# Patient Record
Sex: Male | Born: 1965 | Race: Black or African American | Hispanic: No | Marital: Single | Smoking: Never smoker
Health system: Southern US, Community
[De-identification: ages and names within clinical notes are randomized; demographics above are authoritative.]

## PROBLEM LIST (undated history)

## (undated) DIAGNOSIS — Z7901 Long term (current) use of anticoagulants: Secondary | ICD-10-CM

## (undated) DIAGNOSIS — IMO0001 Reserved for inherently not codable concepts without codable children: Secondary | ICD-10-CM

## (undated) DIAGNOSIS — N186 End stage renal disease: Secondary | ICD-10-CM

## (undated) DIAGNOSIS — E1165 Type 2 diabetes mellitus with hyperglycemia: Secondary | ICD-10-CM

## (undated) DIAGNOSIS — Z87442 Personal history of urinary calculi: Secondary | ICD-10-CM

## (undated) DIAGNOSIS — N200 Calculus of kidney: Secondary | ICD-10-CM

## (undated) DIAGNOSIS — I517 Cardiomegaly: Secondary | ICD-10-CM

## (undated) DIAGNOSIS — K219 Gastro-esophageal reflux disease without esophagitis: Secondary | ICD-10-CM

## (undated) DIAGNOSIS — T827XXA Infection and inflammatory reaction due to other cardiac and vascular devices, implants and grafts, initial encounter: Secondary | ICD-10-CM

## (undated) DIAGNOSIS — N2581 Secondary hyperparathyroidism of renal origin: Secondary | ICD-10-CM

## (undated) DIAGNOSIS — R4020 Unspecified coma: Secondary | ICD-10-CM

## (undated) DIAGNOSIS — M109 Gout, unspecified: Secondary | ICD-10-CM

## (undated) DIAGNOSIS — J189 Pneumonia, unspecified organism: Secondary | ICD-10-CM

## (undated) DIAGNOSIS — L02413 Cutaneous abscess of right upper limb: Secondary | ICD-10-CM

## (undated) DIAGNOSIS — F419 Anxiety disorder, unspecified: Secondary | ICD-10-CM

## (undated) DIAGNOSIS — R519 Headache, unspecified: Secondary | ICD-10-CM

## (undated) DIAGNOSIS — I82409 Acute embolism and thrombosis of unspecified deep veins of unspecified lower extremity: Secondary | ICD-10-CM

## (undated) DIAGNOSIS — D649 Anemia, unspecified: Secondary | ICD-10-CM

## (undated) DIAGNOSIS — IMO0002 Reserved for concepts with insufficient information to code with codable children: Secondary | ICD-10-CM

## (undated) DIAGNOSIS — R51 Headache: Secondary | ICD-10-CM

## (undated) DIAGNOSIS — G4733 Obstructive sleep apnea (adult) (pediatric): Secondary | ICD-10-CM

## (undated) DIAGNOSIS — K861 Other chronic pancreatitis: Secondary | ICD-10-CM

## (undated) DIAGNOSIS — A419 Sepsis, unspecified organism: Secondary | ICD-10-CM

## (undated) DIAGNOSIS — R6521 Severe sepsis with septic shock: Secondary | ICD-10-CM

## (undated) DIAGNOSIS — I10 Essential (primary) hypertension: Secondary | ICD-10-CM

## (undated) HISTORY — PX: AV FISTULA PLACEMENT: SHX1204

## (undated) HISTORY — DX: Morbid (severe) obesity due to excess calories: E66.01

## (undated) HISTORY — DX: Gout, unspecified: M10.9

## (undated) HISTORY — DX: End stage renal disease: N18.6

## (undated) HISTORY — DX: Essential (primary) hypertension: I10

## (undated) HISTORY — DX: Unspecified coma: R40.20

## (undated) HISTORY — DX: Sepsis, unspecified organism: A41.9

## (undated) HISTORY — DX: Anemia, unspecified: D64.9

## (undated) HISTORY — DX: Other chronic pancreatitis: K86.1

## (undated) HISTORY — PX: AMPUTATION FINGER / THUMB: SUR24

## (undated) HISTORY — DX: Secondary hyperparathyroidism of renal origin: N25.81

## (undated) HISTORY — DX: Severe sepsis with septic shock: R65.21

## (undated) HISTORY — DX: Long term (current) use of anticoagulants: Z79.01

## (undated) HISTORY — DX: Cardiomegaly: I51.7

## (undated) HISTORY — DX: Obstructive sleep apnea (adult) (pediatric): G47.33

## (undated) HISTORY — DX: Acute embolism and thrombosis of unspecified deep veins of unspecified lower extremity: I82.409

---

## 2009-10-05 DIAGNOSIS — I82409 Acute embolism and thrombosis of unspecified deep veins of unspecified lower extremity: Secondary | ICD-10-CM

## 2009-10-05 HISTORY — DX: Acute embolism and thrombosis of unspecified deep veins of unspecified lower extremity: I82.409

## 2009-11-01 ENCOUNTER — Ambulatory Visit: Payer: Self-pay | Admitting: Internal Medicine

## 2009-11-01 ENCOUNTER — Inpatient Hospital Stay (HOSPITAL_COMMUNITY): Admission: EM | Admit: 2009-11-01 | Discharge: 2009-12-21 | Payer: Self-pay | Admitting: Emergency Medicine

## 2009-11-04 ENCOUNTER — Ambulatory Visit: Payer: Self-pay | Admitting: Vascular Surgery

## 2009-11-04 ENCOUNTER — Encounter (INDEPENDENT_AMBULATORY_CARE_PROVIDER_SITE_OTHER): Payer: Self-pay | Admitting: Pulmonary Disease

## 2009-11-05 ENCOUNTER — Encounter: Payer: Self-pay | Admitting: Emergency Medicine

## 2009-11-27 ENCOUNTER — Encounter (INDEPENDENT_AMBULATORY_CARE_PROVIDER_SITE_OTHER): Payer: Self-pay | Admitting: Pulmonary Disease

## 2009-11-29 ENCOUNTER — Ambulatory Visit: Payer: Self-pay | Admitting: Physical Medicine & Rehabilitation

## 2009-12-04 ENCOUNTER — Ambulatory Visit: Payer: Self-pay | Admitting: Internal Medicine

## 2009-12-20 ENCOUNTER — Encounter: Payer: Self-pay | Admitting: Family Medicine

## 2009-12-20 ENCOUNTER — Ambulatory Visit: Payer: Self-pay | Admitting: Vascular Surgery

## 2010-01-07 ENCOUNTER — Encounter (INDEPENDENT_AMBULATORY_CARE_PROVIDER_SITE_OTHER): Payer: Self-pay | Admitting: Internal Medicine

## 2010-01-07 ENCOUNTER — Ambulatory Visit: Payer: Self-pay | Admitting: Vascular Surgery

## 2010-01-07 ENCOUNTER — Ambulatory Visit: Payer: Self-pay | Admitting: Pulmonary Disease

## 2010-01-07 ENCOUNTER — Ambulatory Visit: Payer: Self-pay | Admitting: Hematology and Oncology

## 2010-01-07 ENCOUNTER — Inpatient Hospital Stay (HOSPITAL_COMMUNITY): Admission: EM | Admit: 2010-01-07 | Discharge: 2010-01-14 | Payer: Self-pay | Admitting: Emergency Medicine

## 2010-01-12 ENCOUNTER — Encounter: Payer: Self-pay | Admitting: Vascular Surgery

## 2010-01-14 ENCOUNTER — Ambulatory Visit: Payer: Self-pay | Admitting: Hematology and Oncology

## 2010-02-07 ENCOUNTER — Ambulatory Visit (HOSPITAL_COMMUNITY)
Admission: RE | Admit: 2010-02-07 | Discharge: 2010-02-07 | Payer: Self-pay | Source: Home / Self Care | Admitting: Vascular Surgery

## 2010-02-07 ENCOUNTER — Ambulatory Visit: Payer: Self-pay | Admitting: Vascular Surgery

## 2010-03-10 ENCOUNTER — Ambulatory Visit: Payer: Self-pay | Admitting: Hematology and Oncology

## 2010-03-25 ENCOUNTER — Ambulatory Visit: Payer: Self-pay | Admitting: Vascular Surgery

## 2010-04-09 ENCOUNTER — Encounter (INDEPENDENT_AMBULATORY_CARE_PROVIDER_SITE_OTHER): Payer: Self-pay | Admitting: Nurse Practitioner

## 2010-04-10 ENCOUNTER — Encounter (HOSPITAL_BASED_OUTPATIENT_CLINIC_OR_DEPARTMENT_OTHER): Admission: RE | Admit: 2010-04-10 | Discharge: 2010-05-29 | Payer: Self-pay | Admitting: General Surgery

## 2010-05-26 ENCOUNTER — Ambulatory Visit: Payer: Self-pay | Admitting: Vascular Surgery

## 2010-05-26 ENCOUNTER — Ambulatory Visit (HOSPITAL_COMMUNITY): Admission: RE | Admit: 2010-05-26 | Discharge: 2010-05-26 | Payer: Self-pay | Admitting: Vascular Surgery

## 2010-06-06 ENCOUNTER — Encounter: Admission: RE | Admit: 2010-06-06 | Discharge: 2010-07-02 | Payer: Self-pay | Admitting: Orthopedic Surgery

## 2010-06-30 ENCOUNTER — Emergency Department (HOSPITAL_COMMUNITY): Admission: EM | Admit: 2010-06-30 | Discharge: 2010-06-30 | Payer: Self-pay | Admitting: Emergency Medicine

## 2010-07-07 ENCOUNTER — Emergency Department (HOSPITAL_COMMUNITY): Admission: EM | Admit: 2010-07-07 | Discharge: 2010-07-07 | Payer: Self-pay | Admitting: Emergency Medicine

## 2010-07-09 ENCOUNTER — Encounter: Admission: RE | Admit: 2010-07-09 | Discharge: 2010-08-26 | Payer: Self-pay | Admitting: Orthopedic Surgery

## 2010-07-23 ENCOUNTER — Ambulatory Visit (HOSPITAL_COMMUNITY): Admission: RE | Admit: 2010-07-23 | Discharge: 2010-07-23 | Payer: Self-pay | Admitting: Nephrology

## 2010-07-28 ENCOUNTER — Ambulatory Visit (HOSPITAL_COMMUNITY): Admission: RE | Admit: 2010-07-28 | Discharge: 2010-07-28 | Payer: Self-pay | Admitting: Nephrology

## 2010-08-22 ENCOUNTER — Ambulatory Visit (HOSPITAL_COMMUNITY)
Admission: RE | Admit: 2010-08-22 | Discharge: 2010-08-22 | Payer: Self-pay | Source: Home / Self Care | Admitting: Nephrology

## 2010-08-26 ENCOUNTER — Ambulatory Visit (HOSPITAL_COMMUNITY)
Admission: RE | Admit: 2010-08-26 | Discharge: 2010-08-26 | Payer: Self-pay | Source: Home / Self Care | Admitting: Nephrology

## 2010-11-03 IMAGING — CR DG CHEST 1V PORT
1 series · 1 of 1 positions shown · non-contrast
Comparison: 11/13/2009.

CLINICAL DATA: Respiratory distress.

PORTABLE CHEST - 1 VIEW

[AP]
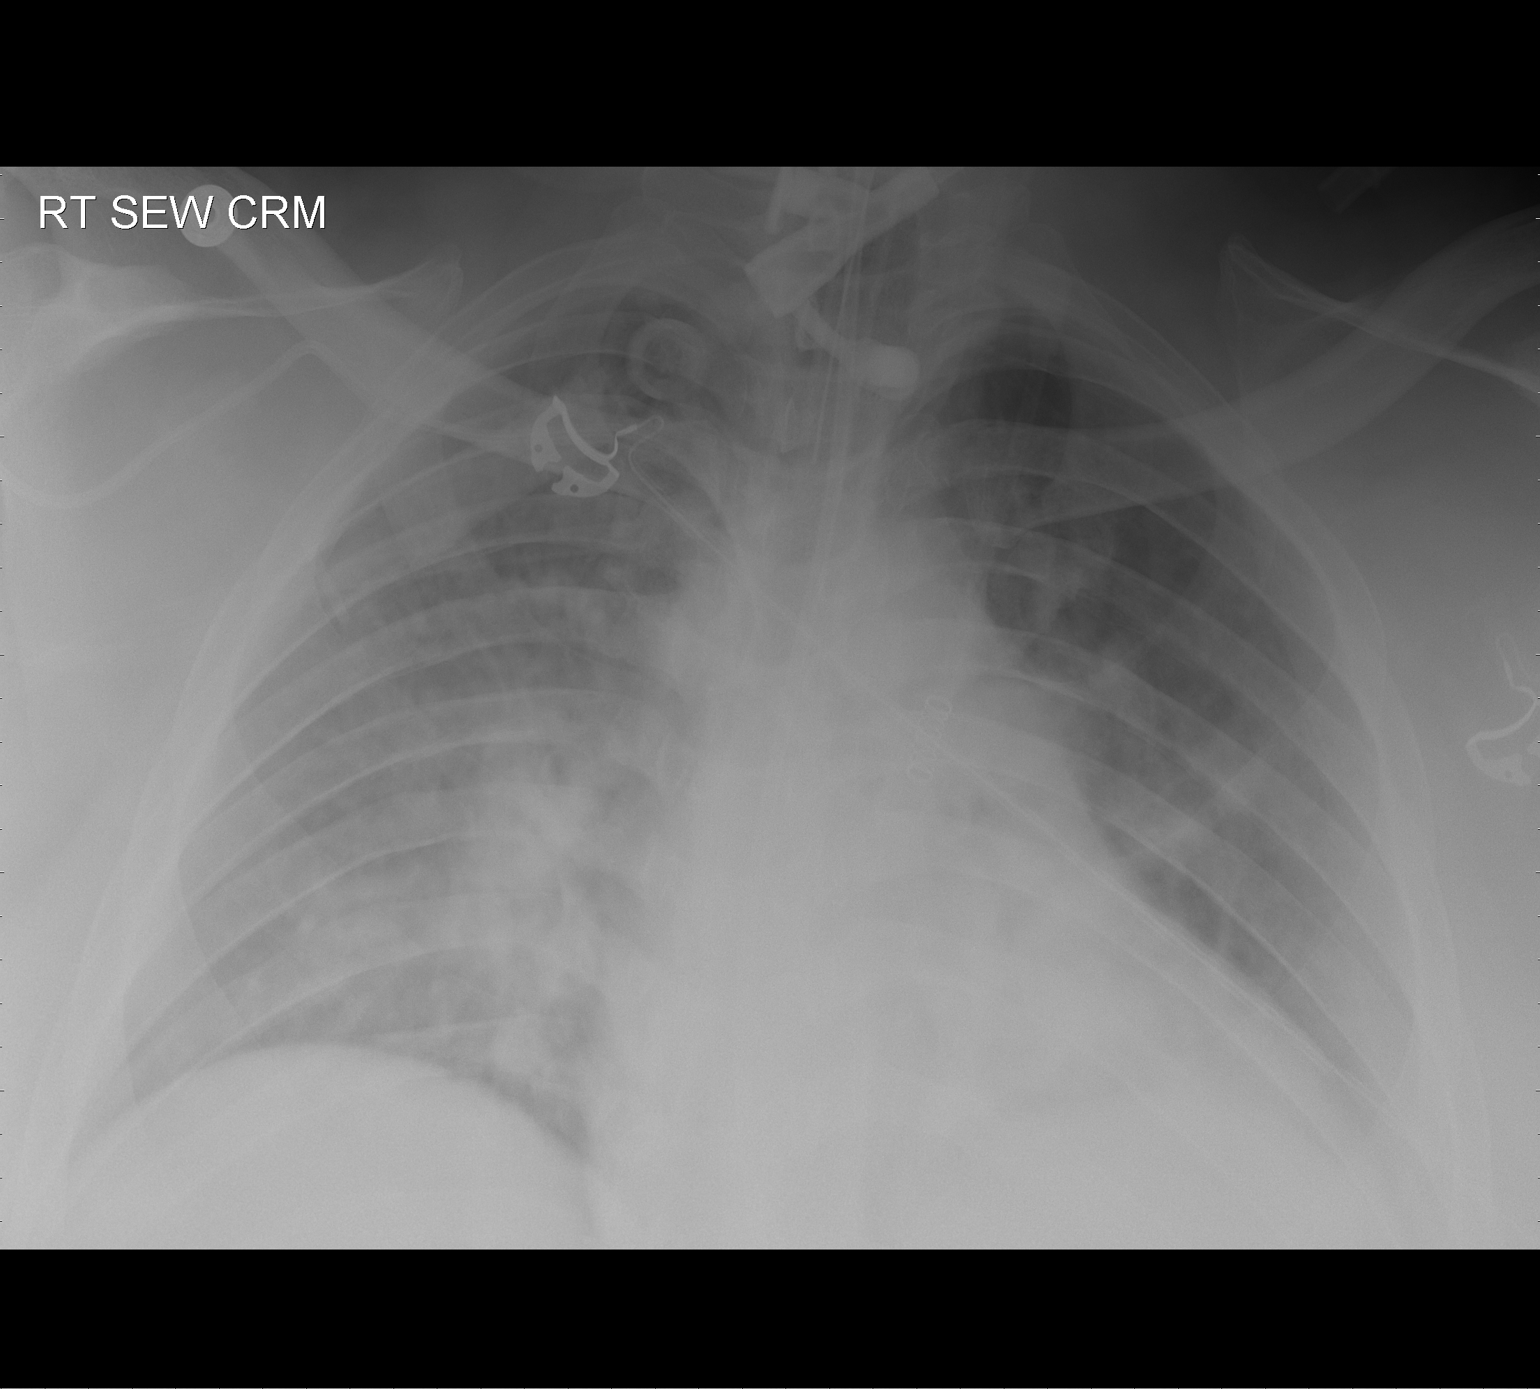

[1 of 1 positions shown; findings below may reference images not displayed]

FINDINGS: Subsegmental atelectasis left midlung zone.  Suspicion
for diffuse airspace disease.  Atelectatic changes right lower lung
zone.

ET tube position satisfactory.  Panda feeding tube is noted
traversing the esophagus.
IMPRESSION: Bilateral atelectatic changes.

## 2010-11-04 IMAGING — CR DG CHEST 1V PORT
1 series · 1 of 1 positions shown · non-contrast
Comparison: T10

CLINICAL DATA: Respiratory arrest.  Tracheostomy.

PORTABLE CHEST - 1 VIEW

[AP]
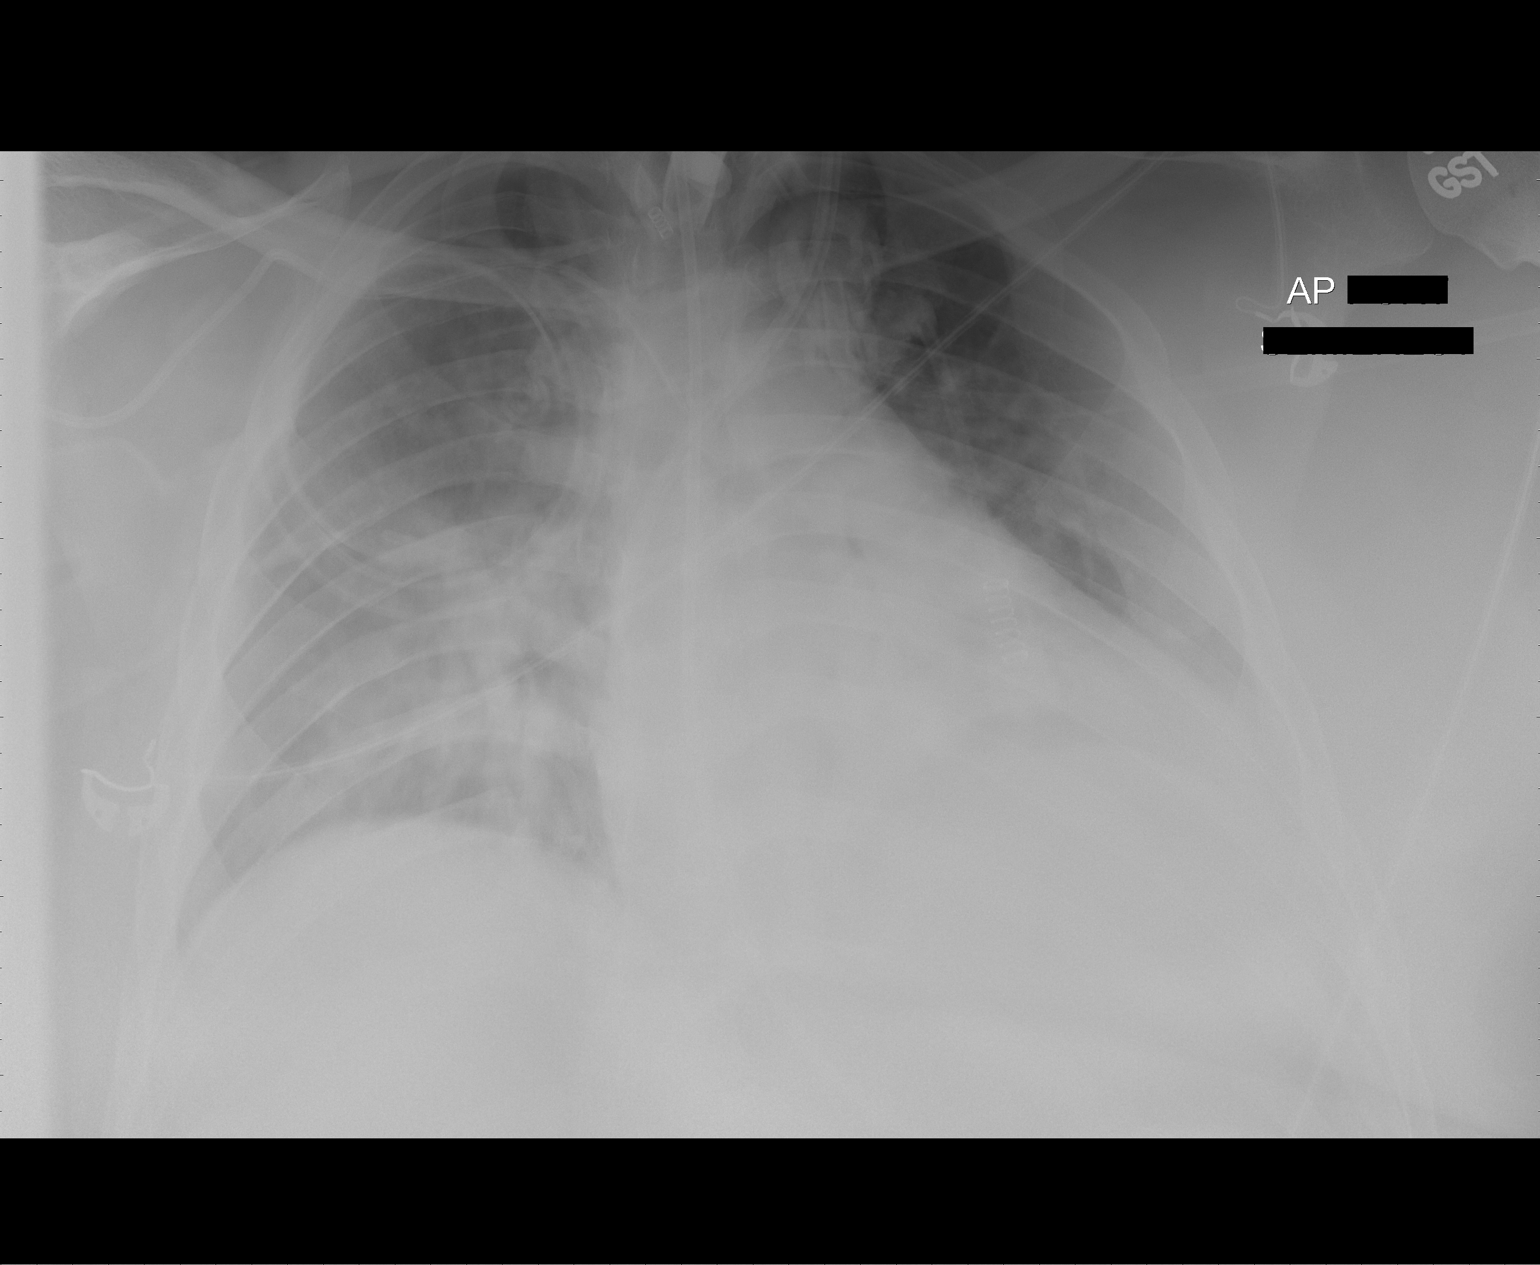

[1 of 1 positions shown; findings below may reference images not displayed]

FINDINGS: Tracheostomy, soft feeding tube and left internal jugular
central line appear the same.  Right subclavian central line is
also unchanged with its tip in the SVC at the azygos level.  The
heart remains enlarged.  Diffuse pulmonary airspace density
persists.  This is little changed.  There may be slightly more
volume loss in the left lower lobe.
IMPRESSION: Persistent areas of pulmonary density bilaterally, little changed
except for possible slight increase in volume loss in the left
lower lobe.

## 2010-11-04 NOTE — Letter (Signed)
Summary: PLAN OF CARE 12/22/09 TO  02/19/10  PLAN OF CARE 12/22/09 TO  02/19/10   Imported By: Silvio Pate Stanislawscyk 04/09/2010 15:17:55  _____________________________________________________________________  External Attachment:    Type:   Image     Comment:   External Document

## 2010-11-06 IMAGING — CR DG CHEST 1V PORT
1 series · 1 of 1 positions shown · non-contrast
Comparison: Earlier film, same date.

CLINICAL DATA: Respiratory arrest.  Central line placement.

PORTABLE CHEST - 1 VIEW

[AP]
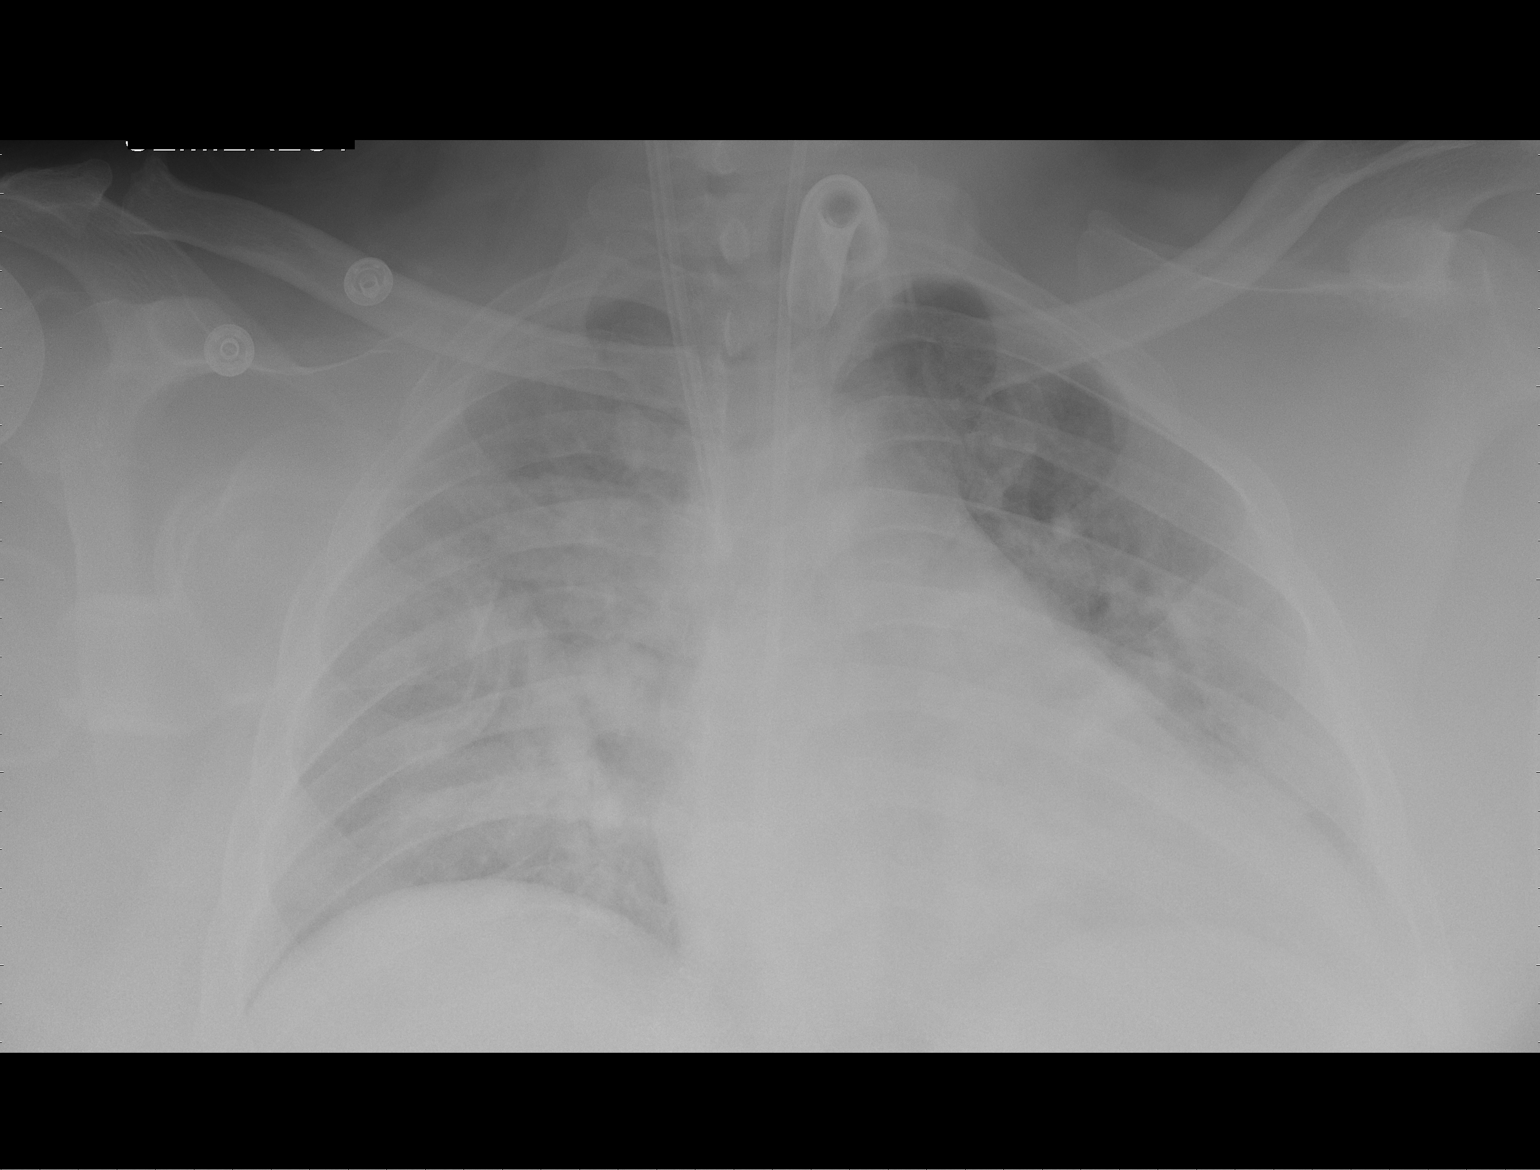

[1 of 1 positions shown; findings below may reference images not displayed]

FINDINGS: The tracheostomy tube and Panda tube are stable.  There
is a new right IJ central venous catheter with its tip in the mid
SVC.  No complicating features are demonstrated.  The heart lungs
are stable with a persistent diffuse airspace process.
IMPRESSION: 1.  Right IJ central venous catheter in good position without
complicating features.
2.  Stable appearance of the heart and lungs since the earlier
film.

## 2010-11-08 IMAGING — CR DG CHEST 1V PORT
1 series · 1 of 1 positions shown · non-contrast
Comparison: Chest radiograph 11/18/2009

CLINICAL DATA: Respiratory arrest

PORTABLE CHEST - 1 VIEW

[AP]
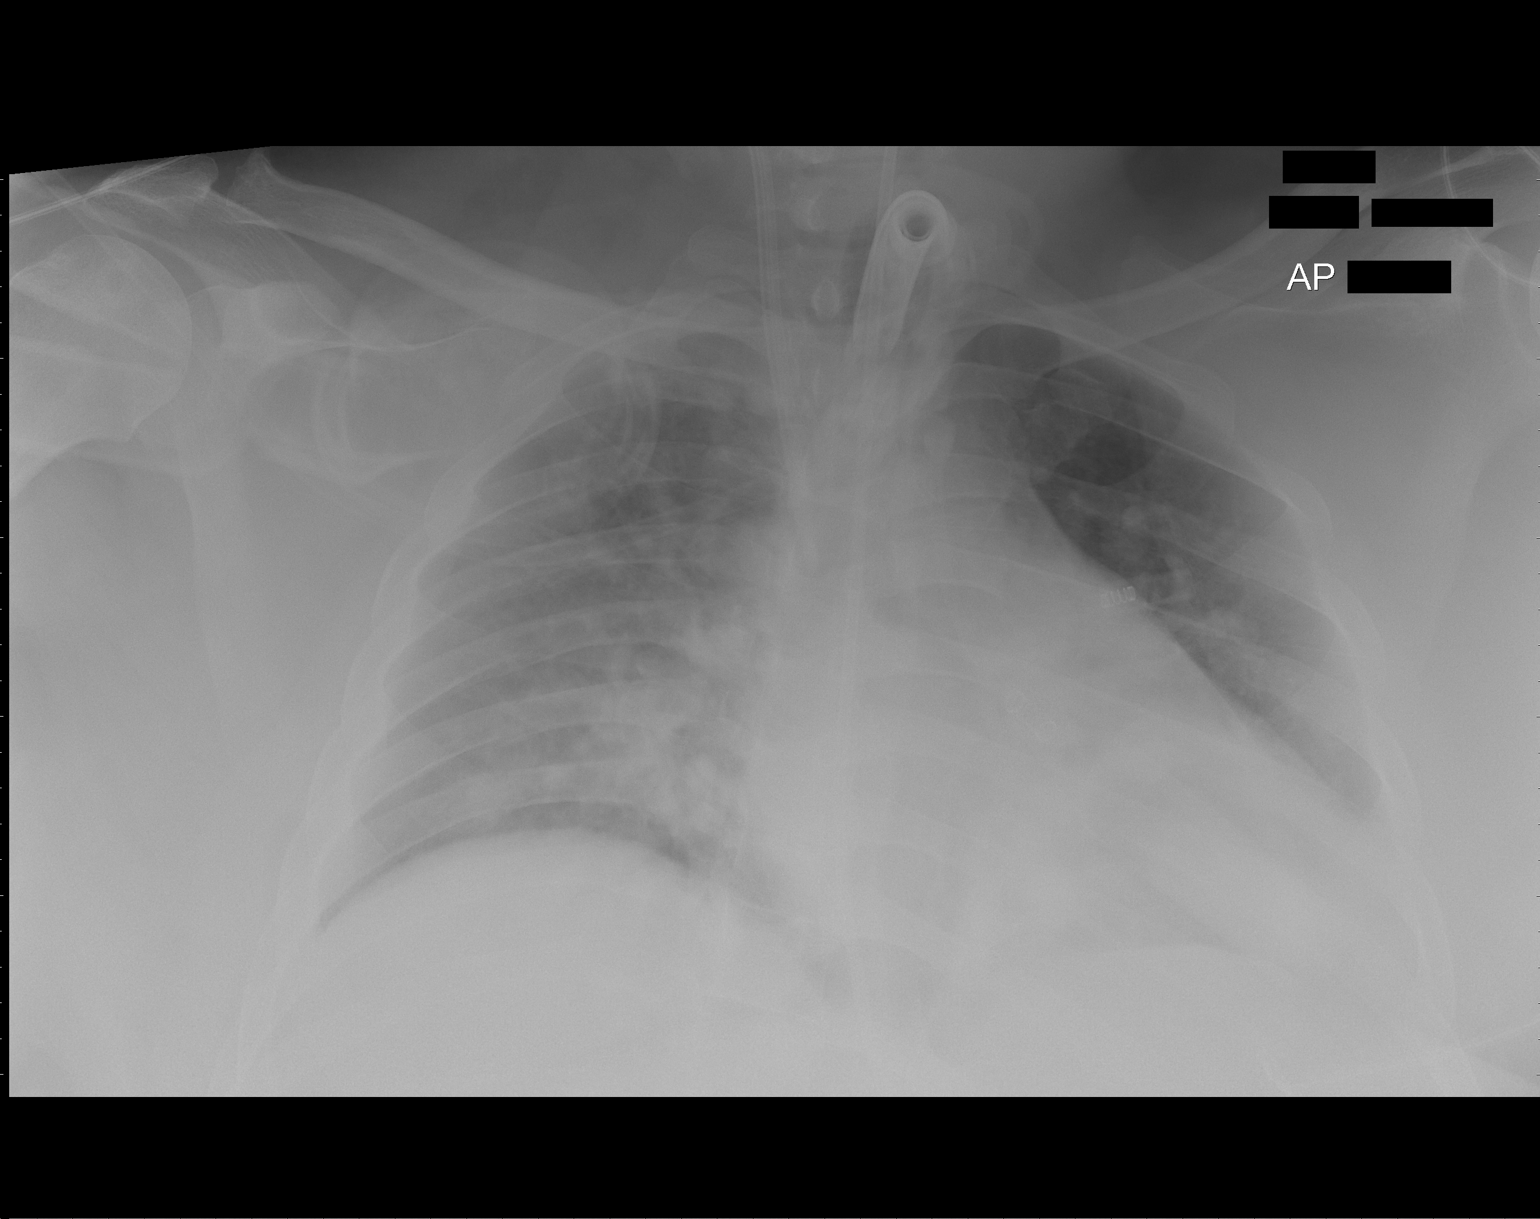

[1 of 1 positions shown; findings below may reference images not displayed]

FINDINGS: Endotracheal tube, feeding tube, and right central venous
line are unchanged.  Stable enlarged cardiac silhouette.  The
perihilar air space opacities are unchanged from prior.
IMPRESSION: 1.  No significant change.

2.  Stable support apparatus.
3.  Cardiomegaly and mild pulmonary edema.

## 2010-11-11 IMAGING — CR DG CHEST 1V PORT
1 series · 1 of 1 positions shown · non-contrast
Comparison: 11/19/2009

CLINICAL DATA: Respiratory distress, tracheostomy.

PORTABLE CHEST - 1 VIEW

[view not recorded]
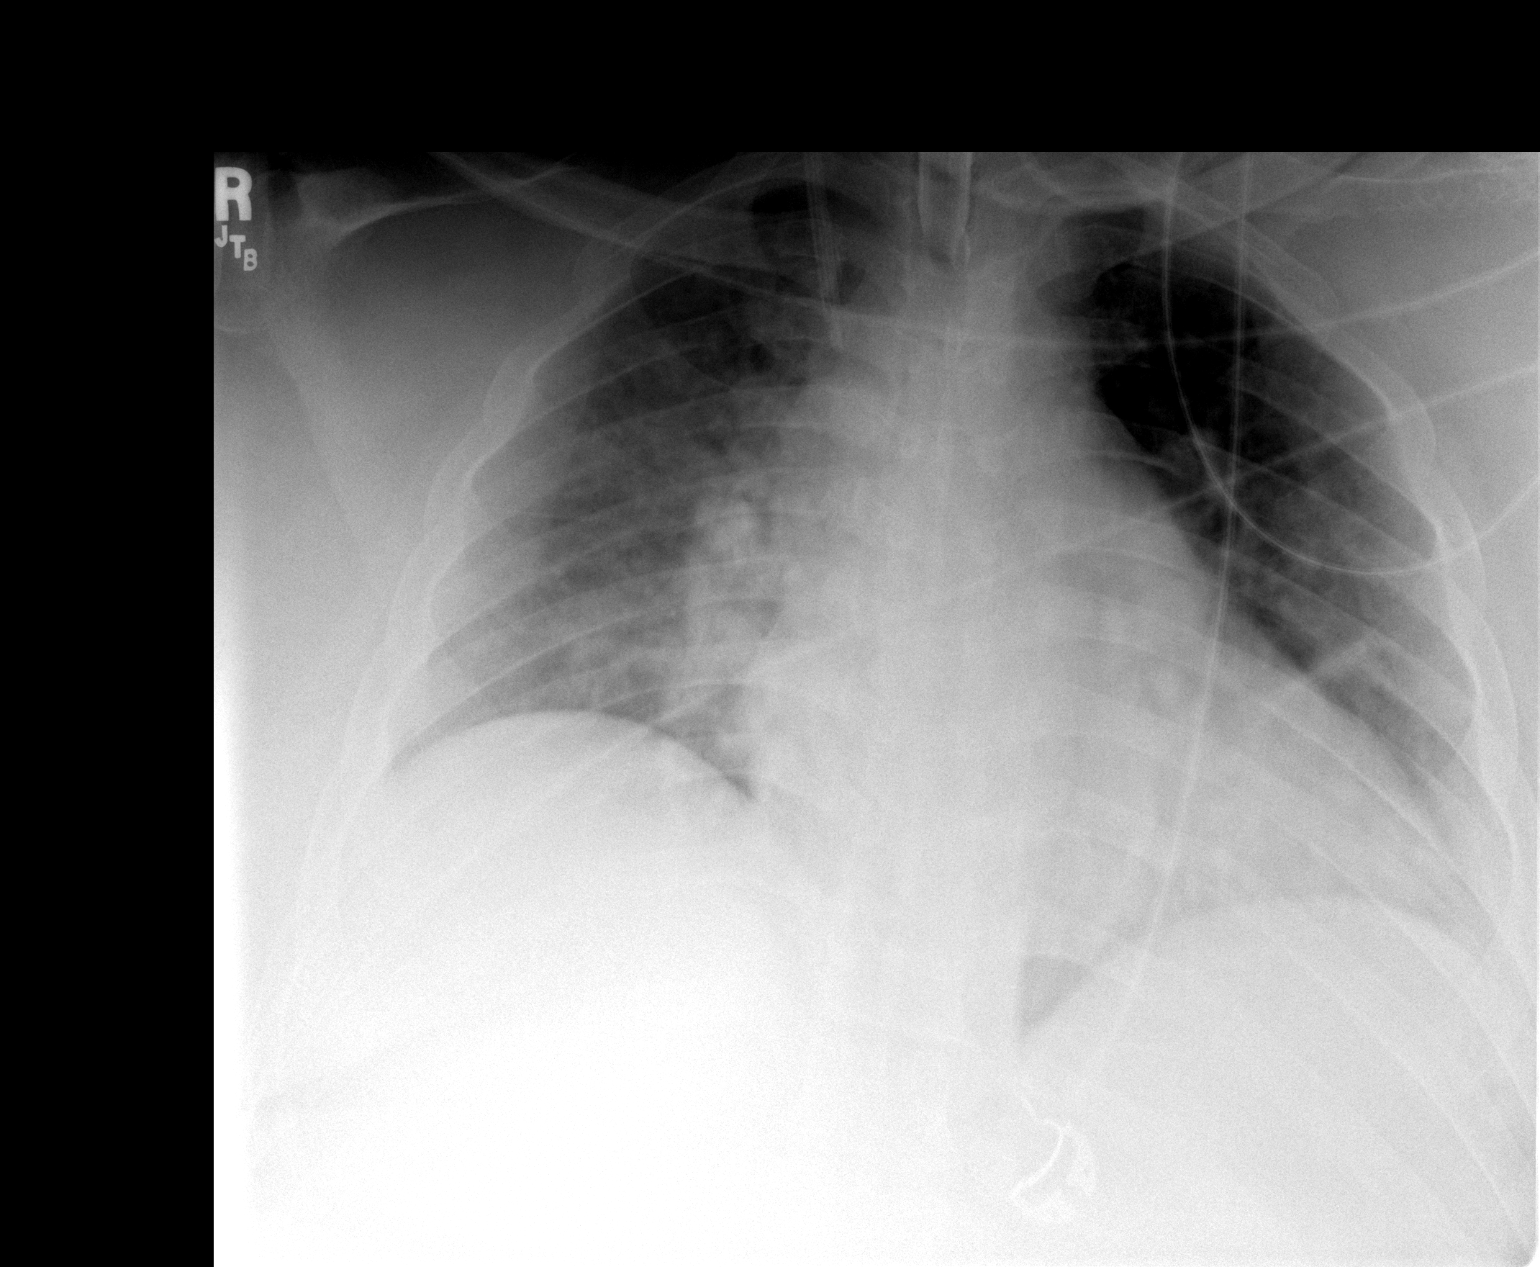

[1 of 1 positions shown; findings below may reference images not displayed]

FINDINGS: Image quality is degraded by motion.  Tracheostomy is
midline.  Right IJ catheter terminates at the junction of the right
internal jugular and right subclavian veins.  Feeding tube has been
removed.  Heart is enlarged.  Lungs are low in volume with mixed
interstitial and airspace disease, stable.
IMPRESSION: Low lung volumes with mixed interstitial and airspace disease,
which may be due to edema.

## 2010-11-21 ENCOUNTER — Other Ambulatory Visit (HOSPITAL_COMMUNITY): Payer: Self-pay | Admitting: Nephrology

## 2010-11-21 DIAGNOSIS — N186 End stage renal disease: Secondary | ICD-10-CM

## 2010-12-05 ENCOUNTER — Ambulatory Visit (HOSPITAL_COMMUNITY)
Admission: RE | Admit: 2010-12-05 | Discharge: 2010-12-05 | Disposition: A | Discharge status: Home / Self Care | Payer: Medicare Other | Source: Ambulatory Visit | Attending: Nephrology | Admitting: Nephrology

## 2010-12-05 ENCOUNTER — Other Ambulatory Visit (HOSPITAL_COMMUNITY): Payer: Self-pay | Admitting: Nephrology

## 2010-12-05 DIAGNOSIS — N186 End stage renal disease: Secondary | ICD-10-CM | POA: Insufficient documentation

## 2010-12-05 DIAGNOSIS — I871 Compression of vein: Secondary | ICD-10-CM | POA: Insufficient documentation

## 2010-12-05 DIAGNOSIS — T82898A Other specified complication of vascular prosthetic devices, implants and grafts, initial encounter: Secondary | ICD-10-CM | POA: Insufficient documentation

## 2010-12-05 DIAGNOSIS — Y832 Surgical operation with anastomosis, bypass or graft as the cause of abnormal reaction of the patient, or of later complication, without mention of misadventure at the time of the procedure: Secondary | ICD-10-CM | POA: Insufficient documentation

## 2010-12-05 MED ORDER — IOHEXOL 300 MG/ML  SOLN
100.0000 mL | Freq: Once | INTRAMUSCULAR | Status: AC | PRN
  Administered 2010-12-05: 50 mL via INTRAVENOUS

## 2010-12-11 ENCOUNTER — Other Ambulatory Visit (HOSPITAL_COMMUNITY): Payer: Self-pay | Admitting: Nephrology

## 2010-12-11 DIAGNOSIS — N186 End stage renal disease: Secondary | ICD-10-CM

## 2010-12-17 ENCOUNTER — Ambulatory Visit (HOSPITAL_COMMUNITY)
Admission: RE | Admit: 2010-12-17 | Discharge: 2010-12-17 | Disposition: A | Discharge status: Home / Self Care | Payer: Medicare Other | Source: Ambulatory Visit | Attending: Nephrology | Admitting: Nephrology

## 2010-12-17 ENCOUNTER — Other Ambulatory Visit (HOSPITAL_COMMUNITY): Payer: Self-pay | Admitting: Nephrology

## 2010-12-17 DIAGNOSIS — Y832 Surgical operation with anastomosis, bypass or graft as the cause of abnormal reaction of the patient, or of later complication, without mention of misadventure at the time of the procedure: Secondary | ICD-10-CM | POA: Insufficient documentation

## 2010-12-17 DIAGNOSIS — N186 End stage renal disease: Secondary | ICD-10-CM | POA: Insufficient documentation

## 2010-12-17 DIAGNOSIS — D649 Anemia, unspecified: Secondary | ICD-10-CM | POA: Insufficient documentation

## 2010-12-17 DIAGNOSIS — I120 Hypertensive chronic kidney disease with stage 5 chronic kidney disease or end stage renal disease: Secondary | ICD-10-CM | POA: Insufficient documentation

## 2010-12-17 DIAGNOSIS — E669 Obesity, unspecified: Secondary | ICD-10-CM | POA: Insufficient documentation

## 2010-12-17 DIAGNOSIS — T82898A Other specified complication of vascular prosthetic devices, implants and grafts, initial encounter: Secondary | ICD-10-CM | POA: Insufficient documentation

## 2010-12-17 DIAGNOSIS — E119 Type 2 diabetes mellitus without complications: Secondary | ICD-10-CM | POA: Insufficient documentation

## 2010-12-17 DIAGNOSIS — G4730 Sleep apnea, unspecified: Secondary | ICD-10-CM | POA: Insufficient documentation

## 2010-12-17 MED ORDER — IOHEXOL 300 MG/ML  SOLN
100.0000 mL | Freq: Once | INTRAMUSCULAR | Status: AC | PRN
  Administered 2010-12-17: 55 mL via INTRAVENOUS

## 2010-12-18 LAB — URINALYSIS, ROUTINE W REFLEX MICROSCOPIC
Bilirubin Urine: NEGATIVE
Leukocytes, UA: NEGATIVE
Nitrite: NEGATIVE
Protein, ur: 100 mg/dL — AB
Specific Gravity, Urine: 1.014 (ref 1.005–1.030)
pH: 7 (ref 5.0–8.0)

## 2010-12-18 LAB — CBC
Hemoglobin: 10.4 g/dL — ABNORMAL LOW (ref 13.0–17.0)
MCH: 31.4 pg (ref 26.0–34.0)
MCHC: 32.2 g/dL (ref 30.0–36.0)
MCV: 97.6 fL (ref 78.0–100.0)
RBC: 3.31 MIL/uL — ABNORMAL LOW (ref 4.22–5.81)

## 2010-12-18 LAB — BASIC METABOLIC PANEL
CO2: 27 mEq/L (ref 19–32)
Calcium: 9.2 mg/dL (ref 8.4–10.5)
Chloride: 103 mEq/L (ref 96–112)
Glucose, Bld: 95 mg/dL (ref 70–99)
Sodium: 140 mEq/L (ref 135–145)

## 2010-12-18 LAB — DIFFERENTIAL
Basophils Relative: 1 % (ref 0–1)
Eosinophils Absolute: 0.2 10*3/uL (ref 0.0–0.7)
Eosinophils Relative: 2 % (ref 0–5)
Lymphs Abs: 3.5 10*3/uL (ref 0.7–4.0)
Monocytes Absolute: 0.8 10*3/uL (ref 0.1–1.0)
Monocytes Relative: 7 % (ref 3–12)
Neutrophils Relative %: 59 % (ref 43–77)

## 2010-12-18 LAB — URINE MICROSCOPIC-ADD ON

## 2010-12-18 LAB — URINE CULTURE: Culture: NO GROWTH

## 2010-12-22 LAB — ACETAMINOPHEN LEVEL: Acetaminophen (Tylenol), Serum: <10 ug/mL — ABNORMAL LOW (ref 10–30)

## 2010-12-22 LAB — POCT I-STAT 3, ART BLOOD GAS (G3+)
Acid-Base Excess: 1 mmol/L (ref 0.0–2.0)
Acid-base deficit: 20 mmol/L — ABNORMAL HIGH (ref 0.0–2.0)
Acid-base deficit: 22 mmol/L — ABNORMAL HIGH (ref 0.0–2.0)
Acid-base deficit: 8 mmol/L — ABNORMAL HIGH (ref 0.0–2.0)
Bicarbonate: 17.9 mEq/L — ABNORMAL LOW (ref 20.0–24.0)
Bicarbonate: 18.5 mEq/L — ABNORMAL LOW (ref 20.0–24.0)
Bicarbonate: 19.8 mEq/L — ABNORMAL LOW (ref 20.0–24.0)
Bicarbonate: 23.8 mEq/L (ref 20.0–24.0)
Bicarbonate: 26 mEq/L — ABNORMAL HIGH (ref 20.0–24.0)
O2 Saturation: 84 %
O2 Saturation: 95 %
Patient temperature: 35.6
Patient temperature: 98.6
Patient temperature: 98.6
TCO2: 20 mmol/L (ref 0–100)
TCO2: 21 mmol/L (ref 0–100)
TCO2: 21 mmol/L (ref 0–100)
pCO2 arterial: 39.2 mmHg (ref 35.0–45.0)
pCO2 arterial: 41.1 mmHg (ref 35.0–45.0)
pCO2 arterial: 41.9 mmHg (ref 35.0–45.0)
pCO2 arterial: 43.2 mmHg (ref 35.0–45.0)
pCO2 arterial: 46.4 mmHg — ABNORMAL HIGH (ref 35.0–45.0)
pH, Arterial: 6.886 — CL (ref 7.350–7.450)
pH, Arterial: 7.135 — CL (ref 7.350–7.450)
pH, Arterial: 7.314 — ABNORMAL LOW (ref 7.350–7.450)
pH, Arterial: 7.318 — ABNORMAL LOW (ref 7.350–7.450)
pH, Arterial: 7.394 (ref 7.350–7.450)
pO2, Arterial: 101 mmHg — ABNORMAL HIGH (ref 80.0–100.0)
pO2, Arterial: 127 mmHg — ABNORMAL HIGH (ref 80.0–100.0)
pO2, Arterial: 199 mmHg — ABNORMAL HIGH (ref 80.0–100.0)
pO2, Arterial: 73 mmHg — ABNORMAL LOW (ref 80.0–100.0)
pO2, Arterial: 88 mmHg (ref 80.0–100.0)

## 2010-12-22 LAB — CBC
HCT: 35.4 % — ABNORMAL LOW (ref 39.0–52.0)
HCT: 37.2 % — ABNORMAL LOW (ref 39.0–52.0)
HCT: 40 % (ref 39.0–52.0)
Hemoglobin: 11.6 g/dL — ABNORMAL LOW (ref 13.0–17.0)
Hemoglobin: 12.3 g/dL — ABNORMAL LOW (ref 13.0–17.0)
Hemoglobin: 13.6 g/dL (ref 13.0–17.0)
MCHC: 28.5 g/dL — ABNORMAL LOW (ref 30.0–36.0)
MCHC: 32.4 g/dL (ref 30.0–36.0)
MCHC: 33 g/dL (ref 30.0–36.0)
MCV: 96.4 fL (ref 78.0–100.0)
Platelets: 138 10*3/uL — ABNORMAL LOW (ref 150–400)
Platelets: 53 10*3/uL — ABNORMAL LOW (ref 150–400)
RBC: 4.25 MIL/uL (ref 4.22–5.81)
RDW: 15.1 % (ref 11.5–15.5)
RDW: 15.8 % — ABNORMAL HIGH (ref 11.5–15.5)
RDW: 15.8 % — ABNORMAL HIGH (ref 11.5–15.5)

## 2010-12-22 LAB — POCT I-STAT, CHEM 8
Calcium, Ion: 1.12 mmol/L (ref 1.12–1.32)
Chloride: 119 mEq/L — ABNORMAL HIGH (ref 96–112)
Creatinine, Ser: 5 mg/dL — ABNORMAL HIGH (ref 0.4–1.5)
Glucose, Bld: >700 mg/dL (ref 70–99)
Potassium: 5.7 mEq/L — ABNORMAL HIGH (ref 3.5–5.1)

## 2010-12-22 LAB — CARDIAC PANEL(CRET KIN+CKTOT+MB+TROPI)
CK, MB: 7.4 ng/mL (ref 0.3–4.0)
CK, MB: 8.7 ng/mL (ref 0.3–4.0)
CK, MB: 9.9 ng/mL (ref 0.3–4.0)
Relative Index: 0.3 (ref 0.0–2.5)
Relative Index: 0.6 (ref 0.0–2.5)
Total CK: 1341 U/L — ABNORMAL HIGH (ref 7–232)
Total CK: 649 U/L — ABNORMAL HIGH (ref 7–232)
Troponin I: 0.1 ng/mL — ABNORMAL HIGH (ref 0.00–0.06)
Troponin I: 0.1 ng/mL — ABNORMAL HIGH (ref 0.00–0.06)

## 2010-12-22 LAB — RAPID URINE DRUG SCREEN, HOSP PERFORMED
Amphetamines: NOT DETECTED
Opiates: NOT DETECTED
Tetrahydrocannabinol: NOT DETECTED

## 2010-12-22 LAB — SALICYLATE LEVEL: Salicylate Lvl: <4 mg/dL (ref 2.8–20.0)

## 2010-12-22 LAB — D-DIMER, QUANTITATIVE: D-Dimer, Quant: 6.28 ug/mL-FEU — ABNORMAL HIGH (ref 0.00–0.48)

## 2010-12-22 LAB — GLUCOSE, CAPILLARY
Glucose-Capillary: 118 mg/dL — ABNORMAL HIGH (ref 70–99)
Glucose-Capillary: 121 mg/dL — ABNORMAL HIGH (ref 70–99)
Glucose-Capillary: 131 mg/dL — ABNORMAL HIGH (ref 70–99)
Glucose-Capillary: 142 mg/dL — ABNORMAL HIGH (ref 70–99)
Glucose-Capillary: 142 mg/dL — ABNORMAL HIGH (ref 70–99)
Glucose-Capillary: 147 mg/dL — ABNORMAL HIGH (ref 70–99)
Glucose-Capillary: 150 mg/dL — ABNORMAL HIGH (ref 70–99)
Glucose-Capillary: 151 mg/dL — ABNORMAL HIGH (ref 70–99)
Glucose-Capillary: 153 mg/dL — ABNORMAL HIGH (ref 70–99)
Glucose-Capillary: 156 mg/dL — ABNORMAL HIGH (ref 70–99)
Glucose-Capillary: 159 mg/dL — ABNORMAL HIGH (ref 70–99)
Glucose-Capillary: 160 mg/dL — ABNORMAL HIGH (ref 70–99)
Glucose-Capillary: 163 mg/dL — ABNORMAL HIGH (ref 70–99)
Glucose-Capillary: 163 mg/dL — ABNORMAL HIGH (ref 70–99)
Glucose-Capillary: 164 mg/dL — ABNORMAL HIGH (ref 70–99)
Glucose-Capillary: 164 mg/dL — ABNORMAL HIGH (ref 70–99)
Glucose-Capillary: 165 mg/dL — ABNORMAL HIGH (ref 70–99)
Glucose-Capillary: 167 mg/dL — ABNORMAL HIGH (ref 70–99)
Glucose-Capillary: 170 mg/dL — ABNORMAL HIGH (ref 70–99)
Glucose-Capillary: 172 mg/dL — ABNORMAL HIGH (ref 70–99)
Glucose-Capillary: 177 mg/dL — ABNORMAL HIGH (ref 70–99)
Glucose-Capillary: 184 mg/dL — ABNORMAL HIGH (ref 70–99)
Glucose-Capillary: 184 mg/dL — ABNORMAL HIGH (ref 70–99)
Glucose-Capillary: 197 mg/dL — ABNORMAL HIGH (ref 70–99)
Glucose-Capillary: 210 mg/dL — ABNORMAL HIGH (ref 70–99)
Glucose-Capillary: 213 mg/dL — ABNORMAL HIGH (ref 70–99)
Glucose-Capillary: 214 mg/dL — ABNORMAL HIGH (ref 70–99)
Glucose-Capillary: 216 mg/dL — ABNORMAL HIGH (ref 70–99)
Glucose-Capillary: 251 mg/dL — ABNORMAL HIGH (ref 70–99)
Glucose-Capillary: 258 mg/dL — ABNORMAL HIGH (ref 70–99)
Glucose-Capillary: 280 mg/dL — ABNORMAL HIGH (ref 70–99)
Glucose-Capillary: 296 mg/dL — ABNORMAL HIGH (ref 70–99)
Glucose-Capillary: 337 mg/dL — ABNORMAL HIGH (ref 70–99)
Glucose-Capillary: 499 mg/dL — ABNORMAL HIGH (ref 70–99)
Glucose-Capillary: 507 mg/dL — ABNORMAL HIGH (ref 70–99)
Glucose-Capillary: 544 mg/dL — ABNORMAL HIGH (ref 70–99)
Glucose-Capillary: >600 mg/dL (ref 70–99)
Glucose-Capillary: >600 mg/dL (ref 70–99)
Glucose-Capillary: >600 mg/dL (ref 70–99)
Glucose-Capillary: >600 mg/dL (ref 70–99)

## 2010-12-22 LAB — CARBOXYHEMOGLOBIN
Carboxyhemoglobin: 1.7 % — ABNORMAL HIGH (ref 0.5–1.5)
O2 Saturation: 90 %
Total hemoglobin: 12.8 g/dL — ABNORMAL LOW (ref 13.5–18.0)

## 2010-12-22 LAB — BASIC METABOLIC PANEL
BUN: 74 mg/dL — ABNORMAL HIGH (ref 6–23)
BUN: 74 mg/dL — ABNORMAL HIGH (ref 6–23)
BUN: 76 mg/dL — ABNORMAL HIGH (ref 6–23)
BUN: 78 mg/dL — ABNORMAL HIGH (ref 6–23)
CO2: 10 mEq/L — ABNORMAL LOW (ref 19–32)
CO2: 18 mEq/L — ABNORMAL LOW (ref 19–32)
CO2: 19 mEq/L (ref 19–32)
Calcium: 7.1 mg/dL — ABNORMAL LOW (ref 8.4–10.5)
Calcium: 7.5 mg/dL — ABNORMAL LOW (ref 8.4–10.5)
Calcium: 8 mg/dL — ABNORMAL LOW (ref 8.4–10.5)
Chloride: 117 mEq/L — ABNORMAL HIGH (ref 96–112)
Chloride: 118 mEq/L — ABNORMAL HIGH (ref 96–112)
Chloride: 126 mEq/L — ABNORMAL HIGH (ref 96–112)
Creatinine, Ser: 5.76 mg/dL — ABNORMAL HIGH (ref 0.4–1.5)
Creatinine, Ser: 6.47 mg/dL — ABNORMAL HIGH (ref 0.4–1.5)
Creatinine, Ser: 6.68 mg/dL — ABNORMAL HIGH (ref 0.4–1.5)
Creatinine, Ser: 8.34 mg/dL — ABNORMAL HIGH (ref 0.4–1.5)
GFR calc Af Amer: 10 mL/min — ABNORMAL LOW (ref >60)
GFR calc Af Amer: 11 mL/min — ABNORMAL LOW (ref >60)
GFR calc Af Amer: 11 mL/min — ABNORMAL LOW (ref >60)
GFR calc Af Amer: 13 mL/min — ABNORMAL LOW (ref >60)
GFR calc Af Amer: 9 mL/min — ABNORMAL LOW (ref >60)
GFR calc non Af Amer: 10 mL/min — ABNORMAL LOW (ref >60)
GFR calc non Af Amer: 11 mL/min — ABNORMAL LOW (ref >60)
GFR calc non Af Amer: 7 mL/min — ABNORMAL LOW (ref >60)
GFR calc non Af Amer: 7 mL/min — ABNORMAL LOW (ref >60)
GFR calc non Af Amer: 9 mL/min — ABNORMAL LOW (ref >60)
Glucose, Bld: 1237 mg/dL (ref 70–99)
Glucose, Bld: 160 mg/dL — ABNORMAL HIGH (ref 70–99)
Glucose, Bld: 241 mg/dL — ABNORMAL HIGH (ref 70–99)
Glucose, Bld: 637 mg/dL (ref 70–99)
Potassium: 4.2 mEq/L (ref 3.5–5.1)
Potassium: 4.3 mEq/L (ref 3.5–5.1)
Potassium: 4.4 mEq/L (ref 3.5–5.1)
Sodium: 133 mEq/L — ABNORMAL LOW (ref 135–145)
Sodium: 138 mEq/L (ref 135–145)
Sodium: 139 mEq/L (ref 135–145)
Sodium: 150 mEq/L — ABNORMAL HIGH (ref 135–145)
Sodium: 151 mEq/L — ABNORMAL HIGH (ref 135–145)

## 2010-12-22 LAB — RENAL FUNCTION PANEL
Albumin: 2.3 g/dL — ABNORMAL LOW (ref 3.5–5.2)
Albumin: 2.4 g/dL — ABNORMAL LOW (ref 3.5–5.2)
Albumin: 2.5 g/dL — ABNORMAL LOW (ref 3.5–5.2)
BUN: 64 mg/dL — ABNORMAL HIGH (ref 6–23)
BUN: 68 mg/dL — ABNORMAL HIGH (ref 6–23)
BUN: 75 mg/dL — ABNORMAL HIGH (ref 6–23)
CO2: 20 mEq/L (ref 19–32)
CO2: 27 mEq/L (ref 19–32)
Calcium: 7.4 mg/dL — ABNORMAL LOW (ref 8.4–10.5)
Chloride: 116 mEq/L — ABNORMAL HIGH (ref 96–112)
Chloride: 127 mEq/L — ABNORMAL HIGH (ref 96–112)
Chloride: 99 mEq/L (ref 96–112)
Creatinine, Ser: 8.4 mg/dL — ABNORMAL HIGH (ref 0.4–1.5)
Glucose, Bld: 162 mg/dL — ABNORMAL HIGH (ref 70–99)
Glucose, Bld: 163 mg/dL — ABNORMAL HIGH (ref 70–99)
Glucose, Bld: 187 mg/dL — ABNORMAL HIGH (ref 70–99)
Phosphorus: 1.6 mg/dL — ABNORMAL LOW (ref 2.3–4.6)
Phosphorus: 5.9 mg/dL — ABNORMAL HIGH (ref 2.3–4.6)
Potassium: 3.8 mEq/L (ref 3.5–5.1)
Potassium: 4.4 mEq/L (ref 3.5–5.1)
Sodium: 139 mEq/L (ref 135–145)
Sodium: 158 mEq/L — ABNORMAL HIGH (ref 135–145)

## 2010-12-22 LAB — DIFFERENTIAL
Basophils Absolute: 0 10*3/uL (ref 0.0–0.1)
Basophils Absolute: 0.2 10*3/uL — ABNORMAL HIGH (ref 0.0–0.1)
Eosinophils Absolute: 0 10*3/uL (ref 0.0–0.7)
Eosinophils Absolute: 0 10*3/uL (ref 0.0–0.7)
Eosinophils Relative: 0 % (ref 0–5)
Lymphocytes Relative: 9 % — ABNORMAL LOW (ref 12–46)
Lymphs Abs: 1.5 10*3/uL (ref 0.7–4.0)
Monocytes Relative: 9 % (ref 3–12)
Neutro Abs: 13.6 10*3/uL — ABNORMAL HIGH (ref 1.7–7.7)

## 2010-12-22 LAB — URINALYSIS, ROUTINE W REFLEX MICROSCOPIC
Leukocytes, UA: NEGATIVE
Nitrite: NEGATIVE
Specific Gravity, Urine: 1.03 (ref 1.005–1.030)
pH: 5 (ref 5.0–8.0)

## 2010-12-22 LAB — HEPATIC FUNCTION PANEL
AST: 23 U/L (ref 0–37)
Bilirubin, Direct: 0.1 mg/dL (ref 0.0–0.3)
Indirect Bilirubin: 1 mg/dL — ABNORMAL HIGH (ref 0.3–0.9)
Total Protein: 6.8 g/dL (ref 6.0–8.3)

## 2010-12-22 LAB — HEPARIN LEVEL (UNFRACTIONATED): Heparin Unfractionated: 0.53 IU/mL (ref 0.30–0.70)

## 2010-12-22 LAB — TRIGLYCERIDES: Triglycerides: 462 mg/dL — ABNORMAL HIGH (ref <150)

## 2010-12-22 LAB — CULTURE, BLOOD (ROUTINE X 2): Culture: NO GROWTH

## 2010-12-22 LAB — BRAIN NATRIURETIC PEPTIDE: Pro B Natriuretic peptide (BNP): <30 pg/mL (ref 0.0–100.0)

## 2010-12-22 LAB — CULTURE, BAL-QUANTITATIVE W GRAM STAIN

## 2010-12-22 LAB — PROTIME-INR: INR: 1.34 (ref 0.00–1.49)

## 2010-12-22 LAB — TSH: TSH: 1.603 u[IU]/mL (ref 0.350–4.500)

## 2010-12-22 LAB — HEPARIN INDUCED THROMBOCYTOPENIA PNL: Heparin Induced Plt Ab: NEGATIVE

## 2010-12-22 LAB — URINE MICROSCOPIC-ADD ON

## 2010-12-22 LAB — URINE CULTURE: Culture: NO GROWTH

## 2010-12-22 LAB — CK TOTAL AND CKMB (NOT AT ARMC)
CK, MB: 9 ng/mL (ref 0.3–4.0)
Relative Index: 1.6 (ref 0.0–2.5)
Total CK: 578 U/L — ABNORMAL HIGH (ref 7–232)

## 2010-12-22 LAB — KETONES, QUALITATIVE

## 2010-12-22 LAB — APTT
aPTT: 24 seconds (ref 24–37)
aPTT: 51 seconds — ABNORMAL HIGH (ref 24–37)

## 2010-12-22 LAB — LEGIONELLA ANTIGEN, URINE

## 2010-12-22 LAB — CORTISOL: Cortisol, Plasma: 32.9 ug/dL

## 2010-12-22 LAB — ETHANOL: Alcohol, Ethyl (B): <5 mg/dL (ref 0–10)

## 2010-12-22 LAB — LIPASE, BLOOD: Lipase: 2673 U/L — ABNORMAL HIGH (ref 11–59)

## 2010-12-22 LAB — AMYLASE: Amylase: 1434 U/L — ABNORMAL HIGH (ref 0–105)

## 2010-12-23 LAB — POCT I-STAT 4, (NA,K, GLUC, HGB,HCT)
Glucose, Bld: 131 mg/dL — ABNORMAL HIGH (ref 70–99)
HCT: 38 % — ABNORMAL LOW (ref 39.0–52.0)
Hemoglobin: 12.9 g/dL — ABNORMAL LOW (ref 13.0–17.0)

## 2010-12-23 LAB — GLUCOSE, CAPILLARY
Glucose-Capillary: 142 mg/dL — ABNORMAL HIGH (ref 70–99)
Glucose-Capillary: 152 mg/dL — ABNORMAL HIGH (ref 70–99)

## 2010-12-24 LAB — MRSA PCR SCREENING: MRSA by PCR: NEGATIVE

## 2010-12-24 LAB — URINE CULTURE: Colony Count: >=100000

## 2010-12-24 LAB — GLUCOSE, CAPILLARY
Glucose-Capillary: 101 mg/dL — ABNORMAL HIGH (ref 70–99)
Glucose-Capillary: 123 mg/dL — ABNORMAL HIGH (ref 70–99)
Glucose-Capillary: 124 mg/dL — ABNORMAL HIGH (ref 70–99)
Glucose-Capillary: 130 mg/dL — ABNORMAL HIGH (ref 70–99)
Glucose-Capillary: 133 mg/dL — ABNORMAL HIGH (ref 70–99)
Glucose-Capillary: 134 mg/dL — ABNORMAL HIGH (ref 70–99)
Glucose-Capillary: 139 mg/dL — ABNORMAL HIGH (ref 70–99)
Glucose-Capillary: 62 mg/dL — ABNORMAL LOW (ref 70–99)
Glucose-Capillary: 67 mg/dL — ABNORMAL LOW (ref 70–99)
Glucose-Capillary: 77 mg/dL (ref 70–99)
Glucose-Capillary: 79 mg/dL (ref 70–99)
Glucose-Capillary: 81 mg/dL (ref 70–99)
Glucose-Capillary: 93 mg/dL (ref 70–99)
Glucose-Capillary: 96 mg/dL (ref 70–99)
Glucose-Capillary: 98 mg/dL (ref 70–99)
Glucose-Capillary: 99 mg/dL (ref 70–99)

## 2010-12-24 LAB — CBC
HCT: 27.5 % — ABNORMAL LOW (ref 39.0–52.0)
HCT: 30.9 % — ABNORMAL LOW (ref 39.0–52.0)
Hemoglobin: 10 g/dL — ABNORMAL LOW (ref 13.0–17.0)
Hemoglobin: 10.1 g/dL — ABNORMAL LOW (ref 13.0–17.0)
Hemoglobin: 10.3 g/dL — ABNORMAL LOW (ref 13.0–17.0)
Hemoglobin: 10.6 g/dL — ABNORMAL LOW (ref 13.0–17.0)
Hemoglobin: 9.1 g/dL — ABNORMAL LOW (ref 13.0–17.0)
Hemoglobin: 9.8 g/dL — ABNORMAL LOW (ref 13.0–17.0)
Hemoglobin: 9.9 g/dL — ABNORMAL LOW (ref 13.0–17.0)
MCHC: 32.4 g/dL (ref 30.0–36.0)
MCHC: 32.4 g/dL (ref 30.0–36.0)
MCHC: 32.5 g/dL (ref 30.0–36.0)
MCHC: 32.7 g/dL (ref 30.0–36.0)
MCV: 97.3 fL (ref 78.0–100.0)
MCV: 98.4 fL (ref 78.0–100.0)
MCV: 98.5 fL (ref 78.0–100.0)
Platelets: 331 10*3/uL (ref 150–400)
Platelets: 360 10*3/uL (ref 150–400)
Platelets: 361 10*3/uL (ref 150–400)
Platelets: 416 10*3/uL — ABNORMAL HIGH (ref 150–400)
RBC: 2.85 MIL/uL — ABNORMAL LOW (ref 4.22–5.81)
RBC: 3.16 MIL/uL — ABNORMAL LOW (ref 4.22–5.81)
RBC: 3.17 MIL/uL — ABNORMAL LOW (ref 4.22–5.81)
RBC: 3.24 MIL/uL — ABNORMAL LOW (ref 4.22–5.81)
RDW: 18.6 % — ABNORMAL HIGH (ref 11.5–15.5)
RDW: 20.6 % — ABNORMAL HIGH (ref 11.5–15.5)
RDW: 20.6 % — ABNORMAL HIGH (ref 11.5–15.5)
RDW: 20.9 % — ABNORMAL HIGH (ref 11.5–15.5)
RDW: 21.3 % — ABNORMAL HIGH (ref 11.5–15.5)
RDW: 21.6 % — ABNORMAL HIGH (ref 11.5–15.5)
WBC: 10.4 10*3/uL (ref 4.0–10.5)
WBC: 12.1 10*3/uL — ABNORMAL HIGH (ref 4.0–10.5)
WBC: 13.2 10*3/uL — ABNORMAL HIGH (ref 4.0–10.5)

## 2010-12-24 LAB — URINE MICROSCOPIC-ADD ON

## 2010-12-24 LAB — BASIC METABOLIC PANEL
BUN: 26 mg/dL — ABNORMAL HIGH (ref 6–23)
BUN: 34 mg/dL — ABNORMAL HIGH (ref 6–23)
CO2: 23 mEq/L (ref 19–32)
CO2: 23 mEq/L (ref 19–32)
Calcium: 9 mg/dL (ref 8.4–10.5)
Calcium: 9 mg/dL (ref 8.4–10.5)
Calcium: 9.1 mg/dL (ref 8.4–10.5)
Chloride: 101 mEq/L (ref 96–112)
Creatinine, Ser: 4.47 mg/dL — ABNORMAL HIGH (ref 0.4–1.5)
GFR calc Af Amer: 19 mL/min — ABNORMAL LOW (ref >60)
GFR calc Af Amer: 28 mL/min — ABNORMAL LOW (ref >60)
GFR calc non Af Amer: 15 mL/min — ABNORMAL LOW (ref >60)
GFR calc non Af Amer: 16 mL/min — ABNORMAL LOW (ref >60)
GFR calc non Af Amer: 16 mL/min — ABNORMAL LOW (ref >60)
Glucose, Bld: 105 mg/dL — ABNORMAL HIGH (ref 70–99)
Glucose, Bld: 74 mg/dL (ref 70–99)
Sodium: 134 mEq/L — ABNORMAL LOW (ref 135–145)
Sodium: 134 mEq/L — ABNORMAL LOW (ref 135–145)
Sodium: 137 mEq/L (ref 135–145)
Sodium: 137 mEq/L (ref 135–145)

## 2010-12-24 LAB — URINALYSIS, ROUTINE W REFLEX MICROSCOPIC
Glucose, UA: NEGATIVE mg/dL
Ketones, ur: NEGATIVE mg/dL
Protein, ur: >300 mg/dL — AB
Urobilinogen, UA: 1 mg/dL (ref 0.0–1.0)

## 2010-12-24 LAB — ANTIPHOSPHOLIPID SYNDROME EVAL, BLD
Drvvt confirmation: 1.35 Ratio — ABNORMAL HIGH (ref <1.21)
PTTLA 4:1 Mix: 101.2 secs — ABNORMAL HIGH (ref 36.3–48.8)
PTTLA Confirmation: 7.3 secs (ref <8.0)
Phosphatydalserine, IgA: <20 U/mL (ref <20)
Phosphatydalserine, IgM: <25 U/mL (ref <25)
dRVVT Incubated 1:1 Mix: 45.8 secs — ABNORMAL HIGH (ref 36.2–44.3)

## 2010-12-24 LAB — POCT I-STAT, CHEM 8
BUN: 37 mg/dL — ABNORMAL HIGH (ref 6–23)
Calcium, Ion: 1.12 mmol/L (ref 1.12–1.32)
TCO2: 23 mmol/L (ref 0–100)

## 2010-12-24 LAB — POCT CARDIAC MARKERS

## 2010-12-24 LAB — DIFFERENTIAL
Basophils Absolute: 0.1 10*3/uL (ref 0.0–0.1)
Basophils Absolute: 0.4 10*3/uL — ABNORMAL HIGH (ref 0.0–0.1)
Basophils Relative: 1 % (ref 0–1)
Basophils Relative: 3 % — ABNORMAL HIGH (ref 0–1)
Eosinophils Absolute: 0 10*3/uL (ref 0.0–0.7)
Lymphocytes Relative: 31 % (ref 12–46)
Monocytes Absolute: 0.4 10*3/uL (ref 0.1–1.0)
Monocytes Absolute: 0.9 10*3/uL (ref 0.1–1.0)
Neutro Abs: 8 10*3/uL — ABNORMAL HIGH (ref 1.7–7.7)
Neutrophils Relative %: 72 % (ref 43–77)

## 2010-12-24 LAB — RENAL FUNCTION PANEL
Albumin: 2.6 g/dL — ABNORMAL LOW (ref 3.5–5.2)
BUN: 26 mg/dL — ABNORMAL HIGH (ref 6–23)
CO2: 23 mEq/L (ref 19–32)
GFR calc non Af Amer: 16 mL/min — ABNORMAL LOW (ref >60)
Glucose, Bld: 109 mg/dL — ABNORMAL HIGH (ref 70–99)
Phosphorus: 4.2 mg/dL (ref 2.3–4.6)
Phosphorus: 4.9 mg/dL — ABNORMAL HIGH (ref 2.3–4.6)
Potassium: 3.7 mEq/L (ref 3.5–5.1)
Potassium: 3.8 mEq/L (ref 3.5–5.1)
Sodium: 134 mEq/L — ABNORMAL LOW (ref 135–145)
Sodium: 135 mEq/L (ref 135–145)

## 2010-12-24 LAB — HEPARIN LEVEL (UNFRACTIONATED)
Heparin Unfractionated: 0.27 IU/mL — ABNORMAL LOW (ref 0.30–0.70)
Heparin Unfractionated: 0.29 IU/mL — ABNORMAL LOW (ref 0.30–0.70)
Heparin Unfractionated: 0.31 IU/mL (ref 0.30–0.70)
Heparin Unfractionated: 0.4 IU/mL (ref 0.30–0.70)
Heparin Unfractionated: 1.49 IU/mL — ABNORMAL HIGH (ref 0.30–0.70)
Heparin Unfractionated: <0.1 IU/mL — ABNORMAL LOW (ref 0.30–0.70)

## 2010-12-24 LAB — PROTIME-INR
INR: 1.16 (ref 0.00–1.49)
INR: 1.28 (ref 0.00–1.49)
INR: 1.56 — ABNORMAL HIGH (ref 0.00–1.49)
Prothrombin Time: 14.7 seconds (ref 11.6–15.2)
Prothrombin Time: 15.4 seconds — ABNORMAL HIGH (ref 11.6–15.2)
Prothrombin Time: 15.9 seconds — ABNORMAL HIGH (ref 11.6–15.2)
Prothrombin Time: 18.5 seconds — ABNORMAL HIGH (ref 11.6–15.2)
Prothrombin Time: 23.8 seconds — ABNORMAL HIGH (ref 11.6–15.2)
Prothrombin Time: 25.8 seconds — ABNORMAL HIGH (ref 11.6–15.2)

## 2010-12-24 LAB — HOMOCYSTEINE: Homocysteine: 9.4 umol/L (ref 4.0–15.4)

## 2010-12-24 LAB — RAPID URINE DRUG SCREEN, HOSP PERFORMED
Barbiturates: NOT DETECTED
Benzodiazepines: NOT DETECTED

## 2010-12-24 LAB — HEMOGLOBIN A1C: Mean Plasma Glucose: 97 mg/dL

## 2010-12-24 LAB — D-DIMER, QUANTITATIVE: D-Dimer, Quant: 9.08 ug/mL-FEU — ABNORMAL HIGH (ref 0.00–0.48)

## 2010-12-24 LAB — PHOSPHORUS: Phosphorus: 7.2 mg/dL — ABNORMAL HIGH (ref 2.3–4.6)

## 2010-12-24 LAB — PROTHROMBIN GENE MUTATION

## 2010-12-24 LAB — FACTOR 5 LEIDEN

## 2010-12-25 LAB — CROSSMATCH
ABO/RH(D): A POS
ABO/RH(D): A POS
Antibody Screen: NEGATIVE

## 2010-12-25 LAB — CATH TIP CULTURE
Culture: 8
Culture: NO GROWTH

## 2010-12-25 LAB — POCT I-STAT 3, ART BLOOD GAS (G3+)
Acid-Base Excess: 1 mmol/L (ref 0.0–2.0)
Acid-Base Excess: 2 mmol/L (ref 0.0–2.0)
Acid-Base Excess: 5 mmol/L — ABNORMAL HIGH (ref 0.0–2.0)
Acid-Base Excess: 9 mmol/L — ABNORMAL HIGH (ref 0.0–2.0)
Acid-base deficit: 1 mmol/L (ref 0.0–2.0)
Acid-base deficit: 4 mmol/L — ABNORMAL HIGH (ref 0.0–2.0)
Bicarbonate: 21 mEq/L (ref 20.0–24.0)
Bicarbonate: 25.3 mEq/L — ABNORMAL HIGH (ref 20.0–24.0)
Bicarbonate: 27.7 mEq/L — ABNORMAL HIGH (ref 20.0–24.0)
Bicarbonate: 27.7 mEq/L — ABNORMAL HIGH (ref 20.0–24.0)
Bicarbonate: 30.3 mEq/L — ABNORMAL HIGH (ref 20.0–24.0)
Bicarbonate: 31.3 mEq/L — ABNORMAL HIGH (ref 20.0–24.0)
Bicarbonate: 31.6 mEq/L — ABNORMAL HIGH (ref 20.0–24.0)
O2 Saturation: 92 %
O2 Saturation: 95 %
O2 Saturation: 95 %
O2 Saturation: 96 %
O2 Saturation: 97 %
O2 Saturation: 99 %
O2 Saturation: 99 %
Patient temperature: 100.9
Patient temperature: 37.4
Patient temperature: 39.2
Patient temperature: 98.6
Patient temperature: 99.4
Patient temperature: 99.5
Patient temperature: 99.6
TCO2: 22 mmol/L (ref 0–100)
TCO2: 22 mmol/L (ref 0–100)
TCO2: 24 mmol/L (ref 0–100)
TCO2: 27 mmol/L (ref 0–100)
TCO2: 29 mmol/L (ref 0–100)
pCO2 arterial: 36.6 mmHg (ref 35.0–45.0)
pCO2 arterial: 42.8 mmHg (ref 35.0–45.0)
pCO2 arterial: 46.2 mmHg — ABNORMAL HIGH (ref 35.0–45.0)
pCO2 arterial: 47.3 mmHg — ABNORMAL HIGH (ref 35.0–45.0)
pCO2 arterial: 48.3 mmHg — ABNORMAL HIGH (ref 35.0–45.0)
pCO2 arterial: 51.6 mmHg — ABNORMAL HIGH (ref 35.0–45.0)
pH, Arterial: 7.259 — ABNORMAL LOW (ref 7.350–7.450)
pH, Arterial: 7.335 — ABNORMAL LOW (ref 7.350–7.450)
pH, Arterial: 7.347 — ABNORMAL LOW (ref 7.350–7.450)
pH, Arterial: 7.438 (ref 7.350–7.450)
pO2, Arterial: 102 mmHg — ABNORMAL HIGH (ref 80.0–100.0)
pO2, Arterial: 135 mmHg — ABNORMAL HIGH (ref 80.0–100.0)
pO2, Arterial: 80 mmHg (ref 80.0–100.0)
pO2, Arterial: 82 mmHg (ref 80.0–100.0)
pO2, Arterial: 91 mmHg (ref 80.0–100.0)

## 2010-12-25 LAB — POCT I-STAT, CHEM 8
BUN: 102 mg/dL — ABNORMAL HIGH (ref 6–23)
BUN: 30 mg/dL — ABNORMAL HIGH (ref 6–23)
BUN: 32 mg/dL — ABNORMAL HIGH (ref 6–23)
BUN: 35 mg/dL — ABNORMAL HIGH (ref 6–23)
BUN: 37 mg/dL — ABNORMAL HIGH (ref 6–23)
BUN: 49 mg/dL — ABNORMAL HIGH (ref 6–23)
BUN: 51 mg/dL — ABNORMAL HIGH (ref 6–23)
BUN: 51 mg/dL — ABNORMAL HIGH (ref 6–23)
BUN: 52 mg/dL — ABNORMAL HIGH (ref 6–23)
BUN: 56 mg/dL — ABNORMAL HIGH (ref 6–23)
BUN: 56 mg/dL — ABNORMAL HIGH (ref 6–23)
BUN: 68 mg/dL — ABNORMAL HIGH (ref 6–23)
BUN: 71 mg/dL — ABNORMAL HIGH (ref 6–23)
BUN: 72 mg/dL — ABNORMAL HIGH (ref 6–23)
BUN: 85 mg/dL — ABNORMAL HIGH (ref 6–23)
BUN: 89 mg/dL — ABNORMAL HIGH (ref 6–23)
BUN: 91 mg/dL — ABNORMAL HIGH (ref 6–23)
Calcium, Ion: 0.29 mmol/L — CL (ref 1.12–1.32)
Calcium, Ion: 0.37 mmol/L — CL (ref 1.12–1.32)
Calcium, Ion: 0.37 mmol/L — CL (ref 1.12–1.32)
Calcium, Ion: 0.38 mmol/L — CL (ref 1.12–1.32)
Calcium, Ion: 0.5 mmol/L — CL (ref 1.12–1.32)
Calcium, Ion: 0.51 mmol/L — CL (ref 1.12–1.32)
Calcium, Ion: 0.76 mmol/L — ABNORMAL LOW (ref 1.12–1.32)
Calcium, Ion: 0.9 mmol/L — ABNORMAL LOW (ref 1.12–1.32)
Calcium, Ion: 0.97 mmol/L — ABNORMAL LOW (ref 1.12–1.32)
Calcium, Ion: 1.02 mmol/L — ABNORMAL LOW (ref 1.12–1.32)
Calcium, Ion: 1.03 mmol/L — ABNORMAL LOW (ref 1.12–1.32)
Calcium, Ion: 1.07 mmol/L — ABNORMAL LOW (ref 1.12–1.32)
Calcium, Ion: 1.18 mmol/L (ref 1.12–1.32)
Calcium, Ion: <0.25 mmol/L — CL (ref 1.12–1.32)
Chloride: 102 mEq/L (ref 96–112)
Chloride: 103 mEq/L (ref 96–112)
Chloride: 103 mEq/L (ref 96–112)
Chloride: 103 mEq/L (ref 96–112)
Chloride: 104 mEq/L (ref 96–112)
Chloride: 104 mEq/L (ref 96–112)
Chloride: 105 mEq/L (ref 96–112)
Chloride: 105 mEq/L (ref 96–112)
Chloride: 106 mEq/L (ref 96–112)
Chloride: 106 mEq/L (ref 96–112)
Chloride: 107 mEq/L (ref 96–112)
Chloride: 107 mEq/L (ref 96–112)
Chloride: 108 mEq/L (ref 96–112)
Chloride: 99 mEq/L (ref 96–112)
Creatinine, Ser: 10.9 mg/dL — ABNORMAL HIGH (ref 0.4–1.5)
Creatinine, Ser: 11.4 mg/dL — ABNORMAL HIGH (ref 0.4–1.5)
Creatinine, Ser: 4.2 mg/dL — ABNORMAL HIGH (ref 0.4–1.5)
Creatinine, Ser: 4.6 mg/dL — ABNORMAL HIGH (ref 0.4–1.5)
Creatinine, Ser: 4.8 mg/dL — ABNORMAL HIGH (ref 0.4–1.5)
Creatinine, Ser: 5.8 mg/dL — ABNORMAL HIGH (ref 0.4–1.5)
Creatinine, Ser: 7.1 mg/dL — ABNORMAL HIGH (ref 0.4–1.5)
Creatinine, Ser: 7.2 mg/dL — ABNORMAL HIGH (ref 0.4–1.5)
Creatinine, Ser: 7.4 mg/dL — ABNORMAL HIGH (ref 0.4–1.5)
Creatinine, Ser: 8.8 mg/dL — ABNORMAL HIGH (ref 0.4–1.5)
Creatinine, Ser: 9.2 mg/dL — ABNORMAL HIGH (ref 0.4–1.5)
Creatinine, Ser: 9.2 mg/dL — ABNORMAL HIGH (ref 0.4–1.5)
Creatinine, Ser: 9.2 mg/dL — ABNORMAL HIGH (ref 0.4–1.5)
Creatinine, Ser: 9.4 mg/dL — ABNORMAL HIGH (ref 0.4–1.5)
Glucose, Bld: 132 mg/dL — ABNORMAL HIGH (ref 70–99)
Glucose, Bld: 147 mg/dL — ABNORMAL HIGH (ref 70–99)
Glucose, Bld: 151 mg/dL — ABNORMAL HIGH (ref 70–99)
Glucose, Bld: 152 mg/dL — ABNORMAL HIGH (ref 70–99)
Glucose, Bld: 154 mg/dL — ABNORMAL HIGH (ref 70–99)
Glucose, Bld: 159 mg/dL — ABNORMAL HIGH (ref 70–99)
Glucose, Bld: 165 mg/dL — ABNORMAL HIGH (ref 70–99)
Glucose, Bld: 173 mg/dL — ABNORMAL HIGH (ref 70–99)
Glucose, Bld: 183 mg/dL — ABNORMAL HIGH (ref 70–99)
Glucose, Bld: 186 mg/dL — ABNORMAL HIGH (ref 70–99)
Glucose, Bld: 188 mg/dL — ABNORMAL HIGH (ref 70–99)
Glucose, Bld: 190 mg/dL — ABNORMAL HIGH (ref 70–99)
Glucose, Bld: 191 mg/dL — ABNORMAL HIGH (ref 70–99)
Glucose, Bld: 193 mg/dL — ABNORMAL HIGH (ref 70–99)
Glucose, Bld: 198 mg/dL — ABNORMAL HIGH (ref 70–99)
Glucose, Bld: 207 mg/dL — ABNORMAL HIGH (ref 70–99)
HCT: 22 % — ABNORMAL LOW (ref 39.0–52.0)
HCT: 23 % — ABNORMAL LOW (ref 39.0–52.0)
HCT: 27 % — ABNORMAL LOW (ref 39.0–52.0)
HCT: 28 % — ABNORMAL LOW (ref 39.0–52.0)
Hemoglobin: 8.2 g/dL — ABNORMAL LOW (ref 13.0–17.0)
Hemoglobin: 8.5 g/dL — ABNORMAL LOW (ref 13.0–17.0)
Hemoglobin: 9.5 g/dL — ABNORMAL LOW (ref 13.0–17.0)
Hemoglobin: 9.5 g/dL — ABNORMAL LOW (ref 13.0–17.0)
Potassium: 3.7 mEq/L (ref 3.5–5.1)
Potassium: 3.7 mEq/L (ref 3.5–5.1)
Potassium: 3.7 mEq/L (ref 3.5–5.1)
Potassium: 3.7 mEq/L (ref 3.5–5.1)
Potassium: 3.8 mEq/L (ref 3.5–5.1)
Potassium: 3.8 mEq/L (ref 3.5–5.1)
Potassium: 3.8 mEq/L (ref 3.5–5.1)
Potassium: 3.9 mEq/L (ref 3.5–5.1)
Potassium: 3.9 mEq/L (ref 3.5–5.1)
Potassium: 3.9 mEq/L (ref 3.5–5.1)
Potassium: 3.9 mEq/L (ref 3.5–5.1)
Potassium: 4 mEq/L (ref 3.5–5.1)
Potassium: 4.3 mEq/L (ref 3.5–5.1)
Sodium: 135 mEq/L (ref 135–145)
Sodium: 138 mEq/L (ref 135–145)
Sodium: 139 mEq/L (ref 135–145)
Sodium: 140 mEq/L (ref 135–145)
Sodium: 140 mEq/L (ref 135–145)
TCO2: 23 mmol/L (ref 0–100)
TCO2: 24 mmol/L (ref 0–100)
TCO2: 24 mmol/L (ref 0–100)
TCO2: 25 mmol/L (ref 0–100)
TCO2: 26 mmol/L (ref 0–100)
TCO2: 27 mmol/L (ref 0–100)
TCO2: 27 mmol/L (ref 0–100)
TCO2: 28 mmol/L (ref 0–100)

## 2010-12-25 LAB — POCT I-STAT 7, (LYTES, BLD GAS, ICA,H+H)
Acid-Base Excess: 3 mmol/L — ABNORMAL HIGH (ref 0.0–2.0)
Bicarbonate: 27.5 mEq/L — ABNORMAL HIGH (ref 20.0–24.0)
Bicarbonate: 29.3 mEq/L — ABNORMAL HIGH (ref 20.0–24.0)
Bicarbonate: 32.1 mEq/L — ABNORMAL HIGH (ref 20.0–24.0)
Bicarbonate: 32.7 mEq/L — ABNORMAL HIGH (ref 20.0–24.0)
Calcium, Ion: 1.11 mmol/L — ABNORMAL LOW (ref 1.12–1.32)
Calcium, Ion: 1.17 mmol/L (ref 1.12–1.32)
HCT: 23 % — ABNORMAL LOW (ref 39.0–52.0)
HCT: 23 % — ABNORMAL LOW (ref 39.0–52.0)
HCT: 25 % — ABNORMAL LOW (ref 39.0–52.0)
Hemoglobin: 11.9 g/dL — ABNORMAL LOW (ref 13.0–17.0)
Hemoglobin: 7.8 g/dL — ABNORMAL LOW (ref 13.0–17.0)
Hemoglobin: 7.8 g/dL — ABNORMAL LOW (ref 13.0–17.0)
Hemoglobin: 8.5 g/dL — ABNORMAL LOW (ref 13.0–17.0)
Hemoglobin: 9.5 g/dL — ABNORMAL LOW (ref 13.0–17.0)
O2 Saturation: 96 %
O2 Saturation: 97 %
O2 Saturation: 99 %
Patient temperature: 100.3
Patient temperature: 101.4
Patient temperature: 101.8
Patient temperature: 38.1
Patient temperature: 99.4
Potassium: 3.9 mEq/L (ref 3.5–5.1)
Potassium: 4 mEq/L (ref 3.5–5.1)
Potassium: 4.4 mEq/L (ref 3.5–5.1)
Sodium: 136 mEq/L (ref 135–145)
Sodium: 136 mEq/L (ref 135–145)
Sodium: 138 mEq/L (ref 135–145)
TCO2: 29 mmol/L (ref 0–100)
TCO2: 31 mmol/L (ref 0–100)
TCO2: 33 mmol/L (ref 0–100)
TCO2: 34 mmol/L (ref 0–100)
pCO2 arterial: 45.1 mmHg — ABNORMAL HIGH (ref 35.0–45.0)
pCO2 arterial: 52.2 mmHg — ABNORMAL HIGH (ref 35.0–45.0)
pCO2 arterial: 53.5 mmHg — ABNORMAL HIGH (ref 35.0–45.0)
pH, Arterial: 7.364 (ref 7.350–7.450)
pH, Arterial: 7.391 (ref 7.350–7.450)
pH, Arterial: 7.41 (ref 7.350–7.450)
pH, Arterial: 7.449 (ref 7.350–7.450)
pH, Arterial: 7.467 — ABNORMAL HIGH (ref 7.350–7.450)
pO2, Arterial: 35 mmHg — CL (ref 80.0–100.0)
pO2, Arterial: 82 mmHg (ref 80.0–100.0)
pO2, Arterial: 93 mmHg (ref 80.0–100.0)

## 2010-12-25 LAB — GLUCOSE, CAPILLARY
Glucose-Capillary: 100 mg/dL — ABNORMAL HIGH (ref 70–99)
Glucose-Capillary: 102 mg/dL — ABNORMAL HIGH (ref 70–99)
Glucose-Capillary: 103 mg/dL — ABNORMAL HIGH (ref 70–99)
Glucose-Capillary: 103 mg/dL — ABNORMAL HIGH (ref 70–99)
Glucose-Capillary: 107 mg/dL — ABNORMAL HIGH (ref 70–99)
Glucose-Capillary: 107 mg/dL — ABNORMAL HIGH (ref 70–99)
Glucose-Capillary: 107 mg/dL — ABNORMAL HIGH (ref 70–99)
Glucose-Capillary: 110 mg/dL — ABNORMAL HIGH (ref 70–99)
Glucose-Capillary: 112 mg/dL — ABNORMAL HIGH (ref 70–99)
Glucose-Capillary: 113 mg/dL — ABNORMAL HIGH (ref 70–99)
Glucose-Capillary: 114 mg/dL — ABNORMAL HIGH (ref 70–99)
Glucose-Capillary: 114 mg/dL — ABNORMAL HIGH (ref 70–99)
Glucose-Capillary: 114 mg/dL — ABNORMAL HIGH (ref 70–99)
Glucose-Capillary: 116 mg/dL — ABNORMAL HIGH (ref 70–99)
Glucose-Capillary: 117 mg/dL — ABNORMAL HIGH (ref 70–99)
Glucose-Capillary: 118 mg/dL — ABNORMAL HIGH (ref 70–99)
Glucose-Capillary: 120 mg/dL — ABNORMAL HIGH (ref 70–99)
Glucose-Capillary: 121 mg/dL — ABNORMAL HIGH (ref 70–99)
Glucose-Capillary: 121 mg/dL — ABNORMAL HIGH (ref 70–99)
Glucose-Capillary: 123 mg/dL — ABNORMAL HIGH (ref 70–99)
Glucose-Capillary: 123 mg/dL — ABNORMAL HIGH (ref 70–99)
Glucose-Capillary: 123 mg/dL — ABNORMAL HIGH (ref 70–99)
Glucose-Capillary: 123 mg/dL — ABNORMAL HIGH (ref 70–99)
Glucose-Capillary: 125 mg/dL — ABNORMAL HIGH (ref 70–99)
Glucose-Capillary: 125 mg/dL — ABNORMAL HIGH (ref 70–99)
Glucose-Capillary: 125 mg/dL — ABNORMAL HIGH (ref 70–99)
Glucose-Capillary: 126 mg/dL — ABNORMAL HIGH (ref 70–99)
Glucose-Capillary: 130 mg/dL — ABNORMAL HIGH (ref 70–99)
Glucose-Capillary: 131 mg/dL — ABNORMAL HIGH (ref 70–99)
Glucose-Capillary: 131 mg/dL — ABNORMAL HIGH (ref 70–99)
Glucose-Capillary: 133 mg/dL — ABNORMAL HIGH (ref 70–99)
Glucose-Capillary: 134 mg/dL — ABNORMAL HIGH (ref 70–99)
Glucose-Capillary: 135 mg/dL — ABNORMAL HIGH (ref 70–99)
Glucose-Capillary: 135 mg/dL — ABNORMAL HIGH (ref 70–99)
Glucose-Capillary: 135 mg/dL — ABNORMAL HIGH (ref 70–99)
Glucose-Capillary: 136 mg/dL — ABNORMAL HIGH (ref 70–99)
Glucose-Capillary: 136 mg/dL — ABNORMAL HIGH (ref 70–99)
Glucose-Capillary: 136 mg/dL — ABNORMAL HIGH (ref 70–99)
Glucose-Capillary: 137 mg/dL — ABNORMAL HIGH (ref 70–99)
Glucose-Capillary: 138 mg/dL — ABNORMAL HIGH (ref 70–99)
Glucose-Capillary: 138 mg/dL — ABNORMAL HIGH (ref 70–99)
Glucose-Capillary: 139 mg/dL — ABNORMAL HIGH (ref 70–99)
Glucose-Capillary: 139 mg/dL — ABNORMAL HIGH (ref 70–99)
Glucose-Capillary: 140 mg/dL — ABNORMAL HIGH (ref 70–99)
Glucose-Capillary: 140 mg/dL — ABNORMAL HIGH (ref 70–99)
Glucose-Capillary: 141 mg/dL — ABNORMAL HIGH (ref 70–99)
Glucose-Capillary: 143 mg/dL — ABNORMAL HIGH (ref 70–99)
Glucose-Capillary: 144 mg/dL — ABNORMAL HIGH (ref 70–99)
Glucose-Capillary: 144 mg/dL — ABNORMAL HIGH (ref 70–99)
Glucose-Capillary: 147 mg/dL — ABNORMAL HIGH (ref 70–99)
Glucose-Capillary: 149 mg/dL — ABNORMAL HIGH (ref 70–99)
Glucose-Capillary: 149 mg/dL — ABNORMAL HIGH (ref 70–99)
Glucose-Capillary: 150 mg/dL — ABNORMAL HIGH (ref 70–99)
Glucose-Capillary: 150 mg/dL — ABNORMAL HIGH (ref 70–99)
Glucose-Capillary: 151 mg/dL — ABNORMAL HIGH (ref 70–99)
Glucose-Capillary: 151 mg/dL — ABNORMAL HIGH (ref 70–99)
Glucose-Capillary: 151 mg/dL — ABNORMAL HIGH (ref 70–99)
Glucose-Capillary: 153 mg/dL — ABNORMAL HIGH (ref 70–99)
Glucose-Capillary: 153 mg/dL — ABNORMAL HIGH (ref 70–99)
Glucose-Capillary: 154 mg/dL — ABNORMAL HIGH (ref 70–99)
Glucose-Capillary: 154 mg/dL — ABNORMAL HIGH (ref 70–99)
Glucose-Capillary: 154 mg/dL — ABNORMAL HIGH (ref 70–99)
Glucose-Capillary: 155 mg/dL — ABNORMAL HIGH (ref 70–99)
Glucose-Capillary: 155 mg/dL — ABNORMAL HIGH (ref 70–99)
Glucose-Capillary: 157 mg/dL — ABNORMAL HIGH (ref 70–99)
Glucose-Capillary: 158 mg/dL — ABNORMAL HIGH (ref 70–99)
Glucose-Capillary: 159 mg/dL — ABNORMAL HIGH (ref 70–99)
Glucose-Capillary: 160 mg/dL — ABNORMAL HIGH (ref 70–99)
Glucose-Capillary: 162 mg/dL — ABNORMAL HIGH (ref 70–99)
Glucose-Capillary: 162 mg/dL — ABNORMAL HIGH (ref 70–99)
Glucose-Capillary: 166 mg/dL — ABNORMAL HIGH (ref 70–99)
Glucose-Capillary: 169 mg/dL — ABNORMAL HIGH (ref 70–99)
Glucose-Capillary: 175 mg/dL — ABNORMAL HIGH (ref 70–99)
Glucose-Capillary: 177 mg/dL — ABNORMAL HIGH (ref 70–99)
Glucose-Capillary: 177 mg/dL — ABNORMAL HIGH (ref 70–99)
Glucose-Capillary: 178 mg/dL — ABNORMAL HIGH (ref 70–99)
Glucose-Capillary: 178 mg/dL — ABNORMAL HIGH (ref 70–99)
Glucose-Capillary: 178 mg/dL — ABNORMAL HIGH (ref 70–99)
Glucose-Capillary: 178 mg/dL — ABNORMAL HIGH (ref 70–99)
Glucose-Capillary: 179 mg/dL — ABNORMAL HIGH (ref 70–99)
Glucose-Capillary: 181 mg/dL — ABNORMAL HIGH (ref 70–99)
Glucose-Capillary: 183 mg/dL — ABNORMAL HIGH (ref 70–99)
Glucose-Capillary: 183 mg/dL — ABNORMAL HIGH (ref 70–99)
Glucose-Capillary: 184 mg/dL — ABNORMAL HIGH (ref 70–99)
Glucose-Capillary: 184 mg/dL — ABNORMAL HIGH (ref 70–99)
Glucose-Capillary: 185 mg/dL — ABNORMAL HIGH (ref 70–99)
Glucose-Capillary: 186 mg/dL — ABNORMAL HIGH (ref 70–99)
Glucose-Capillary: 186 mg/dL — ABNORMAL HIGH (ref 70–99)
Glucose-Capillary: 186 mg/dL — ABNORMAL HIGH (ref 70–99)
Glucose-Capillary: 186 mg/dL — ABNORMAL HIGH (ref 70–99)
Glucose-Capillary: 187 mg/dL — ABNORMAL HIGH (ref 70–99)
Glucose-Capillary: 188 mg/dL — ABNORMAL HIGH (ref 70–99)
Glucose-Capillary: 189 mg/dL — ABNORMAL HIGH (ref 70–99)
Glucose-Capillary: 190 mg/dL — ABNORMAL HIGH (ref 70–99)
Glucose-Capillary: 190 mg/dL — ABNORMAL HIGH (ref 70–99)
Glucose-Capillary: 199 mg/dL — ABNORMAL HIGH (ref 70–99)
Glucose-Capillary: 202 mg/dL — ABNORMAL HIGH (ref 70–99)
Glucose-Capillary: 202 mg/dL — ABNORMAL HIGH (ref 70–99)
Glucose-Capillary: 203 mg/dL — ABNORMAL HIGH (ref 70–99)
Glucose-Capillary: 203 mg/dL — ABNORMAL HIGH (ref 70–99)
Glucose-Capillary: 204 mg/dL — ABNORMAL HIGH (ref 70–99)
Glucose-Capillary: 204 mg/dL — ABNORMAL HIGH (ref 70–99)
Glucose-Capillary: 205 mg/dL — ABNORMAL HIGH (ref 70–99)
Glucose-Capillary: 206 mg/dL — ABNORMAL HIGH (ref 70–99)
Glucose-Capillary: 206 mg/dL — ABNORMAL HIGH (ref 70–99)
Glucose-Capillary: 210 mg/dL — ABNORMAL HIGH (ref 70–99)
Glucose-Capillary: 211 mg/dL — ABNORMAL HIGH (ref 70–99)
Glucose-Capillary: 212 mg/dL — ABNORMAL HIGH (ref 70–99)
Glucose-Capillary: 213 mg/dL — ABNORMAL HIGH (ref 70–99)
Glucose-Capillary: 214 mg/dL — ABNORMAL HIGH (ref 70–99)
Glucose-Capillary: 215 mg/dL — ABNORMAL HIGH (ref 70–99)
Glucose-Capillary: 216 mg/dL — ABNORMAL HIGH (ref 70–99)
Glucose-Capillary: 217 mg/dL — ABNORMAL HIGH (ref 70–99)
Glucose-Capillary: 219 mg/dL — ABNORMAL HIGH (ref 70–99)
Glucose-Capillary: 219 mg/dL — ABNORMAL HIGH (ref 70–99)
Glucose-Capillary: 223 mg/dL — ABNORMAL HIGH (ref 70–99)
Glucose-Capillary: 223 mg/dL — ABNORMAL HIGH (ref 70–99)
Glucose-Capillary: 224 mg/dL — ABNORMAL HIGH (ref 70–99)
Glucose-Capillary: 229 mg/dL — ABNORMAL HIGH (ref 70–99)
Glucose-Capillary: 238 mg/dL — ABNORMAL HIGH (ref 70–99)
Glucose-Capillary: 239 mg/dL — ABNORMAL HIGH (ref 70–99)
Glucose-Capillary: 241 mg/dL — ABNORMAL HIGH (ref 70–99)
Glucose-Capillary: 241 mg/dL — ABNORMAL HIGH (ref 70–99)
Glucose-Capillary: 246 mg/dL — ABNORMAL HIGH (ref 70–99)
Glucose-Capillary: 258 mg/dL — ABNORMAL HIGH (ref 70–99)
Glucose-Capillary: 260 mg/dL — ABNORMAL HIGH (ref 70–99)
Glucose-Capillary: 264 mg/dL — ABNORMAL HIGH (ref 70–99)
Glucose-Capillary: 266 mg/dL — ABNORMAL HIGH (ref 70–99)
Glucose-Capillary: 266 mg/dL — ABNORMAL HIGH (ref 70–99)
Glucose-Capillary: 280 mg/dL — ABNORMAL HIGH (ref 70–99)
Glucose-Capillary: 281 mg/dL — ABNORMAL HIGH (ref 70–99)
Glucose-Capillary: 281 mg/dL — ABNORMAL HIGH (ref 70–99)
Glucose-Capillary: 282 mg/dL — ABNORMAL HIGH (ref 70–99)
Glucose-Capillary: 284 mg/dL — ABNORMAL HIGH (ref 70–99)
Glucose-Capillary: 298 mg/dL — ABNORMAL HIGH (ref 70–99)
Glucose-Capillary: 304 mg/dL — ABNORMAL HIGH (ref 70–99)
Glucose-Capillary: 305 mg/dL — ABNORMAL HIGH (ref 70–99)
Glucose-Capillary: 306 mg/dL — ABNORMAL HIGH (ref 70–99)
Glucose-Capillary: 315 mg/dL — ABNORMAL HIGH (ref 70–99)
Glucose-Capillary: 328 mg/dL — ABNORMAL HIGH (ref 70–99)
Glucose-Capillary: 331 mg/dL — ABNORMAL HIGH (ref 70–99)
Glucose-Capillary: 342 mg/dL — ABNORMAL HIGH (ref 70–99)
Glucose-Capillary: 352 mg/dL — ABNORMAL HIGH (ref 70–99)
Glucose-Capillary: 363 mg/dL — ABNORMAL HIGH (ref 70–99)
Glucose-Capillary: 365 mg/dL — ABNORMAL HIGH (ref 70–99)
Glucose-Capillary: 369 mg/dL — ABNORMAL HIGH (ref 70–99)
Glucose-Capillary: 370 mg/dL — ABNORMAL HIGH (ref 70–99)
Glucose-Capillary: 377 mg/dL — ABNORMAL HIGH (ref 70–99)
Glucose-Capillary: 412 mg/dL — ABNORMAL HIGH (ref 70–99)
Glucose-Capillary: 420 mg/dL — ABNORMAL HIGH (ref 70–99)
Glucose-Capillary: 67 mg/dL — ABNORMAL LOW (ref 70–99)
Glucose-Capillary: 69 mg/dL — ABNORMAL LOW (ref 70–99)
Glucose-Capillary: 78 mg/dL (ref 70–99)
Glucose-Capillary: 85 mg/dL (ref 70–99)
Glucose-Capillary: 90 mg/dL (ref 70–99)
Glucose-Capillary: 91 mg/dL (ref 70–99)
Glucose-Capillary: 91 mg/dL (ref 70–99)
Glucose-Capillary: 91 mg/dL (ref 70–99)
Glucose-Capillary: 92 mg/dL (ref 70–99)
Glucose-Capillary: 98 mg/dL (ref 70–99)

## 2010-12-25 LAB — CBC
HCT: 19.5 % — ABNORMAL LOW (ref 39.0–52.0)
HCT: 19.9 % — ABNORMAL LOW (ref 39.0–52.0)
HCT: 20.1 % — ABNORMAL LOW (ref 39.0–52.0)
HCT: 20.8 % — ABNORMAL LOW (ref 39.0–52.0)
HCT: 21 % — ABNORMAL LOW (ref 39.0–52.0)
HCT: 21.1 % — ABNORMAL LOW (ref 39.0–52.0)
HCT: 21.2 % — ABNORMAL LOW (ref 39.0–52.0)
HCT: 21.2 % — ABNORMAL LOW (ref 39.0–52.0)
HCT: 21.4 % — ABNORMAL LOW (ref 39.0–52.0)
HCT: 22.1 % — ABNORMAL LOW (ref 39.0–52.0)
HCT: 23.2 % — ABNORMAL LOW (ref 39.0–52.0)
HCT: 23.4 % — ABNORMAL LOW (ref 39.0–52.0)
HCT: 23.8 % — ABNORMAL LOW (ref 39.0–52.0)
HCT: 23.8 % — ABNORMAL LOW (ref 39.0–52.0)
HCT: 24.2 % — ABNORMAL LOW (ref 39.0–52.0)
HCT: 24.3 % — ABNORMAL LOW (ref 39.0–52.0)
Hemoglobin: 6.5 g/dL — CL (ref 13.0–17.0)
Hemoglobin: 6.6 g/dL — CL (ref 13.0–17.0)
Hemoglobin: 7 g/dL — ABNORMAL LOW (ref 13.0–17.0)
Hemoglobin: 7.1 g/dL — ABNORMAL LOW (ref 13.0–17.0)
Hemoglobin: 7.2 g/dL — ABNORMAL LOW (ref 13.0–17.0)
Hemoglobin: 7.2 g/dL — ABNORMAL LOW (ref 13.0–17.0)
Hemoglobin: 7.3 g/dL — ABNORMAL LOW (ref 13.0–17.0)
Hemoglobin: 7.3 g/dL — ABNORMAL LOW (ref 13.0–17.0)
Hemoglobin: 7.5 g/dL — ABNORMAL LOW (ref 13.0–17.0)
Hemoglobin: 7.5 g/dL — ABNORMAL LOW (ref 13.0–17.0)
Hemoglobin: 7.5 g/dL — ABNORMAL LOW (ref 13.0–17.0)
Hemoglobin: 7.6 g/dL — ABNORMAL LOW (ref 13.0–17.0)
Hemoglobin: 7.9 g/dL — ABNORMAL LOW (ref 13.0–17.0)
Hemoglobin: 7.9 g/dL — ABNORMAL LOW (ref 13.0–17.0)
Hemoglobin: 8 g/dL — ABNORMAL LOW (ref 13.0–17.0)
Hemoglobin: 8.1 g/dL — ABNORMAL LOW (ref 13.0–17.0)
Hemoglobin: 8.2 g/dL — ABNORMAL LOW (ref 13.0–17.0)
Hemoglobin: 8.3 g/dL — ABNORMAL LOW (ref 13.0–17.0)
Hemoglobin: 8.4 g/dL — ABNORMAL LOW (ref 13.0–17.0)
MCHC: 33.2 g/dL (ref 30.0–36.0)
MCHC: 33.3 g/dL (ref 30.0–36.0)
MCHC: 33.3 g/dL (ref 30.0–36.0)
MCHC: 33.4 g/dL (ref 30.0–36.0)
MCHC: 33.5 g/dL (ref 30.0–36.0)
MCHC: 33.7 g/dL (ref 30.0–36.0)
MCHC: 33.7 g/dL (ref 30.0–36.0)
MCHC: 33.9 g/dL (ref 30.0–36.0)
MCHC: 34 g/dL (ref 30.0–36.0)
MCHC: 34.1 g/dL (ref 30.0–36.0)
MCHC: 34.2 g/dL (ref 30.0–36.0)
MCHC: 34.2 g/dL (ref 30.0–36.0)
MCHC: 34.3 g/dL (ref 30.0–36.0)
MCHC: 34.3 g/dL (ref 30.0–36.0)
MCHC: 34.4 g/dL (ref 30.0–36.0)
MCHC: 34.4 g/dL (ref 30.0–36.0)
MCHC: 34.5 g/dL (ref 30.0–36.0)
MCHC: 34.7 g/dL (ref 30.0–36.0)
MCHC: 35.2 g/dL (ref 30.0–36.0)
MCV: 93.6 fL (ref 78.0–100.0)
MCV: 94.9 fL (ref 78.0–100.0)
MCV: 95.1 fL (ref 78.0–100.0)
MCV: 95.2 fL (ref 78.0–100.0)
MCV: 95.3 fL (ref 78.0–100.0)
MCV: 95.6 fL (ref 78.0–100.0)
MCV: 95.8 fL (ref 78.0–100.0)
MCV: 95.8 fL (ref 78.0–100.0)
MCV: 96 fL (ref 78.0–100.0)
MCV: 96.1 fL (ref 78.0–100.0)
MCV: 96.2 fL (ref 78.0–100.0)
MCV: 96.3 fL (ref 78.0–100.0)
MCV: 96.3 fL (ref 78.0–100.0)
MCV: 96.6 fL (ref 78.0–100.0)
MCV: 96.9 fL (ref 78.0–100.0)
MCV: 96.9 fL (ref 78.0–100.0)
MCV: 97 fL (ref 78.0–100.0)
MCV: 97.6 fL (ref 78.0–100.0)
MCV: 97.8 fL (ref 78.0–100.0)
MCV: 98.5 fL (ref 78.0–100.0)
Platelets: 139 K/uL — ABNORMAL LOW (ref 150–400)
Platelets: 143 10*3/uL — ABNORMAL LOW (ref 150–400)
Platelets: 160 10*3/uL (ref 150–400)
Platelets: 163 K/uL (ref 150–400)
Platelets: 174 K/uL (ref 150–400)
Platelets: 176 10*3/uL (ref 150–400)
Platelets: 177 10*3/uL (ref 150–400)
Platelets: 179 10*3/uL (ref 150–400)
Platelets: 187 10*3/uL (ref 150–400)
Platelets: 214 10*3/uL (ref 150–400)
Platelets: 235 K/uL (ref 150–400)
Platelets: 244 10*3/uL (ref 150–400)
Platelets: 244 10*3/uL (ref 150–400)
Platelets: 247 10*3/uL (ref 150–400)
Platelets: 262 10*3/uL (ref 150–400)
Platelets: 284 10*3/uL (ref 150–400)
Platelets: 330 10*3/uL (ref 150–400)
Platelets: 353 10*3/uL (ref 150–400)
RBC: 1.98 MIL/uL — ABNORMAL LOW (ref 4.22–5.81)
RBC: 2.05 MIL/uL — ABNORMAL LOW (ref 4.22–5.81)
RBC: 2.06 MIL/uL — ABNORMAL LOW (ref 4.22–5.81)
RBC: 2.2 MIL/uL — ABNORMAL LOW (ref 4.22–5.81)
RBC: 2.21 MIL/uL — ABNORMAL LOW (ref 4.22–5.81)
RBC: 2.22 MIL/uL — ABNORMAL LOW (ref 4.22–5.81)
RBC: 2.22 MIL/uL — ABNORMAL LOW (ref 4.22–5.81)
RBC: 2.24 MIL/uL — ABNORMAL LOW (ref 4.22–5.81)
RBC: 2.28 MIL/uL — ABNORMAL LOW (ref 4.22–5.81)
RBC: 2.29 MIL/uL — ABNORMAL LOW (ref 4.22–5.81)
RBC: 2.31 MIL/uL — ABNORMAL LOW (ref 4.22–5.81)
RBC: 2.32 MIL/uL — ABNORMAL LOW (ref 4.22–5.81)
RBC: 2.32 MIL/uL — ABNORMAL LOW (ref 4.22–5.81)
RBC: 2.38 MIL/uL — ABNORMAL LOW (ref 4.22–5.81)
RBC: 2.45 MIL/uL — ABNORMAL LOW (ref 4.22–5.81)
RBC: 2.46 MIL/uL — ABNORMAL LOW (ref 4.22–5.81)
RBC: 2.53 MIL/uL — ABNORMAL LOW (ref 4.22–5.81)
RDW: 16.2 % — ABNORMAL HIGH (ref 11.5–15.5)
RDW: 16.4 % — ABNORMAL HIGH (ref 11.5–15.5)
RDW: 16.5 % — ABNORMAL HIGH (ref 11.5–15.5)
RDW: 16.6 % — ABNORMAL HIGH (ref 11.5–15.5)
RDW: 16.6 % — ABNORMAL HIGH (ref 11.5–15.5)
RDW: 16.7 % — ABNORMAL HIGH (ref 11.5–15.5)
RDW: 16.7 % — ABNORMAL HIGH (ref 11.5–15.5)
RDW: 16.9 % — ABNORMAL HIGH (ref 11.5–15.5)
RDW: 16.9 % — ABNORMAL HIGH (ref 11.5–15.5)
RDW: 17 % — ABNORMAL HIGH (ref 11.5–15.5)
RDW: 17 % — ABNORMAL HIGH (ref 11.5–15.5)
RDW: 17 % — ABNORMAL HIGH (ref 11.5–15.5)
RDW: 17.1 % — ABNORMAL HIGH (ref 11.5–15.5)
RDW: 17.1 % — ABNORMAL HIGH (ref 11.5–15.5)
RDW: 17.2 % — ABNORMAL HIGH (ref 11.5–15.5)
RDW: 17.4 % — ABNORMAL HIGH (ref 11.5–15.5)
RDW: 17.5 % — ABNORMAL HIGH (ref 11.5–15.5)
WBC: 10.2 10*3/uL (ref 4.0–10.5)
WBC: 10.5 10*3/uL (ref 4.0–10.5)
WBC: 12.3 10*3/uL — ABNORMAL HIGH (ref 4.0–10.5)
WBC: 13.1 10*3/uL — ABNORMAL HIGH (ref 4.0–10.5)
WBC: 13.5 10*3/uL — ABNORMAL HIGH (ref 4.0–10.5)
WBC: 14.6 10*3/uL — ABNORMAL HIGH (ref 4.0–10.5)
WBC: 19.5 K/uL — ABNORMAL HIGH (ref 4.0–10.5)
WBC: 19.8 K/uL — ABNORMAL HIGH (ref 4.0–10.5)
WBC: 46.6 10*3/uL — ABNORMAL HIGH (ref 4.0–10.5)
WBC: 47.5 10*3/uL — ABNORMAL HIGH (ref 4.0–10.5)
WBC: 55.4 10*3/uL (ref 4.0–10.5)
WBC: 59.9 K/uL (ref 4.0–10.5)
WBC: 62.9 10*3/uL (ref 4.0–10.5)
WBC: 68.3 10*3/uL (ref 4.0–10.5)
WBC: 71.1 10*3/uL (ref 4.0–10.5)
WBC: 8.7 10*3/uL (ref 4.0–10.5)
WBC: 8.9 10*3/uL (ref 4.0–10.5)

## 2010-12-25 LAB — POCT I-STAT EG7
Acid-Base Excess: 10 mmol/L — ABNORMAL HIGH (ref 0.0–2.0)
Acid-Base Excess: 12 mmol/L — ABNORMAL HIGH (ref 0.0–2.0)
Acid-Base Excess: 13 mmol/L — ABNORMAL HIGH (ref 0.0–2.0)
Acid-Base Excess: 4 mmol/L — ABNORMAL HIGH (ref 0.0–2.0)
Acid-Base Excess: 4 mmol/L — ABNORMAL HIGH (ref 0.0–2.0)
Acid-Base Excess: 4 mmol/L — ABNORMAL HIGH (ref 0.0–2.0)
Acid-Base Excess: 4 mmol/L — ABNORMAL HIGH (ref 0.0–2.0)
Acid-Base Excess: 6 mmol/L — ABNORMAL HIGH (ref 0.0–2.0)
Bicarbonate: 25.3 mEq/L — ABNORMAL HIGH (ref 20.0–24.0)
Bicarbonate: 25.6 mEq/L — ABNORMAL HIGH (ref 20.0–24.0)
Bicarbonate: 30.3 mEq/L — ABNORMAL HIGH (ref 20.0–24.0)
Bicarbonate: 37.6 mEq/L — ABNORMAL HIGH (ref 20.0–24.0)
Bicarbonate: 39.2 mEq/L — ABNORMAL HIGH (ref 20.0–24.0)
Bicarbonate: 39.3 mEq/L — ABNORMAL HIGH (ref 20.0–24.0)
Calcium, Ion: 0.31 mmol/L — CL (ref 1.12–1.32)
Calcium, Ion: 0.32 mmol/L — CL (ref 1.12–1.32)
Calcium, Ion: 0.46 mmol/L — CL (ref 1.12–1.32)
Calcium, Ion: 0.49 mmol/L — CL (ref 1.12–1.32)
Calcium, Ion: 1.01 mmol/L — ABNORMAL LOW (ref 1.12–1.32)
Calcium, Ion: 1.14 mmol/L (ref 1.12–1.32)
Calcium, Ion: 1.16 mmol/L (ref 1.12–1.32)
HCT: 22 % — ABNORMAL LOW (ref 39.0–52.0)
HCT: 23 % — ABNORMAL LOW (ref 39.0–52.0)
HCT: 25 % — ABNORMAL LOW (ref 39.0–52.0)
HCT: 25 % — ABNORMAL LOW (ref 39.0–52.0)
HCT: 26 % — ABNORMAL LOW (ref 39.0–52.0)
HCT: 26 % — ABNORMAL LOW (ref 39.0–52.0)
HCT: 27 % — ABNORMAL LOW (ref 39.0–52.0)
Hemoglobin: 7.5 g/dL — ABNORMAL LOW (ref 13.0–17.0)
Hemoglobin: 8.5 g/dL — ABNORMAL LOW (ref 13.0–17.0)
Hemoglobin: 8.8 g/dL — ABNORMAL LOW (ref 13.0–17.0)
Hemoglobin: 8.8 g/dL — ABNORMAL LOW (ref 13.0–17.0)
Hemoglobin: 9.2 g/dL — ABNORMAL LOW (ref 13.0–17.0)
O2 Saturation: 54 %
O2 Saturation: 54 %
O2 Saturation: 55 %
O2 Saturation: 56 %
O2 Saturation: 58 %
O2 Saturation: 58 %
O2 Saturation: 62 %
O2 Saturation: 98 %
Patient temperature: 100.2
Patient temperature: 101.4
Patient temperature: 101.4
Patient temperature: 37.4
Patient temperature: 37.4
Patient temperature: 37.5
Potassium: 3.8 mEq/L (ref 3.5–5.1)
Potassium: 4 mEq/L (ref 3.5–5.1)
Potassium: 4.1 mEq/L (ref 3.5–5.1)
Potassium: 4.3 mEq/L (ref 3.5–5.1)
Sodium: 137 mEq/L (ref 135–145)
Sodium: 138 mEq/L (ref 135–145)
Sodium: 138 mEq/L (ref 135–145)
Sodium: 138 mEq/L (ref 135–145)
Sodium: 139 mEq/L (ref 135–145)
Sodium: 139 mEq/L (ref 135–145)
Sodium: 140 mEq/L (ref 135–145)
Sodium: 140 mEq/L (ref 135–145)
Sodium: 141 mEq/L (ref 135–145)
Sodium: 141 mEq/L (ref 135–145)
TCO2: 26 mmol/L (ref 0–100)
TCO2: 27 mmol/L (ref 0–100)
TCO2: 27 mmol/L (ref 0–100)
TCO2: 28 mmol/L (ref 0–100)
TCO2: 35 mmol/L (ref 0–100)
TCO2: 39 mmol/L (ref 0–100)
TCO2: 41 mmol/L (ref 0–100)
TCO2: 41 mmol/L (ref 0–100)
pCO2, Ven: 47.6 mmHg (ref 45.0–50.0)
pCO2, Ven: 54.4 mmHg — ABNORMAL HIGH (ref 45.0–50.0)
pCO2, Ven: 55.3 mmHg — ABNORMAL HIGH (ref 45.0–50.0)
pCO2, Ven: 56.2 mmHg — ABNORMAL HIGH (ref 45.0–50.0)
pCO2, Ven: 56.3 mmHg — ABNORMAL HIGH (ref 45.0–50.0)
pCO2, Ven: 63.9 mmHg — ABNORMAL HIGH (ref 45.0–50.0)
pH, Ven: 7.267 (ref 7.250–7.300)
pH, Ven: 7.268 (ref 7.250–7.300)
pH, Ven: 7.27 (ref 7.250–7.300)
pH, Ven: 7.392 — ABNORMAL HIGH (ref 7.250–7.300)
pO2, Ven: 31 mmHg (ref 30.0–45.0)
pO2, Ven: 35 mmHg (ref 30.0–45.0)
pO2, Ven: 36 mmHg (ref 30.0–45.0)
pO2, Ven: 36 mmHg (ref 30.0–45.0)
pO2, Ven: 37 mmHg (ref 30.0–45.0)
pO2, Ven: 78 mmHg — ABNORMAL HIGH (ref 30.0–45.0)

## 2010-12-25 LAB — BASIC METABOLIC PANEL
BUN: 77 mg/dL — ABNORMAL HIGH (ref 6–23)
CO2: 26 mEq/L (ref 19–32)
Calcium: 8.3 mg/dL — ABNORMAL LOW (ref 8.4–10.5)
Chloride: 102 mEq/L (ref 96–112)
Chloride: 105 mEq/L (ref 96–112)
GFR calc Af Amer: 6 mL/min — ABNORMAL LOW (ref >60)
GFR calc Af Amer: 9 mL/min — ABNORMAL LOW (ref >60)
GFR calc non Af Amer: 5 mL/min — ABNORMAL LOW (ref >60)
GFR calc non Af Amer: 6 mL/min — ABNORMAL LOW (ref >60)
Glucose, Bld: 238 mg/dL — ABNORMAL HIGH (ref 70–99)
Potassium: 3.8 mEq/L (ref 3.5–5.1)
Potassium: 4.2 mEq/L (ref 3.5–5.1)
Potassium: 4.3 mEq/L (ref 3.5–5.1)
Sodium: 137 mEq/L (ref 135–145)
Sodium: 138 mEq/L (ref 135–145)
Sodium: 142 mEq/L (ref 135–145)

## 2010-12-25 LAB — MAGNESIUM
Magnesium: 2.3 mg/dL (ref 1.5–2.5)
Magnesium: 2.4 mg/dL (ref 1.5–2.5)
Magnesium: 2.4 mg/dL (ref 1.5–2.5)
Magnesium: 2.4 mg/dL (ref 1.5–2.5)
Magnesium: 2.5 mg/dL (ref 1.5–2.5)
Magnesium: 2.5 mg/dL (ref 1.5–2.5)
Magnesium: 2.5 mg/dL (ref 1.5–2.5)
Magnesium: 2.5 mg/dL (ref 1.5–2.5)
Magnesium: 2.5 mg/dL (ref 1.5–2.5)
Magnesium: 2.7 mg/dL — ABNORMAL HIGH (ref 1.5–2.5)

## 2010-12-25 LAB — RENAL FUNCTION PANEL
Albumin: 1.6 g/dL — ABNORMAL LOW (ref 3.5–5.2)
Albumin: 1.7 g/dL — ABNORMAL LOW (ref 3.5–5.2)
Albumin: 1.7 g/dL — ABNORMAL LOW (ref 3.5–5.2)
Albumin: 1.7 g/dL — ABNORMAL LOW (ref 3.5–5.2)
Albumin: 1.7 g/dL — ABNORMAL LOW (ref 3.5–5.2)
Albumin: 1.8 g/dL — ABNORMAL LOW (ref 3.5–5.2)
Albumin: 1.8 g/dL — ABNORMAL LOW (ref 3.5–5.2)
Albumin: 1.9 g/dL — ABNORMAL LOW (ref 3.5–5.2)
Albumin: 1.9 g/dL — ABNORMAL LOW (ref 3.5–5.2)
Albumin: 1.9 g/dL — ABNORMAL LOW (ref 3.5–5.2)
Albumin: 2.2 g/dL — ABNORMAL LOW (ref 3.5–5.2)
Albumin: 2.2 g/dL — ABNORMAL LOW (ref 3.5–5.2)
Albumin: 2.3 g/dL — ABNORMAL LOW (ref 3.5–5.2)
Albumin: 2.3 g/dL — ABNORMAL LOW (ref 3.5–5.2)
Albumin: 2.3 g/dL — ABNORMAL LOW (ref 3.5–5.2)
Albumin: 2.4 g/dL — ABNORMAL LOW (ref 3.5–5.2)
Albumin: 2.4 g/dL — ABNORMAL LOW (ref 3.5–5.2)
BUN: 102 mg/dL — ABNORMAL HIGH (ref 6–23)
BUN: 50 mg/dL — ABNORMAL HIGH (ref 6–23)
BUN: 52 mg/dL — ABNORMAL HIGH (ref 6–23)
BUN: 53 mg/dL — ABNORMAL HIGH (ref 6–23)
BUN: 54 mg/dL — ABNORMAL HIGH (ref 6–23)
BUN: 55 mg/dL — ABNORMAL HIGH (ref 6–23)
BUN: 57 mg/dL — ABNORMAL HIGH (ref 6–23)
BUN: 58 mg/dL — ABNORMAL HIGH (ref 6–23)
BUN: 60 mg/dL — ABNORMAL HIGH (ref 6–23)
BUN: 62 mg/dL — ABNORMAL HIGH (ref 6–23)
BUN: 68 mg/dL — ABNORMAL HIGH (ref 6–23)
BUN: 68 mg/dL — ABNORMAL HIGH (ref 6–23)
BUN: 76 mg/dL — ABNORMAL HIGH (ref 6–23)
BUN: 80 mg/dL — ABNORMAL HIGH (ref 6–23)
BUN: 89 mg/dL — ABNORMAL HIGH (ref 6–23)
BUN: 92 mg/dL — ABNORMAL HIGH (ref 6–23)
CO2: 21 mEq/L (ref 19–32)
CO2: 22 mEq/L (ref 19–32)
CO2: 22 mEq/L (ref 19–32)
CO2: 23 mEq/L (ref 19–32)
CO2: 23 mEq/L (ref 19–32)
CO2: 23 meq/L (ref 19–32)
CO2: 24 mEq/L (ref 19–32)
CO2: 24 mEq/L (ref 19–32)
CO2: 24 mEq/L (ref 19–32)
CO2: 25 mEq/L (ref 19–32)
CO2: 25 mEq/L (ref 19–32)
CO2: 25 mEq/L (ref 19–32)
CO2: 26 mEq/L (ref 19–32)
CO2: 26 mEq/L (ref 19–32)
CO2: 26 mEq/L (ref 19–32)
CO2: 26 mEq/L (ref 19–32)
CO2: 26 mEq/L (ref 19–32)
CO2: 27 mEq/L (ref 19–32)
CO2: 28 mEq/L (ref 19–32)
CO2: 28 mEq/L (ref 19–32)
Calcium: 7.8 mg/dL — ABNORMAL LOW (ref 8.4–10.5)
Calcium: 8 mg/dL — ABNORMAL LOW (ref 8.4–10.5)
Calcium: 8.1 mg/dL — ABNORMAL LOW (ref 8.4–10.5)
Calcium: 8.1 mg/dL — ABNORMAL LOW (ref 8.4–10.5)
Calcium: 8.2 mg/dL — ABNORMAL LOW (ref 8.4–10.5)
Calcium: 8.3 mg/dL — ABNORMAL LOW (ref 8.4–10.5)
Calcium: 8.4 mg/dL (ref 8.4–10.5)
Calcium: 8.4 mg/dL (ref 8.4–10.5)
Calcium: 8.4 mg/dL (ref 8.4–10.5)
Calcium: 8.5 mg/dL (ref 8.4–10.5)
Calcium: 8.6 mg/dL (ref 8.4–10.5)
Calcium: 8.6 mg/dL (ref 8.4–10.5)
Calcium: 8.7 mg/dL (ref 8.4–10.5)
Calcium: 9.2 mg/dL (ref 8.4–10.5)
Chloride: 100 mEq/L (ref 96–112)
Chloride: 100 mEq/L (ref 96–112)
Chloride: 100 mEq/L (ref 96–112)
Chloride: 101 mEq/L (ref 96–112)
Chloride: 102 mEq/L (ref 96–112)
Chloride: 102 mEq/L (ref 96–112)
Chloride: 103 mEq/L (ref 96–112)
Chloride: 103 mEq/L (ref 96–112)
Chloride: 103 mEq/L (ref 96–112)
Chloride: 105 mEq/L (ref 96–112)
Chloride: 106 mEq/L (ref 96–112)
Chloride: 106 mEq/L (ref 96–112)
Chloride: 95 mEq/L — ABNORMAL LOW (ref 96–112)
Chloride: 96 mEq/L (ref 96–112)
Chloride: 96 mEq/L (ref 96–112)
Chloride: 98 mEq/L (ref 96–112)
Chloride: 99 mEq/L (ref 96–112)
Chloride: 99 mEq/L (ref 96–112)
Chloride: 99 mEq/L (ref 96–112)
Creatinine, Ser: 10.84 mg/dL — ABNORMAL HIGH (ref 0.4–1.5)
Creatinine, Ser: 11.29 mg/dL — ABNORMAL HIGH (ref 0.4–1.5)
Creatinine, Ser: 13.05 mg/dL — ABNORMAL HIGH (ref 0.4–1.5)
Creatinine, Ser: 5.15 mg/dL — ABNORMAL HIGH (ref 0.4–1.5)
Creatinine, Ser: 5.78 mg/dL — ABNORMAL HIGH (ref 0.4–1.5)
Creatinine, Ser: 6.35 mg/dL — ABNORMAL HIGH (ref 0.4–1.5)
Creatinine, Ser: 6.73 mg/dL — ABNORMAL HIGH (ref 0.4–1.5)
Creatinine, Ser: 6.75 mg/dL — ABNORMAL HIGH (ref 0.4–1.5)
Creatinine, Ser: 7.67 mg/dL — ABNORMAL HIGH (ref 0.4–1.5)
Creatinine, Ser: 7.83 mg/dL — ABNORMAL HIGH (ref 0.4–1.5)
Creatinine, Ser: 7.99 mg/dL — ABNORMAL HIGH (ref 0.4–1.5)
Creatinine, Ser: 8.31 mg/dL — ABNORMAL HIGH (ref 0.4–1.5)
Creatinine, Ser: 8.38 mg/dL — ABNORMAL HIGH (ref 0.4–1.5)
Creatinine, Ser: 8.81 mg/dL — ABNORMAL HIGH (ref 0.4–1.5)
Creatinine, Ser: 9.15 mg/dL — ABNORMAL HIGH (ref 0.4–1.5)
Creatinine, Ser: 9.74 mg/dL — ABNORMAL HIGH (ref 0.4–1.5)
GFR calc Af Amer: 11 mL/min — ABNORMAL LOW (ref >60)
GFR calc Af Amer: 11 mL/min — ABNORMAL LOW (ref >60)
GFR calc Af Amer: 11 mL/min — ABNORMAL LOW (ref >60)
GFR calc Af Amer: 12 mL/min — ABNORMAL LOW (ref >60)
GFR calc Af Amer: 13 mL/min — ABNORMAL LOW (ref >60)
GFR calc Af Amer: 15 mL/min — ABNORMAL LOW (ref >60)
GFR calc Af Amer: 5 mL/min — ABNORMAL LOW (ref >60)
GFR calc Af Amer: 5 mL/min — ABNORMAL LOW (ref >60)
GFR calc Af Amer: 6 mL/min — ABNORMAL LOW (ref >60)
GFR calc Af Amer: 7 mL/min — ABNORMAL LOW (ref >60)
GFR calc Af Amer: 7 mL/min — ABNORMAL LOW (ref >60)
GFR calc Af Amer: 8 mL/min — ABNORMAL LOW (ref >60)
GFR calc Af Amer: 8 mL/min — ABNORMAL LOW (ref >60)
GFR calc Af Amer: 8 mL/min — ABNORMAL LOW (ref >60)
GFR calc Af Amer: 8 mL/min — ABNORMAL LOW (ref >60)
GFR calc Af Amer: 9 mL/min — ABNORMAL LOW (ref >60)
GFR calc Af Amer: 9 mL/min — ABNORMAL LOW (ref >60)
GFR calc Af Amer: 9 mL/min — ABNORMAL LOW (ref >60)
GFR calc non Af Amer: 10 mL/min — ABNORMAL LOW (ref >60)
GFR calc non Af Amer: 10 mL/min — ABNORMAL LOW (ref >60)
GFR calc non Af Amer: 10 mL/min — ABNORMAL LOW (ref >60)
GFR calc non Af Amer: 11 mL/min — ABNORMAL LOW (ref >60)
GFR calc non Af Amer: 12 mL/min — ABNORMAL LOW (ref >60)
GFR calc non Af Amer: 4 mL/min — ABNORMAL LOW (ref >60)
GFR calc non Af Amer: 4 mL/min — ABNORMAL LOW (ref >60)
GFR calc non Af Amer: 5 mL/min — ABNORMAL LOW (ref >60)
GFR calc non Af Amer: 6 mL/min — ABNORMAL LOW (ref >60)
GFR calc non Af Amer: 6 mL/min — ABNORMAL LOW (ref >60)
GFR calc non Af Amer: 6 mL/min — ABNORMAL LOW (ref >60)
GFR calc non Af Amer: 7 mL/min — ABNORMAL LOW (ref >60)
GFR calc non Af Amer: 7 mL/min — ABNORMAL LOW (ref >60)
GFR calc non Af Amer: 7 mL/min — ABNORMAL LOW (ref >60)
GFR calc non Af Amer: 7 mL/min — ABNORMAL LOW (ref >60)
GFR calc non Af Amer: 8 mL/min — ABNORMAL LOW (ref >60)
GFR calc non Af Amer: 9 mL/min — ABNORMAL LOW (ref >60)
GFR calc non Af Amer: 9 mL/min — ABNORMAL LOW (ref >60)
GFR calc non Af Amer: 9 mL/min — ABNORMAL LOW (ref >60)
Glucose, Bld: 101 mg/dL — ABNORMAL HIGH (ref 70–99)
Glucose, Bld: 118 mg/dL — ABNORMAL HIGH (ref 70–99)
Glucose, Bld: 120 mg/dL — ABNORMAL HIGH (ref 70–99)
Glucose, Bld: 127 mg/dL — ABNORMAL HIGH (ref 70–99)
Glucose, Bld: 134 mg/dL — ABNORMAL HIGH (ref 70–99)
Glucose, Bld: 138 mg/dL — ABNORMAL HIGH (ref 70–99)
Glucose, Bld: 143 mg/dL — ABNORMAL HIGH (ref 70–99)
Glucose, Bld: 149 mg/dL — ABNORMAL HIGH (ref 70–99)
Glucose, Bld: 174 mg/dL — ABNORMAL HIGH (ref 70–99)
Glucose, Bld: 174 mg/dL — ABNORMAL HIGH (ref 70–99)
Glucose, Bld: 202 mg/dL — ABNORMAL HIGH (ref 70–99)
Glucose, Bld: 215 mg/dL — ABNORMAL HIGH (ref 70–99)
Glucose, Bld: 216 mg/dL — ABNORMAL HIGH (ref 70–99)
Glucose, Bld: 248 mg/dL — ABNORMAL HIGH (ref 70–99)
Glucose, Bld: 268 mg/dL — ABNORMAL HIGH (ref 70–99)
Glucose, Bld: 287 mg/dL — ABNORMAL HIGH (ref 70–99)
Glucose, Bld: 310 mg/dL — ABNORMAL HIGH (ref 70–99)
Glucose, Bld: 313 mg/dL — ABNORMAL HIGH (ref 70–99)
Glucose, Bld: 93 mg/dL (ref 70–99)
Glucose, Bld: 97 mg/dL (ref 70–99)
Phosphorus: 2.6 mg/dL (ref 2.3–4.6)
Phosphorus: 2.6 mg/dL (ref 2.3–4.6)
Phosphorus: 3.2 mg/dL (ref 2.3–4.6)
Phosphorus: 4.4 mg/dL (ref 2.3–4.6)
Phosphorus: 4.6 mg/dL (ref 2.3–4.6)
Phosphorus: 5 mg/dL — ABNORMAL HIGH (ref 2.3–4.6)
Phosphorus: 5.2 mg/dL — ABNORMAL HIGH (ref 2.3–4.6)
Phosphorus: 5.7 mg/dL — ABNORMAL HIGH (ref 2.3–4.6)
Phosphorus: 5.7 mg/dL — ABNORMAL HIGH (ref 2.3–4.6)
Phosphorus: 5.9 mg/dL — ABNORMAL HIGH (ref 2.3–4.6)
Phosphorus: 6.3 mg/dL — ABNORMAL HIGH (ref 2.3–4.6)
Phosphorus: 6.5 mg/dL — ABNORMAL HIGH (ref 2.3–4.6)
Phosphorus: 6.7 mg/dL — ABNORMAL HIGH (ref 2.3–4.6)
Phosphorus: 7.8 mg/dL — ABNORMAL HIGH (ref 2.3–4.6)
Phosphorus: 8 mg/dL — ABNORMAL HIGH (ref 2.3–4.6)
Phosphorus: 8.7 mg/dL — ABNORMAL HIGH (ref 2.3–4.6)
Potassium: 3.5 mEq/L (ref 3.5–5.1)
Potassium: 3.6 mEq/L (ref 3.5–5.1)
Potassium: 3.6 mEq/L (ref 3.5–5.1)
Potassium: 3.7 mEq/L (ref 3.5–5.1)
Potassium: 3.7 mEq/L (ref 3.5–5.1)
Potassium: 3.8 mEq/L (ref 3.5–5.1)
Potassium: 3.8 mEq/L (ref 3.5–5.1)
Potassium: 3.9 mEq/L (ref 3.5–5.1)
Potassium: 3.9 mEq/L (ref 3.5–5.1)
Potassium: 4 mEq/L (ref 3.5–5.1)
Potassium: 4.2 mEq/L (ref 3.5–5.1)
Potassium: 4.3 mEq/L (ref 3.5–5.1)
Potassium: 4.3 mEq/L (ref 3.5–5.1)
Potassium: 4.3 mEq/L (ref 3.5–5.1)
Potassium: 4.3 mEq/L (ref 3.5–5.1)
Potassium: 4.4 mEq/L (ref 3.5–5.1)
Potassium: 4.6 mEq/L (ref 3.5–5.1)
Potassium: 4.6 mEq/L (ref 3.5–5.1)
Potassium: 4.7 mEq/L (ref 3.5–5.1)
Potassium: 5 mEq/L (ref 3.5–5.1)
Potassium: 5 mEq/L (ref 3.5–5.1)
Potassium: 5 mEq/L (ref 3.5–5.1)
Sodium: 131 mEq/L — ABNORMAL LOW (ref 135–145)
Sodium: 132 mEq/L — ABNORMAL LOW (ref 135–145)
Sodium: 134 mEq/L — ABNORMAL LOW (ref 135–145)
Sodium: 135 mEq/L (ref 135–145)
Sodium: 136 mEq/L (ref 135–145)
Sodium: 137 mEq/L (ref 135–145)
Sodium: 137 mEq/L (ref 135–145)
Sodium: 137 mEq/L (ref 135–145)
Sodium: 138 mEq/L (ref 135–145)
Sodium: 138 mEq/L (ref 135–145)
Sodium: 138 mEq/L (ref 135–145)
Sodium: 138 mEq/L (ref 135–145)
Sodium: 139 mEq/L (ref 135–145)
Sodium: 140 meq/L (ref 135–145)

## 2010-12-25 LAB — CLOSTRIDIUM DIFFICILE EIA
C difficile Toxins A+B, EIA: NEGATIVE
C difficile Toxins A+B, EIA: NEGATIVE

## 2010-12-25 LAB — HEPATITIS B SURFACE ANTIGEN: Hepatitis B Surface Ag: NEGATIVE

## 2010-12-25 LAB — BODY FLUID CELL COUNT WITH DIFFERENTIAL
Eos, Fluid: 3 %
Lymphs, Fluid: 17 %
Monocyte-Macrophage-Serous Fluid: 14 % — ABNORMAL LOW (ref 50–90)

## 2010-12-25 LAB — COMPREHENSIVE METABOLIC PANEL
ALT: 10 U/L (ref 0–53)
ALT: 25 U/L (ref 0–53)
AST: 102 U/L — ABNORMAL HIGH (ref 0–37)
AST: 105 U/L — ABNORMAL HIGH (ref 0–37)
AST: 150 U/L — ABNORMAL HIGH (ref 0–37)
AST: 34 U/L (ref 0–37)
AST: 51 U/L — ABNORMAL HIGH (ref 0–37)
Albumin: 1.6 g/dL — ABNORMAL LOW (ref 3.5–5.2)
Albumin: 1.7 g/dL — ABNORMAL LOW (ref 3.5–5.2)
Albumin: 1.8 g/dL — ABNORMAL LOW (ref 3.5–5.2)
Albumin: 1.8 g/dL — ABNORMAL LOW (ref 3.5–5.2)
Albumin: 2.3 g/dL — ABNORMAL LOW (ref 3.5–5.2)
Albumin: 2.4 g/dL — ABNORMAL LOW (ref 3.5–5.2)
Alkaline Phosphatase: 173 U/L — ABNORMAL HIGH (ref 39–117)
Alkaline Phosphatase: 190 U/L — ABNORMAL HIGH (ref 39–117)
Alkaline Phosphatase: 81 U/L (ref 39–117)
BUN: 56 mg/dL — ABNORMAL HIGH (ref 6–23)
BUN: 59 mg/dL — ABNORMAL HIGH (ref 6–23)
CO2: 23 mEq/L (ref 19–32)
CO2: 27 mEq/L (ref 19–32)
Calcium: 7.7 mg/dL — ABNORMAL LOW (ref 8.4–10.5)
Calcium: 8.1 mg/dL — ABNORMAL LOW (ref 8.4–10.5)
Calcium: 8.7 mg/dL (ref 8.4–10.5)
Chloride: 104 mEq/L (ref 96–112)
Chloride: 93 mEq/L — ABNORMAL LOW (ref 96–112)
Chloride: 96 mEq/L (ref 96–112)
Chloride: 99 mEq/L (ref 96–112)
Creatinine, Ser: 5.32 mg/dL — ABNORMAL HIGH (ref 0.4–1.5)
Creatinine, Ser: 6.28 mg/dL — ABNORMAL HIGH (ref 0.4–1.5)
Creatinine, Ser: 8.12 mg/dL — ABNORMAL HIGH (ref 0.4–1.5)
Creatinine, Ser: 9.6 mg/dL — ABNORMAL HIGH (ref 0.4–1.5)
GFR calc Af Amer: 10 mL/min — ABNORMAL LOW (ref >60)
GFR calc Af Amer: 12 mL/min — ABNORMAL LOW (ref >60)
GFR calc Af Amer: 14 mL/min — ABNORMAL LOW (ref >60)
GFR calc Af Amer: 14 mL/min — ABNORMAL LOW (ref >60)
GFR calc Af Amer: 7 mL/min — ABNORMAL LOW (ref >60)
GFR calc non Af Amer: 10 mL/min — ABNORMAL LOW (ref >60)
GFR calc non Af Amer: 6 mL/min — ABNORMAL LOW (ref >60)
GFR calc non Af Amer: 6 mL/min — ABNORMAL LOW (ref >60)
Glucose, Bld: 123 mg/dL — ABNORMAL HIGH (ref 70–99)
Glucose, Bld: 154 mg/dL — ABNORMAL HIGH (ref 70–99)
Glucose, Bld: 382 mg/dL — ABNORMAL HIGH (ref 70–99)
Potassium: 4 mEq/L (ref 3.5–5.1)
Potassium: 4.1 mEq/L (ref 3.5–5.1)
Potassium: 4.4 mEq/L (ref 3.5–5.1)
Sodium: 133 mEq/L — ABNORMAL LOW (ref 135–145)
Sodium: 139 mEq/L (ref 135–145)
Total Bilirubin: 0.9 mg/dL (ref 0.3–1.2)
Total Bilirubin: 2.2 mg/dL — ABNORMAL HIGH (ref 0.3–1.2)
Total Bilirubin: 3.4 mg/dL — ABNORMAL HIGH (ref 0.3–1.2)
Total Protein: 6.5 g/dL (ref 6.0–8.3)
Total Protein: 7 g/dL (ref 6.0–8.3)
Total Protein: 7.1 g/dL (ref 6.0–8.3)

## 2010-12-25 LAB — CULTURE, BLOOD (ROUTINE X 2)
Culture: NO GROWTH
Culture: NO GROWTH
Culture: NO GROWTH

## 2010-12-25 LAB — BASIC METABOLIC PANEL WITH GFR
BUN: 61 mg/dL — ABNORMAL HIGH (ref 6–23)
Calcium: 8.6 mg/dL (ref 8.4–10.5)
Creatinine, Ser: 8.25 mg/dL — ABNORMAL HIGH (ref 0.4–1.5)
GFR calc non Af Amer: 7 mL/min — ABNORMAL LOW (ref >60)
Glucose, Bld: 192 mg/dL — ABNORMAL HIGH (ref 70–99)

## 2010-12-25 LAB — CULTURE, RESPIRATORY W GRAM STAIN

## 2010-12-25 LAB — BLOOD GAS, ARTERIAL
Bicarbonate: 23.6 mEq/L (ref 20.0–24.0)
pCO2 arterial: 43.4 mmHg (ref 35.0–45.0)
pH, Arterial: 7.354 (ref 7.350–7.450)
pO2, Arterial: 84.7 mmHg (ref 80.0–100.0)

## 2010-12-25 LAB — PROTIME-INR
INR: 1.3 (ref 0.00–1.49)
INR: 1.31 (ref 0.00–1.49)
Prothrombin Time: 16.1 s — ABNORMAL HIGH (ref 11.6–15.2)

## 2010-12-25 LAB — FUNGUS CULTURE W SMEAR

## 2010-12-25 LAB — APTT
aPTT: 30 seconds (ref 24–37)
aPTT: 30 seconds (ref 24–37)
aPTT: 31 s (ref 24–37)
aPTT: 31 seconds (ref 24–37)
aPTT: 33 seconds (ref 24–37)
aPTT: 34 seconds (ref 24–37)
aPTT: 38 seconds — ABNORMAL HIGH (ref 24–37)
aPTT: 93 seconds — ABNORMAL HIGH (ref 24–37)

## 2010-12-25 LAB — PHOSPHORUS
Phosphorus: 3.7 mg/dL (ref 2.3–4.6)
Phosphorus: 3.7 mg/dL (ref 2.3–4.6)
Phosphorus: 6.6 mg/dL — ABNORMAL HIGH (ref 2.3–4.6)

## 2010-12-25 LAB — HEPATITIS B CORE ANTIBODY, TOTAL: Hep B Core Total Ab: NEGATIVE

## 2010-12-25 LAB — AFB CULTURE WITH SMEAR (NOT AT ARMC): Acid Fast Smear: NONE SEEN

## 2010-12-25 LAB — URINALYSIS, ROUTINE W REFLEX MICROSCOPIC
Ketones, ur: 15 mg/dL — AB
Nitrite: POSITIVE — AB
Protein, ur: >300 mg/dL — AB
pH: 5 (ref 5.0–8.0)

## 2010-12-25 LAB — URINE CULTURE: Colony Count: NO GROWTH

## 2010-12-25 LAB — CULTURE, BAL-QUANTITATIVE W GRAM STAIN: Colony Count: 1000

## 2010-12-25 LAB — LACTATE DEHYDROGENASE: LDH: 955 U/L — ABNORMAL HIGH (ref 94–250)

## 2010-12-25 LAB — HEPARIN LEVEL (UNFRACTIONATED)
Heparin Unfractionated: 0.16 IU/mL — ABNORMAL LOW (ref 0.30–0.70)
Heparin Unfractionated: 0.16 IU/mL — ABNORMAL LOW (ref 0.30–0.70)
Heparin Unfractionated: 0.22 IU/mL — ABNORMAL LOW (ref 0.30–0.70)
Heparin Unfractionated: 0.28 IU/mL — ABNORMAL LOW (ref 0.30–0.70)

## 2010-12-25 LAB — FERRITIN: Ferritin: 3628 ng/mL — ABNORMAL HIGH (ref 22–322)

## 2010-12-25 LAB — HEMOGLOBIN AND HEMATOCRIT, BLOOD
HCT: 21.2 % — ABNORMAL LOW (ref 39.0–52.0)
HCT: 21.8 % — ABNORMAL LOW (ref 39.0–52.0)
Hemoglobin: 7.3 g/dL — ABNORMAL LOW (ref 13.0–17.0)
Hemoglobin: 7.9 g/dL — ABNORMAL LOW (ref 13.0–17.0)

## 2010-12-25 LAB — IRON AND TIBC
Saturation Ratios: 36 % (ref 20–55)
TIBC: 242 ug/dL (ref 215–435)
UIBC: 117 ug/dL

## 2010-12-25 LAB — TECHNOLOGIST SMEAR REVIEW

## 2010-12-25 LAB — POCT I-STAT 3, VENOUS BLOOD GAS (G3P V)
Acid-Base Excess: 13 mmol/L — ABNORMAL HIGH (ref 0.0–2.0)
Bicarbonate: 39.1 mEq/L — ABNORMAL HIGH (ref 20.0–24.0)
O2 Saturation: 60 %
Patient temperature: 101.4
TCO2: 41 mmol/L (ref 0–100)

## 2010-12-25 LAB — HEMOCCULT GUIAC POC 1CARD (OFFICE)
Fecal Occult Bld: NEGATIVE
Fecal Occult Bld: NEGATIVE

## 2010-12-25 LAB — PREPARE RBC (CROSSMATCH)

## 2010-12-25 LAB — BILIRUBIN, FRACTIONATED(TOT/DIR/INDIR): Indirect Bilirubin: 0.9 mg/dL (ref 0.3–0.9)

## 2010-12-25 LAB — ABO/RH: ABO/RH(D): A POS

## 2010-12-25 LAB — ALT: ALT: 13 U/L (ref 0–53)

## 2010-12-25 LAB — MRSA PCR SCREENING: MRSA by PCR: NEGATIVE

## 2010-12-25 LAB — PATHOLOGIST SMEAR REVIEW

## 2010-12-25 LAB — LIPASE, BLOOD: Lipase: 133 U/L — ABNORMAL HIGH (ref 11–59)

## 2010-12-25 LAB — GRAM STAIN

## 2010-12-29 LAB — GLUCOSE, CAPILLARY
Glucose-Capillary: 101 mg/dL — ABNORMAL HIGH (ref 70–99)
Glucose-Capillary: 101 mg/dL — ABNORMAL HIGH (ref 70–99)
Glucose-Capillary: 107 mg/dL — ABNORMAL HIGH (ref 70–99)
Glucose-Capillary: 107 mg/dL — ABNORMAL HIGH (ref 70–99)
Glucose-Capillary: 107 mg/dL — ABNORMAL HIGH (ref 70–99)
Glucose-Capillary: 107 mg/dL — ABNORMAL HIGH (ref 70–99)
Glucose-Capillary: 109 mg/dL — ABNORMAL HIGH (ref 70–99)
Glucose-Capillary: 109 mg/dL — ABNORMAL HIGH (ref 70–99)
Glucose-Capillary: 110 mg/dL — ABNORMAL HIGH (ref 70–99)
Glucose-Capillary: 110 mg/dL — ABNORMAL HIGH (ref 70–99)
Glucose-Capillary: 115 mg/dL — ABNORMAL HIGH (ref 70–99)
Glucose-Capillary: 116 mg/dL — ABNORMAL HIGH (ref 70–99)
Glucose-Capillary: 117 mg/dL — ABNORMAL HIGH (ref 70–99)
Glucose-Capillary: 119 mg/dL — ABNORMAL HIGH (ref 70–99)
Glucose-Capillary: 120 mg/dL — ABNORMAL HIGH (ref 70–99)
Glucose-Capillary: 120 mg/dL — ABNORMAL HIGH (ref 70–99)
Glucose-Capillary: 121 mg/dL — ABNORMAL HIGH (ref 70–99)
Glucose-Capillary: 121 mg/dL — ABNORMAL HIGH (ref 70–99)
Glucose-Capillary: 122 mg/dL — ABNORMAL HIGH (ref 70–99)
Glucose-Capillary: 123 mg/dL — ABNORMAL HIGH (ref 70–99)
Glucose-Capillary: 125 mg/dL — ABNORMAL HIGH (ref 70–99)
Glucose-Capillary: 128 mg/dL — ABNORMAL HIGH (ref 70–99)
Glucose-Capillary: 128 mg/dL — ABNORMAL HIGH (ref 70–99)
Glucose-Capillary: 129 mg/dL — ABNORMAL HIGH (ref 70–99)
Glucose-Capillary: 129 mg/dL — ABNORMAL HIGH (ref 70–99)
Glucose-Capillary: 129 mg/dL — ABNORMAL HIGH (ref 70–99)
Glucose-Capillary: 129 mg/dL — ABNORMAL HIGH (ref 70–99)
Glucose-Capillary: 129 mg/dL — ABNORMAL HIGH (ref 70–99)
Glucose-Capillary: 134 mg/dL — ABNORMAL HIGH (ref 70–99)
Glucose-Capillary: 137 mg/dL — ABNORMAL HIGH (ref 70–99)
Glucose-Capillary: 137 mg/dL — ABNORMAL HIGH (ref 70–99)
Glucose-Capillary: 138 mg/dL — ABNORMAL HIGH (ref 70–99)
Glucose-Capillary: 139 mg/dL — ABNORMAL HIGH (ref 70–99)
Glucose-Capillary: 140 mg/dL — ABNORMAL HIGH (ref 70–99)
Glucose-Capillary: 140 mg/dL — ABNORMAL HIGH (ref 70–99)
Glucose-Capillary: 141 mg/dL — ABNORMAL HIGH (ref 70–99)
Glucose-Capillary: 142 mg/dL — ABNORMAL HIGH (ref 70–99)
Glucose-Capillary: 142 mg/dL — ABNORMAL HIGH (ref 70–99)
Glucose-Capillary: 145 mg/dL — ABNORMAL HIGH (ref 70–99)
Glucose-Capillary: 146 mg/dL — ABNORMAL HIGH (ref 70–99)
Glucose-Capillary: 148 mg/dL — ABNORMAL HIGH (ref 70–99)
Glucose-Capillary: 151 mg/dL — ABNORMAL HIGH (ref 70–99)
Glucose-Capillary: 152 mg/dL — ABNORMAL HIGH (ref 70–99)
Glucose-Capillary: 152 mg/dL — ABNORMAL HIGH (ref 70–99)
Glucose-Capillary: 159 mg/dL — ABNORMAL HIGH (ref 70–99)
Glucose-Capillary: 175 mg/dL — ABNORMAL HIGH (ref 70–99)
Glucose-Capillary: 179 mg/dL — ABNORMAL HIGH (ref 70–99)
Glucose-Capillary: 202 mg/dL — ABNORMAL HIGH (ref 70–99)
Glucose-Capillary: 90 mg/dL (ref 70–99)
Glucose-Capillary: 94 mg/dL (ref 70–99)
Glucose-Capillary: 95 mg/dL (ref 70–99)

## 2010-12-29 LAB — CBC
HCT: 20 % — ABNORMAL LOW (ref 39.0–52.0)
HCT: 20.5 % — ABNORMAL LOW (ref 39.0–52.0)
HCT: 20.8 % — ABNORMAL LOW (ref 39.0–52.0)
HCT: 21.6 % — ABNORMAL LOW (ref 39.0–52.0)
HCT: 22.3 % — ABNORMAL LOW (ref 39.0–52.0)
HCT: 23.1 % — ABNORMAL LOW (ref 39.0–52.0)
HCT: 23.1 % — ABNORMAL LOW (ref 39.0–52.0)
HCT: 23.8 % — ABNORMAL LOW (ref 39.0–52.0)
HCT: 24.5 % — ABNORMAL LOW (ref 39.0–52.0)
HCT: 24.8 % — ABNORMAL LOW (ref 39.0–52.0)
Hemoglobin: 6.6 g/dL — CL (ref 13.0–17.0)
Hemoglobin: 6.8 g/dL — CL (ref 13.0–17.0)
Hemoglobin: 6.9 g/dL — CL (ref 13.0–17.0)
Hemoglobin: 7 g/dL — ABNORMAL LOW (ref 13.0–17.0)
Hemoglobin: 7 g/dL — ABNORMAL LOW (ref 13.0–17.0)
Hemoglobin: 7.4 g/dL — ABNORMAL LOW (ref 13.0–17.0)
Hemoglobin: 7.5 g/dL — ABNORMAL LOW (ref 13.0–17.0)
Hemoglobin: 7.8 g/dL — ABNORMAL LOW (ref 13.0–17.0)
Hemoglobin: 7.8 g/dL — ABNORMAL LOW (ref 13.0–17.0)
Hemoglobin: 7.9 g/dL — ABNORMAL LOW (ref 13.0–17.0)
Hemoglobin: 8 g/dL — ABNORMAL LOW (ref 13.0–17.0)
Hemoglobin: 8.4 g/dL — ABNORMAL LOW (ref 13.0–17.0)
Hemoglobin: 8.5 g/dL — ABNORMAL LOW (ref 13.0–17.0)
Hemoglobin: 9 g/dL — ABNORMAL LOW (ref 13.0–17.0)
MCHC: 31.7 g/dL (ref 30.0–36.0)
MCHC: 33.1 g/dL (ref 30.0–36.0)
MCHC: 33.3 g/dL (ref 30.0–36.0)
MCHC: 33.6 g/dL (ref 30.0–36.0)
MCHC: 33.8 g/dL (ref 30.0–36.0)
MCHC: 33.8 g/dL (ref 30.0–36.0)
MCHC: 33.8 g/dL (ref 30.0–36.0)
MCHC: 33.9 g/dL (ref 30.0–36.0)
MCHC: 34 g/dL (ref 30.0–36.0)
MCHC: 34.3 g/dL (ref 30.0–36.0)
MCHC: 34.3 g/dL (ref 30.0–36.0)
MCHC: 34.5 g/dL (ref 30.0–36.0)
MCHC: 34.6 g/dL (ref 30.0–36.0)
MCV: 94.3 fL (ref 78.0–100.0)
MCV: 94.8 fL (ref 78.0–100.0)
MCV: 95.2 fL (ref 78.0–100.0)
MCV: 95.8 fL (ref 78.0–100.0)
MCV: 96.1 fL (ref 78.0–100.0)
MCV: 96.1 fL (ref 78.0–100.0)
MCV: 96.4 fL (ref 78.0–100.0)
MCV: 96.5 fL (ref 78.0–100.0)
MCV: 96.6 fL (ref 78.0–100.0)
MCV: 97 fL (ref 78.0–100.0)
MCV: 97.3 fL (ref 78.0–100.0)
MCV: 97.3 fL (ref 78.0–100.0)
MCV: 97.6 fL (ref 78.0–100.0)
Platelets: 269 10*3/uL (ref 150–400)
Platelets: 300 10*3/uL (ref 150–400)
Platelets: 306 10*3/uL (ref 150–400)
Platelets: 313 10*3/uL (ref 150–400)
Platelets: 324 10*3/uL (ref 150–400)
Platelets: 326 10*3/uL (ref 150–400)
Platelets: 367 10*3/uL (ref 150–400)
Platelets: 404 10*3/uL — ABNORMAL HIGH (ref 150–400)
Platelets: 419 10*3/uL — ABNORMAL HIGH (ref 150–400)
RBC: 2.03 MIL/uL — ABNORMAL LOW (ref 4.22–5.81)
RBC: 2.06 MIL/uL — ABNORMAL LOW (ref 4.22–5.81)
RBC: 2.15 MIL/uL — ABNORMAL LOW (ref 4.22–5.81)
RBC: 2.16 MIL/uL — ABNORMAL LOW (ref 4.22–5.81)
RBC: 2.21 MIL/uL — ABNORMAL LOW (ref 4.22–5.81)
RBC: 2.23 MIL/uL — ABNORMAL LOW (ref 4.22–5.81)
RBC: 2.25 MIL/uL — ABNORMAL LOW (ref 4.22–5.81)
RBC: 2.31 MIL/uL — ABNORMAL LOW (ref 4.22–5.81)
RBC: 2.34 MIL/uL — ABNORMAL LOW (ref 4.22–5.81)
RBC: 2.38 MIL/uL — ABNORMAL LOW (ref 4.22–5.81)
RBC: 2.43 MIL/uL — ABNORMAL LOW (ref 4.22–5.81)
RBC: 2.46 MIL/uL — ABNORMAL LOW (ref 4.22–5.81)
RBC: 2.51 MIL/uL — ABNORMAL LOW (ref 4.22–5.81)
RBC: 2.69 MIL/uL — ABNORMAL LOW (ref 4.22–5.81)
RBC: 2.7 MIL/uL — ABNORMAL LOW (ref 4.22–5.81)
RDW: 17.6 % — ABNORMAL HIGH (ref 11.5–15.5)
RDW: 17.9 % — ABNORMAL HIGH (ref 11.5–15.5)
RDW: 18.6 % — ABNORMAL HIGH (ref 11.5–15.5)
RDW: 18.9 % — ABNORMAL HIGH (ref 11.5–15.5)
RDW: 19.1 % — ABNORMAL HIGH (ref 11.5–15.5)
RDW: 19.3 % — ABNORMAL HIGH (ref 11.5–15.5)
RDW: 20.9 % — ABNORMAL HIGH (ref 11.5–15.5)
RDW: 21.2 % — ABNORMAL HIGH (ref 11.5–15.5)
WBC: 10.3 10*3/uL (ref 4.0–10.5)
WBC: 11.8 10*3/uL — ABNORMAL HIGH (ref 4.0–10.5)
WBC: 12.4 10*3/uL — ABNORMAL HIGH (ref 4.0–10.5)
WBC: 13 10*3/uL — ABNORMAL HIGH (ref 4.0–10.5)
WBC: 13.4 10*3/uL — ABNORMAL HIGH (ref 4.0–10.5)
WBC: 14 10*3/uL — ABNORMAL HIGH (ref 4.0–10.5)
WBC: 14 10*3/uL — ABNORMAL HIGH (ref 4.0–10.5)
WBC: 16.4 10*3/uL — ABNORMAL HIGH (ref 4.0–10.5)
WBC: 16.9 10*3/uL — ABNORMAL HIGH (ref 4.0–10.5)
WBC: 20.5 10*3/uL — ABNORMAL HIGH (ref 4.0–10.5)
WBC: 21.4 10*3/uL — ABNORMAL HIGH (ref 4.0–10.5)
WBC: 24.6 10*3/uL — ABNORMAL HIGH (ref 4.0–10.5)

## 2010-12-29 LAB — RENAL FUNCTION PANEL
Albumin: 2.3 g/dL — ABNORMAL LOW (ref 3.5–5.2)
Albumin: 2.5 g/dL — ABNORMAL LOW (ref 3.5–5.2)
Albumin: 2.5 g/dL — ABNORMAL LOW (ref 3.5–5.2)
Albumin: 2.5 g/dL — ABNORMAL LOW (ref 3.5–5.2)
Albumin: 2.5 g/dL — ABNORMAL LOW (ref 3.5–5.2)
Albumin: 2.6 g/dL — ABNORMAL LOW (ref 3.5–5.2)
Albumin: 2.7 g/dL — ABNORMAL LOW (ref 3.5–5.2)
BUN: 28 mg/dL — ABNORMAL HIGH (ref 6–23)
BUN: 49 mg/dL — ABNORMAL HIGH (ref 6–23)
BUN: 57 mg/dL — ABNORMAL HIGH (ref 6–23)
BUN: 65 mg/dL — ABNORMAL HIGH (ref 6–23)
BUN: 71 mg/dL — ABNORMAL HIGH (ref 6–23)
BUN: 77 mg/dL — ABNORMAL HIGH (ref 6–23)
BUN: 80 mg/dL — ABNORMAL HIGH (ref 6–23)
BUN: 91 mg/dL — ABNORMAL HIGH (ref 6–23)
CO2: 19 mEq/L (ref 19–32)
CO2: 20 mEq/L (ref 19–32)
CO2: 21 mEq/L (ref 19–32)
CO2: 22 mEq/L (ref 19–32)
CO2: 22 mEq/L (ref 19–32)
CO2: 23 mEq/L (ref 19–32)
CO2: 23 mEq/L (ref 19–32)
CO2: 24 mEq/L (ref 19–32)
CO2: 26 mEq/L (ref 19–32)
CO2: 26 mEq/L (ref 19–32)
CO2: 27 mEq/L (ref 19–32)
Calcium: 8.5 mg/dL (ref 8.4–10.5)
Calcium: 8.7 mg/dL (ref 8.4–10.5)
Calcium: 8.7 mg/dL (ref 8.4–10.5)
Calcium: 8.7 mg/dL (ref 8.4–10.5)
Calcium: 8.7 mg/dL (ref 8.4–10.5)
Calcium: 8.7 mg/dL (ref 8.4–10.5)
Calcium: 8.7 mg/dL (ref 8.4–10.5)
Calcium: 8.9 mg/dL (ref 8.4–10.5)
Calcium: 9.1 mg/dL (ref 8.4–10.5)
Calcium: 9.2 mg/dL (ref 8.4–10.5)
Chloride: 101 mEq/L (ref 96–112)
Chloride: 93 mEq/L — ABNORMAL LOW (ref 96–112)
Chloride: 94 mEq/L — ABNORMAL LOW (ref 96–112)
Chloride: 95 mEq/L — ABNORMAL LOW (ref 96–112)
Chloride: 96 mEq/L (ref 96–112)
Chloride: 96 mEq/L (ref 96–112)
Chloride: 98 mEq/L (ref 96–112)
Creatinine, Ser: 10.8 mg/dL — ABNORMAL HIGH (ref 0.4–1.5)
Creatinine, Ser: 11.31 mg/dL — ABNORMAL HIGH (ref 0.4–1.5)
Creatinine, Ser: 12.46 mg/dL — ABNORMAL HIGH (ref 0.4–1.5)
Creatinine, Ser: 14.06 mg/dL — ABNORMAL HIGH (ref 0.4–1.5)
Creatinine, Ser: 15.74 mg/dL — ABNORMAL HIGH (ref 0.4–1.5)
Creatinine, Ser: 18.63 mg/dL — ABNORMAL HIGH (ref 0.4–1.5)
Creatinine, Ser: 22 mg/dL — ABNORMAL HIGH (ref 0.4–1.5)
Creatinine, Ser: 7.22 mg/dL — ABNORMAL HIGH (ref 0.4–1.5)
GFR calc Af Amer: 3 mL/min — ABNORMAL LOW (ref >60)
GFR calc Af Amer: 3 mL/min — ABNORMAL LOW (ref >60)
GFR calc Af Amer: 4 mL/min — ABNORMAL LOW (ref >60)
GFR calc Af Amer: 5 mL/min — ABNORMAL LOW (ref >60)
GFR calc Af Amer: 5 mL/min — ABNORMAL LOW (ref >60)
GFR calc Af Amer: 6 mL/min — ABNORMAL LOW (ref >60)
GFR calc Af Amer: 6 mL/min — ABNORMAL LOW (ref >60)
GFR calc Af Amer: 7 mL/min — ABNORMAL LOW (ref >60)
GFR calc non Af Amer: 2 mL/min — ABNORMAL LOW (ref >60)
GFR calc non Af Amer: 3 mL/min — ABNORMAL LOW (ref >60)
GFR calc non Af Amer: 3 mL/min — ABNORMAL LOW (ref >60)
GFR calc non Af Amer: 4 mL/min — ABNORMAL LOW (ref >60)
GFR calc non Af Amer: 4 mL/min — ABNORMAL LOW (ref >60)
GFR calc non Af Amer: 4 mL/min — ABNORMAL LOW (ref >60)
GFR calc non Af Amer: 5 mL/min — ABNORMAL LOW (ref >60)
GFR calc non Af Amer: 5 mL/min — ABNORMAL LOW (ref >60)
GFR calc non Af Amer: 8 mL/min — ABNORMAL LOW (ref >60)
Glucose, Bld: 109 mg/dL — ABNORMAL HIGH (ref 70–99)
Glucose, Bld: 111 mg/dL — ABNORMAL HIGH (ref 70–99)
Glucose, Bld: 115 mg/dL — ABNORMAL HIGH (ref 70–99)
Glucose, Bld: 129 mg/dL — ABNORMAL HIGH (ref 70–99)
Glucose, Bld: 134 mg/dL — ABNORMAL HIGH (ref 70–99)
Glucose, Bld: 136 mg/dL — ABNORMAL HIGH (ref 70–99)
Glucose, Bld: 141 mg/dL — ABNORMAL HIGH (ref 70–99)
Glucose, Bld: 146 mg/dL — ABNORMAL HIGH (ref 70–99)
Glucose, Bld: 160 mg/dL — ABNORMAL HIGH (ref 70–99)
Phosphorus: 4.2 mg/dL (ref 2.3–4.6)
Phosphorus: 4.2 mg/dL (ref 2.3–4.6)
Phosphorus: 5.3 mg/dL — ABNORMAL HIGH (ref 2.3–4.6)
Phosphorus: 5.8 mg/dL — ABNORMAL HIGH (ref 2.3–4.6)
Phosphorus: 6.4 mg/dL — ABNORMAL HIGH (ref 2.3–4.6)
Phosphorus: 6.6 mg/dL — ABNORMAL HIGH (ref 2.3–4.6)
Phosphorus: 6.8 mg/dL — ABNORMAL HIGH (ref 2.3–4.6)
Phosphorus: 6.9 mg/dL — ABNORMAL HIGH (ref 2.3–4.6)
Phosphorus: 7.5 mg/dL — ABNORMAL HIGH (ref 2.3–4.6)
Potassium: 4.4 mEq/L (ref 3.5–5.1)
Potassium: 4.4 mEq/L (ref 3.5–5.1)
Potassium: 4.6 mEq/L (ref 3.5–5.1)
Potassium: 4.7 mEq/L (ref 3.5–5.1)
Potassium: 4.8 mEq/L (ref 3.5–5.1)
Potassium: 4.9 mEq/L (ref 3.5–5.1)
Sodium: 129 mEq/L — ABNORMAL LOW (ref 135–145)
Sodium: 129 mEq/L — ABNORMAL LOW (ref 135–145)
Sodium: 132 mEq/L — ABNORMAL LOW (ref 135–145)
Sodium: 132 mEq/L — ABNORMAL LOW (ref 135–145)
Sodium: 133 mEq/L — ABNORMAL LOW (ref 135–145)
Sodium: 133 mEq/L — ABNORMAL LOW (ref 135–145)
Sodium: 133 mEq/L — ABNORMAL LOW (ref 135–145)
Sodium: 134 mEq/L — ABNORMAL LOW (ref 135–145)
Sodium: 135 mEq/L (ref 135–145)

## 2010-12-29 LAB — CROSSMATCH
ABO/RH(D): A POS
ABO/RH(D): A POS
Antibody Screen: NEGATIVE

## 2010-12-29 LAB — BASIC METABOLIC PANEL
BUN: 73 mg/dL — ABNORMAL HIGH (ref 6–23)
CO2: 23 mEq/L (ref 19–32)
CO2: 28 mEq/L (ref 19–32)
Calcium: 8.7 mg/dL (ref 8.4–10.5)
Calcium: 8.8 mg/dL (ref 8.4–10.5)
Calcium: 9 mg/dL (ref 8.4–10.5)
Chloride: 100 mEq/L (ref 96–112)
Chloride: 95 mEq/L — ABNORMAL LOW (ref 96–112)
Chloride: 98 mEq/L (ref 96–112)
Creatinine, Ser: 11.48 mg/dL — ABNORMAL HIGH (ref 0.4–1.5)
Creatinine, Ser: 15.2 mg/dL — ABNORMAL HIGH (ref 0.4–1.5)
GFR calc Af Amer: 10 mL/min — ABNORMAL LOW (ref >60)
GFR calc Af Amer: 4 mL/min — ABNORMAL LOW (ref >60)
GFR calc Af Amer: 6 mL/min — ABNORMAL LOW (ref >60)
GFR calc non Af Amer: 4 mL/min — ABNORMAL LOW (ref >60)
GFR calc non Af Amer: 4 mL/min — ABNORMAL LOW (ref >60)
Potassium: 4.6 mEq/L (ref 3.5–5.1)
Sodium: 130 mEq/L — ABNORMAL LOW (ref 135–145)
Sodium: 133 mEq/L — ABNORMAL LOW (ref 135–145)
Sodium: 135 mEq/L (ref 135–145)

## 2010-12-29 LAB — HEMOGLOBIN A1C
Hgb A1c MFr Bld: 6.2 % — ABNORMAL HIGH (ref 4.6–6.1)
Mean Plasma Glucose: 131 mg/dL

## 2010-12-29 LAB — DIFFERENTIAL
Basophils Absolute: 0.1 10*3/uL (ref 0.0–0.1)
Eosinophils Relative: 2 % (ref 0–5)
Lymphocytes Relative: 26 % (ref 12–46)
Lymphs Abs: 2.7 10*3/uL (ref 0.7–4.0)
Neutro Abs: 6.1 10*3/uL (ref 1.7–7.7)

## 2010-12-29 LAB — DRUGS OF ABUSE SCREEN W/O ALC, ROUTINE URINE
Amphetamine Screen, Ur: NEGATIVE
Barbiturate Quant, Ur: NEGATIVE
Cocaine Metabolites: NEGATIVE
Creatinine,U: 128.3 mg/dL
Propoxyphene: NEGATIVE

## 2010-12-29 LAB — MAGNESIUM: Magnesium: 1.7 mg/dL (ref 1.5–2.5)

## 2010-12-29 LAB — CULTURE, BLOOD (ROUTINE X 2)
Culture: NO GROWTH
Culture: NO GROWTH

## 2010-12-29 LAB — BLOOD GAS, ARTERIAL
Bicarbonate: 24.7 mEq/L — ABNORMAL HIGH (ref 20.0–24.0)
FIO2: 0.21 %
O2 Saturation: 96.1 %
TCO2: 25.9 mmol/L (ref 0–100)
pO2, Arterial: 64.8 mmHg — ABNORMAL LOW (ref 80.0–100.0)

## 2010-12-29 LAB — TSH: TSH: 4.534 u[IU]/mL — ABNORMAL HIGH (ref 0.350–4.500)

## 2010-12-29 LAB — T4, FREE: Free T4: 1.11 ng/dL (ref 0.80–1.80)

## 2011-02-08 ENCOUNTER — Emergency Department (HOSPITAL_COMMUNITY)
Admission: EM | Admit: 2011-02-08 | Discharge: 2011-02-08 | Disposition: A | Discharge status: Home / Self Care | Payer: Medicare Other | Attending: Emergency Medicine | Admitting: Emergency Medicine

## 2011-02-08 DIAGNOSIS — H5710 Ocular pain, unspecified eye: Secondary | ICD-10-CM | POA: Insufficient documentation

## 2011-02-08 DIAGNOSIS — E119 Type 2 diabetes mellitus without complications: Secondary | ICD-10-CM | POA: Insufficient documentation

## 2011-02-08 DIAGNOSIS — Z794 Long term (current) use of insulin: Secondary | ICD-10-CM | POA: Insufficient documentation

## 2011-02-08 DIAGNOSIS — E669 Obesity, unspecified: Secondary | ICD-10-CM | POA: Insufficient documentation

## 2011-02-08 DIAGNOSIS — G4730 Sleep apnea, unspecified: Secondary | ICD-10-CM | POA: Insufficient documentation

## 2011-02-17 NOTE — Assessment & Plan Note (Signed)
OFFICE VISIT   Shane Hamilton, Shane Hamilton  DOB:  1966-06-29                                       03/25/2010  ONGEX#:[id number]   The patient returns 6 weeks post left basilic vein transposition for end-  stage renal disease.  He has been on hemodialysis since January of this  year.  He has had no pain or numbness in the left hand.  This is his  first access site.   On exam the basilic vein transposition has an excellent pulse and  palpable thrill and is easily visualized beneath the skin in the upper  arm.  There is no evidence of ischemia distally.  The wounds have healed  nicely.   I think that this should provide excellent access for this patient but  should not be used until early August to allow it plenty of time to  heal.  He will return to see [place] on a p.r.n. basis.     Quita Skye Hart [place], M.D.  Electronically Signed   JDL/MEDQ  D:  03/25/2010  T:  03/26/2010  Job:  [id number]

## 2011-04-15 ENCOUNTER — Other Ambulatory Visit (HOSPITAL_COMMUNITY): Payer: Self-pay | Admitting: Nephrology

## 2011-04-15 DIAGNOSIS — N186 End stage renal disease: Secondary | ICD-10-CM

## 2011-04-24 ENCOUNTER — Ambulatory Visit (HOSPITAL_COMMUNITY): Admission: RE | Admit: 2011-04-24 | Payer: Medicare Other | Source: Ambulatory Visit

## 2011-04-29 ENCOUNTER — Ambulatory Visit (HOSPITAL_COMMUNITY)
Admission: RE | Admit: 2011-04-29 | Discharge: 2011-04-29 | Disposition: A | Discharge status: Home / Self Care | Payer: Medicare Other | Source: Ambulatory Visit | Attending: Nephrology | Admitting: Nephrology

## 2011-04-29 ENCOUNTER — Other Ambulatory Visit (HOSPITAL_COMMUNITY): Payer: Self-pay | Admitting: Nephrology

## 2011-04-29 DIAGNOSIS — I120 Hypertensive chronic kidney disease with stage 5 chronic kidney disease or end stage renal disease: Secondary | ICD-10-CM | POA: Insufficient documentation

## 2011-04-29 DIAGNOSIS — N186 End stage renal disease: Secondary | ICD-10-CM

## 2011-04-29 DIAGNOSIS — T82898A Other specified complication of vascular prosthetic devices, implants and grafts, initial encounter: Secondary | ICD-10-CM | POA: Insufficient documentation

## 2011-04-29 DIAGNOSIS — E119 Type 2 diabetes mellitus without complications: Secondary | ICD-10-CM | POA: Insufficient documentation

## 2011-04-29 DIAGNOSIS — D649 Anemia, unspecified: Secondary | ICD-10-CM | POA: Insufficient documentation

## 2011-04-29 DIAGNOSIS — Y832 Surgical operation with anastomosis, bypass or graft as the cause of abnormal reaction of the patient, or of later complication, without mention of misadventure at the time of the procedure: Secondary | ICD-10-CM | POA: Insufficient documentation

## 2011-04-29 DIAGNOSIS — Z86718 Personal history of other venous thrombosis and embolism: Secondary | ICD-10-CM | POA: Insufficient documentation

## 2011-04-29 DIAGNOSIS — Z86711 Personal history of pulmonary embolism: Secondary | ICD-10-CM | POA: Insufficient documentation

## 2011-04-29 DIAGNOSIS — G4733 Obstructive sleep apnea (adult) (pediatric): Secondary | ICD-10-CM | POA: Insufficient documentation

## 2011-04-29 MED ORDER — IOHEXOL 300 MG/ML  SOLN
100.0000 mL | Freq: Once | INTRAMUSCULAR | Status: AC | PRN
  Administered 2011-04-29: 50 mL via INTRAVENOUS

## 2011-05-21 ENCOUNTER — Other Ambulatory Visit (HOSPITAL_COMMUNITY): Payer: Self-pay | Admitting: Nephrology

## 2011-05-21 DIAGNOSIS — Z992 Dependence on renal dialysis: Secondary | ICD-10-CM

## 2011-05-27 ENCOUNTER — Ambulatory Visit (HOSPITAL_COMMUNITY)
Admission: RE | Admit: 2011-05-27 | Discharge: 2011-05-27 | Disposition: A | Discharge status: Home / Self Care | Payer: Medicare Other | Source: Ambulatory Visit | Attending: Nephrology | Admitting: Nephrology

## 2011-05-27 ENCOUNTER — Other Ambulatory Visit (HOSPITAL_COMMUNITY): Payer: Self-pay | Admitting: Nephrology

## 2011-05-27 DIAGNOSIS — Y832 Surgical operation with anastomosis, bypass or graft as the cause of abnormal reaction of the patient, or of later complication, without mention of misadventure at the time of the procedure: Secondary | ICD-10-CM | POA: Insufficient documentation

## 2011-05-27 DIAGNOSIS — N186 End stage renal disease: Secondary | ICD-10-CM

## 2011-05-27 DIAGNOSIS — I871 Compression of vein: Secondary | ICD-10-CM | POA: Insufficient documentation

## 2011-05-27 DIAGNOSIS — T82898A Other specified complication of vascular prosthetic devices, implants and grafts, initial encounter: Secondary | ICD-10-CM | POA: Insufficient documentation

## 2011-05-27 MED ORDER — IOHEXOL 300 MG/ML  SOLN
100.0000 mL | Freq: Once | INTRAMUSCULAR | Status: AC | PRN
  Administered 2011-05-27: 75 mL via INTRA_ARTERIAL

## 2011-06-01 ENCOUNTER — Inpatient Hospital Stay (HOSPITAL_COMMUNITY): Admission: RE | Admit: 2011-06-01 | Payer: Medicare Other | Source: Ambulatory Visit

## 2011-08-05 ENCOUNTER — Ambulatory Visit (INDEPENDENT_AMBULATORY_CARE_PROVIDER_SITE_OTHER): Payer: Medicare Other | Admitting: Internal Medicine

## 2011-08-05 ENCOUNTER — Encounter: Payer: Self-pay | Admitting: Internal Medicine

## 2011-08-05 VITALS — BP 140/92 | HR 118 | Ht 71.0 in | Wt 399.0 lb

## 2011-08-05 DIAGNOSIS — G4733 Obstructive sleep apnea (adult) (pediatric): Secondary | ICD-10-CM

## 2011-08-05 NOTE — Progress Notes (Signed)
08/05/11- 45 yoM never smoker with hx loud snoring, disrupted sleep. Prior NPSG in the past, done elsewwhere, indicated severe OSA, but he couldn't tolerate intitial trial of CPAP and didn't follow through.  Dibetes with hx diabetic coma requiring tracheostomy/ ventilator January, 2012. ESRD dialysis. Now referred by Dr. Darrick Penna for complaint of insomnia with sleep apnea. He naps during and after dialysis and fights daytime sleepiness through most days. Sleep is fragmented at night. On dialysis days, he gets up between 3 and 4 AM to get ready for dialysis at 6 AM. Loud snoring. He has always been heavy. Bedtime varies as does sleep latency. He wakes 4 or more times per night before finally up in the morning. No history of ENT surgery except that he had tracheostomy when hospitalized for diabetic coma in January of 2012. Has had flu shot. Disabled [employer] veteran living alone.  ROS-see HPI Constitutional:   No-   weight loss, night sweats, fevers, chills, +fatigue, lassitude. HEENT:   No-  headaches, difficulty swallowing, tooth/dental problems, sore throat,       No-  sneezing, itching, ear ache, nasal congestion, post nasal drip,  CV:  No-   chest pain, orthopnea, PND, swelling in lower extremities, anasarca, dizziness, palpitations Resp: No-   shortness of breath with exertion or at rest.              No-   productive cough,  No non-productive cough,  No- coughing up of blood.              No-   change in color of mucus.  No- wheezing.   Skin: No-   rash or lesions. GI:  No-   heartburn, indigestion, abdominal pain, nausea, vomiting, diarrhea,                 change in bowel habits, loss of appetite GU: No-   dysuria, change in color of urine, no urgency or frequency.  No- flank pain. MS:  No-   joint pain or swelling.  No- decreased range of motion.  No- back pain. Neuro-     nothing unusual Psych:  No- change in mood or affect. No depression or anxiety.  No memory loss.  OBJ General-  Alert, Oriented, Affect-appropriate, Distress- none acute Morbidly obese Skin- rash-none, lesions- none, excoriation- none Lymphadenopathy- none Head- atraumatic            Eyes- Gross vision intact, PERRLA, conjunctivae clear secretions            Ears- Hearing, canals-normal            Nose- Clear, no-Septal dev, mucus, polyps, erosion, perforation             Throat- Mallampati IV , mucosa clear , drainage- none, tonsils- atrophic Neck- flexible , trachea midline, no stridor , thyroid nl, carotid no bruit Chest - symmetrical excursion , unlabored           Heart/CV- RRR , no murmur , no gallop  , no rub, nl s1 s2                           - JVD- none , edema- none, stasis changes- none, varices- none           Lung- clear to P&A, wheeze- none, cough- none , dullness-none, rub- none           Chest wall-  Abd- tender-no, distended-no, bowel sounds-present, HSM-  no Br/ Gen/ Rectal- Not done, not indicated Extrem- cyanosis- none, clubbing, none, atrophy- none, strength- nl. Right hand in elastic sleeve "complication of diabetes" Neuro- grossly intact to observation. He fell asleep and started snoring while sitting in his chair waiting for me to complete my note.

## 2011-08-05 NOTE — Patient Instructions (Signed)
Order- schedule NPSG split protocol- not on a night before dialysis

## 2011-08-06 DIAGNOSIS — G4733 Obstructive sleep apnea (adult) (pediatric): Secondary | ICD-10-CM | POA: Insufficient documentation

## 2011-08-06 NOTE — Assessment & Plan Note (Signed)
We discussed and will schedule a sleep study for him. We discussed his weight, driving safety and the medical consequences of untreated sleep apnea. His sleep pattern is disturbed by the need to get up early on some days for dialysis and by his lack of further structure.

## 2011-08-23 ENCOUNTER — Ambulatory Visit (HOSPITAL_BASED_OUTPATIENT_CLINIC_OR_DEPARTMENT_OTHER): Payer: Medicare Other | Attending: Internal Medicine | Admitting: Radiology

## 2011-08-23 VITALS — Ht 71.0 in | Wt 350.0 lb

## 2011-08-23 DIAGNOSIS — G4733 Obstructive sleep apnea (adult) (pediatric): Secondary | ICD-10-CM | POA: Insufficient documentation

## 2011-08-23 DIAGNOSIS — Z79899 Other long term (current) drug therapy: Secondary | ICD-10-CM | POA: Insufficient documentation

## 2011-08-29 DIAGNOSIS — G4733 Obstructive sleep apnea (adult) (pediatric): Secondary | ICD-10-CM

## 2011-08-29 DIAGNOSIS — Z79899 Other long term (current) drug therapy: Secondary | ICD-10-CM

## 2011-08-29 NOTE — Procedures (Signed)
NAME:  Shane Hamilton, Shane Hamilton             ACCOUNT NO.:  [id number]  MEDICAL RECORD NO.:  [id number]          PATIENT TYPE:  OUT  LOCATION:  SLEEP CENTER                 FACILITY:  [facility]  PHYSICIAN:  Mara Favero D. Maple Hudson, MD, FCCP, FACPDATE OF BIRTH:  08/26/1967  DATE OF STUDY:  08/23/2011                           NOCTURNAL POLYSOMNOGRAM  REFERRING PHYSICIAN:  Tian Mcmurtrey D. Maple Hudson, MD, FCCP, FACP  REFERRING DOCTOR:  Hollan Philipp D. Evalynne Locurto, MD, FCCP, FACP  INDICATION FOR STUDY:  Hypersomnia with sleep apnea.  EPWORTH SLEEPINESS SCORE:  17/24.  BMI 48.8, weight 350 pounds, height 71 inches, neck 19 inches.  MEDICATIONS:  Home medications are charted and reviewed.  SLEEP ARCHITECTURE:  Split study protocol.  During the diagnostic phase, total sleep time 121 minutes with sleep efficiency 85.8%.  Stage I was 7%, stage II 88%, stage III absent, REM 5% of total sleep time.  Sleep latency 0.5 minutes, REM latency 132.5 minutes, awake after sleep onset 19.5 minutes, arousal index 99.2.  Bedtime medication: Clonazepam.  RESPIRATORY DATA:  Split study protocol.  Apnea-hypopnea index (AHI) 111.1 per hour.  A total of 224 events was scored including 125 obstructive apneas, 11 central apneas, 85 mixed apneas, 3 hypopneas. Events were not positional.  REM AHI 100 per hour.  CPAP was then titrated to 22 CWP, AHI 0 per hour.  He wore a medium ResMed Quattro FX full-face mask with heated humidifier.  OXYGEN DATA:  Snoring was moderately loud before CPAP with oxygen desaturation to a nadir of 54% on room air.  With CPAP titration, mean oxygen saturation held 88.8% on room air and snoring was prevented.  CARDIAC DATA:  Sinus rhythm.  MOVEMENT-PARASOMNIA:  No significant movement disturbance.  Bathroom x2. Frequent position changes were noted on the video review prior to CPAP. With CPAP titration, sleep was much less active.  IMPRESSIONS-RECOMMENDATIONS: 1. Severe obstructive sleep apnea/hypopnea syndrome,  apnea/hypopnea     index 111.1 per hour with non-positional events, moderately loud     snoring and oxygen desaturation to a nadir of 54% on room air. 2. Successful continuous positive airway pressure titration to 22 cm     of water pressure, apnea/hypopnea index 0 per hour.  He wore a     medium ResMed Quattro FX full-face mask with heated humidifier.     Oxygenation was     improved, but will need to review in the home environment to insure     adequate oxygenation with CPAP alone.  Snoring was prevented by     CPAP.     Chistina Roston D. Maple Hudson, MD, [facility], FACP Diplomate, Biomedical engineer of Sleep Medicine Electronically Signed    CDY/MEDQ  D:  08/29/2011 07:52:10  T:  08/29/2011 08:10:59  Job:  [id number]

## 2011-09-01 ENCOUNTER — Telehealth: Payer: Self-pay | Admitting: Internal Medicine

## 2011-09-01 NOTE — Telephone Encounter (Signed)
Per [facility], they were able to get dictation from [facility] and nothing further is needed from our office.

## 2011-09-04 ENCOUNTER — Encounter: Payer: Self-pay | Admitting: Internal Medicine

## 2011-09-04 ENCOUNTER — Ambulatory Visit (INDEPENDENT_AMBULATORY_CARE_PROVIDER_SITE_OTHER): Payer: Medicare Other | Admitting: Internal Medicine

## 2011-09-04 VITALS — BP 140/88 | HR 108 | Ht 71.0 in | Wt >= 6400 oz

## 2011-09-04 DIAGNOSIS — G4733 Obstructive sleep apnea (adult) (pediatric): Secondary | ICD-10-CM

## 2011-09-04 NOTE — Assessment & Plan Note (Signed)
We discussed weight, driving responsibility, and treatment options. He wishes to start CPAP.

## 2011-09-04 NOTE — Patient Instructions (Addendum)
Order- new CPAP/ BiPAP 22 cwp, mask of choice, humidifier     Dx OSA  Please call the home care company or [place] as needed till we get the CPAP comfortable for you.

## 2011-09-04 NOTE — Progress Notes (Signed)
08/05/11- 45 yoM never smoker with hx loud snoring, disrupted sleep. Prior NPSG in the past, done elsewwhere, indicated severe OSA, but he couldn't tolerate intitial trial of CPAP and didn't follow through.  Dibetes with hx diabetic coma requiring tracheostomy/ ventilator January, 2012. ESRD dialysis. Now referred by Dr. Darrick Penna for complaint of insomnia with sleep apnea. He naps during and after dialysis and fights daytime sleepiness through most days. Sleep is fragmented at night. On dialysis days, he gets up between 3 and 4 AM to get ready for dialysis at 6 AM. Loud snoring. He has always been heavy. Bedtime varies as does sleep latency. He wakes 4 or more times per night before finally up in the morning. No history of ENT surgery except that he had tracheostomy when hospitalized for diabetic coma in January of 2012. Has had flu shot. Disabled [employer] veteran living alone.  11.30/12- 41 yoM never smoker with hx loud snoring, disrupted sleep. Prior NPSG in the past, done elsewhere, indicated severe OSA, but he couldn't tolerate intitial trial of CPAP and didn't follow through. ESRD/ dialysis, DM, hx trach/vent for diabetic coma. Returns for follow up of Obstructive Sleep Apnea NPSG 08/23/11- AHI 111.1/ hr with loud snoring, desaturation to 54%, indicating severe OSA. CPAP titration required 22 cwp pressure for adequate control. Lower pressures could not control his apnea. This will require a BIPAP machine as discussed.   ROS-see HPI Constitutional:   No-   weight loss, night sweats, fevers, chills, +fatigue, lassitude. HEENT:   No-  headaches, difficulty swallowing, tooth/dental problems, sore throat,       No-  sneezing, itching, ear ache, nasal congestion, post nasal drip,  CV:  No-   chest pain, orthopnea, PND, swelling in lower extremities, anasarca, dizziness, palpitations Resp: No-   shortness of breath with exertion or at rest.              No-   productive cough,  No non-productive  cough,  No- coughing up of blood.              No-   change in color of mucus.  No- wheezing.   Skin: No-   rash or lesions. GI:  No-   heartburn, indigestion, abdominal pain, nausea, vomiting, diarrhea,                 change in bowel habits, loss of appetite GU: No-   dysuria, change in color of urine, no urgency or frequency.  No- flank pain. MS:  No-   joint pain or swelling.  No- decreased range of motion.  No- back pain. Neuro-     nothing unusual Psych:  No- change in mood or affect. No depression or anxiety.  No memory loss.  OBJ General- Alert, Oriented, Affect-appropriate, Distress- none;  Morbidly obese; drowsy Skin- rash-none, lesions- none, excoriation- none Lymphadenopathy- none Head- atraumatic            Eyes- Gross vision intact, PERRLA, conjunctivae clear secretions            Ears- Hearing, canals-normal            Nose- Clear, no-Septal dev, mucus, polyps, erosion, perforation             Throat- Mallampati IV , mucosa clear , drainage- none, tonsils- atrophic Neck- flexible , trachea midline, no stridor , thyroid nl, carotid no bruit Chest - symmetrical excursion , unlabored           Heart/CV- RRR ,  no murmur , no gallop  , no rub, nl s1 s2                           - JVD- none , edema- none, stasis changes- none, varices- none           Lung- clear to P&A, wheeze- none, cough- none , dullness-none, rub- none           Chest wall-  Abd- tender-no, distended-no, bowel sounds-present, HSM- no Br/ Gen/ Rectal- Not done, not indicated Extrem- cyanosis- none, clubbing, none, atrophy- none, strength- nl. Right hand in elastic sleeve "complication of diabetes" Neuro- grossly intact to observation.Drowsy but attentive and appropriate.

## 2011-09-07 ENCOUNTER — Telehealth: Payer: Self-pay | Admitting: Internal Medicine

## 2011-09-07 DIAGNOSIS — G4733 Obstructive sleep apnea (adult) (pediatric): Secondary | ICD-10-CM

## 2011-09-07 MED ORDER — TEMAZEPAM 30 MG PO CAPS: 30.0000 mg | ORAL_CAPSULE | Freq: Every evening | ORAL | Status: AC | PRN

## 2011-09-07 NOTE — Telephone Encounter (Signed)
Called and spoke with pt. He is aware we are decreasing his pressure to 20cwp and also re-evaluate mask fit and also rx sent to pharmacy.  Pt verbalized understanding.

## 2011-09-07 NOTE — Telephone Encounter (Signed)
LMOM for pt TCB to get more info.   

## 2011-09-07 NOTE — Telephone Encounter (Signed)
Per CY-Rx mask of choice and supplies; reduce pressure to 20 cwp for trial.

## 2011-09-07 NOTE — Telephone Encounter (Signed)
Called and spoke with pt.  Order was sent by CY to [facility] for CPAP/BIPAP to be set on 22cwp.  Pt states pressure is too high. Having difficulty breathing while wearing machine.  States air leaks out of his mask and blows in his eyes.  Pt states she spoke with rep from [facility] and was told that the mask pt currently has will not tolerate a pressure of 22.  Pt is requesting a lower pressure.  Pt also states he has difficulty falling asleep , can take anywhere from 20 to 40 mins to fall asleep.  Requesting something to help him sleep and get used to machine.  CY, please advise.  Thanks. Allergies  Allergen Reactions  . Aspirin

## 2011-09-07 NOTE — Telephone Encounter (Signed)
Called and spoke with Ethelle Lyon, manger of [facility]. Questioning as to why RT fitted patient with this mask if the mask wouldn't tolerate a pressure of 22 cwp. Order faxed to [facility] for them to re-evaluate mask fit and to reduce BiPap pressure to 20 cwp as a trial.

## 2011-09-07 NOTE — Telephone Encounter (Signed)
Per CY-offer Temazepam 30 mg #30 take 1 po qhs prn sleep. No refills .

## 2011-09-07 NOTE — Telephone Encounter (Signed)
Will route back to CDY for recs of medication for sleep.

## 2011-09-09 ENCOUNTER — Other Ambulatory Visit (HOSPITAL_COMMUNITY): Payer: Self-pay | Admitting: Nephrology

## 2011-09-09 DIAGNOSIS — N186 End stage renal disease: Secondary | ICD-10-CM

## 2011-09-14 ENCOUNTER — Ambulatory Visit (HOSPITAL_COMMUNITY)
Admission: RE | Admit: 2011-09-14 | Discharge: 2011-09-14 | Disposition: A | Discharge status: Home / Self Care | Payer: Medicare Other | Source: Ambulatory Visit | Attending: Nephrology | Admitting: Nephrology

## 2011-09-14 ENCOUNTER — Other Ambulatory Visit (HOSPITAL_COMMUNITY): Payer: Self-pay | Admitting: Nephrology

## 2011-09-14 DIAGNOSIS — N186 End stage renal disease: Secondary | ICD-10-CM

## 2011-09-14 DIAGNOSIS — T82898A Other specified complication of vascular prosthetic devices, implants and grafts, initial encounter: Secondary | ICD-10-CM | POA: Insufficient documentation

## 2011-09-14 DIAGNOSIS — Y849 Medical procedure, unspecified as the cause of abnormal reaction of the patient, or of later complication, without mention of misadventure at the time of the procedure: Secondary | ICD-10-CM | POA: Insufficient documentation

## 2011-09-14 MED ORDER — IOHEXOL 300 MG/ML  SOLN
100.0000 mL | Freq: Once | INTRAMUSCULAR | Status: AC | PRN
  Administered 2011-09-14: 50 mL via INTRAVENOUS

## 2011-09-14 NOTE — Procedures (Signed)
Technically successful angioplasty of recurrent venous anastomosis stenosis.  No immediate post procedural complications.

## 2011-10-06 DIAGNOSIS — N186 End stage renal disease: Secondary | ICD-10-CM | POA: Diagnosis not present

## 2011-10-08 DIAGNOSIS — N2581 Secondary hyperparathyroidism of renal origin: Secondary | ICD-10-CM | POA: Diagnosis not present

## 2011-10-08 DIAGNOSIS — N186 End stage renal disease: Secondary | ICD-10-CM | POA: Diagnosis not present

## 2011-10-08 DIAGNOSIS — E119 Type 2 diabetes mellitus without complications: Secondary | ICD-10-CM | POA: Diagnosis not present

## 2011-10-08 DIAGNOSIS — N039 Chronic nephritic syndrome with unspecified morphologic changes: Secondary | ICD-10-CM | POA: Diagnosis not present

## 2011-10-08 DIAGNOSIS — D631 Anemia in chronic kidney disease: Secondary | ICD-10-CM | POA: Diagnosis not present

## 2011-10-08 DIAGNOSIS — Z992 Dependence on renal dialysis: Secondary | ICD-10-CM | POA: Diagnosis not present

## 2011-10-08 DIAGNOSIS — D509 Iron deficiency anemia, unspecified: Secondary | ICD-10-CM | POA: Diagnosis not present

## 2011-10-10 DIAGNOSIS — N2581 Secondary hyperparathyroidism of renal origin: Secondary | ICD-10-CM | POA: Diagnosis not present

## 2011-10-10 DIAGNOSIS — Z992 Dependence on renal dialysis: Secondary | ICD-10-CM | POA: Diagnosis not present

## 2011-10-10 DIAGNOSIS — D631 Anemia in chronic kidney disease: Secondary | ICD-10-CM | POA: Diagnosis not present

## 2011-10-10 DIAGNOSIS — N186 End stage renal disease: Secondary | ICD-10-CM | POA: Diagnosis not present

## 2011-10-10 DIAGNOSIS — D509 Iron deficiency anemia, unspecified: Secondary | ICD-10-CM | POA: Diagnosis not present

## 2011-10-10 DIAGNOSIS — D689 Coagulation defect, unspecified: Secondary | ICD-10-CM | POA: Diagnosis not present

## 2011-10-13 DIAGNOSIS — D509 Iron deficiency anemia, unspecified: Secondary | ICD-10-CM | POA: Diagnosis not present

## 2011-10-13 DIAGNOSIS — Z992 Dependence on renal dialysis: Secondary | ICD-10-CM | POA: Diagnosis not present

## 2011-10-13 DIAGNOSIS — D631 Anemia in chronic kidney disease: Secondary | ICD-10-CM | POA: Diagnosis not present

## 2011-10-13 DIAGNOSIS — N186 End stage renal disease: Secondary | ICD-10-CM | POA: Diagnosis not present

## 2011-10-13 DIAGNOSIS — N2581 Secondary hyperparathyroidism of renal origin: Secondary | ICD-10-CM | POA: Diagnosis not present

## 2011-10-15 DIAGNOSIS — N186 End stage renal disease: Secondary | ICD-10-CM | POA: Diagnosis not present

## 2011-10-15 DIAGNOSIS — N2581 Secondary hyperparathyroidism of renal origin: Secondary | ICD-10-CM | POA: Diagnosis not present

## 2011-10-15 DIAGNOSIS — D509 Iron deficiency anemia, unspecified: Secondary | ICD-10-CM | POA: Diagnosis not present

## 2011-10-15 DIAGNOSIS — Z992 Dependence on renal dialysis: Secondary | ICD-10-CM | POA: Diagnosis not present

## 2011-10-15 DIAGNOSIS — D631 Anemia in chronic kidney disease: Secondary | ICD-10-CM | POA: Diagnosis not present

## 2011-10-16 ENCOUNTER — Ambulatory Visit: Payer: Medicare Other | Admitting: Internal Medicine

## 2011-10-17 DIAGNOSIS — N039 Chronic nephritic syndrome with unspecified morphologic changes: Secondary | ICD-10-CM | POA: Diagnosis not present

## 2011-10-17 DIAGNOSIS — D509 Iron deficiency anemia, unspecified: Secondary | ICD-10-CM | POA: Diagnosis not present

## 2011-10-17 DIAGNOSIS — Z992 Dependence on renal dialysis: Secondary | ICD-10-CM | POA: Diagnosis not present

## 2011-10-17 DIAGNOSIS — N2581 Secondary hyperparathyroidism of renal origin: Secondary | ICD-10-CM | POA: Diagnosis not present

## 2011-10-17 DIAGNOSIS — N186 End stage renal disease: Secondary | ICD-10-CM | POA: Diagnosis not present

## 2011-10-20 DIAGNOSIS — N186 End stage renal disease: Secondary | ICD-10-CM | POA: Diagnosis not present

## 2011-10-20 DIAGNOSIS — Z992 Dependence on renal dialysis: Secondary | ICD-10-CM | POA: Diagnosis not present

## 2011-10-20 DIAGNOSIS — N2581 Secondary hyperparathyroidism of renal origin: Secondary | ICD-10-CM | POA: Diagnosis not present

## 2011-10-20 DIAGNOSIS — D509 Iron deficiency anemia, unspecified: Secondary | ICD-10-CM | POA: Diagnosis not present

## 2011-10-20 DIAGNOSIS — I4891 Unspecified atrial fibrillation: Secondary | ICD-10-CM | POA: Diagnosis not present

## 2011-10-20 DIAGNOSIS — I6350 Cerebral infarction due to unspecified occlusion or stenosis of unspecified cerebral artery: Secondary | ICD-10-CM | POA: Diagnosis not present

## 2011-10-20 DIAGNOSIS — N039 Chronic nephritic syndrome with unspecified morphologic changes: Secondary | ICD-10-CM | POA: Diagnosis not present

## 2011-10-22 ENCOUNTER — Other Ambulatory Visit (HOSPITAL_COMMUNITY): Payer: Self-pay | Admitting: Nephrology

## 2011-10-22 DIAGNOSIS — N186 End stage renal disease: Secondary | ICD-10-CM | POA: Diagnosis not present

## 2011-10-22 DIAGNOSIS — Z992 Dependence on renal dialysis: Secondary | ICD-10-CM | POA: Diagnosis not present

## 2011-10-22 DIAGNOSIS — D509 Iron deficiency anemia, unspecified: Secondary | ICD-10-CM | POA: Diagnosis not present

## 2011-10-22 DIAGNOSIS — N2581 Secondary hyperparathyroidism of renal origin: Secondary | ICD-10-CM | POA: Diagnosis not present

## 2011-10-22 DIAGNOSIS — N039 Chronic nephritic syndrome with unspecified morphologic changes: Secondary | ICD-10-CM | POA: Diagnosis not present

## 2011-10-24 DIAGNOSIS — N186 End stage renal disease: Secondary | ICD-10-CM | POA: Diagnosis not present

## 2011-10-24 DIAGNOSIS — D509 Iron deficiency anemia, unspecified: Secondary | ICD-10-CM | POA: Diagnosis not present

## 2011-10-24 DIAGNOSIS — Z992 Dependence on renal dialysis: Secondary | ICD-10-CM | POA: Diagnosis not present

## 2011-10-24 DIAGNOSIS — D631 Anemia in chronic kidney disease: Secondary | ICD-10-CM | POA: Diagnosis not present

## 2011-10-24 DIAGNOSIS — N2581 Secondary hyperparathyroidism of renal origin: Secondary | ICD-10-CM | POA: Diagnosis not present

## 2011-10-27 ENCOUNTER — Encounter (HOSPITAL_COMMUNITY): Payer: Self-pay | Admitting: Pharmacy Technician

## 2011-10-27 DIAGNOSIS — I4891 Unspecified atrial fibrillation: Secondary | ICD-10-CM | POA: Diagnosis not present

## 2011-10-27 DIAGNOSIS — E1129 Type 2 diabetes mellitus with other diabetic kidney complication: Secondary | ICD-10-CM | POA: Diagnosis not present

## 2011-10-27 DIAGNOSIS — D509 Iron deficiency anemia, unspecified: Secondary | ICD-10-CM | POA: Diagnosis not present

## 2011-10-27 DIAGNOSIS — N186 End stage renal disease: Secondary | ICD-10-CM | POA: Diagnosis not present

## 2011-10-27 DIAGNOSIS — D631 Anemia in chronic kidney disease: Secondary | ICD-10-CM | POA: Diagnosis not present

## 2011-10-27 DIAGNOSIS — N2581 Secondary hyperparathyroidism of renal origin: Secondary | ICD-10-CM | POA: Diagnosis not present

## 2011-10-27 DIAGNOSIS — Z992 Dependence on renal dialysis: Secondary | ICD-10-CM | POA: Diagnosis not present

## 2011-10-27 DIAGNOSIS — I6350 Cerebral infarction due to unspecified occlusion or stenosis of unspecified cerebral artery: Secondary | ICD-10-CM | POA: Diagnosis not present

## 2011-10-28 ENCOUNTER — Ambulatory Visit (HOSPITAL_COMMUNITY)
Admission: RE | Admit: 2011-10-28 | Discharge: 2011-10-28 | Disposition: A | Discharge status: Home / Self Care | Payer: Medicare Other | Source: Ambulatory Visit | Attending: Nephrology | Admitting: Nephrology

## 2011-10-28 ENCOUNTER — Other Ambulatory Visit (HOSPITAL_COMMUNITY): Payer: Self-pay | Admitting: Nephrology

## 2011-10-28 DIAGNOSIS — T82898A Other specified complication of vascular prosthetic devices, implants and grafts, initial encounter: Secondary | ICD-10-CM | POA: Diagnosis not present

## 2011-10-28 DIAGNOSIS — N186 End stage renal disease: Secondary | ICD-10-CM

## 2011-10-28 DIAGNOSIS — Y849 Medical procedure, unspecified as the cause of abnormal reaction of the patient, or of later complication, without mention of misadventure at the time of the procedure: Secondary | ICD-10-CM | POA: Insufficient documentation

## 2011-10-28 MED ORDER — IOHEXOL 300 MG/ML  SOLN
100.0000 mL | Freq: Once | INTRAMUSCULAR | Status: AC | PRN
  Administered 2011-10-28: 60 mL via INTRAVENOUS

## 2011-10-28 NOTE — Procedures (Signed)
L FA graft shuntogram 5mm and 7mm PTA of progressive stenoses No complication No blood loss. See complete dictation in [facility].

## 2011-10-29 ENCOUNTER — Telehealth (HOSPITAL_COMMUNITY): Payer: Self-pay

## 2011-10-29 DIAGNOSIS — D631 Anemia in chronic kidney disease: Secondary | ICD-10-CM | POA: Diagnosis not present

## 2011-10-29 DIAGNOSIS — Z992 Dependence on renal dialysis: Secondary | ICD-10-CM | POA: Diagnosis not present

## 2011-10-29 DIAGNOSIS — D509 Iron deficiency anemia, unspecified: Secondary | ICD-10-CM | POA: Diagnosis not present

## 2011-10-29 DIAGNOSIS — N186 End stage renal disease: Secondary | ICD-10-CM | POA: Diagnosis not present

## 2011-10-29 DIAGNOSIS — N2581 Secondary hyperparathyroidism of renal origin: Secondary | ICD-10-CM | POA: Diagnosis not present

## 2011-10-30 ENCOUNTER — Ambulatory Visit (HOSPITAL_COMMUNITY): Admission: RE | Admit: 2011-10-30 | Payer: Medicare Other | Source: Ambulatory Visit

## 2011-10-31 DIAGNOSIS — N039 Chronic nephritic syndrome with unspecified morphologic changes: Secondary | ICD-10-CM | POA: Diagnosis not present

## 2011-10-31 DIAGNOSIS — D509 Iron deficiency anemia, unspecified: Secondary | ICD-10-CM | POA: Diagnosis not present

## 2011-10-31 DIAGNOSIS — Z992 Dependence on renal dialysis: Secondary | ICD-10-CM | POA: Diagnosis not present

## 2011-10-31 DIAGNOSIS — N186 End stage renal disease: Secondary | ICD-10-CM | POA: Diagnosis not present

## 2011-10-31 DIAGNOSIS — N2581 Secondary hyperparathyroidism of renal origin: Secondary | ICD-10-CM | POA: Diagnosis not present

## 2011-11-03 DIAGNOSIS — Z7901 Long term (current) use of anticoagulants: Secondary | ICD-10-CM | POA: Diagnosis not present

## 2011-11-03 DIAGNOSIS — N039 Chronic nephritic syndrome with unspecified morphologic changes: Secondary | ICD-10-CM | POA: Diagnosis not present

## 2011-11-03 DIAGNOSIS — N186 End stage renal disease: Secondary | ICD-10-CM | POA: Diagnosis not present

## 2011-11-03 DIAGNOSIS — D631 Anemia in chronic kidney disease: Secondary | ICD-10-CM | POA: Diagnosis not present

## 2011-11-03 DIAGNOSIS — N2581 Secondary hyperparathyroidism of renal origin: Secondary | ICD-10-CM | POA: Diagnosis not present

## 2011-11-05 DIAGNOSIS — N039 Chronic nephritic syndrome with unspecified morphologic changes: Secondary | ICD-10-CM | POA: Diagnosis not present

## 2011-11-05 DIAGNOSIS — N186 End stage renal disease: Secondary | ICD-10-CM | POA: Diagnosis not present

## 2011-11-05 DIAGNOSIS — N2581 Secondary hyperparathyroidism of renal origin: Secondary | ICD-10-CM | POA: Diagnosis not present

## 2011-11-06 DIAGNOSIS — N186 End stage renal disease: Secondary | ICD-10-CM | POA: Diagnosis not present

## 2011-11-07 DIAGNOSIS — N2581 Secondary hyperparathyroidism of renal origin: Secondary | ICD-10-CM | POA: Diagnosis not present

## 2011-11-07 DIAGNOSIS — N039 Chronic nephritic syndrome with unspecified morphologic changes: Secondary | ICD-10-CM | POA: Diagnosis not present

## 2011-11-07 DIAGNOSIS — N186 End stage renal disease: Secondary | ICD-10-CM | POA: Diagnosis not present

## 2011-11-07 DIAGNOSIS — D631 Anemia in chronic kidney disease: Secondary | ICD-10-CM | POA: Diagnosis not present

## 2011-11-10 DIAGNOSIS — D631 Anemia in chronic kidney disease: Secondary | ICD-10-CM | POA: Diagnosis not present

## 2011-11-10 DIAGNOSIS — N186 End stage renal disease: Secondary | ICD-10-CM | POA: Diagnosis not present

## 2011-11-10 DIAGNOSIS — N2581 Secondary hyperparathyroidism of renal origin: Secondary | ICD-10-CM | POA: Diagnosis not present

## 2011-11-12 DIAGNOSIS — N186 End stage renal disease: Secondary | ICD-10-CM | POA: Diagnosis not present

## 2011-11-12 DIAGNOSIS — D631 Anemia in chronic kidney disease: Secondary | ICD-10-CM | POA: Diagnosis not present

## 2011-11-12 DIAGNOSIS — N2581 Secondary hyperparathyroidism of renal origin: Secondary | ICD-10-CM | POA: Diagnosis not present

## 2011-11-14 DIAGNOSIS — N2581 Secondary hyperparathyroidism of renal origin: Secondary | ICD-10-CM | POA: Diagnosis not present

## 2011-11-14 DIAGNOSIS — D631 Anemia in chronic kidney disease: Secondary | ICD-10-CM | POA: Diagnosis not present

## 2011-11-14 DIAGNOSIS — N186 End stage renal disease: Secondary | ICD-10-CM | POA: Diagnosis not present

## 2011-11-17 DIAGNOSIS — N2581 Secondary hyperparathyroidism of renal origin: Secondary | ICD-10-CM | POA: Diagnosis not present

## 2011-11-17 DIAGNOSIS — D509 Iron deficiency anemia, unspecified: Secondary | ICD-10-CM | POA: Diagnosis not present

## 2011-11-17 DIAGNOSIS — N186 End stage renal disease: Secondary | ICD-10-CM | POA: Diagnosis not present

## 2011-11-17 DIAGNOSIS — E119 Type 2 diabetes mellitus without complications: Secondary | ICD-10-CM | POA: Diagnosis not present

## 2011-11-17 DIAGNOSIS — D631 Anemia in chronic kidney disease: Secondary | ICD-10-CM | POA: Diagnosis not present

## 2011-11-19 DIAGNOSIS — D631 Anemia in chronic kidney disease: Secondary | ICD-10-CM | POA: Diagnosis not present

## 2011-11-19 DIAGNOSIS — D509 Iron deficiency anemia, unspecified: Secondary | ICD-10-CM | POA: Diagnosis not present

## 2011-11-19 DIAGNOSIS — N186 End stage renal disease: Secondary | ICD-10-CM | POA: Diagnosis not present

## 2011-11-19 DIAGNOSIS — E119 Type 2 diabetes mellitus without complications: Secondary | ICD-10-CM | POA: Diagnosis not present

## 2011-11-19 DIAGNOSIS — N2581 Secondary hyperparathyroidism of renal origin: Secondary | ICD-10-CM | POA: Diagnosis not present

## 2011-11-21 DIAGNOSIS — E119 Type 2 diabetes mellitus without complications: Secondary | ICD-10-CM | POA: Diagnosis not present

## 2011-11-21 DIAGNOSIS — N2581 Secondary hyperparathyroidism of renal origin: Secondary | ICD-10-CM | POA: Diagnosis not present

## 2011-11-21 DIAGNOSIS — D631 Anemia in chronic kidney disease: Secondary | ICD-10-CM | POA: Diagnosis not present

## 2011-11-21 DIAGNOSIS — D509 Iron deficiency anemia, unspecified: Secondary | ICD-10-CM | POA: Diagnosis not present

## 2011-11-21 DIAGNOSIS — N186 End stage renal disease: Secondary | ICD-10-CM | POA: Diagnosis not present

## 2011-11-24 DIAGNOSIS — N2581 Secondary hyperparathyroidism of renal origin: Secondary | ICD-10-CM | POA: Diagnosis not present

## 2011-11-24 DIAGNOSIS — E119 Type 2 diabetes mellitus without complications: Secondary | ICD-10-CM | POA: Diagnosis not present

## 2011-11-24 DIAGNOSIS — N186 End stage renal disease: Secondary | ICD-10-CM | POA: Diagnosis not present

## 2011-11-24 DIAGNOSIS — D509 Iron deficiency anemia, unspecified: Secondary | ICD-10-CM | POA: Diagnosis not present

## 2011-11-24 DIAGNOSIS — N039 Chronic nephritic syndrome with unspecified morphologic changes: Secondary | ICD-10-CM | POA: Diagnosis not present

## 2011-11-26 DIAGNOSIS — E119 Type 2 diabetes mellitus without complications: Secondary | ICD-10-CM | POA: Diagnosis not present

## 2011-11-26 DIAGNOSIS — N186 End stage renal disease: Secondary | ICD-10-CM | POA: Diagnosis not present

## 2011-11-26 DIAGNOSIS — D509 Iron deficiency anemia, unspecified: Secondary | ICD-10-CM | POA: Diagnosis not present

## 2011-11-26 DIAGNOSIS — D631 Anemia in chronic kidney disease: Secondary | ICD-10-CM | POA: Diagnosis not present

## 2011-11-26 DIAGNOSIS — N2581 Secondary hyperparathyroidism of renal origin: Secondary | ICD-10-CM | POA: Diagnosis not present

## 2011-11-28 DIAGNOSIS — N2581 Secondary hyperparathyroidism of renal origin: Secondary | ICD-10-CM | POA: Diagnosis not present

## 2011-11-28 DIAGNOSIS — E119 Type 2 diabetes mellitus without complications: Secondary | ICD-10-CM | POA: Diagnosis not present

## 2011-11-28 DIAGNOSIS — D509 Iron deficiency anemia, unspecified: Secondary | ICD-10-CM | POA: Diagnosis not present

## 2011-11-28 DIAGNOSIS — N039 Chronic nephritic syndrome with unspecified morphologic changes: Secondary | ICD-10-CM | POA: Diagnosis not present

## 2011-11-28 DIAGNOSIS — N186 End stage renal disease: Secondary | ICD-10-CM | POA: Diagnosis not present

## 2011-12-01 DIAGNOSIS — N186 End stage renal disease: Secondary | ICD-10-CM | POA: Diagnosis not present

## 2011-12-01 DIAGNOSIS — N2581 Secondary hyperparathyroidism of renal origin: Secondary | ICD-10-CM | POA: Diagnosis not present

## 2011-12-01 DIAGNOSIS — N039 Chronic nephritic syndrome with unspecified morphologic changes: Secondary | ICD-10-CM | POA: Diagnosis not present

## 2011-12-01 DIAGNOSIS — E119 Type 2 diabetes mellitus without complications: Secondary | ICD-10-CM | POA: Diagnosis not present

## 2011-12-01 DIAGNOSIS — D509 Iron deficiency anemia, unspecified: Secondary | ICD-10-CM | POA: Diagnosis not present

## 2011-12-03 DIAGNOSIS — I4891 Unspecified atrial fibrillation: Secondary | ICD-10-CM | POA: Diagnosis not present

## 2011-12-03 DIAGNOSIS — D631 Anemia in chronic kidney disease: Secondary | ICD-10-CM | POA: Diagnosis not present

## 2011-12-03 DIAGNOSIS — N2581 Secondary hyperparathyroidism of renal origin: Secondary | ICD-10-CM | POA: Diagnosis not present

## 2011-12-03 DIAGNOSIS — N186 End stage renal disease: Secondary | ICD-10-CM | POA: Diagnosis not present

## 2011-12-03 DIAGNOSIS — E119 Type 2 diabetes mellitus without complications: Secondary | ICD-10-CM | POA: Diagnosis not present

## 2011-12-03 DIAGNOSIS — I6350 Cerebral infarction due to unspecified occlusion or stenosis of unspecified cerebral artery: Secondary | ICD-10-CM | POA: Diagnosis not present

## 2011-12-03 DIAGNOSIS — D509 Iron deficiency anemia, unspecified: Secondary | ICD-10-CM | POA: Diagnosis not present

## 2011-12-05 DIAGNOSIS — D509 Iron deficiency anemia, unspecified: Secondary | ICD-10-CM | POA: Diagnosis not present

## 2011-12-05 DIAGNOSIS — D631 Anemia in chronic kidney disease: Secondary | ICD-10-CM | POA: Diagnosis not present

## 2011-12-05 DIAGNOSIS — N2581 Secondary hyperparathyroidism of renal origin: Secondary | ICD-10-CM | POA: Diagnosis not present

## 2011-12-05 DIAGNOSIS — E119 Type 2 diabetes mellitus without complications: Secondary | ICD-10-CM | POA: Diagnosis not present

## 2011-12-05 DIAGNOSIS — N186 End stage renal disease: Secondary | ICD-10-CM | POA: Diagnosis not present

## 2011-12-24 DIAGNOSIS — I6350 Cerebral infarction due to unspecified occlusion or stenosis of unspecified cerebral artery: Secondary | ICD-10-CM | POA: Diagnosis not present

## 2011-12-24 DIAGNOSIS — I4891 Unspecified atrial fibrillation: Secondary | ICD-10-CM | POA: Diagnosis not present

## 2012-01-03 DIAGNOSIS — N186 End stage renal disease: Secondary | ICD-10-CM | POA: Diagnosis not present

## 2012-01-05 DIAGNOSIS — E1065 Type 1 diabetes mellitus with hyperglycemia: Secondary | ICD-10-CM | POA: Diagnosis not present

## 2012-01-05 DIAGNOSIS — N186 End stage renal disease: Secondary | ICD-10-CM | POA: Diagnosis not present

## 2012-01-05 DIAGNOSIS — D509 Iron deficiency anemia, unspecified: Secondary | ICD-10-CM | POA: Diagnosis not present

## 2012-01-05 DIAGNOSIS — N2581 Secondary hyperparathyroidism of renal origin: Secondary | ICD-10-CM | POA: Diagnosis not present

## 2012-01-05 DIAGNOSIS — D631 Anemia in chronic kidney disease: Secondary | ICD-10-CM | POA: Diagnosis not present

## 2012-01-05 DIAGNOSIS — E119 Type 2 diabetes mellitus without complications: Secondary | ICD-10-CM | POA: Diagnosis not present

## 2012-01-21 DIAGNOSIS — E1129 Type 2 diabetes mellitus with other diabetic kidney complication: Secondary | ICD-10-CM | POA: Diagnosis not present

## 2012-01-21 DIAGNOSIS — E1065 Type 1 diabetes mellitus with hyperglycemia: Secondary | ICD-10-CM | POA: Diagnosis not present

## 2012-01-21 DIAGNOSIS — I4891 Unspecified atrial fibrillation: Secondary | ICD-10-CM | POA: Diagnosis not present

## 2012-01-21 DIAGNOSIS — I6350 Cerebral infarction due to unspecified occlusion or stenosis of unspecified cerebral artery: Secondary | ICD-10-CM | POA: Diagnosis not present

## 2012-01-22 DIAGNOSIS — E785 Hyperlipidemia, unspecified: Secondary | ICD-10-CM | POA: Diagnosis not present

## 2012-02-02 DIAGNOSIS — N186 End stage renal disease: Secondary | ICD-10-CM | POA: Diagnosis not present

## 2012-02-04 DIAGNOSIS — N186 End stage renal disease: Secondary | ICD-10-CM | POA: Diagnosis not present

## 2012-02-04 DIAGNOSIS — E119 Type 2 diabetes mellitus without complications: Secondary | ICD-10-CM | POA: Diagnosis not present

## 2012-02-04 DIAGNOSIS — N2581 Secondary hyperparathyroidism of renal origin: Secondary | ICD-10-CM | POA: Diagnosis not present

## 2012-02-04 DIAGNOSIS — D631 Anemia in chronic kidney disease: Secondary | ICD-10-CM | POA: Diagnosis not present

## 2012-02-04 DIAGNOSIS — D509 Iron deficiency anemia, unspecified: Secondary | ICD-10-CM | POA: Diagnosis not present

## 2012-02-04 DIAGNOSIS — Z992 Dependence on renal dialysis: Secondary | ICD-10-CM | POA: Diagnosis not present

## 2012-02-09 DIAGNOSIS — Z992 Dependence on renal dialysis: Secondary | ICD-10-CM | POA: Diagnosis not present

## 2012-02-09 DIAGNOSIS — N186 End stage renal disease: Secondary | ICD-10-CM | POA: Diagnosis not present

## 2012-02-15 DIAGNOSIS — E785 Hyperlipidemia, unspecified: Secondary | ICD-10-CM | POA: Diagnosis not present

## 2012-02-15 DIAGNOSIS — E1065 Type 1 diabetes mellitus with hyperglycemia: Secondary | ICD-10-CM | POA: Diagnosis not present

## 2012-02-18 DIAGNOSIS — I6350 Cerebral infarction due to unspecified occlusion or stenosis of unspecified cerebral artery: Secondary | ICD-10-CM | POA: Diagnosis not present

## 2012-02-18 DIAGNOSIS — I4891 Unspecified atrial fibrillation: Secondary | ICD-10-CM | POA: Diagnosis not present

## 2012-03-04 DIAGNOSIS — N186 End stage renal disease: Secondary | ICD-10-CM | POA: Diagnosis not present

## 2012-03-05 DIAGNOSIS — D509 Iron deficiency anemia, unspecified: Secondary | ICD-10-CM | POA: Diagnosis not present

## 2012-03-05 DIAGNOSIS — E119 Type 2 diabetes mellitus without complications: Secondary | ICD-10-CM | POA: Diagnosis not present

## 2012-03-05 DIAGNOSIS — N186 End stage renal disease: Secondary | ICD-10-CM | POA: Diagnosis not present

## 2012-03-05 DIAGNOSIS — D631 Anemia in chronic kidney disease: Secondary | ICD-10-CM | POA: Diagnosis not present

## 2012-03-05 DIAGNOSIS — N2581 Secondary hyperparathyroidism of renal origin: Secondary | ICD-10-CM | POA: Diagnosis not present

## 2012-03-24 DIAGNOSIS — I6350 Cerebral infarction due to unspecified occlusion or stenosis of unspecified cerebral artery: Secondary | ICD-10-CM | POA: Diagnosis not present

## 2012-03-24 DIAGNOSIS — I4891 Unspecified atrial fibrillation: Secondary | ICD-10-CM | POA: Diagnosis not present

## 2012-04-03 DIAGNOSIS — N186 End stage renal disease: Secondary | ICD-10-CM | POA: Diagnosis not present

## 2012-04-05 DIAGNOSIS — E119 Type 2 diabetes mellitus without complications: Secondary | ICD-10-CM | POA: Diagnosis not present

## 2012-04-05 DIAGNOSIS — Z992 Dependence on renal dialysis: Secondary | ICD-10-CM | POA: Diagnosis not present

## 2012-04-05 DIAGNOSIS — D509 Iron deficiency anemia, unspecified: Secondary | ICD-10-CM | POA: Diagnosis not present

## 2012-04-05 DIAGNOSIS — N186 End stage renal disease: Secondary | ICD-10-CM | POA: Diagnosis not present

## 2012-04-05 DIAGNOSIS — N2581 Secondary hyperparathyroidism of renal origin: Secondary | ICD-10-CM | POA: Diagnosis not present

## 2012-04-05 DIAGNOSIS — D631 Anemia in chronic kidney disease: Secondary | ICD-10-CM | POA: Diagnosis not present

## 2012-04-14 ENCOUNTER — Other Ambulatory Visit (HOSPITAL_COMMUNITY): Payer: Self-pay | Admitting: Nephrology

## 2012-04-14 DIAGNOSIS — N186 End stage renal disease: Secondary | ICD-10-CM

## 2012-04-15 DIAGNOSIS — E785 Hyperlipidemia, unspecified: Secondary | ICD-10-CM | POA: Diagnosis not present

## 2012-04-18 ENCOUNTER — Other Ambulatory Visit (HOSPITAL_COMMUNITY): Payer: Self-pay | Admitting: Nephrology

## 2012-04-18 ENCOUNTER — Ambulatory Visit (HOSPITAL_COMMUNITY)
Admission: RE | Admit: 2012-04-18 | Discharge: 2012-04-18 | Disposition: A | Discharge status: Home / Self Care | Payer: Medicare Other | Source: Ambulatory Visit | Attending: Nephrology | Admitting: Nephrology

## 2012-04-18 DIAGNOSIS — G4733 Obstructive sleep apnea (adult) (pediatric): Secondary | ICD-10-CM | POA: Diagnosis not present

## 2012-04-18 DIAGNOSIS — T82898A Other specified complication of vascular prosthetic devices, implants and grafts, initial encounter: Secondary | ICD-10-CM | POA: Insufficient documentation

## 2012-04-18 DIAGNOSIS — E119 Type 2 diabetes mellitus without complications: Secondary | ICD-10-CM | POA: Insufficient documentation

## 2012-04-18 DIAGNOSIS — I120 Hypertensive chronic kidney disease with stage 5 chronic kidney disease or end stage renal disease: Secondary | ICD-10-CM | POA: Diagnosis not present

## 2012-04-18 DIAGNOSIS — N186 End stage renal disease: Secondary | ICD-10-CM | POA: Diagnosis not present

## 2012-04-18 DIAGNOSIS — Y849 Medical procedure, unspecified as the cause of abnormal reaction of the patient, or of later complication, without mention of misadventure at the time of the procedure: Secondary | ICD-10-CM | POA: Insufficient documentation

## 2012-04-18 MED ORDER — IOHEXOL 300 MG/ML  SOLN
100.0000 mL | Freq: Once | INTRAMUSCULAR | Status: AC | PRN
  Administered 2012-04-18: 60 mL via INTRAVENOUS

## 2012-04-18 NOTE — H&P (Signed)
Shane Hamilton is an 46 y.o. male.   Chief Complaint: esrd, decreased access flow LUE access  HPI: as above.   Past Medical History  Diagnosis Date  . Diabetes mellitus     diabetic ketoacidosis  . Morbid obesity   . OSA (obstructive sleep apnea)   . Septic shock   . Anemia   . Hypertension     No past surgical history on file.  No family history on file. Social History:  reports that he has never smoked. He does not have any smokeless tobacco history on file. He reports that he drinks alcohol. He reports that he does not use illicit drugs.  Allergies:  Allergies  Allergen Reactions  . Aspirin Other (See Comments)    Stomach problems     (Not in a hospital admission)  No results found for this or any previous visit (from the past 48 hour(s)). No results found.  Review of Systems  Constitutional: Negative for fever and chills.  Respiratory: Negative for cough and shortness of breath.   Cardiovascular: Negative for chest pain and palpitations.  Gastrointestinal: Negative for nausea, vomiting and abdominal pain.  Neurological: Negative for headaches.    There were no vitals taken for this visit. Physical Exam  Constitutional: He is oriented to person, place, and time.       Morbid obesity   Cardiovascular: Normal rate, regular rhythm and normal heart sounds.   No murmur heard. Respiratory: Effort normal. He has no wheezes. He exhibits no tenderness.  GI: Soft. Bowel sounds are normal. He exhibits no distension. There is no tenderness.  Neurological: He is alert and oriented to person, place, and time.  Psychiatric: He has a normal mood and affect.     Assessment/Plan Esrd, LUE ACCESS FOR shuntogram and intervention if needed.    Shane Naves T. 04/18/2012, 9:30 AM

## 2012-04-18 NOTE — Procedures (Signed)
Successful LUE AVF PTA OF VENOUS OUTFLOW  NO COMP  Stable Ready for use

## 2012-04-21 DIAGNOSIS — I6350 Cerebral infarction due to unspecified occlusion or stenosis of unspecified cerebral artery: Secondary | ICD-10-CM | POA: Diagnosis not present

## 2012-04-21 DIAGNOSIS — E1129 Type 2 diabetes mellitus with other diabetic kidney complication: Secondary | ICD-10-CM | POA: Diagnosis not present

## 2012-04-21 DIAGNOSIS — I4891 Unspecified atrial fibrillation: Secondary | ICD-10-CM | POA: Diagnosis not present

## 2012-04-29 ENCOUNTER — Other Ambulatory Visit (HOSPITAL_COMMUNITY): Payer: Self-pay | Admitting: Nephrology

## 2012-04-29 DIAGNOSIS — N186 End stage renal disease: Secondary | ICD-10-CM

## 2012-05-02 ENCOUNTER — Ambulatory Visit (HOSPITAL_COMMUNITY)
Admission: RE | Admit: 2012-05-02 | Discharge: 2012-05-02 | Disposition: A | Discharge status: Home / Self Care | Payer: Medicare Other | Source: Ambulatory Visit | Attending: Nephrology | Admitting: Nephrology

## 2012-05-02 DIAGNOSIS — T82898A Other specified complication of vascular prosthetic devices, implants and grafts, initial encounter: Secondary | ICD-10-CM | POA: Diagnosis not present

## 2012-05-02 DIAGNOSIS — N186 End stage renal disease: Secondary | ICD-10-CM

## 2012-05-02 DIAGNOSIS — Y849 Medical procedure, unspecified as the cause of abnormal reaction of the patient, or of later complication, without mention of misadventure at the time of the procedure: Secondary | ICD-10-CM | POA: Insufficient documentation

## 2012-05-02 MED ORDER — IOHEXOL 300 MG/ML  SOLN
100.0000 mL | Freq: Once | INTRAMUSCULAR | Status: AC | PRN
  Administered 2012-05-02: 30 mL via INTRAVENOUS

## 2012-05-04 DIAGNOSIS — N186 End stage renal disease: Secondary | ICD-10-CM | POA: Diagnosis not present

## 2012-05-05 DIAGNOSIS — E8779 Other fluid overload: Secondary | ICD-10-CM | POA: Diagnosis not present

## 2012-05-05 DIAGNOSIS — N186 End stage renal disease: Secondary | ICD-10-CM | POA: Diagnosis not present

## 2012-05-05 DIAGNOSIS — L02619 Cutaneous abscess of unspecified foot: Secondary | ICD-10-CM | POA: Diagnosis not present

## 2012-05-05 DIAGNOSIS — E119 Type 2 diabetes mellitus without complications: Secondary | ICD-10-CM | POA: Diagnosis not present

## 2012-05-05 DIAGNOSIS — D631 Anemia in chronic kidney disease: Secondary | ICD-10-CM | POA: Diagnosis not present

## 2012-05-05 DIAGNOSIS — D509 Iron deficiency anemia, unspecified: Secondary | ICD-10-CM | POA: Diagnosis not present

## 2012-05-05 DIAGNOSIS — M109 Gout, unspecified: Secondary | ICD-10-CM | POA: Diagnosis not present

## 2012-05-05 DIAGNOSIS — N2581 Secondary hyperparathyroidism of renal origin: Secondary | ICD-10-CM | POA: Diagnosis not present

## 2012-05-14 DIAGNOSIS — N186 End stage renal disease: Secondary | ICD-10-CM | POA: Diagnosis not present

## 2012-05-17 DIAGNOSIS — N186 End stage renal disease: Secondary | ICD-10-CM | POA: Diagnosis not present

## 2012-05-26 DIAGNOSIS — I6350 Cerebral infarction due to unspecified occlusion or stenosis of unspecified cerebral artery: Secondary | ICD-10-CM | POA: Diagnosis not present

## 2012-05-26 DIAGNOSIS — I4891 Unspecified atrial fibrillation: Secondary | ICD-10-CM | POA: Diagnosis not present

## 2012-05-28 DIAGNOSIS — Z7901 Long term (current) use of anticoagulants: Secondary | ICD-10-CM | POA: Diagnosis not present

## 2012-05-28 DIAGNOSIS — M109 Gout, unspecified: Secondary | ICD-10-CM | POA: Diagnosis not present

## 2012-06-04 DIAGNOSIS — N186 End stage renal disease: Secondary | ICD-10-CM | POA: Diagnosis not present

## 2012-06-06 DIAGNOSIS — E119 Type 2 diabetes mellitus without complications: Secondary | ICD-10-CM | POA: Diagnosis not present

## 2012-06-06 DIAGNOSIS — N2581 Secondary hyperparathyroidism of renal origin: Secondary | ICD-10-CM | POA: Diagnosis not present

## 2012-06-06 DIAGNOSIS — D509 Iron deficiency anemia, unspecified: Secondary | ICD-10-CM | POA: Diagnosis not present

## 2012-06-06 DIAGNOSIS — L02619 Cutaneous abscess of unspecified foot: Secondary | ICD-10-CM | POA: Diagnosis not present

## 2012-06-06 DIAGNOSIS — E8779 Other fluid overload: Secondary | ICD-10-CM | POA: Diagnosis not present

## 2012-06-06 DIAGNOSIS — D631 Anemia in chronic kidney disease: Secondary | ICD-10-CM | POA: Diagnosis not present

## 2012-06-06 DIAGNOSIS — N186 End stage renal disease: Secondary | ICD-10-CM | POA: Diagnosis not present

## 2012-06-07 DIAGNOSIS — I82729 Chronic embolism and thrombosis of deep veins of unspecified upper extremity: Secondary | ICD-10-CM | POA: Diagnosis not present

## 2012-06-13 DIAGNOSIS — I82729 Chronic embolism and thrombosis of deep veins of unspecified upper extremity: Secondary | ICD-10-CM | POA: Diagnosis not present

## 2012-06-23 DIAGNOSIS — I6350 Cerebral infarction due to unspecified occlusion or stenosis of unspecified cerebral artery: Secondary | ICD-10-CM | POA: Diagnosis not present

## 2012-06-23 DIAGNOSIS — I4891 Unspecified atrial fibrillation: Secondary | ICD-10-CM | POA: Diagnosis not present

## 2012-07-04 DIAGNOSIS — N186 End stage renal disease: Secondary | ICD-10-CM | POA: Diagnosis not present

## 2012-07-05 DIAGNOSIS — E8779 Other fluid overload: Secondary | ICD-10-CM | POA: Diagnosis not present

## 2012-07-05 DIAGNOSIS — N186 End stage renal disease: Secondary | ICD-10-CM | POA: Diagnosis not present

## 2012-07-05 DIAGNOSIS — D631 Anemia in chronic kidney disease: Secondary | ICD-10-CM | POA: Diagnosis not present

## 2012-07-05 DIAGNOSIS — N2581 Secondary hyperparathyroidism of renal origin: Secondary | ICD-10-CM | POA: Diagnosis not present

## 2012-07-05 DIAGNOSIS — M109 Gout, unspecified: Secondary | ICD-10-CM | POA: Diagnosis not present

## 2012-07-05 DIAGNOSIS — E119 Type 2 diabetes mellitus without complications: Secondary | ICD-10-CM | POA: Diagnosis not present

## 2012-07-05 DIAGNOSIS — D509 Iron deficiency anemia, unspecified: Secondary | ICD-10-CM | POA: Diagnosis not present

## 2012-07-05 DIAGNOSIS — I2699 Other pulmonary embolism without acute cor pulmonale: Secondary | ICD-10-CM | POA: Diagnosis not present

## 2012-07-07 DIAGNOSIS — L509 Urticaria, unspecified: Secondary | ICD-10-CM | POA: Diagnosis not present

## 2012-07-20 DIAGNOSIS — Z794 Long term (current) use of insulin: Secondary | ICD-10-CM | POA: Diagnosis not present

## 2012-07-20 DIAGNOSIS — E669 Obesity, unspecified: Secondary | ICD-10-CM | POA: Diagnosis not present

## 2012-07-20 DIAGNOSIS — N186 End stage renal disease: Secondary | ICD-10-CM | POA: Diagnosis not present

## 2012-07-20 DIAGNOSIS — E785 Hyperlipidemia, unspecified: Secondary | ICD-10-CM | POA: Diagnosis not present

## 2012-07-21 DIAGNOSIS — E1129 Type 2 diabetes mellitus with other diabetic kidney complication: Secondary | ICD-10-CM | POA: Diagnosis not present

## 2012-07-21 DIAGNOSIS — I4891 Unspecified atrial fibrillation: Secondary | ICD-10-CM | POA: Diagnosis not present

## 2012-07-21 DIAGNOSIS — I6350 Cerebral infarction due to unspecified occlusion or stenosis of unspecified cerebral artery: Secondary | ICD-10-CM | POA: Diagnosis not present

## 2012-07-23 DIAGNOSIS — IMO0002 Reserved for concepts with insufficient information to code with codable children: Secondary | ICD-10-CM | POA: Diagnosis not present

## 2012-07-29 DIAGNOSIS — M79609 Pain in unspecified limb: Secondary | ICD-10-CM | POA: Diagnosis not present

## 2012-07-29 DIAGNOSIS — E669 Obesity, unspecified: Secondary | ICD-10-CM | POA: Diagnosis not present

## 2012-07-29 DIAGNOSIS — T148XXA Other injury of unspecified body region, initial encounter: Secondary | ICD-10-CM | POA: Diagnosis not present

## 2012-08-01 ENCOUNTER — Other Ambulatory Visit (HOSPITAL_COMMUNITY): Payer: Self-pay | Admitting: Nephrology

## 2012-08-01 DIAGNOSIS — N186 End stage renal disease: Secondary | ICD-10-CM

## 2012-08-04 DIAGNOSIS — N186 End stage renal disease: Secondary | ICD-10-CM | POA: Diagnosis not present

## 2012-08-05 ENCOUNTER — Ambulatory Visit (HOSPITAL_COMMUNITY)
Admission: RE | Admit: 2012-08-05 | Discharge: 2012-08-05 | Disposition: A | Discharge status: Home / Self Care | Payer: Medicare Other | Source: Ambulatory Visit | Attending: Nephrology | Admitting: Nephrology

## 2012-08-05 DIAGNOSIS — N186 End stage renal disease: Secondary | ICD-10-CM | POA: Diagnosis not present

## 2012-08-05 DIAGNOSIS — T82898A Other specified complication of vascular prosthetic devices, implants and grafts, initial encounter: Secondary | ICD-10-CM | POA: Diagnosis not present

## 2012-08-05 DIAGNOSIS — Y849 Medical procedure, unspecified as the cause of abnormal reaction of the patient, or of later complication, without mention of misadventure at the time of the procedure: Secondary | ICD-10-CM | POA: Insufficient documentation

## 2012-08-05 MED ORDER — IOHEXOL 300 MG/ML  SOLN
50.0000 mL | Freq: Once | INTRAMUSCULAR | Status: AC | PRN
  Administered 2012-08-05: 32 mL via INTRAVENOUS

## 2012-08-05 NOTE — Procedures (Signed)
Successful LUE AVFistulogram No comp Stable Full report in pacs

## 2012-08-06 DIAGNOSIS — E119 Type 2 diabetes mellitus without complications: Secondary | ICD-10-CM | POA: Diagnosis not present

## 2012-08-06 DIAGNOSIS — E8779 Other fluid overload: Secondary | ICD-10-CM | POA: Diagnosis not present

## 2012-08-06 DIAGNOSIS — M109 Gout, unspecified: Secondary | ICD-10-CM | POA: Diagnosis not present

## 2012-08-06 DIAGNOSIS — N2581 Secondary hyperparathyroidism of renal origin: Secondary | ICD-10-CM | POA: Diagnosis not present

## 2012-08-06 DIAGNOSIS — D631 Anemia in chronic kidney disease: Secondary | ICD-10-CM | POA: Diagnosis not present

## 2012-08-06 DIAGNOSIS — D509 Iron deficiency anemia, unspecified: Secondary | ICD-10-CM | POA: Diagnosis not present

## 2012-08-06 DIAGNOSIS — N186 End stage renal disease: Secondary | ICD-10-CM | POA: Diagnosis not present

## 2012-08-11 DIAGNOSIS — I2699 Other pulmonary embolism without acute cor pulmonale: Secondary | ICD-10-CM | POA: Diagnosis not present

## 2012-08-18 DIAGNOSIS — I2699 Other pulmonary embolism without acute cor pulmonale: Secondary | ICD-10-CM | POA: Diagnosis not present

## 2012-08-23 ENCOUNTER — Other Ambulatory Visit (HOSPITAL_COMMUNITY): Payer: Self-pay | Admitting: Nephrology

## 2012-08-23 DIAGNOSIS — Z992 Dependence on renal dialysis: Secondary | ICD-10-CM

## 2012-08-24 ENCOUNTER — Other Ambulatory Visit (HOSPITAL_COMMUNITY): Payer: Self-pay | Admitting: Nephrology

## 2012-08-24 ENCOUNTER — Ambulatory Visit (HOSPITAL_COMMUNITY)
Admission: RE | Admit: 2012-08-24 | Discharge: 2012-08-24 | Disposition: A | Discharge status: Home / Self Care | Payer: Medicare Other | Source: Ambulatory Visit | Attending: Nephrology | Admitting: Nephrology

## 2012-08-24 DIAGNOSIS — T82598A Other mechanical complication of other cardiac and vascular devices and implants, initial encounter: Secondary | ICD-10-CM | POA: Diagnosis not present

## 2012-08-24 DIAGNOSIS — N186 End stage renal disease: Secondary | ICD-10-CM

## 2012-08-24 DIAGNOSIS — Z79899 Other long term (current) drug therapy: Secondary | ICD-10-CM | POA: Diagnosis not present

## 2012-08-24 DIAGNOSIS — Z992 Dependence on renal dialysis: Secondary | ICD-10-CM | POA: Insufficient documentation

## 2012-08-24 DIAGNOSIS — I871 Compression of vein: Secondary | ICD-10-CM | POA: Insufficient documentation

## 2012-08-24 DIAGNOSIS — I120 Hypertensive chronic kidney disease with stage 5 chronic kidney disease or end stage renal disease: Secondary | ICD-10-CM | POA: Diagnosis not present

## 2012-08-24 DIAGNOSIS — Y832 Surgical operation with anastomosis, bypass or graft as the cause of abnormal reaction of the patient, or of later complication, without mention of misadventure at the time of the procedure: Secondary | ICD-10-CM | POA: Insufficient documentation

## 2012-08-24 DIAGNOSIS — T82898A Other specified complication of vascular prosthetic devices, implants and grafts, initial encounter: Secondary | ICD-10-CM | POA: Diagnosis not present

## 2012-08-24 DIAGNOSIS — E119 Type 2 diabetes mellitus without complications: Secondary | ICD-10-CM | POA: Diagnosis not present

## 2012-08-24 LAB — POCT I-STAT 4, (NA,K, GLUC, HGB,HCT): Sodium: 138 mEq/L (ref 135–145)

## 2012-08-24 MED ORDER — IOHEXOL 300 MG/ML  SOLN: 100.0000 mL | Freq: Once | INTRAMUSCULAR | Status: AC | PRN

## 2012-08-24 NOTE — H&P (Signed)
BERRY GODSEY is an 46 y.o. male.   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 Diabetes mellitus     diabetic ketoacidosis  . Morbid obesity   . OSA (obstructive sleep apnea)   . Septic shock   . Anemia   . Hypertension     No past surgical history on file.  No family history on file. Social History:  reports that he has never smoked. He does not have any smokeless tobacco history on file. He reports that he drinks alcohol. He reports that he does not use illicit drugs.  Allergies:  Allergies  Allergen Reactions  . Aspirin Other (See Comments)    Stomach problems    Current Outpatient Prescriptions on File Prior to Encounter  Medication Sig Dispense Refill  . atorvastatin (LIPITOR) 10 MG tablet Take 10 mg by mouth daily.      . calcium acetate (PHOSLO) 667 MG capsule Take 2,001 mg by mouth 3 (three) times daily with meals.       . clonazePAM (KLONOPIN) 1 MG tablet Take 1 mg by mouth 2 (two) times daily as needed. For anxiety      . folic acid-vitamin b complex-vitamin c-selenium-zinc (DIALYVITE) 3 MG TABS Take 1 tablet by mouth daily.        . hydrocortisone cream 1 % Apply 1 application topically daily as needed. Apply to affected area for welts.      . insulin aspart (NOVOLOG) 100 UNIT/ML injection Inject 5 Units into the skin 3 (three) times daily before meals.      . insulin aspart protamine-insulin aspart (NOVOLOG 70/30) (70-30) 100 UNIT/ML injection Inject 18 Units into the skin 2 (two) times daily with a meal.      . insulin glargine (LANTUS) 100 UNIT/ML injection Inject 40 Units into the skin at bedtime.      Marland Kitchen lisinopril (PRINIVIL,ZESTRIL) 10 MG tablet Take 10 mg by mouth daily.      Marland Kitchen omeprazole (PRILOSEC) 20 MG capsule Take 20 mg by mouth daily.        Marland Kitchen tobramycin-dexamethasone (TOBRADEX) ophthalmic solution Place 1 drop into the right eye 2 (two)  times daily.      Marland Kitchen warfarin (COUMADIN) 10 MG tablet Take 10 mg by mouth every Monday, Wednesday, and Friday.      . warfarin (COUMADIN) 7.5 MG tablet Take 7.5 mg by mouth every Tuesday, Thursday, Saturday, and Sunday.        Review of Systems  Constitutional: Negative.   Respiratory: Negative.   Cardiovascular: Negative.     Physical Exam  Cardiovascular: Normal rate, regular rhythm and normal heart sounds.   Respiratory: He has wheezes.  Morbid obesity. Left arm fistula is patent by ultrasound  Fistulogram:  Patent fistulogram with areas of stenosis.  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  Plan for balloon angioplasty of venous stenoses.  Discussed with patient and informed consent obtained.   Nathanial Arrighi RYAN 08/24/2012, 12:31 PM

## 2012-08-24 NOTE — Procedures (Signed)
Successful balloon angioplasty of two areas of venous stenosis in the left arm fistula.  No immediate complication. See dictated report.

## 2012-08-25 ENCOUNTER — Telehealth (HOSPITAL_COMMUNITY): Payer: Self-pay | Admitting: *Deleted

## 2012-08-25 DIAGNOSIS — I4891 Unspecified atrial fibrillation: Secondary | ICD-10-CM | POA: Diagnosis not present

## 2012-09-03 DIAGNOSIS — N186 End stage renal disease: Secondary | ICD-10-CM | POA: Diagnosis not present

## 2012-09-05 DIAGNOSIS — K7689 Other specified diseases of liver: Secondary | ICD-10-CM | POA: Diagnosis not present

## 2012-09-05 DIAGNOSIS — D631 Anemia in chronic kidney disease: Secondary | ICD-10-CM | POA: Diagnosis not present

## 2012-09-05 DIAGNOSIS — E119 Type 2 diabetes mellitus without complications: Secondary | ICD-10-CM | POA: Diagnosis not present

## 2012-09-05 DIAGNOSIS — N2581 Secondary hyperparathyroidism of renal origin: Secondary | ICD-10-CM | POA: Diagnosis not present

## 2012-09-05 DIAGNOSIS — E878 Other disorders of electrolyte and fluid balance, not elsewhere classified: Secondary | ICD-10-CM | POA: Diagnosis not present

## 2012-09-05 DIAGNOSIS — D539 Nutritional anemia, unspecified: Secondary | ICD-10-CM | POA: Diagnosis not present

## 2012-09-05 DIAGNOSIS — N259 Disorder resulting from impaired renal tubular function, unspecified: Secondary | ICD-10-CM | POA: Diagnosis not present

## 2012-09-05 DIAGNOSIS — D509 Iron deficiency anemia, unspecified: Secondary | ICD-10-CM | POA: Diagnosis not present

## 2012-09-05 DIAGNOSIS — E1129 Type 2 diabetes mellitus with other diabetic kidney complication: Secondary | ICD-10-CM | POA: Diagnosis not present

## 2012-09-05 DIAGNOSIS — N186 End stage renal disease: Secondary | ICD-10-CM | POA: Diagnosis not present

## 2012-09-10 DIAGNOSIS — L02619 Cutaneous abscess of unspecified foot: Secondary | ICD-10-CM | POA: Diagnosis not present

## 2012-09-11 DIAGNOSIS — M79609 Pain in unspecified limb: Secondary | ICD-10-CM | POA: Diagnosis not present

## 2012-09-13 DIAGNOSIS — I825Z9 Chronic embolism and thrombosis of unspecified deep veins of unspecified distal lower extremity: Secondary | ICD-10-CM | POA: Diagnosis not present

## 2012-09-15 ENCOUNTER — Ambulatory Visit (HOSPITAL_COMMUNITY)
Admission: RE | Admit: 2012-09-15 | Discharge: 2012-09-15 | Disposition: A | Discharge status: Home / Self Care | Payer: Medicare Other | Source: Ambulatory Visit | Attending: Nephrology | Admitting: Nephrology

## 2012-09-15 ENCOUNTER — Encounter (HOSPITAL_COMMUNITY): Payer: Self-pay

## 2012-09-15 ENCOUNTER — Other Ambulatory Visit (HOSPITAL_COMMUNITY): Payer: Self-pay | Admitting: Nephrology

## 2012-09-15 DIAGNOSIS — Z992 Dependence on renal dialysis: Secondary | ICD-10-CM | POA: Insufficient documentation

## 2012-09-15 DIAGNOSIS — E119 Type 2 diabetes mellitus without complications: Secondary | ICD-10-CM | POA: Diagnosis not present

## 2012-09-15 DIAGNOSIS — N186 End stage renal disease: Secondary | ICD-10-CM | POA: Insufficient documentation

## 2012-09-15 DIAGNOSIS — I120 Hypertensive chronic kidney disease with stage 5 chronic kidney disease or end stage renal disease: Secondary | ICD-10-CM | POA: Diagnosis not present

## 2012-09-15 DIAGNOSIS — I871 Compression of vein: Secondary | ICD-10-CM | POA: Diagnosis not present

## 2012-09-15 DIAGNOSIS — T82898A Other specified complication of vascular prosthetic devices, implants and grafts, initial encounter: Secondary | ICD-10-CM | POA: Insufficient documentation

## 2012-09-15 DIAGNOSIS — Y832 Surgical operation with anastomosis, bypass or graft as the cause of abnormal reaction of the patient, or of later complication, without mention of misadventure at the time of the procedure: Secondary | ICD-10-CM | POA: Insufficient documentation

## 2012-09-15 LAB — GLUCOSE, CAPILLARY: Glucose-Capillary: 109 mg/dL — ABNORMAL HIGH (ref 70–99)

## 2012-09-15 MED ORDER — ALTEPLASE 100 MG IV SOLR
4.0000 mg | Freq: Once | INTRAVENOUS | Status: DC
  Filled 2012-09-15: qty 4

## 2012-09-15 MED ORDER — HEPARIN SODIUM (PORCINE) 1000 UNIT/ML IJ SOLN
INTRAMUSCULAR | Status: AC
  Filled 2012-09-15: qty 1

## 2012-09-15 MED ORDER — HEPARIN SODIUM (PORCINE) 1000 UNIT/ML IJ SOLN
INTRAMUSCULAR | Status: DC | PRN
  Administered 2012-09-15: 3000 [IU] via INTRAVENOUS

## 2012-09-15 MED ORDER — IOHEXOL 300 MG/ML  SOLN
100.0000 mL | Freq: Once | INTRAMUSCULAR | Status: AC | PRN
  Administered 2012-09-15: 60 mL via INTRAVENOUS

## 2012-09-15 MED ORDER — MIDAZOLAM HCL 2 MG/2ML IJ SOLN
INTRAMUSCULAR | Status: AC
  Filled 2012-09-15: qty 4

## 2012-09-15 MED ORDER — FENTANYL CITRATE 0.05 MG/ML IJ SOLN
INTRAMUSCULAR | Status: AC
  Filled 2012-09-15: qty 4

## 2012-09-15 NOTE — ED Notes (Addendum)
Pt somnolent, with frequent periods of apnea and deep snoring, drops O2 sat to  High 80s then to 94-99

## 2012-09-15 NOTE — Procedures (Signed)
Technically successful fistulagram with angioplasty and stent placement of a moderate length moderate to severe venous stenosis.  No immediate post procedural complications.

## 2012-09-15 NOTE — H&P (Signed)
Shane Hamilton is an 46 y.o. male.   Chief Complaint: left arm dialysis fistula clotted Last use 12/10: good flow 12/12: clotted Last intervention: PTA 08/24/12 Scheduled for thrombolysis and possible angioplasty/stent placement Possible dialysis catheter placement if needed HPI: ESRD; DM; sleep apnea; HTN  Past Medical History  Diagnosis Date  . Diabetes mellitus     diabetic ketoacidosis  . Morbid obesity   . OSA (obstructive sleep apnea)   . Septic shock(785.52)   . Anemia   . Hypertension     No past surgical history on file.  No family history on file. Social History:  reports that he has never smoked. He does not have any smokeless tobacco history on file. He reports that he drinks alcohol. He reports that he does not use illicit drugs.  Allergies:  Allergies  Allergen Reactions  . Aspirin Other (See Comments)    Stomach problems     (Not in a hospital admission)  Results for orders placed during the hospital encounter of 09/15/12 (from the past 48 hour(s))  POTASSIUM     Status: Normal   Collection Time   09/15/12  9:50 AM      Component Value Range Comment   Potassium 3.8  3.5 - 5.1 mEq/L   GLUCOSE, CAPILLARY     Status: Abnormal   Collection Time   09/15/12 10:05 AM      Component Value Range Comment   Glucose-Capillary 109 (*) 70 - 99 mg/dL    No results found.  Review of Systems  Constitutional: Negative for fever.  Respiratory: Positive for shortness of breath.   Cardiovascular: Negative for chest pain.  Gastrointestinal: Negative for nausea, vomiting and abdominal pain.  Musculoskeletal: Negative for back pain.  Neurological: Negative for headaches.    Blood pressure 184/126, pulse 104, temperature 98.5 F (36.9 C), temperature source Oral, SpO2 99.00%. Physical Exam  Constitutional: He is oriented to person, place, and time. He appears well-nourished.  Cardiovascular: Normal rate, regular rhythm and normal heart sounds.   Respiratory:  Effort normal. He has no wheezes.  GI: Soft. Bowel sounds are normal.       Morbid obese  Musculoskeletal: Normal range of motion.       Left arm fistula clotted  Neurological: He is alert and oriented to person, place, and time.  Psychiatric: He has a normal mood and affect. His behavior is normal. Judgment and thought content normal.     Assessment/Plan ESRD Clotted left arm dialysis fistula Scheduled now for thrombolysis and poss pta/stent. Poss dial catheter if needed Pt aware of procedure benefits and risks and agreeable to proceed Consent signed and in chart  Shane Hamilton A 09/15/2012, 10:53 AM

## 2012-09-15 NOTE — ED Notes (Signed)
No sedation used d/t pt OSA, will continue to monitor pt as needed

## 2012-09-22 DIAGNOSIS — I4891 Unspecified atrial fibrillation: Secondary | ICD-10-CM | POA: Diagnosis not present

## 2012-09-24 DIAGNOSIS — N186 End stage renal disease: Secondary | ICD-10-CM | POA: Diagnosis not present

## 2012-09-26 DIAGNOSIS — N186 End stage renal disease: Secondary | ICD-10-CM | POA: Diagnosis not present

## 2012-09-29 DIAGNOSIS — N186 End stage renal disease: Secondary | ICD-10-CM | POA: Diagnosis not present

## 2012-10-04 DIAGNOSIS — N186 End stage renal disease: Secondary | ICD-10-CM | POA: Diagnosis not present

## 2012-10-06 DIAGNOSIS — E119 Type 2 diabetes mellitus without complications: Secondary | ICD-10-CM | POA: Diagnosis not present

## 2012-10-06 DIAGNOSIS — D631 Anemia in chronic kidney disease: Secondary | ICD-10-CM | POA: Diagnosis not present

## 2012-10-06 DIAGNOSIS — N186 End stage renal disease: Secondary | ICD-10-CM | POA: Diagnosis not present

## 2012-10-06 DIAGNOSIS — D509 Iron deficiency anemia, unspecified: Secondary | ICD-10-CM | POA: Diagnosis not present

## 2012-10-06 DIAGNOSIS — N2581 Secondary hyperparathyroidism of renal origin: Secondary | ICD-10-CM | POA: Diagnosis not present

## 2012-10-27 DIAGNOSIS — E1129 Type 2 diabetes mellitus with other diabetic kidney complication: Secondary | ICD-10-CM | POA: Diagnosis not present

## 2012-10-27 DIAGNOSIS — I4891 Unspecified atrial fibrillation: Secondary | ICD-10-CM | POA: Diagnosis not present

## 2012-11-04 DIAGNOSIS — N186 End stage renal disease: Secondary | ICD-10-CM | POA: Diagnosis not present

## 2012-11-05 DIAGNOSIS — D631 Anemia in chronic kidney disease: Secondary | ICD-10-CM | POA: Diagnosis not present

## 2012-11-05 DIAGNOSIS — N186 End stage renal disease: Secondary | ICD-10-CM | POA: Diagnosis not present

## 2012-11-05 DIAGNOSIS — N2581 Secondary hyperparathyroidism of renal origin: Secondary | ICD-10-CM | POA: Diagnosis not present

## 2012-11-05 DIAGNOSIS — D509 Iron deficiency anemia, unspecified: Secondary | ICD-10-CM | POA: Diagnosis not present

## 2012-11-05 DIAGNOSIS — E119 Type 2 diabetes mellitus without complications: Secondary | ICD-10-CM | POA: Diagnosis not present

## 2012-11-05 DIAGNOSIS — I509 Heart failure, unspecified: Secondary | ICD-10-CM | POA: Diagnosis not present

## 2012-11-05 DIAGNOSIS — Z119 Encounter for screening for infectious and parasitic diseases, unspecified: Secondary | ICD-10-CM | POA: Diagnosis not present

## 2012-12-03 DIAGNOSIS — D631 Anemia in chronic kidney disease: Secondary | ICD-10-CM | POA: Diagnosis not present

## 2012-12-03 DIAGNOSIS — N2581 Secondary hyperparathyroidism of renal origin: Secondary | ICD-10-CM | POA: Diagnosis not present

## 2012-12-03 DIAGNOSIS — T827XXA Infection and inflammatory reaction due to other cardiac and vascular devices, implants and grafts, initial encounter: Secondary | ICD-10-CM | POA: Diagnosis not present

## 2012-12-03 DIAGNOSIS — I509 Heart failure, unspecified: Secondary | ICD-10-CM | POA: Diagnosis not present

## 2012-12-03 DIAGNOSIS — E1065 Type 1 diabetes mellitus with hyperglycemia: Secondary | ICD-10-CM | POA: Diagnosis not present

## 2012-12-03 DIAGNOSIS — N186 End stage renal disease: Secondary | ICD-10-CM | POA: Diagnosis not present

## 2012-12-03 DIAGNOSIS — D509 Iron deficiency anemia, unspecified: Secondary | ICD-10-CM | POA: Diagnosis not present

## 2012-12-03 DIAGNOSIS — E119 Type 2 diabetes mellitus without complications: Secondary | ICD-10-CM | POA: Diagnosis not present

## 2013-01-02 DIAGNOSIS — N186 End stage renal disease: Secondary | ICD-10-CM | POA: Diagnosis not present

## 2013-01-03 DIAGNOSIS — E119 Type 2 diabetes mellitus without complications: Secondary | ICD-10-CM | POA: Diagnosis not present

## 2013-01-03 DIAGNOSIS — N2581 Secondary hyperparathyroidism of renal origin: Secondary | ICD-10-CM | POA: Diagnosis not present

## 2013-01-03 DIAGNOSIS — D509 Iron deficiency anemia, unspecified: Secondary | ICD-10-CM | POA: Diagnosis not present

## 2013-01-03 DIAGNOSIS — I509 Heart failure, unspecified: Secondary | ICD-10-CM | POA: Diagnosis not present

## 2013-01-03 DIAGNOSIS — N186 End stage renal disease: Secondary | ICD-10-CM | POA: Diagnosis not present

## 2013-01-03 DIAGNOSIS — D631 Anemia in chronic kidney disease: Secondary | ICD-10-CM | POA: Diagnosis not present

## 2013-01-12 DIAGNOSIS — I4891 Unspecified atrial fibrillation: Secondary | ICD-10-CM | POA: Diagnosis not present

## 2013-01-12 DIAGNOSIS — E1129 Type 2 diabetes mellitus with other diabetic kidney complication: Secondary | ICD-10-CM | POA: Diagnosis not present

## 2013-02-01 DIAGNOSIS — N186 End stage renal disease: Secondary | ICD-10-CM | POA: Diagnosis not present

## 2013-02-02 DIAGNOSIS — I2699 Other pulmonary embolism without acute cor pulmonale: Secondary | ICD-10-CM | POA: Diagnosis not present

## 2013-02-02 DIAGNOSIS — E119 Type 2 diabetes mellitus without complications: Secondary | ICD-10-CM | POA: Diagnosis not present

## 2013-02-02 DIAGNOSIS — N2581 Secondary hyperparathyroidism of renal origin: Secondary | ICD-10-CM | POA: Diagnosis not present

## 2013-02-02 DIAGNOSIS — N186 End stage renal disease: Secondary | ICD-10-CM | POA: Diagnosis not present

## 2013-02-02 DIAGNOSIS — I509 Heart failure, unspecified: Secondary | ICD-10-CM | POA: Diagnosis not present

## 2013-02-02 DIAGNOSIS — D509 Iron deficiency anemia, unspecified: Secondary | ICD-10-CM | POA: Diagnosis not present

## 2013-02-02 DIAGNOSIS — D631 Anemia in chronic kidney disease: Secondary | ICD-10-CM | POA: Diagnosis not present

## 2013-02-16 DIAGNOSIS — I4891 Unspecified atrial fibrillation: Secondary | ICD-10-CM | POA: Diagnosis not present

## 2013-03-02 DIAGNOSIS — I4891 Unspecified atrial fibrillation: Secondary | ICD-10-CM | POA: Diagnosis not present

## 2013-03-04 DIAGNOSIS — N186 End stage renal disease: Secondary | ICD-10-CM | POA: Diagnosis not present

## 2013-03-07 DIAGNOSIS — D509 Iron deficiency anemia, unspecified: Secondary | ICD-10-CM | POA: Diagnosis not present

## 2013-03-07 DIAGNOSIS — E119 Type 2 diabetes mellitus without complications: Secondary | ICD-10-CM | POA: Diagnosis not present

## 2013-03-07 DIAGNOSIS — D631 Anemia in chronic kidney disease: Secondary | ICD-10-CM | POA: Diagnosis not present

## 2013-03-07 DIAGNOSIS — N2581 Secondary hyperparathyroidism of renal origin: Secondary | ICD-10-CM | POA: Diagnosis not present

## 2013-03-07 DIAGNOSIS — N186 End stage renal disease: Secondary | ICD-10-CM | POA: Diagnosis not present

## 2013-03-10 DIAGNOSIS — N186 End stage renal disease: Secondary | ICD-10-CM | POA: Diagnosis not present

## 2013-03-14 DIAGNOSIS — R300 Dysuria: Secondary | ICD-10-CM | POA: Diagnosis not present

## 2013-03-16 DIAGNOSIS — I4891 Unspecified atrial fibrillation: Secondary | ICD-10-CM | POA: Diagnosis not present

## 2013-03-18 DIAGNOSIS — I2699 Other pulmonary embolism without acute cor pulmonale: Secondary | ICD-10-CM | POA: Diagnosis not present

## 2013-03-23 DIAGNOSIS — I2699 Other pulmonary embolism without acute cor pulmonale: Secondary | ICD-10-CM | POA: Diagnosis not present

## 2013-03-30 DIAGNOSIS — I2699 Other pulmonary embolism without acute cor pulmonale: Secondary | ICD-10-CM | POA: Diagnosis not present

## 2013-04-03 DIAGNOSIS — N186 End stage renal disease: Secondary | ICD-10-CM | POA: Diagnosis not present

## 2013-04-04 DIAGNOSIS — D509 Iron deficiency anemia, unspecified: Secondary | ICD-10-CM | POA: Diagnosis not present

## 2013-04-04 DIAGNOSIS — E119 Type 2 diabetes mellitus without complications: Secondary | ICD-10-CM | POA: Diagnosis not present

## 2013-04-04 DIAGNOSIS — N186 End stage renal disease: Secondary | ICD-10-CM | POA: Diagnosis not present

## 2013-04-04 DIAGNOSIS — N2581 Secondary hyperparathyroidism of renal origin: Secondary | ICD-10-CM | POA: Diagnosis not present

## 2013-04-04 DIAGNOSIS — D631 Anemia in chronic kidney disease: Secondary | ICD-10-CM | POA: Diagnosis not present

## 2013-04-13 DIAGNOSIS — I4891 Unspecified atrial fibrillation: Secondary | ICD-10-CM | POA: Diagnosis not present

## 2013-04-13 DIAGNOSIS — E1129 Type 2 diabetes mellitus with other diabetic kidney complication: Secondary | ICD-10-CM | POA: Diagnosis not present

## 2013-05-04 DIAGNOSIS — N186 End stage renal disease: Secondary | ICD-10-CM | POA: Diagnosis not present

## 2013-05-05 DIAGNOSIS — E1129 Type 2 diabetes mellitus with other diabetic kidney complication: Secondary | ICD-10-CM | POA: Diagnosis not present

## 2013-05-05 DIAGNOSIS — N186 End stage renal disease: Secondary | ICD-10-CM | POA: Diagnosis not present

## 2013-05-06 DIAGNOSIS — D631 Anemia in chronic kidney disease: Secondary | ICD-10-CM | POA: Diagnosis not present

## 2013-05-06 DIAGNOSIS — N2581 Secondary hyperparathyroidism of renal origin: Secondary | ICD-10-CM | POA: Diagnosis not present

## 2013-05-06 DIAGNOSIS — D509 Iron deficiency anemia, unspecified: Secondary | ICD-10-CM | POA: Diagnosis not present

## 2013-05-06 DIAGNOSIS — E119 Type 2 diabetes mellitus without complications: Secondary | ICD-10-CM | POA: Diagnosis not present

## 2013-05-06 DIAGNOSIS — N186 End stage renal disease: Secondary | ICD-10-CM | POA: Diagnosis not present

## 2013-05-18 DIAGNOSIS — I4891 Unspecified atrial fibrillation: Secondary | ICD-10-CM | POA: Diagnosis not present

## 2013-06-04 DIAGNOSIS — N186 End stage renal disease: Secondary | ICD-10-CM | POA: Diagnosis not present

## 2013-06-06 DIAGNOSIS — E119 Type 2 diabetes mellitus without complications: Secondary | ICD-10-CM | POA: Diagnosis not present

## 2013-06-06 DIAGNOSIS — N186 End stage renal disease: Secondary | ICD-10-CM | POA: Diagnosis not present

## 2013-06-06 DIAGNOSIS — D631 Anemia in chronic kidney disease: Secondary | ICD-10-CM | POA: Diagnosis not present

## 2013-06-06 DIAGNOSIS — D509 Iron deficiency anemia, unspecified: Secondary | ICD-10-CM | POA: Diagnosis not present

## 2013-06-06 DIAGNOSIS — N2581 Secondary hyperparathyroidism of renal origin: Secondary | ICD-10-CM | POA: Diagnosis not present

## 2013-06-08 DIAGNOSIS — N2581 Secondary hyperparathyroidism of renal origin: Secondary | ICD-10-CM | POA: Diagnosis not present

## 2013-06-08 DIAGNOSIS — N186 End stage renal disease: Secondary | ICD-10-CM | POA: Diagnosis not present

## 2013-06-08 DIAGNOSIS — D631 Anemia in chronic kidney disease: Secondary | ICD-10-CM | POA: Diagnosis not present

## 2013-06-10 DIAGNOSIS — N186 End stage renal disease: Secondary | ICD-10-CM | POA: Diagnosis not present

## 2013-06-10 DIAGNOSIS — N2581 Secondary hyperparathyroidism of renal origin: Secondary | ICD-10-CM | POA: Diagnosis not present

## 2013-06-10 DIAGNOSIS — D631 Anemia in chronic kidney disease: Secondary | ICD-10-CM | POA: Diagnosis not present

## 2013-06-15 DIAGNOSIS — I4891 Unspecified atrial fibrillation: Secondary | ICD-10-CM | POA: Diagnosis not present

## 2013-06-20 DIAGNOSIS — I4891 Unspecified atrial fibrillation: Secondary | ICD-10-CM | POA: Diagnosis not present

## 2013-06-29 DIAGNOSIS — T82898A Other specified complication of vascular prosthetic devices, implants and grafts, initial encounter: Secondary | ICD-10-CM | POA: Diagnosis not present

## 2013-06-29 DIAGNOSIS — I825Z9 Chronic embolism and thrombosis of unspecified deep veins of unspecified distal lower extremity: Secondary | ICD-10-CM | POA: Diagnosis not present

## 2013-07-04 DIAGNOSIS — I82509 Chronic embolism and thrombosis of unspecified deep veins of unspecified lower extremity: Secondary | ICD-10-CM | POA: Diagnosis not present

## 2013-07-04 DIAGNOSIS — N186 End stage renal disease: Secondary | ICD-10-CM | POA: Diagnosis not present

## 2013-07-06 DIAGNOSIS — D509 Iron deficiency anemia, unspecified: Secondary | ICD-10-CM | POA: Diagnosis not present

## 2013-07-06 DIAGNOSIS — I82509 Chronic embolism and thrombosis of unspecified deep veins of unspecified lower extremity: Secondary | ICD-10-CM | POA: Diagnosis not present

## 2013-07-06 DIAGNOSIS — D631 Anemia in chronic kidney disease: Secondary | ICD-10-CM | POA: Diagnosis not present

## 2013-07-06 DIAGNOSIS — I4891 Unspecified atrial fibrillation: Secondary | ICD-10-CM | POA: Diagnosis not present

## 2013-07-06 DIAGNOSIS — E119 Type 2 diabetes mellitus without complications: Secondary | ICD-10-CM | POA: Diagnosis not present

## 2013-07-06 DIAGNOSIS — I509 Heart failure, unspecified: Secondary | ICD-10-CM | POA: Diagnosis not present

## 2013-07-06 DIAGNOSIS — N186 End stage renal disease: Secondary | ICD-10-CM | POA: Diagnosis not present

## 2013-07-06 DIAGNOSIS — N2581 Secondary hyperparathyroidism of renal origin: Secondary | ICD-10-CM | POA: Diagnosis not present

## 2013-07-13 DIAGNOSIS — I4891 Unspecified atrial fibrillation: Secondary | ICD-10-CM | POA: Diagnosis not present

## 2013-07-13 DIAGNOSIS — E1129 Type 2 diabetes mellitus with other diabetic kidney complication: Secondary | ICD-10-CM | POA: Diagnosis not present

## 2013-07-20 DIAGNOSIS — I4891 Unspecified atrial fibrillation: Secondary | ICD-10-CM | POA: Diagnosis not present

## 2013-08-04 DIAGNOSIS — N186 End stage renal disease: Secondary | ICD-10-CM | POA: Diagnosis not present

## 2013-08-05 DIAGNOSIS — D631 Anemia in chronic kidney disease: Secondary | ICD-10-CM | POA: Diagnosis not present

## 2013-08-05 DIAGNOSIS — D509 Iron deficiency anemia, unspecified: Secondary | ICD-10-CM | POA: Diagnosis not present

## 2013-08-05 DIAGNOSIS — Z1159 Encounter for screening for other viral diseases: Secondary | ICD-10-CM | POA: Diagnosis not present

## 2013-08-05 DIAGNOSIS — N186 End stage renal disease: Secondary | ICD-10-CM | POA: Diagnosis not present

## 2013-08-05 DIAGNOSIS — N2581 Secondary hyperparathyroidism of renal origin: Secondary | ICD-10-CM | POA: Diagnosis not present

## 2013-08-05 DIAGNOSIS — I509 Heart failure, unspecified: Secondary | ICD-10-CM | POA: Diagnosis not present

## 2013-08-05 DIAGNOSIS — E119 Type 2 diabetes mellitus without complications: Secondary | ICD-10-CM | POA: Diagnosis not present

## 2013-08-10 DIAGNOSIS — I2699 Other pulmonary embolism without acute cor pulmonale: Secondary | ICD-10-CM | POA: Diagnosis not present

## 2013-08-17 DIAGNOSIS — I4891 Unspecified atrial fibrillation: Secondary | ICD-10-CM | POA: Diagnosis not present

## 2013-09-03 DIAGNOSIS — N186 End stage renal disease: Secondary | ICD-10-CM | POA: Diagnosis not present

## 2013-09-05 DIAGNOSIS — N2581 Secondary hyperparathyroidism of renal origin: Secondary | ICD-10-CM | POA: Diagnosis not present

## 2013-09-05 DIAGNOSIS — D631 Anemia in chronic kidney disease: Secondary | ICD-10-CM | POA: Diagnosis not present

## 2013-09-05 DIAGNOSIS — E119 Type 2 diabetes mellitus without complications: Secondary | ICD-10-CM | POA: Diagnosis not present

## 2013-09-05 DIAGNOSIS — D509 Iron deficiency anemia, unspecified: Secondary | ICD-10-CM | POA: Diagnosis not present

## 2013-09-05 DIAGNOSIS — N186 End stage renal disease: Secondary | ICD-10-CM | POA: Diagnosis not present

## 2013-09-05 DIAGNOSIS — I509 Heart failure, unspecified: Secondary | ICD-10-CM | POA: Diagnosis not present

## 2013-09-25 DIAGNOSIS — N2581 Secondary hyperparathyroidism of renal origin: Secondary | ICD-10-CM | POA: Diagnosis not present

## 2013-09-25 DIAGNOSIS — N186 End stage renal disease: Secondary | ICD-10-CM | POA: Diagnosis not present

## 2013-09-27 DIAGNOSIS — N2581 Secondary hyperparathyroidism of renal origin: Secondary | ICD-10-CM | POA: Diagnosis not present

## 2013-09-27 DIAGNOSIS — N186 End stage renal disease: Secondary | ICD-10-CM | POA: Diagnosis not present

## 2013-09-29 DIAGNOSIS — N186 End stage renal disease: Secondary | ICD-10-CM | POA: Diagnosis not present

## 2013-09-29 DIAGNOSIS — N2581 Secondary hyperparathyroidism of renal origin: Secondary | ICD-10-CM | POA: Diagnosis not present

## 2013-10-01 ENCOUNTER — Emergency Department (HOSPITAL_COMMUNITY)
Admission: EM | Admit: 2013-10-01 | Discharge: 2013-10-01 | Disposition: A | Discharge status: Home / Self Care | Payer: Medicare Other | Attending: Emergency Medicine | Admitting: Emergency Medicine

## 2013-10-01 ENCOUNTER — Encounter (HOSPITAL_COMMUNITY): Payer: Self-pay | Admitting: Emergency Medicine

## 2013-10-01 DIAGNOSIS — Z992 Dependence on renal dialysis: Secondary | ICD-10-CM | POA: Diagnosis not present

## 2013-10-01 DIAGNOSIS — IMO0002 Reserved for concepts with insufficient information to code with codable children: Secondary | ICD-10-CM | POA: Insufficient documentation

## 2013-10-01 DIAGNOSIS — D649 Anemia, unspecified: Secondary | ICD-10-CM | POA: Insufficient documentation

## 2013-10-01 DIAGNOSIS — Z794 Long term (current) use of insulin: Secondary | ICD-10-CM | POA: Diagnosis not present

## 2013-10-01 DIAGNOSIS — Z792 Long term (current) use of antibiotics: Secondary | ICD-10-CM | POA: Insufficient documentation

## 2013-10-01 DIAGNOSIS — Z7901 Long term (current) use of anticoagulants: Secondary | ICD-10-CM | POA: Diagnosis not present

## 2013-10-01 DIAGNOSIS — K1379 Other lesions of oral mucosa: Secondary | ICD-10-CM

## 2013-10-01 DIAGNOSIS — Z79899 Other long term (current) drug therapy: Secondary | ICD-10-CM | POA: Diagnosis not present

## 2013-10-01 DIAGNOSIS — E1110 Type 2 diabetes mellitus with ketoacidosis without coma: Secondary | ICD-10-CM | POA: Insufficient documentation

## 2013-10-01 DIAGNOSIS — Z8669 Personal history of other diseases of the nervous system and sense organs: Secondary | ICD-10-CM | POA: Diagnosis not present

## 2013-10-01 DIAGNOSIS — I120 Hypertensive chronic kidney disease with stage 5 chronic kidney disease or end stage renal disease: Secondary | ICD-10-CM | POA: Insufficient documentation

## 2013-10-01 DIAGNOSIS — K148 Other diseases of tongue: Secondary | ICD-10-CM | POA: Insufficient documentation

## 2013-10-01 DIAGNOSIS — N186 End stage renal disease: Secondary | ICD-10-CM

## 2013-10-01 DIAGNOSIS — K056 Periodontal disease, unspecified: Secondary | ICD-10-CM | POA: Diagnosis not present

## 2013-10-01 LAB — PROTIME-INR
INR: 2.2 — ABNORMAL HIGH (ref 0.00–1.49)
Prothrombin Time: 23.7 seconds — ABNORMAL HIGH (ref 11.6–15.2)

## 2013-10-01 LAB — CBC
Hemoglobin: 9.5 g/dL — ABNORMAL LOW (ref 13.0–17.0)
MCH: 34.3 pg — ABNORMAL HIGH (ref 26.0–34.0)
MCV: 105.8 fL — ABNORMAL HIGH (ref 78.0–100.0)
Platelets: 320 10*3/uL (ref 150–400)
RBC: 2.77 MIL/uL — ABNORMAL LOW (ref 4.22–5.81)
WBC: 7.8 10*3/uL (ref 4.0–10.5)

## 2013-10-01 LAB — BASIC METABOLIC PANEL
CO2: 26 mEq/L (ref 19–32)
Chloride: 93 mEq/L — ABNORMAL LOW (ref 96–112)
Creatinine, Ser: 14.71 mg/dL — ABNORMAL HIGH (ref 0.50–1.35)
GFR calc Af Amer: 4 mL/min — ABNORMAL LOW (ref >90)
Glucose, Bld: 164 mg/dL — ABNORMAL HIGH (ref 70–99)
Potassium: 4.1 mEq/L (ref 3.5–5.1)
Sodium: 137 mEq/L (ref 135–145)

## 2013-10-01 NOTE — ED Notes (Signed)
Pt states he has been bleeding from mouth x 2 hours.

## 2013-10-01 NOTE — ED Notes (Signed)
Pt refused discharge vitals 

## 2013-10-01 NOTE — ED Notes (Signed)
States has tried ice and salt water without any stopping

## 2013-10-01 NOTE — ED Provider Notes (Signed)
CSN: [id number]     Arrival date & time 10/01/13  0612 History   First MD Initiated Contact with Patient 10/01/13 [phone]     Chief Complaint  Patient presents with  . Bleeding/Bruising   (Consider location/radiation/quality/duration/timing/severity/associated sxs/prior Treatment) HPI Pt presents with c/o bleeding from left side of tongue.  He takes coumadin for history of blood clots.  He thinks he may have bit his tongue in his sleep because he awoke with with area bleeding.  Has tried pressure, gargling with salt water and continues to have bleeding.  Due for dialysis this morning, but is concerned that the heparin may make bleeding worse.  No other areas of bleeding or bruising.  There are no other associated systemic symptoms, there are no other alleviating or modifying factors.   Past Medical History  Diagnosis Date  . Diabetes mellitus     diabetic ketoacidosis  . Morbid obesity   . OSA (obstructive sleep apnea)   . Septic shock(785.52)   . Anemia   . Hypertension    History reviewed. No pertinent past surgical history. No family history on file. History  Substance Use Topics  . Smoking status: Never Smoker   . Smokeless tobacco: Not on file  . Alcohol Use: Yes     Comment: Occasional wine use    Review of Systems ROS reviewed and all otherwise negative except for mentioned in HPI  Allergies  Aspirin  Home Medications   Current Outpatient Rx  Name  Route  Sig  Dispense  Refill  . allopurinol (ZYLOPRIM) 300 MG tablet   Oral   Take 300 mg by mouth 2 (two) times daily.         Marland Kitchen AMLODIPINE BESYLATE PO   Oral   Take 5 mg by mouth daily. Call pharmacy to verify dosage         . calcium acetate (PHOSLO) 667 MG capsule   Oral   Take 2,001 mg by mouth 3 (three) times daily with meals.          . clonazePAM (KLONOPIN) 1 MG tablet   Oral   Take 1 mg by mouth 2 (two) times daily as needed. For anxiety         . colchicine 0.6 MG tablet   Oral   Take 0.6 mg  by mouth daily.         . folic acid-vitamin b complex-vitamin c-selenium-zinc (DIALYVITE) 3 MG TABS   Oral   Take 1 tablet by mouth daily.           . hydrocortisone cream 1 %   Topical   Apply 1 application topically daily as needed. Apply to affected area for welts.         . insulin aspart (NOVOLOG) 100 UNIT/ML injection   Subcutaneous   Inject 35 Units into the skin 3 (three) times daily before meals.          . insulin aspart protamine-insulin aspart (NOVOLOG 70/30) (70-30) 100 UNIT/ML injection   Subcutaneous   Inject 35 Units into the skin 2 (two) times daily with a meal.          . insulin glargine (LANTUS) 100 UNIT/ML injection   Subcutaneous   Inject 70 Units into the skin at bedtime.          Marland Kitchen omeprazole (PRILOSEC) 20 MG capsule   Oral   Take 20 mg by mouth daily.           Marland Kitchen tobramycin-dexamethasone (  TOBRADEX) ophthalmic solution   Right Eye   Place 1 drop into the right eye 2 (two) times daily.         Marland Kitchen warfarin (COUMADIN) 10 MG tablet   Oral   Take 10 mg by mouth 2 (two) times a week. Monday & Friday.         . warfarin (COUMADIN) 7.5 MG tablet   Oral   Take 7.5 mg by mouth as directed. All days except Monday and Friday          BP 168/112  Pulse 111  Temp(Src) 98.2 F (36.8 C)  Resp 24  Ht 5\' 11"  (1.803 m)  Wt 456 lb 12.7 oz (207.2 kg)  BMI 63.74 kg/m2  SpO2 92% Vitals reivewed Physical Exam Physical Examination: General appearance - alert, chronically ill appearing, and in no distress, morbidly obese Mental status - alert, oriented to person, place, and time Eyes - no conjunctival injection, no scleral icterus Mouth - mucous membranes moist, pharynx normal without lesions, left buccal surface of tongue with 1mm area of abrasion which is bleeding Chest - clear to auscultation, no wheezes, rales or rhonchi, symmetric air entry Heart - normal rate, regular rhythm, normal S1, S2, no murmurs, rubs, clicks or gallops Extremities  - peripheral pulses normal, no pedal edema, no clubbing or cyanosis Skin - normal coloration and turgor, no rashes  ED Course  Procedures (including critical care time)  8:30 AM bleeding is slowed with direct pressure applied by me.  Labs reviewed.  Labs Review Labs Reviewed  PROTIME-INR - Abnormal; Notable for the following:    Prothrombin Time 23.7 (*)    INR 2.20 (*)    All other components within normal limits  CBC - Abnormal; Notable for the following:    RBC 2.77 (*)    Hemoglobin 9.5 (*)    HCT 29.3 (*)    MCV 105.8 (*)    MCH 34.3 (*)    RDW 15.9 (*)    All other components within normal limits  BASIC METABOLIC PANEL - Abnormal; Notable for the following:    Chloride 93 (*)    Glucose, Bld 164 (*)    BUN 83 (*)    Creatinine, Ser 14.71 (*)    GFR calc non Af Amer 3 (*)    GFR calc Af Amer 4 (*)    All other components within normal limits   Imaging Review No results found.  EKG Interpretation   None       MDM   1. Oral bleeding   2. End stage renal disease    Pt on coumadin due to hix of PE presenting with bleeding from tongue- bit his tongue prior to arrival.  After direct pressure bleeding has stopped, INR therapeutic.  Pt advised to go for his dialysis session today.  Discharged with strict return precautions.  Pt agreeable with plan.    Ethelda Chick, MD 10/01/13 [phone]

## 2013-10-01 NOTE — ED Notes (Signed)
Pt states has been bleeding x 2 hours from his tongue; no known injury; states takes coumadin--due for dialysis this morning and concerned about heparin

## 2013-10-04 DIAGNOSIS — N186 End stage renal disease: Secondary | ICD-10-CM | POA: Diagnosis not present

## 2013-10-07 DIAGNOSIS — I509 Heart failure, unspecified: Secondary | ICD-10-CM | POA: Diagnosis not present

## 2013-10-07 DIAGNOSIS — D509 Iron deficiency anemia, unspecified: Secondary | ICD-10-CM | POA: Diagnosis not present

## 2013-10-07 DIAGNOSIS — N2581 Secondary hyperparathyroidism of renal origin: Secondary | ICD-10-CM | POA: Diagnosis not present

## 2013-10-07 DIAGNOSIS — N039 Chronic nephritic syndrome with unspecified morphologic changes: Secondary | ICD-10-CM | POA: Diagnosis not present

## 2013-10-07 DIAGNOSIS — D631 Anemia in chronic kidney disease: Secondary | ICD-10-CM | POA: Diagnosis not present

## 2013-10-07 DIAGNOSIS — E119 Type 2 diabetes mellitus without complications: Secondary | ICD-10-CM | POA: Diagnosis not present

## 2013-10-07 DIAGNOSIS — N186 End stage renal disease: Secondary | ICD-10-CM | POA: Diagnosis not present

## 2013-10-09 DIAGNOSIS — N039 Chronic nephritic syndrome with unspecified morphologic changes: Secondary | ICD-10-CM | POA: Diagnosis not present

## 2013-10-09 DIAGNOSIS — D509 Iron deficiency anemia, unspecified: Secondary | ICD-10-CM | POA: Diagnosis not present

## 2013-10-09 DIAGNOSIS — D631 Anemia in chronic kidney disease: Secondary | ICD-10-CM | POA: Diagnosis not present

## 2013-10-09 DIAGNOSIS — E119 Type 2 diabetes mellitus without complications: Secondary | ICD-10-CM | POA: Diagnosis not present

## 2013-10-09 DIAGNOSIS — N186 End stage renal disease: Secondary | ICD-10-CM | POA: Diagnosis not present

## 2013-10-09 DIAGNOSIS — N2581 Secondary hyperparathyroidism of renal origin: Secondary | ICD-10-CM | POA: Diagnosis not present

## 2013-10-10 DIAGNOSIS — D631 Anemia in chronic kidney disease: Secondary | ICD-10-CM | POA: Diagnosis not present

## 2013-10-10 DIAGNOSIS — N186 End stage renal disease: Secondary | ICD-10-CM | POA: Diagnosis not present

## 2013-10-10 DIAGNOSIS — E119 Type 2 diabetes mellitus without complications: Secondary | ICD-10-CM | POA: Diagnosis not present

## 2013-10-10 DIAGNOSIS — N039 Chronic nephritic syndrome with unspecified morphologic changes: Secondary | ICD-10-CM | POA: Diagnosis not present

## 2013-10-10 DIAGNOSIS — N2581 Secondary hyperparathyroidism of renal origin: Secondary | ICD-10-CM | POA: Diagnosis not present

## 2013-10-10 DIAGNOSIS — D509 Iron deficiency anemia, unspecified: Secondary | ICD-10-CM | POA: Diagnosis not present

## 2013-10-12 DIAGNOSIS — D509 Iron deficiency anemia, unspecified: Secondary | ICD-10-CM | POA: Diagnosis not present

## 2013-10-12 DIAGNOSIS — D631 Anemia in chronic kidney disease: Secondary | ICD-10-CM | POA: Diagnosis not present

## 2013-10-12 DIAGNOSIS — E119 Type 2 diabetes mellitus without complications: Secondary | ICD-10-CM | POA: Diagnosis not present

## 2013-10-12 DIAGNOSIS — N039 Chronic nephritic syndrome with unspecified morphologic changes: Secondary | ICD-10-CM | POA: Diagnosis not present

## 2013-10-12 DIAGNOSIS — N186 End stage renal disease: Secondary | ICD-10-CM | POA: Diagnosis not present

## 2013-10-12 DIAGNOSIS — N2581 Secondary hyperparathyroidism of renal origin: Secondary | ICD-10-CM | POA: Diagnosis not present

## 2013-10-14 DIAGNOSIS — N2581 Secondary hyperparathyroidism of renal origin: Secondary | ICD-10-CM | POA: Diagnosis not present

## 2013-10-14 DIAGNOSIS — D509 Iron deficiency anemia, unspecified: Secondary | ICD-10-CM | POA: Diagnosis not present

## 2013-10-14 DIAGNOSIS — D631 Anemia in chronic kidney disease: Secondary | ICD-10-CM | POA: Diagnosis not present

## 2013-10-14 DIAGNOSIS — N186 End stage renal disease: Secondary | ICD-10-CM | POA: Diagnosis not present

## 2013-10-14 DIAGNOSIS — E119 Type 2 diabetes mellitus without complications: Secondary | ICD-10-CM | POA: Diagnosis not present

## 2013-10-16 DIAGNOSIS — D631 Anemia in chronic kidney disease: Secondary | ICD-10-CM | POA: Diagnosis not present

## 2013-10-16 DIAGNOSIS — E119 Type 2 diabetes mellitus without complications: Secondary | ICD-10-CM | POA: Diagnosis not present

## 2013-10-16 DIAGNOSIS — N2581 Secondary hyperparathyroidism of renal origin: Secondary | ICD-10-CM | POA: Diagnosis not present

## 2013-10-16 DIAGNOSIS — N186 End stage renal disease: Secondary | ICD-10-CM | POA: Diagnosis not present

## 2013-10-16 DIAGNOSIS — N039 Chronic nephritic syndrome with unspecified morphologic changes: Secondary | ICD-10-CM | POA: Diagnosis not present

## 2013-10-16 DIAGNOSIS — D509 Iron deficiency anemia, unspecified: Secondary | ICD-10-CM | POA: Diagnosis not present

## 2013-10-17 DIAGNOSIS — N2581 Secondary hyperparathyroidism of renal origin: Secondary | ICD-10-CM | POA: Diagnosis not present

## 2013-10-17 DIAGNOSIS — N186 End stage renal disease: Secondary | ICD-10-CM | POA: Diagnosis not present

## 2013-10-17 DIAGNOSIS — I4891 Unspecified atrial fibrillation: Secondary | ICD-10-CM | POA: Diagnosis not present

## 2013-10-17 DIAGNOSIS — D631 Anemia in chronic kidney disease: Secondary | ICD-10-CM | POA: Diagnosis not present

## 2013-10-17 DIAGNOSIS — E119 Type 2 diabetes mellitus without complications: Secondary | ICD-10-CM | POA: Diagnosis not present

## 2013-10-17 DIAGNOSIS — D509 Iron deficiency anemia, unspecified: Secondary | ICD-10-CM | POA: Diagnosis not present

## 2013-10-19 DIAGNOSIS — D631 Anemia in chronic kidney disease: Secondary | ICD-10-CM | POA: Diagnosis not present

## 2013-10-19 DIAGNOSIS — D509 Iron deficiency anemia, unspecified: Secondary | ICD-10-CM | POA: Diagnosis not present

## 2013-10-19 DIAGNOSIS — E119 Type 2 diabetes mellitus without complications: Secondary | ICD-10-CM | POA: Diagnosis not present

## 2013-10-19 DIAGNOSIS — E1129 Type 2 diabetes mellitus with other diabetic kidney complication: Secondary | ICD-10-CM | POA: Diagnosis not present

## 2013-10-19 DIAGNOSIS — N186 End stage renal disease: Secondary | ICD-10-CM | POA: Diagnosis not present

## 2013-10-19 DIAGNOSIS — N2581 Secondary hyperparathyroidism of renal origin: Secondary | ICD-10-CM | POA: Diagnosis not present

## 2013-10-19 DIAGNOSIS — N039 Chronic nephritic syndrome with unspecified morphologic changes: Secondary | ICD-10-CM | POA: Diagnosis not present

## 2013-10-19 DIAGNOSIS — I4891 Unspecified atrial fibrillation: Secondary | ICD-10-CM | POA: Diagnosis not present

## 2013-10-21 DIAGNOSIS — D631 Anemia in chronic kidney disease: Secondary | ICD-10-CM | POA: Diagnosis not present

## 2013-10-21 DIAGNOSIS — N186 End stage renal disease: Secondary | ICD-10-CM | POA: Diagnosis not present

## 2013-10-21 DIAGNOSIS — N2581 Secondary hyperparathyroidism of renal origin: Secondary | ICD-10-CM | POA: Diagnosis not present

## 2013-10-21 DIAGNOSIS — E119 Type 2 diabetes mellitus without complications: Secondary | ICD-10-CM | POA: Diagnosis not present

## 2013-10-21 DIAGNOSIS — N039 Chronic nephritic syndrome with unspecified morphologic changes: Secondary | ICD-10-CM | POA: Diagnosis not present

## 2013-10-21 DIAGNOSIS — D509 Iron deficiency anemia, unspecified: Secondary | ICD-10-CM | POA: Diagnosis not present

## 2013-10-23 DIAGNOSIS — N186 End stage renal disease: Secondary | ICD-10-CM | POA: Diagnosis not present

## 2013-10-23 DIAGNOSIS — N2581 Secondary hyperparathyroidism of renal origin: Secondary | ICD-10-CM | POA: Diagnosis not present

## 2013-10-23 DIAGNOSIS — N039 Chronic nephritic syndrome with unspecified morphologic changes: Secondary | ICD-10-CM | POA: Diagnosis not present

## 2013-10-23 DIAGNOSIS — D631 Anemia in chronic kidney disease: Secondary | ICD-10-CM | POA: Diagnosis not present

## 2013-10-23 DIAGNOSIS — D509 Iron deficiency anemia, unspecified: Secondary | ICD-10-CM | POA: Diagnosis not present

## 2013-10-23 DIAGNOSIS — E119 Type 2 diabetes mellitus without complications: Secondary | ICD-10-CM | POA: Diagnosis not present

## 2013-10-24 DIAGNOSIS — N2581 Secondary hyperparathyroidism of renal origin: Secondary | ICD-10-CM | POA: Diagnosis not present

## 2013-10-24 DIAGNOSIS — D509 Iron deficiency anemia, unspecified: Secondary | ICD-10-CM | POA: Diagnosis not present

## 2013-10-24 DIAGNOSIS — D631 Anemia in chronic kidney disease: Secondary | ICD-10-CM | POA: Diagnosis not present

## 2013-10-24 DIAGNOSIS — N186 End stage renal disease: Secondary | ICD-10-CM | POA: Diagnosis not present

## 2013-10-24 DIAGNOSIS — E119 Type 2 diabetes mellitus without complications: Secondary | ICD-10-CM | POA: Diagnosis not present

## 2013-10-26 DIAGNOSIS — N039 Chronic nephritic syndrome with unspecified morphologic changes: Secondary | ICD-10-CM | POA: Diagnosis not present

## 2013-10-26 DIAGNOSIS — D631 Anemia in chronic kidney disease: Secondary | ICD-10-CM | POA: Diagnosis not present

## 2013-10-26 DIAGNOSIS — E119 Type 2 diabetes mellitus without complications: Secondary | ICD-10-CM | POA: Diagnosis not present

## 2013-10-26 DIAGNOSIS — N186 End stage renal disease: Secondary | ICD-10-CM | POA: Diagnosis not present

## 2013-10-26 DIAGNOSIS — D509 Iron deficiency anemia, unspecified: Secondary | ICD-10-CM | POA: Diagnosis not present

## 2013-10-26 DIAGNOSIS — N2581 Secondary hyperparathyroidism of renal origin: Secondary | ICD-10-CM | POA: Diagnosis not present

## 2013-10-27 DIAGNOSIS — I871 Compression of vein: Secondary | ICD-10-CM | POA: Diagnosis not present

## 2013-10-27 DIAGNOSIS — N186 End stage renal disease: Secondary | ICD-10-CM | POA: Diagnosis not present

## 2013-10-27 DIAGNOSIS — T82898A Other specified complication of vascular prosthetic devices, implants and grafts, initial encounter: Secondary | ICD-10-CM | POA: Diagnosis not present

## 2013-10-28 DIAGNOSIS — D631 Anemia in chronic kidney disease: Secondary | ICD-10-CM | POA: Diagnosis not present

## 2013-10-28 DIAGNOSIS — N2581 Secondary hyperparathyroidism of renal origin: Secondary | ICD-10-CM | POA: Diagnosis not present

## 2013-10-28 DIAGNOSIS — N186 End stage renal disease: Secondary | ICD-10-CM | POA: Diagnosis not present

## 2013-10-28 DIAGNOSIS — E119 Type 2 diabetes mellitus without complications: Secondary | ICD-10-CM | POA: Diagnosis not present

## 2013-10-28 DIAGNOSIS — D509 Iron deficiency anemia, unspecified: Secondary | ICD-10-CM | POA: Diagnosis not present

## 2013-10-30 DIAGNOSIS — N186 End stage renal disease: Secondary | ICD-10-CM | POA: Diagnosis not present

## 2013-10-30 DIAGNOSIS — D509 Iron deficiency anemia, unspecified: Secondary | ICD-10-CM | POA: Diagnosis not present

## 2013-10-30 DIAGNOSIS — N039 Chronic nephritic syndrome with unspecified morphologic changes: Secondary | ICD-10-CM | POA: Diagnosis not present

## 2013-10-30 DIAGNOSIS — D631 Anemia in chronic kidney disease: Secondary | ICD-10-CM | POA: Diagnosis not present

## 2013-10-31 DIAGNOSIS — D509 Iron deficiency anemia, unspecified: Secondary | ICD-10-CM | POA: Diagnosis not present

## 2013-10-31 DIAGNOSIS — N186 End stage renal disease: Secondary | ICD-10-CM | POA: Diagnosis not present

## 2013-10-31 DIAGNOSIS — D631 Anemia in chronic kidney disease: Secondary | ICD-10-CM | POA: Diagnosis not present

## 2013-10-31 DIAGNOSIS — N039 Chronic nephritic syndrome with unspecified morphologic changes: Secondary | ICD-10-CM | POA: Diagnosis not present

## 2013-11-01 DIAGNOSIS — N186 End stage renal disease: Secondary | ICD-10-CM | POA: Diagnosis not present

## 2013-11-01 DIAGNOSIS — D631 Anemia in chronic kidney disease: Secondary | ICD-10-CM | POA: Diagnosis not present

## 2013-11-01 DIAGNOSIS — N039 Chronic nephritic syndrome with unspecified morphologic changes: Secondary | ICD-10-CM | POA: Diagnosis not present

## 2013-11-01 DIAGNOSIS — D509 Iron deficiency anemia, unspecified: Secondary | ICD-10-CM | POA: Diagnosis not present

## 2013-11-02 DIAGNOSIS — D631 Anemia in chronic kidney disease: Secondary | ICD-10-CM | POA: Diagnosis not present

## 2013-11-02 DIAGNOSIS — N186 End stage renal disease: Secondary | ICD-10-CM | POA: Diagnosis not present

## 2013-11-02 DIAGNOSIS — D509 Iron deficiency anemia, unspecified: Secondary | ICD-10-CM | POA: Diagnosis not present

## 2013-11-03 DIAGNOSIS — D631 Anemia in chronic kidney disease: Secondary | ICD-10-CM | POA: Diagnosis not present

## 2013-11-03 DIAGNOSIS — N186 End stage renal disease: Secondary | ICD-10-CM | POA: Diagnosis not present

## 2013-11-03 DIAGNOSIS — D509 Iron deficiency anemia, unspecified: Secondary | ICD-10-CM | POA: Diagnosis not present

## 2013-11-04 DIAGNOSIS — N186 End stage renal disease: Secondary | ICD-10-CM | POA: Diagnosis not present

## 2013-11-06 DIAGNOSIS — N186 End stage renal disease: Secondary | ICD-10-CM | POA: Diagnosis not present

## 2013-11-06 DIAGNOSIS — Z992 Dependence on renal dialysis: Secondary | ICD-10-CM | POA: Diagnosis not present

## 2013-11-06 DIAGNOSIS — D631 Anemia in chronic kidney disease: Secondary | ICD-10-CM | POA: Diagnosis not present

## 2013-11-06 DIAGNOSIS — N2581 Secondary hyperparathyroidism of renal origin: Secondary | ICD-10-CM | POA: Diagnosis not present

## 2013-11-06 DIAGNOSIS — D509 Iron deficiency anemia, unspecified: Secondary | ICD-10-CM | POA: Diagnosis not present

## 2013-11-07 DIAGNOSIS — Z992 Dependence on renal dialysis: Secondary | ICD-10-CM | POA: Diagnosis not present

## 2013-11-15 DIAGNOSIS — D689 Coagulation defect, unspecified: Secondary | ICD-10-CM | POA: Diagnosis not present

## 2013-12-02 DIAGNOSIS — N186 End stage renal disease: Secondary | ICD-10-CM | POA: Diagnosis not present

## 2013-12-04 DIAGNOSIS — D509 Iron deficiency anemia, unspecified: Secondary | ICD-10-CM | POA: Diagnosis not present

## 2013-12-04 DIAGNOSIS — N2581 Secondary hyperparathyroidism of renal origin: Secondary | ICD-10-CM | POA: Diagnosis not present

## 2013-12-04 DIAGNOSIS — N186 End stage renal disease: Secondary | ICD-10-CM | POA: Diagnosis not present

## 2013-12-05 DIAGNOSIS — N2581 Secondary hyperparathyroidism of renal origin: Secondary | ICD-10-CM | POA: Diagnosis not present

## 2013-12-05 DIAGNOSIS — N186 End stage renal disease: Secondary | ICD-10-CM | POA: Diagnosis not present

## 2013-12-05 DIAGNOSIS — D509 Iron deficiency anemia, unspecified: Secondary | ICD-10-CM | POA: Diagnosis not present

## 2013-12-06 DIAGNOSIS — N186 End stage renal disease: Secondary | ICD-10-CM | POA: Diagnosis not present

## 2013-12-06 DIAGNOSIS — N2581 Secondary hyperparathyroidism of renal origin: Secondary | ICD-10-CM | POA: Diagnosis not present

## 2013-12-06 DIAGNOSIS — D509 Iron deficiency anemia, unspecified: Secondary | ICD-10-CM | POA: Diagnosis not present

## 2013-12-07 DIAGNOSIS — E878 Other disorders of electrolyte and fluid balance, not elsewhere classified: Secondary | ICD-10-CM | POA: Diagnosis not present

## 2013-12-07 DIAGNOSIS — M109 Gout, unspecified: Secondary | ICD-10-CM | POA: Diagnosis not present

## 2013-12-07 DIAGNOSIS — N186 End stage renal disease: Secondary | ICD-10-CM | POA: Diagnosis not present

## 2013-12-07 DIAGNOSIS — I10 Essential (primary) hypertension: Secondary | ICD-10-CM | POA: Diagnosis not present

## 2013-12-07 DIAGNOSIS — E875 Hyperkalemia: Secondary | ICD-10-CM | POA: Diagnosis not present

## 2013-12-07 DIAGNOSIS — R7309 Other abnormal glucose: Secondary | ICD-10-CM | POA: Diagnosis not present

## 2013-12-07 DIAGNOSIS — Z79899 Other long term (current) drug therapy: Secondary | ICD-10-CM | POA: Diagnosis not present

## 2013-12-07 DIAGNOSIS — D539 Nutritional anemia, unspecified: Secondary | ICD-10-CM | POA: Diagnosis not present

## 2013-12-07 DIAGNOSIS — D631 Anemia in chronic kidney disease: Secondary | ICD-10-CM | POA: Diagnosis not present

## 2013-12-07 DIAGNOSIS — D509 Iron deficiency anemia, unspecified: Secondary | ICD-10-CM | POA: Diagnosis not present

## 2013-12-07 DIAGNOSIS — N2581 Secondary hyperparathyroidism of renal origin: Secondary | ICD-10-CM | POA: Diagnosis not present

## 2013-12-11 DIAGNOSIS — D689 Coagulation defect, unspecified: Secondary | ICD-10-CM | POA: Diagnosis not present

## 2013-12-19 ENCOUNTER — Emergency Department (HOSPITAL_COMMUNITY)
Admission: EM | Admit: 2013-12-19 | Discharge: 2013-12-19 | Disposition: A | Discharge status: Home / Self Care | Payer: Medicare Other | Attending: Emergency Medicine | Admitting: Emergency Medicine

## 2013-12-19 ENCOUNTER — Encounter (HOSPITAL_COMMUNITY): Payer: Self-pay | Admitting: Emergency Medicine

## 2013-12-19 DIAGNOSIS — E119 Type 2 diabetes mellitus without complications: Secondary | ICD-10-CM | POA: Diagnosis not present

## 2013-12-19 DIAGNOSIS — N186 End stage renal disease: Secondary | ICD-10-CM | POA: Insufficient documentation

## 2013-12-19 DIAGNOSIS — I120 Hypertensive chronic kidney disease with stage 5 chronic kidney disease or end stage renal disease: Secondary | ICD-10-CM | POA: Diagnosis not present

## 2013-12-19 DIAGNOSIS — Z7901 Long term (current) use of anticoagulants: Secondary | ICD-10-CM | POA: Insufficient documentation

## 2013-12-19 DIAGNOSIS — Z79899 Other long term (current) drug therapy: Secondary | ICD-10-CM | POA: Diagnosis not present

## 2013-12-19 DIAGNOSIS — Y939 Activity, unspecified: Secondary | ICD-10-CM | POA: Insufficient documentation

## 2013-12-19 DIAGNOSIS — S01502A Unspecified open wound of oral cavity, initial encounter: Secondary | ICD-10-CM | POA: Diagnosis not present

## 2013-12-19 DIAGNOSIS — Z8669 Personal history of other diseases of the nervous system and sense organs: Secondary | ICD-10-CM | POA: Diagnosis not present

## 2013-12-19 DIAGNOSIS — I10 Essential (primary) hypertension: Secondary | ICD-10-CM | POA: Diagnosis not present

## 2013-12-19 DIAGNOSIS — X58XXXA Exposure to other specified factors, initial encounter: Secondary | ICD-10-CM | POA: Insufficient documentation

## 2013-12-19 DIAGNOSIS — D649 Anemia, unspecified: Secondary | ICD-10-CM | POA: Insufficient documentation

## 2013-12-19 DIAGNOSIS — S01512A Laceration without foreign body of oral cavity, initial encounter: Secondary | ICD-10-CM

## 2013-12-19 DIAGNOSIS — Z794 Long term (current) use of insulin: Secondary | ICD-10-CM | POA: Insufficient documentation

## 2013-12-19 DIAGNOSIS — Y929 Unspecified place or not applicable: Secondary | ICD-10-CM | POA: Insufficient documentation

## 2013-12-19 LAB — PROTIME-INR
INR: 3.27 — ABNORMAL HIGH (ref 0.00–1.49)
Prothrombin Time: 32.1 seconds — ABNORMAL HIGH (ref 11.6–15.2)

## 2013-12-19 MED ORDER — THROMBI-PAD 3"X3" EX PADS
1.0000 | MEDICATED_PAD | Freq: Once | CUTANEOUS | Status: DC
  Filled 2013-12-19: qty 1

## 2013-12-19 NOTE — ED Notes (Signed)
Patient states that he was eating pretzels and bit his tongue. Patient has slight bleeding a this time.

## 2013-12-19 NOTE — Discharge Instructions (Signed)
Eat a clear liquid diet for 48 hours to prevent re-opening of the wound  Return to the emergency department if you develop any changing/worsening condition, continued bleeding, fever, drainage of pus, or any other concerns (please read additional information regarding your condition below)     Tongue Laceration  A tongue laceration is a cut on the tongue. Over the next 1 to 2 days, you will see that the wound edges appear gray in color. The edges may appear ragged and slightly spread apart. Because of all the normal bacteria in the mouth, these wounds are contaminated, but this is not an infection that needs antibiotics. Most wounds heal with no problems despite their appearance. TREATMENT  Most tongue lacerations only go partway through the tongue. This type of injury generally does not need stitches (sutures). More serious injuries penetrate the tongue deeper and may need sutures and follow-up care. HOME CARE INSTRUCTIONS   Cover an ice cube in a thin cloth and hold it directly on the cut for 1 to 3 minutes at a time, 6 to 10 times per day. Do this for 1 day. This can help reduce pain and swelling.  After the first day, rinse the mouth with a warm, saltwater wash 4 to 6 times per day, or as your caregiver instructs.  If there are no injuries to your teeth, continue oral hygiene and gentle brushing. Do not brush loose or broken teeth or teeth that have been put back into normal position by your caregiver.  Large or complex cuts may require antibiotics to prevent infection. Take your antibiotics as directed. Finish them even if you start to feel better.  Do not eat or drink hot food or beverages while your mouth is still numb.  Do not eat hard foods (such as apples) or chewy foods (such as broiled meat) until your caregiver advises you otherwise.  If your caregiver used sutures to repair the cut, do not pull or chew them. If you do this, they will gradually loosen and may become  untied.  Only take over-the-counter or prescription medicines for pain, discomfort, or fever as directed by your caregiver. You may need a tetanus shot if:  You cannot remember when you had your last tetanus shot.  You have never had a tetanus shot. If you get a tetanus shot, your arm may swell, get red, and feel warm to the touch. This is common and not a problem. If you need a tetanus shot and you choose not to have one, there is a rare chance of getting tetanus. Sickness from tetanus can be serious. SEEK IMMEDIATE MEDICAL CARE IF:   You develop swelling or increasing pain in the wound or in other parts of your mouth or face.  You see pus coming from the wound. Some drainage in the mouth is normal.  You have a fever.  You notice the edges of the wound break open after sutures have been removed.  You develop bleeding that does not stop when you apply pressure.  You develop any breathing problems. MAKE SURE YOU:  Understand these instructions.  Will watch your condition.  Will get help right away if you are not doing well or get worse. Document Released: 09/21/2006 Document Revised: 12/14/2011 Document Reviewed: 03/26/2011 [facility] Patient Information 2014 [employer], [place].

## 2013-12-19 NOTE — ED Provider Notes (Signed)
CSN: [id number]     Arrival date & time 12/19/13  1729 History  This chart was scribed for non-physician practitioner Coral CeoJessica Haiven Nardone, PA-C working with Laray AngerKathleen M McManus, DO by Dorothey Basemania Sutton, ED Scribe. This patient was seen in room WTR8/WTR8 and the patient's care was started at 6:40 PM.    Chief Complaint  Patient presents with  . tongue injury    The history is provided by the patient. No language interpreter was used.   HPI Comments: Shane Hamilton is a [age] y.o. male with a history of ESRD, DM, OSA, anemia, and HTN who presents to the Emergency Department complaining of a tongue injury. Patient states that this afternoon he bit his tongue while eating pretzels. Patient sustained a laceration to the tongue, which has continued to bleed since onset of injury. Patient also has constant pain with associated mild swelling to the area secondary to the injury. He reports applying pressure to the area and using a warm salt water rinse without cessation of bleeding. There is still a moderate amount of bleeding at this time. Patient takes Coumadin for a hx of blood clots.  He denies fever, hematemesis, dizziness, or lightheadedness. Denies significant bleeding, however, reports continued bleeding. He reports that his tetanus vaccination is UTD.    Past Medical History  Diagnosis Date  . Diabetes mellitus     diabetic ketoacidosis  . Morbid obesity   . OSA (obstructive sleep apnea)   . Septic shock(785.52)   . Anemia   . Hypertension    History reviewed. No pertinent past surgical history. History reviewed. No pertinent family history. History  Substance Use Topics  . Smoking status: Never Smoker   . Smokeless tobacco: Never Used  . Alcohol Use: Yes     Comment: Occasional wine use    Review of Systems  Constitutional: Negative for fever.  HENT: Negative for drooling, sore throat, trouble swallowing and voice change.   Gastrointestinal: Negative for nausea, vomiting and abdominal pain.   Skin: Positive for wound.  Neurological: Negative for dizziness, weakness and light-headedness.  Hematological: Bruises/bleeds easily.  All other systems reviewed and are negative.   Allergies  Aspirin  Home Medications   Current Outpatient Rx  Name  Route  Sig  Dispense  Refill  . allopurinol (ZYLOPRIM) 300 MG tablet   Oral   Take 300 mg by mouth 2 (two) times daily.         Marland Kitchen. AMLODIPINE BESYLATE PO   Oral   Take 5 mg by mouth daily. Call pharmacy to verify dosage         . calcium acetate (PHOSLO) 667 MG capsule   Oral   Take 2,001 mg by mouth 3 (three) times daily with meals.          . clonazePAM (KLONOPIN) 1 MG tablet   Oral   Take 1 mg by mouth 2 (two) times daily as needed. For anxiety         . colchicine 0.6 MG tablet   Oral   Take 0.6 mg by mouth daily.         . folic acid-vitamin b complex-vitamin c-selenium-zinc (DIALYVITE) 3 MG TABS   Oral   Take 1 tablet by mouth daily.           . hydrocortisone cream 1 %   Topical   Apply 1 application topically daily as needed. Apply to affected area for welts.         . insulin aspart (  NOVOLOG) 100 UNIT/ML injection   Subcutaneous   Inject 35 Units into the skin 3 (three) times daily before meals.          . insulin aspart protamine-insulin aspart (NOVOLOG 70/30) (70-30) 100 UNIT/ML injection   Subcutaneous   Inject 35 Units into the skin 2 (two) times daily with a meal.          . insulin glargine (LANTUS) 100 UNIT/ML injection   Subcutaneous   Inject 70 Units into the skin at bedtime.          Marland Kitchen omeprazole (PRILOSEC) 20 MG capsule   Oral   Take 20 mg by mouth daily.           Marland Kitchen tobramycin-dexamethasone (TOBRADEX) ophthalmic solution   Right Eye   Place 1 drop into the right eye 2 (two) times daily.         Marland Kitchen warfarin (COUMADIN) 10 MG tablet   Oral   Take 10 mg by mouth 2 (two) times a week. Monday & Friday.         . warfarin (COUMADIN) 7.5 MG tablet   Oral   Take 7.5  mg by mouth as directed. All days except Monday and Friday          BP 135/79  Pulse 111  Resp 22  SpO2 95%  Filed Vitals:   12/19/13 1743  BP: 135/79  Pulse: 111  Resp: 22  SpO2: 95%    Physical Exam  Nursing note and vitals reviewed. Constitutional: He is oriented to person, place, and time. He appears well-developed and well-nourished. No distress.  Patient spitting blood into an emesis basin  HENT:  Head: Normocephalic and atraumatic.  Right Ear: Hearing, tympanic membrane, external ear and ear canal normal.  Left Ear: Hearing, tympanic membrane, external ear and ear canal normal.  Nose: Nose normal.  Mouth/Throat: Oropharynx is clear and moist. Lacerations present.    0.5 cm superficial linear laceration to the anterior left tip of the tongue with active bleeding. Patient able to protrude and retract tongue without difficulty or limitations. No trismus. No difficulty controlling secretions.   Eyes: Conjunctivae are normal. Pupils are equal, round, and reactive to light. Right eye exhibits no discharge. Left eye exhibits no discharge.  Neck: Normal range of motion. Neck supple.  Cardiovascular: Normal rate, regular rhythm and normal heart sounds.  Exam reveals no gallop and no friction rub.   No murmur heard. Pulmonary/Chest: Effort normal and breath sounds normal. No respiratory distress. He has no wheezes. He has no rales. He exhibits no tenderness.  Abdominal: Soft. He exhibits no distension.  Musculoskeletal: Normal range of motion.  Neurological: He is alert and oriented to person, place, and time.  Skin: Skin is warm and dry. He is not diaphoretic.  Psychiatric: He has a normal mood and affect. His behavior is normal.    ED Course  Procedures (including critical care time)  DIAGNOSTIC STUDIES: Oxygen Saturation is 95% on room air, normal by my interpretation.     Labs Review Labs Reviewed  PROTIME-INR   Imaging Review No results found.   EKG  Interpretation None      Results for orders placed during the hospital encounter of 12/19/13  PROTIME-INR      Result Value Ref Range   Prothrombin Time 32.1 (*) 11.6 - 15.2 seconds   INR 3.27 (*) 0.00 - 1.49    MDM   Shane Hamilton is a 48 y.o. male with a  history of ESRD, DM, OSA, anemia, and HTN who presents to the Emergency Department complaining of a tongue injury.  Rechecks  6:43 PM = Will continue to apply pressure to the area. Ordered INR check.  7:28 PM = At recheck, the area is still bleeding. Will consult with Dr. Clarene Duke. Will order a thrombi pad.  8:00 PM = Bleeding controlled with thrombi pad. Will continue to hold pressure and re-check. Signing out care to [employer] PA-C to continue to monitor.     Patient evaluated for a self-sustained tongue laceration from biting his tongue. Patient has continued bleeding likely due to warfarin. Patient's INR elevated at 3.27 exacerbating the bleeding. BP controlled. Direct pressure unable to control bleeding. Thrombi-pad applied which appears to be controlling the bleeding. Signed out care to [facility] PA-C who will continue to monitor for cessation of bleeding and likely discharge.     Discharge Medication List as of 12/19/2013  8:24 PM      Final impressions: 1. Tongue laceration      Luiz Iron PA-C   This patient was discussed with Dr. Clarene Duke   I personally performed the services described in this documentation, which was scribed in my presence. The recorded information has been reviewed and is accurate.       Jillyn Ledger, PA-C 12/20/13 [phone]

## 2013-12-22 DIAGNOSIS — N186 End stage renal disease: Secondary | ICD-10-CM | POA: Diagnosis not present

## 2013-12-22 DIAGNOSIS — L0293 Carbuncle, unspecified: Secondary | ICD-10-CM | POA: Diagnosis not present

## 2013-12-22 DIAGNOSIS — L0292 Furuncle, unspecified: Secondary | ICD-10-CM | POA: Diagnosis not present

## 2013-12-22 DIAGNOSIS — R0602 Shortness of breath: Secondary | ICD-10-CM | POA: Diagnosis not present

## 2013-12-22 DIAGNOSIS — R5381 Other malaise: Secondary | ICD-10-CM | POA: Diagnosis not present

## 2013-12-22 NOTE — ED Provider Notes (Signed)
Medical screening examination/treatment/procedure(s) were performed by non-physician practitioner and as supervising physician I was immediately available for consultation/collaboration.   EKG Interpretation None        Laray AngerKathleen M Christelle Igoe, DO 12/22/13 1353

## 2013-12-26 ENCOUNTER — Institutional Professional Consult (permissible substitution): Payer: Medicare Other | Admitting: Internal Medicine

## 2013-12-31 DIAGNOSIS — R0602 Shortness of breath: Secondary | ICD-10-CM | POA: Diagnosis not present

## 2014-01-02 DIAGNOSIS — N186 End stage renal disease: Secondary | ICD-10-CM | POA: Diagnosis not present

## 2014-01-03 DIAGNOSIS — E875 Hyperkalemia: Secondary | ICD-10-CM | POA: Diagnosis not present

## 2014-01-03 DIAGNOSIS — N2581 Secondary hyperparathyroidism of renal origin: Secondary | ICD-10-CM | POA: Diagnosis not present

## 2014-01-03 DIAGNOSIS — D509 Iron deficiency anemia, unspecified: Secondary | ICD-10-CM | POA: Diagnosis not present

## 2014-01-03 DIAGNOSIS — D631 Anemia in chronic kidney disease: Secondary | ICD-10-CM | POA: Diagnosis not present

## 2014-01-03 DIAGNOSIS — R7309 Other abnormal glucose: Secondary | ICD-10-CM | POA: Diagnosis not present

## 2014-01-03 DIAGNOSIS — I10 Essential (primary) hypertension: Secondary | ICD-10-CM | POA: Diagnosis not present

## 2014-01-03 DIAGNOSIS — D539 Nutritional anemia, unspecified: Secondary | ICD-10-CM | POA: Diagnosis not present

## 2014-01-03 DIAGNOSIS — N186 End stage renal disease: Secondary | ICD-10-CM | POA: Diagnosis not present

## 2014-01-04 DIAGNOSIS — N2581 Secondary hyperparathyroidism of renal origin: Secondary | ICD-10-CM | POA: Diagnosis not present

## 2014-01-04 DIAGNOSIS — R7309 Other abnormal glucose: Secondary | ICD-10-CM | POA: Diagnosis not present

## 2014-01-04 DIAGNOSIS — D509 Iron deficiency anemia, unspecified: Secondary | ICD-10-CM | POA: Diagnosis not present

## 2014-01-04 DIAGNOSIS — N186 End stage renal disease: Secondary | ICD-10-CM | POA: Diagnosis not present

## 2014-01-04 DIAGNOSIS — D539 Nutritional anemia, unspecified: Secondary | ICD-10-CM | POA: Diagnosis not present

## 2014-01-04 DIAGNOSIS — D631 Anemia in chronic kidney disease: Secondary | ICD-10-CM | POA: Diagnosis not present

## 2014-01-05 DIAGNOSIS — N2581 Secondary hyperparathyroidism of renal origin: Secondary | ICD-10-CM | POA: Diagnosis not present

## 2014-01-05 DIAGNOSIS — D509 Iron deficiency anemia, unspecified: Secondary | ICD-10-CM | POA: Diagnosis not present

## 2014-01-05 DIAGNOSIS — D539 Nutritional anemia, unspecified: Secondary | ICD-10-CM | POA: Diagnosis not present

## 2014-01-05 DIAGNOSIS — N186 End stage renal disease: Secondary | ICD-10-CM | POA: Diagnosis not present

## 2014-01-05 DIAGNOSIS — D631 Anemia in chronic kidney disease: Secondary | ICD-10-CM | POA: Diagnosis not present

## 2014-01-05 DIAGNOSIS — R7309 Other abnormal glucose: Secondary | ICD-10-CM | POA: Diagnosis not present

## 2014-01-06 DIAGNOSIS — R7309 Other abnormal glucose: Secondary | ICD-10-CM | POA: Diagnosis not present

## 2014-01-06 DIAGNOSIS — D539 Nutritional anemia, unspecified: Secondary | ICD-10-CM | POA: Diagnosis not present

## 2014-01-06 DIAGNOSIS — N2581 Secondary hyperparathyroidism of renal origin: Secondary | ICD-10-CM | POA: Diagnosis not present

## 2014-01-06 DIAGNOSIS — D631 Anemia in chronic kidney disease: Secondary | ICD-10-CM | POA: Diagnosis not present

## 2014-01-06 DIAGNOSIS — N186 End stage renal disease: Secondary | ICD-10-CM | POA: Diagnosis not present

## 2014-01-06 DIAGNOSIS — D509 Iron deficiency anemia, unspecified: Secondary | ICD-10-CM | POA: Diagnosis not present

## 2014-01-08 DIAGNOSIS — N186 End stage renal disease: Secondary | ICD-10-CM | POA: Diagnosis not present

## 2014-01-08 DIAGNOSIS — N2581 Secondary hyperparathyroidism of renal origin: Secondary | ICD-10-CM | POA: Diagnosis not present

## 2014-01-08 DIAGNOSIS — D631 Anemia in chronic kidney disease: Secondary | ICD-10-CM | POA: Diagnosis not present

## 2014-01-08 DIAGNOSIS — D509 Iron deficiency anemia, unspecified: Secondary | ICD-10-CM | POA: Diagnosis not present

## 2014-01-08 DIAGNOSIS — R7309 Other abnormal glucose: Secondary | ICD-10-CM | POA: Diagnosis not present

## 2014-01-08 DIAGNOSIS — D539 Nutritional anemia, unspecified: Secondary | ICD-10-CM | POA: Diagnosis not present

## 2014-01-09 DIAGNOSIS — D539 Nutritional anemia, unspecified: Secondary | ICD-10-CM | POA: Diagnosis not present

## 2014-01-09 DIAGNOSIS — R7309 Other abnormal glucose: Secondary | ICD-10-CM | POA: Diagnosis not present

## 2014-01-09 DIAGNOSIS — D509 Iron deficiency anemia, unspecified: Secondary | ICD-10-CM | POA: Diagnosis not present

## 2014-01-09 DIAGNOSIS — N2581 Secondary hyperparathyroidism of renal origin: Secondary | ICD-10-CM | POA: Diagnosis not present

## 2014-01-09 DIAGNOSIS — N186 End stage renal disease: Secondary | ICD-10-CM | POA: Diagnosis not present

## 2014-01-09 DIAGNOSIS — D631 Anemia in chronic kidney disease: Secondary | ICD-10-CM | POA: Diagnosis not present

## 2014-01-10 DIAGNOSIS — D631 Anemia in chronic kidney disease: Secondary | ICD-10-CM | POA: Diagnosis not present

## 2014-01-10 DIAGNOSIS — N186 End stage renal disease: Secondary | ICD-10-CM | POA: Diagnosis not present

## 2014-01-10 DIAGNOSIS — R7309 Other abnormal glucose: Secondary | ICD-10-CM | POA: Diagnosis not present

## 2014-01-10 DIAGNOSIS — D539 Nutritional anemia, unspecified: Secondary | ICD-10-CM | POA: Diagnosis not present

## 2014-01-10 DIAGNOSIS — D509 Iron deficiency anemia, unspecified: Secondary | ICD-10-CM | POA: Diagnosis not present

## 2014-01-10 DIAGNOSIS — N2581 Secondary hyperparathyroidism of renal origin: Secondary | ICD-10-CM | POA: Diagnosis not present

## 2014-01-11 DIAGNOSIS — D509 Iron deficiency anemia, unspecified: Secondary | ICD-10-CM | POA: Diagnosis not present

## 2014-01-11 DIAGNOSIS — D539 Nutritional anemia, unspecified: Secondary | ICD-10-CM | POA: Diagnosis not present

## 2014-01-11 DIAGNOSIS — R7309 Other abnormal glucose: Secondary | ICD-10-CM | POA: Diagnosis not present

## 2014-01-11 DIAGNOSIS — N2581 Secondary hyperparathyroidism of renal origin: Secondary | ICD-10-CM | POA: Diagnosis not present

## 2014-01-11 DIAGNOSIS — D631 Anemia in chronic kidney disease: Secondary | ICD-10-CM | POA: Diagnosis not present

## 2014-01-11 DIAGNOSIS — N186 End stage renal disease: Secondary | ICD-10-CM | POA: Diagnosis not present

## 2014-01-12 DIAGNOSIS — D509 Iron deficiency anemia, unspecified: Secondary | ICD-10-CM | POA: Diagnosis not present

## 2014-01-12 DIAGNOSIS — N186 End stage renal disease: Secondary | ICD-10-CM | POA: Diagnosis not present

## 2014-01-12 DIAGNOSIS — D539 Nutritional anemia, unspecified: Secondary | ICD-10-CM | POA: Diagnosis not present

## 2014-01-12 DIAGNOSIS — D631 Anemia in chronic kidney disease: Secondary | ICD-10-CM | POA: Diagnosis not present

## 2014-01-12 DIAGNOSIS — R7309 Other abnormal glucose: Secondary | ICD-10-CM | POA: Diagnosis not present

## 2014-01-12 DIAGNOSIS — N2581 Secondary hyperparathyroidism of renal origin: Secondary | ICD-10-CM | POA: Diagnosis not present

## 2014-01-13 DIAGNOSIS — D631 Anemia in chronic kidney disease: Secondary | ICD-10-CM | POA: Diagnosis not present

## 2014-01-13 DIAGNOSIS — N2581 Secondary hyperparathyroidism of renal origin: Secondary | ICD-10-CM | POA: Diagnosis not present

## 2014-01-13 DIAGNOSIS — N186 End stage renal disease: Secondary | ICD-10-CM | POA: Diagnosis not present

## 2014-01-13 DIAGNOSIS — R7309 Other abnormal glucose: Secondary | ICD-10-CM | POA: Diagnosis not present

## 2014-01-13 DIAGNOSIS — D509 Iron deficiency anemia, unspecified: Secondary | ICD-10-CM | POA: Diagnosis not present

## 2014-01-13 DIAGNOSIS — D539 Nutritional anemia, unspecified: Secondary | ICD-10-CM | POA: Diagnosis not present

## 2014-01-14 DIAGNOSIS — D509 Iron deficiency anemia, unspecified: Secondary | ICD-10-CM | POA: Diagnosis not present

## 2014-01-14 DIAGNOSIS — N186 End stage renal disease: Secondary | ICD-10-CM | POA: Diagnosis not present

## 2014-01-14 DIAGNOSIS — D539 Nutritional anemia, unspecified: Secondary | ICD-10-CM | POA: Diagnosis not present

## 2014-01-14 DIAGNOSIS — N2581 Secondary hyperparathyroidism of renal origin: Secondary | ICD-10-CM | POA: Diagnosis not present

## 2014-01-14 DIAGNOSIS — D631 Anemia in chronic kidney disease: Secondary | ICD-10-CM | POA: Diagnosis not present

## 2014-01-14 DIAGNOSIS — R7309 Other abnormal glucose: Secondary | ICD-10-CM | POA: Diagnosis not present

## 2014-01-15 DIAGNOSIS — N2581 Secondary hyperparathyroidism of renal origin: Secondary | ICD-10-CM | POA: Diagnosis not present

## 2014-01-15 DIAGNOSIS — R7309 Other abnormal glucose: Secondary | ICD-10-CM | POA: Diagnosis not present

## 2014-01-15 DIAGNOSIS — D509 Iron deficiency anemia, unspecified: Secondary | ICD-10-CM | POA: Diagnosis not present

## 2014-01-15 DIAGNOSIS — D539 Nutritional anemia, unspecified: Secondary | ICD-10-CM | POA: Diagnosis not present

## 2014-01-15 DIAGNOSIS — D631 Anemia in chronic kidney disease: Secondary | ICD-10-CM | POA: Diagnosis not present

## 2014-01-15 DIAGNOSIS — N186 End stage renal disease: Secondary | ICD-10-CM | POA: Diagnosis not present

## 2014-01-16 DIAGNOSIS — D509 Iron deficiency anemia, unspecified: Secondary | ICD-10-CM | POA: Diagnosis not present

## 2014-01-16 DIAGNOSIS — D631 Anemia in chronic kidney disease: Secondary | ICD-10-CM | POA: Diagnosis not present

## 2014-01-16 DIAGNOSIS — N2581 Secondary hyperparathyroidism of renal origin: Secondary | ICD-10-CM | POA: Diagnosis not present

## 2014-01-16 DIAGNOSIS — N186 End stage renal disease: Secondary | ICD-10-CM | POA: Diagnosis not present

## 2014-01-16 DIAGNOSIS — D539 Nutritional anemia, unspecified: Secondary | ICD-10-CM | POA: Diagnosis not present

## 2014-01-16 DIAGNOSIS — R7309 Other abnormal glucose: Secondary | ICD-10-CM | POA: Diagnosis not present

## 2014-01-17 ENCOUNTER — Other Ambulatory Visit (HOSPITAL_COMMUNITY): Payer: Medicare Other

## 2014-01-17 ENCOUNTER — Other Ambulatory Visit (HOSPITAL_COMMUNITY): Payer: Self-pay | Admitting: Radiology

## 2014-01-17 DIAGNOSIS — R7309 Other abnormal glucose: Secondary | ICD-10-CM | POA: Diagnosis not present

## 2014-01-17 DIAGNOSIS — R0989 Other specified symptoms and signs involving the circulatory and respiratory systems: Principal | ICD-10-CM

## 2014-01-17 DIAGNOSIS — D539 Nutritional anemia, unspecified: Secondary | ICD-10-CM | POA: Diagnosis not present

## 2014-01-17 DIAGNOSIS — R0609 Other forms of dyspnea: Secondary | ICD-10-CM

## 2014-01-17 DIAGNOSIS — N186 End stage renal disease: Secondary | ICD-10-CM | POA: Diagnosis not present

## 2014-01-17 DIAGNOSIS — D631 Anemia in chronic kidney disease: Secondary | ICD-10-CM | POA: Diagnosis not present

## 2014-01-17 DIAGNOSIS — D509 Iron deficiency anemia, unspecified: Secondary | ICD-10-CM | POA: Diagnosis not present

## 2014-01-17 DIAGNOSIS — N2581 Secondary hyperparathyroidism of renal origin: Secondary | ICD-10-CM | POA: Diagnosis not present

## 2014-01-18 DIAGNOSIS — N186 End stage renal disease: Secondary | ICD-10-CM | POA: Diagnosis not present

## 2014-01-18 DIAGNOSIS — D539 Nutritional anemia, unspecified: Secondary | ICD-10-CM | POA: Diagnosis not present

## 2014-01-18 DIAGNOSIS — R7309 Other abnormal glucose: Secondary | ICD-10-CM | POA: Diagnosis not present

## 2014-01-18 DIAGNOSIS — D509 Iron deficiency anemia, unspecified: Secondary | ICD-10-CM | POA: Diagnosis not present

## 2014-01-18 DIAGNOSIS — D631 Anemia in chronic kidney disease: Secondary | ICD-10-CM | POA: Diagnosis not present

## 2014-01-18 DIAGNOSIS — N2581 Secondary hyperparathyroidism of renal origin: Secondary | ICD-10-CM | POA: Diagnosis not present

## 2014-01-19 DIAGNOSIS — D509 Iron deficiency anemia, unspecified: Secondary | ICD-10-CM | POA: Diagnosis not present

## 2014-01-19 DIAGNOSIS — R7309 Other abnormal glucose: Secondary | ICD-10-CM | POA: Diagnosis not present

## 2014-01-19 DIAGNOSIS — D631 Anemia in chronic kidney disease: Secondary | ICD-10-CM | POA: Diagnosis not present

## 2014-01-19 DIAGNOSIS — N186 End stage renal disease: Secondary | ICD-10-CM | POA: Diagnosis not present

## 2014-01-19 DIAGNOSIS — N2581 Secondary hyperparathyroidism of renal origin: Secondary | ICD-10-CM | POA: Diagnosis not present

## 2014-01-19 DIAGNOSIS — D539 Nutritional anemia, unspecified: Secondary | ICD-10-CM | POA: Diagnosis not present

## 2014-01-20 DIAGNOSIS — N2581 Secondary hyperparathyroidism of renal origin: Secondary | ICD-10-CM | POA: Diagnosis not present

## 2014-01-20 DIAGNOSIS — D631 Anemia in chronic kidney disease: Secondary | ICD-10-CM | POA: Diagnosis not present

## 2014-01-20 DIAGNOSIS — N186 End stage renal disease: Secondary | ICD-10-CM | POA: Diagnosis not present

## 2014-01-20 DIAGNOSIS — D539 Nutritional anemia, unspecified: Secondary | ICD-10-CM | POA: Diagnosis not present

## 2014-01-20 DIAGNOSIS — D509 Iron deficiency anemia, unspecified: Secondary | ICD-10-CM | POA: Diagnosis not present

## 2014-01-20 DIAGNOSIS — R7309 Other abnormal glucose: Secondary | ICD-10-CM | POA: Diagnosis not present

## 2014-01-22 ENCOUNTER — Encounter (HOSPITAL_COMMUNITY): Payer: Self-pay | Admitting: Cardiology

## 2014-01-22 DIAGNOSIS — N2581 Secondary hyperparathyroidism of renal origin: Secondary | ICD-10-CM | POA: Diagnosis not present

## 2014-01-22 DIAGNOSIS — D509 Iron deficiency anemia, unspecified: Secondary | ICD-10-CM | POA: Diagnosis not present

## 2014-01-22 DIAGNOSIS — R7309 Other abnormal glucose: Secondary | ICD-10-CM | POA: Diagnosis not present

## 2014-01-22 DIAGNOSIS — N186 End stage renal disease: Secondary | ICD-10-CM | POA: Diagnosis not present

## 2014-01-22 DIAGNOSIS — D631 Anemia in chronic kidney disease: Secondary | ICD-10-CM | POA: Diagnosis not present

## 2014-01-22 DIAGNOSIS — D539 Nutritional anemia, unspecified: Secondary | ICD-10-CM | POA: Diagnosis not present

## 2014-01-23 DIAGNOSIS — D509 Iron deficiency anemia, unspecified: Secondary | ICD-10-CM | POA: Diagnosis not present

## 2014-01-23 DIAGNOSIS — D689 Coagulation defect, unspecified: Secondary | ICD-10-CM | POA: Diagnosis not present

## 2014-01-23 DIAGNOSIS — N186 End stage renal disease: Secondary | ICD-10-CM | POA: Diagnosis not present

## 2014-01-23 DIAGNOSIS — M109 Gout, unspecified: Secondary | ICD-10-CM | POA: Diagnosis not present

## 2014-01-23 DIAGNOSIS — D539 Nutritional anemia, unspecified: Secondary | ICD-10-CM | POA: Diagnosis not present

## 2014-01-23 DIAGNOSIS — N039 Chronic nephritic syndrome with unspecified morphologic changes: Secondary | ICD-10-CM | POA: Diagnosis not present

## 2014-01-23 DIAGNOSIS — E119 Type 2 diabetes mellitus without complications: Secondary | ICD-10-CM | POA: Diagnosis not present

## 2014-01-23 DIAGNOSIS — E785 Hyperlipidemia, unspecified: Secondary | ICD-10-CM | POA: Diagnosis not present

## 2014-01-23 DIAGNOSIS — R7309 Other abnormal glucose: Secondary | ICD-10-CM | POA: Diagnosis not present

## 2014-01-23 DIAGNOSIS — N2581 Secondary hyperparathyroidism of renal origin: Secondary | ICD-10-CM | POA: Diagnosis not present

## 2014-01-23 DIAGNOSIS — D631 Anemia in chronic kidney disease: Secondary | ICD-10-CM | POA: Diagnosis not present

## 2014-01-24 DIAGNOSIS — N039 Chronic nephritic syndrome with unspecified morphologic changes: Secondary | ICD-10-CM | POA: Diagnosis not present

## 2014-01-24 DIAGNOSIS — R7309 Other abnormal glucose: Secondary | ICD-10-CM | POA: Diagnosis not present

## 2014-01-24 DIAGNOSIS — D631 Anemia in chronic kidney disease: Secondary | ICD-10-CM | POA: Diagnosis not present

## 2014-01-24 DIAGNOSIS — D539 Nutritional anemia, unspecified: Secondary | ICD-10-CM | POA: Diagnosis not present

## 2014-01-24 DIAGNOSIS — N2581 Secondary hyperparathyroidism of renal origin: Secondary | ICD-10-CM | POA: Diagnosis not present

## 2014-01-24 DIAGNOSIS — D509 Iron deficiency anemia, unspecified: Secondary | ICD-10-CM | POA: Diagnosis not present

## 2014-01-24 DIAGNOSIS — N186 End stage renal disease: Secondary | ICD-10-CM | POA: Diagnosis not present

## 2014-01-26 ENCOUNTER — Institutional Professional Consult (permissible substitution): Payer: Medicare Other | Admitting: Internal Medicine

## 2014-01-26 DIAGNOSIS — D509 Iron deficiency anemia, unspecified: Secondary | ICD-10-CM | POA: Diagnosis not present

## 2014-01-26 DIAGNOSIS — N2581 Secondary hyperparathyroidism of renal origin: Secondary | ICD-10-CM | POA: Diagnosis not present

## 2014-01-26 DIAGNOSIS — N186 End stage renal disease: Secondary | ICD-10-CM | POA: Diagnosis not present

## 2014-01-26 DIAGNOSIS — D539 Nutritional anemia, unspecified: Secondary | ICD-10-CM | POA: Diagnosis not present

## 2014-01-26 DIAGNOSIS — R7309 Other abnormal glucose: Secondary | ICD-10-CM | POA: Diagnosis not present

## 2014-01-26 DIAGNOSIS — D631 Anemia in chronic kidney disease: Secondary | ICD-10-CM | POA: Diagnosis not present

## 2014-01-29 DIAGNOSIS — D509 Iron deficiency anemia, unspecified: Secondary | ICD-10-CM | POA: Diagnosis not present

## 2014-01-29 DIAGNOSIS — N2581 Secondary hyperparathyroidism of renal origin: Secondary | ICD-10-CM | POA: Diagnosis not present

## 2014-01-29 DIAGNOSIS — R7309 Other abnormal glucose: Secondary | ICD-10-CM | POA: Diagnosis not present

## 2014-01-29 DIAGNOSIS — D631 Anemia in chronic kidney disease: Secondary | ICD-10-CM | POA: Diagnosis not present

## 2014-01-29 DIAGNOSIS — D539 Nutritional anemia, unspecified: Secondary | ICD-10-CM | POA: Diagnosis not present

## 2014-01-29 DIAGNOSIS — N186 End stage renal disease: Secondary | ICD-10-CM | POA: Diagnosis not present

## 2014-01-30 DIAGNOSIS — N2581 Secondary hyperparathyroidism of renal origin: Secondary | ICD-10-CM | POA: Diagnosis not present

## 2014-01-30 DIAGNOSIS — D539 Nutritional anemia, unspecified: Secondary | ICD-10-CM | POA: Diagnosis not present

## 2014-01-30 DIAGNOSIS — N186 End stage renal disease: Secondary | ICD-10-CM | POA: Diagnosis not present

## 2014-01-30 DIAGNOSIS — R7309 Other abnormal glucose: Secondary | ICD-10-CM | POA: Diagnosis not present

## 2014-01-30 DIAGNOSIS — D509 Iron deficiency anemia, unspecified: Secondary | ICD-10-CM | POA: Diagnosis not present

## 2014-01-30 DIAGNOSIS — D631 Anemia in chronic kidney disease: Secondary | ICD-10-CM | POA: Diagnosis not present

## 2014-01-31 DIAGNOSIS — N186 End stage renal disease: Secondary | ICD-10-CM | POA: Diagnosis not present

## 2014-01-31 DIAGNOSIS — D539 Nutritional anemia, unspecified: Secondary | ICD-10-CM | POA: Diagnosis not present

## 2014-01-31 DIAGNOSIS — R7309 Other abnormal glucose: Secondary | ICD-10-CM | POA: Diagnosis not present

## 2014-01-31 DIAGNOSIS — N2581 Secondary hyperparathyroidism of renal origin: Secondary | ICD-10-CM | POA: Diagnosis not present

## 2014-01-31 DIAGNOSIS — D631 Anemia in chronic kidney disease: Secondary | ICD-10-CM | POA: Diagnosis not present

## 2014-01-31 DIAGNOSIS — D509 Iron deficiency anemia, unspecified: Secondary | ICD-10-CM | POA: Diagnosis not present

## 2014-01-31 DIAGNOSIS — N039 Chronic nephritic syndrome with unspecified morphologic changes: Secondary | ICD-10-CM | POA: Diagnosis not present

## 2014-02-01 DIAGNOSIS — D539 Nutritional anemia, unspecified: Secondary | ICD-10-CM | POA: Diagnosis not present

## 2014-02-01 DIAGNOSIS — N2581 Secondary hyperparathyroidism of renal origin: Secondary | ICD-10-CM | POA: Diagnosis not present

## 2014-02-01 DIAGNOSIS — N186 End stage renal disease: Secondary | ICD-10-CM | POA: Diagnosis not present

## 2014-02-01 DIAGNOSIS — D631 Anemia in chronic kidney disease: Secondary | ICD-10-CM | POA: Diagnosis not present

## 2014-02-01 DIAGNOSIS — D509 Iron deficiency anemia, unspecified: Secondary | ICD-10-CM | POA: Diagnosis not present

## 2014-02-01 DIAGNOSIS — R7309 Other abnormal glucose: Secondary | ICD-10-CM | POA: Diagnosis not present

## 2014-02-03 DIAGNOSIS — N039 Chronic nephritic syndrome with unspecified morphologic changes: Secondary | ICD-10-CM | POA: Diagnosis not present

## 2014-02-03 DIAGNOSIS — R7309 Other abnormal glucose: Secondary | ICD-10-CM | POA: Diagnosis not present

## 2014-02-03 DIAGNOSIS — E875 Hyperkalemia: Secondary | ICD-10-CM | POA: Diagnosis not present

## 2014-02-03 DIAGNOSIS — Z79899 Other long term (current) drug therapy: Secondary | ICD-10-CM | POA: Diagnosis not present

## 2014-02-03 DIAGNOSIS — D631 Anemia in chronic kidney disease: Secondary | ICD-10-CM | POA: Diagnosis not present

## 2014-02-03 DIAGNOSIS — N186 End stage renal disease: Secondary | ICD-10-CM | POA: Diagnosis not present

## 2014-02-03 DIAGNOSIS — I10 Essential (primary) hypertension: Secondary | ICD-10-CM | POA: Diagnosis not present

## 2014-02-03 DIAGNOSIS — D509 Iron deficiency anemia, unspecified: Secondary | ICD-10-CM | POA: Diagnosis not present

## 2014-02-03 DIAGNOSIS — D539 Nutritional anemia, unspecified: Secondary | ICD-10-CM | POA: Diagnosis not present

## 2014-02-05 ENCOUNTER — Institutional Professional Consult (permissible substitution): Payer: Medicare Other | Admitting: Pulmonary Disease

## 2014-02-08 DIAGNOSIS — Z79899 Other long term (current) drug therapy: Secondary | ICD-10-CM | POA: Diagnosis not present

## 2014-02-08 DIAGNOSIS — D539 Nutritional anemia, unspecified: Secondary | ICD-10-CM | POA: Diagnosis not present

## 2014-02-08 DIAGNOSIS — D509 Iron deficiency anemia, unspecified: Secondary | ICD-10-CM | POA: Diagnosis not present

## 2014-02-08 DIAGNOSIS — D631 Anemia in chronic kidney disease: Secondary | ICD-10-CM | POA: Diagnosis not present

## 2014-02-08 DIAGNOSIS — N186 End stage renal disease: Secondary | ICD-10-CM | POA: Diagnosis not present

## 2014-02-08 DIAGNOSIS — R7309 Other abnormal glucose: Secondary | ICD-10-CM | POA: Diagnosis not present

## 2014-02-12 DIAGNOSIS — Z79899 Other long term (current) drug therapy: Secondary | ICD-10-CM | POA: Diagnosis not present

## 2014-02-12 DIAGNOSIS — N186 End stage renal disease: Secondary | ICD-10-CM | POA: Diagnosis not present

## 2014-02-12 DIAGNOSIS — D509 Iron deficiency anemia, unspecified: Secondary | ICD-10-CM | POA: Diagnosis not present

## 2014-02-12 DIAGNOSIS — D539 Nutritional anemia, unspecified: Secondary | ICD-10-CM | POA: Diagnosis not present

## 2014-02-12 DIAGNOSIS — R7309 Other abnormal glucose: Secondary | ICD-10-CM | POA: Diagnosis not present

## 2014-02-12 DIAGNOSIS — D689 Coagulation defect, unspecified: Secondary | ICD-10-CM | POA: Diagnosis not present

## 2014-02-12 DIAGNOSIS — D631 Anemia in chronic kidney disease: Secondary | ICD-10-CM | POA: Diagnosis not present

## 2014-02-13 DIAGNOSIS — D509 Iron deficiency anemia, unspecified: Secondary | ICD-10-CM | POA: Diagnosis not present

## 2014-02-13 DIAGNOSIS — D539 Nutritional anemia, unspecified: Secondary | ICD-10-CM | POA: Diagnosis not present

## 2014-02-13 DIAGNOSIS — R7309 Other abnormal glucose: Secondary | ICD-10-CM | POA: Diagnosis not present

## 2014-02-13 DIAGNOSIS — Z79899 Other long term (current) drug therapy: Secondary | ICD-10-CM | POA: Diagnosis not present

## 2014-02-13 DIAGNOSIS — M109 Gout, unspecified: Secondary | ICD-10-CM | POA: Diagnosis not present

## 2014-02-13 DIAGNOSIS — D631 Anemia in chronic kidney disease: Secondary | ICD-10-CM | POA: Diagnosis not present

## 2014-02-13 DIAGNOSIS — N186 End stage renal disease: Secondary | ICD-10-CM | POA: Diagnosis not present

## 2014-02-15 ENCOUNTER — Other Ambulatory Visit (HOSPITAL_COMMUNITY): Payer: Medicare Other

## 2014-02-16 DIAGNOSIS — D539 Nutritional anemia, unspecified: Secondary | ICD-10-CM | POA: Diagnosis not present

## 2014-02-16 DIAGNOSIS — D631 Anemia in chronic kidney disease: Secondary | ICD-10-CM | POA: Diagnosis not present

## 2014-02-16 DIAGNOSIS — D509 Iron deficiency anemia, unspecified: Secondary | ICD-10-CM | POA: Diagnosis not present

## 2014-02-16 DIAGNOSIS — N039 Chronic nephritic syndrome with unspecified morphologic changes: Secondary | ICD-10-CM | POA: Diagnosis not present

## 2014-02-16 DIAGNOSIS — N186 End stage renal disease: Secondary | ICD-10-CM | POA: Diagnosis not present

## 2014-02-16 DIAGNOSIS — R7309 Other abnormal glucose: Secondary | ICD-10-CM | POA: Diagnosis not present

## 2014-02-16 DIAGNOSIS — Z79899 Other long term (current) drug therapy: Secondary | ICD-10-CM | POA: Diagnosis not present

## 2014-02-17 DIAGNOSIS — D509 Iron deficiency anemia, unspecified: Secondary | ICD-10-CM | POA: Diagnosis not present

## 2014-02-17 DIAGNOSIS — D631 Anemia in chronic kidney disease: Secondary | ICD-10-CM | POA: Diagnosis not present

## 2014-02-17 DIAGNOSIS — N186 End stage renal disease: Secondary | ICD-10-CM | POA: Diagnosis not present

## 2014-02-17 DIAGNOSIS — N039 Chronic nephritic syndrome with unspecified morphologic changes: Secondary | ICD-10-CM | POA: Diagnosis not present

## 2014-02-17 DIAGNOSIS — R7309 Other abnormal glucose: Secondary | ICD-10-CM | POA: Diagnosis not present

## 2014-02-17 DIAGNOSIS — D539 Nutritional anemia, unspecified: Secondary | ICD-10-CM | POA: Diagnosis not present

## 2014-02-17 DIAGNOSIS — Z79899 Other long term (current) drug therapy: Secondary | ICD-10-CM | POA: Diagnosis not present

## 2014-02-18 DIAGNOSIS — D631 Anemia in chronic kidney disease: Secondary | ICD-10-CM | POA: Diagnosis not present

## 2014-02-18 DIAGNOSIS — D509 Iron deficiency anemia, unspecified: Secondary | ICD-10-CM | POA: Diagnosis not present

## 2014-02-18 DIAGNOSIS — R7309 Other abnormal glucose: Secondary | ICD-10-CM | POA: Diagnosis not present

## 2014-02-18 DIAGNOSIS — N186 End stage renal disease: Secondary | ICD-10-CM | POA: Diagnosis not present

## 2014-02-18 DIAGNOSIS — D539 Nutritional anemia, unspecified: Secondary | ICD-10-CM | POA: Diagnosis not present

## 2014-02-18 DIAGNOSIS — Z79899 Other long term (current) drug therapy: Secondary | ICD-10-CM | POA: Diagnosis not present

## 2014-02-19 DIAGNOSIS — R7309 Other abnormal glucose: Secondary | ICD-10-CM | POA: Diagnosis not present

## 2014-02-19 DIAGNOSIS — D539 Nutritional anemia, unspecified: Secondary | ICD-10-CM | POA: Diagnosis not present

## 2014-02-19 DIAGNOSIS — D631 Anemia in chronic kidney disease: Secondary | ICD-10-CM | POA: Diagnosis not present

## 2014-02-19 DIAGNOSIS — Z79899 Other long term (current) drug therapy: Secondary | ICD-10-CM | POA: Diagnosis not present

## 2014-02-19 DIAGNOSIS — N186 End stage renal disease: Secondary | ICD-10-CM | POA: Diagnosis not present

## 2014-02-19 DIAGNOSIS — D509 Iron deficiency anemia, unspecified: Secondary | ICD-10-CM | POA: Diagnosis not present

## 2014-02-20 DIAGNOSIS — N186 End stage renal disease: Secondary | ICD-10-CM | POA: Diagnosis not present

## 2014-02-20 DIAGNOSIS — R7309 Other abnormal glucose: Secondary | ICD-10-CM | POA: Diagnosis not present

## 2014-02-20 DIAGNOSIS — D539 Nutritional anemia, unspecified: Secondary | ICD-10-CM | POA: Diagnosis not present

## 2014-02-20 DIAGNOSIS — Z79899 Other long term (current) drug therapy: Secondary | ICD-10-CM | POA: Diagnosis not present

## 2014-02-20 DIAGNOSIS — D631 Anemia in chronic kidney disease: Secondary | ICD-10-CM | POA: Diagnosis not present

## 2014-02-20 DIAGNOSIS — D509 Iron deficiency anemia, unspecified: Secondary | ICD-10-CM | POA: Diagnosis not present

## 2014-02-20 DIAGNOSIS — N039 Chronic nephritic syndrome with unspecified morphologic changes: Secondary | ICD-10-CM | POA: Diagnosis not present

## 2014-02-21 DIAGNOSIS — Z79899 Other long term (current) drug therapy: Secondary | ICD-10-CM | POA: Diagnosis not present

## 2014-02-21 DIAGNOSIS — D509 Iron deficiency anemia, unspecified: Secondary | ICD-10-CM | POA: Diagnosis not present

## 2014-02-21 DIAGNOSIS — N186 End stage renal disease: Secondary | ICD-10-CM | POA: Diagnosis not present

## 2014-02-21 DIAGNOSIS — R7309 Other abnormal glucose: Secondary | ICD-10-CM | POA: Diagnosis not present

## 2014-02-21 DIAGNOSIS — D631 Anemia in chronic kidney disease: Secondary | ICD-10-CM | POA: Diagnosis not present

## 2014-02-21 DIAGNOSIS — D539 Nutritional anemia, unspecified: Secondary | ICD-10-CM | POA: Diagnosis not present

## 2014-02-22 DIAGNOSIS — D539 Nutritional anemia, unspecified: Secondary | ICD-10-CM | POA: Diagnosis not present

## 2014-02-22 DIAGNOSIS — Z79899 Other long term (current) drug therapy: Secondary | ICD-10-CM | POA: Diagnosis not present

## 2014-02-22 DIAGNOSIS — D509 Iron deficiency anemia, unspecified: Secondary | ICD-10-CM | POA: Diagnosis not present

## 2014-02-22 DIAGNOSIS — D631 Anemia in chronic kidney disease: Secondary | ICD-10-CM | POA: Diagnosis not present

## 2014-02-22 DIAGNOSIS — R7309 Other abnormal glucose: Secondary | ICD-10-CM | POA: Diagnosis not present

## 2014-02-22 DIAGNOSIS — N186 End stage renal disease: Secondary | ICD-10-CM | POA: Diagnosis not present

## 2014-02-23 DIAGNOSIS — N186 End stage renal disease: Secondary | ICD-10-CM | POA: Diagnosis not present

## 2014-02-23 DIAGNOSIS — D539 Nutritional anemia, unspecified: Secondary | ICD-10-CM | POA: Diagnosis not present

## 2014-02-23 DIAGNOSIS — Z79899 Other long term (current) drug therapy: Secondary | ICD-10-CM | POA: Diagnosis not present

## 2014-02-23 DIAGNOSIS — D631 Anemia in chronic kidney disease: Secondary | ICD-10-CM | POA: Diagnosis not present

## 2014-02-23 DIAGNOSIS — D509 Iron deficiency anemia, unspecified: Secondary | ICD-10-CM | POA: Diagnosis not present

## 2014-02-23 DIAGNOSIS — R7309 Other abnormal glucose: Secondary | ICD-10-CM | POA: Diagnosis not present

## 2014-02-24 DIAGNOSIS — N039 Chronic nephritic syndrome with unspecified morphologic changes: Secondary | ICD-10-CM | POA: Diagnosis not present

## 2014-02-24 DIAGNOSIS — Z79899 Other long term (current) drug therapy: Secondary | ICD-10-CM | POA: Diagnosis not present

## 2014-02-24 DIAGNOSIS — D509 Iron deficiency anemia, unspecified: Secondary | ICD-10-CM | POA: Diagnosis not present

## 2014-02-24 DIAGNOSIS — R7309 Other abnormal glucose: Secondary | ICD-10-CM | POA: Diagnosis not present

## 2014-02-24 DIAGNOSIS — D539 Nutritional anemia, unspecified: Secondary | ICD-10-CM | POA: Diagnosis not present

## 2014-02-24 DIAGNOSIS — D631 Anemia in chronic kidney disease: Secondary | ICD-10-CM | POA: Diagnosis not present

## 2014-02-24 DIAGNOSIS — N186 End stage renal disease: Secondary | ICD-10-CM | POA: Diagnosis not present

## 2014-02-25 DIAGNOSIS — D539 Nutritional anemia, unspecified: Secondary | ICD-10-CM | POA: Diagnosis not present

## 2014-02-25 DIAGNOSIS — D509 Iron deficiency anemia, unspecified: Secondary | ICD-10-CM | POA: Diagnosis not present

## 2014-02-25 DIAGNOSIS — R7309 Other abnormal glucose: Secondary | ICD-10-CM | POA: Diagnosis not present

## 2014-02-25 DIAGNOSIS — D631 Anemia in chronic kidney disease: Secondary | ICD-10-CM | POA: Diagnosis not present

## 2014-02-25 DIAGNOSIS — N186 End stage renal disease: Secondary | ICD-10-CM | POA: Diagnosis not present

## 2014-02-25 DIAGNOSIS — Z79899 Other long term (current) drug therapy: Secondary | ICD-10-CM | POA: Diagnosis not present

## 2014-02-26 DIAGNOSIS — R7309 Other abnormal glucose: Secondary | ICD-10-CM | POA: Diagnosis not present

## 2014-02-26 DIAGNOSIS — Z79899 Other long term (current) drug therapy: Secondary | ICD-10-CM | POA: Diagnosis not present

## 2014-02-26 DIAGNOSIS — D631 Anemia in chronic kidney disease: Secondary | ICD-10-CM | POA: Diagnosis not present

## 2014-02-26 DIAGNOSIS — D539 Nutritional anemia, unspecified: Secondary | ICD-10-CM | POA: Diagnosis not present

## 2014-02-26 DIAGNOSIS — N186 End stage renal disease: Secondary | ICD-10-CM | POA: Diagnosis not present

## 2014-02-26 DIAGNOSIS — D509 Iron deficiency anemia, unspecified: Secondary | ICD-10-CM | POA: Diagnosis not present

## 2014-02-27 DIAGNOSIS — D631 Anemia in chronic kidney disease: Secondary | ICD-10-CM | POA: Diagnosis not present

## 2014-02-27 DIAGNOSIS — D539 Nutritional anemia, unspecified: Secondary | ICD-10-CM | POA: Diagnosis not present

## 2014-02-27 DIAGNOSIS — D509 Iron deficiency anemia, unspecified: Secondary | ICD-10-CM | POA: Diagnosis not present

## 2014-02-27 DIAGNOSIS — N039 Chronic nephritic syndrome with unspecified morphologic changes: Secondary | ICD-10-CM | POA: Diagnosis not present

## 2014-02-27 DIAGNOSIS — Z79899 Other long term (current) drug therapy: Secondary | ICD-10-CM | POA: Diagnosis not present

## 2014-02-27 DIAGNOSIS — R7309 Other abnormal glucose: Secondary | ICD-10-CM | POA: Diagnosis not present

## 2014-02-27 DIAGNOSIS — N186 End stage renal disease: Secondary | ICD-10-CM | POA: Diagnosis not present

## 2014-02-28 DIAGNOSIS — R7309 Other abnormal glucose: Secondary | ICD-10-CM | POA: Diagnosis not present

## 2014-02-28 DIAGNOSIS — D509 Iron deficiency anemia, unspecified: Secondary | ICD-10-CM | POA: Diagnosis not present

## 2014-02-28 DIAGNOSIS — D631 Anemia in chronic kidney disease: Secondary | ICD-10-CM | POA: Diagnosis not present

## 2014-02-28 DIAGNOSIS — Z79899 Other long term (current) drug therapy: Secondary | ICD-10-CM | POA: Diagnosis not present

## 2014-02-28 DIAGNOSIS — N039 Chronic nephritic syndrome with unspecified morphologic changes: Secondary | ICD-10-CM | POA: Diagnosis not present

## 2014-02-28 DIAGNOSIS — N186 End stage renal disease: Secondary | ICD-10-CM | POA: Diagnosis not present

## 2014-02-28 DIAGNOSIS — D539 Nutritional anemia, unspecified: Secondary | ICD-10-CM | POA: Diagnosis not present

## 2014-03-01 DIAGNOSIS — R7309 Other abnormal glucose: Secondary | ICD-10-CM | POA: Diagnosis not present

## 2014-03-01 DIAGNOSIS — D631 Anemia in chronic kidney disease: Secondary | ICD-10-CM | POA: Diagnosis not present

## 2014-03-01 DIAGNOSIS — Z79899 Other long term (current) drug therapy: Secondary | ICD-10-CM | POA: Diagnosis not present

## 2014-03-01 DIAGNOSIS — N186 End stage renal disease: Secondary | ICD-10-CM | POA: Diagnosis not present

## 2014-03-01 DIAGNOSIS — N039 Chronic nephritic syndrome with unspecified morphologic changes: Secondary | ICD-10-CM | POA: Diagnosis not present

## 2014-03-01 DIAGNOSIS — D539 Nutritional anemia, unspecified: Secondary | ICD-10-CM | POA: Diagnosis not present

## 2014-03-01 DIAGNOSIS — D509 Iron deficiency anemia, unspecified: Secondary | ICD-10-CM | POA: Diagnosis not present

## 2014-03-02 ENCOUNTER — Other Ambulatory Visit (HOSPITAL_COMMUNITY): Payer: Medicare Other

## 2014-03-02 DIAGNOSIS — D509 Iron deficiency anemia, unspecified: Secondary | ICD-10-CM | POA: Diagnosis not present

## 2014-03-02 DIAGNOSIS — Z79899 Other long term (current) drug therapy: Secondary | ICD-10-CM | POA: Diagnosis not present

## 2014-03-02 DIAGNOSIS — R7309 Other abnormal glucose: Secondary | ICD-10-CM | POA: Diagnosis not present

## 2014-03-02 DIAGNOSIS — D539 Nutritional anemia, unspecified: Secondary | ICD-10-CM | POA: Diagnosis not present

## 2014-03-02 DIAGNOSIS — D631 Anemia in chronic kidney disease: Secondary | ICD-10-CM | POA: Diagnosis not present

## 2014-03-02 DIAGNOSIS — N186 End stage renal disease: Secondary | ICD-10-CM | POA: Diagnosis not present

## 2014-03-02 DIAGNOSIS — N039 Chronic nephritic syndrome with unspecified morphologic changes: Secondary | ICD-10-CM | POA: Diagnosis not present

## 2014-03-04 DIAGNOSIS — D509 Iron deficiency anemia, unspecified: Secondary | ICD-10-CM | POA: Diagnosis not present

## 2014-03-04 DIAGNOSIS — N186 End stage renal disease: Secondary | ICD-10-CM | POA: Diagnosis not present

## 2014-03-04 DIAGNOSIS — D539 Nutritional anemia, unspecified: Secondary | ICD-10-CM | POA: Diagnosis not present

## 2014-03-04 DIAGNOSIS — Z79899 Other long term (current) drug therapy: Secondary | ICD-10-CM | POA: Diagnosis not present

## 2014-03-04 DIAGNOSIS — R7309 Other abnormal glucose: Secondary | ICD-10-CM | POA: Diagnosis not present

## 2014-03-04 DIAGNOSIS — D631 Anemia in chronic kidney disease: Secondary | ICD-10-CM | POA: Diagnosis not present

## 2014-03-05 DIAGNOSIS — N186 End stage renal disease: Secondary | ICD-10-CM | POA: Diagnosis not present

## 2014-03-05 DIAGNOSIS — Z79899 Other long term (current) drug therapy: Secondary | ICD-10-CM | POA: Diagnosis not present

## 2014-03-05 DIAGNOSIS — D509 Iron deficiency anemia, unspecified: Secondary | ICD-10-CM | POA: Diagnosis not present

## 2014-03-05 DIAGNOSIS — N039 Chronic nephritic syndrome with unspecified morphologic changes: Secondary | ICD-10-CM | POA: Diagnosis not present

## 2014-03-05 DIAGNOSIS — D631 Anemia in chronic kidney disease: Secondary | ICD-10-CM | POA: Diagnosis not present

## 2014-03-05 DIAGNOSIS — R7309 Other abnormal glucose: Secondary | ICD-10-CM | POA: Diagnosis not present

## 2014-03-05 DIAGNOSIS — D539 Nutritional anemia, unspecified: Secondary | ICD-10-CM | POA: Diagnosis not present

## 2014-03-05 DIAGNOSIS — E875 Hyperkalemia: Secondary | ICD-10-CM | POA: Diagnosis not present

## 2014-03-05 DIAGNOSIS — N2581 Secondary hyperparathyroidism of renal origin: Secondary | ICD-10-CM | POA: Diagnosis not present

## 2014-03-05 DIAGNOSIS — I10 Essential (primary) hypertension: Secondary | ICD-10-CM | POA: Diagnosis not present

## 2014-03-06 ENCOUNTER — Encounter (HOSPITAL_COMMUNITY): Payer: Self-pay | Admitting: Emergency Medicine

## 2014-03-06 ENCOUNTER — Emergency Department (HOSPITAL_COMMUNITY)
Admission: EM | Admit: 2014-03-06 | Discharge: 2014-03-06 | Discharge status: Against Medical Advice | Payer: Medicare Other | Attending: Emergency Medicine | Admitting: Emergency Medicine

## 2014-03-06 DIAGNOSIS — R000 Tachycardia, unspecified: Secondary | ICD-10-CM | POA: Diagnosis not present

## 2014-03-06 DIAGNOSIS — Y841 Kidney dialysis as the cause of abnormal reaction of the patient, or of later complication, without mention of misadventure at the time of the procedure: Secondary | ICD-10-CM | POA: Insufficient documentation

## 2014-03-06 DIAGNOSIS — N186 End stage renal disease: Secondary | ICD-10-CM | POA: Insufficient documentation

## 2014-03-06 DIAGNOSIS — D631 Anemia in chronic kidney disease: Secondary | ICD-10-CM | POA: Diagnosis not present

## 2014-03-06 DIAGNOSIS — Z862 Personal history of diseases of the blood and blood-forming organs and certain disorders involving the immune mechanism: Secondary | ICD-10-CM | POA: Diagnosis not present

## 2014-03-06 DIAGNOSIS — I120 Hypertensive chronic kidney disease with stage 5 chronic kidney disease or end stage renal disease: Secondary | ICD-10-CM | POA: Insufficient documentation

## 2014-03-06 DIAGNOSIS — Z79899 Other long term (current) drug therapy: Secondary | ICD-10-CM | POA: Insufficient documentation

## 2014-03-06 DIAGNOSIS — T82898A Other specified complication of vascular prosthetic devices, implants and grafts, initial encounter: Secondary | ICD-10-CM | POA: Diagnosis not present

## 2014-03-06 DIAGNOSIS — R7309 Other abnormal glucose: Secondary | ICD-10-CM | POA: Diagnosis not present

## 2014-03-06 DIAGNOSIS — Z8619 Personal history of other infectious and parasitic diseases: Secondary | ICD-10-CM | POA: Diagnosis not present

## 2014-03-06 DIAGNOSIS — T829XXA Unspecified complication of cardiac and vascular prosthetic device, implant and graft, initial encounter: Secondary | ICD-10-CM

## 2014-03-06 DIAGNOSIS — Z792 Long term (current) use of antibiotics: Secondary | ICD-10-CM | POA: Insufficient documentation

## 2014-03-06 DIAGNOSIS — N2581 Secondary hyperparathyroidism of renal origin: Secondary | ICD-10-CM | POA: Diagnosis not present

## 2014-03-06 DIAGNOSIS — D689 Coagulation defect, unspecified: Secondary | ICD-10-CM | POA: Diagnosis not present

## 2014-03-06 DIAGNOSIS — T82598A Other mechanical complication of other cardiac and vascular devices and implants, initial encounter: Secondary | ICD-10-CM | POA: Diagnosis not present

## 2014-03-06 DIAGNOSIS — D509 Iron deficiency anemia, unspecified: Secondary | ICD-10-CM | POA: Diagnosis not present

## 2014-03-06 DIAGNOSIS — Z7901 Long term (current) use of anticoagulants: Secondary | ICD-10-CM | POA: Insufficient documentation

## 2014-03-06 DIAGNOSIS — Z794 Long term (current) use of insulin: Secondary | ICD-10-CM | POA: Insufficient documentation

## 2014-03-06 DIAGNOSIS — I10 Essential (primary) hypertension: Secondary | ICD-10-CM | POA: Diagnosis not present

## 2014-03-06 LAB — CBC
HEMATOCRIT: 26.2 % — AB (ref 39.0–52.0)
Hemoglobin: 8.1 g/dL — ABNORMAL LOW (ref 13.0–17.0)
MCH: 35.2 pg — ABNORMAL HIGH (ref 26.0–34.0)
MCHC: 30.9 g/dL (ref 30.0–36.0)
MCV: 113.9 fL — AB (ref 78.0–100.0)
Platelets: 481 10*3/uL — ABNORMAL HIGH (ref 150–400)
RBC: 2.3 MIL/uL — ABNORMAL LOW (ref 4.22–5.81)
RDW: 18.8 % — ABNORMAL HIGH (ref 11.5–15.5)
WBC: 10 10*3/uL (ref 4.0–10.5)

## 2014-03-06 LAB — BASIC METABOLIC PANEL
BUN: 43 mg/dL — AB (ref 6–23)
CO2: 29 mEq/L (ref 19–32)
CREATININE: 9.83 mg/dL — AB (ref 0.50–1.35)
Calcium: 9.3 mg/dL (ref 8.4–10.5)
Chloride: 89 mEq/L — ABNORMAL LOW (ref 96–112)
GFR calc Af Amer: 6 mL/min — ABNORMAL LOW (ref >90)
GFR calc non Af Amer: 6 mL/min — ABNORMAL LOW (ref >90)
GLUCOSE: 150 mg/dL — AB (ref 70–99)
Potassium: 3.1 mEq/L — ABNORMAL LOW (ref 3.7–5.3)
Sodium: 136 mEq/L — ABNORMAL LOW (ref 137–147)

## 2014-03-06 LAB — PROTIME-INR
INR: 5.12 (ref 0.00–1.49)
Prothrombin Time: 45.3 seconds — ABNORMAL HIGH (ref 11.6–15.2)

## 2014-03-06 LAB — APTT: aPTT: 118 seconds — ABNORMAL HIGH (ref 24–37)

## 2014-03-06 MED ORDER — POTASSIUM CHLORIDE CRYS ER 20 MEQ PO TBCR
40.0000 meq | EXTENDED_RELEASE_TABLET | Freq: Once | ORAL | Status: AC
  Administered 2014-03-06: 40 meq via ORAL
  Filled 2014-03-06: qty 2

## 2014-03-06 MED ORDER — LIDOCAINE-EPINEPHRINE 1 %-1:100000 IJ SOLN
10.0000 mL | Freq: Once | INTRAMUSCULAR | Status: DC
  Filled 2014-03-06: qty 1

## 2014-03-06 NOTE — ED Notes (Signed)
CRITICAL VALUE ALERT  Critical value received: INR: 5.12 Date of notification:  03/07/15  Time of notification:  1138  Critical value read back:yes  Nurse who received alert: Maximiano Coss RN  MD notified (1st page):  Dr. Ethelda Chick

## 2014-03-06 NOTE — ED Provider Notes (Addendum)
CSN: [id number]     Arrival date & time 03/06/14  [id number] History   First MD Initiated Contact with Patient 03/06/14 [phone]     Chief Complaint  Patient presents with  . Vascular Access Problem     (Consider location/radiation/quality/duration/timing/severity/associated sxs/prior Treatment) HPI Patient presents with bleeding from his dialysis fistula onset approximately 7 AM today after having completed home dialysis. He is asymptomatic presently. No treatment prior to coming here. No other associated symptoms. No lightheadedness no chest pain no weakness Past Medical History  Diagnosis Date  . Diabetes mellitus     diabetic ketoacidosis  . Morbid obesity   . OSA (obstructive sleep apnea)   . Septic shock(785.52)   . Anemia   . Hypertension    Past Surgical History  Procedure Laterality Date  . Av fistula placement     No family history on file. History  Substance Use Topics  . Smoking status: Never Smoker   . Smokeless tobacco: Never Used  . Alcohol Use: Yes     Comment: Occasional wine use    Review of Systems  Constitutional: Negative.   HENT: Negative.   Respiratory: Negative.   Cardiovascular: Negative.   Gastrointestinal: Negative.   Musculoskeletal: Negative.   Skin: Negative.   Neurological: Negative.   Hematological: Bruises/bleeds easily.  Psychiatric/Behavioral: Negative.   All other systems reviewed and are negative.     Allergies  Aspirin  Home Medications   Prior to Admission medications   Medication Sig Start Date End Date Taking? Authorizing Provider  allopurinol (ZYLOPRIM) 300 MG tablet Take 300 mg by mouth 2 (two) times daily.    Historical Provider, MD  AMLODIPINE BESYLATE PO Take 5 mg by mouth daily. Call pharmacy to verify dosage    Historical Provider, MD  calcium acetate (PHOSLO) 667 MG capsule Take 2,001 mg by mouth 3 (three) times daily with meals.     Historical Provider, MD  colchicine 0.6 MG tablet Take 0.6 mg by mouth daily.     Historical Provider, MD  folic acid-vitamin b complex-vitamin c-selenium-zinc (DIALYVITE) 3 MG TABS Take 1 tablet by mouth daily.      Historical Provider, MD  insulin aspart protamine-insulin aspart (NOVOLOG 70/30) (70-30) 100 UNIT/ML injection Inject 35 Units into the skin 2 (two) times daily with a meal.     Historical Provider, MD  insulin glargine (LANTUS) 100 UNIT/ML injection Inject 70 Units into the skin at bedtime.     Historical Provider, MD  omeprazole (PRILOSEC) 20 MG capsule Take 20 mg by mouth daily.      Historical Provider, MD  tobramycin-dexamethasone [facility](TOBRADEX) ophthalmic solution Place 1 drop into the right eye 2 (two) times daily.    Historical Provider, MD  warfarin (COUMADIN) 10 MG tablet Take 10 mg by mouth 2 (two) times a week. Monday & Friday.    Historical Provider, MD  warfarin (COUMADIN) 7.5 MG tablet Take 7.5 mg by mouth as directed. All days except Monday and Friday    Historical Provider, MD   BP 99/54  Pulse 118  Temp(Src) 100 F (37.8 C) (Oral)  Resp 18  SpO2 89% Physical Exam  Nursing note and vitals reviewed. Constitutional: He appears well-developed and well-nourished.  HENT:  Head: Normocephalic and atraumatic.  Eyes: Conjunctivae are normal. Pupils are equal, round, and reactive to light.  Neck: Neck supple. No tracheal deviation present. No thyromegaly present.  Cardiovascular: Regular rhythm.   No murmur heard. Mildly tachycardic  Pulmonary/Chest: Effort normal and breath sounds  normal.  Abdominal: Soft. Bowel sounds are normal. He exhibits no distension. There is no tenderness.  Morbidly obese  Musculoskeletal: Normal range of motion. He exhibits no edema and no tenderness.  Left upper extremity with dialysis fistula with good thrill. Actively squirting blood from pinpoint lesion.  Neurological: He is alert. Coordination normal.  Skin: Skin is warm and dry. No rash noted.  Psychiatric: He has a normal mood and affect.    ED Course   Procedures (including critical care time) Labs Review Labs Reviewed  BASIC METABOLIC PANEL  PROTIME-INR  CBC  APTT    Imaging Review No results found.   EKG Interpretation None      11:50 AM patient alert appropriate Glasgow Coma Score 15. Results for orders placed during the hospital encounter of 03/06/14  BASIC METABOLIC PANEL      Result Value Ref Range   Sodium 136 (*) 137 - 147 mEq/L   Potassium 3.1 (*) 3.7 - 5.3 mEq/L   Chloride 89 (*) 96 - 112 mEq/L   CO2 29  19 - 32 mEq/L   Glucose, Bld 150 (*) 70 - 99 mg/dL   BUN 43 (*) 6 - 23 mg/dL   Creatinine, Ser 3.78 (*) 0.50 - 1.35 mg/dL   Calcium 9.3  8.4 - 58.8 mg/dL   GFR calc non Af Amer 6 (*) >90 mL/min   GFR calc Af Amer 6 (*) >90 mL/min  PROTIME-INR      Result Value Ref Range   Prothrombin Time 45.3 (*) 11.6 - 15.2 seconds   INR 5.12 (*) 0.00 - 1.49  CBC      Result Value Ref Range   WBC 10.0  4.0 - 10.5 K/uL   RBC 2.30 (*) 4.22 - 5.81 MIL/uL   Hemoglobin 8.1 (*) 13.0 - 17.0 g/dL   HCT 50.2 (*) 77.4 - 12.8 %   MCV 113.9 (*) 78.0 - 100.0 fL   MCH 35.2 (*) 26.0 - 34.0 pg   MCHC 30.9  30.0 - 36.0 g/dL   RDW 78.6 (*) 76.7 - 20.9 %   Platelets 481 (*) 150 - 400 K/uL  APTT      Result Value Ref Range   aPTT 118 (*) 24 - 37 seconds   No results found.  MDM  Bleeding was easily controlled by direct pressure with a fingertip. I attempted to obtain hemostasis using wounds seal powder and quick clot bandage, without success. Dr.Dickson consult to evaluate patient in ED Final diagnoses:  None   patient noted to be toxic on Coumadin. He remains tachycardic. And anemic. I spoke and the patient and significant other. He wished to sign out AGAINST MEDICAL ADVICE. I've explained to him risk of death. I've also spoken with Dr.Schertz who will arrange for him to followup tomorrow and explain patient had no more Coumadin until advised by nephrology service.  Pt is of sound mind to refuse admision  Dx #1 Dialysis fistula  complication #2 hypotension #3 anemia #4 hypokalemia     Doug Sou, MD 03/06/14 1203  Doug Sou, MD 03/07/14 1624

## 2014-03-06 NOTE — ED Notes (Addendum)
Pt arrived from home by PTAR. Pt has left arm fistula, unable to get bleeding to stop. Bleeding started around 0800.  Currently staff is holding pressure to site. PTAR unable to get BP, HR-122 O2sat-94%ra. Pt is on Coumadin.

## 2014-03-06 NOTE — Consult Note (Signed)
[facility] [place]  Patient name: Shane Hamilton MRN: [id number] DOB: [birth year] Sex: male  REASON FOR CONSULT: bleeding from left upper arm AV fistula  HPI: Shane Hamilton is a 48 y.o. male who dialyzes at home 5-6 times a week. He has been on dialysis for approximately 2 years. He states that his fistula has been in for approximately 2 years. He has had previous fistulogram this most recently at the outpatient [facility] approximately 2 months ago. After removing his needle during dialysis today, he had persistent bleeding from one of the cannulation sites which would not stop. Of note he is on Coumadin and his Coumadin was checked several days ago and was therapeutic. 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 In the emergency department pressure was held and topical agents were applied to these were not successful. For this reason vascular surgery was consulted.  Past Medical History  Diagnosis Date  . Diabetes mellitus     diabetic ketoacidosis  . Morbid obesity   . OSA (obstructive sleep apnea)   . Septic shock(785.52)   . Anemia   . Hypertension     No family history on file.  SOCIAL HISTORY: History  Substance Use Topics  . Smoking status: Never Smoker   . Smokeless tobacco: Never Used  . Alcohol Use: Yes     Comment: Occasional wine use    Allergies  Allergen Reactions  . Aspirin Other (See Comments)    Stomach problems    Current Facility-Administered Medications  Medication Dose Route Frequency Provider Last Rate Last Dose  . lidocaine-EPINEPHrine (XYLOCAINE W/EPI) 1 %-1:100000 (with pres) injection 10 mL  10 mL Intradermal Once Doug Sou, MD       Current Outpatient Prescriptions  Medication Sig Dispense Refill  . allopurinol (ZYLOPRIM) 300 MG tablet Take 300 mg by mouth 2 (two) times daily.      Marland Kitchen AMLODIPINE BESYLATE PO Take 5 mg by mouth daily. Call pharmacy to verify dosage      .  calcium acetate (PHOSLO) 667 MG capsule Take 2,001 mg by mouth 3 (three) times daily with meals.       . colchicine 0.6 MG tablet Take 0.6 mg by mouth daily.      . folic acid-vitamin b complex-vitamin c-selenium-zinc (DIALYVITE) 3 MG TABS Take 1 tablet by mouth daily.        . insulin aspart protamine-insulin aspart (NOVOLOG 70/30) (70-30) 100 UNIT/ML injection Inject 35 Units into the skin 2 (two) times daily with a meal.       . insulin glargine (LANTUS) 100 UNIT/ML injection Inject 70 Units into the skin at bedtime.       Marland Kitchen omeprazole (PRILOSEC) 20 MG capsule Take 20 mg by mouth daily.        Marland Kitchen tobramycin-dexamethasone (TOBRADEX) ophthalmic solution Place 1 drop into the right eye 2 (two) times daily.      Marland Kitchen warfarin (COUMADIN) 10 MG tablet Take 10 mg by mouth 2 (two) times a week. Monday & Friday.      . warfarin (COUMADIN) 7.5 MG tablet Take 7.5 mg by mouth as directed. All days except Monday and Friday        REVIEW OF SYSTEMS: this was not obtained given the urgency of the consult. PHYSICAL EXAM: Filed Vitals:   03/06/14 0941  BP: 99/54  Pulse: 118  Temp: 100 F (37.8 C)  TempSrc: Oral  Resp: 18  SpO2: 89%   There is no weight on file to calculate BMI. GENERAL: The patient is a well-nourished male, in no acute distress. The vital signs are documented above. CARDIOVASCULAR: There is a regular rate and rhythm.  PULMONARY: There is good air exchange bilaterally. ABDOMEN: Soft and non-tender with normal pitched bowel sounds.  MUSCULOSKELETAL: There are no major deformities or cyanosis. NEUROLOGIC: No focal weakness or paresthesias are detected. SKIN: There are no ulcers or rashes noted. PSYCHIATRIC: The patient has a normal affect. 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 It does have a reasonable thrill with some pulsatility. It was 1 area of bleeding.  DATA:  Lab Results  Component Value Date   WBC 10.0 03/06/2014   HGB 8.1* 03/06/2014   HCT 26.2* 03/06/2014   MCV 113.9*  03/06/2014   PLT 481* 03/06/2014   Lab Results  Component Value Date   NA 137 10/01/2013   K 4.1 10/01/2013   CL 93* 10/01/2013   CO2 26 10/01/2013   Lab Results  Component Value Date   CREATININE 14.71* 10/01/2013   Lab Results  Component Value Date   INR 3.27* 12/19/2013   INR 2.20* 10/01/2013   INR 1.70* 02/07/2010   Lab Results  Component Value Date   HGBA1C  Value: 5.0 (NOTE) The ADA recommends the following therapeutic goal for glycemic control related to Hgb A1c measurement: Goal of therapy: <6.5 Hgb A1c  Reference: American Diabetes Association: Clinical Practice Recommendations 2010, Diabetes Care, 2010, 33: (Suppl  1). 01/08/2010   MEDICAL ISSUES: Bleeding from left upper arm AV fistula. The area was prepped and draped in the emergency department and with an assistant holding pressure above and below the area of bleeding, the area was anesthetized. This was sutured with 2 3-0 nylon sutures and there was good hemostasis. Sterile dressing was applied. I've instructed him to follow up with his nephrologist is he may need to be considered for fistulogram to further evaluate his fistula.  Chuck Hinthristopher S Dickson [facility] [place] Beeper: [phone]

## 2014-03-06 NOTE — Discharge Instructions (Signed)
Dialysis Vascular Access Malfunction Do not take anymore Coumadin until further advised by your kidney doctor. The office will call you today or tomorrow. If you don't hear from them by tomorrow morning call for further advice. Tell the office stafff that Dr. Ethelda Chick spoke with Dr Arlean Hopping about your case. Return immediately or go to a different emergency department for lightheadedness or if you feel worse for any reason A vascular access is an entrance to your blood vessels that can be used for dialysis. A vascular access can be made in one of several ways:   Joining an artery to a vein under your skin to make a bigger blood vessel called a fistula.   Joining an artery to a vein under your skin using a soft tube called a graft.   Placing a thin, flexible tube (catheter) in a large vein, usually in your neck.  A vascular access may malfunction or become blocked.  WHAT CAN CAUSE YOUR VASCULAR ACCESS TO MALFUNCTION?  Infection (common).   A blood clot inside a part of the fistula, graft, or catheter. A blood clot can completely or partially block the flow of blood.   A kink in the graft or catheter.   A collection of blood (called a hematoma or bruise) next to the graft or catheter that pushes against it, blocking the flow of blood.  WHAT ARE SIGNS AND SYMPTOMS OF VASCULAR ACCESS MALFUNCTION?  There is a change in the vibration or pulse of your fistula or graft.  The vibration or pulse of your fistula or graft is gone.   There is new or unusual swelling of the area around the access.   There was an unsuccessful puncture of your access by the dialysis team.   The flow of blood through the fistula, graft, or catheter is too slow for effective dialysis.   When routine dialysis is completed and the needle is removed, bleeding lasts for too long a time.  WHAT HAPPENS IF MY VASCULAR ACCESS MALFUNCTIONS? Your health care provider may order blood work, cultures, or an X-ray test  in order to learn what may be wrong with your vascular access. The X-ray test involves the injection of a liquid into the vascular access. The liquid shows up on the X-ray and allows your health care provider to see if there is a blockage in the vascular access.  Treatment varies depending on the cause of the malfunction:   If the vascular access is infected, your health care provider may prescribe antibiotics to control the infection.   If a clot is found in the vascular access, you may need surgery to remove the clot.   If a blockage in the vascular access is due to some other cause (such as a kink in a graft), then you will likely need surgery to unblock or replace the graft.  HOME CARE INSTRUCTIONS: Follow up with your surgeon or other health care provider if you were instructed to do so. This is very important. Any delay in follow up could cause permanent dysfunction of the vascular access, which may be dangerous.  SEEK MEDICAL CARE IF:   Fever develops.   Swelling and pain around the vascular access gets worse or new pain develops.  Pain, numbness, or an unusual pale skin color develops in the hand on the side of your vascular access. SEEK IMMEDIATE MEDICAL CARE IF: Unusual bleeding develops at the location of the vascular access. MAKE SURE YOU:  Understand these instructions.  Will watch your  condition.  Will get help right away if you are not doing well or get worse. Document Released: 08/24/2006 Document Revised: 05/24/2013 Document Reviewed: 02/23/2013 [facility] Patient Information 2014 [place], [place].

## 2014-03-07 DIAGNOSIS — Z79899 Other long term (current) drug therapy: Secondary | ICD-10-CM | POA: Diagnosis not present

## 2014-03-07 DIAGNOSIS — D631 Anemia in chronic kidney disease: Secondary | ICD-10-CM | POA: Diagnosis not present

## 2014-03-07 DIAGNOSIS — N186 End stage renal disease: Secondary | ICD-10-CM | POA: Diagnosis not present

## 2014-03-07 DIAGNOSIS — D509 Iron deficiency anemia, unspecified: Secondary | ICD-10-CM | POA: Diagnosis not present

## 2014-03-07 DIAGNOSIS — R7309 Other abnormal glucose: Secondary | ICD-10-CM | POA: Diagnosis not present

## 2014-03-07 DIAGNOSIS — N2581 Secondary hyperparathyroidism of renal origin: Secondary | ICD-10-CM | POA: Diagnosis not present

## 2014-03-08 DIAGNOSIS — I4891 Unspecified atrial fibrillation: Secondary | ICD-10-CM | POA: Diagnosis not present

## 2014-03-08 DIAGNOSIS — D509 Iron deficiency anemia, unspecified: Secondary | ICD-10-CM | POA: Diagnosis not present

## 2014-03-08 DIAGNOSIS — R7309 Other abnormal glucose: Secondary | ICD-10-CM | POA: Diagnosis not present

## 2014-03-08 DIAGNOSIS — N186 End stage renal disease: Secondary | ICD-10-CM | POA: Diagnosis not present

## 2014-03-08 DIAGNOSIS — D631 Anemia in chronic kidney disease: Secondary | ICD-10-CM | POA: Diagnosis not present

## 2014-03-08 DIAGNOSIS — Z79899 Other long term (current) drug therapy: Secondary | ICD-10-CM | POA: Diagnosis not present

## 2014-03-08 DIAGNOSIS — N2581 Secondary hyperparathyroidism of renal origin: Secondary | ICD-10-CM | POA: Diagnosis not present

## 2014-03-09 DIAGNOSIS — D631 Anemia in chronic kidney disease: Secondary | ICD-10-CM | POA: Diagnosis not present

## 2014-03-09 DIAGNOSIS — D509 Iron deficiency anemia, unspecified: Secondary | ICD-10-CM | POA: Diagnosis not present

## 2014-03-09 DIAGNOSIS — Z79899 Other long term (current) drug therapy: Secondary | ICD-10-CM | POA: Diagnosis not present

## 2014-03-09 DIAGNOSIS — D689 Coagulation defect, unspecified: Secondary | ICD-10-CM | POA: Diagnosis not present

## 2014-03-09 DIAGNOSIS — N186 End stage renal disease: Secondary | ICD-10-CM | POA: Diagnosis not present

## 2014-03-09 DIAGNOSIS — R7309 Other abnormal glucose: Secondary | ICD-10-CM | POA: Diagnosis not present

## 2014-03-09 DIAGNOSIS — N2581 Secondary hyperparathyroidism of renal origin: Secondary | ICD-10-CM | POA: Diagnosis not present

## 2014-03-10 DIAGNOSIS — N2581 Secondary hyperparathyroidism of renal origin: Secondary | ICD-10-CM | POA: Diagnosis not present

## 2014-03-10 DIAGNOSIS — D631 Anemia in chronic kidney disease: Secondary | ICD-10-CM | POA: Diagnosis not present

## 2014-03-10 DIAGNOSIS — N186 End stage renal disease: Secondary | ICD-10-CM | POA: Diagnosis not present

## 2014-03-10 DIAGNOSIS — N039 Chronic nephritic syndrome with unspecified morphologic changes: Secondary | ICD-10-CM | POA: Diagnosis not present

## 2014-03-10 DIAGNOSIS — R7309 Other abnormal glucose: Secondary | ICD-10-CM | POA: Diagnosis not present

## 2014-03-10 DIAGNOSIS — Z79899 Other long term (current) drug therapy: Secondary | ICD-10-CM | POA: Diagnosis not present

## 2014-03-10 DIAGNOSIS — D509 Iron deficiency anemia, unspecified: Secondary | ICD-10-CM | POA: Diagnosis not present

## 2014-03-12 DIAGNOSIS — R7309 Other abnormal glucose: Secondary | ICD-10-CM | POA: Diagnosis not present

## 2014-03-12 DIAGNOSIS — D631 Anemia in chronic kidney disease: Secondary | ICD-10-CM | POA: Diagnosis not present

## 2014-03-12 DIAGNOSIS — N186 End stage renal disease: Secondary | ICD-10-CM | POA: Diagnosis not present

## 2014-03-12 DIAGNOSIS — D509 Iron deficiency anemia, unspecified: Secondary | ICD-10-CM | POA: Diagnosis not present

## 2014-03-12 DIAGNOSIS — N2581 Secondary hyperparathyroidism of renal origin: Secondary | ICD-10-CM | POA: Diagnosis not present

## 2014-03-12 DIAGNOSIS — D689 Coagulation defect, unspecified: Secondary | ICD-10-CM | POA: Diagnosis not present

## 2014-03-12 DIAGNOSIS — Z79899 Other long term (current) drug therapy: Secondary | ICD-10-CM | POA: Diagnosis not present

## 2014-03-13 DIAGNOSIS — D509 Iron deficiency anemia, unspecified: Secondary | ICD-10-CM | POA: Diagnosis not present

## 2014-03-13 DIAGNOSIS — Z79899 Other long term (current) drug therapy: Secondary | ICD-10-CM | POA: Diagnosis not present

## 2014-03-13 DIAGNOSIS — N2581 Secondary hyperparathyroidism of renal origin: Secondary | ICD-10-CM | POA: Diagnosis not present

## 2014-03-13 DIAGNOSIS — N186 End stage renal disease: Secondary | ICD-10-CM | POA: Diagnosis not present

## 2014-03-13 DIAGNOSIS — D631 Anemia in chronic kidney disease: Secondary | ICD-10-CM | POA: Diagnosis not present

## 2014-03-13 DIAGNOSIS — R7309 Other abnormal glucose: Secondary | ICD-10-CM | POA: Diagnosis not present

## 2014-03-14 DIAGNOSIS — R7309 Other abnormal glucose: Secondary | ICD-10-CM | POA: Diagnosis not present

## 2014-03-14 DIAGNOSIS — N186 End stage renal disease: Secondary | ICD-10-CM | POA: Diagnosis not present

## 2014-03-14 DIAGNOSIS — Z79899 Other long term (current) drug therapy: Secondary | ICD-10-CM | POA: Diagnosis not present

## 2014-03-14 DIAGNOSIS — N2581 Secondary hyperparathyroidism of renal origin: Secondary | ICD-10-CM | POA: Diagnosis not present

## 2014-03-14 DIAGNOSIS — D631 Anemia in chronic kidney disease: Secondary | ICD-10-CM | POA: Diagnosis not present

## 2014-03-14 DIAGNOSIS — D509 Iron deficiency anemia, unspecified: Secondary | ICD-10-CM | POA: Diagnosis not present

## 2014-03-14 DIAGNOSIS — N039 Chronic nephritic syndrome with unspecified morphologic changes: Secondary | ICD-10-CM | POA: Diagnosis not present

## 2014-03-15 DIAGNOSIS — Z79899 Other long term (current) drug therapy: Secondary | ICD-10-CM | POA: Diagnosis not present

## 2014-03-15 DIAGNOSIS — I4891 Unspecified atrial fibrillation: Secondary | ICD-10-CM | POA: Diagnosis not present

## 2014-03-15 DIAGNOSIS — N2581 Secondary hyperparathyroidism of renal origin: Secondary | ICD-10-CM | POA: Diagnosis not present

## 2014-03-15 DIAGNOSIS — N186 End stage renal disease: Secondary | ICD-10-CM | POA: Diagnosis not present

## 2014-03-15 DIAGNOSIS — R7309 Other abnormal glucose: Secondary | ICD-10-CM | POA: Diagnosis not present

## 2014-03-15 DIAGNOSIS — D509 Iron deficiency anemia, unspecified: Secondary | ICD-10-CM | POA: Diagnosis not present

## 2014-03-15 DIAGNOSIS — D631 Anemia in chronic kidney disease: Secondary | ICD-10-CM | POA: Diagnosis not present

## 2014-03-15 DIAGNOSIS — M109 Gout, unspecified: Secondary | ICD-10-CM | POA: Diagnosis not present

## 2014-03-16 DIAGNOSIS — R7309 Other abnormal glucose: Secondary | ICD-10-CM | POA: Diagnosis not present

## 2014-03-16 DIAGNOSIS — N039 Chronic nephritic syndrome with unspecified morphologic changes: Secondary | ICD-10-CM | POA: Diagnosis not present

## 2014-03-16 DIAGNOSIS — D631 Anemia in chronic kidney disease: Secondary | ICD-10-CM | POA: Diagnosis not present

## 2014-03-16 DIAGNOSIS — N2581 Secondary hyperparathyroidism of renal origin: Secondary | ICD-10-CM | POA: Diagnosis not present

## 2014-03-16 DIAGNOSIS — D509 Iron deficiency anemia, unspecified: Secondary | ICD-10-CM | POA: Diagnosis not present

## 2014-03-16 DIAGNOSIS — Z79899 Other long term (current) drug therapy: Secondary | ICD-10-CM | POA: Diagnosis not present

## 2014-03-16 DIAGNOSIS — N186 End stage renal disease: Secondary | ICD-10-CM | POA: Diagnosis not present

## 2014-03-17 DIAGNOSIS — Z79899 Other long term (current) drug therapy: Secondary | ICD-10-CM | POA: Diagnosis not present

## 2014-03-17 DIAGNOSIS — N186 End stage renal disease: Secondary | ICD-10-CM | POA: Diagnosis not present

## 2014-03-17 DIAGNOSIS — D509 Iron deficiency anemia, unspecified: Secondary | ICD-10-CM | POA: Diagnosis not present

## 2014-03-17 DIAGNOSIS — N2581 Secondary hyperparathyroidism of renal origin: Secondary | ICD-10-CM | POA: Diagnosis not present

## 2014-03-17 DIAGNOSIS — D631 Anemia in chronic kidney disease: Secondary | ICD-10-CM | POA: Diagnosis not present

## 2014-03-17 DIAGNOSIS — R7309 Other abnormal glucose: Secondary | ICD-10-CM | POA: Diagnosis not present

## 2014-03-19 DIAGNOSIS — N039 Chronic nephritic syndrome with unspecified morphologic changes: Secondary | ICD-10-CM | POA: Diagnosis not present

## 2014-03-19 DIAGNOSIS — R7309 Other abnormal glucose: Secondary | ICD-10-CM | POA: Diagnosis not present

## 2014-03-19 DIAGNOSIS — N186 End stage renal disease: Secondary | ICD-10-CM | POA: Diagnosis not present

## 2014-03-19 DIAGNOSIS — D631 Anemia in chronic kidney disease: Secondary | ICD-10-CM | POA: Diagnosis not present

## 2014-03-19 DIAGNOSIS — D509 Iron deficiency anemia, unspecified: Secondary | ICD-10-CM | POA: Diagnosis not present

## 2014-03-19 DIAGNOSIS — Z79899 Other long term (current) drug therapy: Secondary | ICD-10-CM | POA: Diagnosis not present

## 2014-03-19 DIAGNOSIS — N2581 Secondary hyperparathyroidism of renal origin: Secondary | ICD-10-CM | POA: Diagnosis not present

## 2014-03-20 ENCOUNTER — Other Ambulatory Visit (HOSPITAL_COMMUNITY): Payer: Medicare Other

## 2014-03-20 DIAGNOSIS — D631 Anemia in chronic kidney disease: Secondary | ICD-10-CM | POA: Diagnosis not present

## 2014-03-20 DIAGNOSIS — N2581 Secondary hyperparathyroidism of renal origin: Secondary | ICD-10-CM | POA: Diagnosis not present

## 2014-03-20 DIAGNOSIS — Z79899 Other long term (current) drug therapy: Secondary | ICD-10-CM | POA: Diagnosis not present

## 2014-03-20 DIAGNOSIS — R7309 Other abnormal glucose: Secondary | ICD-10-CM | POA: Diagnosis not present

## 2014-03-20 DIAGNOSIS — D509 Iron deficiency anemia, unspecified: Secondary | ICD-10-CM | POA: Diagnosis not present

## 2014-03-20 DIAGNOSIS — N186 End stage renal disease: Secondary | ICD-10-CM | POA: Diagnosis not present

## 2014-03-21 DIAGNOSIS — N2581 Secondary hyperparathyroidism of renal origin: Secondary | ICD-10-CM | POA: Diagnosis not present

## 2014-03-21 DIAGNOSIS — N186 End stage renal disease: Secondary | ICD-10-CM | POA: Diagnosis not present

## 2014-03-21 DIAGNOSIS — D631 Anemia in chronic kidney disease: Secondary | ICD-10-CM | POA: Diagnosis not present

## 2014-03-21 DIAGNOSIS — D509 Iron deficiency anemia, unspecified: Secondary | ICD-10-CM | POA: Diagnosis not present

## 2014-03-21 DIAGNOSIS — Z79899 Other long term (current) drug therapy: Secondary | ICD-10-CM | POA: Diagnosis not present

## 2014-03-21 DIAGNOSIS — R7309 Other abnormal glucose: Secondary | ICD-10-CM | POA: Diagnosis not present

## 2014-03-22 DIAGNOSIS — D631 Anemia in chronic kidney disease: Secondary | ICD-10-CM | POA: Diagnosis not present

## 2014-03-22 DIAGNOSIS — Z79899 Other long term (current) drug therapy: Secondary | ICD-10-CM | POA: Diagnosis not present

## 2014-03-22 DIAGNOSIS — N186 End stage renal disease: Secondary | ICD-10-CM | POA: Diagnosis not present

## 2014-03-22 DIAGNOSIS — D509 Iron deficiency anemia, unspecified: Secondary | ICD-10-CM | POA: Diagnosis not present

## 2014-03-22 DIAGNOSIS — N2581 Secondary hyperparathyroidism of renal origin: Secondary | ICD-10-CM | POA: Diagnosis not present

## 2014-03-22 DIAGNOSIS — R7309 Other abnormal glucose: Secondary | ICD-10-CM | POA: Diagnosis not present

## 2014-03-23 DIAGNOSIS — Z79899 Other long term (current) drug therapy: Secondary | ICD-10-CM | POA: Diagnosis not present

## 2014-03-23 DIAGNOSIS — N2581 Secondary hyperparathyroidism of renal origin: Secondary | ICD-10-CM | POA: Diagnosis not present

## 2014-03-23 DIAGNOSIS — R7309 Other abnormal glucose: Secondary | ICD-10-CM | POA: Diagnosis not present

## 2014-03-23 DIAGNOSIS — N186 End stage renal disease: Secondary | ICD-10-CM | POA: Diagnosis not present

## 2014-03-23 DIAGNOSIS — D631 Anemia in chronic kidney disease: Secondary | ICD-10-CM | POA: Diagnosis not present

## 2014-03-23 DIAGNOSIS — D509 Iron deficiency anemia, unspecified: Secondary | ICD-10-CM | POA: Diagnosis not present

## 2014-03-24 DIAGNOSIS — N2581 Secondary hyperparathyroidism of renal origin: Secondary | ICD-10-CM | POA: Diagnosis not present

## 2014-03-24 DIAGNOSIS — Z79899 Other long term (current) drug therapy: Secondary | ICD-10-CM | POA: Diagnosis not present

## 2014-03-24 DIAGNOSIS — R7309 Other abnormal glucose: Secondary | ICD-10-CM | POA: Diagnosis not present

## 2014-03-24 DIAGNOSIS — N186 End stage renal disease: Secondary | ICD-10-CM | POA: Diagnosis not present

## 2014-03-24 DIAGNOSIS — D631 Anemia in chronic kidney disease: Secondary | ICD-10-CM | POA: Diagnosis not present

## 2014-03-24 DIAGNOSIS — D509 Iron deficiency anemia, unspecified: Secondary | ICD-10-CM | POA: Diagnosis not present

## 2014-03-26 ENCOUNTER — Encounter (HOSPITAL_COMMUNITY): Payer: Self-pay | Admitting: Cardiology

## 2014-03-26 DIAGNOSIS — R7309 Other abnormal glucose: Secondary | ICD-10-CM | POA: Diagnosis not present

## 2014-03-26 DIAGNOSIS — D509 Iron deficiency anemia, unspecified: Secondary | ICD-10-CM | POA: Diagnosis not present

## 2014-03-26 DIAGNOSIS — Z79899 Other long term (current) drug therapy: Secondary | ICD-10-CM | POA: Diagnosis not present

## 2014-03-26 DIAGNOSIS — N186 End stage renal disease: Secondary | ICD-10-CM | POA: Diagnosis not present

## 2014-03-26 DIAGNOSIS — N2581 Secondary hyperparathyroidism of renal origin: Secondary | ICD-10-CM | POA: Diagnosis not present

## 2014-03-26 DIAGNOSIS — N039 Chronic nephritic syndrome with unspecified morphologic changes: Secondary | ICD-10-CM | POA: Diagnosis not present

## 2014-03-26 DIAGNOSIS — D631 Anemia in chronic kidney disease: Secondary | ICD-10-CM | POA: Diagnosis not present

## 2014-03-27 DIAGNOSIS — D509 Iron deficiency anemia, unspecified: Secondary | ICD-10-CM | POA: Diagnosis not present

## 2014-03-27 DIAGNOSIS — N039 Chronic nephritic syndrome with unspecified morphologic changes: Secondary | ICD-10-CM | POA: Diagnosis not present

## 2014-03-27 DIAGNOSIS — Z79899 Other long term (current) drug therapy: Secondary | ICD-10-CM | POA: Diagnosis not present

## 2014-03-27 DIAGNOSIS — N2581 Secondary hyperparathyroidism of renal origin: Secondary | ICD-10-CM | POA: Diagnosis not present

## 2014-03-27 DIAGNOSIS — R7309 Other abnormal glucose: Secondary | ICD-10-CM | POA: Diagnosis not present

## 2014-03-27 DIAGNOSIS — D631 Anemia in chronic kidney disease: Secondary | ICD-10-CM | POA: Diagnosis not present

## 2014-03-27 DIAGNOSIS — N186 End stage renal disease: Secondary | ICD-10-CM | POA: Diagnosis not present

## 2014-03-28 DIAGNOSIS — N2581 Secondary hyperparathyroidism of renal origin: Secondary | ICD-10-CM | POA: Diagnosis not present

## 2014-03-28 DIAGNOSIS — D509 Iron deficiency anemia, unspecified: Secondary | ICD-10-CM | POA: Diagnosis not present

## 2014-03-28 DIAGNOSIS — Z79899 Other long term (current) drug therapy: Secondary | ICD-10-CM | POA: Diagnosis not present

## 2014-03-28 DIAGNOSIS — D631 Anemia in chronic kidney disease: Secondary | ICD-10-CM | POA: Diagnosis not present

## 2014-03-28 DIAGNOSIS — R7309 Other abnormal glucose: Secondary | ICD-10-CM | POA: Diagnosis not present

## 2014-03-28 DIAGNOSIS — N186 End stage renal disease: Secondary | ICD-10-CM | POA: Diagnosis not present

## 2014-03-29 DIAGNOSIS — N186 End stage renal disease: Secondary | ICD-10-CM | POA: Diagnosis not present

## 2014-03-29 DIAGNOSIS — D509 Iron deficiency anemia, unspecified: Secondary | ICD-10-CM | POA: Diagnosis not present

## 2014-03-29 DIAGNOSIS — Z79899 Other long term (current) drug therapy: Secondary | ICD-10-CM | POA: Diagnosis not present

## 2014-03-29 DIAGNOSIS — R7309 Other abnormal glucose: Secondary | ICD-10-CM | POA: Diagnosis not present

## 2014-03-29 DIAGNOSIS — N2581 Secondary hyperparathyroidism of renal origin: Secondary | ICD-10-CM | POA: Diagnosis not present

## 2014-03-29 DIAGNOSIS — D631 Anemia in chronic kidney disease: Secondary | ICD-10-CM | POA: Diagnosis not present

## 2014-03-29 DIAGNOSIS — N039 Chronic nephritic syndrome with unspecified morphologic changes: Secondary | ICD-10-CM | POA: Diagnosis not present

## 2014-03-30 DIAGNOSIS — I82509 Chronic embolism and thrombosis of unspecified deep veins of unspecified lower extremity: Secondary | ICD-10-CM | POA: Diagnosis not present

## 2014-03-30 DIAGNOSIS — D509 Iron deficiency anemia, unspecified: Secondary | ICD-10-CM | POA: Diagnosis not present

## 2014-03-30 DIAGNOSIS — Z79899 Other long term (current) drug therapy: Secondary | ICD-10-CM | POA: Diagnosis not present

## 2014-03-30 DIAGNOSIS — D631 Anemia in chronic kidney disease: Secondary | ICD-10-CM | POA: Diagnosis not present

## 2014-03-30 DIAGNOSIS — R7309 Other abnormal glucose: Secondary | ICD-10-CM | POA: Diagnosis not present

## 2014-03-30 DIAGNOSIS — N186 End stage renal disease: Secondary | ICD-10-CM | POA: Diagnosis not present

## 2014-03-30 DIAGNOSIS — N2581 Secondary hyperparathyroidism of renal origin: Secondary | ICD-10-CM | POA: Diagnosis not present

## 2014-03-31 DIAGNOSIS — N186 End stage renal disease: Secondary | ICD-10-CM | POA: Diagnosis not present

## 2014-03-31 DIAGNOSIS — D509 Iron deficiency anemia, unspecified: Secondary | ICD-10-CM | POA: Diagnosis not present

## 2014-03-31 DIAGNOSIS — R7309 Other abnormal glucose: Secondary | ICD-10-CM | POA: Diagnosis not present

## 2014-03-31 DIAGNOSIS — Z79899 Other long term (current) drug therapy: Secondary | ICD-10-CM | POA: Diagnosis not present

## 2014-03-31 DIAGNOSIS — D631 Anemia in chronic kidney disease: Secondary | ICD-10-CM | POA: Diagnosis not present

## 2014-03-31 DIAGNOSIS — N2581 Secondary hyperparathyroidism of renal origin: Secondary | ICD-10-CM | POA: Diagnosis not present

## 2014-04-02 DIAGNOSIS — N186 End stage renal disease: Secondary | ICD-10-CM | POA: Diagnosis not present

## 2014-04-02 DIAGNOSIS — Z79899 Other long term (current) drug therapy: Secondary | ICD-10-CM | POA: Diagnosis not present

## 2014-04-02 DIAGNOSIS — D631 Anemia in chronic kidney disease: Secondary | ICD-10-CM | POA: Diagnosis not present

## 2014-04-02 DIAGNOSIS — R7309 Other abnormal glucose: Secondary | ICD-10-CM | POA: Diagnosis not present

## 2014-04-02 DIAGNOSIS — D509 Iron deficiency anemia, unspecified: Secondary | ICD-10-CM | POA: Diagnosis not present

## 2014-04-02 DIAGNOSIS — N2581 Secondary hyperparathyroidism of renal origin: Secondary | ICD-10-CM | POA: Diagnosis not present

## 2014-04-02 DIAGNOSIS — N039 Chronic nephritic syndrome with unspecified morphologic changes: Secondary | ICD-10-CM | POA: Diagnosis not present

## 2014-04-03 DIAGNOSIS — N2581 Secondary hyperparathyroidism of renal origin: Secondary | ICD-10-CM | POA: Diagnosis not present

## 2014-04-03 DIAGNOSIS — Z79899 Other long term (current) drug therapy: Secondary | ICD-10-CM | POA: Diagnosis not present

## 2014-04-03 DIAGNOSIS — N186 End stage renal disease: Secondary | ICD-10-CM | POA: Diagnosis not present

## 2014-04-03 DIAGNOSIS — N039 Chronic nephritic syndrome with unspecified morphologic changes: Secondary | ICD-10-CM | POA: Diagnosis not present

## 2014-04-03 DIAGNOSIS — R7309 Other abnormal glucose: Secondary | ICD-10-CM | POA: Diagnosis not present

## 2014-04-03 DIAGNOSIS — D509 Iron deficiency anemia, unspecified: Secondary | ICD-10-CM | POA: Diagnosis not present

## 2014-04-03 DIAGNOSIS — D631 Anemia in chronic kidney disease: Secondary | ICD-10-CM | POA: Diagnosis not present

## 2014-04-04 DIAGNOSIS — N186 End stage renal disease: Secondary | ICD-10-CM | POA: Diagnosis not present

## 2014-04-04 DIAGNOSIS — E1129 Type 2 diabetes mellitus with other diabetic kidney complication: Secondary | ICD-10-CM | POA: Diagnosis not present

## 2014-04-04 DIAGNOSIS — D539 Nutritional anemia, unspecified: Secondary | ICD-10-CM | POA: Diagnosis not present

## 2014-04-04 DIAGNOSIS — T82898A Other specified complication of vascular prosthetic devices, implants and grafts, initial encounter: Secondary | ICD-10-CM | POA: Diagnosis not present

## 2014-04-04 DIAGNOSIS — I10 Essential (primary) hypertension: Secondary | ICD-10-CM | POA: Diagnosis not present

## 2014-04-04 DIAGNOSIS — E875 Hyperkalemia: Secondary | ICD-10-CM | POA: Diagnosis not present

## 2014-04-04 DIAGNOSIS — R7309 Other abnormal glucose: Secondary | ICD-10-CM | POA: Diagnosis not present

## 2014-04-04 DIAGNOSIS — N2581 Secondary hyperparathyroidism of renal origin: Secondary | ICD-10-CM | POA: Diagnosis not present

## 2014-04-04 DIAGNOSIS — D509 Iron deficiency anemia, unspecified: Secondary | ICD-10-CM | POA: Diagnosis not present

## 2014-04-04 DIAGNOSIS — D631 Anemia in chronic kidney disease: Secondary | ICD-10-CM | POA: Diagnosis not present

## 2014-04-05 DIAGNOSIS — D509 Iron deficiency anemia, unspecified: Secondary | ICD-10-CM | POA: Diagnosis not present

## 2014-04-05 DIAGNOSIS — N039 Chronic nephritic syndrome with unspecified morphologic changes: Secondary | ICD-10-CM | POA: Diagnosis not present

## 2014-04-05 DIAGNOSIS — E1129 Type 2 diabetes mellitus with other diabetic kidney complication: Secondary | ICD-10-CM | POA: Diagnosis not present

## 2014-04-05 DIAGNOSIS — D631 Anemia in chronic kidney disease: Secondary | ICD-10-CM | POA: Diagnosis not present

## 2014-04-05 DIAGNOSIS — N186 End stage renal disease: Secondary | ICD-10-CM | POA: Diagnosis not present

## 2014-04-05 DIAGNOSIS — T82898A Other specified complication of vascular prosthetic devices, implants and grafts, initial encounter: Secondary | ICD-10-CM | POA: Diagnosis not present

## 2014-04-05 DIAGNOSIS — N2581 Secondary hyperparathyroidism of renal origin: Secondary | ICD-10-CM | POA: Diagnosis not present

## 2014-04-06 DIAGNOSIS — N186 End stage renal disease: Secondary | ICD-10-CM | POA: Diagnosis not present

## 2014-04-06 DIAGNOSIS — E1129 Type 2 diabetes mellitus with other diabetic kidney complication: Secondary | ICD-10-CM | POA: Diagnosis not present

## 2014-04-06 DIAGNOSIS — N2581 Secondary hyperparathyroidism of renal origin: Secondary | ICD-10-CM | POA: Diagnosis not present

## 2014-04-06 DIAGNOSIS — N039 Chronic nephritic syndrome with unspecified morphologic changes: Secondary | ICD-10-CM | POA: Diagnosis not present

## 2014-04-06 DIAGNOSIS — D509 Iron deficiency anemia, unspecified: Secondary | ICD-10-CM | POA: Diagnosis not present

## 2014-04-06 DIAGNOSIS — D631 Anemia in chronic kidney disease: Secondary | ICD-10-CM | POA: Diagnosis not present

## 2014-04-06 DIAGNOSIS — T82898A Other specified complication of vascular prosthetic devices, implants and grafts, initial encounter: Secondary | ICD-10-CM | POA: Diagnosis not present

## 2014-04-07 DIAGNOSIS — E1129 Type 2 diabetes mellitus with other diabetic kidney complication: Secondary | ICD-10-CM | POA: Diagnosis not present

## 2014-04-07 DIAGNOSIS — D631 Anemia in chronic kidney disease: Secondary | ICD-10-CM | POA: Diagnosis not present

## 2014-04-07 DIAGNOSIS — N2581 Secondary hyperparathyroidism of renal origin: Secondary | ICD-10-CM | POA: Diagnosis not present

## 2014-04-07 DIAGNOSIS — D509 Iron deficiency anemia, unspecified: Secondary | ICD-10-CM | POA: Diagnosis not present

## 2014-04-07 DIAGNOSIS — N186 End stage renal disease: Secondary | ICD-10-CM | POA: Diagnosis not present

## 2014-04-07 DIAGNOSIS — T82898A Other specified complication of vascular prosthetic devices, implants and grafts, initial encounter: Secondary | ICD-10-CM | POA: Diagnosis not present

## 2014-04-09 DIAGNOSIS — D631 Anemia in chronic kidney disease: Secondary | ICD-10-CM | POA: Diagnosis not present

## 2014-04-09 DIAGNOSIS — N186 End stage renal disease: Secondary | ICD-10-CM | POA: Diagnosis not present

## 2014-04-09 DIAGNOSIS — E1129 Type 2 diabetes mellitus with other diabetic kidney complication: Secondary | ICD-10-CM | POA: Diagnosis not present

## 2014-04-09 DIAGNOSIS — N2581 Secondary hyperparathyroidism of renal origin: Secondary | ICD-10-CM | POA: Diagnosis not present

## 2014-04-09 DIAGNOSIS — D509 Iron deficiency anemia, unspecified: Secondary | ICD-10-CM | POA: Diagnosis not present

## 2014-04-09 DIAGNOSIS — T82898A Other specified complication of vascular prosthetic devices, implants and grafts, initial encounter: Secondary | ICD-10-CM | POA: Diagnosis not present

## 2014-04-10 DIAGNOSIS — N2581 Secondary hyperparathyroidism of renal origin: Secondary | ICD-10-CM | POA: Diagnosis not present

## 2014-04-10 DIAGNOSIS — M109 Gout, unspecified: Secondary | ICD-10-CM | POA: Diagnosis not present

## 2014-04-10 DIAGNOSIS — E119 Type 2 diabetes mellitus without complications: Secondary | ICD-10-CM | POA: Diagnosis not present

## 2014-04-10 DIAGNOSIS — N039 Chronic nephritic syndrome with unspecified morphologic changes: Secondary | ICD-10-CM | POA: Diagnosis not present

## 2014-04-10 DIAGNOSIS — E785 Hyperlipidemia, unspecified: Secondary | ICD-10-CM | POA: Diagnosis not present

## 2014-04-10 DIAGNOSIS — D509 Iron deficiency anemia, unspecified: Secondary | ICD-10-CM | POA: Diagnosis not present

## 2014-04-10 DIAGNOSIS — N186 End stage renal disease: Secondary | ICD-10-CM | POA: Diagnosis not present

## 2014-04-10 DIAGNOSIS — E1129 Type 2 diabetes mellitus with other diabetic kidney complication: Secondary | ICD-10-CM | POA: Diagnosis not present

## 2014-04-10 DIAGNOSIS — D689 Coagulation defect, unspecified: Secondary | ICD-10-CM | POA: Diagnosis not present

## 2014-04-10 DIAGNOSIS — T82898A Other specified complication of vascular prosthetic devices, implants and grafts, initial encounter: Secondary | ICD-10-CM | POA: Diagnosis not present

## 2014-04-10 DIAGNOSIS — D631 Anemia in chronic kidney disease: Secondary | ICD-10-CM | POA: Diagnosis not present

## 2014-04-11 DIAGNOSIS — D509 Iron deficiency anemia, unspecified: Secondary | ICD-10-CM | POA: Diagnosis not present

## 2014-04-11 DIAGNOSIS — D631 Anemia in chronic kidney disease: Secondary | ICD-10-CM | POA: Diagnosis not present

## 2014-04-11 DIAGNOSIS — T82898A Other specified complication of vascular prosthetic devices, implants and grafts, initial encounter: Secondary | ICD-10-CM | POA: Diagnosis not present

## 2014-04-11 DIAGNOSIS — N2581 Secondary hyperparathyroidism of renal origin: Secondary | ICD-10-CM | POA: Diagnosis not present

## 2014-04-11 DIAGNOSIS — E1129 Type 2 diabetes mellitus with other diabetic kidney complication: Secondary | ICD-10-CM | POA: Diagnosis not present

## 2014-04-11 DIAGNOSIS — N186 End stage renal disease: Secondary | ICD-10-CM | POA: Diagnosis not present

## 2014-04-11 DIAGNOSIS — N039 Chronic nephritic syndrome with unspecified morphologic changes: Secondary | ICD-10-CM | POA: Diagnosis not present

## 2014-04-12 DIAGNOSIS — N2581 Secondary hyperparathyroidism of renal origin: Secondary | ICD-10-CM | POA: Diagnosis not present

## 2014-04-12 DIAGNOSIS — N186 End stage renal disease: Secondary | ICD-10-CM | POA: Diagnosis not present

## 2014-04-12 DIAGNOSIS — D631 Anemia in chronic kidney disease: Secondary | ICD-10-CM | POA: Diagnosis not present

## 2014-04-12 DIAGNOSIS — D509 Iron deficiency anemia, unspecified: Secondary | ICD-10-CM | POA: Diagnosis not present

## 2014-04-12 DIAGNOSIS — E1129 Type 2 diabetes mellitus with other diabetic kidney complication: Secondary | ICD-10-CM | POA: Diagnosis not present

## 2014-04-12 DIAGNOSIS — T82898A Other specified complication of vascular prosthetic devices, implants and grafts, initial encounter: Secondary | ICD-10-CM | POA: Diagnosis not present

## 2014-04-13 DIAGNOSIS — D631 Anemia in chronic kidney disease: Secondary | ICD-10-CM | POA: Diagnosis not present

## 2014-04-13 DIAGNOSIS — D509 Iron deficiency anemia, unspecified: Secondary | ICD-10-CM | POA: Diagnosis not present

## 2014-04-13 DIAGNOSIS — E1129 Type 2 diabetes mellitus with other diabetic kidney complication: Secondary | ICD-10-CM | POA: Diagnosis not present

## 2014-04-13 DIAGNOSIS — N186 End stage renal disease: Secondary | ICD-10-CM | POA: Diagnosis not present

## 2014-04-13 DIAGNOSIS — N2581 Secondary hyperparathyroidism of renal origin: Secondary | ICD-10-CM | POA: Diagnosis not present

## 2014-04-13 DIAGNOSIS — T82898A Other specified complication of vascular prosthetic devices, implants and grafts, initial encounter: Secondary | ICD-10-CM | POA: Diagnosis not present

## 2014-04-14 DIAGNOSIS — T82898A Other specified complication of vascular prosthetic devices, implants and grafts, initial encounter: Secondary | ICD-10-CM | POA: Diagnosis not present

## 2014-04-14 DIAGNOSIS — D509 Iron deficiency anemia, unspecified: Secondary | ICD-10-CM | POA: Diagnosis not present

## 2014-04-14 DIAGNOSIS — E1129 Type 2 diabetes mellitus with other diabetic kidney complication: Secondary | ICD-10-CM | POA: Diagnosis not present

## 2014-04-14 DIAGNOSIS — D631 Anemia in chronic kidney disease: Secondary | ICD-10-CM | POA: Diagnosis not present

## 2014-04-14 DIAGNOSIS — N186 End stage renal disease: Secondary | ICD-10-CM | POA: Diagnosis not present

## 2014-04-14 DIAGNOSIS — N2581 Secondary hyperparathyroidism of renal origin: Secondary | ICD-10-CM | POA: Diagnosis not present

## 2014-04-16 DIAGNOSIS — N186 End stage renal disease: Secondary | ICD-10-CM | POA: Diagnosis not present

## 2014-04-16 DIAGNOSIS — D509 Iron deficiency anemia, unspecified: Secondary | ICD-10-CM | POA: Diagnosis not present

## 2014-04-16 DIAGNOSIS — N2581 Secondary hyperparathyroidism of renal origin: Secondary | ICD-10-CM | POA: Diagnosis not present

## 2014-04-16 DIAGNOSIS — D631 Anemia in chronic kidney disease: Secondary | ICD-10-CM | POA: Diagnosis not present

## 2014-04-16 DIAGNOSIS — N039 Chronic nephritic syndrome with unspecified morphologic changes: Secondary | ICD-10-CM | POA: Diagnosis not present

## 2014-04-16 DIAGNOSIS — E1129 Type 2 diabetes mellitus with other diabetic kidney complication: Secondary | ICD-10-CM | POA: Diagnosis not present

## 2014-04-16 DIAGNOSIS — T82898A Other specified complication of vascular prosthetic devices, implants and grafts, initial encounter: Secondary | ICD-10-CM | POA: Diagnosis not present

## 2014-04-17 DIAGNOSIS — N2581 Secondary hyperparathyroidism of renal origin: Secondary | ICD-10-CM | POA: Diagnosis not present

## 2014-04-17 DIAGNOSIS — E1129 Type 2 diabetes mellitus with other diabetic kidney complication: Secondary | ICD-10-CM | POA: Diagnosis not present

## 2014-04-17 DIAGNOSIS — T82898A Other specified complication of vascular prosthetic devices, implants and grafts, initial encounter: Secondary | ICD-10-CM | POA: Diagnosis not present

## 2014-04-17 DIAGNOSIS — D631 Anemia in chronic kidney disease: Secondary | ICD-10-CM | POA: Diagnosis not present

## 2014-04-17 DIAGNOSIS — N186 End stage renal disease: Secondary | ICD-10-CM | POA: Diagnosis not present

## 2014-04-17 DIAGNOSIS — I871 Compression of vein: Secondary | ICD-10-CM | POA: Diagnosis not present

## 2014-04-17 DIAGNOSIS — D509 Iron deficiency anemia, unspecified: Secondary | ICD-10-CM | POA: Diagnosis not present

## 2014-04-18 ENCOUNTER — Ambulatory Visit: Payer: Self-pay | Admitting: Nephrology

## 2014-04-18 DIAGNOSIS — D631 Anemia in chronic kidney disease: Secondary | ICD-10-CM | POA: Diagnosis not present

## 2014-04-18 DIAGNOSIS — N186 End stage renal disease: Secondary | ICD-10-CM | POA: Diagnosis not present

## 2014-04-18 DIAGNOSIS — N2581 Secondary hyperparathyroidism of renal origin: Secondary | ICD-10-CM | POA: Diagnosis not present

## 2014-04-18 DIAGNOSIS — D509 Iron deficiency anemia, unspecified: Secondary | ICD-10-CM | POA: Diagnosis not present

## 2014-04-18 DIAGNOSIS — T82898A Other specified complication of vascular prosthetic devices, implants and grafts, initial encounter: Secondary | ICD-10-CM | POA: Diagnosis not present

## 2014-04-18 DIAGNOSIS — E1129 Type 2 diabetes mellitus with other diabetic kidney complication: Secondary | ICD-10-CM | POA: Diagnosis not present

## 2014-04-18 DIAGNOSIS — N039 Chronic nephritic syndrome with unspecified morphologic changes: Secondary | ICD-10-CM | POA: Diagnosis not present

## 2014-04-19 DIAGNOSIS — E1129 Type 2 diabetes mellitus with other diabetic kidney complication: Secondary | ICD-10-CM | POA: Diagnosis not present

## 2014-04-19 DIAGNOSIS — D631 Anemia in chronic kidney disease: Secondary | ICD-10-CM | POA: Diagnosis not present

## 2014-04-19 DIAGNOSIS — N186 End stage renal disease: Secondary | ICD-10-CM | POA: Diagnosis not present

## 2014-04-19 DIAGNOSIS — T82898A Other specified complication of vascular prosthetic devices, implants and grafts, initial encounter: Secondary | ICD-10-CM | POA: Diagnosis not present

## 2014-04-19 DIAGNOSIS — N039 Chronic nephritic syndrome with unspecified morphologic changes: Secondary | ICD-10-CM | POA: Diagnosis not present

## 2014-04-19 DIAGNOSIS — N2581 Secondary hyperparathyroidism of renal origin: Secondary | ICD-10-CM | POA: Diagnosis not present

## 2014-04-19 DIAGNOSIS — D509 Iron deficiency anemia, unspecified: Secondary | ICD-10-CM | POA: Diagnosis not present

## 2014-04-20 DIAGNOSIS — N186 End stage renal disease: Secondary | ICD-10-CM | POA: Diagnosis not present

## 2014-04-20 DIAGNOSIS — T82898A Other specified complication of vascular prosthetic devices, implants and grafts, initial encounter: Secondary | ICD-10-CM | POA: Diagnosis not present

## 2014-04-20 DIAGNOSIS — D509 Iron deficiency anemia, unspecified: Secondary | ICD-10-CM | POA: Diagnosis not present

## 2014-04-20 DIAGNOSIS — D631 Anemia in chronic kidney disease: Secondary | ICD-10-CM | POA: Diagnosis not present

## 2014-04-20 DIAGNOSIS — N2581 Secondary hyperparathyroidism of renal origin: Secondary | ICD-10-CM | POA: Diagnosis not present

## 2014-04-20 DIAGNOSIS — E1129 Type 2 diabetes mellitus with other diabetic kidney complication: Secondary | ICD-10-CM | POA: Diagnosis not present

## 2014-04-21 DIAGNOSIS — N186 End stage renal disease: Secondary | ICD-10-CM | POA: Diagnosis not present

## 2014-04-21 DIAGNOSIS — N2581 Secondary hyperparathyroidism of renal origin: Secondary | ICD-10-CM | POA: Diagnosis not present

## 2014-04-21 DIAGNOSIS — E1129 Type 2 diabetes mellitus with other diabetic kidney complication: Secondary | ICD-10-CM | POA: Diagnosis not present

## 2014-04-21 DIAGNOSIS — D631 Anemia in chronic kidney disease: Secondary | ICD-10-CM | POA: Diagnosis not present

## 2014-04-21 DIAGNOSIS — D509 Iron deficiency anemia, unspecified: Secondary | ICD-10-CM | POA: Diagnosis not present

## 2014-04-21 DIAGNOSIS — T82898A Other specified complication of vascular prosthetic devices, implants and grafts, initial encounter: Secondary | ICD-10-CM | POA: Diagnosis not present

## 2014-04-23 DIAGNOSIS — N2581 Secondary hyperparathyroidism of renal origin: Secondary | ICD-10-CM | POA: Diagnosis not present

## 2014-04-23 DIAGNOSIS — D509 Iron deficiency anemia, unspecified: Secondary | ICD-10-CM | POA: Diagnosis not present

## 2014-04-23 DIAGNOSIS — D689 Coagulation defect, unspecified: Secondary | ICD-10-CM | POA: Diagnosis not present

## 2014-04-23 DIAGNOSIS — D631 Anemia in chronic kidney disease: Secondary | ICD-10-CM | POA: Diagnosis not present

## 2014-04-23 DIAGNOSIS — N039 Chronic nephritic syndrome with unspecified morphologic changes: Secondary | ICD-10-CM | POA: Diagnosis not present

## 2014-04-23 DIAGNOSIS — E1129 Type 2 diabetes mellitus with other diabetic kidney complication: Secondary | ICD-10-CM | POA: Diagnosis not present

## 2014-04-23 DIAGNOSIS — N186 End stage renal disease: Secondary | ICD-10-CM | POA: Diagnosis not present

## 2014-04-23 DIAGNOSIS — T82898A Other specified complication of vascular prosthetic devices, implants and grafts, initial encounter: Secondary | ICD-10-CM | POA: Diagnosis not present

## 2014-04-24 ENCOUNTER — Ambulatory Visit: Payer: Self-pay | Admitting: Vascular Surgery

## 2014-04-24 DIAGNOSIS — Z7901 Long term (current) use of anticoagulants: Secondary | ICD-10-CM | POA: Diagnosis not present

## 2014-04-24 DIAGNOSIS — Z79899 Other long term (current) drug therapy: Secondary | ICD-10-CM | POA: Diagnosis not present

## 2014-04-24 DIAGNOSIS — N039 Chronic nephritic syndrome with unspecified morphologic changes: Secondary | ICD-10-CM | POA: Diagnosis not present

## 2014-04-24 DIAGNOSIS — G4730 Sleep apnea, unspecified: Secondary | ICD-10-CM | POA: Diagnosis not present

## 2014-04-24 DIAGNOSIS — T82898A Other specified complication of vascular prosthetic devices, implants and grafts, initial encounter: Secondary | ICD-10-CM | POA: Diagnosis not present

## 2014-04-24 DIAGNOSIS — D509 Iron deficiency anemia, unspecified: Secondary | ICD-10-CM | POA: Diagnosis not present

## 2014-04-24 DIAGNOSIS — I742 Embolism and thrombosis of arteries of the upper extremities: Secondary | ICD-10-CM | POA: Diagnosis not present

## 2014-04-24 DIAGNOSIS — D631 Anemia in chronic kidney disease: Secondary | ICD-10-CM | POA: Diagnosis not present

## 2014-04-24 DIAGNOSIS — Z9981 Dependence on supplemental oxygen: Secondary | ICD-10-CM | POA: Diagnosis not present

## 2014-04-24 DIAGNOSIS — E669 Obesity, unspecified: Secondary | ICD-10-CM | POA: Diagnosis not present

## 2014-04-24 DIAGNOSIS — Z886 Allergy status to analgesic agent status: Secondary | ICD-10-CM | POA: Diagnosis not present

## 2014-04-24 DIAGNOSIS — N2581 Secondary hyperparathyroidism of renal origin: Secondary | ICD-10-CM | POA: Diagnosis not present

## 2014-04-24 DIAGNOSIS — K219 Gastro-esophageal reflux disease without esophagitis: Secondary | ICD-10-CM | POA: Diagnosis not present

## 2014-04-24 DIAGNOSIS — E119 Type 2 diabetes mellitus without complications: Secondary | ICD-10-CM | POA: Diagnosis not present

## 2014-04-24 DIAGNOSIS — E1129 Type 2 diabetes mellitus with other diabetic kidney complication: Secondary | ICD-10-CM | POA: Diagnosis not present

## 2014-04-24 DIAGNOSIS — N186 End stage renal disease: Secondary | ICD-10-CM | POA: Diagnosis not present

## 2014-04-24 LAB — CBC
HCT: 26.9 % — AB (ref 40.0–52.0)
HGB: 8.5 g/dL — ABNORMAL LOW (ref 13.0–18.0)
MCH: 35.1 pg — AB (ref 26.0–34.0)
MCHC: 31.6 g/dL — ABNORMAL LOW (ref 32.0–36.0)
MCV: 111 fL — AB (ref 80–100)
Platelet: 309 10*3/uL (ref 150–440)
RBC: 2.42 10*6/uL — ABNORMAL LOW (ref 4.40–5.90)
RDW: 19.9 % — AB (ref 11.5–14.5)
WBC: 7.3 10*3/uL (ref 3.8–10.6)

## 2014-04-24 LAB — PROTIME-INR
INR: 1.4
Prothrombin Time: 16.6 secs — ABNORMAL HIGH (ref 11.5–14.7)

## 2014-04-24 LAB — POTASSIUM: Potassium: 3.6 mmol/L (ref 3.5–5.1)

## 2014-04-26 DIAGNOSIS — D509 Iron deficiency anemia, unspecified: Secondary | ICD-10-CM | POA: Diagnosis not present

## 2014-04-26 DIAGNOSIS — E1129 Type 2 diabetes mellitus with other diabetic kidney complication: Secondary | ICD-10-CM | POA: Diagnosis not present

## 2014-04-26 DIAGNOSIS — N186 End stage renal disease: Secondary | ICD-10-CM | POA: Diagnosis not present

## 2014-04-26 DIAGNOSIS — D631 Anemia in chronic kidney disease: Secondary | ICD-10-CM | POA: Diagnosis not present

## 2014-04-26 DIAGNOSIS — I509 Heart failure, unspecified: Secondary | ICD-10-CM | POA: Diagnosis not present

## 2014-04-26 DIAGNOSIS — N2581 Secondary hyperparathyroidism of renal origin: Secondary | ICD-10-CM | POA: Diagnosis not present

## 2014-04-26 LAB — PATHOLOGY REPORT

## 2014-04-28 DIAGNOSIS — N2581 Secondary hyperparathyroidism of renal origin: Secondary | ICD-10-CM | POA: Diagnosis not present

## 2014-04-28 DIAGNOSIS — E1129 Type 2 diabetes mellitus with other diabetic kidney complication: Secondary | ICD-10-CM | POA: Diagnosis not present

## 2014-04-28 DIAGNOSIS — D631 Anemia in chronic kidney disease: Secondary | ICD-10-CM | POA: Diagnosis not present

## 2014-04-28 DIAGNOSIS — D509 Iron deficiency anemia, unspecified: Secondary | ICD-10-CM | POA: Diagnosis not present

## 2014-04-28 DIAGNOSIS — N186 End stage renal disease: Secondary | ICD-10-CM | POA: Diagnosis not present

## 2014-04-28 DIAGNOSIS — I509 Heart failure, unspecified: Secondary | ICD-10-CM | POA: Diagnosis not present

## 2014-04-30 DIAGNOSIS — N186 End stage renal disease: Secondary | ICD-10-CM | POA: Diagnosis not present

## 2014-04-30 DIAGNOSIS — D631 Anemia in chronic kidney disease: Secondary | ICD-10-CM | POA: Diagnosis not present

## 2014-04-30 DIAGNOSIS — E1129 Type 2 diabetes mellitus with other diabetic kidney complication: Secondary | ICD-10-CM | POA: Diagnosis not present

## 2014-04-30 DIAGNOSIS — N2581 Secondary hyperparathyroidism of renal origin: Secondary | ICD-10-CM | POA: Diagnosis not present

## 2014-04-30 DIAGNOSIS — I509 Heart failure, unspecified: Secondary | ICD-10-CM | POA: Diagnosis not present

## 2014-04-30 DIAGNOSIS — D509 Iron deficiency anemia, unspecified: Secondary | ICD-10-CM | POA: Diagnosis not present

## 2014-05-01 DIAGNOSIS — I509 Heart failure, unspecified: Secondary | ICD-10-CM | POA: Diagnosis not present

## 2014-05-01 DIAGNOSIS — N186 End stage renal disease: Secondary | ICD-10-CM | POA: Diagnosis not present

## 2014-05-01 DIAGNOSIS — N039 Chronic nephritic syndrome with unspecified morphologic changes: Secondary | ICD-10-CM | POA: Diagnosis not present

## 2014-05-01 DIAGNOSIS — E1129 Type 2 diabetes mellitus with other diabetic kidney complication: Secondary | ICD-10-CM | POA: Diagnosis not present

## 2014-05-01 DIAGNOSIS — D509 Iron deficiency anemia, unspecified: Secondary | ICD-10-CM | POA: Diagnosis not present

## 2014-05-01 DIAGNOSIS — N2581 Secondary hyperparathyroidism of renal origin: Secondary | ICD-10-CM | POA: Diagnosis not present

## 2014-05-01 DIAGNOSIS — D631 Anemia in chronic kidney disease: Secondary | ICD-10-CM | POA: Diagnosis not present

## 2014-05-03 DIAGNOSIS — I509 Heart failure, unspecified: Secondary | ICD-10-CM | POA: Diagnosis not present

## 2014-05-03 DIAGNOSIS — N039 Chronic nephritic syndrome with unspecified morphologic changes: Secondary | ICD-10-CM | POA: Diagnosis not present

## 2014-05-03 DIAGNOSIS — D509 Iron deficiency anemia, unspecified: Secondary | ICD-10-CM | POA: Diagnosis not present

## 2014-05-03 DIAGNOSIS — N2581 Secondary hyperparathyroidism of renal origin: Secondary | ICD-10-CM | POA: Diagnosis not present

## 2014-05-03 DIAGNOSIS — E1129 Type 2 diabetes mellitus with other diabetic kidney complication: Secondary | ICD-10-CM | POA: Diagnosis not present

## 2014-05-03 DIAGNOSIS — D631 Anemia in chronic kidney disease: Secondary | ICD-10-CM | POA: Diagnosis not present

## 2014-05-03 DIAGNOSIS — N186 End stage renal disease: Secondary | ICD-10-CM | POA: Diagnosis not present

## 2014-05-04 DIAGNOSIS — N186 End stage renal disease: Secondary | ICD-10-CM | POA: Diagnosis not present

## 2014-05-05 DIAGNOSIS — I509 Heart failure, unspecified: Secondary | ICD-10-CM | POA: Diagnosis not present

## 2014-05-05 DIAGNOSIS — E1129 Type 2 diabetes mellitus with other diabetic kidney complication: Secondary | ICD-10-CM | POA: Diagnosis not present

## 2014-05-05 DIAGNOSIS — T82898A Other specified complication of vascular prosthetic devices, implants and grafts, initial encounter: Secondary | ICD-10-CM | POA: Diagnosis not present

## 2014-05-05 DIAGNOSIS — N2581 Secondary hyperparathyroidism of renal origin: Secondary | ICD-10-CM | POA: Diagnosis not present

## 2014-05-05 DIAGNOSIS — N186 End stage renal disease: Secondary | ICD-10-CM | POA: Diagnosis not present

## 2014-05-08 DIAGNOSIS — N186 End stage renal disease: Secondary | ICD-10-CM | POA: Diagnosis not present

## 2014-05-08 DIAGNOSIS — I509 Heart failure, unspecified: Secondary | ICD-10-CM | POA: Diagnosis not present

## 2014-05-08 DIAGNOSIS — T82898A Other specified complication of vascular prosthetic devices, implants and grafts, initial encounter: Secondary | ICD-10-CM | POA: Diagnosis not present

## 2014-05-08 DIAGNOSIS — N2581 Secondary hyperparathyroidism of renal origin: Secondary | ICD-10-CM | POA: Diagnosis not present

## 2014-05-08 DIAGNOSIS — E1129 Type 2 diabetes mellitus with other diabetic kidney complication: Secondary | ICD-10-CM | POA: Diagnosis not present

## 2014-05-10 DIAGNOSIS — N186 End stage renal disease: Secondary | ICD-10-CM | POA: Diagnosis not present

## 2014-05-10 DIAGNOSIS — N2581 Secondary hyperparathyroidism of renal origin: Secondary | ICD-10-CM | POA: Diagnosis not present

## 2014-05-10 DIAGNOSIS — E8779 Other fluid overload: Secondary | ICD-10-CM | POA: Diagnosis not present

## 2014-05-10 DIAGNOSIS — N039 Chronic nephritic syndrome with unspecified morphologic changes: Secondary | ICD-10-CM | POA: Diagnosis not present

## 2014-05-10 DIAGNOSIS — D631 Anemia in chronic kidney disease: Secondary | ICD-10-CM | POA: Diagnosis not present

## 2014-05-10 DIAGNOSIS — D509 Iron deficiency anemia, unspecified: Secondary | ICD-10-CM | POA: Diagnosis not present

## 2014-05-10 DIAGNOSIS — T82898A Other specified complication of vascular prosthetic devices, implants and grafts, initial encounter: Secondary | ICD-10-CM | POA: Diagnosis not present

## 2014-05-10 DIAGNOSIS — L0291 Cutaneous abscess, unspecified: Secondary | ICD-10-CM | POA: Diagnosis not present

## 2014-05-17 DIAGNOSIS — I871 Compression of vein: Secondary | ICD-10-CM | POA: Diagnosis not present

## 2014-05-17 DIAGNOSIS — N186 End stage renal disease: Secondary | ICD-10-CM | POA: Diagnosis not present

## 2014-05-17 DIAGNOSIS — T82898A Other specified complication of vascular prosthetic devices, implants and grafts, initial encounter: Secondary | ICD-10-CM | POA: Diagnosis not present

## 2014-05-22 DIAGNOSIS — L0291 Cutaneous abscess, unspecified: Secondary | ICD-10-CM | POA: Diagnosis not present

## 2014-06-04 DIAGNOSIS — N186 End stage renal disease: Secondary | ICD-10-CM | POA: Diagnosis not present

## 2014-06-05 DIAGNOSIS — N039 Chronic nephritic syndrome with unspecified morphologic changes: Secondary | ICD-10-CM | POA: Diagnosis not present

## 2014-06-05 DIAGNOSIS — D631 Anemia in chronic kidney disease: Secondary | ICD-10-CM | POA: Diagnosis not present

## 2014-06-05 DIAGNOSIS — D509 Iron deficiency anemia, unspecified: Secondary | ICD-10-CM | POA: Diagnosis not present

## 2014-06-05 DIAGNOSIS — N186 End stage renal disease: Secondary | ICD-10-CM | POA: Diagnosis not present

## 2014-06-05 DIAGNOSIS — N2581 Secondary hyperparathyroidism of renal origin: Secondary | ICD-10-CM | POA: Diagnosis not present

## 2014-06-13 ENCOUNTER — Emergency Department (HOSPITAL_COMMUNITY)
Admission: EM | Admit: 2014-06-13 | Discharge: 2014-06-14 | Disposition: A | Discharge status: Home / Self Care | Payer: Medicare Other | Attending: Emergency Medicine | Admitting: Emergency Medicine

## 2014-06-13 ENCOUNTER — Encounter (HOSPITAL_COMMUNITY): Payer: Self-pay | Admitting: Emergency Medicine

## 2014-06-13 DIAGNOSIS — Y841 Kidney dialysis as the cause of abnormal reaction of the patient, or of later complication, without mention of misadventure at the time of the procedure: Secondary | ICD-10-CM | POA: Diagnosis not present

## 2014-06-13 DIAGNOSIS — Z794 Long term (current) use of insulin: Secondary | ICD-10-CM | POA: Diagnosis not present

## 2014-06-13 DIAGNOSIS — E119 Type 2 diabetes mellitus without complications: Secondary | ICD-10-CM | POA: Diagnosis not present

## 2014-06-13 DIAGNOSIS — I120 Hypertensive chronic kidney disease with stage 5 chronic kidney disease or end stage renal disease: Secondary | ICD-10-CM | POA: Insufficient documentation

## 2014-06-13 DIAGNOSIS — Z79899 Other long term (current) drug therapy: Secondary | ICD-10-CM | POA: Diagnosis not present

## 2014-06-13 DIAGNOSIS — N186 End stage renal disease: Secondary | ICD-10-CM | POA: Diagnosis not present

## 2014-06-13 DIAGNOSIS — Z992 Dependence on renal dialysis: Secondary | ICD-10-CM | POA: Diagnosis not present

## 2014-06-13 DIAGNOSIS — T8242XD Displacement of vascular dialysis catheter, subsequent encounter: Secondary | ICD-10-CM

## 2014-06-13 DIAGNOSIS — Z7901 Long term (current) use of anticoagulants: Secondary | ICD-10-CM | POA: Diagnosis not present

## 2014-06-13 DIAGNOSIS — T82598A Other mechanical complication of other cardiac and vascular devices and implants, initial encounter: Secondary | ICD-10-CM | POA: Insufficient documentation

## 2014-06-13 DIAGNOSIS — Z862 Personal history of diseases of the blood and blood-forming organs and certain disorders involving the immune mechanism: Secondary | ICD-10-CM | POA: Diagnosis not present

## 2014-06-13 DIAGNOSIS — I10 Essential (primary) hypertension: Secondary | ICD-10-CM | POA: Diagnosis not present

## 2014-06-13 DIAGNOSIS — Z8669 Personal history of other diseases of the nervous system and sense organs: Secondary | ICD-10-CM | POA: Diagnosis not present

## 2014-06-13 LAB — PROTIME-INR
INR: 1.71 — ABNORMAL HIGH (ref 0.00–1.49)
Prothrombin Time: 20.1 s — ABNORMAL HIGH (ref 11.6–15.2)

## 2014-06-13 NOTE — ED Provider Notes (Signed)
CSN: [id number]     Arrival date & time 06/13/14  2034 History   First MD Initiated Contact with Patient 06/13/14 2109     Chief Complaint  Patient presents with  . Enrigue Catena fell out    HPI 48 year old male with history of hemodialysis and coumadin therapy presents with complaint of permacath falling out. States that earlier today he noticed the [employer] on the floor. Does not recall any pain. Recalls similar episode one month ago, at that time the cath was replaced in the same opening and secured with sutures at [facility]. Permacath has been used for hemodialysis while AV fistula matures. Patient is scheduled for dialysis tomorrow morning. Denies any new onset SOB, chest pain, or abdominal pain. Denies any pain or bleeding at the site.  Past Medical History  Diagnosis Date  . Diabetes mellitus     diabetic ketoacidosis  . Morbid obesity   . OSA (obstructive sleep apnea)   . Septic shock(785.52)   . Anemia   . Hypertension    Past Surgical History  Procedure Laterality Date  . Av fistula placement     No family history on file. History  Substance Use Topics  . Smoking status: Never Smoker   . Smokeless tobacco: Never Used  . Alcohol Use: Yes     Comment: Occasional wine use    Review of Systems Review of systems is negative except as noted in HPI.    Allergies  Aspirin  Home Medications   Prior to Admission medications   Medication Sig Start Date End Date Taking? Authorizing Provider  allopurinol (ZYLOPRIM) 300 MG tablet Take 300 mg by mouth 2 (two) times daily.    Historical Provider, MD  AMLODIPINE BESYLATE PO Take 5 mg by mouth daily. Call pharmacy to verify dosage    Historical Provider, MD  calcium acetate (PHOSLO) 667 MG capsule Take 2,001 mg by mouth 3 (three) times daily with meals.     Historical Provider, MD  colchicine 0.6 MG tablet Take 0.6 mg by mouth daily.    Historical Provider, MD  folic acid-vitamin b complex-vitamin c-selenium-zinc  (DIALYVITE) 3 MG TABS Take 1 tablet by mouth daily.      Historical Provider, MD  insulin aspart protamine-insulin aspart (NOVOLOG 70/30) (70-30) 100 UNIT/ML injection Inject 35 Units into the skin 2 (two) times daily with a meal.     Historical Provider, MD  insulin glargine (LANTUS) 100 UNIT/ML injection Inject 70 Units into the skin at bedtime.     Historical Provider, MD  omeprazole (PRILOSEC) 20 MG capsule Take 20 mg by mouth daily.      Historical Provider, MD  tobramycin-dexamethasone [facility]) ophthalmic solution Place 1 drop into the right eye 2 (two) times daily.    Historical Provider, MD  warfarin (COUMADIN) 10 MG tablet Take 10 mg by mouth 2 (two) times a week. Monday & Friday.    Historical Provider, MD  warfarin (COUMADIN) 7.5 MG tablet Take 7.5 mg by mouth as directed. All days except Monday and Friday    Historical Provider, MD   BP 150/85  Pulse 108  Temp(Src) 98.9 F (37.2 C) (Oral)  Resp 24  Ht  (1.803 m)  Wt 436 lb 8.2 oz (198.001 kg)  BMI 60.91 kg/m2  SpO2 90% Physical Exam  Nursing note and vitals reviewed. Constitutional: He appears well-developed and well-nourished.  Cardiovascular: Normal rate, regular rhythm and normal heart sounds.  Exam reveals no gallop.   No murmur  heard. Pulmonary/Chest: He exhibits no tenderness, no edema and no swelling.      ED Course  Procedures (including critical care time) Spoke with the patients Vascular Doctor who will see him at 7:30 in the morning. Patient is stable and agrees to the plan.    Carlyle Dolly, PA-C 06/18/14 [phone]

## 2014-06-13 NOTE — ED Notes (Signed)
Patient reports catheter fell outside. Dressing in place over same. No active bleeding noted at this time. Patient denies pain at this time. Patient reports non productive cough. Denies lightheadedness/dizziness.

## 2014-06-13 NOTE — ED Notes (Signed)
Patient c/o permacath falling out today. Patient is unsure how this happened, states he felt something sharp earlier in the day and then just PTA he looked down and the permacath was laying in the floor. Patient reports this has happened before @ 1 month ago.

## 2014-06-14 DIAGNOSIS — I871 Compression of vein: Secondary | ICD-10-CM | POA: Diagnosis not present

## 2014-06-14 DIAGNOSIS — N186 End stage renal disease: Secondary | ICD-10-CM | POA: Diagnosis not present

## 2014-06-14 DIAGNOSIS — T82898A Other specified complication of vascular prosthetic devices, implants and grafts, initial encounter: Secondary | ICD-10-CM | POA: Diagnosis not present

## 2014-06-14 NOTE — Discharge Instructions (Signed)
Return here as needed. Follow up at 7:30 tomorrow morning at Dr. Truman Hayward office

## 2014-06-20 NOTE — ED Provider Notes (Signed)
Medical screening examination/treatment/procedure(s) were performed by non-physician practitioner and as supervising physician I was immediately available for consultation/collaboration.   EKG Interpretation None        Alysandra Lobue, DO 06/20/14 0751 

## 2014-07-04 DIAGNOSIS — G43909 Migraine, unspecified, not intractable, without status migrainosus: Secondary | ICD-10-CM | POA: Diagnosis not present

## 2014-07-04 DIAGNOSIS — G4733 Obstructive sleep apnea (adult) (pediatric): Secondary | ICD-10-CM | POA: Diagnosis not present

## 2014-07-04 DIAGNOSIS — N186 End stage renal disease: Secondary | ICD-10-CM | POA: Diagnosis not present

## 2014-07-05 DIAGNOSIS — N2581 Secondary hyperparathyroidism of renal origin: Secondary | ICD-10-CM | POA: Diagnosis not present

## 2014-07-05 DIAGNOSIS — D509 Iron deficiency anemia, unspecified: Secondary | ICD-10-CM | POA: Diagnosis not present

## 2014-07-05 DIAGNOSIS — N186 End stage renal disease: Secondary | ICD-10-CM | POA: Diagnosis not present

## 2014-07-05 DIAGNOSIS — D631 Anemia in chronic kidney disease: Secondary | ICD-10-CM | POA: Diagnosis not present

## 2014-07-07 DIAGNOSIS — N186 End stage renal disease: Secondary | ICD-10-CM | POA: Diagnosis not present

## 2014-07-07 DIAGNOSIS — D631 Anemia in chronic kidney disease: Secondary | ICD-10-CM | POA: Diagnosis not present

## 2014-07-07 DIAGNOSIS — N2581 Secondary hyperparathyroidism of renal origin: Secondary | ICD-10-CM | POA: Diagnosis not present

## 2014-07-07 DIAGNOSIS — D509 Iron deficiency anemia, unspecified: Secondary | ICD-10-CM | POA: Diagnosis not present

## 2014-07-09 ENCOUNTER — Ambulatory Visit: Payer: Medicare Other | Admitting: Internal Medicine

## 2014-07-09 ENCOUNTER — Telehealth: Payer: Self-pay | Admitting: Internal Medicine

## 2014-07-09 ENCOUNTER — Other Ambulatory Visit: Payer: Self-pay | Admitting: Family Medicine

## 2014-07-09 DIAGNOSIS — R519 Headache, unspecified: Secondary | ICD-10-CM

## 2014-07-09 DIAGNOSIS — R51 Headache: Principal | ICD-10-CM

## 2014-07-09 NOTE — Telephone Encounter (Signed)
Called and spoke to pt. Pt c/o migraine. Pt stated he saw his PCP regarding this on Friday 10/2. I advised the pt to call his PCP because this issue was initially addressed with pt's PCP. Pt verbalized understanding and stated he would call his PCP. Nothing further needed.

## 2014-07-10 DIAGNOSIS — N2581 Secondary hyperparathyroidism of renal origin: Secondary | ICD-10-CM | POA: Diagnosis not present

## 2014-07-10 DIAGNOSIS — D509 Iron deficiency anemia, unspecified: Secondary | ICD-10-CM | POA: Diagnosis not present

## 2014-07-10 DIAGNOSIS — N186 End stage renal disease: Secondary | ICD-10-CM | POA: Diagnosis not present

## 2014-07-10 DIAGNOSIS — D631 Anemia in chronic kidney disease: Secondary | ICD-10-CM | POA: Diagnosis not present

## 2014-07-11 ENCOUNTER — Other Ambulatory Visit: Payer: Medicare Other

## 2014-07-11 DIAGNOSIS — D509 Iron deficiency anemia, unspecified: Secondary | ICD-10-CM | POA: Diagnosis not present

## 2014-07-11 DIAGNOSIS — N2581 Secondary hyperparathyroidism of renal origin: Secondary | ICD-10-CM | POA: Diagnosis not present

## 2014-07-11 DIAGNOSIS — D631 Anemia in chronic kidney disease: Secondary | ICD-10-CM | POA: Diagnosis not present

## 2014-07-11 DIAGNOSIS — N186 End stage renal disease: Secondary | ICD-10-CM | POA: Diagnosis not present

## 2014-07-12 DIAGNOSIS — N186 End stage renal disease: Secondary | ICD-10-CM | POA: Diagnosis not present

## 2014-07-12 DIAGNOSIS — N2581 Secondary hyperparathyroidism of renal origin: Secondary | ICD-10-CM | POA: Diagnosis not present

## 2014-07-12 DIAGNOSIS — D631 Anemia in chronic kidney disease: Secondary | ICD-10-CM | POA: Diagnosis not present

## 2014-07-12 DIAGNOSIS — D509 Iron deficiency anemia, unspecified: Secondary | ICD-10-CM | POA: Diagnosis not present

## 2014-07-14 DIAGNOSIS — N186 End stage renal disease: Secondary | ICD-10-CM | POA: Diagnosis not present

## 2014-07-14 DIAGNOSIS — D631 Anemia in chronic kidney disease: Secondary | ICD-10-CM | POA: Diagnosis not present

## 2014-07-14 DIAGNOSIS — D509 Iron deficiency anemia, unspecified: Secondary | ICD-10-CM | POA: Diagnosis not present

## 2014-07-14 DIAGNOSIS — N2581 Secondary hyperparathyroidism of renal origin: Secondary | ICD-10-CM | POA: Diagnosis not present

## 2014-07-17 DIAGNOSIS — N2581 Secondary hyperparathyroidism of renal origin: Secondary | ICD-10-CM | POA: Diagnosis not present

## 2014-07-17 DIAGNOSIS — D631 Anemia in chronic kidney disease: Secondary | ICD-10-CM | POA: Diagnosis not present

## 2014-07-17 DIAGNOSIS — N186 End stage renal disease: Secondary | ICD-10-CM | POA: Diagnosis not present

## 2014-07-17 DIAGNOSIS — D509 Iron deficiency anemia, unspecified: Secondary | ICD-10-CM | POA: Diagnosis not present

## 2014-07-18 DIAGNOSIS — N186 End stage renal disease: Secondary | ICD-10-CM | POA: Diagnosis not present

## 2014-07-18 DIAGNOSIS — Z992 Dependence on renal dialysis: Secondary | ICD-10-CM | POA: Diagnosis not present

## 2014-07-18 DIAGNOSIS — Z452 Encounter for adjustment and management of vascular access device: Secondary | ICD-10-CM | POA: Diagnosis not present

## 2014-07-19 DIAGNOSIS — N186 End stage renal disease: Secondary | ICD-10-CM | POA: Diagnosis not present

## 2014-07-19 DIAGNOSIS — D631 Anemia in chronic kidney disease: Secondary | ICD-10-CM | POA: Diagnosis not present

## 2014-07-19 DIAGNOSIS — D509 Iron deficiency anemia, unspecified: Secondary | ICD-10-CM | POA: Diagnosis not present

## 2014-07-19 DIAGNOSIS — N2581 Secondary hyperparathyroidism of renal origin: Secondary | ICD-10-CM | POA: Diagnosis not present

## 2014-07-20 ENCOUNTER — Ambulatory Visit
Admission: RE | Admit: 2014-07-20 | Discharge: 2014-07-20 | Disposition: A | Discharge status: Home / Self Care | Payer: Medicare Other | Source: Ambulatory Visit | Attending: Family Medicine | Admitting: Family Medicine

## 2014-07-20 DIAGNOSIS — H538 Other visual disturbances: Secondary | ICD-10-CM | POA: Diagnosis not present

## 2014-07-20 DIAGNOSIS — R51 Headache: Secondary | ICD-10-CM | POA: Diagnosis not present

## 2014-07-20 DIAGNOSIS — G319 Degenerative disease of nervous system, unspecified: Secondary | ICD-10-CM | POA: Diagnosis not present

## 2014-07-20 DIAGNOSIS — R42 Dizziness and giddiness: Secondary | ICD-10-CM | POA: Diagnosis not present

## 2014-07-20 DIAGNOSIS — R519 Headache, unspecified: Secondary | ICD-10-CM

## 2014-07-21 DIAGNOSIS — N186 End stage renal disease: Secondary | ICD-10-CM | POA: Diagnosis not present

## 2014-07-21 DIAGNOSIS — D631 Anemia in chronic kidney disease: Secondary | ICD-10-CM | POA: Diagnosis not present

## 2014-07-21 DIAGNOSIS — D509 Iron deficiency anemia, unspecified: Secondary | ICD-10-CM | POA: Diagnosis not present

## 2014-07-21 DIAGNOSIS — N2581 Secondary hyperparathyroidism of renal origin: Secondary | ICD-10-CM | POA: Diagnosis not present

## 2014-07-24 DIAGNOSIS — N186 End stage renal disease: Secondary | ICD-10-CM | POA: Diagnosis not present

## 2014-07-24 DIAGNOSIS — D631 Anemia in chronic kidney disease: Secondary | ICD-10-CM | POA: Diagnosis not present

## 2014-07-24 DIAGNOSIS — N2581 Secondary hyperparathyroidism of renal origin: Secondary | ICD-10-CM | POA: Diagnosis not present

## 2014-07-24 DIAGNOSIS — D509 Iron deficiency anemia, unspecified: Secondary | ICD-10-CM | POA: Diagnosis not present

## 2014-07-26 DIAGNOSIS — N186 End stage renal disease: Secondary | ICD-10-CM | POA: Diagnosis not present

## 2014-07-26 DIAGNOSIS — N2581 Secondary hyperparathyroidism of renal origin: Secondary | ICD-10-CM | POA: Diagnosis not present

## 2014-07-26 DIAGNOSIS — D509 Iron deficiency anemia, unspecified: Secondary | ICD-10-CM | POA: Diagnosis not present

## 2014-07-26 DIAGNOSIS — D631 Anemia in chronic kidney disease: Secondary | ICD-10-CM | POA: Diagnosis not present

## 2014-07-28 DIAGNOSIS — D631 Anemia in chronic kidney disease: Secondary | ICD-10-CM | POA: Diagnosis not present

## 2014-07-28 DIAGNOSIS — D509 Iron deficiency anemia, unspecified: Secondary | ICD-10-CM | POA: Diagnosis not present

## 2014-07-28 DIAGNOSIS — N2581 Secondary hyperparathyroidism of renal origin: Secondary | ICD-10-CM | POA: Diagnosis not present

## 2014-07-28 DIAGNOSIS — N186 End stage renal disease: Secondary | ICD-10-CM | POA: Diagnosis not present

## 2014-07-31 DIAGNOSIS — D509 Iron deficiency anemia, unspecified: Secondary | ICD-10-CM | POA: Diagnosis not present

## 2014-07-31 DIAGNOSIS — D631 Anemia in chronic kidney disease: Secondary | ICD-10-CM | POA: Diagnosis not present

## 2014-07-31 DIAGNOSIS — N2581 Secondary hyperparathyroidism of renal origin: Secondary | ICD-10-CM | POA: Diagnosis not present

## 2014-07-31 DIAGNOSIS — N186 End stage renal disease: Secondary | ICD-10-CM | POA: Diagnosis not present

## 2014-08-02 DIAGNOSIS — D509 Iron deficiency anemia, unspecified: Secondary | ICD-10-CM | POA: Diagnosis not present

## 2014-08-02 DIAGNOSIS — N2581 Secondary hyperparathyroidism of renal origin: Secondary | ICD-10-CM | POA: Diagnosis not present

## 2014-08-02 DIAGNOSIS — D631 Anemia in chronic kidney disease: Secondary | ICD-10-CM | POA: Diagnosis not present

## 2014-08-02 DIAGNOSIS — N186 End stage renal disease: Secondary | ICD-10-CM | POA: Diagnosis not present

## 2014-08-04 DIAGNOSIS — N2581 Secondary hyperparathyroidism of renal origin: Secondary | ICD-10-CM | POA: Diagnosis not present

## 2014-08-04 DIAGNOSIS — D631 Anemia in chronic kidney disease: Secondary | ICD-10-CM | POA: Diagnosis not present

## 2014-08-04 DIAGNOSIS — N186 End stage renal disease: Secondary | ICD-10-CM | POA: Diagnosis not present

## 2014-08-04 DIAGNOSIS — Z992 Dependence on renal dialysis: Secondary | ICD-10-CM | POA: Diagnosis not present

## 2014-08-04 DIAGNOSIS — D509 Iron deficiency anemia, unspecified: Secondary | ICD-10-CM | POA: Diagnosis not present

## 2014-08-07 DIAGNOSIS — D509 Iron deficiency anemia, unspecified: Secondary | ICD-10-CM | POA: Diagnosis not present

## 2014-08-07 DIAGNOSIS — N186 End stage renal disease: Secondary | ICD-10-CM | POA: Diagnosis not present

## 2014-08-07 DIAGNOSIS — N2581 Secondary hyperparathyroidism of renal origin: Secondary | ICD-10-CM | POA: Diagnosis not present

## 2014-08-07 DIAGNOSIS — D631 Anemia in chronic kidney disease: Secondary | ICD-10-CM | POA: Diagnosis not present

## 2014-08-13 ENCOUNTER — Ambulatory Visit: Payer: Medicare Other | Admitting: Internal Medicine

## 2014-08-23 DIAGNOSIS — D689 Coagulation defect, unspecified: Secondary | ICD-10-CM | POA: Diagnosis not present

## 2014-08-27 DIAGNOSIS — I2782 Chronic pulmonary embolism: Secondary | ICD-10-CM | POA: Diagnosis not present

## 2014-08-27 DIAGNOSIS — Z86711 Personal history of pulmonary embolism: Secondary | ICD-10-CM | POA: Diagnosis not present

## 2014-09-01 DIAGNOSIS — D689 Coagulation defect, unspecified: Secondary | ICD-10-CM | POA: Diagnosis not present

## 2014-09-03 DIAGNOSIS — N186 End stage renal disease: Secondary | ICD-10-CM | POA: Diagnosis not present

## 2014-09-03 DIAGNOSIS — Z992 Dependence on renal dialysis: Secondary | ICD-10-CM | POA: Diagnosis not present

## 2014-09-04 DIAGNOSIS — D509 Iron deficiency anemia, unspecified: Secondary | ICD-10-CM | POA: Diagnosis not present

## 2014-09-04 DIAGNOSIS — Z86711 Personal history of pulmonary embolism: Secondary | ICD-10-CM | POA: Diagnosis not present

## 2014-09-04 DIAGNOSIS — N186 End stage renal disease: Secondary | ICD-10-CM | POA: Diagnosis not present

## 2014-09-04 DIAGNOSIS — I2782 Chronic pulmonary embolism: Secondary | ICD-10-CM | POA: Diagnosis not present

## 2014-09-04 DIAGNOSIS — N2581 Secondary hyperparathyroidism of renal origin: Secondary | ICD-10-CM | POA: Diagnosis not present

## 2014-09-04 DIAGNOSIS — D631 Anemia in chronic kidney disease: Secondary | ICD-10-CM | POA: Diagnosis not present

## 2014-09-07 DIAGNOSIS — E1121 Type 2 diabetes mellitus with diabetic nephropathy: Secondary | ICD-10-CM | POA: Diagnosis not present

## 2014-09-07 DIAGNOSIS — M109 Gout, unspecified: Secondary | ICD-10-CM | POA: Diagnosis not present

## 2014-09-07 DIAGNOSIS — I10 Essential (primary) hypertension: Secondary | ICD-10-CM | POA: Diagnosis not present

## 2014-09-07 DIAGNOSIS — E785 Hyperlipidemia, unspecified: Secondary | ICD-10-CM | POA: Diagnosis not present

## 2014-09-07 DIAGNOSIS — N186 End stage renal disease: Secondary | ICD-10-CM | POA: Diagnosis not present

## 2014-09-07 DIAGNOSIS — E662 Morbid (severe) obesity with alveolar hypoventilation: Secondary | ICD-10-CM | POA: Diagnosis not present

## 2014-09-07 DIAGNOSIS — Z7901 Long term (current) use of anticoagulants: Secondary | ICD-10-CM | POA: Diagnosis not present

## 2014-09-11 DIAGNOSIS — Z86711 Personal history of pulmonary embolism: Secondary | ICD-10-CM | POA: Diagnosis not present

## 2014-09-11 DIAGNOSIS — I2782 Chronic pulmonary embolism: Secondary | ICD-10-CM | POA: Diagnosis not present

## 2014-09-20 DIAGNOSIS — I2782 Chronic pulmonary embolism: Secondary | ICD-10-CM | POA: Diagnosis not present

## 2014-09-20 DIAGNOSIS — Z86711 Personal history of pulmonary embolism: Secondary | ICD-10-CM | POA: Diagnosis not present

## 2014-09-25 DIAGNOSIS — D689 Coagulation defect, unspecified: Secondary | ICD-10-CM | POA: Diagnosis not present

## 2014-10-02 DIAGNOSIS — I2782 Chronic pulmonary embolism: Secondary | ICD-10-CM | POA: Diagnosis not present

## 2014-10-02 DIAGNOSIS — Z86711 Personal history of pulmonary embolism: Secondary | ICD-10-CM | POA: Diagnosis not present

## 2014-10-04 DIAGNOSIS — Z992 Dependence on renal dialysis: Secondary | ICD-10-CM | POA: Diagnosis not present

## 2014-10-04 DIAGNOSIS — N186 End stage renal disease: Secondary | ICD-10-CM | POA: Diagnosis not present

## 2014-10-06 DIAGNOSIS — D509 Iron deficiency anemia, unspecified: Secondary | ICD-10-CM | POA: Diagnosis not present

## 2014-10-06 DIAGNOSIS — N2581 Secondary hyperparathyroidism of renal origin: Secondary | ICD-10-CM | POA: Diagnosis not present

## 2014-10-06 DIAGNOSIS — D631 Anemia in chronic kidney disease: Secondary | ICD-10-CM | POA: Diagnosis not present

## 2014-10-06 DIAGNOSIS — N186 End stage renal disease: Secondary | ICD-10-CM | POA: Diagnosis not present

## 2014-10-08 DIAGNOSIS — D631 Anemia in chronic kidney disease: Secondary | ICD-10-CM | POA: Diagnosis not present

## 2014-10-08 DIAGNOSIS — N186 End stage renal disease: Secondary | ICD-10-CM | POA: Diagnosis not present

## 2014-10-08 DIAGNOSIS — N2581 Secondary hyperparathyroidism of renal origin: Secondary | ICD-10-CM | POA: Diagnosis not present

## 2014-10-08 DIAGNOSIS — D509 Iron deficiency anemia, unspecified: Secondary | ICD-10-CM | POA: Diagnosis not present

## 2014-10-09 DIAGNOSIS — N2581 Secondary hyperparathyroidism of renal origin: Secondary | ICD-10-CM | POA: Diagnosis not present

## 2014-10-09 DIAGNOSIS — D631 Anemia in chronic kidney disease: Secondary | ICD-10-CM | POA: Diagnosis not present

## 2014-10-09 DIAGNOSIS — D509 Iron deficiency anemia, unspecified: Secondary | ICD-10-CM | POA: Diagnosis not present

## 2014-10-09 DIAGNOSIS — N186 End stage renal disease: Secondary | ICD-10-CM | POA: Diagnosis not present

## 2014-10-11 DIAGNOSIS — N186 End stage renal disease: Secondary | ICD-10-CM | POA: Diagnosis not present

## 2014-10-11 DIAGNOSIS — D631 Anemia in chronic kidney disease: Secondary | ICD-10-CM | POA: Diagnosis not present

## 2014-10-11 DIAGNOSIS — I2782 Chronic pulmonary embolism: Secondary | ICD-10-CM | POA: Diagnosis not present

## 2014-10-11 DIAGNOSIS — D509 Iron deficiency anemia, unspecified: Secondary | ICD-10-CM | POA: Diagnosis not present

## 2014-10-11 DIAGNOSIS — Z86711 Personal history of pulmonary embolism: Secondary | ICD-10-CM | POA: Diagnosis not present

## 2014-10-11 DIAGNOSIS — N2581 Secondary hyperparathyroidism of renal origin: Secondary | ICD-10-CM | POA: Diagnosis not present

## 2014-10-13 DIAGNOSIS — D631 Anemia in chronic kidney disease: Secondary | ICD-10-CM | POA: Diagnosis not present

## 2014-10-13 DIAGNOSIS — N186 End stage renal disease: Secondary | ICD-10-CM | POA: Diagnosis not present

## 2014-10-13 DIAGNOSIS — D509 Iron deficiency anemia, unspecified: Secondary | ICD-10-CM | POA: Diagnosis not present

## 2014-10-13 DIAGNOSIS — N2581 Secondary hyperparathyroidism of renal origin: Secondary | ICD-10-CM | POA: Diagnosis not present

## 2014-10-15 DIAGNOSIS — N186 End stage renal disease: Secondary | ICD-10-CM | POA: Diagnosis not present

## 2014-10-15 DIAGNOSIS — N2581 Secondary hyperparathyroidism of renal origin: Secondary | ICD-10-CM | POA: Diagnosis not present

## 2014-10-15 DIAGNOSIS — D509 Iron deficiency anemia, unspecified: Secondary | ICD-10-CM | POA: Diagnosis not present

## 2014-10-15 DIAGNOSIS — D631 Anemia in chronic kidney disease: Secondary | ICD-10-CM | POA: Diagnosis not present

## 2014-10-16 DIAGNOSIS — N2581 Secondary hyperparathyroidism of renal origin: Secondary | ICD-10-CM | POA: Diagnosis not present

## 2014-10-16 DIAGNOSIS — D509 Iron deficiency anemia, unspecified: Secondary | ICD-10-CM | POA: Diagnosis not present

## 2014-10-16 DIAGNOSIS — D631 Anemia in chronic kidney disease: Secondary | ICD-10-CM | POA: Diagnosis not present

## 2014-10-16 DIAGNOSIS — N186 End stage renal disease: Secondary | ICD-10-CM | POA: Diagnosis not present

## 2014-10-18 DIAGNOSIS — D509 Iron deficiency anemia, unspecified: Secondary | ICD-10-CM | POA: Diagnosis not present

## 2014-10-18 DIAGNOSIS — Z86711 Personal history of pulmonary embolism: Secondary | ICD-10-CM | POA: Diagnosis not present

## 2014-10-18 DIAGNOSIS — N2581 Secondary hyperparathyroidism of renal origin: Secondary | ICD-10-CM | POA: Diagnosis not present

## 2014-10-18 DIAGNOSIS — D631 Anemia in chronic kidney disease: Secondary | ICD-10-CM | POA: Diagnosis not present

## 2014-10-18 DIAGNOSIS — I2782 Chronic pulmonary embolism: Secondary | ICD-10-CM | POA: Diagnosis not present

## 2014-10-18 DIAGNOSIS — N186 End stage renal disease: Secondary | ICD-10-CM | POA: Diagnosis not present

## 2014-10-20 DIAGNOSIS — D509 Iron deficiency anemia, unspecified: Secondary | ICD-10-CM | POA: Diagnosis not present

## 2014-10-20 DIAGNOSIS — D631 Anemia in chronic kidney disease: Secondary | ICD-10-CM | POA: Diagnosis not present

## 2014-10-20 DIAGNOSIS — N2581 Secondary hyperparathyroidism of renal origin: Secondary | ICD-10-CM | POA: Diagnosis not present

## 2014-10-20 DIAGNOSIS — N186 End stage renal disease: Secondary | ICD-10-CM | POA: Diagnosis not present

## 2014-10-22 DIAGNOSIS — N186 End stage renal disease: Secondary | ICD-10-CM | POA: Diagnosis not present

## 2014-10-22 DIAGNOSIS — D631 Anemia in chronic kidney disease: Secondary | ICD-10-CM | POA: Diagnosis not present

## 2014-10-22 DIAGNOSIS — N2581 Secondary hyperparathyroidism of renal origin: Secondary | ICD-10-CM | POA: Diagnosis not present

## 2014-10-22 DIAGNOSIS — D509 Iron deficiency anemia, unspecified: Secondary | ICD-10-CM | POA: Diagnosis not present

## 2014-10-23 DIAGNOSIS — N2581 Secondary hyperparathyroidism of renal origin: Secondary | ICD-10-CM | POA: Diagnosis not present

## 2014-10-23 DIAGNOSIS — D631 Anemia in chronic kidney disease: Secondary | ICD-10-CM | POA: Diagnosis not present

## 2014-10-23 DIAGNOSIS — N186 End stage renal disease: Secondary | ICD-10-CM | POA: Diagnosis not present

## 2014-10-23 DIAGNOSIS — D509 Iron deficiency anemia, unspecified: Secondary | ICD-10-CM | POA: Diagnosis not present

## 2014-10-25 DIAGNOSIS — D509 Iron deficiency anemia, unspecified: Secondary | ICD-10-CM | POA: Diagnosis not present

## 2014-10-25 DIAGNOSIS — D631 Anemia in chronic kidney disease: Secondary | ICD-10-CM | POA: Diagnosis not present

## 2014-10-25 DIAGNOSIS — I2782 Chronic pulmonary embolism: Secondary | ICD-10-CM | POA: Diagnosis not present

## 2014-10-25 DIAGNOSIS — N186 End stage renal disease: Secondary | ICD-10-CM | POA: Diagnosis not present

## 2014-10-25 DIAGNOSIS — Z86711 Personal history of pulmonary embolism: Secondary | ICD-10-CM | POA: Diagnosis not present

## 2014-10-25 DIAGNOSIS — N2581 Secondary hyperparathyroidism of renal origin: Secondary | ICD-10-CM | POA: Diagnosis not present

## 2014-10-27 DIAGNOSIS — D509 Iron deficiency anemia, unspecified: Secondary | ICD-10-CM | POA: Diagnosis not present

## 2014-10-27 DIAGNOSIS — D631 Anemia in chronic kidney disease: Secondary | ICD-10-CM | POA: Diagnosis not present

## 2014-10-27 DIAGNOSIS — N186 End stage renal disease: Secondary | ICD-10-CM | POA: Diagnosis not present

## 2014-10-27 DIAGNOSIS — N2581 Secondary hyperparathyroidism of renal origin: Secondary | ICD-10-CM | POA: Diagnosis not present

## 2014-10-29 DIAGNOSIS — N2581 Secondary hyperparathyroidism of renal origin: Secondary | ICD-10-CM | POA: Diagnosis not present

## 2014-10-29 DIAGNOSIS — D631 Anemia in chronic kidney disease: Secondary | ICD-10-CM | POA: Diagnosis not present

## 2014-10-29 DIAGNOSIS — N186 End stage renal disease: Secondary | ICD-10-CM | POA: Diagnosis not present

## 2014-10-29 DIAGNOSIS — D509 Iron deficiency anemia, unspecified: Secondary | ICD-10-CM | POA: Diagnosis not present

## 2014-10-30 DIAGNOSIS — N2581 Secondary hyperparathyroidism of renal origin: Secondary | ICD-10-CM | POA: Diagnosis not present

## 2014-10-30 DIAGNOSIS — D509 Iron deficiency anemia, unspecified: Secondary | ICD-10-CM | POA: Diagnosis not present

## 2014-10-30 DIAGNOSIS — N186 End stage renal disease: Secondary | ICD-10-CM | POA: Diagnosis not present

## 2014-10-30 DIAGNOSIS — D631 Anemia in chronic kidney disease: Secondary | ICD-10-CM | POA: Diagnosis not present

## 2014-11-01 DIAGNOSIS — N2581 Secondary hyperparathyroidism of renal origin: Secondary | ICD-10-CM | POA: Diagnosis not present

## 2014-11-01 DIAGNOSIS — Z86711 Personal history of pulmonary embolism: Secondary | ICD-10-CM | POA: Diagnosis not present

## 2014-11-01 DIAGNOSIS — I2782 Chronic pulmonary embolism: Secondary | ICD-10-CM | POA: Diagnosis not present

## 2014-11-01 DIAGNOSIS — N186 End stage renal disease: Secondary | ICD-10-CM | POA: Diagnosis not present

## 2014-11-01 DIAGNOSIS — D509 Iron deficiency anemia, unspecified: Secondary | ICD-10-CM | POA: Diagnosis not present

## 2014-11-01 DIAGNOSIS — D631 Anemia in chronic kidney disease: Secondary | ICD-10-CM | POA: Diagnosis not present

## 2014-11-03 DIAGNOSIS — D631 Anemia in chronic kidney disease: Secondary | ICD-10-CM | POA: Diagnosis not present

## 2014-11-03 DIAGNOSIS — N2581 Secondary hyperparathyroidism of renal origin: Secondary | ICD-10-CM | POA: Diagnosis not present

## 2014-11-03 DIAGNOSIS — D509 Iron deficiency anemia, unspecified: Secondary | ICD-10-CM | POA: Diagnosis not present

## 2014-11-03 DIAGNOSIS — N186 End stage renal disease: Secondary | ICD-10-CM | POA: Diagnosis not present

## 2014-11-04 DIAGNOSIS — N186 End stage renal disease: Secondary | ICD-10-CM | POA: Diagnosis not present

## 2014-11-05 DIAGNOSIS — N186 End stage renal disease: Secondary | ICD-10-CM | POA: Diagnosis not present

## 2014-11-05 DIAGNOSIS — N2581 Secondary hyperparathyroidism of renal origin: Secondary | ICD-10-CM | POA: Diagnosis not present

## 2014-11-05 DIAGNOSIS — D509 Iron deficiency anemia, unspecified: Secondary | ICD-10-CM | POA: Diagnosis not present

## 2014-11-05 DIAGNOSIS — E1129 Type 2 diabetes mellitus with other diabetic kidney complication: Secondary | ICD-10-CM | POA: Diagnosis not present

## 2014-11-06 DIAGNOSIS — N2581 Secondary hyperparathyroidism of renal origin: Secondary | ICD-10-CM | POA: Diagnosis not present

## 2014-11-06 DIAGNOSIS — D509 Iron deficiency anemia, unspecified: Secondary | ICD-10-CM | POA: Diagnosis not present

## 2014-11-06 DIAGNOSIS — N186 End stage renal disease: Secondary | ICD-10-CM | POA: Diagnosis not present

## 2014-11-06 DIAGNOSIS — E1129 Type 2 diabetes mellitus with other diabetic kidney complication: Secondary | ICD-10-CM | POA: Diagnosis not present

## 2014-11-08 DIAGNOSIS — N186 End stage renal disease: Secondary | ICD-10-CM | POA: Diagnosis not present

## 2014-11-08 DIAGNOSIS — D509 Iron deficiency anemia, unspecified: Secondary | ICD-10-CM | POA: Diagnosis not present

## 2014-11-08 DIAGNOSIS — E1129 Type 2 diabetes mellitus with other diabetic kidney complication: Secondary | ICD-10-CM | POA: Diagnosis not present

## 2014-11-08 DIAGNOSIS — Z86711 Personal history of pulmonary embolism: Secondary | ICD-10-CM | POA: Diagnosis not present

## 2014-11-08 DIAGNOSIS — N2581 Secondary hyperparathyroidism of renal origin: Secondary | ICD-10-CM | POA: Diagnosis not present

## 2014-11-08 DIAGNOSIS — I2782 Chronic pulmonary embolism: Secondary | ICD-10-CM | POA: Diagnosis not present

## 2014-11-10 DIAGNOSIS — N186 End stage renal disease: Secondary | ICD-10-CM | POA: Diagnosis not present

## 2014-11-10 DIAGNOSIS — E1129 Type 2 diabetes mellitus with other diabetic kidney complication: Secondary | ICD-10-CM | POA: Diagnosis not present

## 2014-11-10 DIAGNOSIS — N2581 Secondary hyperparathyroidism of renal origin: Secondary | ICD-10-CM | POA: Diagnosis not present

## 2014-11-10 DIAGNOSIS — D509 Iron deficiency anemia, unspecified: Secondary | ICD-10-CM | POA: Diagnosis not present

## 2014-11-12 DIAGNOSIS — E1129 Type 2 diabetes mellitus with other diabetic kidney complication: Secondary | ICD-10-CM | POA: Diagnosis not present

## 2014-11-12 DIAGNOSIS — D509 Iron deficiency anemia, unspecified: Secondary | ICD-10-CM | POA: Diagnosis not present

## 2014-11-12 DIAGNOSIS — N186 End stage renal disease: Secondary | ICD-10-CM | POA: Diagnosis not present

## 2014-11-12 DIAGNOSIS — N2581 Secondary hyperparathyroidism of renal origin: Secondary | ICD-10-CM | POA: Diagnosis not present

## 2014-11-13 DIAGNOSIS — N186 End stage renal disease: Secondary | ICD-10-CM | POA: Diagnosis not present

## 2014-11-13 DIAGNOSIS — N2581 Secondary hyperparathyroidism of renal origin: Secondary | ICD-10-CM | POA: Diagnosis not present

## 2014-11-13 DIAGNOSIS — D509 Iron deficiency anemia, unspecified: Secondary | ICD-10-CM | POA: Diagnosis not present

## 2014-11-13 DIAGNOSIS — E1129 Type 2 diabetes mellitus with other diabetic kidney complication: Secondary | ICD-10-CM | POA: Diagnosis not present

## 2014-11-15 DIAGNOSIS — D509 Iron deficiency anemia, unspecified: Secondary | ICD-10-CM | POA: Diagnosis not present

## 2014-11-15 DIAGNOSIS — N2581 Secondary hyperparathyroidism of renal origin: Secondary | ICD-10-CM | POA: Diagnosis not present

## 2014-11-15 DIAGNOSIS — Z86711 Personal history of pulmonary embolism: Secondary | ICD-10-CM | POA: Diagnosis not present

## 2014-11-15 DIAGNOSIS — I2782 Chronic pulmonary embolism: Secondary | ICD-10-CM | POA: Diagnosis not present

## 2014-11-15 DIAGNOSIS — E1129 Type 2 diabetes mellitus with other diabetic kidney complication: Secondary | ICD-10-CM | POA: Diagnosis not present

## 2014-11-15 DIAGNOSIS — N186 End stage renal disease: Secondary | ICD-10-CM | POA: Diagnosis not present

## 2014-11-17 DIAGNOSIS — N2581 Secondary hyperparathyroidism of renal origin: Secondary | ICD-10-CM | POA: Diagnosis not present

## 2014-11-17 DIAGNOSIS — N186 End stage renal disease: Secondary | ICD-10-CM | POA: Diagnosis not present

## 2014-11-17 DIAGNOSIS — D509 Iron deficiency anemia, unspecified: Secondary | ICD-10-CM | POA: Diagnosis not present

## 2014-11-17 DIAGNOSIS — E1129 Type 2 diabetes mellitus with other diabetic kidney complication: Secondary | ICD-10-CM | POA: Diagnosis not present

## 2014-11-19 DIAGNOSIS — E1129 Type 2 diabetes mellitus with other diabetic kidney complication: Secondary | ICD-10-CM | POA: Diagnosis not present

## 2014-11-19 DIAGNOSIS — N2581 Secondary hyperparathyroidism of renal origin: Secondary | ICD-10-CM | POA: Diagnosis not present

## 2014-11-19 DIAGNOSIS — N186 End stage renal disease: Secondary | ICD-10-CM | POA: Diagnosis not present

## 2014-11-19 DIAGNOSIS — D509 Iron deficiency anemia, unspecified: Secondary | ICD-10-CM | POA: Diagnosis not present

## 2014-11-20 DIAGNOSIS — D509 Iron deficiency anemia, unspecified: Secondary | ICD-10-CM | POA: Diagnosis not present

## 2014-11-20 DIAGNOSIS — N2581 Secondary hyperparathyroidism of renal origin: Secondary | ICD-10-CM | POA: Diagnosis not present

## 2014-11-20 DIAGNOSIS — N186 End stage renal disease: Secondary | ICD-10-CM | POA: Diagnosis not present

## 2014-11-20 DIAGNOSIS — E1129 Type 2 diabetes mellitus with other diabetic kidney complication: Secondary | ICD-10-CM | POA: Diagnosis not present

## 2014-11-22 DIAGNOSIS — D509 Iron deficiency anemia, unspecified: Secondary | ICD-10-CM | POA: Diagnosis not present

## 2014-11-22 DIAGNOSIS — E1129 Type 2 diabetes mellitus with other diabetic kidney complication: Secondary | ICD-10-CM | POA: Diagnosis not present

## 2014-11-22 DIAGNOSIS — Z86711 Personal history of pulmonary embolism: Secondary | ICD-10-CM | POA: Diagnosis not present

## 2014-11-22 DIAGNOSIS — N2581 Secondary hyperparathyroidism of renal origin: Secondary | ICD-10-CM | POA: Diagnosis not present

## 2014-11-22 DIAGNOSIS — I2782 Chronic pulmonary embolism: Secondary | ICD-10-CM | POA: Diagnosis not present

## 2014-11-22 DIAGNOSIS — N186 End stage renal disease: Secondary | ICD-10-CM | POA: Diagnosis not present

## 2014-11-24 DIAGNOSIS — N2581 Secondary hyperparathyroidism of renal origin: Secondary | ICD-10-CM | POA: Diagnosis not present

## 2014-11-24 DIAGNOSIS — E1129 Type 2 diabetes mellitus with other diabetic kidney complication: Secondary | ICD-10-CM | POA: Diagnosis not present

## 2014-11-24 DIAGNOSIS — D509 Iron deficiency anemia, unspecified: Secondary | ICD-10-CM | POA: Diagnosis not present

## 2014-11-24 DIAGNOSIS — N186 End stage renal disease: Secondary | ICD-10-CM | POA: Diagnosis not present

## 2014-11-26 DIAGNOSIS — E1129 Type 2 diabetes mellitus with other diabetic kidney complication: Secondary | ICD-10-CM | POA: Diagnosis not present

## 2014-11-26 DIAGNOSIS — N186 End stage renal disease: Secondary | ICD-10-CM | POA: Diagnosis not present

## 2014-11-26 DIAGNOSIS — N2581 Secondary hyperparathyroidism of renal origin: Secondary | ICD-10-CM | POA: Diagnosis not present

## 2014-11-26 DIAGNOSIS — D509 Iron deficiency anemia, unspecified: Secondary | ICD-10-CM | POA: Diagnosis not present

## 2014-11-27 DIAGNOSIS — E1129 Type 2 diabetes mellitus with other diabetic kidney complication: Secondary | ICD-10-CM | POA: Diagnosis not present

## 2014-11-27 DIAGNOSIS — D509 Iron deficiency anemia, unspecified: Secondary | ICD-10-CM | POA: Diagnosis not present

## 2014-11-27 DIAGNOSIS — N186 End stage renal disease: Secondary | ICD-10-CM | POA: Diagnosis not present

## 2014-11-27 DIAGNOSIS — N2581 Secondary hyperparathyroidism of renal origin: Secondary | ICD-10-CM | POA: Diagnosis not present

## 2014-11-29 DIAGNOSIS — E1129 Type 2 diabetes mellitus with other diabetic kidney complication: Secondary | ICD-10-CM | POA: Diagnosis not present

## 2014-11-29 DIAGNOSIS — N186 End stage renal disease: Secondary | ICD-10-CM | POA: Diagnosis not present

## 2014-11-29 DIAGNOSIS — I2782 Chronic pulmonary embolism: Secondary | ICD-10-CM | POA: Diagnosis not present

## 2014-11-29 DIAGNOSIS — N2581 Secondary hyperparathyroidism of renal origin: Secondary | ICD-10-CM | POA: Diagnosis not present

## 2014-11-29 DIAGNOSIS — Z86711 Personal history of pulmonary embolism: Secondary | ICD-10-CM | POA: Diagnosis not present

## 2014-11-29 DIAGNOSIS — D509 Iron deficiency anemia, unspecified: Secondary | ICD-10-CM | POA: Diagnosis not present

## 2014-11-30 DIAGNOSIS — M7752 Other enthesopathy of left foot: Secondary | ICD-10-CM | POA: Diagnosis not present

## 2014-11-30 DIAGNOSIS — M79672 Pain in left foot: Secondary | ICD-10-CM | POA: Diagnosis not present

## 2014-11-30 DIAGNOSIS — G5752 Tarsal tunnel syndrome, left lower limb: Secondary | ICD-10-CM | POA: Diagnosis not present

## 2014-12-01 DIAGNOSIS — N2581 Secondary hyperparathyroidism of renal origin: Secondary | ICD-10-CM | POA: Diagnosis not present

## 2014-12-01 DIAGNOSIS — D509 Iron deficiency anemia, unspecified: Secondary | ICD-10-CM | POA: Diagnosis not present

## 2014-12-01 DIAGNOSIS — N186 End stage renal disease: Secondary | ICD-10-CM | POA: Diagnosis not present

## 2014-12-01 DIAGNOSIS — E1129 Type 2 diabetes mellitus with other diabetic kidney complication: Secondary | ICD-10-CM | POA: Diagnosis not present

## 2014-12-03 DIAGNOSIS — N2581 Secondary hyperparathyroidism of renal origin: Secondary | ICD-10-CM | POA: Diagnosis not present

## 2014-12-03 DIAGNOSIS — E1129 Type 2 diabetes mellitus with other diabetic kidney complication: Secondary | ICD-10-CM | POA: Diagnosis not present

## 2014-12-03 DIAGNOSIS — N186 End stage renal disease: Secondary | ICD-10-CM | POA: Diagnosis not present

## 2014-12-03 DIAGNOSIS — D509 Iron deficiency anemia, unspecified: Secondary | ICD-10-CM | POA: Diagnosis not present

## 2014-12-03 DIAGNOSIS — Z992 Dependence on renal dialysis: Secondary | ICD-10-CM | POA: Diagnosis not present

## 2014-12-04 DIAGNOSIS — N2581 Secondary hyperparathyroidism of renal origin: Secondary | ICD-10-CM | POA: Diagnosis not present

## 2014-12-04 DIAGNOSIS — N186 End stage renal disease: Secondary | ICD-10-CM | POA: Diagnosis not present

## 2014-12-04 DIAGNOSIS — E1129 Type 2 diabetes mellitus with other diabetic kidney complication: Secondary | ICD-10-CM | POA: Diagnosis not present

## 2014-12-04 DIAGNOSIS — Z86711 Personal history of pulmonary embolism: Secondary | ICD-10-CM | POA: Diagnosis not present

## 2014-12-05 DIAGNOSIS — G609 Hereditary and idiopathic neuropathy, unspecified: Secondary | ICD-10-CM | POA: Diagnosis not present

## 2014-12-05 DIAGNOSIS — M792 Neuralgia and neuritis, unspecified: Secondary | ICD-10-CM | POA: Diagnosis not present

## 2014-12-06 DIAGNOSIS — N186 End stage renal disease: Secondary | ICD-10-CM | POA: Diagnosis not present

## 2014-12-06 DIAGNOSIS — Z86711 Personal history of pulmonary embolism: Secondary | ICD-10-CM | POA: Diagnosis not present

## 2014-12-06 DIAGNOSIS — N2581 Secondary hyperparathyroidism of renal origin: Secondary | ICD-10-CM | POA: Diagnosis not present

## 2014-12-06 DIAGNOSIS — E1129 Type 2 diabetes mellitus with other diabetic kidney complication: Secondary | ICD-10-CM | POA: Diagnosis not present

## 2014-12-06 DIAGNOSIS — I2782 Chronic pulmonary embolism: Secondary | ICD-10-CM | POA: Diagnosis not present

## 2014-12-08 DIAGNOSIS — E8779 Other fluid overload: Secondary | ICD-10-CM | POA: Diagnosis not present

## 2014-12-08 DIAGNOSIS — Z992 Dependence on renal dialysis: Secondary | ICD-10-CM | POA: Diagnosis not present

## 2014-12-08 DIAGNOSIS — N186 End stage renal disease: Secondary | ICD-10-CM | POA: Diagnosis not present

## 2014-12-08 DIAGNOSIS — N2581 Secondary hyperparathyroidism of renal origin: Secondary | ICD-10-CM | POA: Diagnosis not present

## 2014-12-08 DIAGNOSIS — D631 Anemia in chronic kidney disease: Secondary | ICD-10-CM | POA: Diagnosis not present

## 2014-12-10 DIAGNOSIS — Z992 Dependence on renal dialysis: Secondary | ICD-10-CM | POA: Diagnosis not present

## 2014-12-10 DIAGNOSIS — N186 End stage renal disease: Secondary | ICD-10-CM | POA: Diagnosis not present

## 2014-12-10 DIAGNOSIS — D631 Anemia in chronic kidney disease: Secondary | ICD-10-CM | POA: Diagnosis not present

## 2014-12-10 DIAGNOSIS — E8779 Other fluid overload: Secondary | ICD-10-CM | POA: Diagnosis not present

## 2014-12-10 DIAGNOSIS — N2581 Secondary hyperparathyroidism of renal origin: Secondary | ICD-10-CM | POA: Diagnosis not present

## 2014-12-12 DIAGNOSIS — D631 Anemia in chronic kidney disease: Secondary | ICD-10-CM | POA: Diagnosis not present

## 2014-12-12 DIAGNOSIS — N186 End stage renal disease: Secondary | ICD-10-CM | POA: Diagnosis not present

## 2014-12-12 DIAGNOSIS — E8779 Other fluid overload: Secondary | ICD-10-CM | POA: Diagnosis not present

## 2014-12-12 DIAGNOSIS — Z992 Dependence on renal dialysis: Secondary | ICD-10-CM | POA: Diagnosis not present

## 2014-12-12 DIAGNOSIS — N2581 Secondary hyperparathyroidism of renal origin: Secondary | ICD-10-CM | POA: Diagnosis not present

## 2014-12-13 DIAGNOSIS — Z992 Dependence on renal dialysis: Secondary | ICD-10-CM | POA: Diagnosis not present

## 2014-12-13 DIAGNOSIS — N186 End stage renal disease: Secondary | ICD-10-CM | POA: Diagnosis not present

## 2014-12-13 DIAGNOSIS — D631 Anemia in chronic kidney disease: Secondary | ICD-10-CM | POA: Diagnosis not present

## 2014-12-13 DIAGNOSIS — E8779 Other fluid overload: Secondary | ICD-10-CM | POA: Diagnosis not present

## 2014-12-13 DIAGNOSIS — N2581 Secondary hyperparathyroidism of renal origin: Secondary | ICD-10-CM | POA: Diagnosis not present

## 2014-12-15 DIAGNOSIS — N2581 Secondary hyperparathyroidism of renal origin: Secondary | ICD-10-CM | POA: Diagnosis not present

## 2014-12-15 DIAGNOSIS — E1129 Type 2 diabetes mellitus with other diabetic kidney complication: Secondary | ICD-10-CM | POA: Diagnosis not present

## 2014-12-15 DIAGNOSIS — Z86711 Personal history of pulmonary embolism: Secondary | ICD-10-CM | POA: Diagnosis not present

## 2014-12-15 DIAGNOSIS — I2782 Chronic pulmonary embolism: Secondary | ICD-10-CM | POA: Diagnosis not present

## 2014-12-15 DIAGNOSIS — N186 End stage renal disease: Secondary | ICD-10-CM | POA: Diagnosis not present

## 2014-12-17 DIAGNOSIS — N186 End stage renal disease: Secondary | ICD-10-CM | POA: Diagnosis not present

## 2014-12-17 DIAGNOSIS — E1129 Type 2 diabetes mellitus with other diabetic kidney complication: Secondary | ICD-10-CM | POA: Diagnosis not present

## 2014-12-17 DIAGNOSIS — N2581 Secondary hyperparathyroidism of renal origin: Secondary | ICD-10-CM | POA: Diagnosis not present

## 2014-12-18 DIAGNOSIS — N186 End stage renal disease: Secondary | ICD-10-CM | POA: Diagnosis not present

## 2014-12-18 DIAGNOSIS — N2581 Secondary hyperparathyroidism of renal origin: Secondary | ICD-10-CM | POA: Diagnosis not present

## 2014-12-18 DIAGNOSIS — E1129 Type 2 diabetes mellitus with other diabetic kidney complication: Secondary | ICD-10-CM | POA: Diagnosis not present

## 2014-12-20 DIAGNOSIS — N2581 Secondary hyperparathyroidism of renal origin: Secondary | ICD-10-CM | POA: Diagnosis not present

## 2014-12-20 DIAGNOSIS — E1129 Type 2 diabetes mellitus with other diabetic kidney complication: Secondary | ICD-10-CM | POA: Diagnosis not present

## 2014-12-20 DIAGNOSIS — Z86711 Personal history of pulmonary embolism: Secondary | ICD-10-CM | POA: Diagnosis not present

## 2014-12-20 DIAGNOSIS — I2782 Chronic pulmonary embolism: Secondary | ICD-10-CM | POA: Diagnosis not present

## 2014-12-20 DIAGNOSIS — N186 End stage renal disease: Secondary | ICD-10-CM | POA: Diagnosis not present

## 2014-12-22 DIAGNOSIS — N186 End stage renal disease: Secondary | ICD-10-CM | POA: Diagnosis not present

## 2014-12-22 DIAGNOSIS — E1129 Type 2 diabetes mellitus with other diabetic kidney complication: Secondary | ICD-10-CM | POA: Diagnosis not present

## 2014-12-22 DIAGNOSIS — N2581 Secondary hyperparathyroidism of renal origin: Secondary | ICD-10-CM | POA: Diagnosis not present

## 2014-12-24 DIAGNOSIS — E1129 Type 2 diabetes mellitus with other diabetic kidney complication: Secondary | ICD-10-CM | POA: Diagnosis not present

## 2014-12-24 DIAGNOSIS — N186 End stage renal disease: Secondary | ICD-10-CM | POA: Diagnosis not present

## 2014-12-24 DIAGNOSIS — N2581 Secondary hyperparathyroidism of renal origin: Secondary | ICD-10-CM | POA: Diagnosis not present

## 2014-12-25 DIAGNOSIS — M792 Neuralgia and neuritis, unspecified: Secondary | ICD-10-CM | POA: Diagnosis not present

## 2014-12-25 DIAGNOSIS — E1129 Type 2 diabetes mellitus with other diabetic kidney complication: Secondary | ICD-10-CM | POA: Diagnosis not present

## 2014-12-25 DIAGNOSIS — N186 End stage renal disease: Secondary | ICD-10-CM | POA: Diagnosis not present

## 2014-12-25 DIAGNOSIS — N2581 Secondary hyperparathyroidism of renal origin: Secondary | ICD-10-CM | POA: Diagnosis not present

## 2014-12-27 DIAGNOSIS — Z86711 Personal history of pulmonary embolism: Secondary | ICD-10-CM | POA: Diagnosis not present

## 2014-12-27 DIAGNOSIS — E1129 Type 2 diabetes mellitus with other diabetic kidney complication: Secondary | ICD-10-CM | POA: Diagnosis not present

## 2014-12-27 DIAGNOSIS — N186 End stage renal disease: Secondary | ICD-10-CM | POA: Diagnosis not present

## 2014-12-27 DIAGNOSIS — I2782 Chronic pulmonary embolism: Secondary | ICD-10-CM | POA: Diagnosis not present

## 2014-12-27 DIAGNOSIS — N2581 Secondary hyperparathyroidism of renal origin: Secondary | ICD-10-CM | POA: Diagnosis not present

## 2014-12-29 DIAGNOSIS — E1129 Type 2 diabetes mellitus with other diabetic kidney complication: Secondary | ICD-10-CM | POA: Diagnosis not present

## 2014-12-29 DIAGNOSIS — N186 End stage renal disease: Secondary | ICD-10-CM | POA: Diagnosis not present

## 2014-12-29 DIAGNOSIS — N2581 Secondary hyperparathyroidism of renal origin: Secondary | ICD-10-CM | POA: Diagnosis not present

## 2014-12-31 DIAGNOSIS — N186 End stage renal disease: Secondary | ICD-10-CM | POA: Diagnosis not present

## 2014-12-31 DIAGNOSIS — E1129 Type 2 diabetes mellitus with other diabetic kidney complication: Secondary | ICD-10-CM | POA: Diagnosis not present

## 2014-12-31 DIAGNOSIS — N2581 Secondary hyperparathyroidism of renal origin: Secondary | ICD-10-CM | POA: Diagnosis not present

## 2015-01-01 DIAGNOSIS — E1129 Type 2 diabetes mellitus with other diabetic kidney complication: Secondary | ICD-10-CM | POA: Diagnosis not present

## 2015-01-01 DIAGNOSIS — N2581 Secondary hyperparathyroidism of renal origin: Secondary | ICD-10-CM | POA: Diagnosis not present

## 2015-01-01 DIAGNOSIS — N186 End stage renal disease: Secondary | ICD-10-CM | POA: Diagnosis not present

## 2015-01-03 DIAGNOSIS — N2581 Secondary hyperparathyroidism of renal origin: Secondary | ICD-10-CM | POA: Diagnosis not present

## 2015-01-03 DIAGNOSIS — Z86711 Personal history of pulmonary embolism: Secondary | ICD-10-CM | POA: Diagnosis not present

## 2015-01-03 DIAGNOSIS — Z992 Dependence on renal dialysis: Secondary | ICD-10-CM | POA: Diagnosis not present

## 2015-01-03 DIAGNOSIS — N186 End stage renal disease: Secondary | ICD-10-CM | POA: Diagnosis not present

## 2015-01-03 DIAGNOSIS — I2782 Chronic pulmonary embolism: Secondary | ICD-10-CM | POA: Diagnosis not present

## 2015-01-03 DIAGNOSIS — E1129 Type 2 diabetes mellitus with other diabetic kidney complication: Secondary | ICD-10-CM | POA: Diagnosis not present

## 2015-01-05 DIAGNOSIS — D631 Anemia in chronic kidney disease: Secondary | ICD-10-CM | POA: Diagnosis not present

## 2015-01-05 DIAGNOSIS — N2581 Secondary hyperparathyroidism of renal origin: Secondary | ICD-10-CM | POA: Diagnosis not present

## 2015-01-05 DIAGNOSIS — L0390 Cellulitis, unspecified: Secondary | ICD-10-CM | POA: Diagnosis not present

## 2015-01-05 DIAGNOSIS — T82898D Other specified complication of vascular prosthetic devices, implants and grafts, subsequent encounter: Secondary | ICD-10-CM | POA: Diagnosis not present

## 2015-01-05 DIAGNOSIS — N186 End stage renal disease: Secondary | ICD-10-CM | POA: Diagnosis not present

## 2015-01-07 DIAGNOSIS — D631 Anemia in chronic kidney disease: Secondary | ICD-10-CM | POA: Diagnosis not present

## 2015-01-07 DIAGNOSIS — L0390 Cellulitis, unspecified: Secondary | ICD-10-CM | POA: Diagnosis not present

## 2015-01-07 DIAGNOSIS — T82898D Other specified complication of vascular prosthetic devices, implants and grafts, subsequent encounter: Secondary | ICD-10-CM | POA: Diagnosis not present

## 2015-01-07 DIAGNOSIS — N2581 Secondary hyperparathyroidism of renal origin: Secondary | ICD-10-CM | POA: Diagnosis not present

## 2015-01-07 DIAGNOSIS — N186 End stage renal disease: Secondary | ICD-10-CM | POA: Diagnosis not present

## 2015-01-08 DIAGNOSIS — N2581 Secondary hyperparathyroidism of renal origin: Secondary | ICD-10-CM | POA: Diagnosis not present

## 2015-01-08 DIAGNOSIS — D631 Anemia in chronic kidney disease: Secondary | ICD-10-CM | POA: Diagnosis not present

## 2015-01-08 DIAGNOSIS — L0390 Cellulitis, unspecified: Secondary | ICD-10-CM | POA: Diagnosis not present

## 2015-01-08 DIAGNOSIS — T82898D Other specified complication of vascular prosthetic devices, implants and grafts, subsequent encounter: Secondary | ICD-10-CM | POA: Diagnosis not present

## 2015-01-08 DIAGNOSIS — N186 End stage renal disease: Secondary | ICD-10-CM | POA: Diagnosis not present

## 2015-01-10 DIAGNOSIS — I2782 Chronic pulmonary embolism: Secondary | ICD-10-CM | POA: Diagnosis not present

## 2015-01-10 DIAGNOSIS — T82898D Other specified complication of vascular prosthetic devices, implants and grafts, subsequent encounter: Secondary | ICD-10-CM | POA: Diagnosis not present

## 2015-01-10 DIAGNOSIS — L0390 Cellulitis, unspecified: Secondary | ICD-10-CM | POA: Diagnosis not present

## 2015-01-10 DIAGNOSIS — N186 End stage renal disease: Secondary | ICD-10-CM | POA: Diagnosis not present

## 2015-01-10 DIAGNOSIS — D631 Anemia in chronic kidney disease: Secondary | ICD-10-CM | POA: Diagnosis not present

## 2015-01-10 DIAGNOSIS — N2581 Secondary hyperparathyroidism of renal origin: Secondary | ICD-10-CM | POA: Diagnosis not present

## 2015-01-10 DIAGNOSIS — Z86711 Personal history of pulmonary embolism: Secondary | ICD-10-CM | POA: Diagnosis not present

## 2015-01-12 DIAGNOSIS — L0390 Cellulitis, unspecified: Secondary | ICD-10-CM | POA: Diagnosis not present

## 2015-01-12 DIAGNOSIS — D631 Anemia in chronic kidney disease: Secondary | ICD-10-CM | POA: Diagnosis not present

## 2015-01-12 DIAGNOSIS — N186 End stage renal disease: Secondary | ICD-10-CM | POA: Diagnosis not present

## 2015-01-12 DIAGNOSIS — N2581 Secondary hyperparathyroidism of renal origin: Secondary | ICD-10-CM | POA: Diagnosis not present

## 2015-01-12 DIAGNOSIS — T82898D Other specified complication of vascular prosthetic devices, implants and grafts, subsequent encounter: Secondary | ICD-10-CM | POA: Diagnosis not present

## 2015-01-14 DIAGNOSIS — N2581 Secondary hyperparathyroidism of renal origin: Secondary | ICD-10-CM | POA: Diagnosis not present

## 2015-01-14 DIAGNOSIS — N186 End stage renal disease: Secondary | ICD-10-CM | POA: Diagnosis not present

## 2015-01-14 DIAGNOSIS — D631 Anemia in chronic kidney disease: Secondary | ICD-10-CM | POA: Diagnosis not present

## 2015-01-14 DIAGNOSIS — T82898D Other specified complication of vascular prosthetic devices, implants and grafts, subsequent encounter: Secondary | ICD-10-CM | POA: Diagnosis not present

## 2015-01-14 DIAGNOSIS — L0390 Cellulitis, unspecified: Secondary | ICD-10-CM | POA: Diagnosis not present

## 2015-01-15 ENCOUNTER — Emergency Department (HOSPITAL_COMMUNITY)
Admission: EM | Admit: 2015-01-15 | Discharge: 2015-01-15 | Discharge status: Against Medical Advice | Payer: Medicare Other | Attending: Emergency Medicine | Admitting: Emergency Medicine

## 2015-01-15 ENCOUNTER — Encounter (HOSPITAL_COMMUNITY): Payer: Self-pay

## 2015-01-15 ENCOUNTER — Emergency Department (HOSPITAL_COMMUNITY): Payer: Medicare Other

## 2015-01-15 DIAGNOSIS — G4733 Obstructive sleep apnea (adult) (pediatric): Secondary | ICD-10-CM | POA: Diagnosis not present

## 2015-01-15 DIAGNOSIS — T82898A Other specified complication of vascular prosthetic devices, implants and grafts, initial encounter: Secondary | ICD-10-CM | POA: Diagnosis not present

## 2015-01-15 DIAGNOSIS — Z7952 Long term (current) use of systemic steroids: Secondary | ICD-10-CM | POA: Insufficient documentation

## 2015-01-15 DIAGNOSIS — Z6841 Body Mass Index (BMI) 40.0 and over, adult: Secondary | ICD-10-CM | POA: Insufficient documentation

## 2015-01-15 DIAGNOSIS — Z992 Dependence on renal dialysis: Secondary | ICD-10-CM | POA: Diagnosis not present

## 2015-01-15 DIAGNOSIS — Z7901 Long term (current) use of anticoagulants: Secondary | ICD-10-CM | POA: Insufficient documentation

## 2015-01-15 DIAGNOSIS — Z792 Long term (current) use of antibiotics: Secondary | ICD-10-CM | POA: Insufficient documentation

## 2015-01-15 DIAGNOSIS — J189 Pneumonia, unspecified organism: Secondary | ICD-10-CM | POA: Diagnosis present

## 2015-01-15 DIAGNOSIS — Z794 Long term (current) use of insulin: Secondary | ICD-10-CM | POA: Diagnosis not present

## 2015-01-15 DIAGNOSIS — N186 End stage renal disease: Secondary | ICD-10-CM | POA: Diagnosis not present

## 2015-01-15 DIAGNOSIS — E119 Type 2 diabetes mellitus without complications: Secondary | ICD-10-CM | POA: Insufficient documentation

## 2015-01-15 DIAGNOSIS — T827XXA Infection and inflammatory reaction due to other cardiac and vascular devices, implants and grafts, initial encounter: Secondary | ICD-10-CM

## 2015-01-15 DIAGNOSIS — R05 Cough: Secondary | ICD-10-CM | POA: Diagnosis not present

## 2015-01-15 DIAGNOSIS — I959 Hypotension, unspecified: Secondary | ICD-10-CM | POA: Diagnosis not present

## 2015-01-15 DIAGNOSIS — Z79899 Other long term (current) drug therapy: Secondary | ICD-10-CM | POA: Insufficient documentation

## 2015-01-15 DIAGNOSIS — R509 Fever, unspecified: Secondary | ICD-10-CM | POA: Insufficient documentation

## 2015-01-15 DIAGNOSIS — I120 Hypertensive chronic kidney disease with stage 5 chronic kidney disease or end stage renal disease: Secondary | ICD-10-CM | POA: Insufficient documentation

## 2015-01-15 DIAGNOSIS — D649 Anemia, unspecified: Secondary | ICD-10-CM | POA: Insufficient documentation

## 2015-01-15 LAB — BASIC METABOLIC PANEL
Anion gap: 15 (ref 5–15)
BUN: 57 mg/dL — ABNORMAL HIGH (ref 6–23)
CO2: 25 mmol/L (ref 19–32)
Calcium: 8.6 mg/dL (ref 8.4–10.5)
Chloride: 93 mmol/L — ABNORMAL LOW (ref 96–112)
Creatinine, Ser: 10.22 mg/dL — ABNORMAL HIGH (ref 0.50–1.35)
GFR calc Af Amer: 6 mL/min — ABNORMAL LOW (ref >90)
GFR calc non Af Amer: 5 mL/min — ABNORMAL LOW (ref >90)
Glucose, Bld: 151 mg/dL — ABNORMAL HIGH (ref 70–99)
Potassium: 3.9 mmol/L (ref 3.5–5.1)
Sodium: 133 mmol/L — ABNORMAL LOW (ref 135–145)

## 2015-01-15 LAB — CBC WITH DIFFERENTIAL/PLATELET
Basophils Absolute: 0.1 10*3/uL (ref 0.0–0.1)
Basophils Relative: 1 % (ref 0–1)
Eosinophils Absolute: 0.1 10*3/uL (ref 0.0–0.7)
Eosinophils Relative: 0 % (ref 0–5)
HCT: 33.1 % — ABNORMAL LOW (ref 39.0–52.0)
Hemoglobin: 10.8 g/dL — ABNORMAL LOW (ref 13.0–17.0)
Lymphocytes Relative: 12 % (ref 12–46)
Lymphs Abs: 1.5 10*3/uL (ref 0.7–4.0)
MCH: 35.3 pg — ABNORMAL HIGH (ref 26.0–34.0)
MCHC: 32.6 g/dL (ref 30.0–36.0)
MCV: 108.2 fL — ABNORMAL HIGH (ref 78.0–100.0)
Monocytes Absolute: 1 10*3/uL (ref 0.1–1.0)
Monocytes Relative: 8 % (ref 3–12)
Neutro Abs: 10.7 10*3/uL — ABNORMAL HIGH (ref 1.7–7.7)
Neutrophils Relative %: 79 % — ABNORMAL HIGH (ref 43–77)
Platelets: 232 10*3/uL (ref 150–400)
RBC: 3.06 MIL/uL — ABNORMAL LOW (ref 4.22–5.81)
RDW: 14.5 % (ref 11.5–15.5)
WBC: 13.3 10*3/uL — ABNORMAL HIGH (ref 4.0–10.5)

## 2015-01-15 LAB — LACTIC ACID, PLASMA: Lactic Acid, Venous: 1.5 mmol/L (ref 0.5–2.0)

## 2015-01-15 LAB — PROTIME-INR
INR: 1.53 — ABNORMAL HIGH (ref 0.00–1.49)
Prothrombin Time: 18.5 seconds — ABNORMAL HIGH (ref 11.6–15.2)

## 2015-01-15 MED ORDER — DEXTROSE 5 % IV SOLN: 2.0000 g | INTRAVENOUS | Status: DC

## 2015-01-15 MED ORDER — VANCOMYCIN HCL 10 G IV SOLR
2500.0000 mg | Freq: Once | INTRAVENOUS | Status: AC
  Administered 2015-01-15: 2500 mg via INTRAVENOUS
  Filled 2015-01-15: qty 2500

## 2015-01-15 MED ORDER — LEVOFLOXACIN 500 MG PO TABS
500.0000 mg | ORAL_TABLET | ORAL | Status: DC
  Filled 2015-01-15 (×2): qty 1

## 2015-01-15 MED ORDER — SODIUM CHLORIDE 0.9 % IV BOLUS (SEPSIS): 1000.0000 mL | Freq: Once | INTRAVENOUS | Status: DC

## 2015-01-15 MED ORDER — ACETAMINOPHEN 325 MG PO TABS: 325.0000 mg | ORAL_TABLET | Freq: Once | ORAL | Status: DC

## 2015-01-15 MED ORDER — DEXTROSE 5 % IV SOLN
2.0000 g | Freq: Once | INTRAVENOUS | Status: AC
  Administered 2015-01-15: 2 g via INTRAVENOUS
  Filled 2015-01-15: qty 2

## 2015-01-15 MED ORDER — ACETAMINOPHEN 325 MG PO TABS
650.0000 mg | ORAL_TABLET | Freq: Once | ORAL | Status: AC
  Administered 2015-01-15: 650 mg via ORAL
  Filled 2015-01-15: qty 2

## 2015-01-15 NOTE — ED Notes (Signed)
Pt  Here for fever and weakness, pt is dilaysis, was to receive treatment today and did not due to symptoms, pt had diaylsys Saturday and Monday. Pt fever was 103, received tylenol by PTAR.

## 2015-01-15 NOTE — ED Notes (Signed)
Reminded pt. About sitting on the edge of the bed and risk of falling out.  Pt. Stated, "I am okay."  Wife at the bedside and will watch pt. While he is sitting on the edge of the bed.  Reported to Dr. Juleen ChinaKohut.

## 2015-01-15 NOTE — ED Notes (Signed)
Pt refused to get fully into bed. Pt is sitting on the side of the bed with saturations around the 70's. Lawson FiscalLori, RN in room. Pt refused nasal cannula, until explained to pt that it was best for his health to wear the nasal cannula, O2 stats raised to 99%. Pt continuously swaying back and forth on side of bed while falling asleep. States he has sleep apnea. Pt made aware that if he does not get into bed that he will more than likely fall out of the bed and it would be dangerous to his health. Charge nurse Lequita HaltMorgan, RN made aware as well.

## 2015-01-15 NOTE — ED Notes (Signed)
Spoke with Dr.Kohut about drawing the lactic Acid.  Must stop the VAncomycin for 30 minutes and then draw the lab.  Dr. KohutJuleen China agreed and gave us an order to stop the medication for 30 minutes

## 2015-01-15 NOTE — Progress Notes (Signed)
Called by Dr. Juleen ChinaKohut to admit patient with sepsis, fever, tachycardia and a possible infected left arm fistula. Patient refusing admission and decided to leave AMA. Discussed with nephrology and made them aware that he will need antibiotics at his HD today.  [name], MD [facility] [phone]

## 2015-01-15 NOTE — Consult Note (Signed)
Hospital Consult    Reason for Consult:  Fever and redness around fistula Referring Physician:  Nephrolopgy MRN #:  [id number]  History of Present Illness: This is a 49 y.o. male who had a left BVT placed in 2011 by Dr. Hart RochesterLawson.  He has had maintenance work on this in the past year requiring a de-clot.  He presented to his HD center today and the workers there noticed a change in the area over his fistula and he was sent to the ER.  He did have a reported fever.  CXR also revealed possible PNA.  He is sent to the ER for further evaluation.  He has not had any trouble with accessing his fistula.    Past Medical History  Diagnosis Date  . Diabetes mellitus     diabetic ketoacidosis  . Morbid obesity   . OSA (obstructive sleep apnea)   . Septic shock(785.52)   . Anemia   . Hypertension     Past Surgical History  Procedure Laterality Date  . Av fistula placement      Allergies  Allergen Reactions  . Aspirin Other (See Comments)    Stomach problems    Prior to Admission medications   Medication Sig Start Date End Date Taking? Authorizing Provider  allopurinol (ZYLOPRIM) 300 MG tablet Take 300 mg by mouth 2 (two) times daily.   Yes Historical Provider, MD  calcium acetate (PHOSLO) 667 MG capsule Take 2,001 mg by mouth 3 (three) times daily with meals.    Yes Historical Provider, MD  cinacalcet (SENSIPAR) 60 MG tablet Take 60 mg by mouth daily.   Yes Historical Provider, MD  colchicine 0.6 MG tablet Take 0.6 mg by mouth daily.   Yes Historical Provider, MD  folic acid-vitamin b complex-vitamin c-selenium-zinc (DIALYVITE) 3 MG TABS Take 1 tablet by mouth daily.     Yes Historical Provider, MD  gabapentin (NEURONTIN) 300 MG capsule Take 300 mg by mouth every 8 (eight) hours.   Yes Historical Provider, MD  insulin aspart protamine-insulin aspart (NOVOLOG 70/30) (70-30) 100 UNIT/ML injection Inject 35 Units into the skin 3 (three) times daily with meals.    Yes Historical Provider, MD    insulin glargine (LANTUS) 100 UNIT/ML injection Inject 70 Units into the skin at bedtime.    Yes Historical Provider, MD  omeprazole (PRILOSEC) 20 MG capsule Take 20 mg by mouth daily.     Yes Historical Provider, MD  warfarin (COUMADIN) 7.5 MG tablet Take 7.5 mg by mouth as directed.    Yes Historical Provider, MD  AMLODIPINE BESYLATE PO Take 5 mg by mouth daily. Call pharmacy to verify dosage    Historical Provider, MD  tobramycin-dexamethasone [facility](TOBRADEX) ophthalmic solution Place 1 drop into the right eye 2 (two) times daily.    Historical Provider, MD  warfarin (COUMADIN) 10 MG tablet Take 10 mg by mouth 2 (two) times a week. Monday & Friday.    Historical Provider, MD    History   Social History  . Marital Status: Married    Spouse Name: N/A  . Number of Children: N/A  . Years of Education: N/A   Occupational History  . disabled     Social History Main Topics  . Smoking status: Never Smoker   . Smokeless tobacco: Never Used  . Alcohol Use: Yes     Comment: Occasional wine use  . Drug Use: No  . Sexual Activity: Not on file   Other Topics Concern  . Not on file  Social History Narrative     History reviewed. No pertinent family history.  ROS:  Positive    Negative    All sytems reviewed and are negative  Cardiovascular:  chest pain/pressure  palpitations  SOB lying flat  DOE  pain in legs while walking  pain in legs at rest  pain in legs at night  non-healing ulcers  hx of DVT  swelling in legs  Pulmonary:  productive cough  asthma/wheezing  home O2  sleep apnea  Neurologic:  weakness in  arms  legs  numbness in  arms  legs  hx of CVA  mini stroke difficulty speaking or slurred speech  temporary loss of vision in one eye  dizziness  Hematologic:  hx of cancer  bleeding problems  problems with blood clotting easily  hx anemia  Endocrine:    diabetes  thyroid  disease  GI  vomiting blood  blood in stool  GU:  CKD/renal failure  HD--[X]  M/T/T/S or  T/T/S  burning with urination  blood in urine  Psychiatric:  anxiety  depression  Musculoskeletal:  arthritis  joint pain  Integumentary:  rashes  ulcers  Constitutional:  fever  chills   Physical Examination  Filed Vitals:   01/15/15 1201  BP:   Pulse:   Temp: 101.5 F (38.6 C)  Resp:    Body mass index is 69.35 kg/(m^2).  General:  WDWN morbidly obese in NAD Gait: Not observed HENT: WNL, normocephalic Pulmonary: normal non-labored breathing Abdomen: soft, NT/ND, no masses Skin: without rashes, without ulcers  Vascular Exam/Pulses: +thrill/bruit within fistula; mild erythema over access site; warm to touch Extremities: without ischemic changes, without Gangrene , without cellulitis; without open wounds;  Musculoskeletal: no muscle wasting or atrophy  Neurologic: A&O X 3; Appropriate Affect ; SENSATION: normal; MOTOR FUNCTION:  moving all extremities equally. Speech is fluent/normal   CBC    Component Value Date/Time   WBC 13.3* 01/15/2015 0800   RBC 3.06* 01/15/2015 0800   HGB 10.8* 01/15/2015 0800   HCT 33.1* 01/15/2015 0800   PLT 232 01/15/2015 0800   MCV 108.2* 01/15/2015 0800   MCH 35.3* 01/15/2015 0800   MCHC 32.6 01/15/2015 0800   RDW 14.5 01/15/2015 0800   LYMPHSABS 1.5 01/15/2015 0800   MONOABS 1.0 01/15/2015 0800   EOSABS 0.1 01/15/2015 0800   BASOSABS 0.1 01/15/2015 0800    BMET    Component Value Date/Time   NA 133* 01/15/2015 0800   K 3.9 01/15/2015 0800   CL 93* 01/15/2015 0800   CO2 25 01/15/2015 0800   GLUCOSE 151* 01/15/2015 0800   BUN 57* 01/15/2015 0800   CREATININE 10.22* 01/15/2015 0800   CALCIUM 8.6 01/15/2015 0800   GFRNONAA 5* 01/15/2015 0800   GFRAA 6* 01/15/2015 0800    COAGS: Lab Results  Component Value Date   INR 1.53* 01/15/2015   INR 1.71* 06/13/2014   INR 5.12* 03/06/2014      Non-Invasive Vascular Imaging:   none  Statin:  No. Beta Blocker:  No. Aspirin:  No. ACEI:  No. ARB:  No. Other antiplatelets/anticoagulants:  Yes.   coumadin    ASSESSMENT/PLAN: This is a 49 y.o. male with possible cellulitis of left upper arm fistula (BVT placed in 2011).   -pt may have some mild erythema over left upper arm fistula with recent fever.  Pt also has cxr, which is suspicious for fever.  He has received a dose of ABx here in the ER.  He  will receive 2 weeks of ABx at his access center during HD for possible mild cellulitis over fistula as well as possible PNA.  Renal will coordinate this with his HD center.   Doreatha Massed, PA-C [facility] [phone]  Agree with above. The patient has some mild cellulitis overlying the fistula. There are no wounds. There is moderate swelling in the central part of the fistula but this is not new. I would recommend intravenous antibiotics at the time of dialysis for 2 weeks. If the fistula is not working well and he would need a fistulogram to look for correctable problems. However, according to the patient the fistula currently is working well.  Waverly Ferrari, MD, FACS Beeper [phone]

## 2015-01-15 NOTE — Progress Notes (Signed)
ANTIBIOTIC CONSULT NOTE - INITIAL  Pharmacy Consult:  Cefepime Indication:  HCAP  Allergies  Allergen Reactions  . Aspirin Other (See Comments)    Stomach problems    Patient Measurements: Height: 5\' 11"  (180.3 cm) Weight: (!) 497 lb (225.438 kg) IBW/kg (Calculated) : 75.3  Vital Signs: Temp: 102.2 F (39 C) (04/12 0703) Temp Source: Oral (04/12 0703) BP: 87/68 mmHg (04/12 0832) Pulse Rate: 118 (04/12 0832)  Labs:  Recent Labs  01/15/15 0800  WBC 13.3*  HGB 10.8*  PLT 232   CrCl cannot be calculated (Patient has no serum creatinine result on file.). No results for input(s): VANCOTROUGH, VANCOPEAK, VANCORANDOM, GENTTROUGH, GENTPEAK, GENTRANDOM, TOBRATROUGH, TOBRAPEAK, TOBRARND, AMIKACINPEAK, AMIKACINTROU, AMIKACIN in the last 72 hours.   Microbiology: No results found for this or any previous visit (from the past 720 hour(s)).  Medical History: Past Medical History  Diagnosis Date  . Diabetes mellitus     diabetic ketoacidosis  . Morbid obesity   . OSA (obstructive sleep apnea)   . Septic shock(785.52)   . Anemia   . Hypertension      Assessment: [age] YOM presented with fever and weakness.  Pharmacy consulted to manage cefepime for HCAP.  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  Noted vancomycin load was ordered and given.   Goal of Therapy:  Resolution of infection   Plan:  - Cefepime 2gm IV x 1 now, then 1gm IV q-HD MTTS if patient does not leave AMA - Vanc 2500mg  IV x 1 now per MD - F/U HD schedule/tolerance, clinical progress - F/U continuation of vanc and Coumadin if admitted    Gilda Abboud D. Laney Potashang, PharmD, BCPS Pager:  [phone] 01/15/2015, 12:29 PM

## 2015-01-15 NOTE — ED Notes (Signed)
Ladona Ridgelaylor, NT to move patient to Pod C.

## 2015-01-15 NOTE — ED Notes (Signed)
Restarted pt.s Vancomycin per Dr. Juleen ChinaKohut.  Lactic Acid has been drawn.

## 2015-01-15 NOTE — ED Provider Notes (Signed)
CSN: [id number]     Arrival date & time 01/15/15  2841 History   First MD Initiated Contact with Patient 01/15/15 0703     Chief Complaint  Patient presents with  . Fever     (Consider location/radiation/quality/duration/timing/severity/associated sxs/prior Treatment) HPI   49 year old male referred from dialysis for evaluation of fever. Patient states in the past day has felt fatigued and had subjective fever.  Patient is on dialysis Monday, Tuesday, Thursday and Saturday. He did not get dialysis today. He reports some swelling and mild pain in left upper extremity fistula. No acute complaints otherwise. He does not make a substantial amount of urine. No respiratory complaints. No nausea or vomiting.   Past Medical History  Diagnosis Date  . Diabetes mellitus     diabetic ketoacidosis  . Morbid obesity   . OSA (obstructive sleep apnea)   . Septic shock(785.52)   . Anemia   . Hypertension    Past Surgical History  Procedure Laterality Date  . Av fistula placement     History reviewed. No pertinent family history. History  Substance Use Topics  . Smoking status: Never Smoker   . Smokeless tobacco: Never Used  . Alcohol Use: Yes     Comment: Occasional wine use    Review of Systems  All systems reviewed and negative, other than as noted in HPI.   Allergies  Aspirin  Home Medications   Prior to Admission medications   Medication Sig Start Date End Date Taking? Authorizing Provider  allopurinol (ZYLOPRIM) 300 MG tablet Take 300 mg by mouth 2 (two) times daily.   Yes Historical Provider, MD  calcium acetate (PHOSLO) 667 MG capsule Take 2,001 mg by mouth 3 (three) times daily with meals.    Yes Historical Provider, MD  cinacalcet (SENSIPAR) 60 MG tablet Take 60 mg by mouth daily.   Yes Historical Provider, MD  colchicine 0.6 MG tablet Take 0.6 mg by mouth daily.   Yes Historical Provider, MD  folic acid-vitamin b complex-vitamin c-selenium-zinc (DIALYVITE) 3 MG TABS  Take 1 tablet by mouth daily.     Yes Historical Provider, MD  gabapentin (NEURONTIN) 300 MG capsule Take 300 mg by mouth every 8 (eight) hours.   Yes Historical Provider, MD  insulin aspart protamine-insulin aspart (NOVOLOG 70/30) (70-30) 100 UNIT/ML injection Inject 35 Units into the skin 3 (three) times daily with meals.    Yes Historical Provider, MD  insulin glargine (LANTUS) 100 UNIT/ML injection Inject 70 Units into the skin at bedtime.    Yes Historical Provider, MD  omeprazole (PRILOSEC) 20 MG capsule Take 20 mg by mouth daily.     Yes Historical Provider, MD  warfarin (COUMADIN) 7.5 MG tablet Take 7.5 mg by mouth as directed.    Yes Historical Provider, MD  AMLODIPINE BESYLATE PO Take 5 mg by mouth daily. Call pharmacy to verify dosage    Historical Provider, MD  tobramycin-dexamethasone [facility]) ophthalmic solution Place 1 drop into the right eye 2 (two) times daily.    Historical Provider, MD  warfarin (COUMADIN) 10 MG tablet Take 10 mg by mouth 2 (two) times a week. Monday & Friday.    Historical Provider, MD   BP 87/68 mmHg  Pulse 118  Temp(Src) 102.2 F (39 C) (Oral)  Resp 20  Ht  (1.803 m)  Wt 497 lb (225.438 kg)  BMI 69.35 kg/m2  SpO2 94% Physical Exam  Constitutional: He is oriented to person, place, and time. No distress.  Sitting on  side of bed. Morbidly obese.   HENT:  Head: Normocephalic and atraumatic.  Eyes: Conjunctivae are normal. Right eye exhibits no discharge. Left eye exhibits no discharge.  Neck: Neck supple.  Cardiovascular: Regular rhythm and normal heart sounds.  Exam reveals no gallop and no friction rub.   No murmur heard. AV fistula LUE with erythema and increased warmth. Mildly TTP. No discrete abscess or drainage. Palpable thrill. Tachycardic  Pulmonary/Chest: Effort normal and breath sounds normal. No respiratory distress.  Abdominal: Soft. He exhibits no distension. There is no tenderness.  Musculoskeletal: He exhibits no edema or  tenderness.  Neurological: He is alert and oriented to person, place, and time.  Skin: Skin is warm and dry.  Psychiatric: He has a normal mood and affect. His behavior is normal. Thought content normal.  Nursing note and vitals reviewed.   ED Course  Procedures (including critical care time) Labs Review Labs Reviewed  CBC WITH DIFFERENTIAL/PLATELET - Abnormal; Notable for the following:    WBC 13.3 (*)    RBC 3.06 (*)    Hemoglobin 10.8 (*)    HCT 33.1 (*)    MCV 108.2 (*)    MCH 35.3 (*)    Neutrophils Relative % 79 (*)    Neutro Abs 10.7 (*)    All other components within normal limits  BASIC METABOLIC PANEL - Abnormal; Notable for the following:    Sodium 133 (*)    Chloride 93 (*)    Glucose, Bld 151 (*)    BUN 57 (*)    Creatinine, Ser 10.22 (*)    GFR calc non Af Amer 5 (*)    GFR calc Af Amer 6 (*)    All other components within normal limits  CULTURE, BLOOD (ROUTINE X 2)  CULTURE, BLOOD (ROUTINE X 2)  LACTIC ACID, PLASMA  LACTIC ACID, PLASMA    Imaging Review Dg Chest Portable 1 View  01/15/2015   CLINICAL DATA:  Cough, low-grade fever.  EXAM: PORTABLE CHEST - 1 VIEW  COMPARISON:  None.  FINDINGS: Mild cardiomegaly is noted. No pneumothorax or pleural effusion is noted. Left lung is clear. Mild right perihilar opacity is noted concerning for edema or possibly inflammation. Bony thorax is intact.  IMPRESSION: Mild right perihilar opacity is noted concerning for edema or possibly inflammation. Followup radiographs are recommended.   Electronically Signed   By: Lupita RaiderJames  Green Jr, M.D.   On: 01/15/2015 07:27     EKG Interpretation None      MDM   Final diagnoses:  HCAP (healthcare-associated pneumonia)    48yM with fever. Initially normotensive. Repeat BP with hypotension. Pt is morbidly obese though and I'm not sure how reliable this reading actually is. Will check manual. He does not appear toxic. Pt weighs 500 lbs. 30cc/kg bolus of IVF is 6.7L in addition to  IVF with abx/meds. ESRD and missed dialysis today. CXR questionable for edema. Pt becomes hypoxic simply with laying back in bed and body habitus may make for difficult intubation. I do not feel strongly he needs large amount of fluid resuscitation at this time. Lactic acid is pending. Unless this is significantly elevated and/or hypotension persists, will defer from such massive initial bolus. Will continue to monitor closely.  Vancomycin empirically ordered initially. Cefepime added for HCAP in patient that is regularly in/out of dialysis cenetr. AVF L upper arm is somewhat erythematous and warm to touch. Pt reports some pain, but not really TTP. No palpable abscess.   Manual BP 119/70.  Pt refusing admission. Discussed my concerns and recommendation for admission. Possible pneumonia, but no respiratory complaints. LUE fistula looks angry as well and suspect more likely source. Pt is agreeable to finish dosing of IV abx in ED. Vancomycin may potentially be able to be further dosed at dialysis, but this needs to be arranged through his center. Dr Lafe Garin, hospitalist, was initially consulted for admission but pt still declining.  Tried to address any concerns aside from simply not wanting to be in hospital. Pt said he had none. I feel he is being unreasonable, but he has medical decision making capability. He understands benefits of hospitalization and risks of leaving including, but not limited to, worsening condition, permanent disability or death. He will be discharged AMA per his wishes. Nephrology PA also to speak with pt and may be able to facilitate abx through dialysis center.  If not, will DC with PO abx.   Raeford Razor, MD 01/15/15 1214

## 2015-01-15 NOTE — ED Notes (Signed)
NAD at this time. Pt is determined to leave AMA.

## 2015-01-15 NOTE — ED Notes (Signed)
Patient states, " I am not staying. I will not stay over night and I told the MD that." Spoke with Internal Medicine and he stated patient would have to sign out AMA. Patient states, "I want to get all my antibiotics and then I will sign out AMA." Marchelle FolksAmanda, RN made aware.

## 2015-01-16 ENCOUNTER — Telehealth (HOSPITAL_BASED_OUTPATIENT_CLINIC_OR_DEPARTMENT_OTHER): Payer: Self-pay | Admitting: Emergency Medicine

## 2015-01-16 DIAGNOSIS — N186 End stage renal disease: Secondary | ICD-10-CM | POA: Diagnosis not present

## 2015-01-16 DIAGNOSIS — L0390 Cellulitis, unspecified: Secondary | ICD-10-CM | POA: Diagnosis not present

## 2015-01-16 DIAGNOSIS — D631 Anemia in chronic kidney disease: Secondary | ICD-10-CM | POA: Diagnosis not present

## 2015-01-16 DIAGNOSIS — N2581 Secondary hyperparathyroidism of renal origin: Secondary | ICD-10-CM | POA: Diagnosis not present

## 2015-01-16 DIAGNOSIS — T82898D Other specified complication of vascular prosthetic devices, implants and grafts, subsequent encounter: Secondary | ICD-10-CM | POA: Diagnosis not present

## 2015-01-17 ENCOUNTER — Telehealth (HOSPITAL_BASED_OUTPATIENT_CLINIC_OR_DEPARTMENT_OTHER): Payer: Self-pay | Admitting: Emergency Medicine

## 2015-01-17 DIAGNOSIS — N186 End stage renal disease: Secondary | ICD-10-CM | POA: Diagnosis not present

## 2015-01-17 DIAGNOSIS — N2581 Secondary hyperparathyroidism of renal origin: Secondary | ICD-10-CM | POA: Diagnosis not present

## 2015-01-17 DIAGNOSIS — T82898D Other specified complication of vascular prosthetic devices, implants and grafts, subsequent encounter: Secondary | ICD-10-CM | POA: Diagnosis not present

## 2015-01-17 DIAGNOSIS — I2782 Chronic pulmonary embolism: Secondary | ICD-10-CM | POA: Diagnosis not present

## 2015-01-17 DIAGNOSIS — D631 Anemia in chronic kidney disease: Secondary | ICD-10-CM | POA: Diagnosis not present

## 2015-01-17 DIAGNOSIS — L0390 Cellulitis, unspecified: Secondary | ICD-10-CM | POA: Diagnosis not present

## 2015-01-17 DIAGNOSIS — Z86711 Personal history of pulmonary embolism: Secondary | ICD-10-CM | POA: Diagnosis not present

## 2015-01-18 LAB — CULTURE, BLOOD (ROUTINE X 2)

## 2015-01-18 NOTE — Progress Notes (Signed)
ED Antimicrobial Stewardship Positive Culture Follow Up   Shane Hamilton is an 49 y.o. male who presented to [facility] with a chief complaint of  Chief Complaint  Patient presents with  . Fever    Recent Results (from the past 720 hour(s))  Blood culture (routine x 2)     Status: None   Collection Time: 01/15/15  8:00 AM  Result Value Ref Range Status   Specimen Description BLOOD RIGHT HAND  Final   Special Requests BOTTLES DRAWN AEROBIC AND ANAEROBIC 5ML  Final   Culture   Final    STAPHYLOCOCCUS AUREUS Note: SUSCEPTIBILITIES PERFORMED ON PREVIOUS CULTURE WITHIN THE LAST 5 DAYS. Note: Gram Stain Report Called to,Read Back By and Verified With: Shane Hamilton 01/16/15 AT 0420 RIDK Performed at [facility]    Report Status 01/18/2015 FINAL  Final  Blood culture (routine x 2)     Status: None   Collection Time: 01/15/15  8:15 AM  Result Value Ref Range Status   Specimen Description BLOOD RIGHT ARM  Final   Special Requests BOTTLES DRAWN AEROBIC AND ANAEROBIC 5ML  Final   Culture   Final    STAPHYLOCOCCUS AUREUS Note: RIFAMPIN AND GENTAMICIN SHOULD NOT BE USED AS SINGLE DRUGS FOR TREATMENT OF STAPH INFECTIONS. Note: Gram Stain Report Called to,Read Back By and Verified With: Shane Hamilton 01/16/15 AT 0420 RIDK Performed at [facility]    Report Status 01/18/2015 FINAL  Final   Organism ID, Bacteria STAPHYLOCOCCUS AUREUS  Final      Susceptibility   Staphylococcus aureus - MIC*    CLINDAMYCIN <=0.25 SENSITIVE Sensitive     ERYTHROMYCIN <=0.25 SENSITIVE Sensitive     GENTAMICIN <=0.5 SENSITIVE Sensitive     LEVOFLOXACIN 0.25 SENSITIVE Sensitive     OXACILLIN <=0.25 SENSITIVE Sensitive     PENICILLIN >=0.5 RESISTANT Resistant     RIFAMPIN <=0.5 SENSITIVE Sensitive     TRIMETH/SULFA <=10 SENSITIVE Sensitive     VANCOMYCIN 1 SENSITIVE Sensitive     TETRACYCLINE >=16 RESISTANT Resistant     MOXIFLOXACIN <=0.25 SENSITIVE Sensitive     *  STAPHYLOCOCCUS AUREUS    Blood cultures have finalized and show MSSA.  Patient has refused hospital admission and prefers antibiotic therapy with dialysis.  [facility] [phone]).  I spoke to DrexelAdrianne, Charity fundraiserN.  I also spoke with Dr. Gwen HerKees Van Dam from infectious diseases who recommends cefazolin treatment for 6 weeks and also repeating blood cultures to ensure clearance.  Shane SkinnerFrens, Shane Hamilton Shane Hamilton 01/18/2015, 9:30 AM Infectious Diseases Pharmacist Phone# [phone]

## 2015-01-19 DIAGNOSIS — D631 Anemia in chronic kidney disease: Secondary | ICD-10-CM | POA: Diagnosis not present

## 2015-01-19 DIAGNOSIS — T82898D Other specified complication of vascular prosthetic devices, implants and grafts, subsequent encounter: Secondary | ICD-10-CM | POA: Diagnosis not present

## 2015-01-19 DIAGNOSIS — N2581 Secondary hyperparathyroidism of renal origin: Secondary | ICD-10-CM | POA: Diagnosis not present

## 2015-01-19 DIAGNOSIS — L0390 Cellulitis, unspecified: Secondary | ICD-10-CM | POA: Diagnosis not present

## 2015-01-19 DIAGNOSIS — N186 End stage renal disease: Secondary | ICD-10-CM | POA: Diagnosis not present

## 2015-01-21 ENCOUNTER — Ambulatory Visit (INDEPENDENT_AMBULATORY_CARE_PROVIDER_SITE_OTHER): Payer: Medicare Other | Admitting: Family

## 2015-01-21 ENCOUNTER — Encounter: Payer: Self-pay | Admitting: Family

## 2015-01-21 ENCOUNTER — Telehealth: Payer: Self-pay

## 2015-01-21 VITALS — HR 106 | Temp 98.9°F | Resp 18 | Ht 71.0 in | Wt >= 6400 oz

## 2015-01-21 DIAGNOSIS — I770 Arteriovenous fistula, acquired: Secondary | ICD-10-CM

## 2015-01-21 DIAGNOSIS — N2581 Secondary hyperparathyroidism of renal origin: Secondary | ICD-10-CM | POA: Diagnosis not present

## 2015-01-21 DIAGNOSIS — N186 End stage renal disease: Secondary | ICD-10-CM | POA: Diagnosis not present

## 2015-01-21 DIAGNOSIS — T82898D Other specified complication of vascular prosthetic devices, implants and grafts, subsequent encounter: Secondary | ICD-10-CM | POA: Diagnosis not present

## 2015-01-21 DIAGNOSIS — Z992 Dependence on renal dialysis: Secondary | ICD-10-CM | POA: Diagnosis not present

## 2015-01-21 DIAGNOSIS — D631 Anemia in chronic kidney disease: Secondary | ICD-10-CM | POA: Diagnosis not present

## 2015-01-21 DIAGNOSIS — L0390 Cellulitis, unspecified: Secondary | ICD-10-CM | POA: Diagnosis not present
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
## 2015-01-21 NOTE — Telephone Encounter (Signed)
phone call [facility] with report of worsening at the left arm access site with redness and swelling.  Reported pt. was sent to the ER on 4/12 due to signs of infection, and was started on IV antibiotic with HD tx.  Stated the site looks worse; reported positive blood cultures.  Stated Dr. Lowell GuitarPowell is requesting pt. be seen today or tomorrow.  Reported pt. Will be on treatment until approx. 1:00 PM today.  Appt. given today at 2:30 PM with NP.   Nurse will notify pt. Of appt.

## 2015-01-22 DIAGNOSIS — D631 Anemia in chronic kidney disease: Secondary | ICD-10-CM | POA: Diagnosis not present

## 2015-01-22 DIAGNOSIS — T82898D Other specified complication of vascular prosthetic devices, implants and grafts, subsequent encounter: Secondary | ICD-10-CM | POA: Diagnosis not present

## 2015-01-22 DIAGNOSIS — N186 End stage renal disease: Secondary | ICD-10-CM | POA: Diagnosis not present

## 2015-01-22 DIAGNOSIS — L0390 Cellulitis, unspecified: Secondary | ICD-10-CM | POA: Diagnosis not present

## 2015-01-22 DIAGNOSIS — N2581 Secondary hyperparathyroidism of renal origin: Secondary | ICD-10-CM | POA: Diagnosis not present

## 2015-01-23 DIAGNOSIS — N2581 Secondary hyperparathyroidism of renal origin: Secondary | ICD-10-CM | POA: Diagnosis not present

## 2015-01-23 DIAGNOSIS — T82898D Other specified complication of vascular prosthetic devices, implants and grafts, subsequent encounter: Secondary | ICD-10-CM | POA: Diagnosis not present

## 2015-01-23 DIAGNOSIS — N186 End stage renal disease: Secondary | ICD-10-CM | POA: Diagnosis not present

## 2015-01-23 DIAGNOSIS — L0390 Cellulitis, unspecified: Secondary | ICD-10-CM | POA: Diagnosis not present

## 2015-01-23 DIAGNOSIS — D631 Anemia in chronic kidney disease: Secondary | ICD-10-CM | POA: Diagnosis not present

## 2015-01-24 DIAGNOSIS — N186 End stage renal disease: Secondary | ICD-10-CM | POA: Diagnosis not present

## 2015-01-24 DIAGNOSIS — L0390 Cellulitis, unspecified: Secondary | ICD-10-CM | POA: Diagnosis not present

## 2015-01-24 DIAGNOSIS — T82898D Other specified complication of vascular prosthetic devices, implants and grafts, subsequent encounter: Secondary | ICD-10-CM | POA: Diagnosis not present

## 2015-01-24 DIAGNOSIS — I2782 Chronic pulmonary embolism: Secondary | ICD-10-CM | POA: Diagnosis not present

## 2015-01-24 DIAGNOSIS — N2581 Secondary hyperparathyroidism of renal origin: Secondary | ICD-10-CM | POA: Diagnosis not present

## 2015-01-24 DIAGNOSIS — D631 Anemia in chronic kidney disease: Secondary | ICD-10-CM | POA: Diagnosis not present

## 2015-01-24 DIAGNOSIS — Z86711 Personal history of pulmonary embolism: Secondary | ICD-10-CM | POA: Diagnosis not present

## 2015-01-26 DIAGNOSIS — D631 Anemia in chronic kidney disease: Secondary | ICD-10-CM | POA: Diagnosis not present

## 2015-01-26 DIAGNOSIS — L0390 Cellulitis, unspecified: Secondary | ICD-10-CM | POA: Diagnosis not present

## 2015-01-26 DIAGNOSIS — N2581 Secondary hyperparathyroidism of renal origin: Secondary | ICD-10-CM | POA: Diagnosis not present

## 2015-01-26 DIAGNOSIS — T82898D Other specified complication of vascular prosthetic devices, implants and grafts, subsequent encounter: Secondary | ICD-10-CM | POA: Diagnosis not present

## 2015-01-26 DIAGNOSIS — N186 End stage renal disease: Secondary | ICD-10-CM | POA: Diagnosis not present

## 2015-01-26 NOTE — Op Note (Signed)
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   PROCEDURES PERFORMED:  1.  Open thrombectomy, left arm basilic transposition.  2.  Percutaneous transluminal angioplasty and stent placement, left arm basilic transposition arteriovenous fistula.   SURGEON: Levora DredgeGregory Fawnda Vitullo, M.D.   ANESTHESIA: MAC.   ESTIMATED BLOOD LOSS: 100 mL.   SPECIMEN: Thrombus to pathology for permanent section.   INDICATIONS: Mr. Shane Hamilton is a 49 year old gentleman with multiple medical problems who presents with thrombosis of his left arm AV fistula. Attempts at [place] an [place] in [place] were unsuccessful, and the patient has been referred to [facility] for further treatment. Risks and benefits were reviewed. All questions answered. The patient has agreed to proceed.   DESCRIPTION OF PROCEDURE: The patient is taken to the Operating Room and placed in the supine position. After adequate sedation has been achieved, his left arm is prepped and draped in a sterile fashion. Working under fluoroscopy with the C-arm unit, as well as using ultrasound, the course of the fistula is mapped with the ultrasound.  A linear incision is created just above the anastomosis down by the antecubital fossa, and carried down through the soft tissues.  The fistula itself was identified and then looped proximally and distally with Silastic vessel loops. Heparin 4000 units is given. The fistula is clearly thrombosed.   A transverse incision is created in the fistula itself. Blood is noted at the incision site. Using a combination of #3 Fogarty and #4 Fogarty, a significant amount of thrombus is cleared from the venous portion of the fistula. After several passes, the arterial portion is  cleared, and there is now return of excellent in flow. It is noted on attempting to pass the catheter under fluoroscopy, that the catheter is hanging up in the more proximal aneurysmal area. Because ultimately, I was not able to clear a thrombus completely from the axilla down to the incision via this incision, another incision is created overlying the fistula itself more proximally at the level of the deltoid. The  dissection is carried down through the soft tissues. The fistula is identified. Another transverse incision is created within the vessel and a thrombus is cleared both proximally and distally through this level as well. Ultimately, a Trerotola device was opened onto the field and also used to help clear thrombus. Significant more amount of thrombus was retrieved and passed off the field as specimen.   Unfortunately, it was clear from some contrast injections that the fistula still had a very large amount of thrombus particularly within the 2 aneurysmal areas. There is also a proximal 60%-70% in-stent restenosis noted at the axillary level and a greater than 80%-90% stenosis just above the aneurysmal portion.   Given this constellation of findings, it did not appear that simple thrombectomy would yield a durable reconstruction and, therefore, it was elected to place Viabahn stents, essentially from the axillary vein down to the more distal arm just above the initial incision into the fistula. In order to do this, a total of 6 Viabahn were noted beginning at the axillary level, two 9 x 50 Viabahn's were deployed, then a 10 x 50, subsequently, a 13 x 10 and a 13 x 5, and then lastly, a 10 x 50. Following this, they were all angioplastied with a 10 x 4  Dorado balloon. Most inflations were from  20-28 atmospheres. Following this, contrast injection demonstrated a widely patent conduit with no restriction to flow.   Arterial inflow was once again verified and then the incision in the vein near the  arterial anastomosis was closed with interrupted 6-0 Prolene.  It should be noted that the secondary incision was closed prior to stenting.   Once the reconstruction was completed, there was a palpable thrill noted.  Both wounds were then irrigated with sterile saline and closed in multiple layers using 3-0 Vicryl, followed by 4-0 Monocryl subcuticular and Dermabond.   The patient tolerated the procedure well. There were no immediate complications. Sponge and needle counts were correct. He was taken to the recovery area in stable condition.    ____________________________ Renford Dills, MD ggs:ts D: 04/25/2014 17:05:37 ET T: 04/25/2014 18:27:54 ET JOB#: [id number]  cc: Renford Dills, MD, <Dictator> Renford Dills MD ELECTRONICALLY SIGNED 05/01/2014 12:24

## 2015-01-28 DIAGNOSIS — D631 Anemia in chronic kidney disease: Secondary | ICD-10-CM | POA: Diagnosis not present

## 2015-01-28 DIAGNOSIS — T82898D Other specified complication of vascular prosthetic devices, implants and grafts, subsequent encounter: Secondary | ICD-10-CM | POA: Diagnosis not present

## 2015-01-28 DIAGNOSIS — N2581 Secondary hyperparathyroidism of renal origin: Secondary | ICD-10-CM | POA: Diagnosis not present

## 2015-01-28 DIAGNOSIS — L0390 Cellulitis, unspecified: Secondary | ICD-10-CM | POA: Diagnosis not present

## 2015-01-28 DIAGNOSIS — N186 End stage renal disease: Secondary | ICD-10-CM | POA: Diagnosis not present

## 2015-01-29 DIAGNOSIS — N186 End stage renal disease: Secondary | ICD-10-CM | POA: Diagnosis not present

## 2015-01-29 DIAGNOSIS — N2581 Secondary hyperparathyroidism of renal origin: Secondary | ICD-10-CM | POA: Diagnosis not present

## 2015-01-29 DIAGNOSIS — T82898D Other specified complication of vascular prosthetic devices, implants and grafts, subsequent encounter: Secondary | ICD-10-CM | POA: Diagnosis not present

## 2015-01-29 DIAGNOSIS — L0390 Cellulitis, unspecified: Secondary | ICD-10-CM | POA: Diagnosis not present

## 2015-01-29 DIAGNOSIS — D631 Anemia in chronic kidney disease: Secondary | ICD-10-CM | POA: Diagnosis not present

## 2015-01-31 DIAGNOSIS — N2581 Secondary hyperparathyroidism of renal origin: Secondary | ICD-10-CM | POA: Diagnosis not present

## 2015-01-31 DIAGNOSIS — E1129 Type 2 diabetes mellitus with other diabetic kidney complication: Secondary | ICD-10-CM | POA: Diagnosis not present

## 2015-01-31 DIAGNOSIS — Z86711 Personal history of pulmonary embolism: Secondary | ICD-10-CM | POA: Diagnosis not present

## 2015-01-31 DIAGNOSIS — I2782 Chronic pulmonary embolism: Secondary | ICD-10-CM | POA: Diagnosis not present

## 2015-01-31 DIAGNOSIS — T82898D Other specified complication of vascular prosthetic devices, implants and grafts, subsequent encounter: Secondary | ICD-10-CM | POA: Diagnosis not present

## 2015-01-31 DIAGNOSIS — N186 End stage renal disease: Secondary | ICD-10-CM | POA: Diagnosis not present

## 2015-01-31 DIAGNOSIS — L0390 Cellulitis, unspecified: Secondary | ICD-10-CM | POA: Diagnosis not present

## 2015-01-31 DIAGNOSIS — D631 Anemia in chronic kidney disease: Secondary | ICD-10-CM | POA: Diagnosis not present

## 2015-02-02 DIAGNOSIS — N186 End stage renal disease: Secondary | ICD-10-CM | POA: Diagnosis not present

## 2015-02-02 DIAGNOSIS — L0390 Cellulitis, unspecified: Secondary | ICD-10-CM | POA: Diagnosis not present

## 2015-02-02 DIAGNOSIS — Z992 Dependence on renal dialysis: Secondary | ICD-10-CM | POA: Diagnosis not present

## 2015-02-02 DIAGNOSIS — E1129 Type 2 diabetes mellitus with other diabetic kidney complication: Secondary | ICD-10-CM | POA: Diagnosis not present

## 2015-02-02 DIAGNOSIS — D631 Anemia in chronic kidney disease: Secondary | ICD-10-CM | POA: Diagnosis not present

## 2015-02-02 DIAGNOSIS — T82898D Other specified complication of vascular prosthetic devices, implants and grafts, subsequent encounter: Secondary | ICD-10-CM | POA: Diagnosis not present

## 2015-02-02 DIAGNOSIS — N2581 Secondary hyperparathyroidism of renal origin: Secondary | ICD-10-CM | POA: Diagnosis not present

## 2015-02-04 DIAGNOSIS — L0390 Cellulitis, unspecified: Secondary | ICD-10-CM | POA: Diagnosis not present

## 2015-02-04 DIAGNOSIS — D631 Anemia in chronic kidney disease: Secondary | ICD-10-CM | POA: Diagnosis not present

## 2015-02-04 DIAGNOSIS — N186 End stage renal disease: Secondary | ICD-10-CM | POA: Diagnosis not present

## 2015-02-04 DIAGNOSIS — N2581 Secondary hyperparathyroidism of renal origin: Secondary | ICD-10-CM | POA: Diagnosis not present

## 2015-02-05 DIAGNOSIS — L0390 Cellulitis, unspecified: Secondary | ICD-10-CM | POA: Diagnosis not present

## 2015-02-05 DIAGNOSIS — D631 Anemia in chronic kidney disease: Secondary | ICD-10-CM | POA: Diagnosis not present

## 2015-02-05 DIAGNOSIS — N186 End stage renal disease: Secondary | ICD-10-CM | POA: Diagnosis not present

## 2015-02-05 DIAGNOSIS — N2581 Secondary hyperparathyroidism of renal origin: Secondary | ICD-10-CM | POA: Diagnosis not present

## 2015-02-07 DIAGNOSIS — N186 End stage renal disease: Secondary | ICD-10-CM | POA: Diagnosis not present

## 2015-02-07 DIAGNOSIS — D631 Anemia in chronic kidney disease: Secondary | ICD-10-CM | POA: Diagnosis not present

## 2015-02-07 DIAGNOSIS — N2581 Secondary hyperparathyroidism of renal origin: Secondary | ICD-10-CM | POA: Diagnosis not present

## 2015-02-07 DIAGNOSIS — L0390 Cellulitis, unspecified: Secondary | ICD-10-CM | POA: Diagnosis not present

## 2015-02-09 DIAGNOSIS — N186 End stage renal disease: Secondary | ICD-10-CM | POA: Diagnosis not present

## 2015-02-09 DIAGNOSIS — D631 Anemia in chronic kidney disease: Secondary | ICD-10-CM | POA: Diagnosis not present

## 2015-02-09 DIAGNOSIS — L0390 Cellulitis, unspecified: Secondary | ICD-10-CM | POA: Diagnosis not present

## 2015-02-09 DIAGNOSIS — N2581 Secondary hyperparathyroidism of renal origin: Secondary | ICD-10-CM | POA: Diagnosis not present

## 2015-02-11 DIAGNOSIS — L0390 Cellulitis, unspecified: Secondary | ICD-10-CM | POA: Diagnosis not present

## 2015-02-11 DIAGNOSIS — N186 End stage renal disease: Secondary | ICD-10-CM | POA: Diagnosis not present

## 2015-02-11 DIAGNOSIS — N2581 Secondary hyperparathyroidism of renal origin: Secondary | ICD-10-CM | POA: Diagnosis not present

## 2015-02-11 DIAGNOSIS — D631 Anemia in chronic kidney disease: Secondary | ICD-10-CM | POA: Diagnosis not present

## 2015-02-12 DIAGNOSIS — D631 Anemia in chronic kidney disease: Secondary | ICD-10-CM | POA: Diagnosis not present

## 2015-02-12 DIAGNOSIS — N2581 Secondary hyperparathyroidism of renal origin: Secondary | ICD-10-CM | POA: Diagnosis not present

## 2015-02-12 DIAGNOSIS — L0390 Cellulitis, unspecified: Secondary | ICD-10-CM | POA: Diagnosis not present

## 2015-02-12 DIAGNOSIS — N186 End stage renal disease: Secondary | ICD-10-CM | POA: Diagnosis not present

## 2015-02-14 DIAGNOSIS — N186 End stage renal disease: Secondary | ICD-10-CM | POA: Diagnosis not present

## 2015-02-14 DIAGNOSIS — Z86711 Personal history of pulmonary embolism: Secondary | ICD-10-CM | POA: Diagnosis not present

## 2015-02-14 DIAGNOSIS — L0390 Cellulitis, unspecified: Secondary | ICD-10-CM | POA: Diagnosis not present

## 2015-02-14 DIAGNOSIS — D631 Anemia in chronic kidney disease: Secondary | ICD-10-CM | POA: Diagnosis not present

## 2015-02-14 DIAGNOSIS — I2782 Chronic pulmonary embolism: Secondary | ICD-10-CM | POA: Diagnosis not present

## 2015-02-14 DIAGNOSIS — N2581 Secondary hyperparathyroidism of renal origin: Secondary | ICD-10-CM | POA: Diagnosis not present

## 2015-02-16 DIAGNOSIS — N2581 Secondary hyperparathyroidism of renal origin: Secondary | ICD-10-CM | POA: Diagnosis not present

## 2015-02-16 DIAGNOSIS — L0390 Cellulitis, unspecified: Secondary | ICD-10-CM | POA: Diagnosis not present

## 2015-02-16 DIAGNOSIS — N186 End stage renal disease: Secondary | ICD-10-CM | POA: Diagnosis not present

## 2015-02-16 DIAGNOSIS — D631 Anemia in chronic kidney disease: Secondary | ICD-10-CM | POA: Diagnosis not present

## 2015-02-18 DIAGNOSIS — N186 End stage renal disease: Secondary | ICD-10-CM | POA: Diagnosis not present

## 2015-02-18 DIAGNOSIS — L0390 Cellulitis, unspecified: Secondary | ICD-10-CM | POA: Diagnosis not present

## 2015-02-18 DIAGNOSIS — D631 Anemia in chronic kidney disease: Secondary | ICD-10-CM | POA: Diagnosis not present

## 2015-02-18 DIAGNOSIS — N2581 Secondary hyperparathyroidism of renal origin: Secondary | ICD-10-CM | POA: Diagnosis not present

## 2015-02-19 DIAGNOSIS — L0390 Cellulitis, unspecified: Secondary | ICD-10-CM | POA: Diagnosis not present

## 2015-02-19 DIAGNOSIS — N186 End stage renal disease: Secondary | ICD-10-CM | POA: Diagnosis not present

## 2015-02-19 DIAGNOSIS — D631 Anemia in chronic kidney disease: Secondary | ICD-10-CM | POA: Diagnosis not present

## 2015-02-19 DIAGNOSIS — N2581 Secondary hyperparathyroidism of renal origin: Secondary | ICD-10-CM | POA: Diagnosis not present

## 2015-02-21 DIAGNOSIS — L0390 Cellulitis, unspecified: Secondary | ICD-10-CM | POA: Diagnosis not present

## 2015-02-21 DIAGNOSIS — D631 Anemia in chronic kidney disease: Secondary | ICD-10-CM | POA: Diagnosis not present

## 2015-02-21 DIAGNOSIS — N2581 Secondary hyperparathyroidism of renal origin: Secondary | ICD-10-CM | POA: Diagnosis not present

## 2015-02-21 DIAGNOSIS — N186 End stage renal disease: Secondary | ICD-10-CM | POA: Diagnosis not present

## 2015-02-23 DIAGNOSIS — L0390 Cellulitis, unspecified: Secondary | ICD-10-CM | POA: Diagnosis not present

## 2015-02-23 DIAGNOSIS — N2581 Secondary hyperparathyroidism of renal origin: Secondary | ICD-10-CM | POA: Diagnosis not present

## 2015-02-23 DIAGNOSIS — D631 Anemia in chronic kidney disease: Secondary | ICD-10-CM | POA: Diagnosis not present

## 2015-02-23 DIAGNOSIS — N186 End stage renal disease: Secondary | ICD-10-CM | POA: Diagnosis not present

## 2015-02-25 DIAGNOSIS — D631 Anemia in chronic kidney disease: Secondary | ICD-10-CM | POA: Diagnosis not present

## 2015-02-25 DIAGNOSIS — N186 End stage renal disease: Secondary | ICD-10-CM | POA: Diagnosis not present

## 2015-02-25 DIAGNOSIS — N2581 Secondary hyperparathyroidism of renal origin: Secondary | ICD-10-CM | POA: Diagnosis not present

## 2015-02-25 DIAGNOSIS — L0390 Cellulitis, unspecified: Secondary | ICD-10-CM | POA: Diagnosis not present

## 2015-02-26 DIAGNOSIS — N186 End stage renal disease: Secondary | ICD-10-CM | POA: Diagnosis not present

## 2015-02-26 DIAGNOSIS — N2581 Secondary hyperparathyroidism of renal origin: Secondary | ICD-10-CM | POA: Diagnosis not present

## 2015-02-26 DIAGNOSIS — D631 Anemia in chronic kidney disease: Secondary | ICD-10-CM | POA: Diagnosis not present

## 2015-02-26 DIAGNOSIS — L0390 Cellulitis, unspecified: Secondary | ICD-10-CM | POA: Diagnosis not present

## 2015-02-28 DIAGNOSIS — Z86711 Personal history of pulmonary embolism: Secondary | ICD-10-CM | POA: Diagnosis not present

## 2015-02-28 DIAGNOSIS — I2782 Chronic pulmonary embolism: Secondary | ICD-10-CM | POA: Diagnosis not present

## 2015-02-28 DIAGNOSIS — N2581 Secondary hyperparathyroidism of renal origin: Secondary | ICD-10-CM | POA: Diagnosis not present

## 2015-02-28 DIAGNOSIS — L0390 Cellulitis, unspecified: Secondary | ICD-10-CM | POA: Diagnosis not present

## 2015-02-28 DIAGNOSIS — D631 Anemia in chronic kidney disease: Secondary | ICD-10-CM | POA: Diagnosis not present

## 2015-02-28 DIAGNOSIS — N186 End stage renal disease: Secondary | ICD-10-CM | POA: Diagnosis not present

## 2015-03-02 DIAGNOSIS — D631 Anemia in chronic kidney disease: Secondary | ICD-10-CM | POA: Diagnosis not present

## 2015-03-02 DIAGNOSIS — L0390 Cellulitis, unspecified: Secondary | ICD-10-CM | POA: Diagnosis not present

## 2015-03-02 DIAGNOSIS — N2581 Secondary hyperparathyroidism of renal origin: Secondary | ICD-10-CM | POA: Diagnosis not present

## 2015-03-02 DIAGNOSIS — N186 End stage renal disease: Secondary | ICD-10-CM | POA: Diagnosis not present

## 2015-03-04 DIAGNOSIS — L0390 Cellulitis, unspecified: Secondary | ICD-10-CM | POA: Diagnosis not present

## 2015-03-04 DIAGNOSIS — N186 End stage renal disease: Secondary | ICD-10-CM | POA: Diagnosis not present

## 2015-03-04 DIAGNOSIS — D631 Anemia in chronic kidney disease: Secondary | ICD-10-CM | POA: Diagnosis not present

## 2015-03-04 DIAGNOSIS — N2581 Secondary hyperparathyroidism of renal origin: Secondary | ICD-10-CM | POA: Diagnosis not present

## 2015-03-05 DIAGNOSIS — E1129 Type 2 diabetes mellitus with other diabetic kidney complication: Secondary | ICD-10-CM | POA: Diagnosis not present

## 2015-03-05 DIAGNOSIS — Z992 Dependence on renal dialysis: Secondary | ICD-10-CM | POA: Diagnosis not present

## 2015-03-05 DIAGNOSIS — L0390 Cellulitis, unspecified: Secondary | ICD-10-CM | POA: Diagnosis not present

## 2015-03-05 DIAGNOSIS — D631 Anemia in chronic kidney disease: Secondary | ICD-10-CM | POA: Diagnosis not present

## 2015-03-05 DIAGNOSIS — N2581 Secondary hyperparathyroidism of renal origin: Secondary | ICD-10-CM | POA: Diagnosis not present

## 2015-03-05 DIAGNOSIS — N186 End stage renal disease: Secondary | ICD-10-CM | POA: Diagnosis not present

## 2015-03-07 DIAGNOSIS — E1129 Type 2 diabetes mellitus with other diabetic kidney complication: Secondary | ICD-10-CM | POA: Diagnosis not present

## 2015-03-07 DIAGNOSIS — I2782 Chronic pulmonary embolism: Secondary | ICD-10-CM | POA: Diagnosis not present

## 2015-03-07 DIAGNOSIS — D631 Anemia in chronic kidney disease: Secondary | ICD-10-CM | POA: Diagnosis not present

## 2015-03-07 DIAGNOSIS — N2581 Secondary hyperparathyroidism of renal origin: Secondary | ICD-10-CM | POA: Diagnosis not present

## 2015-03-07 DIAGNOSIS — N186 End stage renal disease: Secondary | ICD-10-CM | POA: Diagnosis not present

## 2015-03-07 DIAGNOSIS — L0390 Cellulitis, unspecified: Secondary | ICD-10-CM | POA: Diagnosis not present

## 2015-03-07 DIAGNOSIS — Z86711 Personal history of pulmonary embolism: Secondary | ICD-10-CM | POA: Diagnosis not present

## 2015-03-09 DIAGNOSIS — N2581 Secondary hyperparathyroidism of renal origin: Secondary | ICD-10-CM | POA: Diagnosis not present

## 2015-03-09 DIAGNOSIS — N186 End stage renal disease: Secondary | ICD-10-CM | POA: Diagnosis not present

## 2015-03-09 DIAGNOSIS — D631 Anemia in chronic kidney disease: Secondary | ICD-10-CM | POA: Diagnosis not present

## 2015-03-09 DIAGNOSIS — L0390 Cellulitis, unspecified: Secondary | ICD-10-CM | POA: Diagnosis not present

## 2015-03-09 DIAGNOSIS — E1129 Type 2 diabetes mellitus with other diabetic kidney complication: Secondary | ICD-10-CM | POA: Diagnosis not present

## 2015-03-11 DIAGNOSIS — E1129 Type 2 diabetes mellitus with other diabetic kidney complication: Secondary | ICD-10-CM | POA: Diagnosis not present

## 2015-03-11 DIAGNOSIS — L0390 Cellulitis, unspecified: Secondary | ICD-10-CM | POA: Diagnosis not present

## 2015-03-11 DIAGNOSIS — N186 End stage renal disease: Secondary | ICD-10-CM | POA: Diagnosis not present

## 2015-03-11 DIAGNOSIS — N2581 Secondary hyperparathyroidism of renal origin: Secondary | ICD-10-CM | POA: Diagnosis not present

## 2015-03-11 DIAGNOSIS — D631 Anemia in chronic kidney disease: Secondary | ICD-10-CM | POA: Diagnosis not present

## 2015-03-12 DIAGNOSIS — N186 End stage renal disease: Secondary | ICD-10-CM | POA: Diagnosis not present

## 2015-03-12 DIAGNOSIS — L0390 Cellulitis, unspecified: Secondary | ICD-10-CM | POA: Diagnosis not present

## 2015-03-12 DIAGNOSIS — D631 Anemia in chronic kidney disease: Secondary | ICD-10-CM | POA: Diagnosis not present

## 2015-03-12 DIAGNOSIS — N2581 Secondary hyperparathyroidism of renal origin: Secondary | ICD-10-CM | POA: Diagnosis not present

## 2015-03-12 DIAGNOSIS — E1129 Type 2 diabetes mellitus with other diabetic kidney complication: Secondary | ICD-10-CM | POA: Diagnosis not present

## 2015-03-14 DIAGNOSIS — E1129 Type 2 diabetes mellitus with other diabetic kidney complication: Secondary | ICD-10-CM | POA: Diagnosis not present

## 2015-03-14 DIAGNOSIS — D631 Anemia in chronic kidney disease: Secondary | ICD-10-CM | POA: Diagnosis not present

## 2015-03-14 DIAGNOSIS — Z86711 Personal history of pulmonary embolism: Secondary | ICD-10-CM | POA: Diagnosis not present

## 2015-03-14 DIAGNOSIS — N186 End stage renal disease: Secondary | ICD-10-CM | POA: Diagnosis not present

## 2015-03-14 DIAGNOSIS — N2581 Secondary hyperparathyroidism of renal origin: Secondary | ICD-10-CM | POA: Diagnosis not present

## 2015-03-14 DIAGNOSIS — L0390 Cellulitis, unspecified: Secondary | ICD-10-CM | POA: Diagnosis not present

## 2015-03-14 DIAGNOSIS — I2782 Chronic pulmonary embolism: Secondary | ICD-10-CM | POA: Diagnosis not present

## 2015-03-16 DIAGNOSIS — E1129 Type 2 diabetes mellitus with other diabetic kidney complication: Secondary | ICD-10-CM | POA: Diagnosis not present

## 2015-03-16 DIAGNOSIS — L02416 Cutaneous abscess of left lower limb: Secondary | ICD-10-CM | POA: Diagnosis not present

## 2015-03-16 DIAGNOSIS — L0390 Cellulitis, unspecified: Secondary | ICD-10-CM | POA: Diagnosis not present

## 2015-03-16 DIAGNOSIS — N186 End stage renal disease: Secondary | ICD-10-CM | POA: Diagnosis not present

## 2015-03-16 DIAGNOSIS — D631 Anemia in chronic kidney disease: Secondary | ICD-10-CM | POA: Diagnosis not present

## 2015-03-16 DIAGNOSIS — N2581 Secondary hyperparathyroidism of renal origin: Secondary | ICD-10-CM | POA: Diagnosis not present

## 2015-03-18 DIAGNOSIS — N186 End stage renal disease: Secondary | ICD-10-CM | POA: Diagnosis not present

## 2015-03-18 DIAGNOSIS — N2581 Secondary hyperparathyroidism of renal origin: Secondary | ICD-10-CM | POA: Diagnosis not present

## 2015-03-18 DIAGNOSIS — D631 Anemia in chronic kidney disease: Secondary | ICD-10-CM | POA: Diagnosis not present

## 2015-03-18 DIAGNOSIS — E1129 Type 2 diabetes mellitus with other diabetic kidney complication: Secondary | ICD-10-CM | POA: Diagnosis not present

## 2015-03-18 DIAGNOSIS — L0390 Cellulitis, unspecified: Secondary | ICD-10-CM | POA: Diagnosis not present

## 2015-03-19 DIAGNOSIS — N2581 Secondary hyperparathyroidism of renal origin: Secondary | ICD-10-CM | POA: Diagnosis not present

## 2015-03-19 DIAGNOSIS — D631 Anemia in chronic kidney disease: Secondary | ICD-10-CM | POA: Diagnosis not present

## 2015-03-19 DIAGNOSIS — E1129 Type 2 diabetes mellitus with other diabetic kidney complication: Secondary | ICD-10-CM | POA: Diagnosis not present

## 2015-03-19 DIAGNOSIS — N186 End stage renal disease: Secondary | ICD-10-CM | POA: Diagnosis not present

## 2015-03-19 DIAGNOSIS — L0390 Cellulitis, unspecified: Secondary | ICD-10-CM | POA: Diagnosis not present

## 2015-03-21 DIAGNOSIS — N186 End stage renal disease: Secondary | ICD-10-CM | POA: Diagnosis not present

## 2015-03-21 DIAGNOSIS — I2782 Chronic pulmonary embolism: Secondary | ICD-10-CM | POA: Diagnosis not present

## 2015-03-21 DIAGNOSIS — Z86711 Personal history of pulmonary embolism: Secondary | ICD-10-CM | POA: Diagnosis not present

## 2015-03-21 DIAGNOSIS — D631 Anemia in chronic kidney disease: Secondary | ICD-10-CM | POA: Diagnosis not present

## 2015-03-21 DIAGNOSIS — N2581 Secondary hyperparathyroidism of renal origin: Secondary | ICD-10-CM | POA: Diagnosis not present

## 2015-03-21 DIAGNOSIS — L0390 Cellulitis, unspecified: Secondary | ICD-10-CM | POA: Diagnosis not present

## 2015-03-21 DIAGNOSIS — E1129 Type 2 diabetes mellitus with other diabetic kidney complication: Secondary | ICD-10-CM | POA: Diagnosis not present

## 2015-03-23 DIAGNOSIS — N2581 Secondary hyperparathyroidism of renal origin: Secondary | ICD-10-CM | POA: Diagnosis not present

## 2015-03-23 DIAGNOSIS — E1129 Type 2 diabetes mellitus with other diabetic kidney complication: Secondary | ICD-10-CM | POA: Diagnosis not present

## 2015-03-23 DIAGNOSIS — N186 End stage renal disease: Secondary | ICD-10-CM | POA: Diagnosis not present

## 2015-03-23 DIAGNOSIS — D631 Anemia in chronic kidney disease: Secondary | ICD-10-CM | POA: Diagnosis not present

## 2015-03-23 DIAGNOSIS — L0390 Cellulitis, unspecified: Secondary | ICD-10-CM | POA: Diagnosis not present

## 2015-03-25 DIAGNOSIS — L0390 Cellulitis, unspecified: Secondary | ICD-10-CM | POA: Diagnosis not present

## 2015-03-25 DIAGNOSIS — N2581 Secondary hyperparathyroidism of renal origin: Secondary | ICD-10-CM | POA: Diagnosis not present

## 2015-03-25 DIAGNOSIS — D631 Anemia in chronic kidney disease: Secondary | ICD-10-CM | POA: Diagnosis not present

## 2015-03-25 DIAGNOSIS — N186 End stage renal disease: Secondary | ICD-10-CM | POA: Diagnosis not present

## 2015-03-25 DIAGNOSIS — E1129 Type 2 diabetes mellitus with other diabetic kidney complication: Secondary | ICD-10-CM | POA: Diagnosis not present

## 2015-03-26 DIAGNOSIS — N186 End stage renal disease: Secondary | ICD-10-CM | POA: Diagnosis not present

## 2015-03-26 DIAGNOSIS — D631 Anemia in chronic kidney disease: Secondary | ICD-10-CM | POA: Diagnosis not present

## 2015-03-26 DIAGNOSIS — L0390 Cellulitis, unspecified: Secondary | ICD-10-CM | POA: Diagnosis not present

## 2015-03-26 DIAGNOSIS — E1129 Type 2 diabetes mellitus with other diabetic kidney complication: Secondary | ICD-10-CM | POA: Diagnosis not present

## 2015-03-26 DIAGNOSIS — N2581 Secondary hyperparathyroidism of renal origin: Secondary | ICD-10-CM | POA: Diagnosis not present

## 2015-03-28 DIAGNOSIS — Z86711 Personal history of pulmonary embolism: Secondary | ICD-10-CM | POA: Diagnosis not present

## 2015-03-28 DIAGNOSIS — I2782 Chronic pulmonary embolism: Secondary | ICD-10-CM | POA: Diagnosis not present

## 2015-03-28 DIAGNOSIS — N2581 Secondary hyperparathyroidism of renal origin: Secondary | ICD-10-CM | POA: Diagnosis not present

## 2015-03-28 DIAGNOSIS — L0390 Cellulitis, unspecified: Secondary | ICD-10-CM | POA: Diagnosis not present

## 2015-03-28 DIAGNOSIS — E1129 Type 2 diabetes mellitus with other diabetic kidney complication: Secondary | ICD-10-CM | POA: Diagnosis not present

## 2015-03-28 DIAGNOSIS — N186 End stage renal disease: Secondary | ICD-10-CM | POA: Diagnosis not present

## 2015-03-28 DIAGNOSIS — D631 Anemia in chronic kidney disease: Secondary | ICD-10-CM | POA: Diagnosis not present

## 2015-03-30 DIAGNOSIS — N186 End stage renal disease: Secondary | ICD-10-CM | POA: Diagnosis not present

## 2015-03-30 DIAGNOSIS — E1129 Type 2 diabetes mellitus with other diabetic kidney complication: Secondary | ICD-10-CM | POA: Diagnosis not present

## 2015-03-30 DIAGNOSIS — L0390 Cellulitis, unspecified: Secondary | ICD-10-CM | POA: Diagnosis not present

## 2015-03-30 DIAGNOSIS — N2581 Secondary hyperparathyroidism of renal origin: Secondary | ICD-10-CM | POA: Diagnosis not present

## 2015-03-30 DIAGNOSIS — D631 Anemia in chronic kidney disease: Secondary | ICD-10-CM | POA: Diagnosis not present

## 2015-04-01 DIAGNOSIS — E1129 Type 2 diabetes mellitus with other diabetic kidney complication: Secondary | ICD-10-CM | POA: Diagnosis not present

## 2015-04-01 DIAGNOSIS — D631 Anemia in chronic kidney disease: Secondary | ICD-10-CM | POA: Diagnosis not present

## 2015-04-01 DIAGNOSIS — N186 End stage renal disease: Secondary | ICD-10-CM | POA: Diagnosis not present

## 2015-04-01 DIAGNOSIS — N2581 Secondary hyperparathyroidism of renal origin: Secondary | ICD-10-CM | POA: Diagnosis not present

## 2015-04-01 DIAGNOSIS — L0390 Cellulitis, unspecified: Secondary | ICD-10-CM | POA: Diagnosis not present

## 2015-04-02 DIAGNOSIS — L0390 Cellulitis, unspecified: Secondary | ICD-10-CM | POA: Diagnosis not present

## 2015-04-02 DIAGNOSIS — N186 End stage renal disease: Secondary | ICD-10-CM | POA: Diagnosis not present

## 2015-04-02 DIAGNOSIS — D631 Anemia in chronic kidney disease: Secondary | ICD-10-CM | POA: Diagnosis not present

## 2015-04-02 DIAGNOSIS — N2581 Secondary hyperparathyroidism of renal origin: Secondary | ICD-10-CM | POA: Diagnosis not present

## 2015-04-02 DIAGNOSIS — E1129 Type 2 diabetes mellitus with other diabetic kidney complication: Secondary | ICD-10-CM | POA: Diagnosis not present

## 2015-04-04 DIAGNOSIS — D631 Anemia in chronic kidney disease: Secondary | ICD-10-CM | POA: Diagnosis not present

## 2015-04-04 DIAGNOSIS — N186 End stage renal disease: Secondary | ICD-10-CM | POA: Diagnosis not present

## 2015-04-04 DIAGNOSIS — I2782 Chronic pulmonary embolism: Secondary | ICD-10-CM | POA: Diagnosis not present

## 2015-04-04 DIAGNOSIS — E1129 Type 2 diabetes mellitus with other diabetic kidney complication: Secondary | ICD-10-CM | POA: Diagnosis not present

## 2015-04-04 DIAGNOSIS — L0390 Cellulitis, unspecified: Secondary | ICD-10-CM | POA: Diagnosis not present

## 2015-04-04 DIAGNOSIS — Z86711 Personal history of pulmonary embolism: Secondary | ICD-10-CM | POA: Diagnosis not present

## 2015-04-04 DIAGNOSIS — N2581 Secondary hyperparathyroidism of renal origin: Secondary | ICD-10-CM | POA: Diagnosis not present

## 2015-04-04 DIAGNOSIS — Z992 Dependence on renal dialysis: Secondary | ICD-10-CM | POA: Diagnosis not present

## 2015-04-05 DIAGNOSIS — M109 Gout, unspecified: Secondary | ICD-10-CM | POA: Diagnosis not present

## 2015-04-05 DIAGNOSIS — R4020 Unspecified coma: Secondary | ICD-10-CM | POA: Diagnosis not present

## 2015-04-05 DIAGNOSIS — L89302 Pressure ulcer of unspecified buttock, stage 2: Secondary | ICD-10-CM | POA: Diagnosis not present

## 2015-04-05 DIAGNOSIS — N186 End stage renal disease: Secondary | ICD-10-CM | POA: Diagnosis not present

## 2015-04-05 DIAGNOSIS — E1121 Type 2 diabetes mellitus with diabetic nephropathy: Secondary | ICD-10-CM | POA: Diagnosis not present

## 2015-04-05 DIAGNOSIS — Z7901 Long term (current) use of anticoagulants: Secondary | ICD-10-CM | POA: Diagnosis not present

## 2015-04-05 DIAGNOSIS — Z794 Long term (current) use of insulin: Secondary | ICD-10-CM | POA: Diagnosis not present

## 2015-04-06 DIAGNOSIS — E1129 Type 2 diabetes mellitus with other diabetic kidney complication: Secondary | ICD-10-CM | POA: Diagnosis not present

## 2015-04-06 DIAGNOSIS — N186 End stage renal disease: Secondary | ICD-10-CM | POA: Diagnosis not present

## 2015-04-06 DIAGNOSIS — D631 Anemia in chronic kidney disease: Secondary | ICD-10-CM | POA: Diagnosis not present

## 2015-04-06 DIAGNOSIS — N2581 Secondary hyperparathyroidism of renal origin: Secondary | ICD-10-CM | POA: Diagnosis not present

## 2015-04-08 DIAGNOSIS — E1129 Type 2 diabetes mellitus with other diabetic kidney complication: Secondary | ICD-10-CM | POA: Diagnosis not present

## 2015-04-08 DIAGNOSIS — N2581 Secondary hyperparathyroidism of renal origin: Secondary | ICD-10-CM | POA: Diagnosis not present

## 2015-04-08 DIAGNOSIS — N186 End stage renal disease: Secondary | ICD-10-CM | POA: Diagnosis not present

## 2015-04-08 DIAGNOSIS — D631 Anemia in chronic kidney disease: Secondary | ICD-10-CM | POA: Diagnosis not present

## 2015-04-09 DIAGNOSIS — N2581 Secondary hyperparathyroidism of renal origin: Secondary | ICD-10-CM | POA: Diagnosis not present

## 2015-04-09 DIAGNOSIS — E1129 Type 2 diabetes mellitus with other diabetic kidney complication: Secondary | ICD-10-CM | POA: Diagnosis not present

## 2015-04-09 DIAGNOSIS — D631 Anemia in chronic kidney disease: Secondary | ICD-10-CM | POA: Diagnosis not present

## 2015-04-09 DIAGNOSIS — N186 End stage renal disease: Secondary | ICD-10-CM | POA: Diagnosis not present

## 2015-04-11 DIAGNOSIS — D631 Anemia in chronic kidney disease: Secondary | ICD-10-CM | POA: Diagnosis not present

## 2015-04-11 DIAGNOSIS — N186 End stage renal disease: Secondary | ICD-10-CM | POA: Diagnosis not present

## 2015-04-11 DIAGNOSIS — E1129 Type 2 diabetes mellitus with other diabetic kidney complication: Secondary | ICD-10-CM | POA: Diagnosis not present

## 2015-04-11 DIAGNOSIS — I2782 Chronic pulmonary embolism: Secondary | ICD-10-CM | POA: Diagnosis not present

## 2015-04-11 DIAGNOSIS — N2581 Secondary hyperparathyroidism of renal origin: Secondary | ICD-10-CM | POA: Diagnosis not present

## 2015-04-11 DIAGNOSIS — Z86711 Personal history of pulmonary embolism: Secondary | ICD-10-CM | POA: Diagnosis not present

## 2015-04-13 DIAGNOSIS — D631 Anemia in chronic kidney disease: Secondary | ICD-10-CM | POA: Diagnosis not present

## 2015-04-13 DIAGNOSIS — N2581 Secondary hyperparathyroidism of renal origin: Secondary | ICD-10-CM | POA: Diagnosis not present

## 2015-04-13 DIAGNOSIS — N186 End stage renal disease: Secondary | ICD-10-CM | POA: Diagnosis not present

## 2015-04-13 DIAGNOSIS — E1129 Type 2 diabetes mellitus with other diabetic kidney complication: Secondary | ICD-10-CM | POA: Diagnosis not present

## 2015-04-14 DIAGNOSIS — L89892 Pressure ulcer of other site, stage 2: Secondary | ICD-10-CM | POA: Diagnosis not present

## 2015-04-14 DIAGNOSIS — I120 Hypertensive chronic kidney disease with stage 5 chronic kidney disease or end stage renal disease: Secondary | ICD-10-CM | POA: Diagnosis not present

## 2015-04-14 DIAGNOSIS — N186 End stage renal disease: Secondary | ICD-10-CM | POA: Diagnosis not present

## 2015-04-14 DIAGNOSIS — N185 Chronic kidney disease, stage 5: Secondary | ICD-10-CM | POA: Diagnosis not present

## 2015-04-14 DIAGNOSIS — M109 Gout, unspecified: Secondary | ICD-10-CM | POA: Diagnosis not present

## 2015-04-14 DIAGNOSIS — E1121 Type 2 diabetes mellitus with diabetic nephropathy: Secondary | ICD-10-CM | POA: Diagnosis not present

## 2015-04-14 DIAGNOSIS — R569 Unspecified convulsions: Secondary | ICD-10-CM | POA: Diagnosis not present

## 2015-04-14 DIAGNOSIS — I129 Hypertensive chronic kidney disease with stage 1 through stage 4 chronic kidney disease, or unspecified chronic kidney disease: Secondary | ICD-10-CM | POA: Diagnosis not present

## 2015-04-15 DIAGNOSIS — E1129 Type 2 diabetes mellitus with other diabetic kidney complication: Secondary | ICD-10-CM | POA: Diagnosis not present

## 2015-04-15 DIAGNOSIS — N186 End stage renal disease: Secondary | ICD-10-CM | POA: Diagnosis not present

## 2015-04-15 DIAGNOSIS — N2581 Secondary hyperparathyroidism of renal origin: Secondary | ICD-10-CM | POA: Diagnosis not present

## 2015-04-15 DIAGNOSIS — D631 Anemia in chronic kidney disease: Secondary | ICD-10-CM | POA: Diagnosis not present

## 2015-04-16 DIAGNOSIS — E1121 Type 2 diabetes mellitus with diabetic nephropathy: Secondary | ICD-10-CM | POA: Diagnosis not present

## 2015-04-16 DIAGNOSIS — E1129 Type 2 diabetes mellitus with other diabetic kidney complication: Secondary | ICD-10-CM | POA: Diagnosis not present

## 2015-04-16 DIAGNOSIS — R569 Unspecified convulsions: Secondary | ICD-10-CM | POA: Diagnosis not present

## 2015-04-16 DIAGNOSIS — I120 Hypertensive chronic kidney disease with stage 5 chronic kidney disease or end stage renal disease: Secondary | ICD-10-CM | POA: Diagnosis not present

## 2015-04-16 DIAGNOSIS — M109 Gout, unspecified: Secondary | ICD-10-CM | POA: Diagnosis not present

## 2015-04-16 DIAGNOSIS — L89892 Pressure ulcer of other site, stage 2: Secondary | ICD-10-CM | POA: Diagnosis not present

## 2015-04-16 DIAGNOSIS — N2581 Secondary hyperparathyroidism of renal origin: Secondary | ICD-10-CM | POA: Diagnosis not present

## 2015-04-16 DIAGNOSIS — N186 End stage renal disease: Secondary | ICD-10-CM | POA: Diagnosis not present

## 2015-04-16 DIAGNOSIS — D631 Anemia in chronic kidney disease: Secondary | ICD-10-CM | POA: Diagnosis not present

## 2015-04-17 ENCOUNTER — Ambulatory Visit: Payer: Medicare Other | Admitting: Neurology

## 2015-04-17 ENCOUNTER — Telehealth: Payer: Self-pay

## 2015-04-17 NOTE — Telephone Encounter (Signed)
Patient did not show to appt today  

## 2015-04-18 DIAGNOSIS — I2782 Chronic pulmonary embolism: Secondary | ICD-10-CM | POA: Diagnosis not present

## 2015-04-18 DIAGNOSIS — L89892 Pressure ulcer of other site, stage 2: Secondary | ICD-10-CM | POA: Diagnosis not present

## 2015-04-18 DIAGNOSIS — N2581 Secondary hyperparathyroidism of renal origin: Secondary | ICD-10-CM | POA: Diagnosis not present

## 2015-04-18 DIAGNOSIS — R569 Unspecified convulsions: Secondary | ICD-10-CM | POA: Diagnosis not present

## 2015-04-18 DIAGNOSIS — D631 Anemia in chronic kidney disease: Secondary | ICD-10-CM | POA: Diagnosis not present

## 2015-04-18 DIAGNOSIS — E1121 Type 2 diabetes mellitus with diabetic nephropathy: Secondary | ICD-10-CM | POA: Diagnosis not present

## 2015-04-18 DIAGNOSIS — Z86711 Personal history of pulmonary embolism: Secondary | ICD-10-CM | POA: Diagnosis not present

## 2015-04-18 DIAGNOSIS — E1129 Type 2 diabetes mellitus with other diabetic kidney complication: Secondary | ICD-10-CM | POA: Diagnosis not present

## 2015-04-18 DIAGNOSIS — M109 Gout, unspecified: Secondary | ICD-10-CM | POA: Diagnosis not present

## 2015-04-18 DIAGNOSIS — I120 Hypertensive chronic kidney disease with stage 5 chronic kidney disease or end stage renal disease: Secondary | ICD-10-CM | POA: Diagnosis not present

## 2015-04-18 DIAGNOSIS — N186 End stage renal disease: Secondary | ICD-10-CM | POA: Diagnosis not present

## 2015-04-20 DIAGNOSIS — D631 Anemia in chronic kidney disease: Secondary | ICD-10-CM | POA: Diagnosis not present

## 2015-04-20 DIAGNOSIS — N186 End stage renal disease: Secondary | ICD-10-CM | POA: Diagnosis not present

## 2015-04-20 DIAGNOSIS — E1129 Type 2 diabetes mellitus with other diabetic kidney complication: Secondary | ICD-10-CM | POA: Diagnosis not present

## 2015-04-20 DIAGNOSIS — N2581 Secondary hyperparathyroidism of renal origin: Secondary | ICD-10-CM | POA: Diagnosis not present

## 2015-04-22 DIAGNOSIS — D631 Anemia in chronic kidney disease: Secondary | ICD-10-CM | POA: Diagnosis not present

## 2015-04-22 DIAGNOSIS — N186 End stage renal disease: Secondary | ICD-10-CM | POA: Diagnosis not present

## 2015-04-22 DIAGNOSIS — N2581 Secondary hyperparathyroidism of renal origin: Secondary | ICD-10-CM | POA: Diagnosis not present

## 2015-04-22 DIAGNOSIS — E1129 Type 2 diabetes mellitus with other diabetic kidney complication: Secondary | ICD-10-CM | POA: Diagnosis not present

## 2015-04-23 DIAGNOSIS — N2581 Secondary hyperparathyroidism of renal origin: Secondary | ICD-10-CM | POA: Diagnosis not present

## 2015-04-23 DIAGNOSIS — D631 Anemia in chronic kidney disease: Secondary | ICD-10-CM | POA: Diagnosis not present

## 2015-04-23 DIAGNOSIS — N186 End stage renal disease: Secondary | ICD-10-CM | POA: Diagnosis not present

## 2015-04-23 DIAGNOSIS — E1129 Type 2 diabetes mellitus with other diabetic kidney complication: Secondary | ICD-10-CM | POA: Diagnosis not present

## 2015-04-24 DIAGNOSIS — E1121 Type 2 diabetes mellitus with diabetic nephropathy: Secondary | ICD-10-CM | POA: Diagnosis not present

## 2015-04-24 DIAGNOSIS — L89892 Pressure ulcer of other site, stage 2: Secondary | ICD-10-CM | POA: Diagnosis not present

## 2015-04-24 DIAGNOSIS — M109 Gout, unspecified: Secondary | ICD-10-CM | POA: Diagnosis not present

## 2015-04-24 DIAGNOSIS — N186 End stage renal disease: Secondary | ICD-10-CM | POA: Diagnosis not present

## 2015-04-24 DIAGNOSIS — R569 Unspecified convulsions: Secondary | ICD-10-CM | POA: Diagnosis not present

## 2015-04-24 DIAGNOSIS — I120 Hypertensive chronic kidney disease with stage 5 chronic kidney disease or end stage renal disease: Secondary | ICD-10-CM | POA: Diagnosis not present

## 2015-04-25 DIAGNOSIS — I2782 Chronic pulmonary embolism: Secondary | ICD-10-CM | POA: Diagnosis not present

## 2015-04-25 DIAGNOSIS — Z86711 Personal history of pulmonary embolism: Secondary | ICD-10-CM | POA: Diagnosis not present

## 2015-04-25 DIAGNOSIS — N186 End stage renal disease: Secondary | ICD-10-CM | POA: Diagnosis not present

## 2015-04-25 DIAGNOSIS — N2581 Secondary hyperparathyroidism of renal origin: Secondary | ICD-10-CM | POA: Diagnosis not present

## 2015-04-25 DIAGNOSIS — D631 Anemia in chronic kidney disease: Secondary | ICD-10-CM | POA: Diagnosis not present

## 2015-04-25 DIAGNOSIS — E1129 Type 2 diabetes mellitus with other diabetic kidney complication: Secondary | ICD-10-CM | POA: Diagnosis not present

## 2015-04-26 DIAGNOSIS — R569 Unspecified convulsions: Secondary | ICD-10-CM | POA: Diagnosis not present

## 2015-04-26 DIAGNOSIS — E1121 Type 2 diabetes mellitus with diabetic nephropathy: Secondary | ICD-10-CM | POA: Diagnosis not present

## 2015-04-26 DIAGNOSIS — N186 End stage renal disease: Secondary | ICD-10-CM | POA: Diagnosis not present

## 2015-04-26 DIAGNOSIS — L89892 Pressure ulcer of other site, stage 2: Secondary | ICD-10-CM | POA: Diagnosis not present

## 2015-04-26 DIAGNOSIS — I120 Hypertensive chronic kidney disease with stage 5 chronic kidney disease or end stage renal disease: Secondary | ICD-10-CM | POA: Diagnosis not present

## 2015-04-26 DIAGNOSIS — M109 Gout, unspecified: Secondary | ICD-10-CM | POA: Diagnosis not present

## 2015-04-27 DIAGNOSIS — N2581 Secondary hyperparathyroidism of renal origin: Secondary | ICD-10-CM | POA: Diagnosis not present

## 2015-04-27 DIAGNOSIS — E1129 Type 2 diabetes mellitus with other diabetic kidney complication: Secondary | ICD-10-CM | POA: Diagnosis not present

## 2015-04-27 DIAGNOSIS — N186 End stage renal disease: Secondary | ICD-10-CM | POA: Diagnosis not present

## 2015-04-27 DIAGNOSIS — D631 Anemia in chronic kidney disease: Secondary | ICD-10-CM | POA: Diagnosis not present

## 2015-04-29 DIAGNOSIS — D631 Anemia in chronic kidney disease: Secondary | ICD-10-CM | POA: Diagnosis not present

## 2015-04-29 DIAGNOSIS — E1121 Type 2 diabetes mellitus with diabetic nephropathy: Secondary | ICD-10-CM | POA: Diagnosis not present

## 2015-04-29 DIAGNOSIS — L89892 Pressure ulcer of other site, stage 2: Secondary | ICD-10-CM | POA: Diagnosis not present

## 2015-04-29 DIAGNOSIS — I120 Hypertensive chronic kidney disease with stage 5 chronic kidney disease or end stage renal disease: Secondary | ICD-10-CM | POA: Diagnosis not present

## 2015-04-29 DIAGNOSIS — E1129 Type 2 diabetes mellitus with other diabetic kidney complication: Secondary | ICD-10-CM | POA: Diagnosis not present

## 2015-04-29 DIAGNOSIS — R569 Unspecified convulsions: Secondary | ICD-10-CM | POA: Diagnosis not present

## 2015-04-29 DIAGNOSIS — N186 End stage renal disease: Secondary | ICD-10-CM | POA: Diagnosis not present

## 2015-04-29 DIAGNOSIS — M109 Gout, unspecified: Secondary | ICD-10-CM | POA: Diagnosis not present

## 2015-04-29 DIAGNOSIS — N2581 Secondary hyperparathyroidism of renal origin: Secondary | ICD-10-CM | POA: Diagnosis not present

## 2015-04-30 DIAGNOSIS — N186 End stage renal disease: Secondary | ICD-10-CM | POA: Diagnosis not present

## 2015-04-30 DIAGNOSIS — D631 Anemia in chronic kidney disease: Secondary | ICD-10-CM | POA: Diagnosis not present

## 2015-04-30 DIAGNOSIS — E1129 Type 2 diabetes mellitus with other diabetic kidney complication: Secondary | ICD-10-CM | POA: Diagnosis not present

## 2015-04-30 DIAGNOSIS — N2581 Secondary hyperparathyroidism of renal origin: Secondary | ICD-10-CM | POA: Diagnosis not present

## 2015-05-01 DIAGNOSIS — I120 Hypertensive chronic kidney disease with stage 5 chronic kidney disease or end stage renal disease: Secondary | ICD-10-CM | POA: Diagnosis not present

## 2015-05-01 DIAGNOSIS — R569 Unspecified convulsions: Secondary | ICD-10-CM | POA: Diagnosis not present

## 2015-05-01 DIAGNOSIS — M109 Gout, unspecified: Secondary | ICD-10-CM | POA: Diagnosis not present

## 2015-05-01 DIAGNOSIS — L89892 Pressure ulcer of other site, stage 2: Secondary | ICD-10-CM | POA: Diagnosis not present

## 2015-05-01 DIAGNOSIS — E1121 Type 2 diabetes mellitus with diabetic nephropathy: Secondary | ICD-10-CM | POA: Diagnosis not present

## 2015-05-01 DIAGNOSIS — N186 End stage renal disease: Secondary | ICD-10-CM | POA: Diagnosis not present

## 2015-05-02 DIAGNOSIS — N2581 Secondary hyperparathyroidism of renal origin: Secondary | ICD-10-CM | POA: Diagnosis not present

## 2015-05-02 DIAGNOSIS — D631 Anemia in chronic kidney disease: Secondary | ICD-10-CM | POA: Diagnosis not present

## 2015-05-02 DIAGNOSIS — Z86711 Personal history of pulmonary embolism: Secondary | ICD-10-CM | POA: Diagnosis not present

## 2015-05-02 DIAGNOSIS — N186 End stage renal disease: Secondary | ICD-10-CM | POA: Diagnosis not present

## 2015-05-02 DIAGNOSIS — E1129 Type 2 diabetes mellitus with other diabetic kidney complication: Secondary | ICD-10-CM | POA: Diagnosis not present

## 2015-05-02 DIAGNOSIS — I2782 Chronic pulmonary embolism: Secondary | ICD-10-CM | POA: Diagnosis not present

## 2015-05-03 DIAGNOSIS — I120 Hypertensive chronic kidney disease with stage 5 chronic kidney disease or end stage renal disease: Secondary | ICD-10-CM | POA: Diagnosis not present

## 2015-05-03 DIAGNOSIS — R569 Unspecified convulsions: Secondary | ICD-10-CM | POA: Diagnosis not present

## 2015-05-03 DIAGNOSIS — M109 Gout, unspecified: Secondary | ICD-10-CM | POA: Diagnosis not present

## 2015-05-03 DIAGNOSIS — L89892 Pressure ulcer of other site, stage 2: Secondary | ICD-10-CM | POA: Diagnosis not present

## 2015-05-03 DIAGNOSIS — N186 End stage renal disease: Secondary | ICD-10-CM | POA: Diagnosis not present

## 2015-05-03 DIAGNOSIS — E1121 Type 2 diabetes mellitus with diabetic nephropathy: Secondary | ICD-10-CM | POA: Diagnosis not present

## 2015-05-04 DIAGNOSIS — E1129 Type 2 diabetes mellitus with other diabetic kidney complication: Secondary | ICD-10-CM | POA: Diagnosis not present

## 2015-05-04 DIAGNOSIS — N2581 Secondary hyperparathyroidism of renal origin: Secondary | ICD-10-CM | POA: Diagnosis not present

## 2015-05-04 DIAGNOSIS — N186 End stage renal disease: Secondary | ICD-10-CM | POA: Diagnosis not present

## 2015-05-04 DIAGNOSIS — D631 Anemia in chronic kidney disease: Secondary | ICD-10-CM | POA: Diagnosis not present

## 2015-05-05 DIAGNOSIS — N186 End stage renal disease: Secondary | ICD-10-CM | POA: Diagnosis not present

## 2015-05-05 DIAGNOSIS — Z992 Dependence on renal dialysis: Secondary | ICD-10-CM | POA: Diagnosis not present

## 2015-05-05 DIAGNOSIS — E1129 Type 2 diabetes mellitus with other diabetic kidney complication: Secondary | ICD-10-CM | POA: Diagnosis not present

## 2015-05-06 DIAGNOSIS — M109 Gout, unspecified: Secondary | ICD-10-CM | POA: Diagnosis not present

## 2015-05-06 DIAGNOSIS — E1121 Type 2 diabetes mellitus with diabetic nephropathy: Secondary | ICD-10-CM | POA: Diagnosis not present

## 2015-05-06 DIAGNOSIS — L89892 Pressure ulcer of other site, stage 2: Secondary | ICD-10-CM | POA: Diagnosis not present

## 2015-05-06 DIAGNOSIS — D631 Anemia in chronic kidney disease: Secondary | ICD-10-CM | POA: Diagnosis not present

## 2015-05-06 DIAGNOSIS — N2581 Secondary hyperparathyroidism of renal origin: Secondary | ICD-10-CM | POA: Diagnosis not present

## 2015-05-06 DIAGNOSIS — N186 End stage renal disease: Secondary | ICD-10-CM | POA: Diagnosis not present

## 2015-05-06 DIAGNOSIS — I120 Hypertensive chronic kidney disease with stage 5 chronic kidney disease or end stage renal disease: Secondary | ICD-10-CM | POA: Diagnosis not present

## 2015-05-06 DIAGNOSIS — R569 Unspecified convulsions: Secondary | ICD-10-CM | POA: Diagnosis not present

## 2015-05-07 DIAGNOSIS — N186 End stage renal disease: Secondary | ICD-10-CM | POA: Diagnosis not present

## 2015-05-07 DIAGNOSIS — N2581 Secondary hyperparathyroidism of renal origin: Secondary | ICD-10-CM | POA: Diagnosis not present

## 2015-05-07 DIAGNOSIS — D631 Anemia in chronic kidney disease: Secondary | ICD-10-CM | POA: Diagnosis not present

## 2015-05-09 DIAGNOSIS — D631 Anemia in chronic kidney disease: Secondary | ICD-10-CM | POA: Diagnosis not present

## 2015-05-09 DIAGNOSIS — N2581 Secondary hyperparathyroidism of renal origin: Secondary | ICD-10-CM | POA: Diagnosis not present

## 2015-05-09 DIAGNOSIS — I2782 Chronic pulmonary embolism: Secondary | ICD-10-CM | POA: Diagnosis not present

## 2015-05-09 DIAGNOSIS — Z86711 Personal history of pulmonary embolism: Secondary | ICD-10-CM | POA: Diagnosis not present

## 2015-05-09 DIAGNOSIS — N186 End stage renal disease: Secondary | ICD-10-CM | POA: Diagnosis not present

## 2015-05-10 DIAGNOSIS — M109 Gout, unspecified: Secondary | ICD-10-CM | POA: Diagnosis not present

## 2015-05-10 DIAGNOSIS — I120 Hypertensive chronic kidney disease with stage 5 chronic kidney disease or end stage renal disease: Secondary | ICD-10-CM | POA: Diagnosis not present

## 2015-05-10 DIAGNOSIS — N186 End stage renal disease: Secondary | ICD-10-CM | POA: Diagnosis not present

## 2015-05-10 DIAGNOSIS — E1121 Type 2 diabetes mellitus with diabetic nephropathy: Secondary | ICD-10-CM | POA: Diagnosis not present

## 2015-05-10 DIAGNOSIS — L89892 Pressure ulcer of other site, stage 2: Secondary | ICD-10-CM | POA: Diagnosis not present

## 2015-05-10 DIAGNOSIS — R569 Unspecified convulsions: Secondary | ICD-10-CM | POA: Diagnosis not present

## 2015-05-11 DIAGNOSIS — N2581 Secondary hyperparathyroidism of renal origin: Secondary | ICD-10-CM | POA: Diagnosis not present

## 2015-05-11 DIAGNOSIS — N186 End stage renal disease: Secondary | ICD-10-CM | POA: Diagnosis not present

## 2015-05-11 DIAGNOSIS — D631 Anemia in chronic kidney disease: Secondary | ICD-10-CM | POA: Diagnosis not present

## 2015-05-13 DIAGNOSIS — N186 End stage renal disease: Secondary | ICD-10-CM | POA: Diagnosis not present

## 2015-05-13 DIAGNOSIS — D631 Anemia in chronic kidney disease: Secondary | ICD-10-CM | POA: Diagnosis not present

## 2015-05-13 DIAGNOSIS — N2581 Secondary hyperparathyroidism of renal origin: Secondary | ICD-10-CM | POA: Diagnosis not present

## 2015-05-14 DIAGNOSIS — N186 End stage renal disease: Secondary | ICD-10-CM | POA: Diagnosis not present

## 2015-05-14 DIAGNOSIS — N2581 Secondary hyperparathyroidism of renal origin: Secondary | ICD-10-CM | POA: Diagnosis not present

## 2015-05-14 DIAGNOSIS — D631 Anemia in chronic kidney disease: Secondary | ICD-10-CM | POA: Diagnosis not present

## 2015-05-15 DIAGNOSIS — M109 Gout, unspecified: Secondary | ICD-10-CM | POA: Diagnosis not present

## 2015-05-15 DIAGNOSIS — N186 End stage renal disease: Secondary | ICD-10-CM | POA: Diagnosis not present

## 2015-05-15 DIAGNOSIS — R569 Unspecified convulsions: Secondary | ICD-10-CM | POA: Diagnosis not present

## 2015-05-15 DIAGNOSIS — L89892 Pressure ulcer of other site, stage 2: Secondary | ICD-10-CM | POA: Diagnosis not present

## 2015-05-15 DIAGNOSIS — E1121 Type 2 diabetes mellitus with diabetic nephropathy: Secondary | ICD-10-CM | POA: Diagnosis not present

## 2015-05-15 DIAGNOSIS — I120 Hypertensive chronic kidney disease with stage 5 chronic kidney disease or end stage renal disease: Secondary | ICD-10-CM | POA: Diagnosis not present

## 2015-05-16 DIAGNOSIS — I2782 Chronic pulmonary embolism: Secondary | ICD-10-CM | POA: Diagnosis not present

## 2015-05-16 DIAGNOSIS — N186 End stage renal disease: Secondary | ICD-10-CM | POA: Diagnosis not present

## 2015-05-16 DIAGNOSIS — Z86711 Personal history of pulmonary embolism: Secondary | ICD-10-CM | POA: Diagnosis not present

## 2015-05-16 DIAGNOSIS — D631 Anemia in chronic kidney disease: Secondary | ICD-10-CM | POA: Diagnosis not present

## 2015-05-16 DIAGNOSIS — N2581 Secondary hyperparathyroidism of renal origin: Secondary | ICD-10-CM | POA: Diagnosis not present

## 2015-05-17 DIAGNOSIS — R569 Unspecified convulsions: Secondary | ICD-10-CM | POA: Diagnosis not present

## 2015-05-17 DIAGNOSIS — L89892 Pressure ulcer of other site, stage 2: Secondary | ICD-10-CM | POA: Diagnosis not present

## 2015-05-17 DIAGNOSIS — I120 Hypertensive chronic kidney disease with stage 5 chronic kidney disease or end stage renal disease: Secondary | ICD-10-CM | POA: Diagnosis not present

## 2015-05-17 DIAGNOSIS — E1121 Type 2 diabetes mellitus with diabetic nephropathy: Secondary | ICD-10-CM | POA: Diagnosis not present

## 2015-05-17 DIAGNOSIS — M109 Gout, unspecified: Secondary | ICD-10-CM | POA: Diagnosis not present

## 2015-05-17 DIAGNOSIS — H2512 Age-related nuclear cataract, left eye: Secondary | ICD-10-CM | POA: Diagnosis not present

## 2015-05-17 DIAGNOSIS — G4730 Sleep apnea, unspecified: Secondary | ICD-10-CM | POA: Diagnosis not present

## 2015-05-17 DIAGNOSIS — E11331 Type 2 diabetes mellitus with moderate nonproliferative diabetic retinopathy with macular edema: Secondary | ICD-10-CM | POA: Diagnosis not present

## 2015-05-17 DIAGNOSIS — N186 End stage renal disease: Secondary | ICD-10-CM | POA: Diagnosis not present

## 2015-05-17 DIAGNOSIS — H2511 Age-related nuclear cataract, right eye: Secondary | ICD-10-CM | POA: Diagnosis not present

## 2015-05-18 DIAGNOSIS — N2581 Secondary hyperparathyroidism of renal origin: Secondary | ICD-10-CM | POA: Diagnosis not present

## 2015-05-18 DIAGNOSIS — N186 End stage renal disease: Secondary | ICD-10-CM | POA: Diagnosis not present

## 2015-05-18 DIAGNOSIS — D631 Anemia in chronic kidney disease: Secondary | ICD-10-CM | POA: Diagnosis not present

## 2015-05-20 DIAGNOSIS — N186 End stage renal disease: Secondary | ICD-10-CM | POA: Diagnosis not present

## 2015-05-20 DIAGNOSIS — D631 Anemia in chronic kidney disease: Secondary | ICD-10-CM | POA: Diagnosis not present

## 2015-05-20 DIAGNOSIS — N2581 Secondary hyperparathyroidism of renal origin: Secondary | ICD-10-CM | POA: Diagnosis not present

## 2015-05-21 DIAGNOSIS — N186 End stage renal disease: Secondary | ICD-10-CM | POA: Diagnosis not present

## 2015-05-21 DIAGNOSIS — N2581 Secondary hyperparathyroidism of renal origin: Secondary | ICD-10-CM | POA: Diagnosis not present

## 2015-05-21 DIAGNOSIS — D631 Anemia in chronic kidney disease: Secondary | ICD-10-CM | POA: Diagnosis not present

## 2015-05-23 DIAGNOSIS — R569 Unspecified convulsions: Secondary | ICD-10-CM | POA: Diagnosis not present

## 2015-05-23 DIAGNOSIS — L89892 Pressure ulcer of other site, stage 2: Secondary | ICD-10-CM | POA: Diagnosis not present

## 2015-05-23 DIAGNOSIS — I120 Hypertensive chronic kidney disease with stage 5 chronic kidney disease or end stage renal disease: Secondary | ICD-10-CM | POA: Diagnosis not present

## 2015-05-23 DIAGNOSIS — E1121 Type 2 diabetes mellitus with diabetic nephropathy: Secondary | ICD-10-CM | POA: Diagnosis not present

## 2015-05-23 DIAGNOSIS — Z86711 Personal history of pulmonary embolism: Secondary | ICD-10-CM | POA: Diagnosis not present

## 2015-05-23 DIAGNOSIS — M109 Gout, unspecified: Secondary | ICD-10-CM | POA: Diagnosis not present

## 2015-05-23 DIAGNOSIS — N186 End stage renal disease: Secondary | ICD-10-CM | POA: Diagnosis not present

## 2015-05-23 DIAGNOSIS — I2782 Chronic pulmonary embolism: Secondary | ICD-10-CM | POA: Diagnosis not present

## 2015-05-23 DIAGNOSIS — D631 Anemia in chronic kidney disease: Secondary | ICD-10-CM | POA: Diagnosis not present

## 2015-05-23 DIAGNOSIS — N2581 Secondary hyperparathyroidism of renal origin: Secondary | ICD-10-CM | POA: Diagnosis not present

## 2015-05-25 DIAGNOSIS — N186 End stage renal disease: Secondary | ICD-10-CM | POA: Diagnosis not present

## 2015-05-25 DIAGNOSIS — D631 Anemia in chronic kidney disease: Secondary | ICD-10-CM | POA: Diagnosis not present

## 2015-05-25 DIAGNOSIS — N2581 Secondary hyperparathyroidism of renal origin: Secondary | ICD-10-CM | POA: Diagnosis not present

## 2015-05-27 DIAGNOSIS — N186 End stage renal disease: Secondary | ICD-10-CM | POA: Diagnosis not present

## 2015-05-27 DIAGNOSIS — D631 Anemia in chronic kidney disease: Secondary | ICD-10-CM | POA: Diagnosis not present

## 2015-05-27 DIAGNOSIS — N2581 Secondary hyperparathyroidism of renal origin: Secondary | ICD-10-CM | POA: Diagnosis not present

## 2015-05-28 DIAGNOSIS — N186 End stage renal disease: Secondary | ICD-10-CM | POA: Diagnosis not present

## 2015-05-28 DIAGNOSIS — D631 Anemia in chronic kidney disease: Secondary | ICD-10-CM | POA: Diagnosis not present

## 2015-05-28 DIAGNOSIS — N2581 Secondary hyperparathyroidism of renal origin: Secondary | ICD-10-CM | POA: Diagnosis not present

## 2015-05-30 DIAGNOSIS — I2782 Chronic pulmonary embolism: Secondary | ICD-10-CM | POA: Diagnosis not present

## 2015-05-30 DIAGNOSIS — N2581 Secondary hyperparathyroidism of renal origin: Secondary | ICD-10-CM | POA: Diagnosis not present

## 2015-05-30 DIAGNOSIS — D631 Anemia in chronic kidney disease: Secondary | ICD-10-CM | POA: Diagnosis not present

## 2015-05-30 DIAGNOSIS — N186 End stage renal disease: Secondary | ICD-10-CM | POA: Diagnosis not present

## 2015-05-30 DIAGNOSIS — Z86711 Personal history of pulmonary embolism: Secondary | ICD-10-CM | POA: Diagnosis not present

## 2015-06-01 DIAGNOSIS — N2581 Secondary hyperparathyroidism of renal origin: Secondary | ICD-10-CM | POA: Diagnosis not present

## 2015-06-01 DIAGNOSIS — N186 End stage renal disease: Secondary | ICD-10-CM | POA: Diagnosis not present

## 2015-06-01 DIAGNOSIS — D631 Anemia in chronic kidney disease: Secondary | ICD-10-CM | POA: Diagnosis not present

## 2015-06-03 DIAGNOSIS — N2581 Secondary hyperparathyroidism of renal origin: Secondary | ICD-10-CM | POA: Diagnosis not present

## 2015-06-03 DIAGNOSIS — D631 Anemia in chronic kidney disease: Secondary | ICD-10-CM | POA: Diagnosis not present

## 2015-06-03 DIAGNOSIS — N186 End stage renal disease: Secondary | ICD-10-CM | POA: Diagnosis not present

## 2015-06-04 DIAGNOSIS — N186 End stage renal disease: Secondary | ICD-10-CM | POA: Diagnosis not present

## 2015-06-04 DIAGNOSIS — N2581 Secondary hyperparathyroidism of renal origin: Secondary | ICD-10-CM | POA: Diagnosis not present

## 2015-06-04 DIAGNOSIS — D631 Anemia in chronic kidney disease: Secondary | ICD-10-CM | POA: Diagnosis not present

## 2015-06-05 DIAGNOSIS — Z992 Dependence on renal dialysis: Secondary | ICD-10-CM | POA: Diagnosis not present

## 2015-06-05 DIAGNOSIS — E1129 Type 2 diabetes mellitus with other diabetic kidney complication: Secondary | ICD-10-CM | POA: Diagnosis not present

## 2015-06-05 DIAGNOSIS — N186 End stage renal disease: Secondary | ICD-10-CM | POA: Diagnosis not present

## 2015-06-06 DIAGNOSIS — Z86711 Personal history of pulmonary embolism: Secondary | ICD-10-CM | POA: Diagnosis not present

## 2015-06-06 DIAGNOSIS — I2782 Chronic pulmonary embolism: Secondary | ICD-10-CM | POA: Diagnosis not present

## 2015-06-06 DIAGNOSIS — R509 Fever, unspecified: Secondary | ICD-10-CM | POA: Diagnosis not present

## 2015-06-06 DIAGNOSIS — D631 Anemia in chronic kidney disease: Secondary | ICD-10-CM | POA: Diagnosis not present

## 2015-06-06 DIAGNOSIS — N186 End stage renal disease: Secondary | ICD-10-CM | POA: Diagnosis not present

## 2015-06-06 DIAGNOSIS — L0390 Cellulitis, unspecified: Secondary | ICD-10-CM | POA: Diagnosis not present

## 2015-06-06 DIAGNOSIS — T82898D Other specified complication of vascular prosthetic devices, implants and grafts, subsequent encounter: Secondary | ICD-10-CM | POA: Diagnosis not present

## 2015-06-06 DIAGNOSIS — N2581 Secondary hyperparathyroidism of renal origin: Secondary | ICD-10-CM | POA: Diagnosis not present

## 2015-06-08 DIAGNOSIS — L0390 Cellulitis, unspecified: Secondary | ICD-10-CM | POA: Diagnosis not present

## 2015-06-08 DIAGNOSIS — T82898D Other specified complication of vascular prosthetic devices, implants and grafts, subsequent encounter: Secondary | ICD-10-CM | POA: Diagnosis not present

## 2015-06-08 DIAGNOSIS — R509 Fever, unspecified: Secondary | ICD-10-CM | POA: Diagnosis not present

## 2015-06-08 DIAGNOSIS — N186 End stage renal disease: Secondary | ICD-10-CM | POA: Diagnosis not present

## 2015-06-08 DIAGNOSIS — N2581 Secondary hyperparathyroidism of renal origin: Secondary | ICD-10-CM | POA: Diagnosis not present

## 2015-06-08 DIAGNOSIS — D631 Anemia in chronic kidney disease: Secondary | ICD-10-CM | POA: Diagnosis not present

## 2015-06-10 DIAGNOSIS — L0390 Cellulitis, unspecified: Secondary | ICD-10-CM | POA: Diagnosis not present

## 2015-06-10 DIAGNOSIS — N2581 Secondary hyperparathyroidism of renal origin: Secondary | ICD-10-CM | POA: Diagnosis not present

## 2015-06-10 DIAGNOSIS — R509 Fever, unspecified: Secondary | ICD-10-CM | POA: Diagnosis not present

## 2015-06-10 DIAGNOSIS — N186 End stage renal disease: Secondary | ICD-10-CM | POA: Diagnosis not present

## 2015-06-10 DIAGNOSIS — D631 Anemia in chronic kidney disease: Secondary | ICD-10-CM | POA: Diagnosis not present

## 2015-06-10 DIAGNOSIS — T82898D Other specified complication of vascular prosthetic devices, implants and grafts, subsequent encounter: Secondary | ICD-10-CM | POA: Diagnosis not present

## 2015-06-11 DIAGNOSIS — R509 Fever, unspecified: Secondary | ICD-10-CM | POA: Diagnosis not present

## 2015-06-11 DIAGNOSIS — L0390 Cellulitis, unspecified: Secondary | ICD-10-CM | POA: Diagnosis not present

## 2015-06-11 DIAGNOSIS — T82898D Other specified complication of vascular prosthetic devices, implants and grafts, subsequent encounter: Secondary | ICD-10-CM | POA: Diagnosis not present

## 2015-06-11 DIAGNOSIS — N186 End stage renal disease: Secondary | ICD-10-CM | POA: Diagnosis not present

## 2015-06-11 DIAGNOSIS — N2581 Secondary hyperparathyroidism of renal origin: Secondary | ICD-10-CM | POA: Diagnosis not present

## 2015-06-11 DIAGNOSIS — D631 Anemia in chronic kidney disease: Secondary | ICD-10-CM | POA: Diagnosis not present

## 2015-06-13 DIAGNOSIS — T82898D Other specified complication of vascular prosthetic devices, implants and grafts, subsequent encounter: Secondary | ICD-10-CM | POA: Diagnosis not present

## 2015-06-13 DIAGNOSIS — R509 Fever, unspecified: Secondary | ICD-10-CM | POA: Diagnosis not present

## 2015-06-13 DIAGNOSIS — D631 Anemia in chronic kidney disease: Secondary | ICD-10-CM | POA: Diagnosis not present

## 2015-06-13 DIAGNOSIS — N2581 Secondary hyperparathyroidism of renal origin: Secondary | ICD-10-CM | POA: Diagnosis not present

## 2015-06-13 DIAGNOSIS — N186 End stage renal disease: Secondary | ICD-10-CM | POA: Diagnosis not present

## 2015-06-13 DIAGNOSIS — L0390 Cellulitis, unspecified: Secondary | ICD-10-CM | POA: Diagnosis not present

## 2015-06-15 DIAGNOSIS — D631 Anemia in chronic kidney disease: Secondary | ICD-10-CM | POA: Diagnosis not present

## 2015-06-15 DIAGNOSIS — N186 End stage renal disease: Secondary | ICD-10-CM | POA: Diagnosis not present

## 2015-06-15 DIAGNOSIS — L0390 Cellulitis, unspecified: Secondary | ICD-10-CM | POA: Diagnosis not present

## 2015-06-15 DIAGNOSIS — T82898D Other specified complication of vascular prosthetic devices, implants and grafts, subsequent encounter: Secondary | ICD-10-CM | POA: Diagnosis not present

## 2015-06-15 DIAGNOSIS — R509 Fever, unspecified: Secondary | ICD-10-CM | POA: Diagnosis not present

## 2015-06-15 DIAGNOSIS — N2581 Secondary hyperparathyroidism of renal origin: Secondary | ICD-10-CM | POA: Diagnosis not present

## 2015-06-17 ENCOUNTER — Encounter (HOSPITAL_COMMUNITY): Payer: Self-pay | Admitting: *Deleted

## 2015-06-17 ENCOUNTER — Encounter (HOSPITAL_COMMUNITY): Payer: Self-pay | Admitting: Certified Registered Nurse Anesthetist

## 2015-06-17 ENCOUNTER — Inpatient Hospital Stay (HOSPITAL_COMMUNITY)
Admission: EM | Admit: 2015-06-17 | Discharge: 2015-06-18 | DRG: 314 | Disposition: A | Discharge status: Other Acute Inpatient Hospital | Payer: Medicare Other | Attending: Internal Medicine | Admitting: Internal Medicine

## 2015-06-17 DIAGNOSIS — L02419 Cutaneous abscess of limb, unspecified: Secondary | ICD-10-CM | POA: Diagnosis not present

## 2015-06-17 DIAGNOSIS — Z992 Dependence on renal dialysis: Secondary | ICD-10-CM | POA: Diagnosis not present

## 2015-06-17 DIAGNOSIS — Z6841 Body Mass Index (BMI) 40.0 and over, adult: Secondary | ICD-10-CM

## 2015-06-17 DIAGNOSIS — I959 Hypotension, unspecified: Secondary | ICD-10-CM | POA: Diagnosis not present

## 2015-06-17 DIAGNOSIS — G4733 Obstructive sleep apnea (adult) (pediatric): Secondary | ICD-10-CM | POA: Diagnosis not present

## 2015-06-17 DIAGNOSIS — I82409 Acute embolism and thrombosis of unspecified deep veins of unspecified lower extremity: Secondary | ICD-10-CM | POA: Diagnosis present

## 2015-06-17 DIAGNOSIS — E1165 Type 2 diabetes mellitus with hyperglycemia: Secondary | ICD-10-CM | POA: Diagnosis present

## 2015-06-17 DIAGNOSIS — Z79891 Long term (current) use of opiate analgesic: Secondary | ICD-10-CM | POA: Diagnosis not present

## 2015-06-17 DIAGNOSIS — Z79899 Other long term (current) drug therapy: Secondary | ICD-10-CM

## 2015-06-17 DIAGNOSIS — D631 Anemia in chronic kidney disease: Secondary | ICD-10-CM | POA: Diagnosis not present

## 2015-06-17 DIAGNOSIS — R0902 Hypoxemia: Secondary | ICD-10-CM | POA: Diagnosis present

## 2015-06-17 DIAGNOSIS — I10 Essential (primary) hypertension: Secondary | ICD-10-CM | POA: Diagnosis not present

## 2015-06-17 DIAGNOSIS — G9340 Encephalopathy, unspecified: Secondary | ICD-10-CM | POA: Diagnosis not present

## 2015-06-17 DIAGNOSIS — I770 Arteriovenous fistula, acquired: Secondary | ICD-10-CM | POA: Diagnosis not present

## 2015-06-17 DIAGNOSIS — N2581 Secondary hyperparathyroidism of renal origin: Secondary | ICD-10-CM | POA: Diagnosis present

## 2015-06-17 DIAGNOSIS — J9622 Acute and chronic respiratory failure with hypercapnia: Secondary | ICD-10-CM | POA: Diagnosis not present

## 2015-06-17 DIAGNOSIS — Z794 Long term (current) use of insulin: Secondary | ICD-10-CM | POA: Diagnosis not present

## 2015-06-17 DIAGNOSIS — Z888 Allergy status to other drugs, medicaments and biological substances status: Secondary | ICD-10-CM

## 2015-06-17 DIAGNOSIS — Z7901 Long term (current) use of anticoagulants: Secondary | ICD-10-CM

## 2015-06-17 DIAGNOSIS — J9601 Acute respiratory failure with hypoxia: Secondary | ICD-10-CM | POA: Diagnosis not present

## 2015-06-17 DIAGNOSIS — R6889 Other general symptoms and signs: Secondary | ICD-10-CM | POA: Diagnosis not present

## 2015-06-17 DIAGNOSIS — IMO0002 Reserved for concepts with insufficient information to code with codable children: Secondary | ICD-10-CM | POA: Diagnosis present

## 2015-06-17 DIAGNOSIS — E785 Hyperlipidemia, unspecified: Secondary | ICD-10-CM | POA: Diagnosis present

## 2015-06-17 DIAGNOSIS — L0390 Cellulitis, unspecified: Secondary | ICD-10-CM | POA: Diagnosis not present

## 2015-06-17 DIAGNOSIS — T82898A Other specified complication of vascular prosthetic devices, implants and grafts, initial encounter: Secondary | ICD-10-CM | POA: Diagnosis not present

## 2015-06-17 DIAGNOSIS — G40909 Epilepsy, unspecified, not intractable, without status epilepticus: Secondary | ICD-10-CM | POA: Diagnosis not present

## 2015-06-17 DIAGNOSIS — M109 Gout, unspecified: Secondary | ICD-10-CM | POA: Diagnosis present

## 2015-06-17 DIAGNOSIS — T827XXD Infection and inflammatory reaction due to other cardiac and vascular devices, implants and grafts, subsequent encounter: Secondary | ICD-10-CM | POA: Diagnosis not present

## 2015-06-17 DIAGNOSIS — L02413 Cutaneous abscess of right upper limb: Secondary | ICD-10-CM | POA: Diagnosis present

## 2015-06-17 DIAGNOSIS — L02414 Cutaneous abscess of left upper limb: Secondary | ICD-10-CM | POA: Diagnosis present

## 2015-06-17 DIAGNOSIS — N186 End stage renal disease: Secondary | ICD-10-CM | POA: Diagnosis not present

## 2015-06-17 DIAGNOSIS — Y832 Surgical operation with anastomosis, bypass or graft as the cause of abnormal reaction of the patient, or of later complication, without mention of misadventure at the time of the procedure: Secondary | ICD-10-CM | POA: Diagnosis present

## 2015-06-17 DIAGNOSIS — R6520 Severe sepsis without septic shock: Secondary | ICD-10-CM | POA: Diagnosis not present

## 2015-06-17 DIAGNOSIS — Z89011 Acquired absence of right thumb: Secondary | ICD-10-CM

## 2015-06-17 DIAGNOSIS — Z86718 Personal history of other venous thrombosis and embolism: Secondary | ICD-10-CM | POA: Diagnosis not present

## 2015-06-17 DIAGNOSIS — A4101 Sepsis due to Methicillin susceptible Staphylococcus aureus: Secondary | ICD-10-CM | POA: Diagnosis not present

## 2015-06-17 DIAGNOSIS — T827XXA Infection and inflammatory reaction due to other cardiac and vascular devices, implants and grafts, initial encounter: Principal | ICD-10-CM | POA: Diagnosis present

## 2015-06-17 DIAGNOSIS — R791 Abnormal coagulation profile: Secondary | ICD-10-CM | POA: Diagnosis present

## 2015-06-17 DIAGNOSIS — J9602 Acute respiratory failure with hypercapnia: Secondary | ICD-10-CM | POA: Diagnosis not present

## 2015-06-17 DIAGNOSIS — J9621 Acute and chronic respiratory failure with hypoxia: Secondary | ICD-10-CM | POA: Diagnosis not present

## 2015-06-17 DIAGNOSIS — E662 Morbid (severe) obesity with alveolar hypoventilation: Secondary | ICD-10-CM | POA: Diagnosis not present

## 2015-06-17 DIAGNOSIS — I120 Hypertensive chronic kidney disease with stage 5 chronic kidney disease or end stage renal disease: Secondary | ICD-10-CM | POA: Diagnosis present

## 2015-06-17 DIAGNOSIS — Z89021 Acquired absence of right finger(s): Secondary | ICD-10-CM

## 2015-06-17 DIAGNOSIS — R609 Edema, unspecified: Secondary | ICD-10-CM | POA: Diagnosis not present

## 2015-06-17 DIAGNOSIS — R509 Fever, unspecified: Secondary | ICD-10-CM | POA: Diagnosis not present

## 2015-06-17 DIAGNOSIS — T827XXS Infection and inflammatory reaction due to other cardiac and vascular devices, implants and grafts, sequela: Secondary | ICD-10-CM | POA: Diagnosis not present

## 2015-06-17 DIAGNOSIS — E876 Hypokalemia: Secondary | ICD-10-CM | POA: Diagnosis not present

## 2015-06-17 DIAGNOSIS — R569 Unspecified convulsions: Secondary | ICD-10-CM

## 2015-06-17 DIAGNOSIS — T82898D Other specified complication of vascular prosthetic devices, implants and grafts, subsequent encounter: Secondary | ICD-10-CM | POA: Diagnosis not present

## 2015-06-17 DIAGNOSIS — E119 Type 2 diabetes mellitus without complications: Secondary | ICD-10-CM | POA: Diagnosis not present

## 2015-06-17 HISTORY — DX: Cutaneous abscess of right upper limb: L02.413

## 2015-06-17 HISTORY — DX: Type 2 diabetes mellitus with hyperglycemia: E11.65

## 2015-06-17 HISTORY — DX: Reserved for concepts with insufficient information to code with codable children: IMO0002

## 2015-06-17 LAB — CBC WITH DIFFERENTIAL/PLATELET
Basophils Absolute: 0.1 10*3/uL (ref 0.0–0.1)
Basophils Relative: 1 % (ref 0–1)
EOS ABS: 0.3 10*3/uL (ref 0.0–0.7)
EOS PCT: 3 % (ref 0–5)
HCT: 36.8 % — ABNORMAL LOW (ref 39.0–52.0)
Hemoglobin: 11.9 g/dL — ABNORMAL LOW (ref 13.0–17.0)
LYMPHS ABS: 2.5 10*3/uL (ref 0.7–4.0)
Lymphocytes Relative: 24 % (ref 12–46)
MCH: 34 pg (ref 26.0–34.0)
MCHC: 32.3 g/dL (ref 30.0–36.0)
MCV: 105.1 fL — ABNORMAL HIGH (ref 78.0–100.0)
Monocytes Absolute: 0.9 10*3/uL (ref 0.1–1.0)
Monocytes Relative: 8 % (ref 3–12)
Neutro Abs: 6.7 10*3/uL (ref 1.7–7.7)
Neutrophils Relative %: 64 % (ref 43–77)
PLATELETS: 317 10*3/uL (ref 150–400)
RBC: 3.5 MIL/uL — AB (ref 4.22–5.81)
RDW: 16.2 % — AB (ref 11.5–15.5)
WBC: 10.5 10*3/uL (ref 4.0–10.5)

## 2015-06-17 LAB — PROTIME-INR
INR: 2.36 — ABNORMAL HIGH (ref 0.00–1.49)
INR: 2.12 — ABNORMAL HIGH (ref 0.00–1.49)
PROTHROMBIN TIME: 25.5 s — AB (ref 11.6–15.2)
PROTHROMBIN TIME: 23.6 s — AB (ref 11.6–15.2)

## 2015-06-17 LAB — I-STAT CG4 LACTIC ACID, ED: Lactic Acid, Venous: 1.55 mmol/L (ref 0.5–2.0)

## 2015-06-17 LAB — BASIC METABOLIC PANEL
Anion gap: 19 — ABNORMAL HIGH (ref 5–15)
BUN: 34 mg/dL — AB (ref 6–20)
CALCIUM: 8.8 mg/dL — AB (ref 8.9–10.3)
CO2: 23 mmol/L (ref 22–32)
CREATININE: 8.12 mg/dL — AB (ref 0.61–1.24)
Chloride: 94 mmol/L — ABNORMAL LOW (ref 101–111)
GFR calc Af Amer: 8 mL/min — ABNORMAL LOW (ref >60)
GFR, EST NON AFRICAN AMERICAN: 7 mL/min — AB (ref >60)
Glucose, Bld: 186 mg/dL — ABNORMAL HIGH (ref 65–99)
POTASSIUM: 3 mmol/L — AB (ref 3.5–5.1)
SODIUM: 136 mmol/L (ref 135–145)

## 2015-06-17 LAB — GLUCOSE, CAPILLARY: Glucose-Capillary: 249 mg/dL — ABNORMAL HIGH (ref 65–99)

## 2015-06-17 LAB — TYPE AND SCREEN
ABO/RH(D): A POS
ANTIBODY SCREEN: NEGATIVE

## 2015-06-17 MED ORDER — ONDANSETRON HCL 4 MG/2ML IJ SOLN
4.0000 mg | Freq: Four times a day (QID) | INTRAMUSCULAR | Status: DC | PRN
Start: 2015-06-17 — End: 2015-06-18

## 2015-06-17 MED ORDER — HEPARIN (PORCINE) IN NACL 100-0.45 UNIT/ML-% IJ SOLN
2000.0000 [IU]/h | INTRAMUSCULAR | Status: DC
  Administered 2015-06-18: 2000 [IU]/h via INTRAVENOUS
  Filled 2015-06-17: qty 250

## 2015-06-17 MED ORDER — ALLOPURINOL 300 MG PO TABS
300.0000 mg | ORAL_TABLET | Freq: Two times a day (BID) | ORAL | Status: DC
  Administered 2015-06-17: 300 mg via ORAL
  Filled 2015-06-17: qty 1

## 2015-06-17 MED ORDER — CALCIUM ACETATE (PHOS BINDER) 667 MG PO CAPS
2001.0000 mg | ORAL_CAPSULE | Freq: Three times a day (TID) | ORAL | Status: DC
  Administered 2015-06-17: 2001 mg via ORAL
  Filled 2015-06-17: qty 3

## 2015-06-17 MED ORDER — PANTOPRAZOLE SODIUM 40 MG PO TBEC
40.0000 mg | DELAYED_RELEASE_TABLET | Freq: Every day | ORAL | Status: DC
  Filled 2015-06-17: qty 1

## 2015-06-17 MED ORDER — INSULIN ASPART 100 UNIT/ML ~~LOC~~ SOLN: 0.0000 [IU] | Freq: Three times a day (TID) | SUBCUTANEOUS | Status: DC

## 2015-06-17 MED ORDER — ONDANSETRON HCL 4 MG PO TABS: 4.0000 mg | ORAL_TABLET | Freq: Four times a day (QID) | ORAL | Status: DC | PRN

## 2015-06-17 MED ORDER — GUAIFENESIN-DM 100-10 MG/5ML PO SYRP: 5.0000 mL | ORAL_SOLUTION | ORAL | Status: DC | PRN

## 2015-06-17 MED ORDER — FUROSEMIDE 10 MG/ML IJ SOLN: 10.0000 mg | INTRAMUSCULAR | Status: DC

## 2015-06-17 MED ORDER — AMLODIPINE BESYLATE 5 MG PO TABS
5.0000 mg | ORAL_TABLET | Freq: Every day | ORAL | Status: DC
  Filled 2015-06-17: qty 1

## 2015-06-17 MED ORDER — PIPERACILLIN-TAZOBACTAM IN DEX 2-0.25 GM/50ML IV SOLN
2.2500 g | Freq: Three times a day (TID) | INTRAVENOUS | Status: DC
  Administered 2015-06-17 – 2015-06-18 (×2): 2.25 g via INTRAVENOUS
  Filled 2015-06-17 (×4): qty 50

## 2015-06-17 MED ORDER — INSULIN ASPART 100 UNIT/ML ~~LOC~~ SOLN
0.0000 [IU] | Freq: Every day | SUBCUTANEOUS | Status: DC
  Administered 2015-06-17: 2 [IU] via SUBCUTANEOUS

## 2015-06-17 MED ORDER — METOPROLOL TARTRATE 1 MG/ML IV SOLN: 5.0000 mg | INTRAVENOUS | Status: DC | PRN

## 2015-06-17 MED ORDER — CLONAZEPAM 1 MG PO TABS
1.0000 mg | ORAL_TABLET | Freq: Two times a day (BID) | ORAL | Status: DC
  Administered 2015-06-17: 1 mg via ORAL
  Filled 2015-06-17: qty 1

## 2015-06-17 MED ORDER — INSULIN ASPART PROT & ASPART (70-30 MIX) 100 UNIT/ML ~~LOC~~ SUSP
30.0000 [IU] | Freq: Three times a day (TID) | SUBCUTANEOUS | Status: DC
  Filled 2015-06-17: qty 10

## 2015-06-17 MED ORDER — ATORVASTATIN CALCIUM 10 MG PO TABS
10.0000 mg | ORAL_TABLET | Freq: Every day | ORAL | Status: DC
  Filled 2015-06-17: qty 1

## 2015-06-17 MED ORDER — SODIUM CHLORIDE 0.9 % IV SOLN
Freq: Once | INTRAVENOUS | Status: AC
  Administered 2015-06-17: 18:00:00 via INTRAVENOUS

## 2015-06-17 MED ORDER — SODIUM CHLORIDE 0.9 % IJ SOLN: 3.0000 mL | Freq: Two times a day (BID) | INTRAMUSCULAR | Status: DC

## 2015-06-17 MED ORDER — DEXTROSE 5 % IV SOLN
2.0000 mg | Freq: Once | INTRAVENOUS | Status: AC
  Administered 2015-06-17: 2 mg via INTRAVENOUS
  Filled 2015-06-17: qty 0.2

## 2015-06-17 MED ORDER — CINACALCET HCL 30 MG PO TABS
60.0000 mg | ORAL_TABLET | Freq: Every day | ORAL | Status: DC
Start: 2015-06-18 — End: 2015-06-18
  Filled 2015-06-17 (×2): qty 2

## 2015-06-17 MED ORDER — CEFAZOLIN SODIUM 1-5 GM-% IV SOLN: 1.0000 g | INTRAVENOUS | Status: DC

## 2015-06-17 MED ORDER — OXYCODONE HCL 5 MG PO TABS: 10.0000 mg | ORAL_TABLET | Freq: Four times a day (QID) | ORAL | Status: DC | PRN

## 2015-06-17 MED ORDER — GABAPENTIN 300 MG PO CAPS
300.0000 mg | ORAL_CAPSULE | Freq: Three times a day (TID) | ORAL | Status: DC
  Administered 2015-06-17: 300 mg via ORAL
  Filled 2015-06-17: qty 1

## 2015-06-17 MED ORDER — HYDROCODONE-ACETAMINOPHEN 5-325 MG PO TABS: 1.0000 | ORAL_TABLET | ORAL | Status: DC | PRN

## 2015-06-17 MED ORDER — FUROSEMIDE 10 MG/ML IJ SOLN
10.0000 mg | INTRAMUSCULAR | Status: AC
  Administered 2015-06-18: 10 mg via INTRAVENOUS
  Filled 2015-06-17: qty 2

## 2015-06-17 MED ORDER — LANTHANUM CARBONATE 500 MG PO CHEW: 1000.0000 mg | CHEWABLE_TABLET | Freq: Two times a day (BID) | ORAL | Status: DC

## 2015-06-17 MED ORDER — HYDRALAZINE HCL 20 MG/ML IJ SOLN: 10.0000 mg | Freq: Four times a day (QID) | INTRAMUSCULAR | Status: DC | PRN

## 2015-06-17 MED ORDER — INSULIN GLARGINE 100 UNIT/ML ~~LOC~~ SOLN
60.0000 [IU] | Freq: Every day | SUBCUTANEOUS | Status: DC
  Filled 2015-06-17 (×3): qty 0.6

## 2015-06-17 MED ORDER — COLCHICINE 0.6 MG PO TABS
0.6000 mg | ORAL_TABLET | Freq: Every day | ORAL | Status: DC
  Filled 2015-06-17: qty 1

## 2015-06-17 MED ORDER — RENA-VITE PO TABS
1.0000 | ORAL_TABLET | Freq: Every day | ORAL | Status: DC
  Administered 2015-06-17: 1 via ORAL
  Filled 2015-06-17: qty 1

## 2015-06-17 NOTE — Consult Note (Addendum)
[facility]ISTORY [facility] PHYSICAL   History of Present Illness:  Patient is a 49 y.o. year old male who presents for evaluation of infected left arm AVF.  Pt has had redness and drainage over fistula for several days.  He has also had some fever.  He dialyzes at [facility].  He has had 6 Viabahn stents placed in [facility] July 2015 extending over the full course of the fistula.  Other medical problems include diabetes, sleep apnea, DVT and is on chronic coumadin.  Past Medical History  Diagnosis Date  . Diabetes mellitus     diabetic ketoacidosis  . Morbid obesity   . OSA (obstructive sleep apnea)   . Septic shock(785.52)   . Anemia   . Hypertension   . Seizures   . Gout   . ESRD (end stage renal disease)   . LOC (loss of consciousness)   . DVT (deep venous thrombosis) 2011  . Cardiomegaly   . Chronic anticoagulation   . Chronic pancreatitis   . Secondary hyperparathyroidism     Past Surgical History  Procedure Laterality Date  . Av fistula placement    . Amputation finger / thumb Right     Social History Social History  Substance Use Topics  . Smoking status: Never Smoker   . Smokeless tobacco: Never Used  . Alcohol Use: Yes     Comment: Occasional wine use    Family History Family History  Problem Relation Age of Onset  . Diabetes Mother   . Hypertension Mother   . Diabetes Father   . Hypertension Father   . Hypertension Brother     Allergies  Allergies  Allergen Reactions  . Aspirin Other (See Comments)    Stomach problems     No current facility-administered medications for this encounter.   Current Outpatient Prescriptions  Medication Sig Dispense Refill  . allopurinol (ZYLOPRIM) 300 MG tablet Take 300 mg by mouth 2 (two) times daily.    Marland Kitchen AMLODIPINE BESYLATE PO Take 5 mg by mouth daily. Call pharmacy to verify dosage    . atorvastatin (LIPITOR) 10 MG tablet Take 10 mg by mouth daily.    . calcium acetate  (PHOSLO) 667 MG capsule Take 2,001 mg by mouth 3 (three) times daily with meals.     . cinacalcet (SENSIPAR) 60 MG tablet Take 60 mg by mouth daily.    . clonazePAM (KLONOPIN) 1 MG tablet Take 1 mg by mouth 2 (two) times daily.    . colchicine 0.6 MG tablet Take 0.6 mg by mouth daily.    Marland Kitchen gabapentin (NEURONTIN) 300 MG capsule Take 300 mg by mouth every 8 (eight) hours.    . hydrOXYzine (ATARAX/VISTARIL) 25 MG tablet Take 25 mg by mouth 3 (three) times daily as needed.    . insulin aspart protamine-insulin aspart (NOVOLOG 70/30) (70-30) 100 UNIT/ML injection Inject 35 Units into the skin 3 (three) times daily with meals.     . insulin glargine (LANTUS) 100 UNIT/ML injection Inject 70 Units into the skin at bedtime.     Marland Kitchen lanthanum (FOSRENOL) 1000 MG chewable tablet Chew 1,000 mg by mouth 2 (two) times daily with a meal.    . multivitamin (RENA-VIT) TABS tablet daily.  3  . neomycin-polymyxin-dexamethasone (MAXITROL) 0.1 % ophthalmic suspension 1 drop 4 (four) times daily.    Marland Kitchen omeprazole (PRILOSEC) 20 MG capsule Take 20 mg by mouth daily.      . Oxycodone HCl 10 MG TABS  Take 10 mg by mouth every 6 (six) hours as needed.    . warfarin (COUMADIN) 7.5 MG tablet Take 7.5 mg by mouth as directed.       ROS:   General:  No weight loss, +Fever,no  chills  HEENT: No recent headaches, no nasal bleeding, no visual changes, no sore throat  Neurologic: No dizziness, blackouts, seizures. No recent symptoms of stroke or mini- stroke. No recent episodes of slurred speech, or temporary blindness.  Cardiac: No recent episodes of chest pain/pressure, no shortness of breath at rest.  + shortness of breath with exertion.  Denies history of atrial fibrillation or irregular heartbeat  Vascular: No history of rest pain in feet.  No history of claudication.  No history of non-healing ulcer, No history of DVT   Pulmonary: No home oxygen, no productive cough, no hemoptysis,  No asthma or  wheezing  Musculoskeletal:   Arthritis,  Low back pain,   Joint pain  Hematologic:No history of hypercoagulable state.  No history of easy bleeding.  No history of anemia  Gastrointestinal: No hematochezia or melena,  No gastroesophageal reflux, no trouble swallowing  Urinary: [x ] chronic Kidney disease,  on HD - [ x] MWF or  TTHS,  Burning with urination,  Frequent urination,  Difficulty urinating;   Skin: No rashes  Psychological: No history of anxiety,  No history of depression   Physical Examination  Filed Vitals:   06/17/15 1225  BP: 131/100  Pulse: 101  Temp: 98.8 F (37.1 C)  TempSrc: Oral  Resp: 18  SpO2: 93%    There is no weight on file to calculate BMI.  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  Will keep NPO for now.  If his INR is > 1.7 will need admission to medical service for correction with FFP.  If  INR < 1.7 will remove today.  Fabienne Bruns, MD [facility] [place] Office: [phone] Pager: [phone]

## 2015-06-17 NOTE — ED Notes (Signed)
Pt removed/refusing O2 sensor again at this time, will monitor, pt NAD.

## 2015-06-17 NOTE — Consult Note (Signed)
INR 2.4 Would recommend admission to medical service for correction of INR and IV antibiotics Will plan to remove graft tomorrow if INR < 1.7  NPO p midnight Consent  Fabienne Bruns, MD [facility] [place] Office: [phone] Pager: [phone]

## 2015-06-17 NOTE — Consult Note (Signed)
ANTICOAGULATION CONSULT NOTE - Initial Consult  Pharmacy Consult for heparin Indication: DVT  Allergies  Allergen Reactions  . Aspirin Other (See Comments)    Stomach problems    Patient Measurements: Height:  (180.3 cm) Weight: (!) 429 lb 14.4 oz (195 kg) IBW/kg (Calculated) : 75.3 Heparin Dosing Weight: 124 kg  Vital Signs: Temp: 98.7 F (37.1 C) (09/12 1759) Temp Source: Oral (09/12 1759) BP: 148/100 mmHg (09/12 1759) Pulse Rate: 98 (09/12 1745)  Labs:  Recent Labs  06/17/15 1330 06/17/15 2035  HGB 11.9*  --   HCT 36.8*  --   PLT 317  --   LABPROT 25.5* 23.6*  INR 2.36* 2.12*  CREATININE 8.12*  --     Estimated Creatinine Clearance: 19.4 mL/min (by C-G formula based on Cr of 8.12).  Assessment: 49yo with ESRD on HD Mon,Tue,Thu,Sat (verified 4x/week) with patient presents with infected left arm AVF. He has been getting vanc/ceftazidime outpatient for the same. He is also on coumadin pta for hx DVT. Coumadin to be held, INR to be reversed for graft removal once INR < 1.7.  PMH: DM, HTN, seizures, gout, ESRD, DVT, chronic pancreatitis  INR now 2.12.   Goal of Therapy:  Heparin level 0.3-0.7 units/ml Monitor platelets by anticoagulation protocol: Yes   Plan:  Will Initiate heparin without a bolus assuming INR will continue to decrease and be < 2 within the next few hrs  Initiate heparin at 2000 units/hr 0400 HL Daily CBC Monitor for s/sx of bleeding  Isaac Bliss, PharmD, BCPS Clinical Pharmacist Pager [phone] 06/17/2015 9:20 PM

## 2015-06-17 NOTE — ED Notes (Signed)
Attempted report 

## 2015-06-17 NOTE — ED Notes (Addendum)
Pt had full dialysis treatment today but has infected access site, they sent him here due to it having leakage and drainage. Swelling noted to left upper arm, reports fever this weekend.

## 2015-06-17 NOTE — ED Provider Notes (Addendum)
CSN: [id number]     Arrival date & time 06/17/15  1152 History   First MD Initiated Contact with Patient 06/17/15 1305     Chief Complaint  Patient presents with  . Vascular Access Problem     (Consider location/radiation/quality/duration/timing/severity/associated sxs/prior Treatment) HPI Comments: Patient sent to the emergency department by nephrology for evaluation of infection of AV fistula. Patient has a left upper arm basilic vein transposition fistula, place 2011 by Dr. Hart [place]. Patient reports that over this past week he has had pain, swelling and drainage from the site. He had previously been running a fever last week, but has been receiving antibiotic's with dialysis this past week and a fever has resolved.   Past Medical History  Diagnosis Date  . Diabetes mellitus     diabetic ketoacidosis  . Morbid obesity   . OSA (obstructive sleep apnea)   . Septic shock(785.52)   . Anemia   . Hypertension   . Seizures   . Gout   . ESRD (end stage renal disease)   . LOC (loss of consciousness)   . DVT (deep venous thrombosis) 2011  . Cardiomegaly   . Chronic anticoagulation   . Chronic pancreatitis   . Secondary hyperparathyroidism    Past Surgical History  Procedure Laterality Date  . Av fistula placement    . Amputation finger / thumb Right    Family History  Problem Relation Age of Onset  . Diabetes Mother   . Hypertension Mother   . Diabetes Father   . Hypertension Father   . Hypertension Brother    Social History  Substance Use Topics  . Smoking status: Never Smoker   . Smokeless tobacco: Never Used  . Alcohol Use: Yes     Comment: Occasional wine use    Review of Systems  Constitutional: Positive for fever.  Skin: Positive for wound.  All other systems reviewed and are negative.     Allergies  Aspirin  Home Medications   Prior to Admission medications   Medication Sig Start Date End Date Taking? Authorizing Provider  allopurinol (ZYLOPRIM) 300 MG  tablet Take 300 mg by mouth 2 (two) times daily.    Historical Provider, MD  AMLODIPINE BESYLATE PO Take 5 mg by mouth daily. Call pharmacy to verify dosage    Historical Provider, MD  atorvastatin (LIPITOR) 10 MG tablet Take 10 mg by mouth daily.    Historical Provider, MD  calcium acetate (PHOSLO) 667 MG capsule Take 2,001 mg by mouth 3 (three) times daily with meals.     Historical Provider, MD  cinacalcet (SENSIPAR) 60 MG tablet Take 60 mg by mouth daily.    Historical Provider, MD  clonazePAM (KLONOPIN) 1 MG tablet Take 1 mg by mouth 2 (two) times daily.    Historical Provider, MD  colchicine 0.6 MG tablet Take 0.6 mg by mouth daily.    Historical Provider, MD  gabapentin (NEURONTIN) 300 MG capsule Take 300 mg by mouth every 8 (eight) hours.    Historical Provider, MD  hydrOXYzine (ATARAX/VISTARIL) 25 MG tablet Take 25 mg by mouth 3 (three) times daily as needed.    Historical Provider, MD  insulin aspart protamine-insulin aspart (NOVOLOG 70/30) (70-30) 100 UNIT/ML injection Inject 35 Units into the skin 3 (three) times daily with meals.     Historical Provider, MD  insulin glargine (LANTUS) 100 UNIT/ML injection Inject 70 Units into the skin at bedtime.     Historical Provider, MD  lanthanum (FOSRENOL) 1000 MG chewable  tablet Chew 1,000 mg by mouth 2 (two) times daily with a meal.    Historical Provider, MD  multivitamin (RENA-VIT) TABS tablet daily. 12/05/14   Historical Provider, MD  neomycin-polymyxin-dexamethasone (MAXITROL) 0.1 % ophthalmic suspension 1 drop 4 (four) times daily.    Historical Provider, MD  omeprazole (PRILOSEC) 20 MG capsule Take 20 mg by mouth daily.      Historical Provider, MD  Oxycodone HCl 10 MG TABS Take 10 mg by mouth every 6 (six) hours as needed.    Historical Provider, MD  warfarin (COUMADIN) 7.5 MG tablet Take 7.5 mg by mouth as directed.     Historical Provider, MD   BP 131/100 mmHg  Pulse 101  Temp(Src) 98.8 F (37.1 C) (Oral)  Resp 18  SpO2  93% Physical Exam  Constitutional: He is oriented to person, place, and time. He appears well-developed and well-nourished. No distress.  HENT:  Head: Normocephalic and atraumatic.  Right Ear: Hearing normal.  Left Ear: Hearing normal.  Nose: Nose normal.  Mouth/Throat: Oropharynx is clear and moist and mucous membranes are normal.  Eyes: Conjunctivae and EOM are normal. Pupils are equal, round, and reactive to light.  Neck: Normal range of motion. Neck supple.  Cardiovascular: Regular rhythm, S1 normal and S2 normal.  Exam reveals no gallop and no friction rub.   No murmur heard. Pulmonary/Chest: Effort normal and breath sounds normal. No respiratory distress. He exhibits no tenderness.  Abdominal: Soft. Normal appearance and bowel sounds are normal. There is no hepatosplenomegaly. There is no tenderness. There is no rebound, no guarding, no tenderness at McBurney's point and negative Murphy's sign. No hernia.  Musculoskeletal: Normal range of motion.  Neurological: He is alert and oriented to person, place, and time. He has normal strength. No cranial nerve deficit or sensory deficit. Coordination normal. GCS eye subscore is 4. GCS verbal subscore is 5. GCS motor subscore is 6.  Skin: Skin is warm, dry and intact. No rash noted. There is erythema. No cyanosis.     Psychiatric: He has a normal mood and affect. His speech is normal and behavior is normal. Thought content normal.  Nursing note and vitals reviewed.   ED Course  Procedures (including critical care time) Labs Review Labs Reviewed  CULTURE, BLOOD (ROUTINE X 2)  CULTURE, BLOOD (ROUTINE X 2)  CULTURE, ROUTINE-ABSCESS  CBC WITH DIFFERENTIAL/PLATELET  BASIC METABOLIC PANEL  PROTIME-INR  I-STAT CG4 LACTIC ACID, ED    Imaging Review No results found. I have personally reviewed and evaluated these images and lab results as part of my medical decision-making.   EKG Interpretation None      MDM   Final diagnoses:   None  abscess AV fistula infection  Patient presents to the ER for evaluation of infection of his dialysis fistula. Patient has been expressing fever, chills and purulent drainage from his fistula for 1 week. He has been receiving antibiotic with dialysis but the area has not improved. Examination today reveals significant abscess formation over the fistula. Discussed briefly with Dr. Darrick Penna, will see the patient in the ER.  Addendum: Discussed briefly with Dr. Lowell Guitar, on call for nephrology. Will follow in consultation.  Gilda Crease, MD 06/17/15 1321  Gilda Crease, MD 06/17/15 1536

## 2015-06-17 NOTE — H&P (Signed)
Patient Demographics:    Shane Hamilton, is a 49 y.o. male  MRN: [id number]   DOB - 1965-11-04  Admit Date - 06/17/2015  Outpatient Primary Hamilton for the patient is DETERDING,Shane Hamilton   With History of -  Past Medical History  Diagnosis Date  . Type 2 diabetes mellitus, uncontrolled        . Morbid obesity   . OSA (obstructive sleep apnea)   . Septic shock(785.52)   . Anemia   . Hypertension   . Seizures   . Gout   . ESRD (end stage renal disease)   . LOC (loss of consciousness)   . DVT (deep venous thrombosis) 2011  . Cardiomegaly   . Chronic anticoagulation   . Chronic pancreatitis   . Secondary hyperparathyroidism       Past Surgical History  Procedure Laterality Date  . Av fistula placement    . Amputation finger / thumb Right     in for   Chief Complaint  Patient presents with  . Vascular Access Problem      HPI:    Shane Hamilton  is a 49 y.o. male, with history of morbid obesity, obstructive sleep apnea does not wear C Pap, ESRD undergoes dialysis under the care of Shane Hamilton, anemia of chronic disease, seizures, essential hypertension, DVT 5 years ago in bilateral lower extremities, type 2 diabetes mellitus, chronic anticoagulation with Coumadin whose been having issues with his left arm fistula with signs of infection for the last 7-10 days, he's been on IV anti-Bartek's during dialysis outpatient, he does not recall the anti-biotic name but says he gets 2 bags with everyone. After pharmacy review it was found that patient was getting vancomycin and ceftaz.   He was sent to the ER to be evaluated by vascular surgery for possible left arm fistula site abscess, in the ER he was seen by Shane Hamilton who has scheduled him for a fistula ligation surgery tomorrow morning hospitalist  team was requested to admit the patient since his INR was in therapeutic range.   Patient besides some left arm pain, swelling warmth and tenderness does not have any subjective complaints, he says he's been feeling somewhat weak for the last 7-10 days but no fever or chills, denies any headache, no chest pain cough and shortness of breath, no abdominal pain or discomfort, no diarrhea, no blood in stool, no focal weakness.    Review of systems:    In addition to the HPI above,   No Fever-chills, No Headache, No changes with Vision or hearing, No problems swallowing food or Liquids, No Chest pain, Cough or Shortness of Breath, No Abdominal pain, No Nausea or Vommitting, Bowel movements are regular, No Blood in stool or Urine, No dysuria, No new skin rashes or bruises, No new joints pains-aches, except left arm swelling along with pain and warmth as above No new weakness, tingling, numbness  in any extremity, No recent weight gain or loss, No polyuria, polydypsia or polyphagia, No significant Mental Stressors.  A full 10 point Review of Systems was done, except as stated above, all other Review of Systems were negative.    Social History:     Social History  Substance Use Topics  . Smoking status: Never Smoker   . Smokeless tobacco: Never Used  . Alcohol Use: Yes     Comment: Occasional wine use    Lives - lives at home with his wife      Family History :     Family History  Problem Relation Age of Onset  . Diabetes Mother   . Hypertension Mother   . Diabetes Father   . Hypertension Father   . Hypertension Brother        Home Medications:   Prior to Admission medications   Medication Sig Start Date End Date Taking? Authorizing Provider  allopurinol (ZYLOPRIM) 300 MG tablet Take 300 mg by mouth 2 (two) times daily.   Yes Historical Provider, Hamilton  amLODipine (NORVASC) 5 MG tablet Take 5 mg by mouth daily.   Yes Historical Provider, Hamilton  atorvastatin (LIPITOR) 10  MG tablet Take 10 mg by mouth daily.   Yes Historical Provider, Hamilton  calcium acetate (PHOSLO) 667 MG capsule Take 2,001 mg by mouth 3 (three) times daily with meals.    Yes Historical Provider, Hamilton  cinacalcet (SENSIPAR) 60 MG tablet Take 60 mg by mouth daily.   Yes Historical Provider, Hamilton  clonazePAM (KLONOPIN) 1 MG tablet Take 1 mg by mouth 2 (two) times daily.   Yes Historical Provider, Hamilton  colchicine 0.6 MG tablet Take 0.6 mg by mouth daily.   Yes Historical Provider, Hamilton  gabapentin (NEURONTIN) 300 MG capsule Take 300 mg by mouth every 8 (eight) hours.   Yes Historical Provider, Hamilton  hydrOXYzine (ATARAX/VISTARIL) 25 MG tablet Take 25 mg by mouth 3 (three) times daily as needed.   Yes Historical Provider, Hamilton  insulin aspart protamine-insulin aspart (NOVOLOG 70/30) (70-30) 100 UNIT/ML injection Inject 35 Units into the skin 3 (three) times daily with meals.    Yes Historical Provider, Hamilton  insulin glargine (LANTUS) 100 UNIT/ML injection Inject 70 Units into the skin at bedtime.    Yes Historical Provider, Hamilton  lanthanum (FOSRENOL) 1000 MG chewable tablet Chew 1,000 mg by mouth 2 (two) times daily with a meal.   Yes Historical Provider, Hamilton  multivitamin (RENA-VIT) TABS tablet Take 1 tablet by mouth daily.  12/05/14  Yes Historical Provider, Hamilton  omeprazole (PRILOSEC) 20 MG capsule Take 20 mg by mouth 2 (two) times daily before a meal.    Yes Historical Provider, Hamilton  Oxycodone HCl 10 MG TABS Take 10 mg by mouth every 6 (six) hours as needed (pain).    Yes Historical Provider, Hamilton  warfarin (COUMADIN) 7.5 MG tablet Take 7.5 mg by mouth See admin instructions. Pt takes 7.5mg  on Tuesday, Wednesday, Thursday, Saturday and Sunday and 10mg  on Monday and Friday   Yes Historical Provider, Hamilton     Allergies:     Allergies  Allergen Reactions  . Aspirin Other (See Comments)    Stomach problems     Physical Exam:   Vitals  Blood pressure 131/100, pulse 101, temperature 98.8 F (37.1 C), temperature source  Oral, resp. rate 18, SpO2 93 %.   1. General morbidly obese African-American male sitting in hospital bed in NAD,     2. Normal affect  and insight, Not Suicidal or Homicidal, Awake Alert, Oriented X 3.  3. No F.N deficits, ALL C.Nerves Intact, Strength 5/5 all 4 extremities, Sensation intact all 4 extremities, Plantars down going.  4. Ears and Eyes appear Normal, Conjunctivae clear, PERRLA. Moist Oral Mucosa.  5. Supple Neck, No JVD, No cervical lymphadenopathy appriciated, No Carotid Bruits.  6. Symmetrical Chest wall movement, Good air movement bilaterally, CTAB.  7. RRR, No Gallops, Rubs or Murmurs, No Parasternal Heave.  8. Positive Bowel Sounds, Abdomen Soft, No tenderness, No organomegaly appriciated,No rebound -guarding or rigidity.  9.  No Cyanosis, Normal Skin Turgor, No Skin Rash or Bruise. Left arm AV fistula site has clear fluctuance, warmth and tenderness, findings suggestive of abscess.  10. Good muscle tone,  joints appear normal , no effusions, Normal ROM.  11. No Palpable Lymph Nodes in Neck or Axillae      Data Review:    CBC  Recent Labs Lab 06/17/15 1330  WBC 10.5  HGB 11.9*  HCT 36.8*  PLT 317  MCV 105.1*  MCH 34.0  MCHC 32.3  RDW 16.2*  LYMPHSABS 2.5  MONOABS 0.9  EOSABS 0.3  BASOSABS 0.1   ------------------------------------------------------------------------------------------------------------------  Chemistries   Recent Labs Lab 06/17/15 1330  NA 136  K 3.0*  CL 94*  CO2 23  GLUCOSE 186*  BUN 34*  CREATININE 8.12*  CALCIUM 8.8*   ------------------------------------------------------------------------------------------------------------------ CrCl cannot be calculated (Unknown ideal weight.). ------------------------------------------------------------------------------------------------------------------ No results for input(s): TSH, T4TOTAL, T3FREE, THYROIDAB in the last 72 hours.  Invalid input(s):  FREET3   Coagulation profile  Recent Labs Lab 06/17/15 1330  INR 2.36*   ------------------------------------------------------------------------------------------------------------------- No results for input(s): DDIMER in the last 72 hours. -------------------------------------------------------------------------------------------------------------------  Cardiac Enzymes No results for input(s): CKMB, TROPONINI, MYOGLOBIN in the last 168 hours.  Invalid input(s): CK ------------------------------------------------------------------------------------------------------------------ Invalid input(s): POCBNP   ---------------------------------------------------------------------------------------------------------------  Urinalysis    Component Value Date/Time   COLORURINE YELLOW 07/07/2010 1716   APPEARANCEUR CLEAR 07/07/2010 1716   LABSPEC 1.014 07/07/2010 1716   PHURINE 7.0 07/07/2010 1716   GLUCOSEU NEGATIVE 07/07/2010 1716   HGBUR SMALL* 07/07/2010 1716   BILIRUBINUR NEGATIVE 07/07/2010 1716   KETONESUR NEGATIVE 07/07/2010 1716   PROTEINUR 100* 07/07/2010 1716   UROBILINOGEN 0.2 07/07/2010 1716   NITRITE NEGATIVE 07/07/2010 1716   LEUKOCYTESUR NEGATIVE 07/07/2010 1716    ----------------------------------------------------------------------------------------------------------------   Imaging Results:    No results found.      Assessment & Plan:     1. Left arm AV fistula abscess. Will be admitted to a telemetry bed, blood cultures, placed on IV vancomycin and Zosyn, vascular surgery has already seen and scheduled the patient for surgery tomorrow morning. Since INR is 2.67 he will get low-dose vitamin K along with FFP. Her PT/INR in the evening and again in the morning.   2.  ESRD. Last dialysis was yesterday. Renal will be informed of patient's presence. He might require temporary dialysis access catheter due to #1 above.   3. DM type II. He takes Lantus 70  units daily at bedtime along with NovoLog 35 units with each meal, since he'll be nothing by mouth after midnight will reduce his Lantus to 60 units and his NovoLog to 25 units with each meal, NovoLog to be held if he is nothing by mouth, will do CBGs every 2 hours from tomorrow morning 6 AM while he is nothing by mouth to avoid any hypoglycemia.  4.History of DVT. He says he had bilateral lower extremity DVT few  years ago. For now Coumadin will be reversed per #1 above, once his INR is below 2 we will place him on a heparin drip to be dosed by pharmacy. Skin be held during perioperative period .  5.morbid obesity with obstructive sleep apnea. Does not wear C Pap. PCP follow-up for weight loss, oxygen while he is here in bed.   6. Essential hypertension. Continue home dose Norvasc, as needed IV hydralazine and Lopressor.   7. Dyslipidemia. Continue home dose statin.   8. Gout. Continue    DVT Prophylaxis Coumadin-Hep gtt - SCDs    AM Labs Ordered, also please review Full Orders  Family Communication: Admission, patients condition and plan of care including tests being ordered have been discussed with the patient and wife who indicate understanding and agree with the plan and Code Status.  Code Status Full  Likely DC to  Home 2-3 days  Condition GUARDED     Time spent in minutes : 35    SINGH,PRASHANT K M.D on 06/17/2015 at 3:34 PM  Between 7am to 7pm - Pager - [phone]  After 7pm go to [email or web address] - password [facility]  [facility] - Office  [phone]

## 2015-06-17 NOTE — ED Notes (Signed)
MD at bedside. 

## 2015-06-17 NOTE — Consult Note (Signed)
ANTIBIOTIC CONSULT NOTE - INITIAL  Pharmacy Consult for Vancomycin/Zosyn and Heparin once INR < 2 Indication: infected left arm AVF and hx DVT  Allergies  Allergen Reactions  . Aspirin Other (See Comments)    Stomach problems   Vital Signs: Temp: 98.8 F (37.1 C) (09/12 1225) Temp Source: Oral (09/12 1225) BP: 154/109 mmHg (09/12 1545) Pulse Rate: 102 (09/12 1515) Intake/Output from previous day:   Intake/Output from this shift:    Labs:  Recent Labs  06/17/15 1330  WBC 10.5  HGB 11.9*  PLT 317  CREATININE 8.12*   CrCl cannot be calculated (Unknown ideal weight.).  Microbiology: Recent Results (from the past 720 hour(s))  Culture, blood (routine x 2)     Status: None (Preliminary result)   Collection Time: 06/17/15  1:30 PM  Result Value Ref Range Status   Specimen Description BLOOD RIGHT HAND  Final   Special Requests BOTTLES DRAWN AEROBIC AND ANAEROBIC <MEASUREMENExcursion InNatashWesley Woodlawn [phone] Products FaSouthwell Medical, A Campus Of Trmc Bad NatashCentura Health-St Francis [facility] [phone] ProductUnion County General Hospital NevNatashEye Surgery Center Of Tul([phone] Products ChSouth Arkansas Surgery Center LakemNatashThe Ambulatory Surgery Center Of [phone] Products [facility] FNatashAlvarado Eye Surgery Center [phone] Products MoMolokai General Hospital Watts MiNatashThree Rivers Hospit571978-689Air Products BrVa Southern Nevada Healthcare System LawNatashMclaren Port [phone] 374Air Products [facility]aven MaypeNatashSurgery Center [phone] ProductsTri Parish Rehabilitation Hospital YorktNatashCollege Medical Center Hawthorne [phone] Products ElohiBaystate Medical Center PresNatashKaiser Fnd Hosp - San [phone] Products BingSkypark Surgery Center LLC AnderNatashSycamore [phone] Products MurrayNortheast Endoscopy Center Mesa ViNatashUpmc [phone] Products [facility] LavNatashSentara Williamsburg [facility] Medical [phone] Products BentleMethodist West Hospital BryantNatashInova [facility] Hospit8848 [phone] Products [facility] PittsbuNatashGeisinger-Bloomsburg [phone] Products BarTennova Healthcare - Harton PieNatashCarepoint Health-[facility]([phone] Products [facility] WaNatashAdventist Bolingbrook [phone] Products OakFranciscan St Margaret Health - Hammond BlennerhassNatashVa Black Hills Healthcare System - Hot [phone] Products BrowLawrence County Memorial Hospital CavNatashEncompass Health Rehabilitation Hospital The Woodlan([phone] Products MaideGrand Street Gastro[facility]ology Inc DeerfiNatashSan Francisco Va Medical Cen([phone] Products [facility] ColNatashPreston Memorial [phone] Products BeLargo Medical Center - Indian Rocks OsaNatashCentury Hospital Medical [phone] Products LittleAvera Saint Benedict Health Center ManNatashTexas Health Harris Methodist Hospital Stephenvil([phone] Products WoBaptist Health Madisonvilleodburyemicalsuffure PENDING  Hamilton   Report Status PENDING  Hamilton    Medical History: Past Medical History  Diagnosis Date  . Type 2 diabetes mellitus, uncontrolled        . Morbid obesity   . OSA (obstructive sleep apnea)   . Septic shock(785.52)   . Anemia   . Hypertension   . Seizures   . Gout   . ESRD (end stage renal disease)   . LOC (loss of consciousness)   . DVT (deep venous thrombosis) 2011  . Cardiomegaly   . Chronic anticoagulation   . Chronic pancreatitis   . Secondary hyperparathyroidism    Assessment: 49yom on vancomycin/ceftazidime pta for infected left arm AVF. Antibiotics to continue here but will change ceftazidime to zosyn as Dr. Singh wants to increase anaerobic coverage. He is ERSD on HD Mon, Tues, Thurs, Sat (confirmed with patient 4x/week) and his last dialysis session was today.  PTA doses: Vanc 1250mg  - last given 9/12 Ceftaz 2g - last given 9/12  9/12 graft abscess>> 9/12 blood x2>>  He is also on coumadin pta for hx DVT. INR on admit 2.4 but coumadin to be  held and INR to be reversed with 4 units FFP and 2mg  IV vitamin k pending graft removal in the OR once INR < 1.7. He will be bridged with a heparin drip once INR < 2.  Goal of Therapy:  pre HD vancomycin level 15-25  Heparin level 0.3-0.7  Plan:  - Zosyn 2.25g IV q8 - Doesn't need anymore vancomycin today, follow up HD schedule while here and enter vancomycin doses as needed - Check 2000 INR, begin heparin if < 2 - to OR for graft removal once INR < 1.7  Shane Hamilton 06/17/2015,3:56 PM

## 2015-06-17 NOTE — ED Notes (Signed)
[name] x4 ,NAD, VSS, all belongings sent with pt in bag.

## 2015-06-17 NOTE — ED Notes (Signed)
Ordered pt dinner tray.

## 2015-06-17 NOTE — ED Notes (Signed)
Pt refusing to leave O2 sensor on at this time, will monitor.

## 2015-06-18 ENCOUNTER — Encounter (HOSPITAL_COMMUNITY)
Admission: EM | Discharge status: Against Medical Advice | Payer: Self-pay | Source: Home / Self Care | Attending: Internal Medicine

## 2015-06-18 ENCOUNTER — Encounter: Payer: Self-pay | Admitting: Emergency Medicine

## 2015-06-18 ENCOUNTER — Inpatient Hospital Stay
Admission: EM | Admit: 2015-06-18 | Discharge: 2015-06-26 | DRG: 252 | Disposition: A | Discharge status: Home Health | Payer: Medicare Other | Attending: Internal Medicine | Admitting: Internal Medicine

## 2015-06-18 ENCOUNTER — Encounter (HOSPITAL_COMMUNITY): Payer: Self-pay | Admitting: Certified Registered Nurse Anesthetist

## 2015-06-18 DIAGNOSIS — Z79899 Other long term (current) drug therapy: Secondary | ICD-10-CM

## 2015-06-18 DIAGNOSIS — G4733 Obstructive sleep apnea (adult) (pediatric): Secondary | ICD-10-CM | POA: Diagnosis present

## 2015-06-18 DIAGNOSIS — E876 Hypokalemia: Secondary | ICD-10-CM | POA: Diagnosis present

## 2015-06-18 DIAGNOSIS — Z7901 Long term (current) use of anticoagulants: Secondary | ICD-10-CM

## 2015-06-18 DIAGNOSIS — Z86718 Personal history of other venous thrombosis and embolism: Secondary | ICD-10-CM | POA: Diagnosis not present

## 2015-06-18 DIAGNOSIS — Z4682 Encounter for fitting and adjustment of non-vascular catheter: Secondary | ICD-10-CM | POA: Diagnosis not present

## 2015-06-18 DIAGNOSIS — N2581 Secondary hyperparathyroidism of renal origin: Secondary | ICD-10-CM | POA: Diagnosis present

## 2015-06-18 DIAGNOSIS — I871 Compression of vein: Secondary | ICD-10-CM | POA: Diagnosis present

## 2015-06-18 DIAGNOSIS — J9690 Respiratory failure, unspecified, unspecified whether with hypoxia or hypercapnia: Secondary | ICD-10-CM

## 2015-06-18 DIAGNOSIS — Z833 Family history of diabetes mellitus: Secondary | ICD-10-CM

## 2015-06-18 DIAGNOSIS — I959 Hypotension, unspecified: Secondary | ICD-10-CM | POA: Diagnosis not present

## 2015-06-18 DIAGNOSIS — J811 Chronic pulmonary edema: Secondary | ICD-10-CM | POA: Diagnosis present

## 2015-06-18 DIAGNOSIS — T827XXA Infection and inflammatory reaction due to other cardiac and vascular devices, implants and grafts, initial encounter: Principal | ICD-10-CM

## 2015-06-18 DIAGNOSIS — E785 Hyperlipidemia, unspecified: Secondary | ICD-10-CM | POA: Diagnosis present

## 2015-06-18 DIAGNOSIS — R1310 Dysphagia, unspecified: Secondary | ICD-10-CM | POA: Diagnosis present

## 2015-06-18 DIAGNOSIS — T82858A Stenosis of vascular prosthetic devices, implants and grafts, initial encounter: Secondary | ICD-10-CM | POA: Diagnosis not present

## 2015-06-18 DIAGNOSIS — E119 Type 2 diabetes mellitus without complications: Secondary | ICD-10-CM | POA: Diagnosis not present

## 2015-06-18 DIAGNOSIS — E662 Morbid (severe) obesity with alveolar hypoventilation: Secondary | ICD-10-CM | POA: Diagnosis present

## 2015-06-18 DIAGNOSIS — J9621 Acute and chronic respiratory failure with hypoxia: Secondary | ICD-10-CM | POA: Diagnosis not present

## 2015-06-18 DIAGNOSIS — A4101 Sepsis due to Methicillin susceptible Staphylococcus aureus: Secondary | ICD-10-CM | POA: Diagnosis present

## 2015-06-18 DIAGNOSIS — M109 Gout, unspecified: Secondary | ICD-10-CM | POA: Diagnosis present

## 2015-06-18 DIAGNOSIS — B9561 Methicillin susceptible Staphylococcus aureus infection as the cause of diseases classified elsewhere: Secondary | ICD-10-CM | POA: Diagnosis present

## 2015-06-18 DIAGNOSIS — T827XXD Infection and inflammatory reaction due to other cardiac and vascular devices, implants and grafts, subsequent encounter: Secondary | ICD-10-CM | POA: Diagnosis not present

## 2015-06-18 DIAGNOSIS — G9340 Encephalopathy, unspecified: Secondary | ICD-10-CM | POA: Diagnosis not present

## 2015-06-18 DIAGNOSIS — I770 Arteriovenous fistula, acquired: Secondary | ICD-10-CM | POA: Diagnosis not present

## 2015-06-18 DIAGNOSIS — J9601 Acute respiratory failure with hypoxia: Secondary | ICD-10-CM | POA: Diagnosis not present

## 2015-06-18 DIAGNOSIS — Z8249 Family history of ischemic heart disease and other diseases of the circulatory system: Secondary | ICD-10-CM

## 2015-06-18 DIAGNOSIS — D649 Anemia, unspecified: Secondary | ICD-10-CM | POA: Diagnosis present

## 2015-06-18 DIAGNOSIS — J9811 Atelectasis: Secondary | ICD-10-CM | POA: Diagnosis not present

## 2015-06-18 DIAGNOSIS — Z794 Long term (current) use of insulin: Secondary | ICD-10-CM

## 2015-06-18 DIAGNOSIS — Z6837 Body mass index (BMI) 37.0-37.9, adult: Secondary | ICD-10-CM | POA: Diagnosis not present

## 2015-06-18 DIAGNOSIS — E1122 Type 2 diabetes mellitus with diabetic chronic kidney disease: Secondary | ICD-10-CM | POA: Diagnosis present

## 2015-06-18 DIAGNOSIS — Z452 Encounter for adjustment and management of vascular access device: Secondary | ICD-10-CM | POA: Diagnosis not present

## 2015-06-18 DIAGNOSIS — Y832 Surgical operation with anastomosis, bypass or graft as the cause of abnormal reaction of the patient, or of later complication, without mention of misadventure at the time of the procedure: Secondary | ICD-10-CM | POA: Diagnosis present

## 2015-06-18 DIAGNOSIS — I9589 Other hypotension: Secondary | ICD-10-CM | POA: Diagnosis not present

## 2015-06-18 DIAGNOSIS — J9602 Acute respiratory failure with hypercapnia: Secondary | ICD-10-CM | POA: Diagnosis not present

## 2015-06-18 DIAGNOSIS — T82510S Breakdown (mechanical) of surgically created arteriovenous fistula, sequela: Secondary | ICD-10-CM | POA: Diagnosis not present

## 2015-06-18 DIAGNOSIS — Z9981 Dependence on supplemental oxygen: Secondary | ICD-10-CM

## 2015-06-18 DIAGNOSIS — Z6841 Body Mass Index (BMI) 40.0 and over, adult: Secondary | ICD-10-CM | POA: Diagnosis not present

## 2015-06-18 DIAGNOSIS — Z992 Dependence on renal dialysis: Secondary | ICD-10-CM

## 2015-06-18 DIAGNOSIS — A419 Sepsis, unspecified organism: Secondary | ICD-10-CM | POA: Diagnosis not present

## 2015-06-18 DIAGNOSIS — I120 Hypertensive chronic kidney disease with stage 5 chronic kidney disease or end stage renal disease: Secondary | ICD-10-CM | POA: Diagnosis present

## 2015-06-18 DIAGNOSIS — I10 Essential (primary) hypertension: Secondary | ICD-10-CM | POA: Diagnosis not present

## 2015-06-18 DIAGNOSIS — R0600 Dyspnea, unspecified: Secondary | ICD-10-CM

## 2015-06-18 DIAGNOSIS — J9622 Acute and chronic respiratory failure with hypercapnia: Secondary | ICD-10-CM | POA: Diagnosis not present

## 2015-06-18 DIAGNOSIS — N186 End stage renal disease: Secondary | ICD-10-CM | POA: Diagnosis present

## 2015-06-18 DIAGNOSIS — D631 Anemia in chronic kidney disease: Secondary | ICD-10-CM | POA: Diagnosis not present

## 2015-06-18 DIAGNOSIS — I517 Cardiomegaly: Secondary | ICD-10-CM | POA: Diagnosis present

## 2015-06-18 DIAGNOSIS — R609 Edema, unspecified: Secondary | ICD-10-CM

## 2015-06-18 DIAGNOSIS — E872 Acidosis: Secondary | ICD-10-CM | POA: Diagnosis not present

## 2015-06-18 DIAGNOSIS — T827XXS Infection and inflammatory reaction due to other cardiac and vascular devices, implants and grafts, sequela: Secondary | ICD-10-CM | POA: Diagnosis not present

## 2015-06-18 DIAGNOSIS — R6520 Severe sepsis without septic shock: Secondary | ICD-10-CM | POA: Diagnosis present

## 2015-06-18 DIAGNOSIS — Z01818 Encounter for other preprocedural examination: Secondary | ICD-10-CM

## 2015-06-18 DIAGNOSIS — M7989 Other specified soft tissue disorders: Secondary | ICD-10-CM | POA: Diagnosis not present

## 2015-06-18 DIAGNOSIS — J9600 Acute respiratory failure, unspecified whether with hypoxia or hypercapnia: Secondary | ICD-10-CM | POA: Diagnosis not present

## 2015-06-18 DIAGNOSIS — Z4659 Encounter for fitting and adjustment of other gastrointestinal appliance and device: Secondary | ICD-10-CM

## 2015-06-18 LAB — CBC
HCT: 35.6 % — ABNORMAL LOW (ref 39.0–52.0)
Hemoglobin: 11.1 g/dL — ABNORMAL LOW (ref 13.0–17.0)
MCH: 33.3 pg (ref 26.0–34.0)
MCHC: 31.2 g/dL (ref 30.0–36.0)
MCV: 106.9 fL — ABNORMAL HIGH (ref 78.0–100.0)
PLATELETS: 292 10*3/uL (ref 150–400)
RBC: 3.33 MIL/uL — ABNORMAL LOW (ref 4.22–5.81)
RDW: 16.3 % — AB (ref 11.5–15.5)
WBC: 10.9 10*3/uL — AB (ref 4.0–10.5)

## 2015-06-18 LAB — PREPARE FRESH FROZEN PLASMA
UNIT DIVISION: 0
UNIT DIVISION: 0
Unit division: 0
Unit division: 0

## 2015-06-18 LAB — CBC WITH DIFFERENTIAL/PLATELET
BASOS ABS: 0.2 10*3/uL — AB (ref 0–0.1)
BASOS PCT: 2 %
Eosinophils Absolute: 0.3 10*3/uL (ref 0–0.7)
Eosinophils Relative: 3 %
HEMATOCRIT: 35.4 % — AB (ref 40.0–52.0)
HEMOGLOBIN: 11.2 g/dL — AB (ref 13.0–18.0)
LYMPHS PCT: 24 %
Lymphs Abs: 2.6 10*3/uL (ref 1.0–3.6)
MCH: 33.5 pg (ref 26.0–34.0)
MCHC: 31.8 g/dL — ABNORMAL LOW (ref 32.0–36.0)
MCV: 105.3 fL — AB (ref 80.0–100.0)
MONO ABS: 1 10*3/uL (ref 0.2–1.0)
Monocytes Relative: 9 %
NEUTROS ABS: 6.8 10*3/uL — AB (ref 1.4–6.5)
NEUTROS PCT: 62 %
Platelets: 307 10*3/uL (ref 150–440)
RBC: 3.36 MIL/uL — AB (ref 4.40–5.90)
RDW: 17.9 % — ABNORMAL HIGH (ref 11.5–14.5)
WBC: 10.8 10*3/uL — AB (ref 3.8–10.6)

## 2015-06-18 LAB — BASIC METABOLIC PANEL
ANION GAP: 16 — AB (ref 5–15)
Anion gap: 15 (ref 5–15)
BUN: 51 mg/dL — AB (ref 6–20)
BUN: 62 mg/dL — ABNORMAL HIGH (ref 6–20)
CO2: 27 mmol/L (ref 22–32)
CO2: 27 mmol/L (ref 22–32)
CREATININE: 10.68 mg/dL — AB (ref 0.61–1.24)
Calcium: 8.8 mg/dL — ABNORMAL LOW (ref 8.9–10.3)
Calcium: 8.9 mg/dL (ref 8.9–10.3)
Chloride: 93 mmol/L — ABNORMAL LOW (ref 101–111)
Chloride: 92 mmol/L — ABNORMAL LOW (ref 101–111)
Creatinine, Ser: 11.62 mg/dL — ABNORMAL HIGH (ref 0.61–1.24)
GFR calc Af Amer: 6 mL/min — ABNORMAL LOW (ref >60)
GFR, EST AFRICAN AMERICAN: 5 mL/min — AB (ref >60)
GFR, EST NON AFRICAN AMERICAN: 5 mL/min — AB (ref >60)
GFR, EST NON AFRICAN AMERICAN: 4 mL/min — AB (ref >60)
GLUCOSE: 184 mg/dL — AB (ref 65–99)
GLUCOSE: 160 mg/dL — AB (ref 65–99)
POTASSIUM: 3.1 mmol/L — AB (ref 3.5–5.1)
Potassium: 3.3 mmol/L — ABNORMAL LOW (ref 3.5–5.1)
SODIUM: 135 mmol/L (ref 135–145)
Sodium: 135 mmol/L (ref 135–145)

## 2015-06-18 LAB — HEMOGLOBIN A1C
Hgb A1c MFr Bld: 6.9 % — ABNORMAL HIGH (ref 4.8–5.6)
Mean Plasma Glucose: 151 mg/dL

## 2015-06-18 LAB — GLUCOSE, CAPILLARY
Glucose-Capillary: 155 mg/dL — ABNORMAL HIGH (ref 65–99)
Glucose-Capillary: 215 mg/dL — ABNORMAL HIGH (ref 65–99)

## 2015-06-18 LAB — PROTIME-INR
INR: 1.89 — AB (ref 0.00–1.49)
Prothrombin Time: 21.6 seconds — ABNORMAL HIGH (ref 11.6–15.2)

## 2015-06-18 SURGERY — REMOVAL OF ARTERIOVENOUS GORETEX GRAFT (AVGG)
Anesthesia: General

## 2015-06-18 MED ORDER — INSULIN ASPART PROT & ASPART (70-30 MIX) 100 UNIT/ML ~~LOC~~ SUSP
35.0000 [IU] | Freq: Three times a day (TID) | SUBCUTANEOUS | Status: DC
  Administered 2015-06-19 (×2): 35 [IU] via SUBCUTANEOUS
  Filled 2015-06-18 (×2): qty 35

## 2015-06-18 MED ORDER — PIPERACILLIN-TAZOBACTAM 3.375 G IVPB
3.3750 g | Freq: Two times a day (BID) | INTRAVENOUS | Status: DC
  Administered 2015-06-18 – 2015-06-19 (×2): 3.375 g via INTRAVENOUS
  Filled 2015-06-18 (×4): qty 50

## 2015-06-18 MED ORDER — PROPOFOL 10 MG/ML IV BOLUS
INTRAVENOUS | Status: AC
  Filled 2015-06-18: qty 20

## 2015-06-18 MED ORDER — INSULIN GLARGINE 100 UNIT/ML ~~LOC~~ SOLN
70.0000 [IU] | Freq: Every day | SUBCUTANEOUS | Status: DC
  Administered 2015-06-18 – 2015-06-20 (×2): 70 [IU] via SUBCUTANEOUS
  Filled 2015-06-18 (×3): qty 0.7

## 2015-06-18 MED ORDER — FENTANYL CITRATE (PF) 250 MCG/5ML IJ SOLN
INTRAMUSCULAR | Status: AC
  Filled 2015-06-18: qty 5

## 2015-06-18 MED ORDER — HYDROCODONE-ACETAMINOPHEN 5-325 MG PO TABS: 1.0000 | ORAL_TABLET | ORAL | Status: DC | PRN

## 2015-06-18 MED ORDER — COLCHICINE 0.6 MG PO TABS
0.6000 mg | ORAL_TABLET | Freq: Every day | ORAL | Status: DC
  Administered 2015-06-19: 0.6 mg via ORAL
  Filled 2015-06-18: qty 1

## 2015-06-18 MED ORDER — ALUM & MAG HYDROXIDE-SIMETH 200-200-20 MG/5ML PO SUSP
30.0000 mL | Freq: Four times a day (QID) | ORAL | Status: DC | PRN
Start: 2015-06-18 — End: 2015-06-20

## 2015-06-18 MED ORDER — MIDAZOLAM HCL 2 MG/2ML IJ SOLN
INTRAMUSCULAR | Status: AC
  Filled 2015-06-18: qty 4

## 2015-06-18 MED ORDER — ATORVASTATIN CALCIUM 10 MG PO TABS
10.0000 mg | ORAL_TABLET | Freq: Every day | ORAL | Status: DC
  Administered 2015-06-19 – 2015-06-20 (×2): 10 mg via ORAL
  Filled 2015-06-18 (×2): qty 1

## 2015-06-18 MED ORDER — CLONAZEPAM 0.5 MG PO TABS
1.0000 mg | ORAL_TABLET | Freq: Two times a day (BID) | ORAL | Status: DC
  Administered 2015-06-18 – 2015-06-19 (×2): 1 mg via ORAL
  Filled 2015-06-18 (×2): qty 1

## 2015-06-18 MED ORDER — ALLOPURINOL 300 MG PO TABS
300.0000 mg | ORAL_TABLET | Freq: Two times a day (BID) | ORAL | Status: DC
Start: 2015-06-18 — End: 2015-06-19
  Administered 2015-06-18 – 2015-06-19 (×2): 300 mg via ORAL
  Filled 2015-06-18 (×2): qty 1

## 2015-06-18 MED ORDER — LANTHANUM CARBONATE 500 MG PO CHEW
1000.0000 mg | CHEWABLE_TABLET | Freq: Two times a day (BID) | ORAL | Status: DC
  Administered 2015-06-19: 1000 mg via ORAL
  Filled 2015-06-18 (×3): qty 2

## 2015-06-18 MED ORDER — VANCOMYCIN HCL 10 G IV SOLR
2000.0000 mg | INTRAVENOUS | Status: AC
  Administered 2015-06-18: 2000 mg via INTRAVENOUS
  Filled 2015-06-18: qty 2000

## 2015-06-18 MED ORDER — OXYCODONE HCL 5 MG PO TABS: 10.0000 mg | ORAL_TABLET | Freq: Four times a day (QID) | ORAL | Status: DC | PRN

## 2015-06-18 MED ORDER — ONDANSETRON HCL 4 MG/2ML IJ SOLN
4.0000 mg | Freq: Four times a day (QID) | INTRAMUSCULAR | Status: DC | PRN
  Administered 2015-06-21: 4 mg via INTRAVENOUS
  Filled 2015-06-18: qty 2

## 2015-06-18 MED ORDER — ONDANSETRON HCL 4 MG/2ML IJ SOLN
INTRAMUSCULAR | Status: AC
  Filled 2015-06-18: qty 2

## 2015-06-18 MED ORDER — LIDOCAINE HCL (CARDIAC) 20 MG/ML IV SOLN
INTRAVENOUS | Status: AC
  Filled 2015-06-18: qty 5

## 2015-06-18 MED ORDER — ACETAMINOPHEN 325 MG PO TABS
650.0000 mg | ORAL_TABLET | Freq: Four times a day (QID) | ORAL | Status: DC | PRN
  Administered 2015-06-21: 650 mg via ORAL
  Filled 2015-06-18: qty 2

## 2015-06-18 MED ORDER — INSULIN ASPART 100 UNIT/ML ~~LOC~~ SOLN
0.0000 [IU] | Freq: Every day | SUBCUTANEOUS | Status: DC
  Administered 2015-06-18: 2 [IU] via SUBCUTANEOUS
  Filled 2015-06-18: qty 2

## 2015-06-18 MED ORDER — POTASSIUM CHLORIDE 20 MEQ/15ML (10%) PO SOLN
40.0000 meq | Freq: Once | ORAL | Status: DC
  Filled 2015-06-18: qty 30

## 2015-06-18 MED ORDER — ONDANSETRON HCL 4 MG PO TABS
4.0000 mg | ORAL_TABLET | Freq: Four times a day (QID) | ORAL | Status: DC | PRN
  Administered 2015-06-26: 4 mg via ORAL
  Filled 2015-06-18: qty 1

## 2015-06-18 MED ORDER — AMLODIPINE BESYLATE 5 MG PO TABS
5.0000 mg | ORAL_TABLET | Freq: Every day | ORAL | Status: DC
  Administered 2015-06-19: 5 mg via ORAL
  Filled 2015-06-18: qty 1

## 2015-06-18 MED ORDER — CINACALCET HCL 30 MG PO TABS
60.0000 mg | ORAL_TABLET | Freq: Every day | ORAL | Status: DC
  Administered 2015-06-19: 60 mg via ORAL
  Filled 2015-06-18 (×2): qty 2

## 2015-06-18 MED ORDER — ROCURONIUM BROMIDE 50 MG/5ML IV SOLN
INTRAVENOUS | Status: AC
  Filled 2015-06-18: qty 1

## 2015-06-18 MED ORDER — GABAPENTIN 300 MG PO CAPS
300.0000 mg | ORAL_CAPSULE | Freq: Three times a day (TID) | ORAL | Status: DC
  Administered 2015-06-18 – 2015-06-19 (×2): 300 mg via ORAL
  Filled 2015-06-18 (×3): qty 1

## 2015-06-18 MED ORDER — HEPARIN SODIUM (PORCINE) 5000 UNIT/ML IJ SOLN
5000.0000 [IU] | Freq: Two times a day (BID) | INTRAMUSCULAR | Status: DC
  Administered 2015-06-18 – 2015-06-20 (×4): 5000 [IU] via SUBCUTANEOUS
  Filled 2015-06-18 (×5): qty 1

## 2015-06-18 MED ORDER — ACETAMINOPHEN 650 MG RE SUPP: 650.0000 mg | Freq: Four times a day (QID) | RECTAL | Status: DC | PRN

## 2015-06-18 MED ORDER — ENOXAPARIN SODIUM 40 MG/0.4ML ~~LOC~~ SOLN: 40.0000 mg | SUBCUTANEOUS | Status: DC

## 2015-06-18 MED ORDER — VANCOMYCIN HCL IN DEXTROSE 1-5 GM/200ML-% IV SOLN
1000.0000 mg | INTRAVENOUS | Status: DC
  Administered 2015-06-19: 1000 mg via INTRAVENOUS
  Filled 2015-06-18 (×4): qty 200

## 2015-06-18 MED ORDER — PANTOPRAZOLE SODIUM 40 MG PO TBEC
40.0000 mg | DELAYED_RELEASE_TABLET | Freq: Every day | ORAL | Status: DC
  Administered 2015-06-19: 40 mg via ORAL
  Filled 2015-06-18: qty 1

## 2015-06-18 MED ORDER — RENA-VITE PO TABS
1.0000 | ORAL_TABLET | Freq: Every day | ORAL | Status: DC
  Administered 2015-06-19: 1 via ORAL
  Filled 2015-06-18: qty 1

## 2015-06-18 MED ORDER — SENNOSIDES-DOCUSATE SODIUM 8.6-50 MG PO TABS
1.0000 | ORAL_TABLET | Freq: Every evening | ORAL | Status: DC | PRN
  Filled 2015-06-18: qty 1

## 2015-06-18 MED ORDER — INSULIN ASPART 100 UNIT/ML ~~LOC~~ SOLN
0.0000 [IU] | Freq: Three times a day (TID) | SUBCUTANEOUS | Status: DC
  Administered 2015-06-19: 4 [IU] via SUBCUTANEOUS
  Filled 2015-06-18: qty 4

## 2015-06-18 MED ORDER — CALCIUM ACETATE 667 MG PO CAPS
2001.0000 mg | ORAL_CAPSULE | Freq: Three times a day (TID) | ORAL | Status: DC
  Administered 2015-06-19: 2001 mg via ORAL
  Filled 2015-06-18 (×8): qty 3

## 2015-06-18 NOTE — Anesthesia Preprocedure Evaluation (Signed)
Anesthesia Evaluation  Patient identified by MRN, date of birth, ID band Patient awake    Reviewed: Allergy & Precautions, NPO status , Patient's Chart, lab work & pertinent test results  Airway        Dental  (+) Dental Advisory Given   Pulmonary shortness of breath, with exertion and at rest, sleep apnea and Continuous Positive Airway Pressure Ventilation ,           Cardiovascular hypertension, Pt. on medications + DVT       Neuro/Psych Seizures -,     GI/Hepatic   Endo/Other  diabetes, Type 2, Insulin DependentMorbid obesity  Renal/GU ESRF and DialysisRenal disease     Musculoskeletal   Abdominal   Peds  Hematology   Anesthesia Other Findings   Reproductive/Obstetrics                             Anesthesia Physical Anesthesia Plan  ASA:   Anesthesia Plan: General   Post-op Pain Management:    Induction: Intravenous, Rapid sequence and Cricoid pressure planned  Airway Management Planned: Oral ETT and Video Laryngoscope Planned  Additional Equipment:   Intra-op Plan:   Post-operative Plan: Extubation in OR  Informed Consent: I have reviewed the patients History and Physical, chart, labs and discussed the procedure including the risks, benefits and alternatives for the proposed anesthesia with the patient or authorized representative who has indicated his/her understanding and acceptance.   Dental advisory given  Plan Discussed with: CRNA, Anesthesiologist and Surgeon  Anesthesia Plan Comments:         Anesthesia Quick Evaluation

## 2015-06-18 NOTE — Progress Notes (Signed)
RT attempted to obtain ABG twice and unable to obtain.  Pt has scar tissue from several  previous dialysis ports.  NP present and will call back if she would like another RT to try to obtain ABG.
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
## 2015-06-18 NOTE — ED Notes (Signed)
Iv started .  Pt waiting on admission now.

## 2015-06-18 NOTE — ED Notes (Signed)
Dr Eliseo Squires and Huel Cote in with pt again to discuss possible admission.

## 2015-06-18 NOTE — Progress Notes (Signed)
No new orders patient continues to refuses to wear O2 spouse at bedside patient aware of the dangers of low O2 SAT

## 2015-06-18 NOTE — Progress Notes (Signed)
Patient sats 75-85% on Room air patient refusing to wear O2.  [facility] on call paged

## 2015-06-18 NOTE — ED Notes (Signed)
.   Bloody fluid specimen sent to lab from left arm drawn by dr schneer.   Pt tolerated well.

## 2015-06-18 NOTE — Consult Note (Signed)
Pt still with INR 1.9 today.  Will give 2 more units of FFP now and recheck INR.  Fabienne Bruns, MD [facility] Office: [phone] Pager: [phone]

## 2015-06-18 NOTE — Progress Notes (Signed)
Shift event note:   Multiple notifications by RN regarding pt's refusal to wear 02 despite obvious lethargy and decreased sats (in the 70's and lower at times w/o 02).  ABG ordered. Went to bedside to speak w/ pt who is very resistant to encouragement to cooperate. "I been dealing w/ this for years so you can't tell me", as he speaks he is noted sitting on side of bed w/ eyes closed. Very lethargic. After leaving the room briefly and returning pt noted lying back across the bed w/ his wife holding his 02 mask on his face. Explained to pt we would be sending him to SDU because he needs to be monitored closer and needs to be where he can be assessed quickly in the event he becomes unconscious related to his refusal to wear 02. Now pt refuses to go to SDU. States he is claustrophobic and can not wear a mask. Pt threatens to leave AMA which he was told he has every right to do. AM pt/INR are 21.6/1.89. Has not received FFP d/t issues w/ his poor oxygenation and non-compliance.  Assessment/Plan: 1. Hypoxia: Pt is morbidly obese w/ OSA for which he will not wear CPAP at home. Refuses to wear [place] or 02 mask. Sats range from 60-70's to low 90's. Order placed for pt to transfer to SDU for closer monitoring. He may choose to leave AMA. He is supposed to have AV-graft removed today and replaced w/ temporary HD cath but unsure how this am's events will effect this plan. I have discussed pt w/ Dr Catha Gosselin who is in agreement w/ plan. Spoke w/ Boneta Lucks, RN on [phone] and ask that plans for transfer to SDU move forward. Will continue to monitor closely in SDU if pt does not decide to leave AMA.  Leanne Chang, NP-C [facility] Pager [phone]

## 2015-06-18 NOTE — ED Notes (Signed)
Pt has possible infection in left arm dialysis fistula. Pt able to ambulate to bed. Pt in no acute distress

## 2015-06-18 NOTE — Progress Notes (Signed)
Schorr up to see patient ABG ordered for patient Souse at bedside aware of the risk in not using O2

## 2015-06-18 NOTE — Significant Event (Signed)
Rapid Response Event Note  Overview: Time Called: 0458 Arrival Time: 0500 Event Type: Respiratory  Initial Focused Assessment: Respiratory difficulty   Interventions: Moderated stimulation and O2 therapy   Event Summary:  Called to assist with care of patient that was refusing to wear O2 with sats in the 40's. Patient was sitting up in the chair and very difficult to arouse. Respiration was shallow with periods of apnea. We were able to arouse him with moderate stimuli. Patient was advised to get back in bed with continue with O2 therapy. Patient became more alert and responsive after several minutes of verbal stimulation. On-call provider was made aware of the event.     at          [place]

## 2015-06-18 NOTE — Progress Notes (Signed)
ANTICOAGULATION CONSULT NOTE - Initial Consult  Pharmacy Consult for Lovenox/Heparin Indication: VTE prophylaxis  Allergies  Allergen Reactions  . Aspirin Other (See Comments)    Reaction:  GI upset     Patient Measurements: Height:  (180.3 cm) Weight: (!) 429 lb 14.4 oz (195 kg) IBW/kg (Calculated) : 75.3 Heparin Dosing Weight:   Vital Signs: Temp: 98.5 F (36.9 C) (09/13 1751) Temp Source: Oral (09/13 1313) BP: 154/99 mmHg (09/13 1844) Pulse Rate: 100 (09/13 1844)  Labs:  Recent Labs  06/17/15 1330 06/17/15 2035 06/18/15 0418 06/18/15 1420  HGB 11.9*  --  11.1* 11.2*  HCT 36.8*  --  35.6* 35.4*  PLT 317  --  292 307  LABPROT 25.5* 23.6* 21.6*  --   INR 2.36* 2.12* 1.89*  --   CREATININE 8.12*  --  10.68* 11.62*    Estimated Creatinine Clearance: 13.5 mL/min (by C-G formula based on Cr of 11.62).   Medical History: Past Medical History  Diagnosis Date  . Type 2 diabetes mellitus, uncontrolled        . Morbid obesity   . OSA (obstructive sleep apnea)   . Septic shock(785.52)   . Anemia   . Hypertension   . Seizures   . Gout   . ESRD (end stage renal disease)   . LOC (loss of consciousness)   . DVT (deep venous thrombosis) 2011  . Cardiomegaly   . Chronic anticoagulation   . Chronic pancreatitis   . Secondary hyperparathyroidism     Medications:  Prescriptions prior to admission  Medication Sig Dispense Refill Last Dose  . allopurinol (ZYLOPRIM) 300 MG tablet Take 300 mg by mouth 2 (two) times daily.   06/17/2015 at Unknown time  . amLODipine (NORVASC) 5 MG tablet Take 5 mg by mouth daily.   06/17/2015 at Unknown time  . atorvastatin (LIPITOR) 10 MG tablet Take 10 mg by mouth daily.   06/17/2015 at Unknown time  . calcium acetate (PHOSLO) 667 MG capsule Take 2,001 mg by mouth 3 (three) times daily with meals.    06/17/2015 at Unknown time  . cinacalcet (SENSIPAR) 60 MG tablet Take 60 mg by mouth daily.   06/17/2015 at Unknown time  . clonazePAM  (KLONOPIN) 1 MG tablet Take 1 mg by mouth 2 (two) times daily.   06/17/2015 at Unknown time  . colchicine 0.6 MG tablet Take 0.6 mg by mouth daily.   06/17/2015 at Unknown time  . gabapentin (NEURONTIN) 300 MG capsule Take 300 mg by mouth every 8 (eight) hours.   06/17/2015 at Unknown time  . hydrOXYzine (ATARAX/VISTARIL) 25 MG tablet Take 25 mg by mouth 3 (three) times daily as needed.   06/17/2015 at Unknown time  . insulin aspart protamine-insulin aspart (NOVOLOG 70/30) (70-30) 100 UNIT/ML injection Inject 35 Units into the skin 3 (three) times daily with meals.    06/17/2015 at Unknown time  . insulin glargine (LANTUS) 100 UNIT/ML injection Inject 70 Units into the skin at bedtime.    06/16/2015 at Unknown time  . lanthanum (FOSRENOL) 1000 MG chewable tablet Chew 1,000 mg by mouth 2 (two) times daily with a meal.   06/17/2015 at Unknown time  . multivitamin (RENA-VIT) TABS tablet Take 1 tablet by mouth daily.   3 06/16/2015 at Unknown time  . omeprazole (PRILOSEC) 20 MG capsule Take 20 mg by mouth 2 (two) times daily before a meal.    06/17/2015 at Unknown time  . Oxycodone HCl 10 MG TABS Take  10 mg by mouth every 6 (six) hours as needed (pain).    Past Week at Unknown time  . warfarin (COUMADIN) 7.5 MG tablet Take 7.5 mg by mouth See admin instructions. Pt takes 7.5mg  on Tuesday, Wednesday, Thursday, Saturday and Sunday and 10mg  on Monday and Friday   06/17/2015 at Unknown time    Assessment: CrCl = 13.5 ml/min  Goal of Therapy:  DVT prophylaxis   Plan:  Lovenox 40 mg SQ Q24H originally ordered.  Will d/c lovenox and start heparin 5000 units SQ Q12H based on CrCl < 15 m/min.   Brayln Duque D 06/18/2015,7:09 PM

## 2015-06-18 NOTE — Progress Notes (Signed)
Patient did not want to transfer to stepdown. He instead refused to transfer and decided he wanted to leave AMA. Dr. Catha Gosselin on floor and aware.

## 2015-06-18 NOTE — ED Notes (Signed)
Pt to ed with c/o ? Infection in dialysis fistula in left upper arm.  Pt states it has been swollen and irritated x 2 weeks.  Pt reports was seen by md today and told to come to ed for eval.  Pt also with noted low oxygen, sats vary between 85-87% on ra at triage.  Charge rn made aware of pt. Pt also noted to appear sleepy during triage.  Pt BP take in right lower leg.

## 2015-06-18 NOTE — Progress Notes (Signed)
Rapid response nurse west call to come look at patient will repage on call traid

## 2015-06-18 NOTE — Discharge Summary (Signed)
Physician Discharge Summary  Shane Hamilton ZOX:[id number] DOB: 28-May-1966 DOA: 06/17/2015  PCP: Darrow Bussing, MD  Admit date: 06/17/2015 Discharge date: 06/18/2015  Time spent: 10 minutes  Recommendations for Outpatient Follow-up:  Left AGAINST MEDICAL ADVICE   Discharge Diagnoses:  Principal Problem:   Abscess of arm Active Problems:   Obstructive sleep apnea   ESRD (end stage renal disease)   DVT (deep venous thrombosis)   Chronic anticoagulation   Type 2 diabetes mellitus, uncontrolled   Morbid obesity   Seizures   Abscess of right arm   Filed Weights   06/17/15 2100 06/18/15 0500  Weight: 195 kg (429 lb 14.4 oz) 196 kg (432 lb 1.6 oz)    History of present illness:  On 06/17/2015 by Dr. Eli Phillips Shane Hamilton is a 50 y.o. male, with history of morbid obesity, obstructive sleep apnea does not wear C Pap, ESRD undergoes dialysis under the care of Dr. Rich Reining, anemia of chronic disease, seizures, essential hypertension, DVT 5 years ago in bilateral lower extremities, type 2 diabetes mellitus, chronic anticoagulation with Coumadin whose been having issues with his left arm fistula with signs of infection for the last 7-10 days, he's been on IV anti-Bartek's during dialysis outpatient, he does not recall the anti-biotic name but says he gets 2 bags with everyone. After pharmacy review it was found that patient was getting vancomycin and ceftaz.   He was sent to the ER to be evaluated by vascular surgery for possible left arm fistula site abscess, in the ER he was seen by Dr. Dorothey Baseman who has scheduled him for a fistula ligation surgery tomorrow morning hospitalist team was requested to admit the patient since his INR was in therapeutic range.  Patient besides some left arm pain, swelling warmth and tenderness does not have any subjective complaints, he says he's been feeling somewhat weak for the last 7-10 days but no fever or chills, denies any headache, no chest  pain cough and shortness of breath, no abdominal pain or discomfort, no diarrhea, no blood in stool, no focal weakness.  Hospital Course:  Patient left against medical advice prior to my encounter.  Patient was admitted for arm AV fistula abscess. Vascular surgery was consulted. He was placed on IV vancomycin and Zosyn. Patient had an INR of 2.67, was receiving vitamin K along with fresh frozen plasma.   Patient also developed hypoxia. He has obstructive sleep apnea however refused to wear oxygen or see Pap or BiPAP.  Procedures:  None  Consultations:  Vascular surgery  Discharge Exam: Filed Vitals:   06/18/15 0358  BP: 100/47  Pulse: 109  Temp: 98.4 F (36.9 C)  Resp:    Did not examine the patient.  Left AMA prior to my encounter.  Discharge Instructions     Medication List    ASK your doctor about these medications        allopurinol 300 MG tablet  Commonly known as:  ZYLOPRIM  Take 300 mg by mouth 2 (two) times daily.     amLODipine 5 MG tablet  Commonly known as:  NORVASC  Take 5 mg by mouth daily.     atorvastatin 10 MG tablet  Commonly known as:  LIPITOR  Take 10 mg by mouth daily.     cinacalcet 60 MG tablet  Commonly known as:  SENSIPAR  Take 60 mg by mouth daily.     clonazePAM 1 MG tablet  Commonly known as:  KLONOPIN  Take 1 mg by mouth  2 (two) times daily.     colchicine 0.6 MG tablet  Take 0.6 mg by mouth daily.     gabapentin 300 MG capsule  Commonly known as:  NEURONTIN  Take 300 mg by mouth every 8 (eight) hours.     hydrOXYzine 25 MG tablet  Commonly known as:  ATARAX/VISTARIL  Take 25 mg by mouth 3 (three) times daily as needed.     insulin aspart protamine- aspart (70-30) 100 UNIT/ML injection  Commonly known as:  NOVOLOG MIX 70/30  Inject 35 Units into the skin 3 (three) times daily with meals.     insulin glargine 100 UNIT/ML injection  Commonly known as:  LANTUS  Inject 70 Units into the skin at bedtime.     lanthanum  1000 MG chewable tablet  Commonly known as:  FOSRENOL  Chew 1,000 mg by mouth 2 (two) times daily with a meal.     multivitamin Tabs tablet  Take 1 tablet by mouth daily.     omeprazole 20 MG capsule  Commonly known as:  PRILOSEC  Take 20 mg by mouth 2 (two) times daily before a meal.     Oxycodone HCl 10 MG Tabs  Take 10 mg by mouth every 6 (six) hours as needed (pain).     PHOSLO 667 MG capsule  Generic drug:  calcium acetate  Take 2,001 mg by mouth 3 (three) times daily with meals.     warfarin 7.5 MG tablet  Commonly known as:  COUMADIN  Take 7.5 mg by mouth See admin instructions. Pt takes 7.5mg  on Tuesday, Wednesday, Thursday, Saturday and Sunday and 10mg  on Monday and Friday       Allergies  Allergen Reactions  . Aspirin Other (See Comments)    Stomach problems      The results of significant diagnostics from this hospitalization (including imaging, microbiology, ancillary and laboratory) are listed below for reference.    Significant Diagnostic Studies: No results found.  Microbiology: Recent Results (from the past 240 hour(s))  Culture, routine-abscess     Status: None (Preliminary result)   Collection Time: 06/17/15  1:05 PM  Result Value Ref Range Status   Specimen Description ABSCESS LEFT ARM  Final   Special Requests NONE  Final   Gram Stain   Final    FEW WBC PRESENT,BOTH PMN AND MONONUCLEAR RARE SQUAMOUS EPITHELIAL CELLS PRESENT RARE GRAM POSITIVE COCCI IN PAIRS Performed at [facility]    Culture PENDING  Incomplete   Report Status PENDING  Incomplete  Culture, blood (routine x 2)     Status: None (Preliminary result)   Collection Time: 06/17/15  1:30 PM  Result Value Ref Range Status   Specimen Description BLOOD RIGHT HAND  Final   Special Requests BOTTLES DRAWN AEROBIC AND ANAEROBIC  Final   Culture PENDING  Incomplete   Report Status PENDING  Incomplete     Labs: Basic Metabolic Panel:  Recent Labs Lab 06/17/15 1330  06/18/15 0418  NA 136 135  K 3.0* 3.3*  CL 94* 93*  CO2 23 27  GLUCOSE 186* 184*  BUN 34* 51*  CREATININE 8.12* 10.68*  CALCIUM 8.8* 8.8*   Liver Function Tests: No results for input(s): AST, ALT, ALKPHOS, BILITOT, PROT, ALBUMIN in the last 168 hours. No results for input(s): LIPASE, AMYLASE in the last 168 hours. No results for input(s): AMMONIA in the last 168 hours. CBC:  Recent Labs Lab 06/17/15 1330 06/18/15 0418  WBC 10.5 10.9*  NEUTROABS  6.7  --   HGB 11.9* 11.1*  HCT 36.8* 35.6*  MCV 105.1* 106.9*  PLT 317 292   Cardiac Enzymes: No results for input(s): CKTOTAL, CKMB, CKMBINDEX, TROPONINI in the last 168 hours. BNP: BNP (last 3 results) No results for input(s): BNP in the last 8760 hours.  ProBNP (last 3 results) No results for input(s): PROBNP in the last 8760 hours.  CBG:  Recent Labs Lab 06/17/15 2038 06/18/15 0806  GLUCAP 249* 155*       Signed:  Edsel Petrin  [facility] 06/18/2015, 2:35 PM

## 2015-06-18 NOTE — Progress Notes (Signed)
ANTIBIOTIC CONSULT NOTE - INITIAL  Pharmacy Consult for Vancomycin/Zosyn Indication: fistula abscess  Allergies  Allergen Reactions  . Aspirin Other (See Comments)    Reaction:  GI upset     Patient Measurements: Height: 5\' 11"  (180.3 cm) Weight: (!) 429 lb 14.4 oz (195 kg) IBW/kg (Calculated) : 75.3 Adjusted Body Weight: 123 kg  Vital Signs: Temp: 98.5 F (36.9 C) (09/13 1751) Temp Source: Oral (09/13 1313) BP: 154/99 mmHg (09/13 1844) Pulse Rate: 100 (09/13 1844) Intake/Output from previous day:   Intake/Output from this shift:    Labs:  Recent Labs  06/17/15 1330 06/18/15 0418 06/18/15 1420  WBC 10.5 10.9* 10.8*  HGB 11.9* 11.1* 11.2*  PLT 317 292 307  CREATININE 8.12* 10.68* 11.62*   Estimated Creatinine Clearance: 13.5 mL/min (by C-G formula based on Cr of 11.62). No results for input(s): VANCOTROUGH, VANCOPEAK, VANCORANDOM, GENTTROUGH, GENTPEAK, GENTRANDOM, TOBRATROUGH, TOBRAPEAK, TOBRARND, AMIKACINPEAK, AMIKACINTROU, AMIKACIN in the last 72 hours.   Microbiology: Recent Results (from the past 720 hour(s))  Culture, routine-abscess     Status: None (Preliminary result)   Collection Time: 06/17/15  1:05 PM  Result Value Ref Range Status   Specimen Description ABSCESS LEFT ARM  Final   Special Requests NONE  Final   Gram Stain   Final    FEW WBC PRESENT,BOTH PMN AND MONONUCLEAR RARE SQUAMOUS EPITHELIAL CELLS PRESENT RARE GRAM POSITIVE COCCI IN PAIRS Performed at [facility]    Culture PENDING  Incomplete   Report Status PENDING  Incomplete  Culture, blood (routine x 2)     Status: None (Preliminary result)   Collection Time: 06/17/15  1:20 PM  Result Value Ref Range Status   Specimen Description BLOOD RIGHT ARM  Final   Special Requests BOTTLES DRAWN AEROBIC AND ANAEROBIC  Final   Culture NO GROWTH 1 DAY  Final   Report Status PENDING  Incomplete  Culture, blood (routine x 2)     Status: None (Preliminary result)   Collection Time:  06/17/15  1:30 PM  Result Value Ref Range Status   Specimen Description BLOOD RIGHT HAND  Final   Special Requests BOTTLES DRAWN AEROBIC AND ANAEROBIC  Final   Culture NO GROWTH 1 DAY  Final   Report Status PENDING  Incomplete    Medical History: Past Medical History  Diagnosis Date  . Type 2 diabetes mellitus, uncontrolled        . Morbid obesity   . OSA (obstructive sleep apnea)   . Septic shock(785.52)   . Anemia   . Hypertension   . Seizures   . Gout   . ESRD (end stage renal disease)   . LOC (loss of consciousness)   . DVT (deep venous thrombosis) 2011  . Cardiomegaly   . Chronic anticoagulation   . Chronic pancreatitis   . Secondary hyperparathyroidism     Medications:  Prescriptions prior to admission  Medication Sig Dispense Refill Last Dose  . allopurinol (ZYLOPRIM) 300 MG tablet Take 300 mg by mouth 2 (two) times daily.   06/17/2015 at Unknown time  . amLODipine (NORVASC) 5 MG tablet Take 5 mg by mouth daily.   06/17/2015 at Unknown time  . atorvastatin (LIPITOR) 10 MG tablet Take 10 mg by mouth daily.   06/17/2015 at Unknown time  . calcium acetate (PHOSLO) 667 MG capsule Take 2,001 mg by mouth 3 (three) times daily with meals.    06/17/2015 at Unknown time  . cinacalcet (SENSIPAR) 60 MG tablet Take  60 mg by mouth daily.   06/17/2015 at Unknown time  . clonazePAM (KLONOPIN) 1 MG tablet Take 1 mg by mouth 2 (two) times daily.   06/17/2015 at Unknown time  . colchicine 0.6 MG tablet Take 0.6 mg by mouth daily.   06/17/2015 at Unknown time  . gabapentin (NEURONTIN) 300 MG capsule Take 300 mg by mouth every 8 (eight) hours.   06/17/2015 at Unknown time  . hydrOXYzine (ATARAX/VISTARIL) 25 MG tablet Take 25 mg by mouth 3 (three) times daily as needed.   06/17/2015 at Unknown time  . insulin aspart protamine-insulin aspart (NOVOLOG 70/30) (70-30) 100 UNIT/ML injection Inject 35 Units into the skin 3 (three) times daily with meals.    06/17/2015 at Unknown time  . insulin  glargine (LANTUS) 100 UNIT/ML injection Inject 70 Units into the skin at bedtime.    06/16/2015 at Unknown time  . lanthanum (FOSRENOL) 1000 MG chewable tablet Chew 1,000 mg by mouth 2 (two) times daily with a meal.   06/17/2015 at Unknown time  . multivitamin (RENA-VIT) TABS tablet Take 1 tablet by mouth daily.   3 06/16/2015 at Unknown time  . omeprazole (PRILOSEC) 20 MG capsule Take 20 mg by mouth 2 (two) times daily before a meal.    06/17/2015 at Unknown time  . Oxycodone HCl 10 MG TABS Take 10 mg by mouth every 6 (six) hours as needed (pain).    Past Week at Unknown time  . warfarin (COUMADIN) 7.5 MG tablet Take 7.5 mg by mouth See admin instructions. Pt takes 7.5mg  on Tuesday, Wednesday, Thursday, Saturday and Sunday and  on Monday and Friday   06/17/2015 at Unknown time   Assessment: Pt on HD at home.  Last HD session was Mon, 9/12.  Will assume pt is on MWF schedule but probably need to confirm.  Goal of Therapy:  Vanc trough :  15 - 25 mcg/mL  Plan:  Expected duration 7 days with resolution of temperature and/or normalization of WBC   Will start Zosyn 3.375 gm IV Q12H EI .  Vancomycin 2 gm IV X 1 ordered for 9/13 as loading dose. Vancomycin 1 gm IV Q MWF ordered to be given during last 60 minutes of each HD session.  Will draw 1st Vanc trough on 9/19 30 minutes before 3rd HD session.   Ezequiel Macauley D 06/18/2015,8:01 PM

## 2015-06-18 NOTE — Care Management Note (Signed)
Case Management Note  Patient Details  Name: Shane Hamilton MRN: [id number] Date of Birth: [birth year]  Subjective/Objective:     Patient left AMA.               Action/Plan:   Expected Discharge Date:                  Expected Discharge Plan:  Against Medical Advice  In-House Referral:     Discharge planning Services     Post Acute Care Choice:    Choice offered to:     DME Arranged:    DME Agency:     HH Arranged:    HH Agency:     Status of Service:  Completed, signed off  Medicare Important Message Given:    Date Medicare IM Given:    Medicare IM give by:    Date Additional Medicare IM Given:    Additional Medicare Important Message give by:     If discussed at Long Length of Stay Meetings, dates discussed:    Additional Comments:  Leone Haven, RN 06/18/2015, 12:58 PM

## 2015-06-18 NOTE — H&P (Signed)
[facility] [place] at [facility]   PATIENT NAME: Shane Hamilton    MR#:  [id number]  DATE OF BIRTH:  July 08, 1966  DATE OF ADMISSION:  06/18/2015  PRIMARY CARE PHYSICIAN: Darrow Bussing, MD   REQUESTING/REFERRING PHYSICIAN: Dr. Huel Cote  CHIEF COMPLAINT:  AV fistula abscess HISTORY OF PRESENT ILLNESS:  Shane Hamilton  is a 49 y.o. male with a known history of end-stage renal disease on hemodialysis, OSA and type 2 diabetes who presents with above complaint. Patient was admitted yesterday to [facility] for AV fistula abscess he subsequently left AGAINST MEDICAL ADVICE stating that he preferred to come to [facility] for evaluation since his vascular surgeon is at this hospital. Patient reports that he has had issues with his left arm fistula for the past week. Apparently he has been getting IV vancomycin and Ceptaz edema during dialysis.  He presents today with AV fistula abscess. He has been seen by our vascular surgeon who is asking a hospitalist to admit the patient. Patient will receive temporary catheter for dialysis and will likely need a fistula ligation plan for either tonight or in the a.m.  PAST MEDICAL HISTORY:   Past Medical History  Diagnosis Date  . Type 2 diabetes mellitus, uncontrolled        . Morbid obesity   . OSA (obstructive sleep apnea)   . Septic shock(785.52)   . Anemia   . Hypertension   . Seizures   . Gout   . ESRD (end stage renal disease)   . LOC (loss of consciousness)   . DVT (deep venous thrombosis) 2011  . Cardiomegaly   . Chronic anticoagulation   . Chronic pancreatitis   . Secondary hyperparathyroidism     PAST SURGICAL HISTORY:   Past Surgical History  Procedure Laterality Date  . Av fistula placement    . Amputation finger / thumb Right     SOCIAL HISTORY:   Social History  Substance Use Topics  . Smoking status: Never Smoker   . Smokeless tobacco: Never Used  . Alcohol Use: Yes     Comment: Occasional  wine use    FAMILY HISTORY:   Family History  Problem Relation Age of Onset  . Diabetes Mother   . Hypertension Mother   . Diabetes Father   . Hypertension Father   . Hypertension Brother     DRUG ALLERGIES:   Allergies  Allergen Reactions  . Aspirin Other (See Comments)    Reaction:  GI upset      REVIEW OF SYSTEMS:  CONSTITUTIONAL: No fever, fatigue  positive analyzed weakness.  EYES: No blurred or double vision.  EARS, NOSE, AND THROAT: No tinnitus or ear pain.  RESPIRATORY: No cough, shortness of breath, wheezing or hemoptysis.  CARDIOVASCULAR: No chest pain, orthopnea, edema.  GASTROINTESTINAL: No nausea, vomiting, diarrhea or abdominal pain.  GENITOURINARY: No dysuria, hematuria.  ENDOCRINE: No polyuria, nocturia,  HEMATOLOGY: No anemia, easy bruising or bleeding SKIN: Left arm AV fistula with abscess swollen and tender  MUSCULOSKELETAL: No joint pain or arthritis.   NEUROLOGIC: No tingling, numbness, weakness.  PSYCHIATRY: Positive  anxiety /depression.   MEDICATIONS AT HOME:   Prior to Admission medications   Medication Sig Start Date End Date Taking? Authorizing Provider  allopurinol (ZYLOPRIM) 300 MG tablet Take 300 mg by mouth 2 (two) times daily.    Historical Provider, MD  amLODipine (NORVASC) 5 MG tablet Take 5 mg by mouth daily.    Historical Provider, MD  atorvastatin (  LIPITOR) 10 MG tablet Take 10 mg by mouth daily.    Historical Provider, MD  calcium acetate (PHOSLO) 667 MG capsule Take 2,001 mg by mouth 3 (three) times daily with meals.     Historical Provider, MD  cinacalcet (SENSIPAR) 60 MG tablet Take 60 mg by mouth daily.    Historical Provider, MD  clonazePAM (KLONOPIN) 1 MG tablet Take 1 mg by mouth 2 (two) times daily.    Historical Provider, MD  colchicine 0.6 MG tablet Take 0.6 mg by mouth daily.    Historical Provider, MD  gabapentin (NEURONTIN) 300 MG capsule Take 300 mg by mouth every 8 (eight) hours.    Historical Provider, MD   hydrOXYzine (ATARAX/VISTARIL) 25 MG tablet Take 25 mg by mouth 3 (three) times daily as needed.    Historical Provider, MD  insulin aspart protamine-insulin aspart (NOVOLOG 70/30) (70-30) 100 UNIT/ML injection Inject 35 Units into the skin 3 (three) times daily with meals.     Historical Provider, MD  insulin glargine (LANTUS) 100 UNIT/ML injection Inject 70 Units into the skin at bedtime.     Historical Provider, MD  lanthanum (FOSRENOL) 1000 MG chewable tablet Chew 1,000 mg by mouth 2 (two) times daily with a meal.    Historical Provider, MD  multivitamin (RENA-VIT) TABS tablet Take 1 tablet by mouth daily.  12/05/14   Historical Provider, MD  omeprazole (PRILOSEC) 20 MG capsule Take 20 mg by mouth 2 (two) times daily before a meal.     Historical Provider, MD  Oxycodone HCl 10 MG TABS Take 10 mg by mouth every 6 (six) hours as needed (pain).     Historical Provider, MD  warfarin (COUMADIN) 7.5 MG tablet Take 7.5 mg by mouth See admin instructions. Pt takes 7.5mg  on Tuesday, Wednesday, Thursday, Saturday and Sunday and 10mg  on Monday and Friday    Historical Provider, MD      VITAL SIGNS:  Blood pressure 100/62, pulse 100, temperature 97.8 F (36.6 C), temperature source Oral, resp. rate 20, height 5\' 11"  (1.803 m), weight 195 kg (429 lb 14.4 oz), SpO2 91 %.  PHYSICAL EXAMINATION:  GENERAL:  49 y.o.-year-old patient lying in the bed with no acute distress.  EYES: Pupils equal, round, reactive to light and accommodation. No scleral icterus. Extraocular muscles intact.  HEENT: Head atraumatic, normocephalic. Oropharynx and nasopharynx clear.  NECK:  Supple, no jugular venous distention. No thyroid enlargement, no tenderness.  LUNGS: Normal breath sounds bilaterally, no wheezing, rales,rhonchi or crepitation. No use of accessory muscles of respiration.  CARDIOVASCULAR: S1, S2 normal. No murmurs, rubs, or gallops.  ABDOMEN: Soft, nontender, nondistended. Bowel sounds present. No organomegaly or  mass.  EXTREMITIES: No pedal edema, cyanosis, or clubbing.  NEUROLOGIC: Cranial nerves II through XII are grossly intact. No focal deficits. PSYCHIATRIC: The patient is alert and oriented x 3.  SKIN: left arm AV fistula swollen tender  fluctuant  LABORATORY PANEL:   CBC  Recent Labs Lab 06/18/15 1420  WBC 10.8*  HGB 11.2*  HCT 35.4*  PLT 307   ------------------------------------------------------------------------------------------------------------------  Chemistries   Recent Labs Lab 06/18/15 1420  NA 135  K 3.1*  CL 92*  CO2 27  GLUCOSE 160*  BUN 62*  CREATININE 11.62*  CALCIUM 8.9   ------------------------------------------------------------------------------------------------------------------  Cardiac Enzymes No results for input(s): TROPONINI in the last 168 hours. ------------------------------------------------------------------------------------------------------------------  RADIOLOGY:  No results found.  EKG:    IMPRESSION AND PLAN:  49 year old male with end-stage renal disease on hemodialysis, type 2  diabetes history of DVT on anticoagulation who presents with left arm AV fistula abscess.  1. Left arm AV fistula abscess: Vision is admitted to the hospital service. Patient will be started on IV vancomycin and Zosyn. Patient has been seen and evaluated by vascular surgery as per ER physician. Plan is for surgery either this evening or tomorrow. Continue to follow up INR. It is 1.89 at this time.  2. End-stage renal disease on hemodialysis: Patient's last dialysis was on Monday. He will require to temporary dialysis catheter. I have consulted nephrology.  3. Hypokalemia: Potassium will be repleted gently. Repeat BMP in a.m.  4. Diabetes type 2: Patient will be placed on his outpatient medications and sliding scale insulin and ADA diet. Continue to monitor blood glucose levels.  5. History of DVT: Patient is currently on Coumadin which is on hold  due to problem #1. His DVTs were several years ago and therefore may not require Coumadin at discharge. I would ordered lower extremity Dopplers prior to discontinuing Coumadin.  6. Essential hypertension: Continue Norvasc.  7. Hyperlipidemia: Continue statin  8. Morbid obesity: Patient is encouraged to lose weight as tolerated.  9. Objective sleep apnea: Patient does not wear CPAP machine at night.    All the records are reviewed and case discussed with ED provider. Management plans discussed with the patient and he is in agreement.  CODE STATUS: FULL  TOTAL TIME TAKING CARE OF THIS PATIENT: 45 minutes.    Michaiah Holsopple M.D on 06/18/2015 at 5:37 PM  Between 7am to 6pm - Pager - [phone] After 6pm go to [email or web address] - password [email or web address] [facility]  [facility]  Office  [phone]  CC: Primary care physician; Darrow Bussing, MD

## 2015-06-18 NOTE — ED Notes (Signed)
Resumed care from [place] rn.  Pt alert.  Pt sitting in a chair.  Pt states he is ready to go home. md aware.  No acute distress.  Family at bedside.

## 2015-06-18 NOTE — ED Notes (Signed)
Dr Huel Cote in with pt.  Pt waiting on dr schneer.

## 2015-06-18 NOTE — ED Notes (Signed)
Dr Eliseo Squires in with pt now

## 2015-06-19 ENCOUNTER — Encounter: Payer: Self-pay | Admitting: Anesthesiology

## 2015-06-19 ENCOUNTER — Inpatient Hospital Stay: Payer: Medicare Other

## 2015-06-19 ENCOUNTER — Encounter
Admission: EM | Disposition: A | Discharge status: Home Health | Payer: Self-pay | Source: Home / Self Care | Attending: Internal Medicine

## 2015-06-19 DIAGNOSIS — J9622 Acute and chronic respiratory failure with hypercapnia: Secondary | ICD-10-CM

## 2015-06-19 DIAGNOSIS — T827XXA Infection and inflammatory reaction due to other cardiac and vascular devices, implants and grafts, initial encounter: Secondary | ICD-10-CM | POA: Insufficient documentation

## 2015-06-19 LAB — BLOOD GAS, ARTERIAL
ACID-BASE DEFICIT: 1.7 mmol/L (ref 0.0–2.0)
Acid-base deficit: 2.2 mmol/L — ABNORMAL HIGH (ref 0.0–2.0)
BICARBONATE: 26.5 meq/L (ref 21.0–28.0)
Bicarbonate: 28.6 mEq/L — ABNORMAL HIGH (ref 21.0–28.0)
DELIVERY SYSTEMS: POSITIVE
Expiratory PAP: 10
FIO2: 0.36
FIO2: 30
Inspiratory PAP: 14
O2 SAT: 67.3 %
O2 Saturation: 47.6 %
PCO2 ART: 84 mmHg — AB (ref 32.0–48.0)
PH ART: 7.14 — AB (ref 7.350–7.450)
PO2 ART: 35 mmHg — AB (ref 83.0–108.0)
Patient temperature: 37
Patient temperature: 37
pCO2 arterial: 59 mmHg — ABNORMAL HIGH (ref 32.0–48.0)
pH, Arterial: 7.26 — ABNORMAL LOW (ref 7.350–7.450)
pO2, Arterial: 41 mmHg — ABNORMAL LOW (ref 83.0–108.0)

## 2015-06-19 LAB — CBC
HCT: 36.9 % — ABNORMAL LOW (ref 40.0–52.0)
HEMOGLOBIN: 11.6 g/dL — AB (ref 13.0–18.0)
MCH: 33.7 pg (ref 26.0–34.0)
MCHC: 31.5 g/dL — ABNORMAL LOW (ref 32.0–36.0)
MCV: 106.8 fL — ABNORMAL HIGH (ref 80.0–100.0)
Platelets: 332 10*3/uL (ref 150–440)
RBC: 3.45 MIL/uL — AB (ref 4.40–5.90)
RDW: 17.9 % — ABNORMAL HIGH (ref 11.5–14.5)
WBC: 11 10*3/uL — ABNORMAL HIGH (ref 3.8–10.6)

## 2015-06-19 LAB — BASIC METABOLIC PANEL
ANION GAP: 17 — AB (ref 5–15)
BUN: 69 mg/dL — ABNORMAL HIGH (ref 6–20)
CALCIUM: 8.6 mg/dL — AB (ref 8.9–10.3)
CO2: 24 mmol/L (ref 22–32)
Chloride: 96 mmol/L — ABNORMAL LOW (ref 101–111)
Creatinine, Ser: 13.12 mg/dL — ABNORMAL HIGH (ref 0.61–1.24)
GFR, EST AFRICAN AMERICAN: 4 mL/min — AB (ref >60)
GFR, EST NON AFRICAN AMERICAN: 4 mL/min — AB (ref >60)
Glucose, Bld: 193 mg/dL — ABNORMAL HIGH (ref 65–99)
Potassium: 4.1 mmol/L (ref 3.5–5.1)
Sodium: 137 mmol/L (ref 135–145)

## 2015-06-19 LAB — PROTIME-INR
INR: 1.53
PROTHROMBIN TIME: 18.6 s — AB (ref 11.4–15.0)

## 2015-06-19 LAB — GLUCOSE, CAPILLARY
GLUCOSE-CAPILLARY: 155 mg/dL — AB (ref 65–99)
GLUCOSE-CAPILLARY: 59 mg/dL — AB (ref 65–99)
Glucose-Capillary: 105 mg/dL — ABNORMAL HIGH (ref 65–99)
Glucose-Capillary: 50 mg/dL — ABNORMAL LOW (ref 65–99)
Glucose-Capillary: 78 mg/dL (ref 65–99)
Glucose-Capillary: 93 mg/dL (ref 65–99)

## 2015-06-19 LAB — MRSA PCR SCREENING: MRSA BY PCR: NEGATIVE

## 2015-06-19 SURGERY — REVISON OF ARTERIOVENOUS FISTULA
Anesthesia: Choice | Laterality: Left

## 2015-06-19 MED ORDER — SUCCINYLCHOLINE CHLORIDE 20 MG/ML IJ SOLN
75.0000 mg | Freq: Once | INTRAMUSCULAR | Status: AC
Start: 2015-06-19 — End: 2015-06-19
  Administered 2015-06-19: 75 mg via INTRAVENOUS

## 2015-06-19 MED ORDER — CETYLPYRIDINIUM CHLORIDE 0.05 % MT LIQD
7.0000 mL | Freq: Two times a day (BID) | OROMUCOSAL | Status: DC
  Administered 2015-06-19: 7 mL via OROMUCOSAL

## 2015-06-19 MED ORDER — CEFAZOLIN SODIUM 1-5 GM-% IV SOLN
1.0000 g | INTRAVENOUS | Status: DC
  Administered 2015-06-19 – 2015-06-24 (×6): 1 g via INTRAVENOUS
  Filled 2015-06-19 (×11): qty 50

## 2015-06-19 MED ORDER — HEPARIN SODIUM (PORCINE) 5000 UNIT/ML IJ SOLN
INTRAMUSCULAR | Status: AC
  Filled 2015-06-19: qty 1

## 2015-06-19 MED ORDER — ETOMIDATE 2 MG/ML IV SOLN
20.0000 mg | Freq: Once | INTRAVENOUS | Status: AC
  Administered 2015-06-20: 20 mg via INTRAVENOUS

## 2015-06-19 MED ORDER — BUPIVACAINE HCL (PF) 0.5 % IJ SOLN
INTRAMUSCULAR | Status: AC
  Filled 2015-06-19: qty 30

## 2015-06-19 MED ORDER — PROPOFOL 10 MG/ML IV BOLUS
10.0000 mg | Freq: Once | INTRAVENOUS | Status: AC
  Administered 2015-06-20: 10 mg via INTRAVENOUS

## 2015-06-19 MED ORDER — DEXTROSE 50 % IV SOLN
INTRAVENOUS | Status: AC
  Administered 2015-06-19: 25 mL
  Filled 2015-06-19: qty 50

## 2015-06-19 MED ORDER — PANTOPRAZOLE SODIUM 40 MG IV SOLR
40.0000 mg | INTRAVENOUS | Status: DC
  Administered 2015-06-19: 40 mg via INTRAVENOUS
  Filled 2015-06-19: qty 40

## 2015-06-19 MED ORDER — INSULIN ASPART 100 UNIT/ML ~~LOC~~ SOLN
0.0000 [IU] | SUBCUTANEOUS | Status: DC
Start: 2015-06-19 — End: 2015-06-21
  Administered 2015-06-20 – 2015-06-21 (×4): 3 [IU] via SUBCUTANEOUS
  Filled 2015-06-19 (×4): qty 3

## 2015-06-19 MED ORDER — PROPOFOL 1000 MG/100ML IV EMUL
INTRAVENOUS | Status: AC
  Administered 2015-06-19: 5 ug/kg/min via INTRAVENOUS
  Filled 2015-06-19: qty 100

## 2015-06-19 MED ORDER — CHLORHEXIDINE GLUCONATE 0.12 % MT SOLN
15.0000 mL | Freq: Two times a day (BID) | OROMUCOSAL | Status: DC
  Administered 2015-06-19: 15 mL via OROMUCOSAL

## 2015-06-19 MED ORDER — MOMETASONE FURO-FORMOTEROL FUM 100-5 MCG/ACT IN AERO
2.0000 | INHALATION_SPRAY | Freq: Two times a day (BID) | RESPIRATORY_TRACT | Status: DC
Start: 2015-06-19 — End: 2015-06-20
  Filled 2015-06-19: qty 8.8

## 2015-06-19 MED ORDER — SUCCINYLCHOLINE CHLORIDE 20 MG/ML IJ SOLN
75.0000 mg | Freq: Once | INTRAMUSCULAR | Status: AC
  Administered 2015-06-19: 75 mg via INTRAVENOUS

## 2015-06-19 MED ORDER — PAPAVERINE HCL 30 MG/ML IJ SOLN
INTRAMUSCULAR | Status: AC
  Filled 2015-06-19: qty 2

## 2015-06-19 MED ORDER — PROPOFOL 1000 MG/100ML IV EMUL
0.0000 ug/kg/min | INTRAVENOUS | Status: DC
  Administered 2015-06-19: 5 ug/kg/min via INTRAVENOUS
  Administered 2015-06-20 (×2): 25 ug/kg/min via INTRAVENOUS
  Administered 2015-06-20: 20 ug/kg/min via INTRAVENOUS
  Administered 2015-06-20: 25 ug/kg/min via INTRAVENOUS
  Administered 2015-06-20: 20 ug/kg/min via INTRAVENOUS
  Administered 2015-06-20: 25 ug/kg/min via INTRAVENOUS
  Administered 2015-06-21: 35 ug/kg/min via INTRAVENOUS
  Administered 2015-06-21: 30 ug/kg/min via INTRAVENOUS
  Administered 2015-06-21: 40.034 ug/kg/min via INTRAVENOUS
  Filled 2015-06-19 (×9): qty 100

## 2015-06-19 MED ORDER — PROPOFOL 1000 MG/100ML IV EMUL
INTRAVENOUS | Status: AC
  Administered 2015-06-20: 10 mg via INTRAVENOUS
  Filled 2015-06-19: qty 100

## 2015-06-19 MED ORDER — PROPOFOL 1000 MG/100ML IV EMUL: 5.0000 ug/kg/min | INTRAVENOUS | Status: DC

## 2015-06-19 MED ORDER — ALLOPURINOL 300 MG PO TABS
300.0000 mg | ORAL_TABLET | Freq: Every day | ORAL | Status: DC
  Administered 2015-06-20: 300 mg via ORAL
  Filled 2015-06-19: qty 1

## 2015-06-19 SURGICAL SUPPLY — 50 items
APPLIER CLIP 11 MED OPEN (CLIP)
APPLIER CLIP 9.375 SM OPEN (CLIP)
BAG DECANTER FOR FLEXI CONT (MISCELLANEOUS) IMPLANT
BLADE SURG SZ11 CARB STEEL (BLADE) IMPLANT
BOOT SUTURE AID YELLOW STND (SUTURE) IMPLANT
BRUSH SCRUB 4% CHG (MISCELLANEOUS) IMPLANT
CANISTER SUCT 1200ML W/VALVE (MISCELLANEOUS) IMPLANT
CHLORAPREP W/TINT 26ML (MISCELLANEOUS) IMPLANT
CLIP APPLIE 11 MED OPEN (CLIP) IMPLANT
CLIP APPLIE 9.375 SM OPEN (CLIP) IMPLANT
ELECT CAUTERY BLADE 6.4 (BLADE) IMPLANT
GEL ULTRASOUND 20GR AQUASONIC (MISCELLANEOUS) IMPLANT
GLOVE SURG SYN 8.0 (GLOVE) IMPLANT
GOWN STRL REUS W/ TWL LRG LVL3 (GOWN DISPOSABLE) IMPLANT
GOWN STRL REUS W/ TWL XL LVL3 (GOWN DISPOSABLE) IMPLANT
GOWN STRL REUS W/TWL LRG LVL3 (GOWN DISPOSABLE)
GOWN STRL REUS W/TWL XL LVL3 (GOWN DISPOSABLE)
HEMOSTAT SURGICEL 2X3 (HEMOSTASIS) IMPLANT
IV NS 500ML (IV SOLUTION)
IV NS 500ML BAXH (IV SOLUTION) IMPLANT
KIT RM TURNOVER STRD PROC AR (KITS) IMPLANT
LABEL OR SOLS (LABEL) IMPLANT
LIQUID BAND (GAUZE/BANDAGES/DRESSINGS) IMPLANT
LOOP RED MAXI  1X406MM (MISCELLANEOUS)
LOOP VESSEL MAXI 1X406 RED (MISCELLANEOUS) IMPLANT
LOOP VESSEL MINI 0.8X406 BLUE (MISCELLANEOUS) IMPLANT
LOOPS BLUE MINI 0.8X406MM (MISCELLANEOUS)
NEEDLE FILTER BLUNT 18X 1/2SAF (NEEDLE)
NEEDLE FILTER BLUNT 18X1 1/2 (NEEDLE) IMPLANT
NEEDLE HYPO 30X.5 LL (NEEDLE) IMPLANT
NS IRRIG 500ML POUR BTL (IV SOLUTION) IMPLANT
PACK EXTREMITY ARMC (MISCELLANEOUS) IMPLANT
PAD GROUND ADULT SPLIT (MISCELLANEOUS) IMPLANT
PAD PREP 24X41 OB/GYN DISP (PERSONAL CARE ITEMS) IMPLANT
PUNCH SURGICAL ROTATE 2.7MM (MISCELLANEOUS) IMPLANT
STOCKINETTE STRL 4IN 9604848 (GAUZE/BANDAGES/DRESSINGS) IMPLANT
SUT MNCRL+ 5-0 UNDYED PC-3 (SUTURE) IMPLANT
SUT MONOCRYL 5-0 (SUTURE)
SUT PROLENE 6 0 BV (SUTURE) IMPLANT
SUT SILK 2 0 (SUTURE)
SUT SILK 2-0 18XBRD TIE 12 (SUTURE) IMPLANT
SUT SILK 3 0 (SUTURE)
SUT SILK 3-0 18XBRD TIE 12 (SUTURE) IMPLANT
SUT SILK 4 0 (SUTURE)
SUT SILK 4-0 18XBRD TIE 12 (SUTURE) IMPLANT
SUT VIC AB 3-0 SH 27 (SUTURE)
SUT VIC AB 3-0 SH 27X BRD (SUTURE) IMPLANT
SYR 20CC LL (SYRINGE) IMPLANT
SYR 3ML LL SCALE MARK (SYRINGE) IMPLANT
TOWEL OR 17X26 4PK STRL BLUE (TOWEL DISPOSABLE) IMPLANT

## 2015-06-19 NOTE — Consult Note (Signed)
Date: 06/19/2015                  Patient Name:  Shane Hamilton  MRN: [id number]  DOB: 12/05/1965  Age / Sex: 49 y.o., male         PCP: Darrow Bussing, MD                 Service Requesting Consult: Internal Medicine                 Reason for Consult: dialysis            History of Present Illness: Patient is a 49 y.o. male with medical problems of ESRD, Morbid obesity, OSA, Chronic anticoagulation for DVT, SHPTH , who was admitted to [facility] on 06/18/2015 for evaluation of decreased level of consciousness and infection of Left arm AVF.   Per chart, Pt has had redness and drainage over fistula for several days. He has also had some fever. He dialyzes at [employer]. He has had 6 Viabahn stents placed in [facility] July 2015 extending over the full course of the fistula.   He is currently somnolent, not able to provide any meaningful history He has hypercarbic resp failure and is requiring BiPAP 60 cc of pus was aspirated from his infected AVF site   Medications: Outpatient medications: Prescriptions prior to admission  Medication Sig Dispense Refill Last Dose  . allopurinol (ZYLOPRIM) 300 MG tablet Take 300 mg by mouth 2 (two) times daily.     . cinacalcet (SENSIPAR) 60 MG tablet Take 60 mg by mouth daily.   06/17/2015 at Unknown time  . clonazePAM (KLONOPIN) 1 MG tablet Take 1 mg by mouth 2 (two) times daily.   06/17/2015 at Unknown time  . colchicine 0.6 MG tablet Take 0.6 mg by mouth daily.   06/17/2015 at Unknown time  . gabapentin (NEURONTIN) 300 MG capsule Take 300 mg by mouth every 8 (eight) hours.   06/17/2015 at Unknown time  . hydrOXYzine (ATARAX/VISTARIL) 25 MG tablet Take 25 mg by mouth 3 (three) times daily as needed.   06/17/2015 at Unknown time  . insulin aspart protamine-insulin aspart (NOVOLOG 70/30) (70-30) 100 UNIT/ML injection Inject 35 Units into the skin 3 (three) times daily with meals.    06/17/2015 at Unknown time  . insulin glargine (LANTUS) 100 UNIT/ML  injection Inject 70 Units into the skin at bedtime.    06/16/2015 at Unknown time  . omeprazole (PRILOSEC) 20 MG capsule Take 20 mg by mouth 2 (two) times daily before a meal.    06/17/2015 at Unknown time  . Oxycodone HCl 10 MG TABS Take 10 mg by mouth every 6 (six) hours as needed (pain).    Past Week at Unknown time  . warfarin (COUMADIN) 7.5 MG tablet Take 7.5 mg by mouth See admin instructions. Pt takes 7.5mg  on Tuesday, Wednesday, Thursday, Saturday and Sunday and 10mg  on Monday and Friday   06/17/2015 at Unknown time  . amLODipine (NORVASC) 5 MG tablet Take 5 mg by mouth daily.   Not Taking at Unknown time  . atorvastatin (LIPITOR) 10 MG tablet Take 10 mg by mouth daily.   Not Taking at Unknown time  . calcium acetate (PHOSLO) 667 MG capsule Take 2,001 mg by mouth 3 (three) times daily with meals.    Not Taking at Unknown time  . lanthanum (FOSRENOL) 1000 MG chewable tablet Chew 1,000 mg by mouth 2 (two) times daily with a meal.   Not Taking  at Unknown time  . multivitamin (RENA-VIT) TABS tablet Take 1 tablet by mouth daily.   3 Not Taking at Unknown time    Current medications: Current Facility-Administered Medications  Medication Dose Route Frequency Provider Last Rate Last Dose  . acetaminophen (TYLENOL) tablet 650 mg  650 mg Oral Q6H PRN Adrian Saran, MD       Or  . acetaminophen (TYLENOL) suppository 650 mg  650 mg Rectal Q6H PRN Adrian Saran, MD      . Melene Muller ON 06/20/2015] allopurinol (ZYLOPRIM) tablet 300 mg  300 mg Oral Daily Enid Baas, MD      . alum & mag hydroxide-simeth (MAALOX/MYLANTA) 200-200-20 MG/5ML suspension 30 mL  30 mL Oral Q6H PRN Sital Mody, MD      . amLODipine (NORVASC) tablet 5 mg  5 mg Oral Daily Adrian Saran, MD   5 mg at 06/19/15 0944  . antiseptic oral rinse (CPC / CETYLPYRIDINIUM CHLORIDE 0.05%) solution 7 mL  7 mL Mouth Rinse q12n4p Enid Baas, MD      . atorvastatin (LIPITOR) tablet 10 mg  10 mg Oral Daily Adrian Saran, MD   10 mg at 06/19/15 0944  .  calcium acetate (PHOSLO) capsule 2,001 mg  2,001 mg Oral TID WC Adrian Saran, MD   2,001 mg at 06/19/15 0944  . ceFAZolin (ANCEF) IVPB 1 g/50 mL premix  1 g Intravenous Q24H Enid Baas, MD   1 g at 06/19/15 1534  . chlorhexidine (PERIDEX) 0.12 % solution 15 mL  15 mL Mouth Rinse BID Enid Baas, MD      . cinacalcet (SENSIPAR) tablet 60 mg  60 mg Oral Q breakfast Adrian Saran, MD   60 mg at 06/19/15 0943  . gabapentin (NEURONTIN) capsule 300 mg  300 mg Oral 3 times per day Adrian Saran, MD   Stopped at 06/19/15 1504  . heparin injection 5,000 Units  5,000 Units Subcutaneous Q12H Adrian Saran, MD   5,000 Units at 06/19/15 0943  . insulin aspart (novoLOG) injection 0-20 Units  0-20 Units Subcutaneous 6 times per day Merwyn Katos, MD   0 Units at 06/19/15 1424  . insulin aspart protamine- aspart (NOVOLOG MIX 70/30) injection 35 Units  35 Units Subcutaneous TID WC Adrian Saran, MD   35 Units at 06/19/15 1502  . insulin glargine (LANTUS) injection 70 Units  70 Units Subcutaneous QHS Adrian Saran, MD   70 Units at 06/18/15 2142  . lanthanum (FOSRENOL) chewable tablet 1,000 mg  1,000 mg Oral BID WC Adrian Saran, MD   1,000 mg at 06/19/15 0943  . mometasone-formoterol (DULERA) 100-5 MCG/ACT inhaler 2 puff  2 puff Inhalation BID Enid Baas, MD      . multivitamin (RENA-VIT) tablet 1 tablet  1 tablet Oral Daily Adrian Saran, MD   1 tablet at 06/19/15 0945  . ondansetron (ZOFRAN) tablet 4 mg  4 mg Oral Q6H PRN Adrian Saran, MD       Or  . ondansetron (ZOFRAN) injection 4 mg  4 mg Intravenous Q6H PRN Sital Mody, MD      . pantoprazole (PROTONIX) EC tablet 40 mg  40 mg Oral Daily Adrian Saran, MD   40 mg at 06/19/15 0943  . potassium chloride 20 MEQ/15ML (10%) solution 40 mEq  40 mEq Oral Once Adrian Saran, MD      . senna-docusate (Senokot-S) tablet 1 tablet  1 tablet Oral QHS PRN Adrian Saran, MD      . vancomycin (VANCOCIN) IVPB 1000 mg/200  mL premix  1,000 mg Intravenous Q M,W,F-HD Adrian Saran, MD   1,000 mg  at 06/19/15 1534      Allergies: Allergies  Allergen Reactions  . Aspirin Other (See Comments)    Reaction:  GI upset       Past Medical History: Past Medical History  Diagnosis Date  . Type 2 diabetes mellitus, uncontrolled        . Morbid obesity   . OSA (obstructive sleep apnea)   . Septic shock(785.52)   . Anemia   . Hypertension   . Seizures   . Gout   . ESRD (end stage renal disease)   . LOC (loss of consciousness)   . DVT (deep venous thrombosis) 2011  . Cardiomegaly   . Chronic anticoagulation   . Chronic pancreatitis   . Secondary hyperparathyroidism      Past Surgical History: Past Surgical History  Procedure Laterality Date  . Av fistula placement    . Amputation finger / thumb Right      Family History: Family History  Problem Relation Age of Onset  . Diabetes Mother   . Hypertension Mother   . Diabetes Father   . Hypertension Father   . Hypertension Brother      Social History: Social History   Social History  . Marital Status: Married    Spouse Name: N/A  . Number of Children: N/A  . Years of Education: N/A   Occupational History  . disabled     Social History Main Topics  . Smoking status: Never Smoker   . Smokeless tobacco: Never Used  . Alcohol Use: Yes     Comment: Occasional wine use  . Drug Use: No  . Sexual Activity: Not on file   Other Topics Concern  . Not on file   Social History Narrative     Review of Systems: not available because of patient's mental status   Vital Signs: Blood pressure 97/63, pulse 110, temperature 100.1 F (37.8 C), temperature source Axillary, resp. rate 18, height 5\' 9"  (1.753 m), weight 199 kg (438 lb 11.5 oz), SpO2 93 %.   Intake/Output Summary (Last 24 hours) at 06/19/15 1603 Last data filed at 06/19/15 1200  Gross per 24 hour  Intake    523 ml  Output      0 ml  Net    523 ml    Weight trends: Filed Weights   06/18/15 1313 06/19/15 1400  Weight: 195 kg (429 lb 14.4  oz) 199 kg (438 lb 11.5 oz)    Physical Exam: General:  critically ill appearing, morbidly obese  HEENT BiPAP mask in place  Neck:  supple  Lungs: Clear Ant, lat, NIPPV  Heart::  regular, no rub  Abdomen: Soft, obese, non tender  Extremities:  trace peripheral edema  Neurologic: somnolent  Skin: No acute rashes  Access: Infected AVF  Foley:        Lab results: Basic Metabolic Panel:  Recent Labs Lab 06/18/15 0418 06/18/15 1420 06/19/15 0622  NA 135 135 137  K 3.3* 3.1* 4.1  CL 93* 92* 96*  CO2 27 27 24   GLUCOSE 184* 160* 193*  BUN 51* 62* 69*  CREATININE 10.68* 11.62* 13.12*  CALCIUM 8.8* 8.9 8.6*    Liver Function Tests: No results for input(s): AST, ALT, ALKPHOS, BILITOT, PROT, ALBUMIN in the last 168 hours. No results for input(s): LIPASE, AMYLASE in the last 168 hours. No results for input(s): AMMONIA in the last 168 hours.  CBC:  Recent Labs Lab 06/17/15 1330  06/18/15 1420 06/19/15 0622  WBC 10.5  < > 10.8* 11.0*  NEUTROABS 6.7  --  6.8*  --   HGB 11.9*  < > 11.2* 11.6*  HCT 36.8*  < > 35.4* 36.9*  MCV 105.1*  < > 105.3* 106.8*  PLT 317  < > 307 332  < > = values in this interval not displayed.  Cardiac Enzymes: No results for input(s): CKTOTAL, TROPONINI in the last 168 hours.  BNP: Invalid input(s): POCBNP  CBG:  Recent Labs Lab 06/17/15 2038 06/18/15 0806 06/18/15 2129 06/19/15 0733 06/19/15 1256  GLUCAP 249* 155* 215* 155* 105*    Microbiology: Recent Results (from the past 720 hour(s))  Culture, routine-abscess     Status: None (Preliminary result)   Collection Time: 06/17/15  1:05 PM  Result Value Ref Range Status   Specimen Description ABSCESS LEFT ARM  Final   Special Requests NONE  Final   Gram Stain   Final    FEW WBC PRESENT,BOTH PMN AND MONONUCLEAR RARE SQUAMOUS EPITHELIAL CELLS PRESENT RARE GRAM POSITIVE COCCI IN PAIRS Performed at [facility]    Culture   Final    MODERATE STAPHYLOCOCCUS AUREUS Note:  RIFAMPIN AND GENTAMICIN SHOULD NOT BE USED AS SINGLE DRUGS FOR TREATMENT OF STAPH INFECTIONS. Performed at [facility]    Report Status PENDING  Incomplete  Culture, blood (routine x 2)     Status: None (Preliminary result)   Collection Time: 06/17/15  1:20 PM  Result Value Ref Range Status   Specimen Description BLOOD RIGHT ARM  Final   Special Requests BOTTLES DRAWN AEROBIC AND ANAEROBIC  Final   Culture NO GROWTH 1 DAY  Final   Report Status PENDING  Incomplete  Culture, blood (routine x 2)     Status: None (Preliminary result)   Collection Time: 06/17/15  1:30 PM  Result Value Ref Range Status   Specimen Description BLOOD RIGHT HAND  Final   Special Requests BOTTLES DRAWN AEROBIC AND ANAEROBIC  Final   Culture NO GROWTH 1 DAY  Final   Report Status PENDING  Incomplete  Body fluid culture     Status: None (Preliminary result)   Collection Time: 06/18/15  4:38 PM  Result Value Ref Range Status   Specimen Description ABSCESS  Final   Special Requests NONE  Final   Gram Stain PENDING  Incomplete   Culture   Final    MODERATE GROWTH STAPHYLOCOCCUS AUREUS SUSCEPTIBILITIES TO FOLLOW    Report Status PENDING  Incomplete     Coagulation Studies:  Recent Labs  06/17/15 2035 06/18/15 0418 06/19/15 0622  LABPROT 23.6* 21.6* 18.6*  INR 2.12* 1.89* 1.53    Urinalysis: No results for input(s): COLORURINE, LABSPEC, PHURINE, GLUCOSEU, HGBUR, BILIRUBINUR, KETONESUR, PROTEINUR, UROBILINOGEN, NITRITE, LEUKOCYTESUR in the last 72 hours.  Invalid input(s): APPERANCEUR    Imaging: US Venous Img Lower Bilateral  06/19/2015   CLINICAL DATA:  49 year old male with a history of swelling bilateral lower extremities  EXAM: BILATERAL LOWER EXTREMITY VENOUS DOPPLER ULTRASOUND  TECHNIQUE: Gray-scale sonography with graded compression, as well as color Doppler and duplex ultrasound were performed to evaluate the lower extremity deep venous systems from the level of the common  femoral vein and including the common femoral, femoral, profunda femoral, popliteal and calf veins including the posterior tibial, peroneal and gastrocnemius veins when visible. The superficial great saphenous vein was also interrogated. Spectral Doppler was utilized to evaluate  flow at rest and with distal augmentation maneuvers in the common femoral, femoral and popliteal veins.  COMPARISON:  None.  FINDINGS: RIGHT LOWER EXTREMITY  Common Femoral Vein: No evidence of thrombus. Normal compressibility, respiratory phasicity and response to augmentation.  Saphenofemoral Junction: No evidence of thrombus. Normal compressibility and flow on color Doppler imaging.  Profunda Femoral Vein: No evidence of thrombus. Normal compressibility and flow on color Doppler imaging.  Femoral Vein: No evidence of thrombus. Normal compressibility, respiratory phasicity and response to augmentation.  Popliteal Vein: No evidence of thrombus. Normal compressibility, respiratory phasicity and response to augmentation.  Calf Veins: Posterior tibial vein without occlusive thrombus. Peroneal veins not visualized.  Superficial Great Saphenous Vein: No evidence of thrombus. Normal compressibility and flow on color Doppler imaging.  Other Findings:  None.  LEFT LOWER EXTREMITY  Common Femoral Vein: No evidence of thrombus. Normal compressibility, respiratory phasicity and response to augmentation.  Saphenofemoral Junction: No evidence of thrombus. Normal compressibility and flow on color Doppler imaging.  Profunda Femoral Vein: No evidence of thrombus. Normal compressibility and flow on color Doppler imaging.  Femoral Vein: No evidence of thrombus. Normal compressibility, respiratory phasicity and response to augmentation.  Popliteal Vein: No evidence of thrombus. Normal compressibility, respiratory phasicity and response to augmentation.  Calf Veins: Posterior tibial vein without occlusive thrombus. Peroneal veins not well visualized.   Superficial Great Saphenous Vein: No evidence of thrombus. Normal compressibility and flow on color Doppler imaging.  Other Findings:  None.  IMPRESSION: Sonographic survey of the bilateral lower extremities negative for DVT.  Signed,  Yvone Neu. Loreta Ave, DO  [facility]  [facility]   Electronically Signed   By: Gilmer Mor D.O.   On: 06/19/2015 12:57      Assessment & Plan: Pt is a 49 y.o. yo male with a PMHX of ESRD, Morbid obesity, OSA, Chronic anticoagulation for DVT, SHPTH,  was admitted on 06/18/2015 with sepsis, acute resp failure.   1. ESRD. [facility] [place] [facility] 2. Sepsis from infected AVF. Staph aureus 3. AOCKD 4. SHPTH  Plan: ICU support for critical illness and sepsis NIPPV Broad spectrum ABx as per primary team Short course of dialysis treatment today to correct uremia D/c Kcl Minimize oral meds. Hold binders/vitamins etc.

## 2015-06-19 NOTE — Op Note (Signed)
  OPERATIVE NOTE   PROCEDURE: 1. Insertion of temporary dialysis catheter catheter right femoral approach.  PRE-OPERATIVE DIAGNOSIS:  complications dialysis device with infected left arm fistula; end-stage renal disease requiring hemodialysis  POST-OPERATIVE DIAGNOSIS: Same  SURGEON: Renford Dills M.D.  ANESTHESIA: 1% lidocaine local infiltration  ESTIMATED BLOOD LOSS: Minimal cc  INDICATIONS:   Shane Hamilton is a 49 y.o. male who presents with sepsis secondary to infected fistula. He requires hemodialysis and therefore is undergoing placement of a temporary catheter..  DESCRIPTION: After obtaining full informed written consent, the patient was positioned supine. The right thigh was prepped and draped in a sterile fashion. Ultrasound was placed in a sterile sleeve. Ultrasound was utilized to identify the right common femoral vein which is noted to be echolucent and compressible indicating patency. Images recorded for the permanent record. Under real-time visualization a micropuncture needle is inserted into the vein and the guidewires advanced without difficulty. Small counterincision was made at the wire insertion site. J-wire was then advanced without difficulty.  Dilator is passed over the wire and the temporary dialysis catheter catheter is fed over the wire without difficulty.  All lumens aspirate and flush easily and are packed with heparin saline. Catheter secured to the skin of the thigh with 2-0 silk. A sterile dressing is applied with Biopatch.  COMPLICATIONS: None  CONDITION: Critical  Renford Dills, M.D. [facility]. Office:  [phone]

## 2015-06-19 NOTE — Care Management Note (Signed)
Patient is [employer] MWF and S schedule.  I will meet with patient tomorrow to get specific clinic in [place].  Ivor Reining Dialysis Liaison  [phone]

## 2015-06-19 NOTE — Progress Notes (Signed)
Rapid response called; pt breathing was labored, pt lethargic and minimally responsive verbally; O2 sats checked and were found to be in 70%'s; respiratory therapy was present during this time to draw previously ordered ABG; Dr Nemiah Commander notified and came to bedside; pt transferred to CCU; RN Casimiro Needle accompanied pt upon transfer to CCU; report given at bedside to [facility], RN; 2C charge nurse Viviann Spare notified pt's wife of pt transfer

## 2015-06-19 NOTE — Progress Notes (Signed)
Notified Dr Nemiah Commander d/t pt extreme lethargy; notified Dr that pt wife stated "he usually isn't this hard to wake up"; Dr acknowledged, ordered stat ABG

## 2015-06-19 NOTE — Progress Notes (Signed)
Pt belongings brought to CCU from [facility] and given to  Engineering, RN

## 2015-06-19 NOTE — Progress Notes (Signed)
   06/19/15 1400  Clinical Encounter Type  Visited With Patient  Visit Type Initial  Referral From Nurse  Spiritual Encounters  Spiritual Needs Prayer;Emotional  Stress Factors  Patient Stress Factors Health changes  Family Stress Factors Health changes  Chaplain Colin Mulders was paged to room. Chaplain engaged patient, wife, and care team and offered prayer and support. On call Chaplain will follow up.

## 2015-06-19 NOTE — Progress Notes (Signed)
HD tx ended 

## 2015-06-19 NOTE — Progress Notes (Signed)
ANTIBIOTIC CONSULT NOTE - INITIAL  Pharmacy Consult for Cefazolin Indication: fistula abscess  Allergies  Allergen Reactions  . Aspirin Other (See Comments)    Reaction:  GI upset     Patient Measurements: Height: 5\' 11"  (180.3 cm) Weight: (!) 429 lb 14.4 oz (195 kg) IBW/kg (Calculated) : 75.3 Adjusted Body Weight: 123 kg  Vital Signs: BP: 136/61 mmHg (09/14 0542) Pulse Rate: 121 (09/14 0542) Intake/Output from previous day: 09/13 0701 - 09/14 0700 In: 523 [IV Piggyback:523] Out: -  Intake/Output from this shift:    Labs:  Recent Labs  06/18/15 0418 06/18/15 1420 06/19/15 0622  WBC 10.9* 10.8* 11.0*  HGB 11.1* 11.2* 11.6*  PLT 292 307 332  CREATININE 10.68* 11.62* 13.12*   Estimated Creatinine Clearance: 12 mL/min (by C-G formula based on Cr of 13.12). No results for input(s): VANCOTROUGH, VANCOPEAK, VANCORANDOM, GENTTROUGH, GENTPEAK, GENTRANDOM, TOBRATROUGH, TOBRAPEAK, TOBRARND, AMIKACINPEAK, AMIKACINTROU, AMIKACIN in the last 72 hours.   Microbiology: Recent Results (from the past 720 hour(s))  Culture, routine-abscess     Status: None (Preliminary result)   Collection Time: 06/17/15  1:05 PM  Result Value Ref Range Status   Specimen Description ABSCESS LEFT ARM  Final   Special Requests NONE  Final   Gram Stain   Final    FEW WBC PRESENT,BOTH PMN AND MONONUCLEAR RARE SQUAMOUS EPITHELIAL CELLS PRESENT RARE GRAM POSITIVE COCCI IN PAIRS Performed at [facility]    Culture   Final    MODERATE STAPHYLOCOCCUS AUREUS Note: RIFAMPIN AND GENTAMICIN SHOULD NOT BE USED AS SINGLE DRUGS FOR TREATMENT OF STAPH INFECTIONS. Performed at [facility]    Report Status PENDING  Incomplete  Culture, blood (routine x 2)     Status: None (Preliminary result)   Collection Time: 06/17/15  1:20 PM  Result Value Ref Range Status   Specimen Description BLOOD RIGHT ARM  Final   Special Requests BOTTLES DRAWN AEROBIC AND ANAEROBIC  Final   Culture NO  GROWTH 1 DAY  Final   Report Status PENDING  Incomplete  Culture, blood (routine x 2)     Status: None (Preliminary result)   Collection Time: 06/17/15  1:30 PM  Result Value Ref Range Status   Specimen Description BLOOD RIGHT HAND  Final   Special Requests BOTTLES DRAWN AEROBIC AND ANAEROBIC  Final   Culture NO GROWTH 1 DAY  Final   Report Status PENDING  Incomplete  Body fluid culture     Status: None (Preliminary result)   Collection Time: 06/18/15  4:38 PM  Result Value Ref Range Status   Specimen Description ABSCESS  Final   Special Requests NONE  Final   Gram Stain PENDING  Incomplete   Culture   Final    MODERATE GROWTH STAPHYLOCOCCUS AUREUS SUSCEPTIBILITIES TO FOLLOW    Report Status PENDING  Incomplete    Medical History: Past Medical History  Diagnosis Date  . Type 2 diabetes mellitus, uncontrolled        . Morbid obesity   . OSA (obstructive sleep apnea)   . Septic shock(785.52)   . Anemia   . Hypertension   . Seizures   . Gout   . ESRD (end stage renal disease)   . LOC (loss of consciousness)   . DVT (deep venous thrombosis) 2011  . Cardiomegaly   . Chronic anticoagulation   . Chronic pancreatitis   . Secondary hyperparathyroidism     Medications:  Prescriptions prior to admission  Medication Sig Dispense  Refill Last Dose  . allopurinol (ZYLOPRIM) 300 MG tablet Take 300 mg by mouth 2 (two) times daily.     . cinacalcet (SENSIPAR) 60 MG tablet Take 60 mg by mouth daily.   06/17/2015 at Unknown time  . clonazePAM (KLONOPIN) 1 MG tablet Take 1 mg by mouth 2 (two) times daily.   06/17/2015 at Unknown time  . colchicine 0.6 MG tablet Take 0.6 mg by mouth daily.   06/17/2015 at Unknown time  . gabapentin (NEURONTIN) 300 MG capsule Take 300 mg by mouth every 8 (eight) hours.   06/17/2015 at Unknown time  . hydrOXYzine (ATARAX/VISTARIL) 25 MG tablet Take 25 mg by mouth 3 (three) times daily as needed.   06/17/2015 at Unknown time  . insulin aspart  protamine-insulin aspart (NOVOLOG 70/30) (70-30) 100 UNIT/ML injection Inject 35 Units into the skin 3 (three) times daily with meals.    06/17/2015 at Unknown time  . insulin glargine (LANTUS) 100 UNIT/ML injection Inject 70 Units into the skin at bedtime.    06/16/2015 at Unknown time  . omeprazole (PRILOSEC) 20 MG capsule Take 20 mg by mouth 2 (two) times daily before a meal.    06/17/2015 at Unknown time  . Oxycodone HCl 10 MG TABS Take 10 mg by mouth every 6 (six) hours as needed (pain).    Past Week at Unknown time  . warfarin (COUMADIN) 7.5 MG tablet Take 7.5 mg by mouth See admin instructions. Pt takes 7.5mg  on Tuesday, Wednesday, Thursday, Saturday and Sunday and  on Monday and Friday   06/17/2015 at Unknown time  . amLODipine (NORVASC) 5 MG tablet Take 5 mg by mouth daily.   Not Taking at Unknown time  . atorvastatin (LIPITOR) 10 MG tablet Take 10 mg by mouth daily.   Not Taking at Unknown time  . calcium acetate (PHOSLO) 667 MG capsule Take 2,001 mg by mouth 3 (three) times daily with meals.    Not Taking at Unknown time  . lanthanum (FOSRENOL) 1000 MG chewable tablet Chew 1,000 mg by mouth 2 (two) times daily with a meal.   Not Taking at Unknown time  . multivitamin (RENA-VIT) TABS tablet Take 1 tablet by mouth daily.   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  Plan:  Will begin cefazolin 1 g iv q 24 hours.   Luisa Hart D 06/19/2015,1:59 PM

## 2015-06-19 NOTE — Consult Note (Signed)
PULMONARY / CRITICAL CARE MEDICINE   Name: Shane Hamilton MRN: [id number] DOB: [birth year]    ADMISSION DATE:  06/18/2015  INITIAL PRESENTATION:    MAJOR EVENTS/TEST RESULTS: 9/14 LE venous Dopplers >> No DVT  INDWELLING DEVICES::   MICRO DATA: LUE abscess 9/13 >> staph aureus   ANTIMICROBIALS:  Vanc 9/13 >>  Cefazolin 9/14 >>     HISTORY OF PRESENT ILLNESS:  Shane Hamilton is a 49 y.o. male with a history of severe obesity, ESRD, OSA and type 2 diabetes who presented 9/13 with LUE AVF abscess. Patient was admitted 9/12 to [facility] for AV fistula abscess he subsequently left AMA stating that he preferred to come to [facility] for evaluation since his vascular surgeon is at this hospital. Patient reports that he has had issues with his left arm fistula for the past week. Apparently he has been getting IV vancomycin and Ceptaz edema during dialysis. He developed progressive somnolence on the morning of 9/14 and blood gas revealed severe hypercarbia. He was transferred to ICU on NPPV  PAST MEDICAL HISTORY :   has a past medical history of Type 2 diabetes mellitus, uncontrolled; Morbid obesity; OSA (obstructive sleep apnea); Septic shock(785.52); Anemia; Hypertension; Seizures; Gout; ESRD (end stage renal disease); LOC (loss of consciousness); DVT (deep venous thrombosis) (2011); Cardiomegaly; Chronic anticoagulation; Chronic pancreatitis; and Secondary hyperparathyroidism.  has past surgical history that includes AV fistula placement and Amputation finger / thumb (Right). Prior to Admission medications   Medication Sig Start Date End Date Taking? Authorizing Provider  allopurinol (ZYLOPRIM) 300 MG tablet Take 300 mg by mouth 2 (two) times daily.   Yes Historical Provider, MD  cinacalcet (SENSIPAR) 60 MG tablet Take 60 mg by mouth daily.   Yes Historical Provider, MD  clonazePAM (KLONOPIN) 1 MG tablet Take 1 mg by mouth 2 (two) times daily.   Yes Historical Provider, MD   colchicine 0.6 MG tablet Take 0.6 mg by mouth daily.   Yes Historical Provider, MD  gabapentin (NEURONTIN) 300 MG capsule Take 300 mg by mouth every 8 (eight) hours.   Yes Historical Provider, MD  hydrOXYzine (ATARAX/VISTARIL) 25 MG tablet Take 25 mg by mouth 3 (three) times daily as needed.   Yes Historical Provider, MD  insulin aspart protamine-insulin aspart (NOVOLOG 70/30) (70-30) 100 UNIT/ML injection Inject 35 Units into the skin 3 (three) times daily with meals.    Yes Historical Provider, MD  insulin glargine (LANTUS) 100 UNIT/ML injection Inject 70 Units into the skin at bedtime.    Yes Historical Provider, MD  omeprazole (PRILOSEC) 20 MG capsule Take 20 mg by mouth 2 (two) times daily before a meal.    Yes Historical Provider, MD  Oxycodone HCl 10 MG TABS Take 10 mg by mouth every 6 (six) hours as needed (pain).    Yes Historical Provider, MD  warfarin (COUMADIN) 7.5 MG tablet Take 7.5 mg by mouth See admin instructions. Pt takes 7.5mg  on Tuesday, Wednesday, Thursday, Saturday and Sunday and 10mg  on Monday and Friday   Yes Historical Provider, MD  amLODipine (NORVASC) 5 MG tablet Take 5 mg by mouth daily.    Historical Provider, MD  atorvastatin (LIPITOR) 10 MG tablet Take 10 mg by mouth daily.    Historical Provider, MD  calcium acetate (PHOSLO) 667 MG capsule Take 2,001 mg by mouth 3 (three) times daily with meals.     Historical Provider, MD  lanthanum (FOSRENOL) 1000 MG chewable tablet Chew 1,000 mg by mouth 2 (two) times  daily with a meal.    Historical Provider, MD  multivitamin (RENA-VIT) TABS tablet Take 1 tablet by mouth daily.  12/05/14   Historical Provider, MD   Allergies  Allergen Reactions  . Aspirin Other (See Comments)    Reaction:  GI upset     FAMILY HISTORY:  indicated that his mother is alive. He indicated that his father is alive. He indicated that his maternal grandmother is deceased. He indicated that his maternal grandfather is deceased. He indicated that his  paternal grandmother is deceased. He indicated that his paternal grandfather is deceased.  SOCIAL HISTORY:  reports that he has never smoked. He has never used smokeless tobacco. He reports that he drinks alcohol. He reports that he does not use illicit drugs.  REVIEW OF SYSTEMS:  Unable to attend  SUBJECTIVE:   VITAL SIGNS: Temp:  [97.9 F (36.6 C)-100.1 F (37.8 C)] 100.1 F (37.8 C) (09/14 1400) Pulse Rate:  [89-121] 102 (09/14 1600) Resp:  [15-22] 15 (09/14 1600) BP: (85-154)/(48-99) 105/59 mmHg (09/14 1600) SpO2:  [89 %-98 %] 95 % (09/14 1600) Weight:  [199 kg (438 lb 11.5 oz)] 199 kg (438 lb 11.5 oz) (09/14 1400) HEMODYNAMICS:   VENTILATOR SETTINGS:   INTAKE / OUTPUT:  Intake/Output Summary (Last 24 hours) at 06/19/15 1633 Last data filed at 06/19/15 1534  Gross per 24 hour  Intake    773 ml  Output      0 ml  Net    773 ml    PHYSICAL EXAMINATION: General: Severely obese, RASS -3 to -4, Not F/C consistently Neuro: PERRL, EOMI, MAEs HEENT: plethoric, NCAT Cardiovascular: tachy, regular, no M Lungs: no adventitious sounds Abdomen: very obese, distant BS, nontender Ext: chronic stasis changes, trace symmetric edema, missing 1st and 2nd digits on RUE, diminished R radial pulse  LABS:  CBC  Recent Labs Lab 06/18/15 0418 06/18/15 1420 06/19/15 0622  WBC 10.9* 10.8* 11.0*  HGB 11.1* 11.2* 11.6*  HCT 35.6* 35.4* 36.9*  PLT 292 307 332   Coag's  Recent Labs Lab 06/17/15 2035 06/18/15 0418 06/19/15 0622  INR 2.12* 1.89* 1.53   BMET  Recent Labs Lab 06/18/15 0418 06/18/15 1420 06/19/15 0622  NA 135 135 137  K 3.3* 3.1* 4.1  CL 93* 92* 96*  CO2 27 27 24   BUN 51* 62* 69*  CREATININE 10.68* 11.62* 13.12*  GLUCOSE 184* 160* 193*   Electrolytes  Recent Labs Lab 06/18/15 0418 06/18/15 1420 06/19/15 0622  CALCIUM 8.8* 8.9 8.6*   Sepsis Markers  Recent Labs Lab 06/17/15 1355  LATICACIDVEN 1.55   ABG  Recent Labs Lab 06/19/15 1310  06/19/15 1540  PHART 7.14* 7.26*  PCO2ART 84* 59*  PO2ART 35* 41*   Liver Enzymes No results for input(s): AST, ALT, ALKPHOS, BILITOT, ALBUMIN in the last 168 hours. Cardiac Enzymes No results for input(s): TROPONINI, PROBNP in the last 168 hours. Glucose  Recent Labs Lab 06/17/15 2038 06/18/15 0806 06/18/15 2129 06/19/15 0733 06/19/15 1256  GLUCAP 249* 155* 215* 155* 105*    CXR:     ASSESSMENT / PLAN:  PULMONARY A: Acute on chronic hypercarbic resp failure due to decompensated OHS/OSA  PCO2 improved on NPPV High risk of intubation P:   Cont NPPV Careful O2 titration to maintain SpO2 88-94%  CARDIOVASCULAR A:  Borderline hypotension P:  MAP goal > 60 mmHg  RENAL A:   ESRD P:   Vasc surg to place trialysis cath today Dialysis planned after that Disucssed with  Dr Thedore Mins and Dr Gilda Crease  GASTROINTESTINAL A:   Dysphagia due to AM Chronic PPI use P:   SUP: IV PPI NPO until cognition improves  HEMATOLOGIC A:  Mild anemia without overt blood loss P:  DVT px: SQ heparin Monitor CBC intermittently Transfuse per usual guidelines   INFECTIOUS A:  LUE AVF abscess - staph aureus P:   Monitor temp, WBC count Micro and abx as above ID service following  ENDOCRINE A:   DM2, presently controlled P:   SSI changed to q 4 hr while NPO  NEUROLOGIC A:   Acute hypersomnolent encephalopathy - likely hypercarbic and/or uremic P:   RASS goal: 0 Avoid all sedating agents     FAMILY: Wife updated @ bedside   Billy Fischer, MD PCCM service Mobile [phone] Pager [phone]  06/19/2015, 4:33 PM

## 2015-06-19 NOTE — Plan of Care (Signed)
Problem: Phase I Progression Outcomes Goal: Voiding-avoid urinary catheter unless indicated Outcome: Not Progressing Dialysis patient

## 2015-06-19 NOTE — Procedures (Addendum)
Endotracheal Intubation: Patient required placement of an artificial airway secondary to resp failure.   Consent: Emergent  Hand washing performed prior to starting the procedure.   Medications administered for sedation prior to procedure: Midazolam 4 mg IV,  Vecuronium 10 mg IV, Fentanyl 100 mcg IV.   Procedure: A time out procedure was called and correct patient, name, & ID confirmed. Needed supplies and equipment were assembled and checked to include ETT, 10 ml syringe, Glidescope, Mac and Miller blades, suction, oxygen and bag mask valve, end tidal CO2 monitor. Patient was positioned to align the mouth and pharynx to facilitate visualization of the glottis.  Heart rate, SpO2 and blood pressure was continuously monitored during the procedure. Pre-oxygenation was conducted prior to intubation  The patient was given 50 mg of propofol 2 for sedation followed by 20 mg of etomidate IV push 1. The scope was used to visualize the posterior pharynx. The patient had a severely edematous and pendulous tongue. Therefore visualization of the cords was only briefly possible. Subsequent necrotic scope was placed into the posterior pharynx. There was some bleeding upon attempted placement of the 7.5 ET tube. Therefore, the ET tube was removed and suctioning was used to remove any blood in the posterior pharynx. Subsequently, the posterior pharynx was visualized second time. The vocal cords again could not be visualized due to the severely edematous and pendulous tongue. The ET tube was placed and CO2 confirmation device showed the ET tube placement was not correct. I was likely an esophageal intubation, therefore, was removed. Once again, the scope was used to visualize the cords on another occasion throughout the process. The patient's O2 sats remained above 90%. By this time the patient appeared to be waking therefore, he was given 75 mg of succinylcholine, however, he continued to have recurrent bogginess  respiratory movements, therefore, was given another 75 mg of succinylcholine fasciculations were seen. The airway was opened and the posterior pharynx was visualized. However, once again, the cords could not be directly visualized, nor was he epiglottis visualized. Soon after, the patient again began to clench his teeth and the scope had to be removed. Subsequently, the patient was given Sunday, 5 mg of succinylcholine. A Mac blade was used to visualize the posterior pharynx. The epiglottis was identified. A 7.5 endotracheal tube was placed anterior. Placement was confirmed by auscuitation of lungs with good breath sounds bilaterally and no stomach sounds.  Condensation was noted on endotracheal tube.  Pulse ox % improved.  CO2 detector in place with appropriate color change.     ETT was secured at 25 cm mark. She was placed on the ventilator.   Chest radiograph ordered and pending.   ET tube on chest x-ray was noted to be slightly high, therefore, was advanced to 27 cm repeat radiograph showed good placement.  Wells Guiles, M.D.

## 2015-06-19 NOTE — Progress Notes (Signed)
Post hd tx 

## 2015-06-19 NOTE — Progress Notes (Signed)
This note also relates to the following rows which could not be included: SpO2 - Cannot attach notes to unvalidated device data   HD tx start

## 2015-06-19 NOTE — Progress Notes (Signed)
Pt bs 51. Per hypoglycemic protocol, administered 25ml d50

## 2015-06-19 NOTE — Op Note (Signed)
OPERATIVE NOTE   PROCEDURE: 1. Insertion of triple-lumen central venous catheter left IJ approach.  PRE-OPERATIVE DIAGNOSIS: Sepsis with respiratory failure  POST-OPERATIVE DIAGNOSIS: Sepsis with respiratory failure  SURGEON: Renford Dills M.D.  ANESTHESIA: 1% lidocaine local infiltration  ESTIMATED BLOOD LOSS: Minimal cc  INDICATIONS:   Shane Hamilton is a 49 y.o. male who presents with sepsis with respiratory failure he is now been transferred to the intensive care unit his condition overall is deteriorating and he does not have adequate IV access.  DESCRIPTION: After obtaining full informed written consent, the patient was positioned supine. The left neck was prepped and draped in a sterile fashion. Ultrasound was placed in a sterile sleeve. Ultrasound was utilized to identify the left internal jugular vein which is noted to be echolucent and compressible indicating patency. Images recorded for the permanent record. Under real-time visualization a Seldinger needle is inserted into the vein and the guidewires advanced however it will not pass beyond 20 cm therefore a micro-sheath was inserted over the wire it aspirates and flushes easily and this is secured as a single-lumen central line.  Sterile dressing is applied  COMPLICATIONS: Inability to place a triple-lumen secondary to inability to advance the wire  CONDITION: Critical  Renford Dills, M.D. [facility] Office:  [phone]   06/19/2015, 6:40 PM

## 2015-06-19 NOTE — Progress Notes (Addendum)
Vancomycin stopped.not due

## 2015-06-19 NOTE — Consult Note (Signed)
[facility] Infectious Disease     Reason for Consult:Infected AV fistula,   Referring Physician: Rica Koyanagi Date of Admission:  06/18/2015   Active Problems:   AV fistula infection   HPI: Shane Hamilton is a 49 y.o. 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 Had stents placed in fistula in July. Has been seen by Dr Ronalee Belts who aspirated purulence from site. Has been started on IV vanco and received Zosyn 9/12 and 13th.  Had respiratory decompensation and currently in ICU on bipap. Temp 100.1.  Past Medical History  Diagnosis Date  . Type 2 diabetes mellitus, uncontrolled        . Morbid obesity   . OSA (obstructive sleep apnea)   . Septic shock(785.52)   . Anemia   . Hypertension   . Seizures   . Gout   . ESRD (end stage renal disease)   . LOC (loss of consciousness)   . DVT (deep venous thrombosis) 2011  . Cardiomegaly   . Chronic anticoagulation   . Chronic pancreatitis   . Secondary hyperparathyroidism    Past Surgical History  Procedure Laterality Date  . Av fistula placement    . Amputation finger / thumb Right    Social History  Substance Use Topics  . Smoking status: Never Smoker   . Smokeless tobacco: Never Used  . Alcohol Use: Yes     Comment: Occasional wine use   Family History  Problem Relation Age of Onset  . Diabetes Mother   . Hypertension Mother   . Diabetes Father   . Hypertension Father   . Hypertension Brother     Allergies:  Allergies  Allergen Reactions  . Aspirin Other (See Comments)    Reaction:  GI upset     Current antibiotics: Antibiotics Given (last 72 hours)    Date/Time Action Medication Dose Rate   06/18/15 2144 Given   vancomycin (VANCOCIN) 2,000 mg in sodium chloride 0.9 % 500 mL IVPB 2,000 mg 250 mL/hr   06/18/15 2144 Given   piperacillin-tazobactam (ZOSYN) IVPB 3.375 g 3.375 g 12.5 mL/hr   06/19/15 0942 Given    piperacillin-tazobactam (ZOSYN) IVPB 3.375 g 3.375 g 12.5 mL/hr      MEDICATIONS: . [START ON 06/20/2015] allopurinol  300 mg Oral Daily  . amLODipine  5 mg Oral Daily  . atorvastatin  10 mg Oral Daily  . calcium acetate  2,001 mg Oral TID WC  .  ceFAZolin (ANCEF) IV  1 g Intravenous Q24H  . cinacalcet  60 mg Oral Q breakfast  . gabapentin  300 mg Oral 3 times per day  . heparin subcutaneous  5,000 Units Subcutaneous Q12H  . insulin aspart  0-20 Units Subcutaneous 6 times per day  . insulin aspart protamine- aspart  35 Units Subcutaneous TID WC  . insulin glargine  70 Units Subcutaneous QHS  . lanthanum  1,000 mg Oral BID WC  . mometasone-formoterol  2 puff Inhalation BID  . multivitamin  1 tablet Oral Daily  . pantoprazole  40 mg Oral Daily  . potassium chloride  40 mEq Oral Once  . vancomycin  1,000 mg Intravenous Q M,W,F-HD    Review of Systems - unable to obtain    OBJECTIVE: Temp:  [97.9 F (36.6 C)-100.1 F (37.8 C)] 100.1 F (37.8 C) (09/14 1400) Pulse Rate:  [89-121] 110 (09/14 1400) Resp:  [18-22] 18 (09/14 1400) BP: (97-154)/(48-99) 97/63  mmHg (09/14 1400) SpO2:  [91 %-98 %] 93 % (09/14 1400) Weight:  [199 kg (438 lb 11.5 oz)] 199 kg (438 lb 11.5 oz) (09/14 1400) Physical Exam  Constitutional: obtunded, critically ill appearing, On Bipap, morbidly obesee  HENT: Bipap on face Mouth/Throat bipap Cardiovascular: Normal rate, regular rhythm distant hs Pulmonary/Chest: coarse rhonchi bil Abdominal: Soft. Obese Bowel sounds are normal. He exhibits no distension. There is no tenderness.  Lymphadenopathy: He has no cervical adenopathy.  Neurological: obtunded,  Skin: LUE with induration, redness, warmth around avf site with ulcer at base. Psychiatric: obtunded EXT 2+ edema bil le LABS: Results for orders placed or performed during the hospital encounter of 06/18/15 (from the past 48 hour(s))  Basic metabolic panel     Status: Abnormal   Collection Time: 06/18/15   2:20 PM  Result Value Ref Range   Sodium 135 135 - 145 mmol/L   Potassium 3.1 (L) 3.5 - 5.1 mmol/L   Chloride 92 (L) 101 - 111 mmol/L   CO2 27 22 - 32 mmol/L   Glucose, Bld 160 (H) 65 - 99 mg/dL   BUN 62 (H) 6 - 20 mg/dL   Creatinine, Ser 11.62 (H) 0.61 - 1.24 mg/dL   Calcium 8.9 8.9 - 10.3 mg/dL   GFR calc non Af Amer 4 (L) >60 mL/min   GFR calc Af Amer 5 (L) >60 mL/min    Comment: (NOTE) The eGFR has been calculated using the CKD EPI equation. This calculation has not been validated in all clinical situations. eGFR's persistently <60 mL/min signify possible Chronic Kidney Disease.    Anion gap 16 (H) 5 - 15  CBC with Differential/Platelet     Status: Abnormal   Collection Time: 06/18/15  2:20 PM  Result Value Ref Range   WBC 10.8 (H) 3.8 - 10.6 K/uL   RBC 3.36 (L) 4.40 - 5.90 MIL/uL   Hemoglobin 11.2 (L) 13.0 - 18.0 g/dL   HCT 35.4 (L) 40.0 - 52.0 %   MCV 105.3 (H) 80.0 - 100.0 fL   MCH 33.5 26.0 - 34.0 pg   MCHC 31.8 (L) 32.0 - 36.0 g/dL   RDW 17.9 (H) 11.5 - 14.5 %   Platelets 307 150 - 440 K/uL   Neutrophils Relative % 62 %   Neutro Abs 6.8 (H) 1.4 - 6.5 K/uL   Lymphocytes Relative 24 %   Lymphs Abs 2.6 1.0 - 3.6 K/uL   Monocytes Relative 9 %   Monocytes Absolute 1.0 0.2 - 1.0 K/uL   Eosinophils Relative 3 %   Eosinophils Absolute 0.3 0 - 0.7 K/uL   Basophils Relative 2 %   Basophils Absolute 0.2 (H) 0 - 0.1 K/uL  Body fluid culture     Status: None (Preliminary result)   Collection Time: 06/18/15  4:38 PM  Result Value Ref Range   Specimen Description ABSCESS    Special Requests NONE    Gram Stain PENDING    Culture      MODERATE GROWTH STAPHYLOCOCCUS AUREUS SUSCEPTIBILITIES TO FOLLOW    Report Status PENDING   Glucose, capillary     Status: Abnormal   Collection Time: 06/18/15  9:29 PM  Result Value Ref Range   Glucose-Capillary 215 (H) 65 - 99 mg/dL  Basic metabolic panel     Status: Abnormal   Collection Time: 06/19/15  6:22 AM  Result Value Ref  Range   Sodium 137 135 - 145 mmol/L   Potassium 4.1 3.5 - 5.1 mmol/L  Chloride 96 (L) 101 - 111 mmol/L   CO2 24 22 - 32 mmol/L   Glucose, Bld 193 (H) 65 - 99 mg/dL   BUN 69 (H) 6 - 20 mg/dL   Creatinine, Ser 13.12 (H) 0.61 - 1.24 mg/dL   Calcium 8.6 (L) 8.9 - 10.3 mg/dL   GFR calc non Af Amer 4 (L) >60 mL/min   GFR calc Af Amer 4 (L) >60 mL/min    Comment: (NOTE) The eGFR has been calculated using the CKD EPI equation. This calculation has not been validated in all clinical situations. eGFR's persistently <60 mL/min signify possible Chronic Kidney Disease.    Anion gap 17 (H) 5 - 15  CBC     Status: Abnormal   Collection Time: 06/19/15  6:22 AM  Result Value Ref Range   WBC 11.0 (H) 3.8 - 10.6 K/uL   RBC 3.45 (L) 4.40 - 5.90 MIL/uL   Hemoglobin 11.6 (L) 13.0 - 18.0 g/dL   HCT 36.9 (L) 40.0 - 52.0 %   MCV 106.8 (H) 80.0 - 100.0 fL   MCH 33.7 26.0 - 34.0 pg   MCHC 31.5 (L) 32.0 - 36.0 g/dL   RDW 17.9 (H) 11.5 - 14.5 %   Platelets 332 150 - 440 K/uL  Protime-INR     Status: Abnormal   Collection Time: 06/19/15  6:22 AM  Result Value Ref Range   Prothrombin Time 18.6 (H) 11.4 - 15.0 seconds   INR 1.53   Glucose, capillary     Status: Abnormal   Collection Time: 06/19/15  7:33 AM  Result Value Ref Range   Glucose-Capillary 155 (H) 65 - 99 mg/dL  Glucose, capillary     Status: Abnormal   Collection Time: 06/19/15 12:56 PM  Result Value Ref Range   Glucose-Capillary 105 (H) 65 - 99 mg/dL  Blood gas, arterial     Status: Abnormal (Preliminary result)   Collection Time: 06/19/15  1:10 PM  Result Value Ref Range   FIO2 0.36    Delivery systems NASAL CANNULA    Inspiratory PAP PENDING    Expiratory PAP PENDING    pH, Arterial 7.14 (LL) 7.350 - 7.450    Comment: CRITICAL RESULT CALLED TO, READ BACK BY AND VERIFIED WITH: CRITICAL VALUE 06/19/15,1320,DR. KALISETTI    pCO2 arterial 84 (HH) 32.0 - 48.0 mmHg    Comment: CRITICAL RESULT CALLED TO, READ BACK BY AND VERIFIED  WITH: CRITICAL VALUE 06/19/15,1320,DR. KALISETTI    pO2, Arterial PENDING 80.0 - 100.0 mmHg   Bicarbonate 28.6 (H) 21.0 - 28.0 mEq/L   Acid-base deficit 2.2 (H) 0.0 - 2.0 mmol/L   O2 Saturation 47.6 %   Patient temperature 37.0     Comment: CRITICAL RESULT CALLED TO, READ BACK BY AND VERIFIED WITH: CRITICAL VALUE,06/19/15,1320,DR.KALISETTI    Oxygen index PENDING    Collection site LEFT RADIAL    Sample type ARTERIAL DRAW    Allens test (pass/fail) YES (A) PASS   No components found for: ESR, C REACTIVE PROTEIN MICRO: Recent Results (from the past 720 hour(s))  Culture, routine-abscess     Status: None (Preliminary result)   Collection Time: 06/17/15  1:05 PM  Result Value Ref Range Status   Specimen Description ABSCESS LEFT ARM  Final   Special Requests NONE  Final   Gram Stain   Final    FEW WBC PRESENT,BOTH PMN AND MONONUCLEAR RARE SQUAMOUS EPITHELIAL CELLS PRESENT RARE GRAM POSITIVE COCCI IN PAIRS Performed at [facility]  Culture   Final    MODERATE STAPHYLOCOCCUS AUREUS Note: RIFAMPIN AND GENTAMICIN SHOULD NOT BE USED AS SINGLE DRUGS FOR TREATMENT OF STAPH INFECTIONS. Performed at [facility]    Report Status PENDING  Incomplete  Culture, blood (routine x 2)     Status: None (Preliminary result)   Collection Time: 06/17/15  1:20 PM  Result Value Ref Range Status   Specimen Description BLOOD RIGHT ARM  Final   Special Requests BOTTLES DRAWN AEROBIC AND ANAEROBIC 5ML  Final   Culture NO GROWTH 1 DAY  Final   Report Status PENDING  Incomplete  Culture, blood (routine x 2)     Status: None (Preliminary result)   Collection Time: 06/17/15  1:30 PM  Result Value Ref Range Status   Specimen Description BLOOD RIGHT HAND  Final   Special Requests BOTTLES DRAWN AEROBIC AND ANAEROBIC 5ML  Final   Culture NO GROWTH 1 DAY  Final   Report Status PENDING  Incomplete  Body fluid culture     Status: None (Preliminary result)   Collection Time: 06/18/15  4:38  PM  Result Value Ref Range Status   Specimen Description ABSCESS  Final   Special Requests NONE  Final   Gram Stain PENDING  Incomplete   Culture   Final    MODERATE GROWTH STAPHYLOCOCCUS AUREUS SUSCEPTIBILITIES TO FOLLOW    Report Status PENDING  Incomplete    IMAGING: US Venous Img Lower Bilateral  06/19/2015   CLINICAL DATA:  49 year old male with a history of swelling bilateral lower extremities  EXAM: BILATERAL LOWER EXTREMITY VENOUS DOPPLER ULTRASOUND  TECHNIQUE: Gray-scale sonography with graded compression, as well as color Doppler and duplex ultrasound were performed to evaluate the lower extremity deep venous systems from the level of the common femoral vein and including the common femoral, femoral, profunda femoral, popliteal and calf veins including the posterior tibial, peroneal and gastrocnemius veins when visible. The superficial great saphenous vein was also interrogated. Spectral Doppler was utilized to evaluate flow at rest and with distal augmentation maneuvers in the common femoral, femoral and popliteal veins.  COMPARISON:  None.  FINDINGS: RIGHT LOWER EXTREMITY  Common Femoral Vein: No evidence of thrombus. Normal compressibility, respiratory phasicity and response to augmentation.  Saphenofemoral Junction: No evidence of thrombus. Normal compressibility and flow on color Doppler imaging.  Profunda Femoral Vein: No evidence of thrombus. Normal compressibility and flow on color Doppler imaging.  Femoral Vein: No evidence of thrombus. Normal compressibility, respiratory phasicity and response to augmentation.  Popliteal Vein: No evidence of thrombus. Normal compressibility, respiratory phasicity and response to augmentation.  Calf Veins: Posterior tibial vein without occlusive thrombus. Peroneal veins not visualized.  Superficial Great Saphenous Vein: No evidence of thrombus. Normal compressibility and flow on color Doppler imaging.  Other Findings:  None.  LEFT LOWER EXTREMITY   Common Femoral Vein: No evidence of thrombus. Normal compressibility, respiratory phasicity and response to augmentation.  Saphenofemoral Junction: No evidence of thrombus. Normal compressibility and flow on color Doppler imaging.  Profunda Femoral Vein: No evidence of thrombus. Normal compressibility and flow on color Doppler imaging.  Femoral Vein: No evidence of thrombus. Normal compressibility, respiratory phasicity and response to augmentation.  Popliteal Vein: No evidence of thrombus. Normal compressibility, respiratory phasicity and response to augmentation.  Calf Veins: Posterior tibial vein without occlusive thrombus. Peroneal veins not well visualized.  Superficial Great Saphenous Vein: No evidence of thrombus. Normal compressibility and flow on color Doppler imaging.  Other Findings:  None.  IMPRESSION: Sonographic survey of the bilateral lower extremities negative for DVT.  Signed,  Dulcy Fanny. Earleen Newport, DO  [facility]  [facility]   Electronically Signed   By: Corrie Mckusick D.O.   On: 06/19/2015 12:57    Assessment:   Shane Hamilton is a 49 y.o. 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 Had stents placed in fistula in July. Has been seen by Dr Ronalee Belts who aspirated purulence from site - culture with staph aureus = sensis pending. Has been started on IV vanco and received Zosyn 9/12 and 13th.  Had respiratory decompensation and currently in ICU on bipap. Temp 100.1.   Recommendations Cont vanco and ancef pending sensitivities. Will need source control with removal of infected AVF and stents Would check bcx x 2  Thank you very much for allowing me to participate in the care of this patient. Please call with questions.   Cheral Marker. Ola Spurr, MD

## 2015-06-19 NOTE — Progress Notes (Signed)
Pre-hd tx 

## 2015-06-19 NOTE — Progress Notes (Signed)
Patient arrived to unit from ED on 9/13 around 0630 with an infection of left AV graft.  Alert and oriented; due to have debridement sometime tomorrow with placement of trialysis by Dr. Gilda Crease.  NPO after midnight.  Patient has sleep apnea but refuses to wear cpap.  Dialysis patient on M,T,TH, Sat.  Patient can only sleep on his stomach.  Wife at bedside; will continue to monitor.  Shane Hamilton  06/19/2015  4:57 AM

## 2015-06-19 NOTE — Progress Notes (Signed)
Pt bs rechecked, now 49

## 2015-06-19 NOTE — Progress Notes (Signed)
[facility] [place] [facility]   PATIENT NAME: Shane Hamilton    MR#:  [id number]  DATE OF BIRTH:  1966-07-11  SUBJECTIVE:  CHIEF COMPLAINT:   Chief Complaint  Patient presents with  . infection of fistula    - Patient admitted for left arm AV fistula infection. Has significant sleep apnea and refuse CPAP last night. -Sleepy all morning, gradually getting worse by afternoon. -ABG showing hypoxic hypercarbic respiratory acidosis, patient transferred to ICU. -Currently trying BiPAP  REVIEW OF SYSTEMS:  Review of Systems  Unable to perform ROS: critical illness    DRUG ALLERGIES:   Allergies  Allergen Reactions  . Aspirin Other (See Comments)    Reaction:  GI upset     VITALS:  Blood pressure 97/63, pulse 110, temperature 100.1 F (37.8 C), temperature source Axillary, resp. rate 18, height 5\' 9"  (1.753 m), weight 199 kg (438 lb 11.5 oz), SpO2 93 %.  PHYSICAL EXAMINATION:  Physical Exam  GENERAL:  49 y.o.-year-old morbidly obese patient lying in the bed, barely arousable  EYES: Pupils equal, round, reactive to light and accommodation. Proptosis of the eyes noted. Erythematous conjunctiva. No scleral icterus. Extraocular muscles intact.  HEENT: Head atraumatic, normocephalic. Oropharynx and nasopharynx clear.  NECK:  Neck is short and thick. Supple, no jugular venous distention. No thyroid enlargement, no tenderness.  LUNGS: Decreased bilateral breath sounds, no wheezes or crackles heard.. No use of accessory muscles of respiration.  CARDIOVASCULAR: S1, S2 normal. No murmurs, rubs, or gallops.  ABDOMEN: Obese abdomen. Soft, nontender, nondistended. Bowel sounds present. No organomegaly or mass.  EXTREMITIES: 1+ pedal edema present, no cyanosis, or clubbing.  Right hand first 2 digits indicated traumatic contracture injury. Left arm AV fistula appears infected with an open wound, no palpable thrill NEUROLOGIC: Cranial nerves II through XII are  intact. Muscle strength 5/5 in all extremities. Sensation intact. Gait not checked.  PSYCHIATRIC: The patient is very lethargic, arousable. SKIN: No obvious rash, lesion, or ulcer.    LABORATORY PANEL:   CBC  Recent Labs Lab 06/19/15 0622  WBC 11.0*  HGB 11.6*  HCT 36.9*  PLT 332   ------------------------------------------------------------------------------------------------------------------  Chemistries   Recent Labs Lab 06/19/15 0622  NA 137  K 4.1  CL 96*  CO2 24  GLUCOSE 193*  BUN 69*  CREATININE 13.12*  CALCIUM 8.6*   ------------------------------------------------------------------------------------------------------------------  Cardiac Enzymes No results for input(s): TROPONINI in the last 168 hours. ------------------------------------------------------------------------------------------------------------------  RADIOLOGY:  US Venous Img Lower Bilateral  06/19/2015   CLINICAL DATA:  49 year old male with a history of swelling bilateral lower extremities  EXAM: BILATERAL LOWER EXTREMITY VENOUS DOPPLER ULTRASOUND  TECHNIQUE: Gray-scale sonography with graded compression, as well as color Doppler and duplex ultrasound were performed to evaluate the lower extremity deep venous systems from the level of the common femoral vein and including the common femoral, femoral, profunda femoral, popliteal and calf veins including the posterior tibial, peroneal and gastrocnemius veins when visible. The superficial great saphenous vein was also interrogated. Spectral Doppler was utilized to evaluate flow at rest and with distal augmentation maneuvers in the common femoral, femoral and popliteal veins.  COMPARISON:  None.  FINDINGS: RIGHT LOWER EXTREMITY  Common Femoral Vein: No evidence of thrombus. Normal compressibility, respiratory phasicity and response to augmentation.  Saphenofemoral Junction: No evidence of thrombus. Normal compressibility and flow on color Doppler  imaging.  Profunda Femoral Vein: No evidence of thrombus. Normal compressibility and flow on color Doppler imaging.  Femoral Vein:  No evidence of thrombus. Normal compressibility, respiratory phasicity and response to augmentation.  Popliteal Vein: No evidence of thrombus. Normal compressibility, respiratory phasicity and response to augmentation.  Calf Veins: Posterior tibial vein without occlusive thrombus. Peroneal veins not visualized.  Superficial Great Saphenous Vein: No evidence of thrombus. Normal compressibility and flow on color Doppler imaging.  Other Findings:  None.  LEFT LOWER EXTREMITY  Common Femoral Vein: No evidence of thrombus. Normal compressibility, respiratory phasicity and response to augmentation.  Saphenofemoral Junction: No evidence of thrombus. Normal compressibility and flow on color Doppler imaging.  Profunda Femoral Vein: No evidence of thrombus. Normal compressibility and flow on color Doppler imaging.  Femoral Vein: No evidence of thrombus. Normal compressibility, respiratory phasicity and response to augmentation.  Popliteal Vein: No evidence of thrombus. Normal compressibility, respiratory phasicity and response to augmentation.  Calf Veins: Posterior tibial vein without occlusive thrombus. Peroneal veins not well visualized.  Superficial Great Saphenous Vein: No evidence of thrombus. Normal compressibility and flow on color Doppler imaging.  Other Findings:  None.  IMPRESSION: Sonographic survey of the bilateral lower extremities negative for DVT.  Signed,  Jaime S. Loreta Ave, DO  [facility]  [facility]   Electronically Signed   By: Gilmer Mor D.O.   On: 06/19/2015 12:57    EKG:  No orders found for this or any previous visit.  ASSESSMENT AND PLAN:   49 year old morbidly obese male with past medical history significant for sleep apnea, end-stage renal disease, diabetes mellitus and chronic DVT presents secondary to AV fistula  abscess. Also went into acute hypercarbic hypoxic respiratory failure.  #1 acute respiratory failure-hypoxic and hypercarbic -On 2 L home oxygen, supposed to be on CPAP at nighttime but patient not using it. Also refuses CPAP here last night. -ABG with respiratory acidosis. Appreciate pulmonary critical care consult -Patient transferred to ICU. We'll try BiPAP and repeat ABG, if worsening will need to be intubated. -Wife at bedside and updated. -Discontinue his Klonopin and narcotic medications. -duo nebs ordered  #2 AV fistula infection/abscess-blood cultures from [facility] from 06/17/2015 are negative so far -Wound cultures by aspirating his fistula yesterday in the emergency room by Dr. Gilda Crease are growing staph aureus -Discontinue Zosyn. On vancomycin and Ancef added at this time. Follow up sensitivities  #3 end-stage renal disease-on 4 times a week dialysis. -On Monday, Tuesday, Thursday and Saturday schedule. Last dialysis was on Monday. -Sevens critically low at this time, AV fistula takedown will be done at a later time -Permacath will be placed today. -Appreciate vascular and nephrology consults  #4 diabetes mellitus-on Lantus and NovoLog. Also on sliding scale insulin.  #5 hypertension-on Norvasc  #6 DVT prophylaxis-subcutaneous heparin  All the records are reviewed and case discussed with Care Management/Social Workerr. Management plans discussed with the patient, family and they are in agreement.  CODE STATUS: Full code  TOTAL critical CARE TIME SPENT IN TAKING CARE OF THIS PATIENT: 60 minutes.   POSSIBLE D/C IN 3 DAYS, DEPENDING ON CLINICAL CONDITION.   Enid Baas M.D on 06/19/2015 at 3:08 PM  Between 7am to 6pm - Pager - [phone]  After 6pm go to [email or web address] - password [email or web address] [facility]  [place]  [facility]  Office  [phone]  CC: Primary care physician; Darrow Bussing, MD

## 2015-06-19 NOTE — Progress Notes (Signed)
Initial Nutrition Assessment   INTERVENTION:   Coordination of Care: await diet advancement    NUTRITION DIAGNOSIS:   Inadequate oral intake related to acute illness as evidenced by NPO status.  GOAL:   Patient will meet greater than or equal to 90% of their needs   MONITOR:    (Energy Intake, Digestive System, Anthropometrics, Electrolyte/Renal Profile, Glucose Profile)  REASON FOR ASSESSMENT:    (Dialysis, RD Screen)    ASSESSMENT:    Pt admitted with AV fistula abcsess,  ESRD on HD 4 times weekly as outpatient. Rapid response this afternoon, pt lethargic, labored breathing, sats in 70s; pt being transferred to ICU  Past Medical History  Diagnosis Date  . Type 2 diabetes mellitus, uncontrolled        . Morbid obesity   . OSA (obstructive sleep apnea)   . Septic shock(785.52)   . Anemia   . Hypertension   . Seizures   . Gout   . ESRD (end stage renal disease)   . LOC (loss of consciousness)   . DVT (deep venous thrombosis) 2011  . Cardiomegaly   . Chronic anticoagulation   . Chronic pancreatitis   . Secondary hyperparathyroidism    Diet Order:  Diet NPO time specified Except for: Sips with Meds   Food and Nutrition Related History: unable to assess  Skin:  Reviewed, no issues  Last BM:  9/12   Electrolyte and Renal Profile:  Recent Labs Lab 06/18/15 0418 06/18/15 1420 06/19/15 0622  BUN 51* 62* 69*  CREATININE 10.68* 11.62* 13.12*  NA 135 135 137  K 3.3* 3.1* 4.1   Glucose Profile:   Recent Labs  06/18/15 2129 06/19/15 0733 06/19/15 1256  GLUCAP 215* 155* 105*   Protein Profile: No results for input(s): ALBUMIN in the last 168 hours.   Meds:  sensipar, phoslo, ss novolog, lantus, novolog 70/30, fosrenol, MVI  Nutrition Focused Physical Exam:  Unable to complete Nutrition-Focused physical exam at this time.    Height:   Ht Readings from Last 1 Encounters:  06/19/15  (1.753 m)    Weight:   Wt Readings from Last 1  Encounters:  06/19/15 438 lb 11.5 oz (199 kg)   Wt Readings from Last 10 Encounters:  06/18/15 429 lb 14.4 oz (195 kg)  06/18/15 432 lb 1.6 oz (196 kg)  01/21/15 432 lb 1.6 oz (196 kg)  01/15/15 497 lb (225.438 kg)  06/13/14 436 lb 8.2 oz (198.001 kg)  10/01/13 456 lb 12.7 oz (207.2 kg)  09/04/11 407 lb 12.8 oz (184.977 kg)  08/24/11 350 lb (158.759 kg)  08/05/11 399 lb (180.985 kg)    Ideal Body Weight:   78 kg  BMI:  Body mass index is 64.76 kg/(m^2).  Estimated Nutritional Needs:   Kcal:  2400-2600 kcals (BEE 1667, 1.2 AF, 1.1-1.3 IF)   Protein:  94-117 g (1.2-1.5 g/kg)   Fluid:  1000 mL plus UOP  HIGH Care Level  Romelle Starcher MS, RD, LDN [phone] Pager

## 2015-06-20 ENCOUNTER — Inpatient Hospital Stay: Payer: Medicare Other | Admitting: Anesthesiology

## 2015-06-20 ENCOUNTER — Encounter
Admission: EM | Disposition: A | Discharge status: Home Health | Payer: Self-pay | Source: Home / Self Care | Attending: Internal Medicine

## 2015-06-20 DIAGNOSIS — T827XXD Infection and inflammatory reaction due to other cardiac and vascular devices, implants and grafts, subsequent encounter: Secondary | ICD-10-CM

## 2015-06-20 DIAGNOSIS — I959 Hypotension, unspecified: Secondary | ICD-10-CM

## 2015-06-20 DIAGNOSIS — N186 End stage renal disease: Secondary | ICD-10-CM

## 2015-06-20 DIAGNOSIS — J9602 Acute respiratory failure with hypercapnia: Secondary | ICD-10-CM

## 2015-06-20 HISTORY — PX: AV FISTULA PLACEMENT: SHX1204

## 2015-06-20 HISTORY — PX: I & D EXTREMITY: SHX5045

## 2015-06-20 HISTORY — PX: APPLICATION OF WOUND VAC: SHX5189

## 2015-06-20 LAB — BLOOD GAS, ARTERIAL
ACID-BASE DEFICIT: 1.4 mmol/L (ref 0.0–2.0)
BICARBONATE: 26.6 meq/L (ref 21.0–28.0)
FIO2: 0.5
LHR: 22 {breaths}/min
MECHANICAL RATE: 22
MECHVT: 500 mL
O2 Saturation: 88.2 %
PEEP/CPAP: 8 cmH2O
PO2 ART: 63 mmHg — AB (ref 83.0–108.0)
Patient temperature: 37
pCO2 arterial: 58 mmHg — ABNORMAL HIGH (ref 32.0–48.0)
pH, Arterial: 7.27 — ABNORMAL LOW (ref 7.350–7.450)

## 2015-06-20 LAB — CBC
HCT: 34 % — ABNORMAL LOW (ref 40.0–52.0)
Hemoglobin: 10.6 g/dL — ABNORMAL LOW (ref 13.0–18.0)
MCH: 33.8 pg (ref 26.0–34.0)
MCHC: 31.3 g/dL — AB (ref 32.0–36.0)
MCV: 108 fL — AB (ref 80.0–100.0)
PLATELETS: 263 10*3/uL (ref 150–440)
RBC: 3.15 MIL/uL — ABNORMAL LOW (ref 4.40–5.90)
RDW: 17.9 % — AB (ref 11.5–14.5)
WBC: 10.9 10*3/uL — ABNORMAL HIGH (ref 3.8–10.6)

## 2015-06-20 LAB — BASIC METABOLIC PANEL
Anion gap: 13 (ref 5–15)
BUN: 45 mg/dL — AB (ref 6–20)
CALCIUM: 7.8 mg/dL — AB (ref 8.9–10.3)
CHLORIDE: 101 mmol/L (ref 101–111)
CO2: 25 mmol/L (ref 22–32)
CREATININE: 11.47 mg/dL — AB (ref 0.61–1.24)
GFR calc non Af Amer: 5 mL/min — ABNORMAL LOW (ref >60)
GFR, EST AFRICAN AMERICAN: 5 mL/min — AB (ref >60)
GLUCOSE: 142 mg/dL — AB (ref 65–99)
Potassium: 4.9 mmol/L (ref 3.5–5.1)
Sodium: 139 mmol/L (ref 135–145)

## 2015-06-20 LAB — GLUCOSE, CAPILLARY
GLUCOSE-CAPILLARY: 75 mg/dL (ref 65–99)
Glucose-Capillary: 116 mg/dL — ABNORMAL HIGH (ref 65–99)
Glucose-Capillary: 126 mg/dL — ABNORMAL HIGH (ref 65–99)
Glucose-Capillary: 146 mg/dL — ABNORMAL HIGH (ref 65–99)
Glucose-Capillary: 83 mg/dL (ref 65–99)
Glucose-Capillary: 89 mg/dL (ref 65–99)

## 2015-06-20 LAB — CULTURE, ROUTINE-ABSCESS

## 2015-06-20 LAB — TRIGLYCERIDES: Triglycerides: 269 mg/dL — ABNORMAL HIGH (ref <150)

## 2015-06-20 LAB — TSH: TSH: 1.23 u[IU]/mL (ref 0.350–4.500)

## 2015-06-20 SURGERY — IRRIGATION AND DEBRIDEMENT EXTREMITY
Anesthesia: General | Laterality: Left

## 2015-06-20 MED ORDER — VITAL HIGH PROTEIN PO LIQD
1000.0000 mL | ORAL | Status: DC
  Administered 2015-06-21: 1000 mL

## 2015-06-20 MED ORDER — FREE WATER
25.0000 mL | Status: DC
  Administered 2015-06-20 – 2015-06-21 (×4): 25 mL

## 2015-06-20 MED ORDER — NOREPINEPHRINE BITARTRATE 1 MG/ML IV SOLN
4000.0000 ug | INTRAVENOUS | Status: DC | PRN
  Administered 2015-06-20: 12 ug/min via INTRAVENOUS
  Administered 2015-06-20: 18:00:00 via INTRAVENOUS

## 2015-06-20 MED ORDER — GENTAMICIN SULFATE 40 MG/ML IJ SOLN
INTRAMUSCULAR | Status: DC | PRN
  Administered 2015-06-20: 80 mg

## 2015-06-20 MED ORDER — VITAL HIGH PROTEIN PO LIQD: 1000.0000 mL | ORAL | Status: DC

## 2015-06-20 MED ORDER — CHLORHEXIDINE GLUCONATE 0.12% ORAL RINSE (MEDLINE KIT)
15.0000 mL | Freq: Two times a day (BID) | OROMUCOSAL | Status: DC
  Administered 2015-06-20 – 2015-06-26 (×7): 15 mL via OROMUCOSAL
  Filled 2015-06-20 (×16): qty 15

## 2015-06-20 MED ORDER — BUPIVACAINE HCL (PF) 0.5 % IJ SOLN
INTRAMUSCULAR | Status: DC | PRN
  Administered 2015-06-20: 30 mL

## 2015-06-20 MED ORDER — ATORVASTATIN CALCIUM 20 MG PO TABS
10.0000 mg | ORAL_TABLET | Freq: Every day | ORAL | Status: DC
  Administered 2015-06-21 – 2015-06-26 (×5): 10 mg
  Filled 2015-06-20 (×5): qty 1

## 2015-06-20 MED ORDER — PROPOFOL 10 MG/ML IV BOLUS
INTRAVENOUS | Status: DC | PRN
  Administered 2015-06-20 (×3): 50 mg via INTRAVENOUS

## 2015-06-20 MED ORDER — ROCURONIUM BROMIDE 100 MG/10ML IV SOLN
INTRAVENOUS | Status: DC | PRN
  Administered 2015-06-20 (×2): 20 mg via INTRAVENOUS
  Administered 2015-06-20: 45 mg via INTRAVENOUS
  Administered 2015-06-20: 25 mg via INTRAVENOUS

## 2015-06-20 MED ORDER — ANTISEPTIC ORAL RINSE SOLUTION (CORINZ)
7.0000 mL | Freq: Four times a day (QID) | OROMUCOSAL | Status: DC
  Administered 2015-06-20 – 2015-06-26 (×14): 7 mL via OROMUCOSAL
  Filled 2015-06-20 (×29): qty 7

## 2015-06-20 MED ORDER — PRO-STAT SUGAR FREE PO LIQD: 30.0000 mL | Freq: Every day | ORAL | Status: DC

## 2015-06-20 MED ORDER — NOREPINEPHRINE 4 MG/250ML-% IV SOLN
0.0000 ug/min | INTRAVENOUS | Status: DC
  Administered 2015-06-20: 2 ug/min via INTRAVENOUS
  Administered 2015-06-20: 12 ug/min via INTRAVENOUS
  Administered 2015-06-21: 6 ug/min via INTRAVENOUS
  Administered 2015-06-21: 10 ug/min via INTRAVENOUS
  Filled 2015-06-20 (×4): qty 250

## 2015-06-20 MED ORDER — SODIUM CHLORIDE 0.9 % IV SOLN
INTRAVENOUS | Status: DC | PRN
  Administered 2015-06-20: 17:00:00 via INTRAVENOUS

## 2015-06-20 MED ORDER — HEPARIN SODIUM (PORCINE) 5000 UNIT/ML IJ SOLN
5000.0000 [IU] | Freq: Three times a day (TID) | INTRAMUSCULAR | Status: DC
  Administered 2015-06-21 – 2015-06-26 (×13): 5000 [IU] via SUBCUTANEOUS
  Filled 2015-06-20 (×13): qty 1

## 2015-06-20 MED ORDER — PHENYLEPHRINE HCL 10 MG/ML IJ SOLN
INTRAMUSCULAR | Status: DC | PRN
  Administered 2015-06-20 (×7): 100 ug via INTRAVENOUS

## 2015-06-20 MED ORDER — CHLORHEXIDINE GLUCONATE 0.12% ORAL RINSE (MEDLINE KIT)
15.0000 mL | Freq: Two times a day (BID) | OROMUCOSAL | Status: DC
  Filled 2015-06-20 (×4): qty 15

## 2015-06-20 MED ORDER — INSULIN GLARGINE 100 UNIT/ML ~~LOC~~ SOLN
35.0000 [IU] | Freq: Every day | SUBCUTANEOUS | Status: DC
  Administered 2015-06-20 – 2015-06-26 (×6): 35 [IU] via SUBCUTANEOUS
  Filled 2015-06-20 (×8): qty 0.35

## 2015-06-20 MED ORDER — FENTANYL CITRATE (PF) 100 MCG/2ML IJ SOLN
INTRAMUSCULAR | Status: DC | PRN
  Administered 2015-06-20 (×4): 50 ug via INTRAVENOUS

## 2015-06-20 MED ORDER — ALLOPURINOL 300 MG PO TABS
300.0000 mg | ORAL_TABLET | Freq: Every day | ORAL | Status: DC
Start: 2015-06-21 — End: 2015-06-21
  Administered 2015-06-21: 300 mg
  Filled 2015-06-20: qty 1

## 2015-06-20 MED ORDER — ONDANSETRON HCL 4 MG/2ML IJ SOLN
INTRAMUSCULAR | Status: DC | PRN
  Administered 2015-06-20: 4 mg via INTRAVENOUS

## 2015-06-20 MED ORDER — KETAMINE HCL 50 MG/ML IJ SOLN
INTRAMUSCULAR | Status: DC | PRN
  Administered 2015-06-20: 25 mg via INTRAVENOUS
  Administered 2015-06-20: 25 mg via INTRAMUSCULAR

## 2015-06-20 MED ORDER — MIDAZOLAM HCL 2 MG/2ML IJ SOLN
INTRAMUSCULAR | Status: DC | PRN
  Administered 2015-06-20: 3 mg via INTRAVENOUS
  Administered 2015-06-20 (×2): 2 mg via INTRAVENOUS

## 2015-06-20 MED ORDER — SODIUM CHLORIDE 0.9 % IR SOLN
Status: DC | PRN
  Administered 2015-06-20: 100 mL

## 2015-06-20 MED ORDER — GENTAMICIN SULFATE 40 MG/ML IJ SOLN
INTRAMUSCULAR | Status: AC
  Filled 2015-06-20: qty 2

## 2015-06-20 MED ORDER — ANTISEPTIC ORAL RINSE SOLUTION (CORINZ)
7.0000 mL | Freq: Four times a day (QID) | OROMUCOSAL | Status: DC
  Filled 2015-06-20 (×5): qty 7

## 2015-06-20 MED ORDER — NOREPINEPHRINE BITARTRATE 1 MG/ML IV SOLN: 0.0000 ug/min | INTRAVENOUS | Status: DC

## 2015-06-20 SURGICAL SUPPLY — 66 items
APPLIER CLIP 9.375 SM OPEN (CLIP)
BAG DECANTER FOR FLEXI CONT (MISCELLANEOUS) ×3 IMPLANT
BNDG COHESIVE 4X5 TAN STRL (GAUZE/BANDAGES/DRESSINGS) ×3 IMPLANT
BRUSH SCRUB 4% CHG (MISCELLANEOUS) IMPLANT
CANISTER SUCT 1200ML W/VALVE (MISCELLANEOUS) ×3 IMPLANT
CHLORAPREP W/TINT 26ML (MISCELLANEOUS) IMPLANT
CLIP APPLIE 9.375 SM OPEN (CLIP) IMPLANT
COBAN 4 " IMPLANT
COVER LIGHT HANDLE STERIS (MISCELLANEOUS) ×3 IMPLANT
DECANTER SPIKE VIAL GLASS SM (MISCELLANEOUS) IMPLANT
DURAPREP 26ML APPLICATOR (WOUND CARE) IMPLANT
ELECT CAUTERY BLADE 6.4 (BLADE) IMPLANT
GAUZE SPONGE 4X4 12PLY STRL (GAUZE/BANDAGES/DRESSINGS) IMPLANT
GAUZE STRETCH 2X75IN STRL (MISCELLANEOUS) IMPLANT
GLOVE BIO SURGEON STRL SZ7.5 (GLOVE) ×9 IMPLANT
GLOVE BIOGEL PI IND STRL 7.0 (GLOVE) ×2 IMPLANT
GLOVE BIOGEL PI INDICATOR 7.0 (GLOVE) ×4
GLOVE SURG SYN 8.0 (GLOVE) ×3 IMPLANT
GOWN STRL REUS W/ TWL LRG LVL3 (GOWN DISPOSABLE) ×3 IMPLANT
GOWN STRL REUS W/ TWL XL LVL3 (GOWN DISPOSABLE) ×1 IMPLANT
GOWN STRL REUS W/TWL LRG LVL3 (GOWN DISPOSABLE) ×6
GOWN STRL REUS W/TWL XL LVL3 (GOWN DISPOSABLE) ×2
HANDLE YANKAUER SUCT BULB TIP (MISCELLANEOUS) ×3 IMPLANT
HEMOSTAT SURGICEL 2X3 (HEMOSTASIS) IMPLANT
IV NS 500ML (IV SOLUTION)
IV NS 500ML BAXH (IV SOLUTION) IMPLANT
KIT DRSG VAC SLVR GRANUFM (MISCELLANEOUS) ×3 IMPLANT
KIT RM TURNOVER STRD PROC AR (KITS) ×3 IMPLANT
LABEL OR SOLS (LABEL) ×3 IMPLANT
LIQUID BAND (GAUZE/BANDAGES/DRESSINGS) ×3 IMPLANT
LOOP RED MAXI  1X406MM (MISCELLANEOUS) ×2
LOOP VESSEL MAXI 1X406 RED (MISCELLANEOUS) ×1 IMPLANT
LOOP VESSEL MINI 0.8X406 BLUE (MISCELLANEOUS) IMPLANT
LOOPS BLUE MINI 0.8X406MM (MISCELLANEOUS)
MONOCRYL IMPLANT
MONOCRYL 4-0 IMPLANT
NEEDLE HYPO 25X1 1.5 SAFETY (NEEDLE) IMPLANT
NS IRRIG 500ML POUR BTL (IV SOLUTION) ×3 IMPLANT
PACK EXTREMITY ARMC (MISCELLANEOUS) ×3 IMPLANT
PAD GROUND ADULT SPLIT (MISCELLANEOUS) ×3 IMPLANT
PAD NEG PRESSURE SENSATRAC (MISCELLANEOUS) ×3 IMPLANT
PAD PREP 24X41 OB/GYN DISP (PERSONAL CARE ITEMS) ×3 IMPLANT
SILVER GRANUFOAM IMPLANT
SPONGE LAP 18X18 5 PK (GAUZE/BANDAGES/DRESSINGS) ×3 IMPLANT
STOCKINETTE IMPERVIOUS 9X36 MD (GAUZE/BANDAGES/DRESSINGS) ×3 IMPLANT
STOCKINETTE STRL 4IN 9604848 (GAUZE/BANDAGES/DRESSINGS) IMPLANT
STOCKINETTE STRL 6IN 960660 (GAUZE/BANDAGES/DRESSINGS) ×3 IMPLANT
SUT MNCRL 4-0 (SUTURE) ×6
SUT MNCRL 4-0 27XMFL (SUTURE) ×3
SUT MNCRL+ 5-0 UNDYED PC-3 (SUTURE) IMPLANT
SUT MONOCRYL 5-0 (SUTURE)
SUT PROLENE 2 0 SH DA (SUTURE) ×3 IMPLANT
SUT PROLENE 6 0 BV (SUTURE) IMPLANT
SUT SILK 2 0 (SUTURE) ×2
SUT SILK 2-0 18XBRD TIE 12 (SUTURE) ×1 IMPLANT
SUT SILK 3 0 (SUTURE) ×2
SUT SILK 3-0 18XBRD TIE 12 (SUTURE) ×1 IMPLANT
SUT SILK 4 0 (SUTURE) ×2
SUT SILK 4-0 18XBRD TIE 12 (SUTURE) ×1 IMPLANT
SUT VIC AB 3-0 SH 27 (SUTURE) ×6
SUT VIC AB 3-0 SH 27X BRD (SUTURE) ×3 IMPLANT
SUTURE MNCRL 4-0 27XMF (SUTURE) ×3 IMPLANT
SYR 20CC LL (SYRINGE) IMPLANT
SYRINGE 10CC LL (SYRINGE) IMPLANT
VICRYL IMPLANT
WND VAC CANISTER 500ML (MISCELLANEOUS) ×3 IMPLANT

## 2015-06-20 NOTE — Progress Notes (Signed)
[facility] [place] at [facility]   PATIENT NAME: Shane Hamilton    MR#:  [id number]  DATE OF BIRTH:  1965/11/20  SUBJECTIVE:  CHIEF COMPLAINT:   Chief Complaint  Patient presents with  . infection of fistula    - Patient was placed on BiPAP yesterday, increased work of breathing requiring intubation -Intubated and on sedation. Blood pressure is borderline. -Wound cultures from left arm AV graft growing MSSA  REVIEW OF SYSTEMS:  Review of Systems  Unable to perform ROS: critical illness    DRUG ALLERGIES:   Allergies  Allergen Reactions  . Aspirin Other (See Comments)    Reaction:  GI upset     VITALS:  Blood pressure 112/56, pulse 90, temperature 100 F (37.8 C), temperature source Axillary, resp. rate 22, height 5\' 9"  (1.753 m), weight 199 kg (438 lb 11.5 oz), SpO2 98 %.  PHYSICAL EXAMINATION:  Physical Exam  GENERAL:  49 y.o.-year-old morbidly obese patient lying in the bed, on ventilator  EYES: Pupils equal, round, reactive to light and accommodation. Proptosis of the eyes noted. Erythematous conjunctiva. No scleral icterus.   HEENT: Head atraumatic, normocephalic. Oropharynx and nasopharynx clear.  Orally intubated, subglottic suction is also present, right nare-NG tube present NECK:  Neck is short and thick. Supple, no jugular venous distention. No thyroid enlargement, no tenderness.  LUNGS: Decreased bilateral breath sounds, no wheezes or crackles heard.. No use of accessory muscles of respiration. Decreased bibasilar breath sounds CARDIOVASCULAR: S1, S2 normal. No murmurs, rubs, or gallops.  ABDOMEN: Obese abdomen. Soft, nontender, nondistended. Bowel sounds present. No organomegaly or mass.  EXTREMITIES: 1+ pedal edema present, no cyanosis, or clubbing.  Right hand first 2 digits indicated traumatic contracture injury. Left arm AV fistula appears infected with an open wound, no palpable thrill NEUROLOGIC: On sedation. Withdrawing to  pain  PSYCHIATRIC: The patient is sedated SKIN: No obvious rash, lesion, or ulcer.    LABORATORY PANEL:   CBC  Recent Labs Lab 06/20/15 0212  WBC 10.9*  HGB 10.6*  HCT 34.0*  PLT 263   ------------------------------------------------------------------------------------------------------------------  Chemistries   Recent Labs Lab 06/20/15 0212  NA 139  K 4.9  CL 101  CO2 25  GLUCOSE 142*  BUN 45*  CREATININE 11.47*  CALCIUM 7.8*   ------------------------------------------------------------------------------------------------------------------  Cardiac Enzymes No results for input(s): TROPONINI in the last 168 hours. ------------------------------------------------------------------------------------------------------------------  RADIOLOGY:  Dg Abd 1 View  06/20/2015   CLINICAL DATA:  NG tube placement.  EXAM: ABDOMEN - 1 VIEW  COMPARISON:  None.  FINDINGS: The tip of the enteric tube is below the diaphragm in the stomach. Side port appears to be just beyond the gastroesophageal junction, however is only faintly visualized. Morbid obesity significantly limits evaluation.  IMPRESSION: Tip of the enteric tube in the stomach. Side port is likely beyond the gastroesophageal junction.   Electronically Signed   By: Rubye Oaks M.D.   On: 06/20/2015 03:30   US Venous Img Lower Bilateral  06/19/2015   CLINICAL DATA:  49 year old male with a history of swelling bilateral lower extremities  EXAM: BILATERAL LOWER EXTREMITY VENOUS DOPPLER ULTRASOUND  TECHNIQUE: Gray-scale sonography with graded compression, as well as color Doppler and duplex ultrasound were performed to evaluate the lower extremity deep venous systems from the level of the common femoral vein and including the common femoral, femoral, profunda femoral, popliteal and calf veins including the posterior tibial, peroneal and gastrocnemius veins when visible. The superficial great saphenous vein was also  interrogated. Spectral Doppler was utilized to evaluate flow at rest and with distal augmentation maneuvers in the common femoral, femoral and popliteal veins.  COMPARISON:  None.  FINDINGS: RIGHT LOWER EXTREMITY  Common Femoral Vein: No evidence of thrombus. Normal compressibility, respiratory phasicity and response to augmentation.  Saphenofemoral Junction: No evidence of thrombus. Normal compressibility and flow on color Doppler imaging.  Profunda Femoral Vein: No evidence of thrombus. Normal compressibility and flow on color Doppler imaging.  Femoral Vein: No evidence of thrombus. Normal compressibility, respiratory phasicity and response to augmentation.  Popliteal Vein: No evidence of thrombus. Normal compressibility, respiratory phasicity and response to augmentation.  Calf Veins: Posterior tibial vein without occlusive thrombus. Peroneal veins not visualized.  Superficial Great Saphenous Vein: No evidence of thrombus. Normal compressibility and flow on color Doppler imaging.  Other Findings:  None.  LEFT LOWER EXTREMITY  Common Femoral Vein: No evidence of thrombus. Normal compressibility, respiratory phasicity and response to augmentation.  Saphenofemoral Junction: No evidence of thrombus. Normal compressibility and flow on color Doppler imaging.  Profunda Femoral Vein: No evidence of thrombus. Normal compressibility and flow on color Doppler imaging.  Femoral Vein: No evidence of thrombus. Normal compressibility, respiratory phasicity and response to augmentation.  Popliteal Vein: No evidence of thrombus. Normal compressibility, respiratory phasicity and response to augmentation.  Calf Veins: Posterior tibial vein without occlusive thrombus. Peroneal veins not well visualized.  Superficial Great Saphenous Vein: No evidence of thrombus. Normal compressibility and flow on color Doppler imaging.  Other Findings:  None.  IMPRESSION: Sonographic survey of the bilateral lower extremities negative for DVT.   Signed,  Yvone Neu. Loreta Ave, DO  [facility]  [facility]   Electronically Signed   By: Gilmer Mor D.O.   On: 06/19/2015 12:57   Dg Chest Port 1 View  06/20/2015   CLINICAL DATA:  Endotracheal tube placement.  EXAM: PORTABLE CHEST - 1 VIEW  COMPARISON:  Earlier this day at 1833 hour  FINDINGS: Endotracheal tube is 3.4 cm from the carina. Enteric tube is in place, tip below the diaphragm. The previous left venous catheter is no longer seen. Lung volumes remain low. Pulmonary edema is unchanged. Increasing right basilar atelectasis. Cardiomediastinal contours are unchanged. No pneumothorax.  IMPRESSION: 1. Endotracheal tube 3.4 cm from the carina.  Enteric tube in place. 2. Increasing right basilar atelectasis. 3. Unchanged pulmonary edema.   Electronically Signed   By: Rubye Oaks M.D.   On: 06/20/2015 00:43   Dg Chest Port 1 View  06/19/2015   CLINICAL DATA:  Evaluate the central line placement.  The new  EXAM: PORTABLE CHEST - 1 VIEW  COMPARISON:  No radiographic 01/15/2015  FINDINGS: LEFT central venous line placed from a subclavian approach. The catheter extends cephalad into the jugular vein. No pneumothorax. Low lung volumes with venous congestion and mild pulmonary edema.  IMPRESSION: 1. Malposition of the LEFT central venous line with tip extending cephalad into the jugular vein. 2. No pneumothorax 3. Pulmonary edema not changed  These results will be called to the ordering clinician or representative by the Radiologist Assistant, and communication documented in the PACS or zVision Dashboard.   Electronically Signed   By: Genevive Bi M.D.   On: 06/19/2015 18:49    EKG:  No orders found for this or any previous visit.  ASSESSMENT AND PLAN:   49 year old morbidly obese male with past medical history significant for sleep apnea, end-stage renal disease, diabetes mellitus and chronic DVT presents secondary  to AV fistula abscess. Also went  into acute hypercarbic hypoxic respiratory failure.  #1 Acute respiratory failure-hypoxic and hypercarbic -On 2 L home oxygen, supposed to be on CPAP at nighttime but patient not using it.  - Appreciate pulmonary consult. -Intubated and on ventilator at this time. PEEP of 8 and FiO2 50% this morning  #2 Sepsis and AV fistula infection/abscess-blood cultures from [facility] from 06/17/2015 are negative so far -Wound cultures by aspirating his fistula  in the emergency room by Dr. Gilda Crease - growing MSSA - appreciate ID consult, placed on ancef. Vancomycin now discontinued -AV graft abscess surgical removal planned by vascular surgery in the OR-likely today  #3 End-stage renal disease-on 4 times a week dialysis. -On Monday, Tuesday, Thursday and Saturday schedule.  -, AV fistula infection with MSSA abscess - temp dialysis catheter placed and had dialysis yesterday and possibly again today -Appreciate vascular and nephrology consults  #4 hypotension-post intubation and while on sedation. -Hold blood pressure medications for now. Levophed drip if needed. - possible sepsis from AV graft infection  #5 diabetes mellitus-Lantus dose is decreased to half as patient is nothing by mouth and SLIDING SCALE INSULIN.  #6 DVT prophylaxis-subcutaneous heparin  #7 GI prophylaxis-on Protonix  All the records are reviewed and case discussed with Care Management/Social Workerr. Management plans discussed with the patient, family and they are in agreement.  CODE STATUS: Full code  TOTAL critical CARE TIME SPENT IN TAKING CARE OF THIS PATIENT: 37 minutes.   POSSIBLE D/C IN 3 DAYS, DEPENDING ON CLINICAL CONDITION.   Enid Baas M.D on 06/20/2015 at 1:53 PM  Between 7am to 6pm - Pager - [phone]  After 6pm go to [email or web address] - password [email or web address] [facility]  [facility]  Office  [phone]  CC: Primary care physician; Darrow Bussing, MD

## 2015-06-20 NOTE — Progress Notes (Signed)
HD tx completed.

## 2015-06-20 NOTE — Progress Notes (Signed)
S/w Dr Gilda Crease regarding consent for procedure scheduled for this afternoon, Dr. Gilda Crease advised the consent in pt chart that was signed by the pts wife on 06/18/15  is valid and sufficient.

## 2015-06-20 NOTE — Transfer of Care (Signed)
Immediate Anesthesia Transfer of Care Note  Patient: Shane Hamilton  Procedure(s) Performed: Procedure(s): IRRIGATION AND DEBRIDEMENT EXTREMITY (Left) APPLICATION OF WOUND VAC (Left) ARTERIOVENOUS (AV) FISTULA  ligation , excision (Left)  Patient Location: ICU  Anesthesia Type:General  Level of Consciousness: sedated and Patient remains intubated per anesthesia plan  Airway & Oxygen Therapy: Patient remains intubated per anesthesia plan and Patient placed on Ventilator (see vital sign flow sheet for setting)  Post-op Assessment: Report given to RN  Post vital signs: Reviewed  Last Vitals:  Filed Vitals:   06/20/15 1935  BP: 116/62  Pulse: 88  Temp:   Resp: 16    Complications: No apparent anesthesia complications

## 2015-06-20 NOTE — Progress Notes (Signed)
Post hd tx 

## 2015-06-20 NOTE — Op Note (Signed)
  OPERATIVE NOTE   PROCEDURE: 1. Excision of infected left arm basilic AV fistula with multiple previously placed stents  PRE-OPERATIVE DIAGNOSIS: Infected left arm basilic transposition; sepsis with multisystem organ failure requiring intubation  POST-OPERATIVE DIAGNOSIS: Same  SURGEON: Renford Dills, M.D. ASSISTANT(S): Ms. Raul Del  ANESTHESIA: general  ESTIMATED BLOOD LOSS: 400 cc  FINDING(S): 1.  Infected basilic vein with multiple stents  SPECIMEN(S):  Multiple stents and segments of infected vein with debrided tissue  INDICATIONS:   ABAS LEICHT is a 49 y.o. male who presents with infected basilic transposition and sepsis with multisystem organ failure.  DESCRIPTION: After obtaining full informed written consent, the patient was brought back to the operating room and placed supine in his ICU bed as he is too large to place on 1 OR table in the hand table was rolled perpendicular to the bed.  The patient received IV antibiotics prior to induction.  After obtaining adequate anesthesia, the patient's left arm was prepped and draped in the standard fashion appropriate time out is called.  Half percent Marcaine with epinephrine is infiltrated in soft tissues overlying the arterial and venous portions of the AV fistula  Curvilinear incision is created overlying the arterial anastomosis the dissection is carried down to expose the basilic vein which is then ligated and divided just above the suture line. Wound is irrigated and then packed with a sponge.  Curvilinear incision is then created in the axilla and the dissection is carried down to expose the more proximal basilic vein. Basilic vein is transected and the first of several stents is excised. The dissection is then carried along the basilic vein more proximally until the axillary vein is clearly identified and a vessel loop can be passed above the stent the remaining stents at this level are then removed in their  entirety without further difficulty.  A circular incision is created around the abscess and necrotic skin and the dissection is carried down to expose the fistula and the stent. This is clearly visualized in the base of the wound. Stent is then circumferentially dissected and removed both proximally and distally. Distally the entire segment comes out without difficulty. Proximally there is a residual segment and therefore a fourth incision is created transversely overlying the vein is carried out through the soft tissues the vein is isolated dissected circumferentially and then ligated and divided proximally and distally to the remaining segment of stent. This then excises all of the stent material in their entirety.  The 3 incisions are then irrigated and closed in layers using 3-0 Vicryl followed by 4-0 Monocryl. The circular incision is irrigated and inspected for hemostasis which was achieved with Bovie cautery and then a VAC dressing is applied.  The patient tolerated procedure without changes in his hemodynamics is returned to the intensive care unit in medical condition.  COMPLICATIONS: None  CONDITION: Clelia Schaumann, M.D. [facility] Office: [phone]   06/20/2015, 7:21 PM

## 2015-06-20 NOTE — Progress Notes (Signed)
PULMONARY / CRITICAL CARE MEDICINE   Name: Shane Hamilton MRN: [id number] DOB: Feb 11, 1966    ADMISSION DATE:  06/18/2015  INITIAL PRESENTATION:    MAJOR EVENTS/TEST RESULTS: 9/14 LE venous Dopplers >> No DVT 9/14 Vasc surgery consultation. Surgical drainage of LUE abscess planned for 9/15 9/14 Renal consultation. HD performed 9/14 PM: intubated. Hypotension post procedure. Norepinephrine gtt initiated 9/15 HD performed  INDWELLING DEVICES:: ETT 9/14 >>  R femoral HD cath 9/14 >>   MICRO DATA: LUE abscess 9/13 >> MSSA Blood 9/14 >>   ANTIMICROBIALS:  Vanc 9/13 >> 9/14 Cefazolin 9/14 >>    VITAL SIGNS: Temp:  [98.8 F (37.1 C)-100.1 F (37.8 C)] 100 F (37.8 C) (09/15 1200) Pulse Rate:  [90-127] 90 (09/15 1300) Resp:  [15-30] 22 (09/15 1300) BP: (82-141)/(33-101) 108/49 mmHg (09/15 1300) SpO2:  [77 %-100 %] 98 % (09/15 1300) FiO2 (%):  [40 %-50 %] 40 % (09/15 1200) Weight:  [199 kg (438 lb 11.5 oz)] 199 kg (438 lb 11.5 oz) (09/14 2215) HEMODYNAMICS:   VENTILATOR SETTINGS: Vent Mode:  [-] PRVC FiO2 (%):  [40 %-50 %] 40 % Set Rate:  [22 bmp] 22 bmp Vt Set:  [500 mL] 500 mL PEEP:  [5 cmH20-8 cmH20] 5 cmH20 Plateau Pressure:  [25 cmH20] 25 cmH20 INTAKE / OUTPUT:  Intake/Output Summary (Last 24 hours) at 06/20/15 1309 Last data filed at 06/20/15 0925  Gross per 24 hour  Intake 664.91 ml  Output    -43 ml  Net 707.91 ml    PHYSICAL EXAMINATION: General: Severely obese, RASS -3, Not F/C  Neuro: PERRL, EOMI, MAEs HEENT: plethoric, NCAT Cardiovascular: tachy, regular, no M Lungs: no adventitious sounds anteriorly Abdomen: very obese, distant BS, nontender Ext: chronic stasis changes, trace symmetric edema, missing 1st and 2nd digits on RUE, diminished R radial pulse  LABS:  CBC  Recent Labs Lab 06/18/15 1420 06/19/15 0622 06/20/15 0212  WBC 10.8* 11.0* 10.9*  HGB 11.2* 11.6* 10.6*  HCT 35.4* 36.9* 34.0*  PLT 307 332 263   Coag's  Recent  Labs Lab 06/17/15 2035 06/18/15 0418 06/19/15 0622  INR 2.12* 1.89* 1.53   BMET  Recent Labs Lab 06/18/15 1420 06/19/15 0622 06/20/15 0212  NA 135 137 139  K 3.1* 4.1 4.9  CL 92* 96* 101  CO2 27 24 25   BUN 62* 69* 45*  CREATININE 11.62* 13.12* 11.47*  GLUCOSE 160* 193* 142*   Electrolytes  Recent Labs Lab 06/18/15 1420 06/19/15 0622 06/20/15 0212  CALCIUM 8.9 8.6* 7.8*   Sepsis Markers  Recent Labs Lab 06/17/15 1355  LATICACIDVEN 1.55   ABG  Recent Labs Lab 06/19/15 1310 06/19/15 1540 06/20/15 0030  PHART 7.14* 7.26* 7.27*  PCO2ART 84* 59* 58*  PO2ART 35* 41* 63*   Liver Enzymes No results for input(s): AST, ALT, ALKPHOS, BILITOT, ALBUMIN in the last 168 hours. Cardiac Enzymes No results for input(s): TROPONINI, PROBNP in the last 168 hours. Glucose  Recent Labs Lab 06/19/15 1725 06/19/15 1953 06/20/15 0043 06/20/15 0356 06/20/15 0710 06/20/15 1148  GLUCAP 78 93 146* 116* 75 83    CXR: CM, edema pattern    ASSESSMENT / PLAN:  PULMONARY A: Acute on chronic hypercarbic resp failure Decompensated OHS/OSA Pulm edema P:   Cont full vent support - settings reviewed and/or adjusted Cont vent bundle Daily SBT if/when meets criteria  CARDIOVASCULAR A:  Hypotension - likely related to sedation P:  MAP goal > 60 mmHg Cont norepi  RENAL A:  ESRD P:   Monitor BMET intermittently Monitor I/Os Correct electrolytes as indicated Further HD planned 9/15  GASTROINTESTINAL A:   Chronic PPI use P:   SUP: IV PPI Holding TFs until after surgery today  HEMATOLOGIC A:  Mild anemia without overt blood loss P:  DVT px: SQ heparin Monitor CBC intermittently Transfuse per usual guidelines   INFECTIOUS A:  LUE AVF abscess - staph aureus P:   Monitor temp, WBC count Micro and abx as above ID service following  ENDOCRINE A:   DM2, presently controlled Risk of hypoglycemia - NPO, renal failure P:   Lantus dose  reduced Cont SSI  NEUROLOGIC A:   Acute hypersomnolent encephalopathy - likely hypercarbic and/or uremic P:   RASS goal: -1 Cont propofol Daily WUA   FAMILY: Wife updated @ bedside   CCM time: 35 mins The above time includes time spent in consultation with patient and/or family members and reviewing care plan on multidisciplinary rounds  Billy Fischer, MD PCCM service Mobile [phone] Pager [phone]  06/20/2015, 1:09 PM

## 2015-06-20 NOTE — Progress Notes (Signed)
ID E note Pt progressed to needing intubation due to hypercarbiac resp failure. On pressor support with norepi.    R femoral Temp cath placed. BCX neg to date. Abscess cx with MSSA.  Rec Source control with I and D of infected AVF as soon as possible Stop vanco- ordered Continue Ancef for MSSA AVF infection

## 2015-06-20 NOTE — Progress Notes (Signed)
Nutrition Follow-up    INTERVENTION: EN: Per Dr Sung Amabile wanting to start enteral nutrition following procedure today, MD entered adult tube feeding protocol. Recommend vital high protein at goal rate of 2ml/hr. Will provide 1080kcals, 95 gm of protein and free water,  K. Recommend adding prostat times 56for additional 100 kcals and 15 gm of protein.  Meeting 60% of protein needs and 100% of kcal needs secondary to diprivan providing 630 kcals at this time. Recommend free water of 25 q 4 hr for tube maintenance.   NUTRITION DIAGNOSIS:   Inadequate oral intake related to acute illness as evidenced by NPO status.    GOAL:   Patient will meet greater than or equal to 90% of their needs    MONITOR:    (Energy Intake, Digestive System, Anthropometrics, Electrolyte/Renal Profile, Glucose Profile)  REASON FOR ASSESSMENT:   Consult Enteral/tube feeding initiation and management  ASSESSMENT:      Pt intubated, planning trip to OR for I and D of infected fistula and HD today   Current Nutrition: NPO      Medications: senokot, lantus, aspart, diprivan providing 630 kcals (23.29ml/hr)  Electrolyte/Renal Profile and Glucose Profile:   Recent Labs Lab 06/18/15 1420 06/19/15 0622 06/20/15 0212  NA 135 137 139  K 3.1* 4.1 4.9  CL 92* 96* 101  CO2 BUN 62* 69* 45*  CREATININE 11.62* 13.12* 11.47*  CALCIUM 8.9 8.6* 7.8*  GLUCOSE 160* 193* 142*     Nutrition-Focused Physical Exam Findings:  Unable to complete Nutrition-Focused physical exam at this time.     Weight Trend since Admission: Filed Weights   06/19/15 1400 06/19/15 1845 06/19/15 2215  Weight: 438 lb 11.5 oz (199 kg) 438 lb 11.5 oz (199 kg) 438 lb 11.5 oz (199 kg)      Diet Order:   NPO  Skin:  Reviewed, no issues  Last BM:  9/12  Height:   Ht Readings from Last 1 Encounters:  06/19/15  (1.753 m)    Weight:   Wt Readings from Last 1 Encounters:  06/19/15 438 lb  11.5 oz (199 kg)    BMI:  Body mass index is 64.76 kg/(m^2).  Estimated Nutritional Needs:   Kcal:  Using IBW of 73kg (22-25 kcals/kg) 6213-0865 kcals/d.   Protein:  (2.5 g/kg) Using IBW of 73kg 183 g/d  Fluid:  plus UOP  EDUCATION NEEDS:      HIGH Care Level  Shane Hamilton, RD, LDN [phone] (pager)

## 2015-06-20 NOTE — Progress Notes (Signed)
HD tx start 

## 2015-06-20 NOTE — Progress Notes (Signed)
Pre-hd tx 

## 2015-06-20 NOTE — Care Management Note (Signed)
Case Management Note  Patient Details  Name: Shane Hamilton MRN: [id number] Date of Birth: 12-01-1965  Subjective/Objective:   Admitted to [facility], signed out AMA to come to [place]. Left AV fistula abscess. Abcess cultures positive for MSSA. Vented due to acute respiratory failure. HX: OSA, DVT, Obesity, HTN, Diabetes. Dialysis patient, Ivor Reining aware and following.    Action/Plan: OR today for I and D of abscess. RNCM to follow progress.   Expected Discharge Date:                  Expected Discharge Plan:     In-House Referral:     Discharge planning Services  CM Consult  Post Acute Care Choice:    Choice offered to:     DME Arranged:    DME Agency:     HH Arranged:    HH Agency:     Status of Service:  In process, will continue to follow  Medicare Important Message Given:  Yes-second notification given Date Medicare IM Given:    Medicare IM give by:    Date Additional Medicare IM Given:    Additional Medicare Important Message give by:     If discussed at Long Length of Stay Meetings, dates discussed:    Additional Comments:  Marily Memos, RN 06/20/2015, 11:14 AM

## 2015-06-20 NOTE — Progress Notes (Signed)
aPre-hd tx

## 2015-06-20 NOTE — Anesthesia Preprocedure Evaluation (Addendum)
Anesthesia Evaluation  Patient identified by MRN, date of birth, ID band Patient awake    Reviewed: Allergy & Precautions, H&P , NPO status , Patient's Chart, lab work & pertinent test results, reviewed documented beta blocker date and time   Airway Mallampati: II  TM Distance: >3 FB Neck ROM: full    Dental   Pulmonary sleep apnea , pneumonia,      unstable + intubated    Cardiovascular hypertension,  Rhythm:regular Rate:Normal     Neuro/Psych Seizures -,     GI/Hepatic   Endo/Other    Renal/GU Renal disease     Musculoskeletal   Abdominal   Peds  Hematology  (+) anemia ,   Anesthesia Other Findings Pt with sepsis and dm now with infected graft for emergent removal, currently on vent  Reproductive/Obstetrics negative OB ROS                           Anesthesia Physical Anesthesia Plan  ASA: IV and emergent  Anesthesia Plan: General ETT   Post-op Pain Management:    Induction:   Airway Management Planned:   Additional Equipment:   Intra-op Plan:   Post-operative Plan:   Informed Consent: I have reviewed the patients History and Physical, chart, labs and discussed the procedure including the risks, benefits and alternatives for the proposed anesthesia with the patient or authorized representative who has indicated his/her understanding and acceptance.     Plan Discussed with: CRNA  Anesthesia Plan Comments:       Anesthesia Quick Evaluation

## 2015-06-20 NOTE — Progress Notes (Signed)
Subjective:   Patient remains critically ill Now intubated, with vent support Dialysis done late last night    Objective:  Vital signs in last 24 hours:  Temp:  [98.8 F (37.1 C)-100.1 F (37.8 C)] 99.1 F (37.3 C) (09/15 0438) Pulse Rate:  [93-127] 93 (09/15 0800) Resp:  [15-30] 22 (09/15 0800) BP: (82-141)/(33-101) 82/36 mmHg (09/15 0800) SpO2:  [77 %-100 %] 100 % (09/15 0800) FiO2 (%):  [40 %-50 %] 40 % (09/15 0758) Weight:  [199 kg (438 lb 11.5 oz)] 199 kg (438 lb 11.5 oz) (09/14 2215)  Weight change: 4 kg (8 lb 13.1 oz) Filed Weights   06/19/15 1400 06/19/15 1845 06/19/15 2215  Weight: 199 kg (438 lb 11.5 oz) 199 kg (438 lb 11.5 oz) 199 kg (438 lb 11.5 oz)    Intake/Output:    Intake/Output Summary (Last 24 hours) at 06/20/15 0843 Last data filed at 06/20/15 0600  Gross per 24 hour  Intake 493.78 ml  Output    -43 ml  Net 536.78 ml     Physical Exam: General: critically ill appearing, morbidly obese  HEENT ETT in place  Neck supple  Pulm/lungs Vent assisted  CVS/Heart Regular, no rub  Abdomen:  Soft, obese, non distended  Extremities: Trace edema  Neurologic: sedated  Skin: No acute rashes  Access: Temp cath/ sep 14; infected AVF       Basic Metabolic Panel:   Recent Labs Lab 06/17/15 1330 06/18/15 0418 06/18/15 1420 06/19/15 0622 06/20/15 0212  NA 136 135 135 137 139  K 3.0* 3.3* 3.1* 4.1 4.9  CL 94* 93* 92* 96* 101  CO2 GLUCOSE 186* 184* 160* 193* 142*  BUN 34* 51* 62* 69* 45*  CREATININE 8.12* 10.68* 11.62* 13.12* 11.47*  CALCIUM 8.8* 8.8* 8.9 8.6* 7.8*     CBC:  Recent Labs Lab 06/17/15 1330 06/18/15 0418 06/18/15 1420 06/19/15 0622 06/20/15 0212  WBC 10.5 10.9* 10.8* 11.0* 10.9*  NEUTROABS 6.7  --  6.8*  --   --   HGB 11.9* 11.1* 11.2* 11.6* 10.6*  HCT 36.8* 35.6* 35.4* 36.9* 34.0*  MCV 105.1* 106.9* 105.3* 106.8* 108.0*  PLT 317 292 307 332 263      Microbiology:  Recent Results (from the past  720 hour(s))  Culture, routine-abscess     Status: None   Collection Time: 06/17/15  1:05 PM  Result Value Ref Range Status   Specimen Description ABSCESS LEFT ARM  Final   Special Requests NONE  Final   Gram Stain   Final    FEW WBC PRESENT,BOTH PMN AND MONONUCLEAR RARE SQUAMOUS EPITHELIAL CELLS PRESENT RARE GRAM POSITIVE COCCI IN PAIRS Performed at [facility]    Culture   Final    MODERATE STAPHYLOCOCCUS AUREUS Note: RIFAMPIN AND GENTAMICIN SHOULD NOT BE USED AS SINGLE DRUGS FOR TREATMENT OF STAPH INFECTIONS. Performed at [facility]    Report Status 06/20/2015 FINAL  Final   Organism ID, Bacteria STAPHYLOCOCCUS AUREUS  Final      Susceptibility   Staphylococcus aureus - MIC*    CLINDAMYCIN <=0.25 SENSITIVE Sensitive     ERYTHROMYCIN <=0.25 SENSITIVE Sensitive     GENTAMICIN <=0.5 SENSITIVE Sensitive     LEVOFLOXACIN 0.25 SENSITIVE Sensitive     OXACILLIN <=0.25 SENSITIVE Sensitive     RIFAMPIN <=0.5 SENSITIVE Sensitive     TRIMETH/SULFA <=10 SENSITIVE Sensitive     VANCOMYCIN 1 SENSITIVE Sensitive     TETRACYCLINE >=16  RESISTANT Resistant     MOXIFLOXACIN <=0.25 SENSITIVE Sensitive     * MODERATE STAPHYLOCOCCUS AUREUS  Culture, blood (routine x 2)     Status: None (Preliminary result)   Collection Time: 06/17/15  1:20 PM  Result Value Ref Range Status   Specimen Description BLOOD RIGHT ARM  Final   Special Requests BOTTLES DRAWN AEROBIC AND ANAEROBIC  Final   Culture NO GROWTH 2 DAYS  Final   Report Status PENDING  Incomplete  Culture, blood (routine x 2)     Status: None (Preliminary result)   Collection Time: 06/17/15  1:30 PM  Result Value Ref Range Status   Specimen Description BLOOD RIGHT HAND  Final   Special Requests BOTTLES DRAWN AEROBIC AND ANAEROBIC  Final   Culture NO GROWTH 2 DAYS  Final   Report Status PENDING  Incomplete  Body fluid culture     Status: None (Preliminary result)   Collection Time: 06/18/15  4:38 PM  Result  Value Ref Range Status   Specimen Description ABSCESS  Final   Special Requests NONE  Final   Gram Stain PENDING  Incomplete   Culture   Final    MODERATE GROWTH STAPHYLOCOCCUS AUREUS SUSCEPTIBILITIES TO FOLLOW    Report Status PENDING  Incomplete  MRSA PCR Screening     Status: None   Collection Time: 06/19/15  3:29 PM  Result Value Ref Range Status   MRSA by PCR NEGATIVE NEGATIVE Final    Comment:        The GeneXpert MRSA Assay (FDA approved for NASAL specimens only), is one component of a comprehensive MRSA colonization surveillance program. It is not intended to diagnose MRSA infection nor to guide or monitor treatment for MRSA infections.     Coagulation Studies:  Recent Labs  06/17/15 1330 06/17/15 2035 06/18/15 0418 06/19/15 0622  LABPROT 25.5* 23.6* 21.6* 18.6*  INR 2.36* 2.12* 1.89* 1.53    Urinalysis: No results for input(s): COLORURINE, LABSPEC, PHURINE, GLUCOSEU, HGBUR, BILIRUBINUR, KETONESUR, PROTEINUR, UROBILINOGEN, NITRITE, LEUKOCYTESUR in the last 72 hours.  Invalid input(s): APPERANCEUR    Imaging: Dg Abd 1 View  06/20/2015   CLINICAL DATA:  NG tube placement.  EXAM: ABDOMEN - 1 VIEW  COMPARISON:  None.  FINDINGS: The tip of the enteric tube is below the diaphragm in the stomach. Side port appears to be just beyond the gastroesophageal junction, however is only faintly visualized. Morbid obesity significantly limits evaluation.  IMPRESSION: Tip of the enteric tube in the stomach. Side port is likely beyond the gastroesophageal junction.   Electronically Signed   By: Rubye Oaks M.D.   On: 06/20/2015 03:30   US Venous Img Lower Bilateral  06/19/2015   CLINICAL DATA:  49 year old male with a history of swelling bilateral lower extremities  EXAM: BILATERAL LOWER EXTREMITY VENOUS DOPPLER ULTRASOUND  TECHNIQUE: Gray-scale sonography with graded compression, as well as color Doppler and duplex ultrasound were performed to evaluate the lower extremity  deep venous systems from the level of the common femoral vein and including the common femoral, femoral, profunda femoral, popliteal and calf veins including the posterior tibial, peroneal and gastrocnemius veins when visible. The superficial great saphenous vein was also interrogated. Spectral Doppler was utilized to evaluate flow at rest and with distal augmentation maneuvers in the common femoral, femoral and popliteal veins.  COMPARISON:  None.  FINDINGS: RIGHT LOWER EXTREMITY  Common Femoral Vein: No evidence of thrombus. Normal compressibility, respiratory phasicity and response to augmentation.  Saphenofemoral  Junction: No evidence of thrombus. Normal compressibility and flow on color Doppler imaging.  Profunda Femoral Vein: No evidence of thrombus. Normal compressibility and flow on color Doppler imaging.  Femoral Vein: No evidence of thrombus. Normal compressibility, respiratory phasicity and response to augmentation.  Popliteal Vein: No evidence of thrombus. Normal compressibility, respiratory phasicity and response to augmentation.  Calf Veins: Posterior tibial vein without occlusive thrombus. Peroneal veins not visualized.  Superficial Great Saphenous Vein: No evidence of thrombus. Normal compressibility and flow on color Doppler imaging.  Other Findings:  None.  LEFT LOWER EXTREMITY  Common Femoral Vein: No evidence of thrombus. Normal compressibility, respiratory phasicity and response to augmentation.  Saphenofemoral Junction: No evidence of thrombus. Normal compressibility and flow on color Doppler imaging.  Profunda Femoral Vein: No evidence of thrombus. Normal compressibility and flow on color Doppler imaging.  Femoral Vein: No evidence of thrombus. Normal compressibility, respiratory phasicity and response to augmentation.  Popliteal Vein: No evidence of thrombus. Normal compressibility, respiratory phasicity and response to augmentation.  Calf Veins: Posterior tibial vein without occlusive  thrombus. Peroneal veins not well visualized.  Superficial Great Saphenous Vein: No evidence of thrombus. Normal compressibility and flow on color Doppler imaging.  Other Findings:  None.  IMPRESSION: Sonographic survey of the bilateral lower extremities negative for DVT.  Signed,  Yvone Neu. Loreta Ave, DO  [facility]  [facility]   Electronically Signed   By: Gilmer Mor D.O.   On: 06/19/2015 12:57   Dg Chest Port 1 View  06/20/2015   CLINICAL DATA:  Endotracheal tube placement.  EXAM: PORTABLE CHEST - 1 VIEW  COMPARISON:  Earlier this day at 1833 hour  FINDINGS: Endotracheal tube is 3.4 cm from the carina. Enteric tube is in place, tip below the diaphragm. The previous left venous catheter is no longer seen. Lung volumes remain low. Pulmonary edema is unchanged. Increasing right basilar atelectasis. Cardiomediastinal contours are unchanged. No pneumothorax.  IMPRESSION: 1. Endotracheal tube 3.4 cm from the carina.  Enteric tube in place. 2. Increasing right basilar atelectasis. 3. Unchanged pulmonary edema.   Electronically Signed   By: Rubye Oaks M.D.   On: 06/20/2015 00:43   Dg Chest Port 1 View  06/19/2015   CLINICAL DATA:  Evaluate the central line placement.  The new  EXAM: PORTABLE CHEST - 1 VIEW  COMPARISON:  No radiographic 01/15/2015  FINDINGS: LEFT central venous line placed from a subclavian approach. The catheter extends cephalad into the jugular vein. No pneumothorax. Low lung volumes with venous congestion and mild pulmonary edema.  IMPRESSION: 1. Malposition of the LEFT central venous line with tip extending cephalad into the jugular vein. 2. No pneumothorax 3. Pulmonary edema not changed  These results will be called to the ordering clinician or representative by the Radiologist Assistant, and communication documented in the PACS or zVision Dashboard.   Electronically Signed   By: Genevive Bi M.D.   On: 06/19/2015 18:49      Medications:   . norepinephrine 6 mcg/min (06/20/15 1610)  . propofol (DIPRIVAN) infusion 20 mcg/kg/min (06/20/15 0530)   . allopurinol  300 mg Oral Daily  . antiseptic oral rinse  7 mL Mouth Rinse QID  . atorvastatin  10 mg Oral Daily  . calcium acetate  2,001 mg Oral TID WC  .  ceFAZolin (ANCEF) IV  1 g Intravenous Q24H  . chlorhexidine gluconate  15 mL Mouth Rinse BID  . cinacalcet  60 mg Oral Q breakfast  .  heparin subcutaneous  5,000 Units Subcutaneous Q12H  . insulin aspart  0-20 Units Subcutaneous 6 times per day  . insulin aspart protamine- aspart  35 Units Subcutaneous TID WC  . insulin glargine  70 Units Subcutaneous QHS  . mometasone-formoterol  2 puff Inhalation BID  . pantoprazole (PROTONIX) IV  40 mg Intravenous Q24H  . vancomycin  1,000 mg Intravenous Q M,W,F-HD   acetaminophen **OR** acetaminophen, alum & mag hydroxide-simeth, ondansetron **OR** ondansetron (ZOFRAN) IV, senna-docusate  Assessment/ Plan:  49 y.o. male male with a PMHX of ESRD, Morbid obesity, OSA, Chronic anticoagulation for DVT, SHPTH, was admitted on 06/18/2015 with sepsis, acute resp failure.   1. ESRD. [facility]. [place] [facility] 2. Sepsis from infected AVF. Staph aureus (MSSA) 3. AOCKD 4. SHPTH 5. Acute resp failure  Plan: ICU support for critical illness and sepsis Broad spectrum ABx as per primary team Short course of dialysis treatment today   Minimize oral meds. Hold binders/vitamins etc.   LOS: 2 Cherlynn Popiel 9/15/20168:43 AM

## 2015-06-20 NOTE — Progress Notes (Signed)
[facility]  Daily Progress Note  Subjective: Patient intubated and sedated. Right femoral temporary dialysis catheter is placed yesterday.   Objective: Filed Vitals:   06/20/15 0430 06/20/15 0438 06/20/15 0500 06/20/15 0600  BP: 92/68  82/47 89/53  Pulse:   94 95  Temp:  99.1 F (37.3 C)    TempSrc:  Oral    Resp:   22 22  Height:      Weight:      SpO2:   100% 100%    Intake/Output Summary (Last 24 hours) at 06/20/15 0841 Last data filed at 06/20/15 0600  Gross per 24 hour  Intake 493.78 ml  Output    -43 ml  Net 536.78 ml    Physical Exam: Intubated and sedated CV: RRR Pulmonary: Intubated Abdomen: Soft, Nontender, Nondistended Groin: Catheter in place - no drainage, no signs of infection Extremity: No vascular compromise to left hand, fistula with abscess    Laboratory: CBC    Component Value Date/Time   WBC 10.9* 06/20/2015 0212   WBC 7.3 04/24/2014 1434   HGB 10.6* 06/20/2015 0212   HGB 8.5* 04/24/2014 1434   HCT 34.0* 06/20/2015 0212   HCT 26.9* 04/24/2014 1434   PLT 263 06/20/2015 0212   PLT 309 04/24/2014 1434    BMET    Component Value Date/Time   NA 139 06/20/2015 0212   K 4.9 06/20/2015 0212   K 3.6 04/24/2014 1434   CL 101 06/20/2015 0212   CO2 25 06/20/2015 0212   GLUCOSE 142* 06/20/2015 0212   BUN 45* 06/20/2015 0212   CREATININE 11.47* 06/20/2015 0212   CALCIUM 7.8* 06/20/2015 0212   GFRNONAA 5* 06/20/2015 0212   GFRAA 5* 06/20/2015 0212    Assessment/Planning: 49 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 PA-C 06/20/2015 8:41 AM

## 2015-06-20 NOTE — Progress Notes (Signed)
ANTIBIOTIC CONSULT NOTE - INITIAL  Pharmacy Consult for Cefazolin Indication: fistula abscess  Allergies  Allergen Reactions  . Aspirin Other (See Comments)    Reaction:  GI upset     Patient Measurements: Height: 5\' 9"  (175.3 cm) Weight: (!) 438 lb 11.5 oz (199 kg) IBW/kg (Calculated) : 70.7 Adjusted Body Weight: 123 kg  Vital Signs: Temp: 100 F (37.8 C) (09/15 1200) Temp Source: Axillary (09/15 1200) BP: 107/50 mmHg (09/15 1315) Pulse Rate: 90 (09/15 1315) Intake/Output from previous day: 09/14 0701 - 09/15 0700 In: 520.1 [I.V.:270.1; IV Piggyback:250] Out: -43  Intake/Output from this shift: Total I/O In: 144.8 [I.V.:144.8] Out: -   Labs:  Recent Labs  06/18/15 1420 06/19/15 0622 06/20/15 0212  WBC 10.8* 11.0* 10.9*  HGB 11.2* 11.6* 10.6*  PLT 307 332 263  CREATININE 11.62* 13.12* 11.47*   Estimated Creatinine Clearance: 13.6 mL/min (by C-G formula based on Cr of 11.47). No results for input(s): VANCOTROUGH, VANCOPEAK, VANCORANDOM, GENTTROUGH, GENTPEAK, GENTRANDOM, TOBRATROUGH, TOBRAPEAK, TOBRARND, AMIKACINPEAK, AMIKACINTROU, AMIKACIN in the last 72 hours.   Microbiology: Recent Results (from the past 720 hour(s))  Culture, routine-abscess     Status: None   Collection Time: 06/17/15  1:05 PM  Result Value Ref Range Status   Specimen Description ABSCESS LEFT ARM  Final   Special Requests NONE  Final   Gram Stain   Final    FEW WBC PRESENT,BOTH PMN AND MONONUCLEAR RARE SQUAMOUS EPITHELIAL CELLS PRESENT RARE GRAM POSITIVE COCCI IN PAIRS Performed at [facility]    Culture   Final    MODERATE STAPHYLOCOCCUS AUREUS Note: RIFAMPIN AND GENTAMICIN SHOULD NOT BE USED AS SINGLE DRUGS FOR TREATMENT OF STAPH INFECTIONS. Performed at [facility]    Report Status 06/20/2015 FINAL  Final   Organism ID, Bacteria STAPHYLOCOCCUS AUREUS  Final      Susceptibility   Staphylococcus aureus - MIC*    CLINDAMYCIN <=0.25 SENSITIVE Sensitive      ERYTHROMYCIN <=0.25 SENSITIVE Sensitive     GENTAMICIN <=0.5 SENSITIVE Sensitive     LEVOFLOXACIN 0.25 SENSITIVE Sensitive     OXACILLIN <=0.25 SENSITIVE Sensitive     RIFAMPIN <=0.5 SENSITIVE Sensitive     TRIMETH/SULFA <=10 SENSITIVE Sensitive     VANCOMYCIN 1 SENSITIVE Sensitive     TETRACYCLINE >=16 RESISTANT Resistant     MOXIFLOXACIN <=0.25 SENSITIVE Sensitive     * MODERATE STAPHYLOCOCCUS AUREUS  Culture, blood (routine x 2)     Status: None (Preliminary result)   Collection Time: 06/17/15  1:20 PM  Result Value Ref Range Status   Specimen Description BLOOD RIGHT ARM  Final   Special Requests BOTTLES DRAWN AEROBIC AND ANAEROBIC  Final   Culture NO GROWTH 2 DAYS  Final   Report Status PENDING  Incomplete  Culture, blood (routine x 2)     Status: None (Preliminary result)   Collection Time: 06/17/15  1:30 PM  Result Value Ref Range Status   Specimen Description BLOOD RIGHT HAND  Final   Special Requests BOTTLES DRAWN AEROBIC AND ANAEROBIC  Final   Culture NO GROWTH 2 DAYS  Final   Report Status PENDING  Incomplete  Body fluid culture     Status: None (Preliminary result)   Collection Time: 06/18/15  4:38 PM  Result Value Ref Range Status   Specimen Description ABSCESS  Final   Special Requests NONE  Final   Gram Stain PENDING  Incomplete   Culture MODERATE GROWTH STAPHYLOCOCCUS AUREUS  Final  Report Status PENDING  Incomplete   Organism ID, Bacteria STAPHYLOCOCCUS AUREUS  Final      Susceptibility   Staphylococcus aureus - MIC*    CIPROFLOXACIN <=0.5 SENSITIVE Sensitive     GENTAMICIN <=0.5 SENSITIVE Sensitive     OXACILLIN 0.5 SENSITIVE Sensitive     TRIMETH/SULFA <=10 SENSITIVE Sensitive     CEFOXITIN SCREEN NEGATIVE Sensitive     Inducible Clindamycin NEGATIVE Sensitive     TETRACYCLINE Value in next row Resistant      RESISTANT>=16    * MODERATE GROWTH STAPHYLOCOCCUS AUREUS  MRSA PCR Screening     Status: None   Collection Time: 06/19/15  3:29 PM   Result Value Ref Range Status   MRSA by PCR NEGATIVE NEGATIVE Final    Comment:        The GeneXpert MRSA Assay (FDA approved for NASAL specimens only), is one component of a comprehensive MRSA colonization surveillance program. It is not intended to diagnose MRSA infection nor to guide or monitor treatment for MRSA infections.   Culture, blood (routine x 2)     Status: None (Preliminary result)   Collection Time: 06/19/15  4:00 PM  Result Value Ref Range Status   Specimen Description BLOOD RIGHT ARM  Final   Special Requests BOTTLES DRAWN AEROBIC AND ANAEROBIC  2CC  Final   Culture NO GROWTH < 24 HOURS  Final   Report Status PENDING  Incomplete  Culture, blood (routine x 2)     Status: None (Preliminary result)   Collection Time: 06/19/15  4:00 PM  Result Value Ref Range Status   Specimen Description BLOOD RIGHT ARM  Final   Special Requests BOTTLES DRAWN AEROBIC AND ANAEROBIC  2CC  Final   Culture NO GROWTH < 24 HOURS  Final   Report Status PENDING  Incomplete    Medical History: Past Medical History  Diagnosis Date  . Type 2 diabetes mellitus, uncontrolled        . Morbid obesity   . OSA (obstructive sleep apnea)   . Septic shock(785.52)   . Anemia   . Hypertension   . Seizures   . Gout   . ESRD (end stage renal disease)   . LOC (loss of consciousness)   . DVT (deep venous thrombosis) 2011  . Cardiomegaly   . Chronic anticoagulation   . Chronic pancreatitis   . Secondary hyperparathyroidism     Medications:  Prescriptions prior to admission  Medication Sig Dispense Refill Last Dose  . allopurinol (ZYLOPRIM) 300 MG tablet Take 300 mg by mouth 2 (two) times daily.     . cinacalcet (SENSIPAR) 60 MG tablet Take 60 mg by mouth daily.   06/17/2015 at Unknown time  . clonazePAM (KLONOPIN) 1 MG tablet Take 1 mg by mouth 2 (two) times daily.   06/17/2015 at Unknown time  . colchicine 0.6 MG tablet Take 0.6 mg by mouth daily.   06/17/2015 at Unknown time  . gabapentin  (NEURONTIN) 300 MG capsule Take 300 mg by mouth every 8 (eight) hours.   06/17/2015 at Unknown time  . hydrOXYzine (ATARAX/VISTARIL) 25 MG tablet Take 25 mg by mouth 3 (three) times daily as needed.   06/17/2015 at Unknown time  . insulin aspart protamine-insulin aspart (NOVOLOG 70/30) (70-30) 100 UNIT/ML injection Inject 35 Units into the skin 3 (three) times daily with meals.    06/17/2015 at Unknown time  . insulin glargine (LANTUS) 100 UNIT/ML injection Inject 70 Units into the skin at bedtime.  06/16/2015 at Unknown time  . omeprazole (PRILOSEC) 20 MG capsule Take 20 mg by mouth 2 (two) times daily before a meal.    06/17/2015 at Unknown time  . Oxycodone HCl 10 MG TABS Take 10 mg by mouth every 6 (six) hours as needed (pain).    Past Week at Unknown time  . warfarin (COUMADIN) 7.5 MG tablet Take 7.5 mg by mouth See admin instructions. Pt takes 7.5mg  on Tuesday, Wednesday, Thursday, Saturday and Sunday and 10mg  on Monday and Friday   06/17/2015 at Unknown time  . amLODipine (NORVASC) 5 MG tablet Take 5 mg by mouth daily.   Not Taking at Unknown time  . atorvastatin (LIPITOR) 10 MG tablet Take 10 mg by mouth daily.   Not Taking at Unknown time  . calcium acetate (PHOSLO) 667 MG capsule Take 2,001 mg by mouth 3 (three) times daily with meals.    Not Taking at Unknown time  . lanthanum (FOSRENOL) 1000 MG chewable tablet Chew 1,000 mg by mouth 2 (two) times daily with a meal.   Not Taking at Unknown time  . multivitamin (RENA-VIT) TABS tablet Take 1 tablet by mouth daily.   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 Abscess cx growing MSSA so vancomycin D/C.   Plan:  Will continue cefazolin 1 g iv q 24 hours.   Luisa Hart D 06/20/2015,1:33 PM

## 2015-06-21 ENCOUNTER — Inpatient Hospital Stay: Payer: Medicare Other

## 2015-06-21 ENCOUNTER — Encounter: Payer: Self-pay | Admitting: Vascular Surgery

## 2015-06-21 DIAGNOSIS — G4733 Obstructive sleep apnea (adult) (pediatric): Secondary | ICD-10-CM

## 2015-06-21 DIAGNOSIS — E662 Morbid (severe) obesity with alveolar hypoventilation: Secondary | ICD-10-CM

## 2015-06-21 LAB — GLUCOSE, CAPILLARY
GLUCOSE-CAPILLARY: 125 mg/dL — AB (ref 65–99)
Glucose-Capillary: 114 mg/dL — ABNORMAL HIGH (ref 65–99)
Glucose-Capillary: 117 mg/dL — ABNORMAL HIGH (ref 65–99)
Glucose-Capillary: 124 mg/dL — ABNORMAL HIGH (ref 65–99)
Glucose-Capillary: 131 mg/dL — ABNORMAL HIGH (ref 65–99)
Glucose-Capillary: 132 mg/dL — ABNORMAL HIGH (ref 65–99)
Glucose-Capillary: 134 mg/dL — ABNORMAL HIGH (ref 65–99)
Glucose-Capillary: 134 mg/dL — ABNORMAL HIGH (ref 65–99)

## 2015-06-21 LAB — BASIC METABOLIC PANEL
ANION GAP: 12 (ref 5–15)
BUN: 31 mg/dL — ABNORMAL HIGH (ref 6–20)
CALCIUM: 8.6 mg/dL — AB (ref 8.9–10.3)
CHLORIDE: 97 mmol/L — AB (ref 101–111)
CO2: 29 mmol/L (ref 22–32)
Creatinine, Ser: 9.32 mg/dL — ABNORMAL HIGH (ref 0.61–1.24)
GFR calc non Af Amer: 6 mL/min — ABNORMAL LOW (ref >60)
GFR, EST AFRICAN AMERICAN: 7 mL/min — AB (ref >60)
GLUCOSE: 132 mg/dL — AB (ref 65–99)
POTASSIUM: 3.7 mmol/L (ref 3.5–5.1)
Sodium: 138 mmol/L (ref 135–145)

## 2015-06-21 LAB — CBC
HEMATOCRIT: 30.1 % — AB (ref 40.0–52.0)
HEMOGLOBIN: 9.5 g/dL — AB (ref 13.0–18.0)
MCH: 33.7 pg (ref 26.0–34.0)
MCHC: 31.6 g/dL — AB (ref 32.0–36.0)
MCV: 106.9 fL — ABNORMAL HIGH (ref 80.0–100.0)
Platelets: 308 10*3/uL (ref 150–440)
RBC: 2.82 MIL/uL — AB (ref 4.40–5.90)
RDW: 17.5 % — ABNORMAL HIGH (ref 11.5–14.5)
WBC: 11 10*3/uL — ABNORMAL HIGH (ref 3.8–10.6)

## 2015-06-21 LAB — HEPATITIS B SURFACE ANTIBODY, QUANTITATIVE: HEPATITIS B-POST: 28.7 m[IU]/mL

## 2015-06-21 LAB — HEPATITIS B SURFACE ANTIGEN: HEP B S AG: NEGATIVE

## 2015-06-21 MED ORDER — PANTOPRAZOLE SODIUM 40 MG PO TBEC: 40.0000 mg | DELAYED_RELEASE_TABLET | Freq: Every day | ORAL | Status: DC

## 2015-06-21 MED ORDER — INSULIN ASPART 100 UNIT/ML ~~LOC~~ SOLN: 0.0000 [IU] | Freq: Every day | SUBCUTANEOUS | Status: DC

## 2015-06-21 MED ORDER — PANTOPRAZOLE SODIUM 40 MG PO TBEC
40.0000 mg | DELAYED_RELEASE_TABLET | Freq: Two times a day (BID) | ORAL | Status: DC
  Administered 2015-06-21 – 2015-06-26 (×7): 40 mg via ORAL
  Filled 2015-06-21 (×7): qty 1

## 2015-06-21 MED ORDER — INSULIN ASPART 100 UNIT/ML ~~LOC~~ SOLN
0.0000 [IU] | Freq: Three times a day (TID) | SUBCUTANEOUS | Status: DC
  Administered 2015-06-21 – 2015-06-24 (×3): 3 [IU] via SUBCUTANEOUS
  Administered 2015-06-24: 4 [IU] via SUBCUTANEOUS
  Administered 2015-06-25 – 2015-06-26 (×2): 3 [IU] via SUBCUTANEOUS
  Filled 2015-06-21 (×2): qty 3
  Filled 2015-06-21: qty 4
  Filled 2015-06-21 (×3): qty 3

## 2015-06-21 MED ORDER — ALLOPURINOL 100 MG PO TABS
100.0000 mg | ORAL_TABLET | ORAL | Status: DC
  Administered 2015-06-23: 100 mg via ORAL
  Filled 2015-06-21: qty 1

## 2015-06-21 NOTE — Plan of Care (Signed)
Problem: Acute Rehab PT Goals(only PT should resolve) Goal: Pt Will Go Supine/Side To Sit Pt will demonstrate ModI bed mobility supine to sitting edge-of-bed, rolling bilat, and scooting toward HOB, to return to PLOF and to decrease caregiver burden.     Goal: Patient Will Transfer Sit To/From Stand Pt will transfer sit to/from-stand with LRAD at ModI without loss-of-balance to demonstrate good safety awareness for independent mobility in home.     Goal: Pt Will Ambulate Pt will ambulate with LRAD at ModI using a step-through pattern and equal step length for a distances greater than 145ft to demonstrate the ability to perform safe household distance ambulation at discharge.

## 2015-06-21 NOTE — Evaluation (Signed)
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 Pt came to [facility] c infection of his dialysis catheter in his LUE. Pt has since had that removed, and insertion of temp catheter in R femoral access. First attempt at placement of new catheter was not successful.   Clinical Impression  Pt presenting with generalized weakness in BUE, BLE, and fatigue related to current medical state. Unable to assess out of bed at this time due to temp catheter. Pt reporting poor tolerance to activity, generally sore, and generally deconditioned due to prolonged bed rest. Will require short stay in SNF for rehab to return to PLOF in household ambulation, ADL, and general mobility.     Follow Up Recommendations SNF    Equipment Recommendations  None recommended by PT    Recommendations for Other Services       Precautions / Restrictions Precautions Precaution Comments: Pt with Temp Fem Cath (RLE), limiting OOB assessment with PT per hospital policy.  Restrictions Weight Bearing Restrictions: No      Mobility  Bed Mobility Overal bed mobility: +2 for physical assistance             General bed mobility comments: Pt on strict bedrest with PT until removal of Temp femoral catherter per hospital policy. Pt is morbidly obese and very weak due to prolonged bed rest.   Transfers                    Ambulation/Gait                Stairs            Wheelchair Mobility    Modified Rankin (Stroke Patients Only)       Balance                                             Pertinent Vitals/Pain Pain Assessment: No/denies pain (pt reports eh is sore all over, does nto further explain. )    Home Living Family/patient expects to be discharged to:: Private  residence Living Arrangements: Spouse/significant other Available Help at Discharge: Family;Friend(s) Type of Home: Apartment Home Access: Stairs to enter   [employer] of Steps: 1 step  Home Layout: One level Home Equipment: Walker - 4 wheels;Wheelchair - manual Additional Comments: Pt has requessted power scooter but has ot been able to get one. CurrentWC was acquired, not clear as to whether it is fit to patient's large body and weight.     Prior Function Level of Independence: Needs assistance   Gait / Transfers Assistance Needed: ModI   ADL's / Homemaking Assistance Needed: ModI, help at home from wife   Comments: household ambulation only, which was quite limited.      Hand Dominance   Dominant Hand: Right    Extremity/Trunk Assessment   Upper Extremity Assessment: Generalized weakness           Lower Extremity Assessment: Generalized weakness      Cervical / Trunk Assessment:  (unable to asess at this time. )  Communication   Communication: No difficulties  Cognition Arousal/Alertness: Awake/alert Behavior During Therapy: WFL for tasks assessed/performed Overall Cognitive Status: Within  Functional Limits for tasks assessed                      General Comments      Exercises General Exercises - Lower Extremity Ankle Circles/Pumps: AROM;Both;15 reps;Supine Long Arc Quad: Strengthening;Right;10 reps;Other (comment);Supine (manually resisted. ) Heel Slides: Left;10 reps;Supine;AAROM Other Exercises Other Exercises: Manually resisted chest press: 1x10 bilat Other Exercises: manually resisted R elbow flexion/extension: 1x10 Other Exercises: L elbow flexion/extension PROM: 1x10       Assessment/Plan    PT Assessment Patient needs continued PT services  PT Diagnosis Generalized weakness   PT Problem List Decreased strength;Decreased activity tolerance;Decreased mobility;Obesity  PT Treatment Interventions Gait training;Functional  mobility training;Stair training;Therapeutic activities;Therapeutic exercise;Balance training;Patient/family education   PT Goals (Current goals can be found in the Care Plan section) Acute Rehab PT Goals Patient Stated Goal: Return to home.  PT Goal Formulation: With patient Time For Goal Achievement: 07/05/15 Potential to Achieve Goals: Fair    Frequency Min 2X/week   Barriers to discharge Other (comment) Large habitus, generalized weakness.     Co-evaluation               End of Session   Activity Tolerance: Patient limited by fatigue;Patient limited by lethargy;Patient tolerated treatment well Patient left: in bed;with nursing/sitter in room;with call bell/phone within reach Nurse Communication: Other (comment)         Time: 0981-1914 PT Time Calculation (min) (ACUTE ONLY): 24 min   Charges:   PT Evaluation $Initial PT Evaluation Tier I: 1 Procedure PT Treatments $Therapeutic Exercise: 8-22 mins   PT G Codes:        Buccola,Allan C Jul 11, 2015, 4:10 PM  4:12 PM  Rosamaria Lints, PT, DPT [place] License # [id number]

## 2015-06-21 NOTE — Progress Notes (Signed)
Nutrition Follow-up    INTERVENTION:   Coordination of Care: await diet progression post extubation; Britney RN to obtain new weight  NUTRITION DIAGNOSIS:   Inadequate oral intake related to acute illness as evidenced by NPO status. Continues  GOAL:   Patient will meet greater than or equal to 90% of their needs  MONITOR:    (Energy Intake, Digestive System, Anthropometrics, Electrolyte/Renal Profile, Glucose Profile)  ASSESSMENT:    Pt s/p extubation this AM  Diet Order:  Diet heart healthy/carb modified Room service appropriate?: Yes; Fluid consistency:: Thinno order  EN: TF started this AM but discontinued with extubation  Skin:  Reviewed, no issues  Last BM:  9/12   Electrolyte and Renal Profile:  Recent Labs Lab 06/19/15 0622 06/20/15 0212 06/21/15 0548  BUN 69* 45* 31*  CREATININE 13.12* 11.47* 9.32*  NA 137 139 138  K 4.1 4.9 3.7   Glucose Profile:   Recent Labs  06/21/15 0407 06/21/15 0725 06/21/15 1044  GLUCAP 125* 131* 134*   Meds: levophed, lantus   Height:   Ht Readings from Last 1 Encounters:  06/21/15  (1.626 m)    Weight:   Wt Readings from Last 1 Encounters:  06/21/15 217 lb 2.5 oz (98.5 kg)    Ideal Body Weight:     BMI:  Body mass index is 37.26 kg/(m^2).  Estimated Nutritional Needs:   Kcal:  2190-2555 kcals   Protein:  88-110 g (1.2-1.5 g/kg) using IBW 73 kg  Fluid:  plus UOP  HIGH Care Level  Romelle Starcher MS, RD, LDN [phone] Pager

## 2015-06-21 NOTE — Progress Notes (Signed)
ANTIBIOTIC CONSULT NOTE - INITIAL  Pharmacy Consult for Cefazolin Indication: fistula abscess  Allergies  Allergen Reactions  . Aspirin Other (See Comments)    Reaction:  GI upset     Patient Measurements: Height: 5\' 4"  (162.6 cm) Weight: 217 lb 2.5 oz (98.5 kg) IBW/kg (Calculated) : 59.2 Adjusted Body Weight: 123 kg  Vital Signs: Temp: 99.1 F (37.3 C) (09/16 1300) Temp Source: Oral (09/16 1300) BP: 117/66 mmHg (09/16 1500) Pulse Rate: 99 (09/16 1500) Intake/Output from previous day: 09/15 0701 - 09/16 0700 In: 2178.4 [I.V.:2128.4; IV Piggyback:50] Out: 400 [Blood:400] Intake/Output from this shift: Total I/O In: 68.3 [I.V.:68.3] Out: -   Labs:  Recent Labs  06/19/15 0622 06/20/15 0212 06/21/15 0548  WBC 11.0* 10.9* 11.0*  HGB 11.6* 10.6* 9.5*  PLT 332 263 308  CREATININE 13.12* 11.47* 9.32*   Estimated Creatinine Clearance: 10.3 mL/min (by C-G formula based on Cr of 9.32). No results for input(s): VANCOTROUGH, VANCOPEAK, VANCORANDOM, GENTTROUGH, GENTPEAK, GENTRANDOM, TOBRATROUGH, TOBRAPEAK, TOBRARND, AMIKACINPEAK, AMIKACINTROU, AMIKACIN in the last 72 hours.   Microbiology: Recent Results (from the past 720 hour(s))  Culture, routine-abscess     Status: None   Collection Time: 06/17/15  1:05 PM  Result Value Ref Range Status   Specimen Description ABSCESS LEFT ARM  Final   Special Requests NONE  Final   Gram Stain   Final    FEW WBC PRESENT,BOTH PMN AND MONONUCLEAR RARE SQUAMOUS EPITHELIAL CELLS PRESENT RARE GRAM POSITIVE COCCI IN PAIRS Performed at [facility]    Culture   Final    MODERATE STAPHYLOCOCCUS AUREUS Note: RIFAMPIN AND GENTAMICIN SHOULD NOT BE USED AS SINGLE DRUGS FOR TREATMENT OF STAPH INFECTIONS. Performed at [facility]    Report Status 06/20/2015 FINAL  Final   Organism ID, Bacteria STAPHYLOCOCCUS AUREUS  Final      Susceptibility   Staphylococcus aureus - MIC*    CLINDAMYCIN <=0.25 SENSITIVE Sensitive      ERYTHROMYCIN <=0.25 SENSITIVE Sensitive     GENTAMICIN <=0.5 SENSITIVE Sensitive     LEVOFLOXACIN 0.25 SENSITIVE Sensitive     OXACILLIN <=0.25 SENSITIVE Sensitive     RIFAMPIN <=0.5 SENSITIVE Sensitive     TRIMETH/SULFA <=10 SENSITIVE Sensitive     VANCOMYCIN 1 SENSITIVE Sensitive     TETRACYCLINE >=16 RESISTANT Resistant     MOXIFLOXACIN <=0.25 SENSITIVE Sensitive     * MODERATE STAPHYLOCOCCUS AUREUS  Culture, blood (routine x 2)     Status: None (Preliminary result)   Collection Time: 06/17/15  1:20 PM  Result Value Ref Range Status   Specimen Description BLOOD RIGHT ARM  Final   Special Requests BOTTLES DRAWN AEROBIC AND ANAEROBIC  Final   Culture NO GROWTH 4 DAYS  Final   Report Status PENDING  Incomplete  Culture, blood (routine x 2)     Status: None (Preliminary result)   Collection Time: 06/17/15  1:30 PM  Result Value Ref Range Status   Specimen Description BLOOD RIGHT HAND  Final   Special Requests BOTTLES DRAWN AEROBIC AND ANAEROBIC  Final   Culture NO GROWTH 4 DAYS  Final   Report Status PENDING  Incomplete  Body fluid culture     Status: None (Preliminary result)   Collection Time: 06/18/15  4:38 PM  Result Value Ref Range Status   Specimen Description ABSCESS  Final   Special Requests NONE  Final   Gram Stain   Final    TOO NUMEROUS TO COUNT WBC SEEN MODERATE  GRAM POSITIVE COCCI FEW GRAM NEGATIVE RODS    Culture MODERATE GROWTH STAPHYLOCOCCUS AUREUS  Final   Report Status PENDING  Incomplete   Organism ID, Bacteria STAPHYLOCOCCUS AUREUS  Final      Susceptibility   Staphylococcus aureus - MIC*    CIPROFLOXACIN <=0.5 SENSITIVE Sensitive     GENTAMICIN <=0.5 SENSITIVE Sensitive     OXACILLIN 0.5 SENSITIVE Sensitive     TRIMETH/SULFA <=10 SENSITIVE Sensitive     CEFOXITIN SCREEN NEGATIVE Sensitive     Inducible Clindamycin NEGATIVE Sensitive     TETRACYCLINE Value in next row Resistant      RESISTANT>=16    * MODERATE GROWTH STAPHYLOCOCCUS AUREUS   MRSA PCR Screening     Status: None   Collection Time: 06/19/15  3:29 PM  Result Value Ref Range Status   MRSA by PCR NEGATIVE NEGATIVE Final    Comment:        The GeneXpert MRSA Assay (FDA approved for NASAL specimens only), is one component of a comprehensive MRSA colonization surveillance program. It is not intended to diagnose MRSA infection nor to guide or monitor treatment for MRSA infections.   Culture, blood (routine x 2)     Status: None (Preliminary result)   Collection Time: 06/19/15  4:00 PM  Result Value Ref Range Status   Specimen Description BLOOD RIGHT ARM  Final   Special Requests BOTTLES DRAWN AEROBIC AND ANAEROBIC  2CC  Final   Culture NO GROWTH 2 DAYS  Final   Report Status PENDING  Incomplete  Culture, blood (routine x 2)     Status: None (Preliminary result)   Collection Time: 06/19/15  4:00 PM  Result Value Ref Range Status   Specimen Description BLOOD RIGHT ARM  Final   Special Requests BOTTLES DRAWN AEROBIC AND ANAEROBIC  2CC  Final   Culture NO GROWTH 2 DAYS  Final   Report Status PENDING  Incomplete    Medical History: Past Medical History  Diagnosis Date  . Type 2 diabetes mellitus, uncontrolled        . Morbid obesity   . OSA (obstructive sleep apnea)   . Septic shock(785.52)   . Anemia   . Hypertension   . Seizures   . Gout   . ESRD (end stage renal disease)   . LOC (loss of consciousness)   . DVT (deep venous thrombosis) 2011  . Cardiomegaly   . Chronic anticoagulation   . Chronic pancreatitis   . Secondary hyperparathyroidism     Medications:  Prescriptions prior to admission  Medication Sig Dispense Refill Last Dose  . allopurinol (ZYLOPRIM) 300 MG tablet Take 300 mg by mouth 2 (two) times daily.     . cinacalcet (SENSIPAR) 60 MG tablet Take 60 mg by mouth daily.   06/17/2015 at Unknown time  . clonazePAM (KLONOPIN) 1 MG tablet Take 1 mg by mouth 2 (two) times daily.   06/17/2015 at Unknown time  . colchicine 0.6 MG tablet  Take 0.6 mg by mouth daily.   06/17/2015 at Unknown time  . gabapentin (NEURONTIN) 300 MG capsule Take 300 mg by mouth every 8 (eight) hours.   06/17/2015 at Unknown time  . hydrOXYzine (ATARAX/VISTARIL) 25 MG tablet Take 25 mg by mouth 3 (three) times daily as needed.   06/17/2015 at Unknown time  . insulin aspart protamine-insulin aspart (NOVOLOG 70/30) (70-30) 100 UNIT/ML injection Inject 35 Units into the skin 3 (three) times daily with meals.    06/17/2015 at Unknown time  .  insulin glargine (LANTUS) 100 UNIT/ML injection Inject 70 Units into the skin at bedtime.    06/16/2015 at Unknown time  . omeprazole (PRILOSEC) 20 MG capsule Take 20 mg by mouth 2 (two) times daily before a meal.    06/17/2015 at Unknown time  . Oxycodone HCl 10 MG TABS Take 10 mg by mouth every 6 (six) hours as needed (pain).    Past Week at Unknown time  . warfarin (COUMADIN) 7.5 MG tablet Take 7.5 mg by mouth See admin instructions. Pt takes 7.5mg  on Tuesday, Wednesday, Thursday, Saturday and Sunday and  on Monday and Friday   06/17/2015 at Unknown time  . amLODipine (NORVASC) 5 MG tablet Take 5 mg by mouth daily.   Not Taking at Unknown time  . atorvastatin (LIPITOR) 10 MG tablet Take 10 mg by mouth daily.   Not Taking at Unknown time  . calcium acetate (PHOSLO) 667 MG capsule Take 2,001 mg by mouth 3 (three) times daily with meals.    Not Taking at Unknown time  . lanthanum (FOSRENOL) 1000 MG chewable tablet Chew 1,000 mg by mouth 2 (two) times daily with a meal.   Not Taking at Unknown time  . multivitamin (RENA-VIT) TABS tablet Take 1 tablet by mouth daily.   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 Abscess cx growing MSSA so vancomycin D/C.   Plan:  Will continue cefazolin 1 g iv q 24 hours.   Shane Hamilton D 06/21/2015,3:35 PM

## 2015-06-21 NOTE — Progress Notes (Signed)
Subjective:   Patient remains critically ill Sedation is being weaned off this morning. Patient is able to follow commands Dialysis done yesterday Remains on small dose of Levophed Underwent excision of infected left arm basilic AV fistula with multiple previously placed stents by Dr. Gilda Crease  on September 15    Objective:  Vital signs in last 24 hours:  Temp:  [98.3 F (36.8 C)-101 F (38.3 C)] 99.9 F (37.7 C) (09/16 0506) Pulse Rate:  [80-113] 100 (09/16 0600) Resp:  [15-22] 15 (09/16 0600) BP: (84-134)/(47-76) 95/58 mmHg (09/16 0600) SpO2:  [97 %-100 %] 100 % (09/16 0600) FiO2 (%):  [40 %] 40 % (09/16 0757) Weight:  [98.5 kg (217 lb 2.5 oz)-200.1 kg (441 lb 2.3 oz)] 98.5 kg (217 lb 2.5 oz) (09/16 0000)  Weight change: 1.1 kg (2 lb 6.8 oz) Filed Weights   06/20/15 1215 06/20/15 1524 06/21/15 0000  Weight: 200.1 kg (441 lb 2.3 oz) 200.1 kg (441 lb 2.3 oz) 98.5 kg (217 lb 2.5 oz)    Intake/Output:    Intake/Output Summary (Last 24 hours) at 06/21/15 0920 Last data filed at 06/21/15 0600  Gross per 24 hour  Intake 2007.15 ml  Output    400 ml  Net 1607.15 ml     Physical Exam: General: critically ill appearing, morbidly obese  HEENT ETT in place  Neck supple  Pulm/lungs Vent assisted, minimal crackles,   CVS/Heart Regular, no rub  Abdomen:  Soft, obese, non distended  Extremities: Trace edema  Neurologic: sedated  Skin: No acute rashes  Access: Temp cath/ sep 14; infected AVF       Basic Metabolic Panel:   Recent Labs Lab 06/18/15 0418 06/18/15 1420 06/19/15 0622 06/20/15 0212 06/21/15 0548  NA 135 135 137 139 138  K 3.3* 3.1* 4.1 4.9 3.7  CL 93* 92* 96* 101 97*  CO2 27 27 24 25 29   GLUCOSE 184* 160* 193* 142* 132*  BUN 51* 62* 69* 45* 31*  CREATININE 10.68* 11.62* 13.12* 11.47* 9.32*  CALCIUM 8.8* 8.9 8.6* 7.8* 8.6*     CBC:  Recent Labs Lab 06/17/15 1330 06/18/15 0418 06/18/15 1420 06/19/15 0622 06/20/15 0212 06/21/15 0548   WBC 10.5 10.9* 10.8* 11.0* 10.9* 11.0*  NEUTROABS 6.7  --  6.8*  --   --   --   HGB 11.9* 11.1* 11.2* 11.6* 10.6* 9.5*  HCT 36.8* 35.6* 35.4* 36.9* 34.0* 30.1*  MCV 105.1* 106.9* 105.3* 106.8* 108.0* 106.9*  PLT 317 292 307 332 263 308      Microbiology:  Recent Results (from the past 720 hour(s))  Culture, routine-abscess     Status: None   Collection Time: 06/17/15  1:05 PM  Result Value Ref Range Status   Specimen Description ABSCESS LEFT ARM  Final   Special Requests NONE  Final   Gram Stain   Final    FEW WBC PRESENT,BOTH PMN AND MONONUCLEAR RARE SQUAMOUS EPITHELIAL CELLS PRESENT RARE GRAM POSITIVE COCCI IN PAIRS Performed at [facility]    Culture   Final    MODERATE STAPHYLOCOCCUS AUREUS Note: RIFAMPIN AND GENTAMICIN SHOULD NOT BE USED AS SINGLE DRUGS FOR TREATMENT OF STAPH INFECTIONS. Performed at [facility]    Report Status 06/20/2015 FINAL  Final   Organism ID, Bacteria STAPHYLOCOCCUS AUREUS  Final      Susceptibility   Staphylococcus aureus - MIC*    CLINDAMYCIN <=0.25 SENSITIVE Sensitive     ERYTHROMYCIN <=0.25 SENSITIVE Sensitive  GENTAMICIN <=0.5 SENSITIVE Sensitive     LEVOFLOXACIN 0.25 SENSITIVE Sensitive     OXACILLIN <=0.25 SENSITIVE Sensitive     RIFAMPIN <=0.5 SENSITIVE Sensitive     TRIMETH/SULFA <=10 SENSITIVE Sensitive     VANCOMYCIN 1 SENSITIVE Sensitive     TETRACYCLINE >=16 RESISTANT Resistant     MOXIFLOXACIN <=0.25 SENSITIVE Sensitive     * MODERATE STAPHYLOCOCCUS AUREUS  Culture, blood (routine x 2)     Status: None (Preliminary result)   Collection Time: 06/17/15  1:20 PM  Result Value Ref Range Status   Specimen Description BLOOD RIGHT ARM  Final   Special Requests BOTTLES DRAWN AEROBIC AND ANAEROBIC  Final   Culture NO GROWTH 3 DAYS  Final   Report Status PENDING  Incomplete  Culture, blood (routine x 2)     Status: None (Preliminary result)   Collection Time: 06/17/15  1:30 PM  Result Value Ref Range  Status   Specimen Description BLOOD RIGHT HAND  Final   Special Requests BOTTLES DRAWN AEROBIC AND ANAEROBIC  Final   Culture NO GROWTH 3 DAYS  Final   Report Status PENDING  Incomplete  Body fluid culture     Status: None (Preliminary result)   Collection Time: 06/18/15  4:38 PM  Result Value Ref Range Status   Specimen Description ABSCESS  Final   Special Requests NONE  Final   Gram Stain   Final    TOO NUMEROUS TO COUNT WBC SEEN MODERATE GRAM POSITIVE COCCI FEW GRAM NEGATIVE RODS    Culture MODERATE GROWTH STAPHYLOCOCCUS AUREUS  Final   Report Status PENDING  Incomplete   Organism ID, Bacteria STAPHYLOCOCCUS AUREUS  Final      Susceptibility   Staphylococcus aureus - MIC*    CIPROFLOXACIN <=0.5 SENSITIVE Sensitive     GENTAMICIN <=0.5 SENSITIVE Sensitive     OXACILLIN 0.5 SENSITIVE Sensitive     TRIMETH/SULFA <=10 SENSITIVE Sensitive     CEFOXITIN SCREEN NEGATIVE Sensitive     Inducible Clindamycin NEGATIVE Sensitive     TETRACYCLINE Value in next row Resistant      RESISTANT>=16    * MODERATE GROWTH STAPHYLOCOCCUS AUREUS  MRSA PCR Screening     Status: None   Collection Time: 06/19/15  3:29 PM  Result Value Ref Range Status   MRSA by PCR NEGATIVE NEGATIVE Final    Comment:        The GeneXpert MRSA Assay (FDA approved for NASAL specimens only), is one component of a comprehensive MRSA colonization surveillance program. It is not intended to diagnose MRSA infection nor to guide or monitor treatment for MRSA infections.   Culture, blood (routine x 2)     Status: None (Preliminary result)   Collection Time: 06/19/15  4:00 PM  Result Value Ref Range Status   Specimen Description BLOOD RIGHT ARM  Final   Special Requests BOTTLES DRAWN AEROBIC AND ANAEROBIC  2CC  Final   Culture NO GROWTH 2 DAYS  Final   Report Status PENDING  Incomplete  Culture, blood (routine x 2)     Status: None (Preliminary result)   Collection Time: 06/19/15  4:00 PM  Result Value Ref  Range Status   Specimen Description BLOOD RIGHT ARM  Final   Special Requests BOTTLES DRAWN AEROBIC AND ANAEROBIC  2CC  Final   Culture NO GROWTH 2 DAYS  Final   Report Status PENDING  Incomplete    Coagulation Studies:  Recent Labs  06/19/15 0622  LABPROT 18.6*  INR 1.53    Urinalysis: No results for input(s): COLORURINE, LABSPEC, PHURINE, GLUCOSEU, HGBUR, BILIRUBINUR, KETONESUR, PROTEINUR, UROBILINOGEN, NITRITE, LEUKOCYTESUR in the last 72 hours.  Invalid input(s): APPERANCEUR    Imaging: Dg Abd 1 View  06/20/2015   CLINICAL DATA:  NG tube placement.  EXAM: ABDOMEN - 1 VIEW  COMPARISON:  None.  FINDINGS: The tip of the enteric tube is below the diaphragm in the stomach. Side port appears to be just beyond the gastroesophageal junction, however is only faintly visualized. Morbid obesity significantly limits evaluation.  IMPRESSION: Tip of the enteric tube in the stomach. Side port is likely beyond the gastroesophageal junction.   Electronically Signed   By: Rubye Oaks M.D.   On: 06/20/2015 03:30   US Venous Img Lower Bilateral  06/19/2015   CLINICAL DATA:  49 year old male with a history of swelling bilateral lower extremities  EXAM: BILATERAL LOWER EXTREMITY VENOUS DOPPLER ULTRASOUND  TECHNIQUE: Gray-scale sonography with graded compression, as well as color Doppler and duplex ultrasound were performed to evaluate the lower extremity deep venous systems from the level of the common femoral vein and including the common femoral, femoral, profunda femoral, popliteal and calf veins including the posterior tibial, peroneal and gastrocnemius veins when visible. The superficial great saphenous vein was also interrogated. Spectral Doppler was utilized to evaluate flow at rest and with distal augmentation maneuvers in the common femoral, femoral and popliteal veins.  COMPARISON:  None.  FINDINGS: RIGHT LOWER EXTREMITY  Common Femoral Vein: No evidence of thrombus. Normal compressibility,  respiratory phasicity and response to augmentation.  Saphenofemoral Junction: No evidence of thrombus. Normal compressibility and flow on color Doppler imaging.  Profunda Femoral Vein: No evidence of thrombus. Normal compressibility and flow on color Doppler imaging.  Femoral Vein: No evidence of thrombus. Normal compressibility, respiratory phasicity and response to augmentation.  Popliteal Vein: No evidence of thrombus. Normal compressibility, respiratory phasicity and response to augmentation.  Calf Veins: Posterior tibial vein without occlusive thrombus. Peroneal veins not visualized.  Superficial Great Saphenous Vein: No evidence of thrombus. Normal compressibility and flow on color Doppler imaging.  Other Findings:  None.  LEFT LOWER EXTREMITY  Common Femoral Vein: No evidence of thrombus. Normal compressibility, respiratory phasicity and response to augmentation.  Saphenofemoral Junction: No evidence of thrombus. Normal compressibility and flow on color Doppler imaging.  Profunda Femoral Vein: No evidence of thrombus. Normal compressibility and flow on color Doppler imaging.  Femoral Vein: No evidence of thrombus. Normal compressibility, respiratory phasicity and response to augmentation.  Popliteal Vein: No evidence of thrombus. Normal compressibility, respiratory phasicity and response to augmentation.  Calf Veins: Posterior tibial vein without occlusive thrombus. Peroneal veins not well visualized.  Superficial Great Saphenous Vein: No evidence of thrombus. Normal compressibility and flow on color Doppler imaging.  Other Findings:  None.  IMPRESSION: Sonographic survey of the bilateral lower extremities negative for DVT.  Signed,  Yvone Neu. Loreta Ave, DO  [facility]  [facility]   Electronically Signed   By: Gilmer Mor D.O.   On: 06/19/2015 12:57   Dg Chest Port 1 View  06/21/2015   CLINICAL DATA:  Respiratory failure.  EXAM: PORTABLE CHEST - 1 VIEW   COMPARISON:  06/19/2015.  FINDINGS: Endotracheal tube and NG tube in stable position. Cardiomegaly with pulmonary vascular prominence and bilateral interstitial prominence consistent congestive heart failure noted. Low lung volumes with bibasilar atelectasis . Small bilateral pleural effusions cannot be excluded. No pneumothorax .  IMPRESSION: 1. Lines  and tubes stable position. 2. Persistent cardiomegaly with bilateral pulmonary alveolar infiltrates consistent with congestive heart failure and pulmonary edema. Bilateral pleural effusions cannot be excluded. 3. Low lung volumes with basilar atelectasis.   Electronically Signed   By: Maisie Fus  Register   On: 06/21/2015 07:22   Dg Chest Port 1 View  06/20/2015   CLINICAL DATA:  Endotracheal tube placement.  EXAM: PORTABLE CHEST - 1 VIEW  COMPARISON:  Earlier this day at 1833 hour  FINDINGS: Endotracheal tube is 3.4 cm from the carina. Enteric tube is in place, tip below the diaphragm. The previous left venous catheter is no longer seen. Lung volumes remain low. Pulmonary edema is unchanged. Increasing right basilar atelectasis. Cardiomediastinal contours are unchanged. No pneumothorax.  IMPRESSION: 1. Endotracheal tube 3.4 cm from the carina.  Enteric tube in place. 2. Increasing right basilar atelectasis. 3. Unchanged pulmonary edema.   Electronically Signed   By: Rubye Oaks M.D.   On: 06/20/2015 00:43   Dg Chest Port 1 View  06/19/2015   CLINICAL DATA:  Evaluate the central line placement.  The new  EXAM: PORTABLE CHEST - 1 VIEW  COMPARISON:  No radiographic 01/15/2015  FINDINGS: LEFT central venous line placed from a subclavian approach. The catheter extends cephalad into the jugular vein. No pneumothorax. Low lung volumes with venous congestion and mild pulmonary edema.  IMPRESSION: 1. Malposition of the LEFT central venous line with tip extending cephalad into the jugular vein. 2. No pneumothorax 3. Pulmonary edema not changed  These results will be  called to the ordering clinician or representative by the Radiologist Assistant, and communication documented in the PACS or zVision Dashboard.   Electronically Signed   By: Genevive Bi M.D.   On: 06/19/2015 18:49     Medications:   . norepinephrine 6 mcg/min (06/21/15 0316)  . propofol (DIPRIVAN) infusion 15 mcg/kg/min (06/21/15 0834)   . allopurinol  300 mg Per Tube Daily  . antiseptic oral rinse  7 mL Mouth Rinse QID  . atorvastatin  10 mg Per Tube Daily  .  ceFAZolin (ANCEF) IV  1 g Intravenous Q24H  . chlorhexidine gluconate  15 mL Mouth Rinse BID  . feeding supplement (PRO-STAT SUGAR FREE 64)  30 mL Oral Daily  . feeding supplement (VITAL HIGH PROTEIN)  1,000 mL Per Tube Q24H  . free water  25 mL Per Tube Q4H  . heparin subcutaneous  5,000 Units Subcutaneous 3 times per day  . insulin aspart  0-20 Units Subcutaneous 6 times per day  . insulin glargine  35 Units Subcutaneous QHS  . pantoprazole (PROTONIX) IV  40 mg Intravenous Q24H   acetaminophen **OR** acetaminophen, ondansetron **OR** ondansetron (ZOFRAN) IV, senna-docusate  Assessment/ Plan:  49 y.o. male male with a PMHX of ESRD, Morbid obesity, OSA, Chronic anticoagulation for DVT, SHPTH, was admitted on 06/18/2015 with sepsis, acute resp failure. Excision of infected left arm basilic AV fistula with multiple previously placed stents sep 15  1. ESRD. [facility] [place] [facility] 2. Sepsis from infected AVF. Staph aureus (MSSA). S/p Excision of infected left arm basilic AV fistula with multiple previously placed stents. SEP 15 3. [place]OCKD 4. SHPTH 5. Acute resp failure  Plan: ICU support for critical illness and sepsis Broad spectrum ABx as per primary team We'll plan on dialysis tomorrow  Minimize oral meds. Hold binders/vitamins etc.   LOS: 3 SINGH,HARMEET 9/16/20169:20 AM

## 2015-06-21 NOTE — OR Nursing (Signed)
Patient postop recovery in ICU as patient is to remain intubated and ventilated.

## 2015-06-21 NOTE — Progress Notes (Signed)
PULMONARY / CRITICAL CARE MEDICINE   Name: Shane Hamilton MRN: [id number] DOB: Sep 02, 1966    ADMISSION DATE:  06/18/2015  INITIAL PRESENTATION:    MAJOR EVENTS/TEST RESULTS: 9/14 LE venous Dopplers >> No DVT 9/14 Vasc surgery consultation. Surgical drainage of LUE abscess planned for 9/15 9/14 Renal consultation. HD performed 9/14 PM: intubated. Hypotension post procedure. Norepinephrine gtt initiated 9/15 HD performed 9/15 Excision of infected RUE AVF 9/16 Extubated. Tolerating initially. Off vasopressors  INDWELLING DEVICES:: ETT 9/14 >> 9/16 R femoral HD cath 9/14 >>   MICRO DATA: LUE abscess 9/13 >> MSSA Blood 9/14 >>   ANTIMICROBIALS:  Vanc 9/13 >> 9/14 Cefazolin 9/14 >>    VITAL SIGNS: Temp:  [98.3 F (36.8 C)-101 F (38.3 C)] 99.1 F (37.3 C) (09/16 1300) Pulse Rate:  [80-113] 99 (09/16 1500) Resp:  [15-22] 16 (09/16 1500) BP: (84-134)/(47-76) 117/66 mmHg (09/16 1500) SpO2:  [92 %-100 %] 99 % (09/16 1500) FiO2 (%):  [40 %] 40 % (09/16 0757) Weight:  [98.5 kg (217 lb 2.5 oz)] 98.5 kg (217 lb 2.5 oz) (09/16 0000) HEMODYNAMICS:   VENTILATOR SETTINGS: Vent Mode:  [-] PRVC FiO2 (%):  [40 %] 40 % Set Rate:  [16 bmp] 16 bmp Vt Set:  [500 mL] 500 mL PEEP:  [5 cmH20-8 cmH20] 5 cmH20 INTAKE / OUTPUT:  Intake/Output Summary (Last 24 hours) at 06/21/15 1554 Last data filed at 06/21/15 0915  Gross per 24 hour  Intake 1698.85 ml  Output    400 ml  Net 1298.85 ml    PHYSICAL EXAMINATION: General: Severely obese, RASS0, + F/C  Neuro: PERRL, EOMI, MAEs HEENT: plethoric, NCAT Cardiovascular: tachy, regular, no M Lungs: no adventitious sounds anteriorly Abdomen: very obese, distant BS, nontender Ext: chronic stasis changes, trace symmetric edema, missing 1st and 2nd digits on RUE, diminished R radial pulse  LABS:  CBC  Recent Labs Lab 06/19/15 0622 06/20/15 0212 06/21/15 0548  WBC 11.0* 10.9* 11.0*  HGB 11.6* 10.6* 9.5*  HCT 36.9* 34.0* 30.1*   PLT 332 263 308   Coag's  Recent Labs Lab 06/17/15 2035 06/18/15 0418 06/19/15 0622  INR 2.12* 1.89* 1.53   BMET  Recent Labs Lab 06/19/15 0622 06/20/15 0212 06/21/15 0548  NA 137 139 138  K 4.1 4.9 3.7  CL 96* 101 97*  CO2 BUN 69* 45* 31*  CREATININE 13.12* 11.47* 9.32*  GLUCOSE 193* 142* 132*   Electrolytes  Recent Labs Lab 06/19/15 0622 06/20/15 0212 06/21/15 0548  CALCIUM 8.6* 7.8* 8.6*   Sepsis Markers  Recent Labs Lab 06/17/15 1355  LATICACIDVEN 1.55   ABG  Recent Labs Lab 06/19/15 1310 06/19/15 1540 06/20/15 0030  PHART 7.14* 7.26* 7.27*  PCO2ART 84* 59* 58*  PO2ART 35* 41* 63*   Liver Enzymes No results for input(s): AST, ALT, ALKPHOS, BILITOT, ALBUMIN in the last 168 hours. Cardiac Enzymes No results for input(s): TROPONINI, PROBNP in the last 168 hours. Glucose  Recent Labs Lab 06/20/15 1943 06/21/15 0039 06/21/15 0407 06/21/15 0725 06/21/15 1044 06/21/15 1331  GLUCAP 126* 114* 125* 131* 134* 134*    CXR: CM, edema pattern    ASSESSMENT / PLAN:  PULMONARY A: Acute on chronic hypercarbic resp failure Decompensated OHS/OSA Pulm edema P:   Cont full vent support - settings reviewed and/or adjusted Cont vent bundle Daily SBT if/when meets criteria Mandatory nocturnal BiPAP for severe OSA  CARDIOVASCULAR A:  Hypotension - likely related to sedation P:  MAP goal >  60 mmHg  RENAL A:   ESRD P:   Monitor BMET intermittently Monitor I/Os Correct electrolytes as indicated Further HD planned 9/16  GASTROINTESTINAL A:   Chronic PPI use P:   SUP: PO PPI Advance diet as tolerated  HEMATOLOGIC A:  Anemia without overt blood loss P:  DVT px: SQ heparin Monitor CBC intermittently Transfuse as needed per usual guidelines   INFECTIOUS A:  LUE AVF abscess - MSSA P:   Monitor temp, WBC count Micro and abx as above ID service following  ENDOCRINE A:   DM2, presently controlled Risk of  hypoglycemia - NPO, renal failure P:   Cont Lantus Cont SSI - changed to ACHS 9/16  NEUROLOGIC A:   Acute encephalopathy, resolved P:   RASS goal: 0 Avoid sedating meds Daily WUA   FAMILY: Wife and pt updated @ bedside   CCM time: 35 mins The above time includes time spent in consultation with patient and/or family members and reviewing care plan on multidisciplinary rounds  Billy Fischer, MD PCCM service Mobile [phone] Pager [phone]  06/21/2015, 3:54 PM

## 2015-06-21 NOTE — Progress Notes (Signed)
[facility] CLINIC INFECTIOUS DISEASE PROGRESS NOTE Date of Admission:  06/18/2015     ID: Shane Hamilton is a 49 y.o. male with MSSA LUE AVF infeciton nActive Problems:   AV fistula infection   Arteriovenous fistula infection   Subjective: S.p resection of fistula 9/15 Still on pressors, bp improving, no longer intubated  ROS  Eleven systems are reviewed and negative except per hpi  Medications:  Antibiotics Given (last 72 hours)    Date/Time Action Medication Dose Rate   06/18/15 2144 Given   vancomycin (VANCOCIN) 2,000 mg in sodium chloride 0.9 % 500 mL IVPB 2,000 mg 250 mL/hr   06/18/15 2144 Given   piperacillin-tazobactam (ZOSYN) IVPB 3.375 g 3.375 g 12.5 mL/hr   06/19/15 0942 Given   piperacillin-tazobactam (ZOSYN) IVPB 3.375 g 3.375 g 12.5 mL/hr   06/19/15 1534 Given   vancomycin (VANCOCIN) IVPB 1000 mg/200 mL premix 1,000 mg 200 mL/hr   06/19/15 1534 Given   ceFAZolin (ANCEF) IVPB 1 g/50 mL premix 1 g 100 mL/hr   06/20/15 1555 Given  [pt in dialysis]   ceFAZolin (ANCEF) IVPB 1 g/50 mL premix 1 g 100 mL/hr     . [START ON 06/23/2015] allopurinol  100 mg Oral Q48H  . antiseptic oral rinse  7 mL Mouth Rinse QID  . atorvastatin  10 mg Per Tube Daily  .  ceFAZolin (ANCEF) IV  1 g Intravenous Q24H  . chlorhexidine gluconate  15 mL Mouth Rinse BID  . heparin subcutaneous  5,000 Units Subcutaneous 3 times per day  . insulin aspart  0-20 Units Subcutaneous TID WC  . insulin aspart  0-5 Units Subcutaneous QHS  . insulin glargine  35 Units Subcutaneous QHS  . pantoprazole (PROTONIX) IV  40 mg Intravenous Q24H    Objective: Vital signs in last 24 hours: Temp:  [98.3 F (36.8 C)-101 F (38.3 C)] 99.1 F (37.3 C) (09/16 1300) Pulse Rate:  [80-113] 102 (09/16 1400) Resp:  [15-22] 22 (09/16 1400) BP: (84-134)/(47-76) 120/68 mmHg (09/16 1400) SpO2:  [92 %-100 %] 92 % (09/16 1400) FiO2 (%):  [40 %] 40 % (09/16 0757) Weight:  [98.5 kg (217 lb 2.5 oz)-200.1 kg (441 lb 2.3  oz)] 98.5 kg (217 lb 2.5 oz) (09/16 0000) Constitutional: more alert,  HENT:  Mouth/Throat op clear Cardiovascular: Normal rate, regular rhythm distant hs Pulmonary/Chest: coarse rhonchi bil Abdominal: Soft. Obese Bowel sounds are normal. He exhibits no distension. There is no tenderness.  Lymphadenopathy: He has no cervical adenopathy.  Neurological: obtunded,  Skin: LUE with wound vac in place Psychiatric: obtunded EXT 2+ edema bil le  Lab Results  Recent Labs  06/20/15 0212 06/21/15 0548  WBC 10.9* 11.0*  HGB 10.6* 9.5*  HCT 34.0* 30.1*  NA 139 138  K 4.9 3.7  CL 101 97*  CO2 25 29  BUN 45* 31*  CREATININE 11.47* 9.32*    Microbiology: Results for orders placed or performed during the hospital encounter of 06/18/15  Body fluid culture     Status: None (Preliminary result)   Collection Time: 06/18/15  4:38 PM  Result Value Ref Range Status   Specimen Description ABSCESS  Final   Special Requests NONE  Final   Gram Stain   Final    TOO NUMEROUS TO COUNT WBC SEEN MODERATE GRAM POSITIVE COCCI FEW GRAM NEGATIVE RODS    Culture MODERATE GROWTH STAPHYLOCOCCUS AUREUS  Final   Report Status PENDING  Incomplete   Organism ID, Bacteria STAPHYLOCOCCUS AUREUS  Final  Susceptibility   Staphylococcus aureus - MIC*    CIPROFLOXACIN <=0.5 SENSITIVE Sensitive     GENTAMICIN <=0.5 SENSITIVE Sensitive     OXACILLIN 0.5 SENSITIVE Sensitive     TRIMETH/SULFA <=10 SENSITIVE Sensitive     CEFOXITIN SCREEN NEGATIVE Sensitive     Inducible Clindamycin NEGATIVE Sensitive     TETRACYCLINE Value in next row Resistant      RESISTANT>=16    * MODERATE GROWTH STAPHYLOCOCCUS AUREUS  MRSA PCR Screening     Status: None   Collection Time: 06/19/15  3:29 PM  Result Value Ref Range Status   MRSA by PCR NEGATIVE NEGATIVE Final    Comment:        The GeneXpert MRSA Assay (FDA approved for NASAL specimens only), is one component of a comprehensive MRSA colonization surveillance  program. It is not intended to diagnose MRSA infection nor to guide or monitor treatment for MRSA infections.   Culture, blood (routine x 2)     Status: None (Preliminary result)   Collection Time: 06/19/15  4:00 PM  Result Value Ref Range Status   Specimen Description BLOOD RIGHT ARM  Final   Special Requests BOTTLES DRAWN AEROBIC AND ANAEROBIC  2CC  Final   Culture NO GROWTH 2 DAYS  Final   Report Status PENDING  Incomplete  Culture, blood (routine x 2)     Status: None (Preliminary result)   Collection Time: 06/19/15  4:00 PM  Result Value Ref Range Status   Specimen Description BLOOD RIGHT ARM  Final   Special Requests BOTTLES DRAWN AEROBIC AND ANAEROBIC  2CC  Final   Culture NO GROWTH 2 DAYS  Final   Report Status PENDING  Incomplete    Studies/Results: Dg Abd 1 View  06/20/2015   CLINICAL DATA:  NG tube placement.  EXAM: ABDOMEN - 1 VIEW  COMPARISON:  None.  FINDINGS: The tip of the enteric tube is below the diaphragm in the stomach. Side port appears to be just beyond the gastroesophageal junction, however is only faintly visualized. Morbid obesity significantly limits evaluation.  IMPRESSION: Tip of the enteric tube in the stomach. Side port is likely beyond the gastroesophageal junction.   Electronically Signed   By: Rubye Oaks M.D.   On: 06/20/2015 03:30   Dg Chest Port 1 View  06/21/2015   CLINICAL DATA:  Respiratory failure.  EXAM: PORTABLE CHEST - 1 VIEW  COMPARISON:  06/19/2015.  FINDINGS: Endotracheal tube and NG tube in stable position. Cardiomegaly with pulmonary vascular prominence and bilateral interstitial prominence consistent congestive heart failure noted. Low lung volumes with bibasilar atelectasis . Small bilateral pleural effusions cannot be excluded. No pneumothorax .  IMPRESSION: 1. Lines and tubes stable position. 2. Persistent cardiomegaly with bilateral pulmonary alveolar infiltrates consistent with congestive heart failure and pulmonary edema.  Bilateral pleural effusions cannot be excluded. 3. Low lung volumes with basilar atelectasis.   Electronically Signed   By: Maisie Fus  Register   On: 06/21/2015 07:22   Dg Chest Port 1 View  06/20/2015   CLINICAL DATA:  Endotracheal tube placement.  EXAM: PORTABLE CHEST - 1 VIEW  COMPARISON:  Earlier this day at 1833 hour  FINDINGS: Endotracheal tube is 3.4 cm from the carina. Enteric tube is in place, tip below the diaphragm. The previous left venous catheter is no longer seen. Lung volumes remain low. Pulmonary edema is unchanged. Increasing right basilar atelectasis. Cardiomediastinal contours are unchanged. No pneumothorax.  IMPRESSION: 1. Endotracheal tube 3.4 cm from the carina.  Enteric tube in place. 2. Increasing right basilar atelectasis. 3. Unchanged pulmonary edema.   Electronically Signed   By: Rubye Oaks M.D.   On: 06/20/2015 00:43   Dg Chest Port 1 View  06/19/2015   CLINICAL DATA:  Evaluate the central line placement.  The new  EXAM: PORTABLE CHEST - 1 VIEW  COMPARISON:  No radiographic 01/15/2015  FINDINGS: LEFT central venous line placed from a subclavian approach. The catheter extends cephalad into the jugular vein. No pneumothorax. Low lung volumes with venous congestion and mild pulmonary edema.  IMPRESSION: 1. Malposition of the LEFT central venous line with tip extending cephalad into the jugular vein. 2. No pneumothorax 3. Pulmonary edema not changed  These results will be called to the ordering clinician or representative by the Radiologist Assistant, and communication documented in the PACS or zVision Dashboard.   Electronically Signed   By: Genevive Bi M.D.   On: 06/19/2015 18:49    Assessment/Plan: Shane Hamilton is a 49 y.o. 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 Had stents placed in fistula in July. Was critically ill, had AVF and stents removed 9/15- wound vac in place BCX negative.    Recommendations Continue wound vac per AVVS Would rec 4 - 6 week course of IV ancef at HD  I can see in 4 weeks time to decide on extending IV abx, changing to oral keflex or stopping based on how it looks Thank you very much for the consult. Will follow with you.  FITZGERALD, DAVID   06/21/2015, 2:42 PM

## 2015-06-21 NOTE — Progress Notes (Signed)
eLink Physician-Brief Progress Note Patient Name: Shane Hamilton DOB: 1965/10/20 MRN: [id number]   Date of Service  06/21/2015  HPI/Events of Note  Patient requesting home medication prilosec.  eICU Interventions  Protonix 40 mg on MAR.  Ordered dose for tonight.     Intervention Category Minor Interventions: Routine modifications to care plan (e.g. PRN medications for pain, fever)  DETERDING,ELIZABETH 06/21/2015, 9:49 PM

## 2015-06-21 NOTE — Progress Notes (Addendum)
[facility]  Daily Progress Note   Subjective: 1 Day Post-Op: Excision of infected left arm basilic AV fistula with multiple previously placed stents, I&D of wound abscess and VAC dressing placement.  2 Days Post-Op: Insertion of temporary dialysis catheter catheter right femoral approach.  Patient extubated, asking when he can leave hospital. States arm is "sore."  Objective: Filed Vitals:   06/21/15 1130 06/21/15 1200 06/21/15 1300 06/21/15 1400  BP: 119/74 117/73 114/69 120/68  Pulse: 109 102 102 102  Temp:   99.1 F (37.3 C)   TempSrc:   Oral   Resp: Height:      Weight:      SpO2: 98% 100% 99% 92%    Intake/Output Summary (Last 24 hours) at 06/21/15 1457 Last data filed at 06/21/15 0915  Gross per 24 hour  Intake 1881.79 ml  Output    400 ml  Net 1481.79 ml   Physical Exam: A&Ox3, NAD CV: RRR Pulmonary: CTA Bilaterally Left Upper Extremity: VAC in place to suction, Incisions: clean dry and intact, minimal swelling, minimal erythema, pulses intact. Abdomen: Soft, Nontender, Nondistended Right Groin: Catheter intact, no draining or signs infection, working appropriately   Laboratory: CBC    Component Value Date/Time   WBC 11.0* 06/21/2015 0548   WBC 7.3 04/24/2014 1434   HGB 9.5* 06/21/2015 0548   HGB 8.5* 04/24/2014 1434   HCT 30.1* 06/21/2015 0548   HCT 26.9* 04/24/2014 1434   PLT 308 06/21/2015 0548   PLT 309 04/24/2014 1434   BMET    Component Value Date/Time   NA 138 06/21/2015 0548   K 3.7 06/21/2015 0548   K 3.6 04/24/2014 1434   CL 97* 06/21/2015 0548   CO2 29 06/21/2015 0548   GLUCOSE 132* 06/21/2015 0548   BUN 31* 06/21/2015 0548   CREATININE 9.32* 06/21/2015 0548   CALCIUM 8.6* 06/21/2015 0548   GFRNONAA 6* 06/21/2015 0548   GFRAA 7* 06/21/2015 0548   Assessment/Planning: 49 year old male s/p excision of infected left arm basilic AV fistula with multiple previously placed stents, I&D of wound abscess  and VAC dressing placement. 1) Next VAC change Sunday 2) Continue to use temp dialysis catheter for dialysis 3) OK for PT with temp dialysis catheter 4) Care as per primary team  [facility] PA-C 06/21/2015 2:57 PM

## 2015-06-21 NOTE — Progress Notes (Signed)
[facility] [place] [facility]   PATIENT NAME: Shane Hamilton    MR#:  [id number]  DATE OF BIRTH:  05-03-1966  SUBJECTIVE:  CHIEF COMPLAINT:   Chief Complaint  Patient presents with  . infection of fistula    - Patient doing well and extubated this morning. Currently on 2 L nasal cannula. -Wife at bedside. Patient completely alert and oriented -Infected AV fistula excised  yesterday  REVIEW OF SYSTEMS:  Review of Systems  Constitutional: Negative for fever and chills.  HENT: Negative for ear discharge, ear pain and nosebleeds.   Respiratory: Negative for cough, shortness of breath and wheezing.   Cardiovascular: Negative for chest pain and palpitations.  Gastrointestinal: Negative for nausea, vomiting, abdominal pain, diarrhea and constipation.  Genitourinary: Negative for dysuria and urgency.  Neurological: Negative for dizziness, sensory change, speech change, focal weakness, seizures and headaches.  Psychiatric/Behavioral: Negative for depression.    DRUG ALLERGIES:   Allergies  Allergen Reactions  . Aspirin Other (See Comments)    Reaction:  GI upset     VITALS:  Blood pressure 117/66, pulse 99, temperature 99.1 F (37.3 C), temperature source Oral, resp. rate 16, height 5\' 4"  (1.626 m), weight 98.5 kg (217 lb 2.5 oz), SpO2 99 %.  PHYSICAL EXAMINATION:  Physical Exam  GENERAL:  49 y.o.-year-old morbidly obese patient sitting in the bed EYES: Pupils equal, round, reactive to light and accommodation. Proptosis of the eyes noted. No scleral icterus.  extraocular movements intact  HEENT: Head atraumatic, normocephalic. Oropharynx and nasopharynx clear.  NECK:  Neck is short and thick. Supple, no jugular venous distention. No thyroid enlargement, no tenderness.  LUNGS: Decreased bilateral breath sounds, no wheezes or crackles heard.. No use of accessory muscles of respiration. CARDIOVASCULAR: S1, S2 normal. No murmurs, rubs, or gallops.   ABDOMEN: Obese abdomen. Soft, nontender, nondistended. Bowel sounds present. No organomegaly or mass.  EXTREMITIES: 1+ pedal edema present, no cyanosis, or clubbing.  Right hand first 2 digits indicated traumatic contracture injury. Left arm AV fistula excision  done yesterday. Dressing in place NEUROLOGIC:  Cranial nerves intact, no focal motor or sensory deficits. Able to move all extremities and sensation intact PSYCHIATRIC: The patient is alert and oriented 3  SKIN: No obvious rash, lesion, or ulcer.    LABORATORY PANEL:   CBC  Recent Labs Lab 06/21/15 0548  WBC 11.0*  HGB 9.5*  HCT 30.1*  PLT 308   ------------------------------------------------------------------------------------------------------------------  Chemistries   Recent Labs Lab 06/21/15 0548  NA 138  K 3.7  CL 97*  CO2 29  GLUCOSE 132*  BUN 31*  CREATININE 9.32*  CALCIUM 8.6*   ------------------------------------------------------------------------------------------------------------------  Cardiac Enzymes No results for input(s): TROPONINI in the last 168 hours. ------------------------------------------------------------------------------------------------------------------  RADIOLOGY:  Dg Abd 1 View  06/20/2015   CLINICAL DATA:  NG tube placement.  EXAM: ABDOMEN - 1 VIEW  COMPARISON:  None.  FINDINGS: The tip of the enteric tube is below the diaphragm in the stomach. Side port appears to be just beyond the gastroesophageal junction, however is only faintly visualized. Morbid obesity significantly limits evaluation.  IMPRESSION: Tip of the enteric tube in the stomach. Side port is likely beyond the gastroesophageal junction.   Electronically Signed   By: Rubye Oaks M.D.   On: 06/20/2015 03:30   Dg Chest Port 1 View  06/21/2015   CLINICAL DATA:  Respiratory failure.  EXAM: PORTABLE CHEST - 1 VIEW  COMPARISON:  06/19/2015.  FINDINGS: Endotracheal tube and  NG tube in stable position.  Cardiomegaly with pulmonary vascular prominence and bilateral interstitial prominence consistent congestive heart failure noted. Low lung volumes with bibasilar atelectasis . Small bilateral pleural effusions cannot be excluded. No pneumothorax .  IMPRESSION: 1. Lines and tubes stable position. 2. Persistent cardiomegaly with bilateral pulmonary alveolar infiltrates consistent with congestive heart failure and pulmonary edema. Bilateral pleural effusions cannot be excluded. 3. Low lung volumes with basilar atelectasis.   Electronically Signed   By: Maisie Fus  Register   On: 06/21/2015 07:22   Dg Chest Port 1 View  06/20/2015   CLINICAL DATA:  Endotracheal tube placement.  EXAM: PORTABLE CHEST - 1 VIEW  COMPARISON:  Earlier this day at 1833 hour  FINDINGS: Endotracheal tube is 3.4 cm from the carina. Enteric tube is in place, tip below the diaphragm. The previous left venous catheter is no longer seen. Lung volumes remain low. Pulmonary edema is unchanged. Increasing right basilar atelectasis. Cardiomediastinal contours are unchanged. No pneumothorax.  IMPRESSION: 1. Endotracheal tube 3.4 cm from the carina.  Enteric tube in place. 2. Increasing right basilar atelectasis. 3. Unchanged pulmonary edema.   Electronically Signed   By: Rubye Oaks M.D.   On: 06/20/2015 00:43   Dg Chest Port 1 View  06/19/2015   CLINICAL DATA:  Evaluate the central line placement.  The new  EXAM: PORTABLE CHEST - 1 VIEW  COMPARISON:  No radiographic 01/15/2015  FINDINGS: LEFT central venous line placed from a subclavian approach. The catheter extends cephalad into the jugular vein. No pneumothorax. Low lung volumes with venous congestion and mild pulmonary edema.  IMPRESSION: 1. Malposition of the LEFT central venous line with tip extending cephalad into the jugular vein. 2. No pneumothorax 3. Pulmonary edema not changed  These results will be called to the ordering clinician or representative by the Radiologist Assistant, and  communication documented in the PACS or zVision Dashboard.   Electronically Signed   By: Genevive Bi M.D.   On: 06/19/2015 18:49    EKG:  No orders found for this or any previous visit.  ASSESSMENT AND PLAN:   49 year old morbidly obese male with past medical history significant for sleep apnea, end-stage renal disease, diabetes mellitus and chronic DVT presents secondary to AV fistula abscess. Also went into acute hypercarbic hypoxic respiratory failure.  #1 Acute respiratory failure-hypoxic and hypercarbic respiratory failure - Failed BiPAP and had to be intubated. Appreciate  pulmonary consult. -doing well, extubated this morning. Back on 2 L oxygen which is his chronic home O2   #2 Sepsis and AV fistula infection/abscess-blood cultures from [facility] from 06/17/2015 are negative so far -Wound cultures by aspirating his fistula on admission growing MSSA - appreciate ID consult,  on ancef for 4-6 weeks with hemodialysis -AV fistula excised 06/20/15  #3 End-stage renal disease-on 4 times a week dialysis. -On Monday, Tuesday, Thursday and Saturday schedule.  -, AV fistula infection with MSSA abscess - temp dialysis catheter placed, s/p excision of AV fistula 06/20/15  - patient will need a permacath prior to discharge- discussed with Dr. Wyn Quaker, likely placement Monday -Appreciate vascular and nephrology consults  #4 hypotension-post intubation and while on sedation. -Held blood pressure medications for now. Levophed drip- still on , now off of sedation - continue to wean off levophed. - also sepsis from AV graft infection  #5 diabetes mellitus-Lantus dose is decreased to half as patient is nothing by mouth and SLIDING SCALE INSULIN.  #6 DVT prophylaxis-subcutaneous heparin  #7 GI prophylaxis-on  Protonix  All the records are reviewed and case discussed with Care Management/Social Workerr. Management plans discussed with the patient, family and they are in  agreement.  CODE STATUS: Full code  TOTAL  TIME SPENT IN TAKING CARE OF THIS PATIENT: 38 minutes.   POSSIBLE D/C IN 3 DAYS,after permacath placement,  DEPENDING ON CLINICAL CONDITION.   Enid Baas M.D on 06/21/2015 at 3:53 PM  Between 7am to 6pm - Pager - [phone]  After 6pm go to [email or web address] - password [email or web address] [facility]  [facility]  Office  [phone]  CC: Primary care physician; Darrow Bussing, MD

## 2015-06-21 NOTE — Anesthesia Postprocedure Evaluation (Signed)
  Anesthesia Post-op Note  Patient: Shane Hamilton  Procedure(s) Performed: Procedure(s): IRRIGATION AND DEBRIDEMENT EXTREMITY (Left) APPLICATION OF WOUND VAC (Left) ARTERIOVENOUS (AV) FISTULA  ligation , excision (Left)  Anesthesia type:General  Patient location: PACU  Post pain: Pain level controlled  Post assessment: Post-op Vital signs reviewed, Patient's Cardiovascular Status Stable, Respiratory Function Stable, Patent Airway and No signs of Nausea or vomiting  Post vital signs: Reviewed and stable  Last Vitals:  Filed Vitals:   06/21/15 0600  BP: 95/58  Pulse: 100  Temp:   Resp: 15    Level of consciousness: awake, alert  and patient cooperative  Complications: No apparent anesthesia complications

## 2015-06-21 NOTE — Progress Notes (Signed)
Pt. Extubated and placed on 2 liters of oxgen,sat 97,rr 20,no resp. Distress noted at this time.

## 2015-06-22 DIAGNOSIS — T827XXS Infection and inflammatory reaction due to other cardiac and vascular devices, implants and grafts, sequela: Secondary | ICD-10-CM

## 2015-06-22 LAB — BASIC METABOLIC PANEL
Anion gap: 11 (ref 5–15)
BUN: 43 mg/dL — AB (ref 6–20)
CHLORIDE: 97 mmol/L — AB (ref 101–111)
CO2: 28 mmol/L (ref 22–32)
CREATININE: 11.8 mg/dL — AB (ref 0.61–1.24)
Calcium: 8.3 mg/dL — ABNORMAL LOW (ref 8.9–10.3)
GFR calc Af Amer: 5 mL/min — ABNORMAL LOW (ref >60)
GFR calc non Af Amer: 4 mL/min — ABNORMAL LOW (ref >60)
GLUCOSE: 109 mg/dL — AB (ref 65–99)
POTASSIUM: 3.8 mmol/L (ref 3.5–5.1)
SODIUM: 136 mmol/L (ref 135–145)

## 2015-06-22 LAB — GLUCOSE, CAPILLARY
Glucose-Capillary: 116 mg/dL — ABNORMAL HIGH (ref 65–99)
Glucose-Capillary: 102 mg/dL — ABNORMAL HIGH (ref 65–99)
Glucose-Capillary: 102 mg/dL — ABNORMAL HIGH (ref 65–99)
Glucose-Capillary: 128 mg/dL — ABNORMAL HIGH (ref 65–99)

## 2015-06-22 LAB — CBC
HEMATOCRIT: 29.6 % — AB (ref 40.0–52.0)
Hemoglobin: 9.6 g/dL — ABNORMAL LOW (ref 13.0–18.0)
MCH: 34.9 pg — AB (ref 26.0–34.0)
MCHC: 32.6 g/dL (ref 32.0–36.0)
MCV: 107.1 fL — AB (ref 80.0–100.0)
PLATELETS: 261 10*3/uL (ref 150–440)
RBC: 2.77 MIL/uL — ABNORMAL LOW (ref 4.40–5.90)
RDW: 17.7 % — AB (ref 11.5–14.5)
WBC: 9 10*3/uL (ref 3.8–10.6)

## 2015-06-22 LAB — CULTURE, BLOOD (ROUTINE X 2)
CULTURE: NO GROWTH
Culture: NO GROWTH

## 2015-06-22 LAB — BODY FLUID CULTURE

## 2015-06-22 MED ORDER — GABAPENTIN 600 MG PO TABS
300.0000 mg | ORAL_TABLET | Freq: Three times a day (TID) | ORAL | Status: DC
  Filled 2015-06-22 (×4): qty 0.5

## 2015-06-22 MED ORDER — EPOETIN ALFA 4000 UNIT/ML IJ SOLN
4000.0000 [IU] | INTRAMUSCULAR | Status: DC
  Administered 2015-06-22 – 2015-06-25 (×2): 4000 [IU] via INTRAVENOUS
  Filled 2015-06-22 (×2): qty 1

## 2015-06-22 MED ORDER — CALCIUM ACETATE (PHOS BINDER) 667 MG PO CAPS
2001.0000 mg | ORAL_CAPSULE | Freq: Three times a day (TID) | ORAL | Status: DC
  Administered 2015-06-23 – 2015-06-26 (×6): 2001 mg via ORAL
  Filled 2015-06-22 (×6): qty 3

## 2015-06-22 MED ORDER — GABAPENTIN 300 MG PO CAPS
300.0000 mg | ORAL_CAPSULE | Freq: Three times a day (TID) | ORAL | Status: DC
  Administered 2015-06-22 – 2015-06-26 (×9): 300 mg via ORAL
  Filled 2015-06-22 (×9): qty 1

## 2015-06-22 NOTE — Progress Notes (Signed)
Pt refused to wear his bipap all night.

## 2015-06-22 NOTE — Progress Notes (Signed)
[facility]  Daily Progress Note   Subjective: 2 Day Post-Op: Excision of infected left arm basilic AV fistula with multiple previously placed stents, I&D of wound abscess and VAC dressing placement.  3 Days Post-Op: Insertion of temporary dialysis catheter catheter right femoral approach.  Asking when he can leave hospital. States arm is "sore." Discussed wound VAC and how he will need to go home with it and undergo changes every three days. He expressed his understanding. Patient also understands he will need a permcath placed for dialysis access as he no longer has access in his arm. He expressed his understanding.   Objective: Filed Vitals:   06/22/15 0600 06/22/15 0700 06/22/15 0800 06/22/15 1000  BP: 97/69 112/89 111/73 139/109  Pulse: 107 106 103 104  Temp:   98 F (36.7 C)   TempSrc:   Oral   Resp: Height:      Weight:      SpO2: 99% 100% 100% 97%    Intake/Output Summary (Last 24 hours) at 06/22/15 1405 Last data filed at 06/22/15 0027  Gross per 24 hour  Intake 247.87 ml  Output      0 ml  Net 247.87 ml    Physical Exam: A&Ox3, NAD CV: RRR Pulmonary: CTA Bilaterally Left Upper Extremity: VAC in place to suction, Incisions: clean dry and intact, minimal swelling, minimal erythema, pulses intact. Abdomen: Soft, Nontender, Nondistended Right Groin: Catheter intact, no draining or signs infection, working appropriately   Laboratory: CBC    Component Value Date/Time   WBC 9.0 06/22/2015 0447   WBC 7.3 04/24/2014 1434   HGB 9.6* 06/22/2015 0447   HGB 8.5* 04/24/2014 1434   HCT 29.6* 06/22/2015 0447   HCT 26.9* 04/24/2014 1434   PLT 261 06/22/2015 0447   PLT 309 04/24/2014 1434    BMET    Component Value Date/Time   NA 136 06/22/2015 0447   K 3.8 06/22/2015 0447   K 3.6 04/24/2014 1434   CL 97* 06/22/2015 0447   CO2 28 06/22/2015 0447   GLUCOSE 109* 06/22/2015 0447   BUN 43* 06/22/2015 0447   CREATININE 11.80*  06/22/2015 0447   CALCIUM 8.3* 06/22/2015 0447   GFRNONAA 4* 06/22/2015 0447   GFRAA 5* 06/22/2015 0447    Assessment/Planning: 49 year old male s/p excision of infected left arm basilic AV fistula with multiple previously placed stents, I&D of wound abscess and VAC dressing placement. 1) Next VAC change Sunday 2) Continue to use temp dialysis catheter for dialysis 3) OK for PT with temp dialysis catheter 4) Permcath placement on Tuesday with Dr. Wyn Quaker 5) Care as per primary team  [facility] PA-C 06/22/2015 2:05 PM

## 2015-06-22 NOTE — Progress Notes (Signed)
ANTIBIOTIC CONSULT NOTE - follow-up  Pharmacy Consult for Cefazolin Indication: fistula abscess  Allergies  Allergen Reactions  . Aspirin Other (See Comments)    Reaction:  GI upset     Patient Measurements: Height: 5\' 4"  (162.6 cm) Weight: 217 lb 2.5 oz (98.5 kg) IBW/kg (Calculated) : 59.2 Adjusted Body Weight: 123 kg  Vital Signs: Temp: 98 F (36.7 C) (09/17 0800) Temp Source: Oral (09/17 0800) BP: 139/109 mmHg (09/17 1000) Pulse Rate: 104 (09/17 1000) Intake/Output from previous day: 09/16 0701 - 09/17 0700 In: 607.2 [P.O.:120; I.V.:315.7; NG/GT:121.5; IV Piggyback:50] Out: -  Intake/Output from this shift:    Labs:  Recent Labs  06/20/15 0212 06/21/15 0548 06/22/15 0447  WBC 10.9* 11.0* 9.0  HGB 10.6* 9.5* 9.6*  PLT 263 308 261  CREATININE 11.47* 9.32* 11.80*   Estimated Creatinine Clearance: 8.1 mL/min (by C-G formula based on Cr of 11.8). No results for input(s): VANCOTROUGH, VANCOPEAK, VANCORANDOM, GENTTROUGH, GENTPEAK, GENTRANDOM, TOBRATROUGH, TOBRAPEAK, TOBRARND, AMIKACINPEAK, AMIKACINTROU, AMIKACIN in the last 72 hours.   Microbiology: Recent Results (from the past 720 hour(s))  Culture, routine-abscess     Status: None   Collection Time: 06/17/15  1:05 PM  Result Value Ref Range Status   Specimen Description ABSCESS LEFT ARM  Final   Special Requests NONE  Final   Gram Stain   Final    FEW WBC PRESENT,BOTH PMN AND MONONUCLEAR RARE SQUAMOUS EPITHELIAL CELLS PRESENT RARE GRAM POSITIVE COCCI IN PAIRS Performed at [facility]    Culture   Final    MODERATE STAPHYLOCOCCUS AUREUS Note: RIFAMPIN AND GENTAMICIN SHOULD NOT BE USED AS SINGLE DRUGS FOR TREATMENT OF STAPH INFECTIONS. Performed at [facility]    Report Status 06/20/2015 FINAL  Final   Organism ID, Bacteria STAPHYLOCOCCUS AUREUS  Final      Susceptibility   Staphylococcus aureus - MIC*    CLINDAMYCIN <=0.25 SENSITIVE Sensitive     ERYTHROMYCIN <=0.25 SENSITIVE  Sensitive     GENTAMICIN <=0.5 SENSITIVE Sensitive     LEVOFLOXACIN 0.25 SENSITIVE Sensitive     OXACILLIN <=0.25 SENSITIVE Sensitive     RIFAMPIN <=0.5 SENSITIVE Sensitive     TRIMETH/SULFA <=10 SENSITIVE Sensitive     VANCOMYCIN 1 SENSITIVE Sensitive     TETRACYCLINE >=16 RESISTANT Resistant     MOXIFLOXACIN <=0.25 SENSITIVE Sensitive     * MODERATE STAPHYLOCOCCUS AUREUS  Culture, blood (routine x 2)     Status: None (Preliminary result)   Collection Time: 06/17/15  1:20 PM  Result Value Ref Range Status   Specimen Description BLOOD RIGHT ARM  Final   Special Requests BOTTLES DRAWN AEROBIC AND ANAEROBIC  Final   Culture NO GROWTH 4 DAYS  Final   Report Status PENDING  Incomplete  Culture, blood (routine x 2)     Status: None (Preliminary result)   Collection Time: 06/17/15  1:30 PM  Result Value Ref Range Status   Specimen Description BLOOD RIGHT HAND  Final   Special Requests BOTTLES DRAWN AEROBIC AND ANAEROBIC  Final   Culture NO GROWTH 4 DAYS  Final   Report Status PENDING  Incomplete  Body fluid culture     Status: None (Preliminary result)   Collection Time: 06/18/15  4:38 PM  Result Value Ref Range Status   Specimen Description ABSCESS  Final   Special Requests NONE  Final   Gram Stain   Final    TOO NUMEROUS TO COUNT WBC SEEN MODERATE GRAM POSITIVE COCCI FEW  GRAM NEGATIVE RODS    Culture MODERATE GROWTH STAPHYLOCOCCUS AUREUS  Final   Report Status PENDING  Incomplete   Organism ID, Bacteria STAPHYLOCOCCUS AUREUS  Final      Susceptibility   Staphylococcus aureus - MIC*    CIPROFLOXACIN <=0.5 SENSITIVE Sensitive     GENTAMICIN <=0.5 SENSITIVE Sensitive     OXACILLIN 0.5 SENSITIVE Sensitive     TRIMETH/SULFA <=10 SENSITIVE Sensitive     CEFOXITIN SCREEN NEGATIVE Sensitive     Inducible Clindamycin NEGATIVE Sensitive     TETRACYCLINE Value in next row Resistant      RESISTANT>=16    * MODERATE GROWTH STAPHYLOCOCCUS AUREUS  MRSA PCR Screening     Status:  None   Collection Time: 06/19/15  3:29 PM  Result Value Ref Range Status   MRSA by PCR NEGATIVE NEGATIVE Final    Comment:        The GeneXpert MRSA Assay (FDA approved for NASAL specimens only), is one component of a comprehensive MRSA colonization surveillance program. It is not intended to diagnose MRSA infection nor to guide or monitor treatment for MRSA infections.   Culture, blood (routine x 2)     Status: None (Preliminary result)   Collection Time: 06/19/15  4:00 PM  Result Value Ref Range Status   Specimen Description BLOOD RIGHT ARM  Final   Special Requests BOTTLES DRAWN AEROBIC AND ANAEROBIC  2CC  Final   Culture NO GROWTH 3 DAYS  Final   Report Status PENDING  Incomplete  Culture, blood (routine x 2)     Status: None (Preliminary result)   Collection Time: 06/19/15  4:00 PM  Result Value Ref Range Status   Specimen Description BLOOD RIGHT ARM  Final   Special Requests BOTTLES DRAWN AEROBIC AND ANAEROBIC  2CC  Final   Culture NO GROWTH 3 DAYS  Final   Report Status PENDING  Incomplete    Medical History: Past Medical History  Diagnosis Date  . Type 2 diabetes mellitus, uncontrolled        . Morbid obesity   . OSA (obstructive sleep apnea)   . Septic shock(785.52)   . Anemia   . Hypertension   . Seizures   . Gout   . ESRD (end stage renal disease)   . LOC (loss of consciousness)   . DVT (deep venous thrombosis) 2011  . Cardiomegaly   . Chronic anticoagulation   . Chronic pancreatitis   . Secondary hyperparathyroidism     Medications:  Prescriptions prior to admission  Medication Sig Dispense Refill Last Dose  . allopurinol (ZYLOPRIM) 300 MG tablet Take 300 mg by mouth 2 (two) times daily.     . cinacalcet (SENSIPAR) 60 MG tablet Take 60 mg by mouth daily.   06/17/2015 at Unknown time  . clonazePAM (KLONOPIN) 1 MG tablet Take 1 mg by mouth 2 (two) times daily.   06/17/2015 at Unknown time  . colchicine 0.6 MG tablet Take 0.6 mg by mouth daily.    06/17/2015 at Unknown time  . gabapentin (NEURONTIN) 300 MG capsule Take 300 mg by mouth every 8 (eight) hours.   06/17/2015 at Unknown time  . hydrOXYzine (ATARAX/VISTARIL) 25 MG tablet Take 25 mg by mouth 3 (three) times daily as needed.   06/17/2015 at Unknown time  . insulin aspart protamine-insulin aspart (NOVOLOG 70/30) (70-30) 100 UNIT/ML injection Inject 35 Units into the skin 3 (three) times daily with meals.    06/17/2015 at Unknown time  . insulin glargine (  LANTUS) 100 UNIT/ML injection Inject 70 Units into the skin at bedtime.    06/16/2015 at Unknown time  . omeprazole (PRILOSEC) 20 MG capsule Take 20 mg by mouth 2 (two) times daily before a meal.    06/17/2015 at Unknown time  . Oxycodone HCl 10 MG TABS Take 10 mg by mouth every 6 (six) hours as needed (pain).    Past Week at Unknown time  . warfarin (COUMADIN) 7.5 MG tablet Take 7.5 mg by mouth See admin instructions. Pt takes 7.5mg  on Tuesday, Wednesday, Thursday, Saturday and Sunday and  on Monday and Friday   06/17/2015 at Unknown time  . amLODipine (NORVASC) 5 MG tablet Take 5 mg by mouth daily.   Not Taking at Unknown time  . atorvastatin (LIPITOR) 10 MG tablet Take 10 mg by mouth daily.   Not Taking at Unknown time  . calcium acetate (PHOSLO) 667 MG capsule Take 2,001 mg by mouth 3 (three) times daily with meals.    Not Taking at Unknown time  . lanthanum (FOSRENOL) 1000 MG chewable tablet Chew 1,000 mg by mouth 2 (two) times daily with a meal.   Not Taking at Unknown time  . multivitamin (RENA-VIT) TABS tablet Take 1 tablet by mouth daily.   3 Not Taking at Unknown time   Assessment: 49 y/o M with infected AV fistula on Cefazolin. (Patient was on vancomycin and Zosyn).  Abscess cx growing MSSA. Hemodialysis patient.  Plan:  Will continue cefazolin 1 g iv q 24 hours (HD dosing)  Bari Mantis PharmD Clinical Pharmacist 06/22/2015 2:12 PM

## 2015-06-22 NOTE — Progress Notes (Signed)
Offered for the 2nd tome to place Bipap. Pt is sitting up in bed watching tv and isn't ready to wear Bipap. Will follow up later with pt

## 2015-06-22 NOTE — Progress Notes (Signed)
Noticed pt dozing off to sleep, woke pt and offered to place Bipap. Pt refused and began looking at his phone. I encouraged pt to wear Bipap even if he wasn't ready to go to sleep just incase he dozed off again but pt continues to refuse. Explained the negative side effects of not wearing Bipap and pt continues to refuse.

## 2015-06-22 NOTE — Progress Notes (Signed)
Subjective:   Patient remains critically ill Appears somewhat disoriented to place Underwent excision of infected left arm basilic AV fistula with multiple previously placed stents by Dr. Gilda Crease  on September 15    Objective:  Vital signs in last 24 hours:  Temp:  [98.2 F (36.8 C)-99.1 F (37.3 C)] 98.5 F (36.9 C) (09/17 0400) Pulse Rate:  [95-111] 107 (09/17 0600) Resp:  [14-25] 18 (09/17 0600) BP: (89-122)/(51-76) 97/69 mmHg (09/17 0600) SpO2:  [91 %-100 %] 99 % (09/17 0600)  Weight change:  Filed Weights   06/20/15 1215 06/20/15 1524 06/21/15 0000  Weight: 200.1 kg (441 lb 2.3 oz) 200.1 kg (441 lb 2.3 oz) 98.5 kg (217 lb 2.5 oz)    Intake/Output:    Intake/Output Summary (Last 24 hours) at 06/22/15 0841 Last data filed at 06/22/15 0027  Gross per 24 hour  Intake  540.5 ml  Output      0 ml  Net  540.5 ml     Physical Exam: General: critically ill appearing, morbidly obese  HEENT ETT in place  Neck supple  Pulm/lungs Vent assisted, minimal crackles,   CVS/Heart Regular, no rub  Abdomen:  Soft, obese, non distended  Extremities: Trace edema  Neurologic: sedated  Skin: No acute rashes  Access: Temp cath/ sep 14; infected AVF       Basic Metabolic Panel:   Recent Labs Lab 06/18/15 1420 06/19/15 0622 06/20/15 0212 06/21/15 0548 06/22/15 0447  NA 135 137 139 138 136  K 3.1* 4.1 4.9 3.7 3.8  CL 92* 96* 101 97* 97*  CO2 GLUCOSE 160* 193* 142* 132* 109*  BUN 62* 69* 45* 31* 43*  CREATININE 11.62* 13.12* 11.47* 9.32* 11.80*  CALCIUM 8.9 8.6* 7.8* 8.6* 8.3*     CBC:  Recent Labs Lab 06/17/15 1330  06/18/15 1420 06/19/15 0622 06/20/15 0212 06/21/15 0548 06/22/15 0447  WBC 10.5  < > 10.8* 11.0* 10.9* 11.0* 9.0  NEUTROABS 6.7  --  6.8*  --   --   --   --   HGB 11.9*  < > 11.2* 11.6* 10.6* 9.5* 9.6*  HCT 36.8*  < > 35.4* 36.9* 34.0* 30.1* 29.6*  MCV 105.1*  < > 105.3* 106.8* 108.0* 106.9* 107.1*  PLT 317  < > 307 332 263  308 261  < > = values in this interval not displayed.    Microbiology:  Recent Results (from the past 720 hour(s))  Culture, routine-abscess     Status: None   Collection Time: 06/17/15  1:05 PM  Result Value Ref Range Status   Specimen Description ABSCESS LEFT ARM  Final   Special Requests NONE  Final   Gram Stain   Final    FEW WBC PRESENT,BOTH PMN AND MONONUCLEAR RARE SQUAMOUS EPITHELIAL CELLS PRESENT RARE GRAM POSITIVE COCCI IN PAIRS Performed at [facility]    Culture   Final    MODERATE STAPHYLOCOCCUS AUREUS Note: RIFAMPIN AND GENTAMICIN SHOULD NOT BE USED AS SINGLE DRUGS FOR TREATMENT OF STAPH INFECTIONS. Performed at [facility]    Report Status 06/20/2015 FINAL  Final   Organism ID, Bacteria STAPHYLOCOCCUS AUREUS  Final      Susceptibility   Staphylococcus aureus - MIC*    CLINDAMYCIN <=0.25 SENSITIVE Sensitive     ERYTHROMYCIN <=0.25 SENSITIVE Sensitive     GENTAMICIN <=0.5 SENSITIVE Sensitive     LEVOFLOXACIN 0.25 SENSITIVE Sensitive     OXACILLIN <=0.25 SENSITIVE Sensitive  RIFAMPIN <=0.5 SENSITIVE Sensitive     TRIMETH/SULFA <=10 SENSITIVE Sensitive     VANCOMYCIN 1 SENSITIVE Sensitive     TETRACYCLINE >=16 RESISTANT Resistant     MOXIFLOXACIN <=0.25 SENSITIVE Sensitive     * MODERATE STAPHYLOCOCCUS AUREUS  Culture, blood (routine x 2)     Status: None (Preliminary result)   Collection Time: 06/17/15  1:20 PM  Result Value Ref Range Status   Specimen Description BLOOD RIGHT ARM  Final   Special Requests BOTTLES DRAWN AEROBIC AND ANAEROBIC  Final   Culture NO GROWTH 4 DAYS  Final   Report Status PENDING  Incomplete  Culture, blood (routine x 2)     Status: None (Preliminary result)   Collection Time: 06/17/15  1:30 PM  Result Value Ref Range Status   Specimen Description BLOOD RIGHT HAND  Final   Special Requests BOTTLES DRAWN AEROBIC AND ANAEROBIC  Final   Culture NO GROWTH 4 DAYS  Final   Report Status PENDING  Incomplete   Body fluid culture     Status: None (Preliminary result)   Collection Time: 06/18/15  4:38 PM  Result Value Ref Range Status   Specimen Description ABSCESS  Final   Special Requests NONE  Final   Gram Stain   Final    TOO NUMEROUS TO COUNT WBC SEEN MODERATE GRAM POSITIVE COCCI FEW GRAM NEGATIVE RODS    Culture MODERATE GROWTH STAPHYLOCOCCUS AUREUS  Final   Report Status PENDING  Incomplete   Organism ID, Bacteria STAPHYLOCOCCUS AUREUS  Final      Susceptibility   Staphylococcus aureus - MIC*    CIPROFLOXACIN <=0.5 SENSITIVE Sensitive     GENTAMICIN <=0.5 SENSITIVE Sensitive     OXACILLIN 0.5 SENSITIVE Sensitive     TRIMETH/SULFA <=10 SENSITIVE Sensitive     CEFOXITIN SCREEN NEGATIVE Sensitive     Inducible Clindamycin NEGATIVE Sensitive     TETRACYCLINE Value in next row Resistant      RESISTANT>=16    * MODERATE GROWTH STAPHYLOCOCCUS AUREUS  MRSA PCR Screening     Status: None   Collection Time: 06/19/15  3:29 PM  Result Value Ref Range Status   MRSA by PCR NEGATIVE NEGATIVE Final    Comment:        The GeneXpert MRSA Assay (FDA approved for NASAL specimens only), is one component of a comprehensive MRSA colonization surveillance program. It is not intended to diagnose MRSA infection nor to guide or monitor treatment for MRSA infections.   Culture, blood (routine x 2)     Status: None (Preliminary result)   Collection Time: 06/19/15  4:00 PM  Result Value Ref Range Status   Specimen Description BLOOD RIGHT ARM  Final   Special Requests BOTTLES DRAWN AEROBIC AND ANAEROBIC  2CC  Final   Culture NO GROWTH 3 DAYS  Final   Report Status PENDING  Incomplete  Culture, blood (routine x 2)     Status: None (Preliminary result)   Collection Time: 06/19/15  4:00 PM  Result Value Ref Range Status   Specimen Description BLOOD RIGHT ARM  Final   Special Requests BOTTLES DRAWN AEROBIC AND ANAEROBIC  2CC  Final   Culture NO GROWTH 3 DAYS  Final   Report Status PENDING   Incomplete    Coagulation Studies: No results for input(s): LABPROT, INR in the last 72 hours.  Urinalysis: No results for input(s): COLORURINE, LABSPEC, PHURINE, GLUCOSEU, HGBUR, BILIRUBINUR, KETONESUR, PROTEINUR, UROBILINOGEN, NITRITE, LEUKOCYTESUR in the last 72 hours.  Invalid input(s): APPERANCEUR    Imaging: Dg Chest Port 1 View  06/21/2015   CLINICAL DATA:  Respiratory failure.  EXAM: PORTABLE CHEST - 1 VIEW  COMPARISON:  06/19/2015.  FINDINGS: Endotracheal tube and NG tube in stable position. Cardiomegaly with pulmonary vascular prominence and bilateral interstitial prominence consistent congestive heart failure noted. Low lung volumes with bibasilar atelectasis . Small bilateral pleural effusions cannot be excluded. No pneumothorax .  IMPRESSION: 1. Lines and tubes stable position. 2. Persistent cardiomegaly with bilateral pulmonary alveolar infiltrates consistent with congestive heart failure and pulmonary edema. Bilateral pleural effusions cannot be excluded. 3. Low lung volumes with basilar atelectasis.   Electronically Signed   By: Maisie Fus  Register   On: 06/21/2015 07:22     Medications:   . norepinephrine Stopped (06/21/15 1805)   . [START ON 06/23/2015] allopurinol  100 mg Oral Q48H  . antiseptic oral rinse  7 mL Mouth Rinse QID  . atorvastatin  10 mg Per Tube Daily  .  ceFAZolin (ANCEF) IV  1 g Intravenous Q24H  . chlorhexidine gluconate  15 mL Mouth Rinse BID  . heparin subcutaneous  5,000 Units Subcutaneous 3 times per day  . insulin aspart  0-20 Units Subcutaneous TID WC  . insulin aspart  0-5 Units Subcutaneous QHS  . insulin glargine  35 Units Subcutaneous QHS  . pantoprazole  40 mg Oral BID AC   acetaminophen **OR** acetaminophen, ondansetron **OR** ondansetron (ZOFRAN) IV, senna-docusate  Assessment/ Plan:  49 y.o. male male with a PMHX of ESRD, Morbid obesity, OSA, Chronic anticoagulation for DVT, SHPTH, was admitted on 06/18/2015 with sepsis, acute resp  failure. Excision of infected left arm basilic AV fistula with multiple previously placed stents sep 15  1. ESRD.  [facility]. [place] [facility] 2. Sepsis from infected AVF. Staph aureus (MSSA). S/p Excision of infected left arm basilic AV fistula with multiple previously placed stents. SEP 15 3. AOCKD 4. SHPTH 5. Acute resp failure  Plan: ICU support for critical illness and sepsis Broad spectrum ABx as per primary team We'll plan for dialysis today  Hold binders/vitamins until able to eat normal diet. EPO With dialysis   LOS: 4 SINGH,HARMEET 9/17/20168:41 AM

## 2015-06-22 NOTE — Progress Notes (Signed)
Offered to place pt on Bipap but pt wasn't ready. Pt was on cell phone. Asked pt to call when he was ready to place Bipap.

## 2015-06-22 NOTE — Progress Notes (Signed)
[facility] [place] [facility]   PATIENT NAME: Shane Hamilton    MR#:  [id number]  DATE OF BIRTH:  August 22, 1966  SUBJECTIVE:  CHIEF COMPLAINT:   Chief Complaint  Patient presents with  . infection of fistula    - Patient doing well and extubated 06/21/15 morning. Currently on 2 L nasal cannula.refused for C PAP last night. - Patient completely alert and oriented -Infected AV fistula excised  06/20/15  REVIEW OF SYSTEMS:  Review of Systems  Constitutional: Negative for fever and chills.  HENT: Negative for ear discharge, ear pain and nosebleeds.   Respiratory: Negative for cough, shortness of breath and wheezing.   Cardiovascular: Negative for chest pain and palpitations.  Gastrointestinal: Negative for nausea, vomiting, abdominal pain, diarrhea and constipation.  Genitourinary: Negative for dysuria and urgency.  Neurological: Negative for dizziness, sensory change, speech change, focal weakness, seizures and headaches.  Psychiatric/Behavioral: Negative for depression.    DRUG ALLERGIES:   Allergies  Allergen Reactions  . Aspirin Other (See Comments)    Reaction:  GI upset     VITALS:  Blood pressure 139/109, pulse 104, temperature 98 F (36.7 C), temperature source Oral, resp. rate 13, height  (1.626 m), weight 98.5 kg (217 lb 2.5 oz), SpO2 97 %.  PHYSICAL EXAMINATION:  Physical Exam  GENERAL:  49 y.o.-year-old morbidly obese patient sitting in the bed EYES: Pupils equal, round, reactive to light and accommodation. Proptosis of the eyes noted. No scleral icterus.  extraocular movements intact  HEENT: Head atraumatic, normocephalic. Oropharynx and nasopharynx clear.  NECK:  Neck is short and thick. Supple, no jugular venous distention. No thyroid enlargement, no tenderness.  LUNGS: Decreased bilateral breath sounds, no wheezes or crackles heard.. No use of accessory muscles of respiration. CARDIOVASCULAR: S1, S2 normal. No murmurs, rubs,  or gallops.  ABDOMEN: Obese abdomen. Soft, nontender, nondistended. Bowel sounds present. No organomegaly or mass.  EXTREMITIES: 1+ pedal edema present, no cyanosis, or clubbing.  Right hand first 2 digits indicated traumatic contracture injury. Left arm AV fistula excision  done yesterday. Dressing in place NEUROLOGIC:  Cranial nerves intact, no focal motor or sensory deficits. Able to move all extremities and sensation intact PSYCHIATRIC: The patient is alert and oriented 3  SKIN: No obvious rash, lesion, or ulcer.    LABORATORY PANEL:   CBC  Recent Labs Lab 06/22/15 0447  WBC 9.0  HGB 9.6*  HCT 29.6*  PLT 261   ------------------------------------------------------------------------------------------------------------------  Chemistries   Recent Labs Lab 06/22/15 0447  NA 136  K 3.8  CL 97*  CO2 28  GLUCOSE 109*  BUN 43*  CREATININE 11.80*  CALCIUM 8.3*   ------------------------------------------------------------------------------------------------------------------  Cardiac Enzymes No results for input(s): TROPONINI in the last 168 hours. ------------------------------------------------------------------------------------------------------------------  RADIOLOGY:  Dg Chest Port 1 View  06/21/2015   CLINICAL DATA:  Respiratory failure.  EXAM: PORTABLE CHEST - 1 VIEW  COMPARISON:  06/19/2015.  FINDINGS: Endotracheal tube and NG tube in stable position. Cardiomegaly with pulmonary vascular prominence and bilateral interstitial prominence consistent congestive heart failure noted. Low lung volumes with bibasilar atelectasis . Small bilateral pleural effusions cannot be excluded. No pneumothorax .  IMPRESSION: 1. Lines and tubes stable position. 2. Persistent cardiomegaly with bilateral pulmonary alveolar infiltrates consistent with congestive heart failure and pulmonary edema. Bilateral pleural effusions cannot be excluded. 3. Low lung volumes with basilar atelectasis.    Electronically Signed   By: Maisie Fus  Register   On: 06/21/2015 07:22  EKG:  No orders found for this or any previous visit.  ASSESSMENT AND PLAN:   49 year old morbidly obese male with past medical history significant for sleep apnea, end-stage renal disease, diabetes mellitus and chronic DVT presents secondary to AV fistula abscess. Also went into acute hypercarbic hypoxic respiratory failure.  #1 Acute respiratory failure-hypoxic and hypercarbic respiratory failure - Failed BiPAP and had to be intubated. Appreciate  pulmonary consult. -doing well, extubated 9/16 morning. Back on 2 L oxygen which is his chronic home O2  - will need CPAP due to sleep apnea.  #2 Sepsis and AV fistula infection/abscess-blood cultures from [facility] from 06/17/2015 are negative so far - Wound cultures by aspirating his fistula on admission growing MSSA - appreciate ID consult,  on ancef for 4-6 weeks with hemodialysis - AV fistula excised 06/20/15  #3 End-stage renal disease-on 4 times a week dialysis. -On Monday, Tuesday, Thursday and Saturday schedule.  - AV fistula infection with MSSA abscess - temp dialysis catheter placed, s/p excision of AV fistula 06/20/15  - will need a permacath prior to discharge- discussed with Dr. Wyn Quaker, likely placement Monday -Appreciate vascular and nephrology consults  #4 hypotension-post intubation and while on sedation. - Held blood pressure medications for now. Required Levophed drip- tapered off last evening. - also sepsis from AV graft infection  #5 diabetes mellitus-Lantus dose is decreased to half as patient is nothing by mouth and SLIDING SCALE INSULIN.  #6 DVT prophylaxis-subcutaneous heparin  #7 GI prophylaxis-on Protonix  All the records are reviewed and case discussed with Care Management/Social Workerr. Management plans discussed with the patient, family and they are in agreement.  CODE STATUS: Full code  TOTAL  TIME SPENT IN TAKING CARE  OF THIS PATIENT: 35 minutes.   POSSIBLE D/C IN 3 DAYS,after permacath placement,  DEPENDING ON CLINICAL CONDITION.   Altamese Dilling M.D on 06/22/2015 at 1:56 PM  Between 7am to 6pm - Pager - [phone]  After 6pm go to [email or web address] - password [email or web address] [facility]  [facility]  Office  [phone]  CC: Primary care physician; Darrow Bussing, MD

## 2015-06-22 NOTE — Progress Notes (Signed)
Pt remains alert and oriented; on 3L O2 with O2 sats upper 90's no signs or symptoms of respiratory distress; no c/o pain; sinus tach/sinus rhythm on cardiac monitor; pt currently getting hemodialysis and tolerating well; plans for pt to transfer to room 222A following hemodialysis

## 2015-06-22 NOTE — Progress Notes (Signed)
Report given to stacy pt. Then moved to rm 222 in bariatric bed. On 4lnc tolerated well. Family also at bedside

## 2015-06-22 NOTE — Progress Notes (Signed)
PULMONARY / CRITICAL CARE MEDICINE   Name: Shane Hamilton MRN: [id number] DOB: 21-Feb-1966    ADMISSION DATE:  06/18/2015  INITIAL PRESENTATION: acute resp failure, extubated yesterday, refused to wear biPAP last night,  somewhat confused    MAJOR EVENTS/TEST RESULTS: 9/14 LE venous Dopplers >> No DVT 9/14 Vasc surgery consultation. Surgical drainage of LUE abscess planned for 9/15 9/14 Renal consultation. HD performed 9/14 PM: intubated. Hypotension post procedure. Norepinephrine gtt initiated 9/15 HD performed 9/15 Excision of infected RUE AVF 9/16 Extubated. Tolerating initially. Off vasopressors 9/17-refuses to wear biPAP, HD today  INDWELLING DEVICES:: ETT 9/14 >> 9/16 R femoral HD cath 9/14 >>   MICRO DATA: LUE abscess 9/13 >> MSSA Blood 9/14 >>   ANTIMICROBIALS:  Vanc 9/13 >> 9/14 Cefazolin 9/14 >>    VITAL SIGNS: Temp:  [98.2 F (36.8 C)-99.1 F (37.3 C)] 98.5 F (36.9 C) (09/17 0400) Pulse Rate:  [95-111] 103 (09/17 0800) Resp:  [14-26] 26 (09/17 0800) BP: (89-122)/(51-89) 111/73 mmHg (09/17 0800) SpO2:  [91 %-100 %] 100 % (09/17 0800) HEMODYNAMICS:   VENTILATOR SETTINGS:   INTAKE / OUTPUT:  Intake/Output Summary (Last 24 hours) at 06/22/15 0918 Last data filed at 06/22/15 0027  Gross per 24 hour  Intake 505.77 ml  Output      0 ml  Net 505.77 ml    PHYSICAL EXAMINATION: General: Severely obese,  Neuro: PERRL, EOMI,  HEENT: plethoric, NCAT Cardiovascular: tachy, regular, no M Lungs: no adventitious sounds anteriorly Abdomen: very obese, distant BS, nontender Ext: chronic stasis changes, trace symmetric edema, missing 1st and 2nd digits on RUE, diminished R radial pulse  LABS:  CBC  Recent Labs Lab 06/20/15 0212 06/21/15 0548 06/22/15 0447  WBC 10.9* 11.0* 9.0  HGB 10.6* 9.5* 9.6*  HCT 34.0* 30.1* 29.6*  PLT 263 308 261   Coag's  Recent Labs Lab 06/17/15 2035 06/18/15 0418 06/19/15 0622  INR 2.12* 1.89* 1.53    BMET  Recent Labs Lab 06/20/15 0212 06/21/15 0548 06/22/15 0447  NA 139 138 136  K 4.9 3.7 3.8  CL 101 97* 97*  CO2 BUN 45* 31* 43*  CREATININE 11.47* 9.32* 11.80*  GLUCOSE 142* 132* 109*   Electrolytes  Recent Labs Lab 06/20/15 0212 06/21/15 0548 06/22/15 0447  CALCIUM 7.8* 8.6* 8.3*   Sepsis Markers  Recent Labs Lab 06/17/15 1355  LATICACIDVEN 1.55   ABG  Recent Labs Lab 06/19/15 1310 06/19/15 1540 06/20/15 0030  PHART 7.14* 7.26* 7.27*  PCO2ART 84* 59* 58*  PO2ART 35* 41* 63*   Liver Enzymes No results for input(s): AST, ALT, ALKPHOS, BILITOT, ALBUMIN in the last 168 hours. Cardiac Enzymes No results for input(s): TROPONINI, PROBNP in the last 168 hours. Glucose  Recent Labs Lab 06/21/15 1044 06/21/15 1331 06/21/15 1626 06/21/15 2132 06/21/15 2158 06/22/15 0723  GLUCAP 134* 134* 124* 132* 117* 116*    CXR: CM, edema pattern    ASSESSMENT / PLAN:  PULMONARY A: Acute on chronic hypercarbic resp failure Decompensated OHS/OSA Pulm edema P:   -oxygen as needed Mandatory nocturnal BiPAP for severe OSA-patient refused biPAP  CARDIOVASCULAR Will make step down status  RENAL A:   ESRD P:   Monitor BMET intermittently Monitor I/Os Correct electrolytes as indicated -HD planned 9/17  GASTROINTESTINAL A:   Chronic PPI use P:   SUP: PO PPI Advance diet as tolerated  HEMATOLOGIC A:  Anemia without overt blood loss P:  DVT px: SQ heparin Monitor CBC intermittently  Transfuse as needed per usual guidelines   INFECTIOUS A:  LUE AVF abscess +MSSA P:   Monitor temp, WBC count Micro and abx as above ID service following  ENDOCRINE Watch FSBS Diet advanced  NEUROLOGIC A:   Acute encephalopathy, resolved   I have personally obtained a history, examined the patient, evaluated Pertinent laboratory and RadioGraphic/imaging results, and  formulated the assessment and plan   The Patient requires high  complexity decision making for assessment and support, frequent evaluation and titration of therapies, application of advanced monitoring technologies and extensive interpretation of multiple databases. Critical Care Time devoted to patient care services described in this note is 30 minutes.   Overall, patient is critically ill, prognosis is guarded.  Patient with Multiorgan failure and at high risk for cardiac arrest and death.    Lucie Leather, M.D.  [facility]  Medical Director [facility] Medical Director [facility] Cardio-Pulmonary Department

## 2015-06-23 LAB — GLUCOSE, CAPILLARY
GLUCOSE-CAPILLARY: 129 mg/dL — AB (ref 65–99)
GLUCOSE-CAPILLARY: 101 mg/dL — AB (ref 65–99)
Glucose-Capillary: 145 mg/dL — ABNORMAL HIGH (ref 65–99)
Glucose-Capillary: 158 mg/dL — ABNORMAL HIGH (ref 65–99)

## 2015-06-23 MED ORDER — POLYETHYLENE GLYCOL 3350 17 G PO PACK
17.0000 g | PACK | Freq: Every day | ORAL | Status: DC
  Administered 2015-06-23: 17 g via ORAL
  Filled 2015-06-23 (×3): qty 1

## 2015-06-23 MED ORDER — MORPHINE SULFATE (PF) 2 MG/ML IV SOLN
1.0000 mg | INTRAVENOUS | Status: AC
  Administered 2015-06-23: 1 mg via INTRAVENOUS
  Filled 2015-06-23: qty 1

## 2015-06-23 NOTE — Progress Notes (Signed)
[facility] - [place] at [facility]   PATIENT NAME: Shane Hamilton    MR#:  [id number]  DATE OF BIRTH:  1966/07/04  SUBJECTIVE:  CHIEF COMPLAINT:   Chief Complaint  Patient presents with  . infection of fistula    - Patient doing well and extubated 06/21/15 morning. Currently on 2 L nasal cannula.refused for C PAP last night. - Patient completely alert and oriented -Infected AV fistula excised  06/20/15 - no new complains.  REVIEW OF SYSTEMS:  Review of Systems  Constitutional: Negative for fever and chills.  HENT: Negative for ear discharge, ear pain and nosebleeds.   Respiratory: Negative for cough, shortness of breath and wheezing.   Cardiovascular: Negative for chest pain and palpitations.  Gastrointestinal: Negative for nausea, vomiting, abdominal pain, diarrhea and constipation.  Genitourinary: Negative for dysuria and urgency.  Neurological: Negative for dizziness, sensory change, speech change, focal weakness, seizures and headaches.  Psychiatric/Behavioral: Negative for depression.    DRUG ALLERGIES:   Allergies  Allergen Reactions  . Aspirin Other (See Comments)    Reaction:  GI upset     VITALS:  Blood pressure 126/85, pulse 96, temperature 98 F (36.7 C), temperature source Oral, resp. rate 20, height  (1.626 m), weight 98.5 kg (217 lb 2.5 oz), SpO2 97 %.  PHYSICAL EXAMINATION:  Physical Exam  GENERAL:  49 y.o.-year-old morbidly obese patient sitting in the bed EYES: Pupils equal, round, reactive to light and accommodation. Proptosis of the eyes noted. No scleral icterus.  extraocular movements intact  HEENT: Head atraumatic, normocephalic. Oropharynx and nasopharynx clear.  NECK:  Neck is short and thick. Supple, no jugular venous distention. No thyroid enlargement, no tenderness.  LUNGS: Decreased bilateral breath sounds, no wheezes or crackles heard.. No use of accessory muscles of respiration. CARDIOVASCULAR: S1, S2 normal.  No murmurs, rubs, or gallops.  ABDOMEN: Obese abdomen. Soft, nontender, nondistended. Bowel sounds present. No organomegaly or mass.  EXTREMITIES: 1+ pedal edema present, no cyanosis, or clubbing.  Right hand first 2 digits indicated traumatic contracture injury. Left arm AV fistula excision  done yesterday. Dressing in place NEUROLOGIC:  Cranial nerves intact, no focal motor or sensory deficits. Able to move all extremities and sensation intact PSYCHIATRIC: The patient is alert and oriented 3  SKIN: No obvious rash, lesion, or ulcer.    LABORATORY PANEL:   CBC  Recent Labs Lab 06/22/15 0447  WBC 9.0  HGB 9.6*  HCT 29.6*  PLT 261   ------------------------------------------------------------------------------------------------------------------  Chemistries   Recent Labs Lab 06/22/15 0447  NA 136  K 3.8  CL 97*  CO2 28  GLUCOSE 109*  BUN 43*  CREATININE 11.80*  CALCIUM 8.3*   ------------------------------------------------------------------------------------------------------------------  Cardiac Enzymes No results for input(s): TROPONINI in the last 168 hours. ------------------------------------------------------------------------------------------------------------------  RADIOLOGY:  No results found.  EKG:  No orders found for this or any previous visit.  ASSESSMENT AND PLAN:   49 year old morbidly obese male with past medical history significant for sleep apnea, end-stage renal disease, diabetes mellitus and chronic DVT presents secondary to AV fistula abscess. Also went into acute hypercarbic hypoxic respiratory failure.  #1 Acute respiratory failure-hypoxic and hypercarbic respiratory failure - Failed BiPAP and had to be intubated. Appreciate  pulmonary consult. -doing well, extubated 9/16 morning. Back on 2 L oxygen which is his chronic home O2  - at night need CPAP due to sleep apnea.  #2 Sepsis and AV fistula infection/abscess-blood cultures from  [facility] from 06/17/2015 are  negative so far - Wound cultures by aspirating his fistula on admission growing MSSA - appreciate ID consult,  on ancef for 4-6 weeks with hemodialysis- follow with ID clinic in 4 weeks. - AV fistula excised 06/20/15  need permacath before discharge.  #3 End-stage renal disease-on 4 times a week dialysis. -On Monday, Tuesday, Thursday and Saturday schedule.  - AV fistula infection with MSSA abscess - temp dialysis catheter placed, s/p excision of AV fistula 06/20/15  - will need a permacath prior to discharge- likely placement Monday -Appreciate vascular and nephrology consults  #4 hypotension-post intubation and while on sedation. - Held blood pressure medications for now. Required Levophed drip- tapered off . - also sepsis from AV graft infection  #5 diabetes mellitus-Lantus dose is decreased to half as patient is nothing by mouth and SLIDING SCALE INSULIN.  #6 DVT prophylaxis-subcutaneous heparin  #7 GI prophylaxis-on Protonix  All the records are reviewed and case discussed with Care Management/Social Workerr. Management plans discussed with the patient, family and they are in agreement.  CODE STATUS: Full code  TOTAL  TIME SPENT IN TAKING CARE OF THIS PATIENT: 35 minutes.   POSSIBLE D/C IN 3 DAYS,after permacath placement,  DEPENDING ON CLINICAL CONDITION.   Altamese Dilling M.D on 06/23/2015 at 2:35 PM  Between 7am to 6pm - Pager - [phone]  After 6pm go to [email or web address] - password [email or web address] [facility]  [facility]  Office  [phone]  CC: Primary care physician; Darrow Bussing, MD

## 2015-06-23 NOTE — Progress Notes (Signed)
Subjective:   Underwent excision of infected left arm basilic AV fistula with multiple previously placed stents by Dr. Gilda Crease  on September 15 Today, patient is doing well Says that he does not like the hospital food therefore his appetite is not good No complaints of shortness of breath    Objective:  Vital signs in last 24 hours:  Temp:  [98 F (36.7 C)-98.9 F (37.2 C)] 98.4 F (36.9 C) (09/17 2150) Pulse Rate:  [94-104] 98 (09/17 2150) Resp:  [10-27] 24 (09/17 2150) BP: (94-159)/(56-111) 94/56 mmHg (09/17 2150) SpO2:  [94 %-99 %] 98 % (09/17 2150)  Weight change:  Filed Weights   06/20/15 1215 06/20/15 1524 06/21/15 0000  Weight: 200.1 kg (441 lb 2.3 oz) 200.1 kg (441 lb 2.3 oz) 98.5 kg (217 lb 2.5 oz)    Intake/Output:    Intake/Output Summary (Last 24 hours) at 06/23/15 0957 Last data filed at 06/23/15 0715  Gross per 24 hour  Intake      0 ml  Output   1570 ml  Net  -1570 ml     Physical Exam: General: morbidly obese  HEENT  anicteric, moist oral mucous membranes   Neck supple  Pulm/lungs  difficult exam due to habitus but otherwise clear, normal effort   CVS/Heart Regular, no rub  Abdomen:  Soft, obese, non distended  Extremities: Trace edema, left arm wound VAC in place   Neurologic:  alert, oriented, speech normal   Skin: No acute rashes  Access: Temp cath/ sep 14; infected AVF       Basic Metabolic Panel:   Recent Labs Lab 06/18/15 1420 06/19/15 0622 06/20/15 0212 06/21/15 0548 06/22/15 0447  NA 135 137 139 138 136  K 3.1* 4.1 4.9 3.7 3.8  CL 92* 96* 101 97* 97*  CO2 GLUCOSE 160* 193* 142* 132* 109*  BUN 62* 69* 45* 31* 43*  CREATININE 11.62* 13.12* 11.47* 9.32* 11.80*  CALCIUM 8.9 8.6* 7.8* 8.6* 8.3*     CBC:  Recent Labs Lab 06/17/15 1330  06/18/15 1420 06/19/15 0622 06/20/15 0212 06/21/15 0548 06/22/15 0447  WBC 10.5  < > 10.8* 11.0* 10.9* 11.0* 9.0  NEUTROABS 6.7  --  6.8*  --   --   --   --   HGB  11.9*  < > 11.2* 11.6* 10.6* 9.5* 9.6*  HCT 36.8*  < > 35.4* 36.9* 34.0* 30.1* 29.6*  MCV 105.1*  < > 105.3* 106.8* 108.0* 106.9* 107.1*  PLT 317  < > 307 332 263 308 261  < > = values in this interval not displayed.    Microbiology:  Recent Results (from the past 720 hour(s))  Culture, routine-abscess     Status: None   Collection Time: 06/17/15  1:05 PM  Result Value Ref Range Status   Specimen Description ABSCESS LEFT ARM  Final   Special Requests NONE  Final   Gram Stain   Final    FEW WBC PRESENT,BOTH PMN AND MONONUCLEAR RARE SQUAMOUS EPITHELIAL CELLS PRESENT RARE GRAM POSITIVE COCCI IN PAIRS Performed at [facility]    Culture   Final    MODERATE STAPHYLOCOCCUS AUREUS Note: RIFAMPIN AND GENTAMICIN SHOULD NOT BE USED AS SINGLE DRUGS FOR TREATMENT OF STAPH INFECTIONS. Performed at [facility]    Report Status 06/20/2015 FINAL  Final   Organism ID, Bacteria STAPHYLOCOCCUS AUREUS  Final      Susceptibility   Staphylococcus aureus - MIC*  CLINDAMYCIN <=0.25 SENSITIVE Sensitive     ERYTHROMYCIN <=0.25 SENSITIVE Sensitive     GENTAMICIN <=0.5 SENSITIVE Sensitive     LEVOFLOXACIN 0.25 SENSITIVE Sensitive     OXACILLIN <=0.25 SENSITIVE Sensitive     RIFAMPIN <=0.5 SENSITIVE Sensitive     TRIMETH/SULFA <=10 SENSITIVE Sensitive     VANCOMYCIN 1 SENSITIVE Sensitive     TETRACYCLINE >=16 RESISTANT Resistant     MOXIFLOXACIN <=0.25 SENSITIVE Sensitive     * MODERATE STAPHYLOCOCCUS AUREUS  Culture, blood (routine x 2)     Status: None   Collection Time: 06/17/15  1:20 PM  Result Value Ref Range Status   Specimen Description BLOOD RIGHT ARM  Final   Special Requests BOTTLES DRAWN AEROBIC AND ANAEROBIC  Final   Culture NO GROWTH 5 DAYS  Final   Report Status 06/22/2015 FINAL  Final  Culture, blood (routine x 2)     Status: None   Collection Time: 06/17/15  1:30 PM  Result Value Ref Range Status   Specimen Description BLOOD RIGHT HAND  Final   Special  Requests BOTTLES DRAWN AEROBIC AND ANAEROBIC  Final   Culture NO GROWTH 5 DAYS  Final   Report Status 06/22/2015 FINAL  Final  Body fluid culture     Status: None   Collection Time: 06/18/15  4:38 PM  Result Value Ref Range Status   Specimen Description ABSCESS  Final   Special Requests NONE  Final   Gram Stain   Final    TOO NUMEROUS TO COUNT WBC SEEN MODERATE GRAM POSITIVE COCCI FEW GRAM NEGATIVE RODS    Culture MODERATE GROWTH STAPHYLOCOCCUS AUREUS  Final   Report Status 06/22/2015 FINAL  Final   Organism ID, Bacteria STAPHYLOCOCCUS AUREUS  Final      Susceptibility   Staphylococcus aureus - MIC*    CIPROFLOXACIN <=0.5 SENSITIVE Sensitive     GENTAMICIN <=0.5 SENSITIVE Sensitive     OXACILLIN 0.5 SENSITIVE Sensitive     TRIMETH/SULFA <=10 SENSITIVE Sensitive     CEFOXITIN SCREEN NEGATIVE Sensitive     Inducible Clindamycin NEGATIVE Sensitive     TETRACYCLINE Value in next row Resistant      RESISTANT>=16    * MODERATE GROWTH STAPHYLOCOCCUS AUREUS  MRSA PCR Screening     Status: None   Collection Time: 06/19/15  3:29 PM  Result Value Ref Range Status   MRSA by PCR NEGATIVE NEGATIVE Final    Comment:        The GeneXpert MRSA Assay (FDA approved for NASAL specimens only), is one component of a comprehensive MRSA colonization surveillance program. It is not intended to diagnose MRSA infection nor to guide or monitor treatment for MRSA infections.   Culture, blood (routine x 2)     Status: None (Preliminary result)   Collection Time: 06/19/15  4:00 PM  Result Value Ref Range Status   Specimen Description BLOOD RIGHT ARM  Final   Special Requests BOTTLES DRAWN AEROBIC AND ANAEROBIC  2CC  Final   Culture NO GROWTH 3 DAYS  Final   Report Status PENDING  Incomplete  Culture, blood (routine x 2)     Status: None (Preliminary result)   Collection Time: 06/19/15  4:00 PM  Result Value Ref Range Status   Specimen Description BLOOD RIGHT ARM  Final   Special Requests  BOTTLES DRAWN AEROBIC AND ANAEROBIC  2CC  Final   Culture NO GROWTH 3 DAYS  Final   Report Status PENDING  Incomplete  Coagulation Studies: No results for input(s): LABPROT, INR in the last 72 hours.  Urinalysis: No results for input(s): COLORURINE, LABSPEC, PHURINE, GLUCOSEU, HGBUR, BILIRUBINUR, KETONESUR, PROTEINUR, UROBILINOGEN, NITRITE, LEUKOCYTESUR in the last 72 hours.  Invalid input(s): APPERANCEUR    Imaging: No results found.   Medications:   . norepinephrine Stopped (06/21/15 1805)   . allopurinol  100 mg Oral Q48H  . antiseptic oral rinse  7 mL Mouth Rinse QID  . atorvastatin  10 mg Per Tube Daily  . calcium acetate  2,001 mg Oral TID WC  .  ceFAZolin (ANCEF) IV  1 g Intravenous Q24H  . chlorhexidine gluconate  15 mL Mouth Rinse BID  . epoetin (EPOGEN/PROCRIT) injection  4,000 Units Intravenous Q T,Th,Sa-HD  . gabapentin  300 mg Oral TID  . heparin subcutaneous  5,000 Units Subcutaneous 3 times per day  . insulin aspart  0-20 Units Subcutaneous TID WC  . insulin aspart  0-5 Units Subcutaneous QHS  . insulin glargine  35 Units Subcutaneous QHS  . pantoprazole  40 mg Oral BID AC   acetaminophen **OR** acetaminophen, ondansetron **OR** ondansetron (ZOFRAN) IV, senna-docusate  Assessment/ Plan:  49 y.o. male male with a PMHX of ESRD, Morbid obesity, OSA, Chronic anticoagulation for DVT, SHPTH, was admitted on 06/18/2015 with sepsis, acute resp failure. Excision of infected left arm basilic AV fistula with multiple previously placed stents sep 15  1. ESRD. [facility] [place] [facility] 2. Sepsis from infected AVF. Staph aureus (MSSA). S/p Excision of infected left arm basilic AV fistula with multiple previously placed stents. SEP 15 3. AOCKD 4. SHPTH 5. Acute resp failure  Plan: Broad spectrum ABx as per primary team (ancef), wound vac plan for next dialysis on Monday  EPO with dialysis [facility]    LOS: 5 SINGH,HARMEET 9/18/20169:57 AM

## 2015-06-23 NOTE — Progress Notes (Signed)
[facility]  Daily Progress Note   Subjective: 3 Day Post-Op: Excision of infected left arm basilic AV fistula with multiple previously placed stents, I&D of wound abscess and VAC dressing placement.  4 Days Post-Op: Insertion of temporary dialysis catheter catheter right femoral approach.  Asking when he can leave hospital. States arm is "feeling better." Discussed wound VAC again and how he will need to go home with it and undergo changes every three days. He expressed his understanding. Patient also understands he will need a permcath placed for dialysis access as he no longer has access in his arm. He expressed his understanding. He understands this will happen possibly on Tuesday.   Objective: Filed Vitals:   06/22/15 1930 06/22/15 1945 06/22/15 2000 06/22/15 2150  BP: 113/72 116/60 98/63 94/56   Pulse: 98 96  98  Temp:   98.9 F (37.2 C) 98.4 F (36.9 C)  TempSrc:   Oral Oral  Resp: Height:      Weight:      SpO2:   94% 98%    Intake/Output Summary (Last 24 hours) at 06/23/15 1235 Last data filed at 06/23/15 0900  Gross per 24 hour  Intake    480 ml  Output   1570 ml  Net  -1090 ml    Physical Exam: A&Ox3, NAD CV: RRR Pulmonary: CTA Bilaterally Left Upper Extremity:  First VAC Dressing Change - patient given  morphine IV for comfort during VAC change. Sponge removed to reveal good blood flow to wound (bleeding), granulation tissue forming, no purulent discharge or necrotic tissue noted. New sponge placed and plastic dressing arranged so it does not cover two adjacent incisions. VAC back to suction with no leak. Patient tolerated without any issue. Incisions: clean dry and intact, minimal swelling, minimal erythema, pulses intact. Abdomen: Soft, Nontender, Nondistended Right Groin: Catheter intact, no draining or signs infection, working appropriately   Laboratory: CBC    Component Value Date/Time   WBC 9.0 06/22/2015 0447   WBC 7.3 04/24/2014 1434   HGB 9.6* 06/22/2015 0447   HGB 8.5* 04/24/2014 1434   HCT 29.6* 06/22/2015 0447   HCT 26.9* 04/24/2014 1434   PLT 261 06/22/2015 0447   PLT 309 04/24/2014 1434    BMET    Component Value Date/Time   NA 136 06/22/2015 0447   K 3.8 06/22/2015 0447   K 3.6 04/24/2014 1434   CL 97* 06/22/2015 0447   CO2 28 06/22/2015 0447   GLUCOSE 109* 06/22/2015 0447   BUN 43* 06/22/2015 0447   CREATININE 11.80* 06/22/2015 0447   CALCIUM 8.3* 06/22/2015 0447   GFRNONAA 4* 06/22/2015 0447   GFRAA 5* 06/22/2015 0447    Assessment/Planning: 49 year old male s/p excision of infected left arm basilic AV fistula with multiple previously placed stents, I&D of wound abscess and VAC dressing placement. 1) VAC changes every three days by nursing. 2) Continue to use temp dialysis catheter for dialysis 3) OK for PT with temp dialysis catheter 4) Permcath placement on Tuesday with Dr. Wyn Quaker 5) Care as per primary team  [facility] PA-C 06/23/2015 12:35 PM

## 2015-06-23 NOTE — Progress Notes (Signed)
Patient finally agreed to go on bipap. He could not tolerate pressure setting on 15/10. He would not wear full facemask. Agreed to wear nasal mask. bipap settings 10/5 40%.

## 2015-06-23 NOTE — Progress Notes (Signed)
Patient requested to have his privacy while bathing and using the toilet. Educated about safety measure that staff has to be within arm length to avoid fall. Patient insisted that staff to leave while bathing and toileting assisted by a friend of his. Patient agreed to call staff once bathing and toileting done so staff can assist him transfer back to wheelchair,then to bed.

## 2015-06-23 NOTE — Progress Notes (Signed)
Per patient

## 2015-06-23 NOTE — Progress Notes (Signed)
ANTIBIOTIC CONSULT NOTE - follow-up  Pharmacy Consult for Cefazolin Indication: fistula abscess  Allergies  Allergen Reactions  . Aspirin Other (See Comments)    Reaction:  GI upset     Patient Measurements: Height:  (162.6 cm) Weight: 217 lb 2.5 oz (98.5 kg) IBW/kg (Calculated) : 59.2 Adjusted Body Weight: 123 kg  Vital Signs: Temp: 98 F (36.7 C) (09/18 1258) Temp Source: Oral (09/18 1258) BP: 126/85 mmHg (09/18 1258) Pulse Rate: 96 (09/18 1258) Intake/Output from previous day: 09/17 0701 - 09/18 0700 In: -  Out: 1390  Intake/Output from this shift: Total I/O In: 480 [P.O.:480] Out: 180 [Urine:180]  Labs:  Recent Labs  06/21/15 0548 06/22/15 0447  WBC 11.0* 9.0  HGB 9.5* 9.6*  PLT 308 261  CREATININE 9.32* 11.80*   Estimated Creatinine Clearance: 8.1 mL/min (by C-G formula based on Cr of 11.8). No results for input(s): VANCOTROUGH, VANCOPEAK, VANCORANDOM, GENTTROUGH, GENTPEAK, GENTRANDOM, TOBRATROUGH, TOBRAPEAK, TOBRARND, AMIKACINPEAK, AMIKACINTROU, AMIKACIN in the last 72 hours.   Microbiology: Recent Results (from the past 720 hour(s))  Culture, routine-abscess     Status: None   Collection Time: 06/17/15  1:05 PM  Result Value Ref Range Status   Specimen Description ABSCESS LEFT ARM  Final   Special Requests NONE  Final   Gram Stain   Final    FEW WBC PRESENT,BOTH PMN AND MONONUCLEAR RARE SQUAMOUS EPITHELIAL CELLS PRESENT RARE GRAM POSITIVE COCCI IN PAIRS Performed at [facility]    Culture   Final    MODERATE STAPHYLOCOCCUS AUREUS Note: RIFAMPIN AND GENTAMICIN SHOULD NOT BE USED AS SINGLE DRUGS FOR TREATMENT OF STAPH INFECTIONS. Performed at [facility]    Report Status 06/20/2015 FINAL  Final   Organism ID, Bacteria STAPHYLOCOCCUS AUREUS  Final      Susceptibility   Staphylococcus aureus - MIC*    CLINDAMYCIN <=0.25 SENSITIVE Sensitive     ERYTHROMYCIN <=0.25 SENSITIVE Sensitive     GENTAMICIN <=0.5 SENSITIVE  Sensitive     LEVOFLOXACIN 0.25 SENSITIVE Sensitive     OXACILLIN <=0.25 SENSITIVE Sensitive     RIFAMPIN <=0.5 SENSITIVE Sensitive     TRIMETH/SULFA <=10 SENSITIVE Sensitive     VANCOMYCIN 1 SENSITIVE Sensitive     TETRACYCLINE >=16 RESISTANT Resistant     MOXIFLOXACIN <=0.25 SENSITIVE Sensitive     * MODERATE STAPHYLOCOCCUS AUREUS  Culture, blood (routine x 2)     Status: None   Collection Time: 06/17/15  1:20 PM  Result Value Ref Range Status   Specimen Description BLOOD RIGHT ARM  Final   Special Requests BOTTLES DRAWN AEROBIC AND ANAEROBIC  Final   Culture NO GROWTH 5 DAYS  Final   Report Status 06/22/2015 FINAL  Final  Culture, blood (routine x 2)     Status: None   Collection Time: 06/17/15  1:30 PM  Result Value Ref Range Status   Specimen Description BLOOD RIGHT HAND  Final   Special Requests BOTTLES DRAWN AEROBIC AND ANAEROBIC  Final   Culture NO GROWTH 5 DAYS  Final   Report Status 06/22/2015 FINAL  Final  Body fluid culture     Status: None   Collection Time: 06/18/15  4:38 PM  Result Value Ref Range Status   Specimen Description ABSCESS  Final   Special Requests NONE  Final   Gram Stain   Final    TOO NUMEROUS TO COUNT WBC SEEN MODERATE GRAM POSITIVE COCCI FEW GRAM NEGATIVE RODS    Culture MODERATE  GROWTH STAPHYLOCOCCUS AUREUS  Final   Report Status 06/22/2015 FINAL  Final   Organism ID, Bacteria STAPHYLOCOCCUS AUREUS  Final      Susceptibility   Staphylococcus aureus - MIC*    CIPROFLOXACIN <=0.5 SENSITIVE Sensitive     GENTAMICIN <=0.5 SENSITIVE Sensitive     OXACILLIN 0.5 SENSITIVE Sensitive     TRIMETH/SULFA <=10 SENSITIVE Sensitive     CEFOXITIN SCREEN NEGATIVE Sensitive     Inducible Clindamycin NEGATIVE Sensitive     TETRACYCLINE Value in next row Resistant      RESISTANT>=16    * MODERATE GROWTH STAPHYLOCOCCUS AUREUS  MRSA PCR Screening     Status: None   Collection Time: 06/19/15  3:29 PM  Result Value Ref Range Status   MRSA by PCR  NEGATIVE NEGATIVE Final    Comment:        The GeneXpert MRSA Assay (FDA approved for NASAL specimens only), is one component of a comprehensive MRSA colonization surveillance program. It is not intended to diagnose MRSA infection nor to guide or monitor treatment for MRSA infections.   Culture, blood (routine x 2)     Status: None (Preliminary result)   Collection Time: 06/19/15  4:00 PM  Result Value Ref Range Status   Specimen Description BLOOD RIGHT ARM  Final   Special Requests BOTTLES DRAWN AEROBIC AND ANAEROBIC  2CC  Final   Culture NO GROWTH 4 DAYS  Final   Report Status PENDING  Incomplete  Culture, blood (routine x 2)     Status: None (Preliminary result)   Collection Time: 06/19/15  4:00 PM  Result Value Ref Range Status   Specimen Description BLOOD RIGHT ARM  Final   Special Requests BOTTLES DRAWN AEROBIC AND ANAEROBIC  2CC  Final   Culture NO GROWTH 4 DAYS  Final   Report Status PENDING  Incomplete    Medical History: Past Medical History  Diagnosis Date  . Type 2 diabetes mellitus, uncontrolled        . Morbid obesity   . OSA (obstructive sleep apnea)   . Septic shock(785.52)   . Anemia   . Hypertension   . Seizures   . Gout   . ESRD (end stage renal disease)   . LOC (loss of consciousness)   . DVT (deep venous thrombosis) 2011  . Cardiomegaly   . Chronic anticoagulation   . Chronic pancreatitis   . Secondary hyperparathyroidism     Medications:  Prescriptions prior to admission  Medication Sig Dispense Refill Last Dose  . allopurinol (ZYLOPRIM) 300 MG tablet Take 300 mg by mouth 2 (two) times daily.     . cinacalcet (SENSIPAR) 60 MG tablet Take 60 mg by mouth daily.   06/17/2015 at Unknown time  . clonazePAM (KLONOPIN) 1 MG tablet Take 1 mg by mouth 2 (two) times daily.   06/17/2015 at Unknown time  . colchicine 0.6 MG tablet Take 0.6 mg by mouth daily.   06/17/2015 at Unknown time  . gabapentin (NEURONTIN) 300 MG capsule Take 300 mg by mouth every  8 (eight) hours.   06/17/2015 at Unknown time  . hydrOXYzine (ATARAX/VISTARIL) 25 MG tablet Take 25 mg by mouth 3 (three) times daily as needed.   06/17/2015 at Unknown time  . insulin aspart protamine-insulin aspart (NOVOLOG 70/30) (70-30) 100 UNIT/ML injection Inject 35 Units into the skin 3 (three) times daily with meals.    06/17/2015 at Unknown time  . insulin glargine (LANTUS) 100 UNIT/ML injection Inject 70 Units  into the skin at bedtime.    06/16/2015 at Unknown time  . lanthanum (FOSRENOL) 1000 MG chewable tablet Chew 1,000 mg by mouth 2 (two) times daily with a meal.     . multivitamin (RENA-VIT) TABS tablet Take 1 tablet by mouth daily.   3   . omeprazole (PRILOSEC) 20 MG capsule Take 20 mg by mouth 2 (two) times daily before a meal.    06/17/2015 at Unknown time  . Oxycodone HCl 10 MG TABS Take 10 mg by mouth every 6 (six) hours as needed (pain).    Past Week at Unknown time  . warfarin (COUMADIN) 7.5 MG tablet Take 7.5 mg by mouth See admin instructions. Pt takes 7.5mg  on Tuesday, Wednesday, Thursday, Saturday and Sunday and  on Monday and Friday   06/17/2015 at Unknown time  . amLODipine (NORVASC) 5 MG tablet Take 5 mg by mouth daily.   Not Taking at Unknown time  . atorvastatin (LIPITOR) 10 MG tablet Take 10 mg by mouth daily.   Not Taking at Unknown time  . calcium acetate (PHOSLO) 667 MG capsule Take 2,001 mg by mouth 3 (three) times daily with meals.    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 (Patient was on vancomycin and Zosyn).  Abscess cx growing MSSA. Hemodialysis patient.  Plan:  Will continue cefazolin 1 g iv q 24 hours (HD dosing)  Bari Mantis PharmD Clinical Pharmacist 06/23/2015 2:52 PM

## 2015-06-24 ENCOUNTER — Inpatient Hospital Stay: Payer: Medicare Other

## 2015-06-24 DIAGNOSIS — J9601 Acute respiratory failure with hypoxia: Secondary | ICD-10-CM

## 2015-06-24 LAB — GLUCOSE, CAPILLARY
GLUCOSE-CAPILLARY: 90 mg/dL (ref 65–99)
Glucose-Capillary: 151 mg/dL — ABNORMAL HIGH (ref 65–99)
Glucose-Capillary: 143 mg/dL — ABNORMAL HIGH (ref 65–99)
Glucose-Capillary: 132 mg/dL — ABNORMAL HIGH (ref 65–99)

## 2015-06-24 LAB — CBC
HEMATOCRIT: 29 % — AB (ref 40.0–52.0)
Hemoglobin: 9.3 g/dL — ABNORMAL LOW (ref 13.0–18.0)
MCH: 33.9 pg (ref 26.0–34.0)
MCHC: 32.1 g/dL (ref 32.0–36.0)
MCV: 105.8 fL — ABNORMAL HIGH (ref 80.0–100.0)
PLATELETS: 234 10*3/uL (ref 150–440)
RBC: 2.74 MIL/uL — ABNORMAL LOW (ref 4.40–5.90)
RDW: 17.6 % — AB (ref 11.5–14.5)
WBC: 9 10*3/uL (ref 3.8–10.6)

## 2015-06-24 LAB — RENAL FUNCTION PANEL
ALBUMIN: 2.9 g/dL — AB (ref 3.5–5.0)
Anion gap: 12 (ref 5–15)
BUN: 47 mg/dL — AB (ref 6–20)
CALCIUM: 8.6 mg/dL — AB (ref 8.9–10.3)
CO2: 29 mmol/L (ref 22–32)
CREATININE: 13.93 mg/dL — AB (ref 0.61–1.24)
Chloride: 93 mmol/L — ABNORMAL LOW (ref 101–111)
GFR calc Af Amer: 4 mL/min — ABNORMAL LOW (ref >60)
GFR, EST NON AFRICAN AMERICAN: 4 mL/min — AB (ref >60)
GLUCOSE: 164 mg/dL — AB (ref 65–99)
PHOSPHORUS: 6 mg/dL — AB (ref 2.5–4.6)
POTASSIUM: 3.8 mmol/L (ref 3.5–5.1)
SODIUM: 134 mmol/L — AB (ref 135–145)

## 2015-06-24 LAB — CULTURE, BLOOD (ROUTINE X 2)
CULTURE: NO GROWTH
CULTURE: NO GROWTH

## 2015-06-24 LAB — SURGICAL PATHOLOGY

## 2015-06-24 MED ORDER — SODIUM CHLORIDE 0.9 % IV SOLN
INTRAVENOUS | Status: DC
  Administered 2015-06-24: 21:00:00 via INTRAVENOUS

## 2015-06-24 MED ORDER — DEXTROSE 5 % IV SOLN
1.5000 g | INTRAVENOUS | Status: DC
  Filled 2015-06-24: qty 1.5

## 2015-06-24 MED ORDER — CHLORHEXIDINE GLUCONATE CLOTH 2 % EX PADS
6.0000 | MEDICATED_PAD | Freq: Once | CUTANEOUS | Status: AC
  Administered 2015-06-24: 6 via TOPICAL

## 2015-06-24 NOTE — Progress Notes (Signed)
[facility]     Assessment and Plan:  Obstructive sleep apnea. -Patient admits that he has not been poorly compliant with his home CPAP use, mostly because he is had difficulty in tolerating a full facial mask. The patient is currently using BiPAP here in the hospital, using a nasal mask, which he appears to tolerate well. -The patient is asked to continue his home CPAP and may continue with a nasal mask. -We'll have the patient follow-up outpatient for further sleep evaluation. At that time can reevaluate his tolerance of CPAP and troubleshoot accordingly. Would also consider checking a auto titration test, an overnight oximetry on an outpatient basis. -Follow-up in 2-4 weeks in office.  LUE AVF abscess.  -Doing better, wound VAC in place.  Encephalopathy.  -Resolved.  Respiratory failure.  -Resolved.  The patient appears stable from a stress standpoint for discharge, we will sign off for now. Please call with any questions or concerns.  Date: 06/24/2015  MRN# [id number] Shane Hamilton July 04, 1966   [name] is a 49 y.o. old male seen in follow up for chief complaint of  Chief Complaint  Patient presents with  . infection of fistula    49 y.o. male male with a PMHX of ESRD, Morbid obesity, OSA, Chronic anticoagulation for DVT, SHPTH, was admitted on 06/18/2015 with sepsis, acute resp failure. Excision of infected left arm basilic AV fistula with multiple previously placed stents   HPI:   Patient doing well today. No new complaints. He has been tolerating his CPAP with a nasal mask well. His breathing feels normal.  No flowsheet data found.  Pulmonary Functions Testing Results:  No results found for: FEV1, FVC, FEV1FVC, TLC, DLCO   Medication:  reviewed No current outpatient prescriptions on file.   Allergies:  Aspirin  Review of Systems: Gen:  Denies  fever, sweats. HEENT: Denies blurred vision. Cvc:  No dizziness, chest pain or  heaviness Resp:   Denies cough or sputum porduction. Gi: Denies swallowing difficulty, stomach pain. constipation, bowel incontinence Gu:  Denies bladder incontinence, burning urine Ext:   No Joint pain, stiffness. Skin: No skin rash, easy bruising. Endoc:  No polyuria, polydipsia. Psych: No depression, insomnia. Other:  All other systems were reviewed and found to be negative other than what is mentioned in the HPI.   Physical Examination:   VS: BP 115/64 mmHg  Pulse 93  Temp(Src) 98 F (36.7 C) (Oral)  Resp 23  Ht  (1.626 m)  Wt 101.2 kg (223 lb 1.7 oz)  BMI 38.28 kg/m2  SpO2 100%  General Appearance: No distress  Neuro:without focal findings,  speech normal,  HEENT: PERRLA, EOM intact. Pulmonary: normal breath sounds, No wheezing.   CardiovascularNormal S1,S2.  No m/r/g.   Abdomen: Benign, Soft, non-tender. Renal:  No costovertebral tenderness  GU:  Not performed at this time. Endoc: No evident thyromegaly, no signs of acromegaly. Skin:   warm, no rash. Extremities: normal, no cyanosis, clubbing.   LABORATORY PANEL:   CBC  Recent Labs Lab 06/24/15 1030  WBC 9.0  HGB 9.3*  HCT 29.0*  PLT 234   ------------------------------------------------------------------------------------------------------------------  Chemistries   Recent Labs Lab 06/24/15 1030  NA 134*  K 3.8  CL 93*  CO2 29  GLUCOSE 164*  BUN 47*  CREATININE 13.93*  CALCIUM 8.6*   ------------------------------------------------------------------------------------------------------------------  Cardiac Enzymes No results for input(s): TROPONINI in the last 168 hours. ------------------------------------------------------------  RADIOLOGY:   No results found for this or  any previous visit. Results for orders placed during the hospital encounter of 06/18/15  DG Chest 2 View   Narrative CLINICAL DATA:  Follow-up pulmonary edema. Morbid obesity. Obstructive sleep apnea. End-stage  renal disease on hemodialysis.  EXAM: CHEST  2 VIEW  COMPARISON:  06/21/2015 chest radiograph.  FINDINGS: Stable cardiomediastinal silhouette with mild cardiomegaly and mildly prominent main pulmonary artery contour. No pneumothorax. No pleural effusion. There is mild residual pulmonary edema, significantly decreased. No new focal lung opacity.  IMPRESSION: 1. Stable mild cardiomegaly. Mild residual pulmonary edema, significantly decreased. 2. Mildly prominent main pulmonary artery contour, which could indicate pulmonary hypertension.   Electronically Signed   By: Delbert Phenix M.D.   On: 06/24/2015 14:09    ------------------------------------------------------------------------------------------------------------------  Thank  you for allowing [facility] to assist in the care of your patient. Our recommendations are noted above.  Please contact us if we can be of further service.   Wells Guiles, MD.  [facility]   Santiago Glad, M.D.  Stephanie Acre, M.D.  Billy Fischer, M.D

## 2015-06-24 NOTE — Care Management Note (Signed)
Patient is active at [facility] MWF and S schedule.  I have sent admission records and will update with additional records at discharge.  Ivor Reining Dialysis Liaison  [phone]

## 2015-06-24 NOTE — Progress Notes (Signed)
Subjective:  Patient seen during HD. Using right femoral dialysis catheter.  Tolerating HD well this AM.    Objective:  Vital signs in last 24 hours:  Temp:  [98 F (36.7 C)-99 F (37.2 C)] 98.7 F (37.1 C) (09/19 0930) Pulse Rate:  [87-99] 93 (09/19 1100) Resp:  [17-22] 17 (09/19 1100) BP: (100-138)/(57-103) 100/57 mmHg (09/19 1100) SpO2:  [87 %-100 %] 98 % (09/19 1100) Weight:  [101.2 kg (223 lb 1.7 oz)] 101.2 kg (223 lb 1.7 oz) (09/19 0930)  Weight change:  Filed Weights   06/20/15 1524 06/21/15 0000 06/24/15 0930  Weight: 200.1 kg (441 lb 2.3 oz) 98.5 kg (217 lb 2.5 oz) 101.2 kg (223 lb 1.7 oz)    Intake/Output:    Intake/Output Summary (Last 24 hours) at 06/24/15 1126 Last data filed at 06/24/15 0602  Gross per 24 hour  Intake    600 ml  Output      0 ml  Net    600 ml     Physical Exam: General: obese  HEENT  anicteric, moist oral mucous membranes   Neck supple  Pulm/lungs  CTAB normal effort  CVS/Heart Regular, no rub  Abdomen:  Soft, obese, non distended  Extremities: Trace edema  Neurologic:  alert, oriented, follows commands  Skin: No acute rashes  Access: Temp HD cath R groin/ sep 14; infected AVF       Basic Metabolic Panel:   Recent Labs Lab 06/19/15 0622 06/20/15 0212 06/21/15 0548 06/22/15 0447 06/24/15 1030  NA 137 139 138 136 134*  K 4.1 4.9 3.7 3.8 3.8  CL 96* 101 97* 97* 93*  CO2 GLUCOSE 193* 142* 132* 109* 164*  BUN 69* 45* 31* 43* 47*  CREATININE 13.12* 11.47* 9.32* 11.80* 13.93*  CALCIUM 8.6* 7.8* 8.6* 8.3* 8.6*  PHOS  --   --   --   --  6.0*     CBC:  Recent Labs Lab 06/17/15 1330  06/18/15 1420 06/19/15 0622 06/20/15 0212 06/21/15 0548 06/22/15 0447 06/24/15 1030  WBC 10.5  < > 10.8* 11.0* 10.9* 11.0* 9.0 9.0  NEUTROABS 6.7  --  6.8*  --   --   --   --   --   HGB 11.9*  < > 11.2* 11.6* 10.6* 9.5* 9.6* 9.3*  HCT 36.8*  < > 35.4* 36.9* 34.0* 30.1* 29.6* 29.0*  MCV 105.1*  < > 105.3* 106.8*  108.0* 106.9* 107.1* 105.8*  PLT 317  < > 307 332 263 308 261 234  < > = values in this interval not displayed.    Microbiology:  Recent Results (from the past 720 hour(s))  Culture, routine-abscess     Status: None   Collection Time: 06/17/15  1:05 PM  Result Value Ref Range Status   Specimen Description ABSCESS LEFT ARM  Final   Special Requests NONE  Final   Gram Stain   Final    FEW WBC PRESENT,BOTH PMN AND MONONUCLEAR RARE SQUAMOUS EPITHELIAL CELLS PRESENT RARE GRAM POSITIVE COCCI IN PAIRS Performed at [facility]    Culture   Final    MODERATE STAPHYLOCOCCUS AUREUS Note: RIFAMPIN AND GENTAMICIN SHOULD NOT BE USED AS SINGLE DRUGS FOR TREATMENT OF STAPH INFECTIONS. Performed at [facility]    Report Status 06/20/2015 FINAL  Final   Organism ID, Bacteria STAPHYLOCOCCUS AUREUS  Final      Susceptibility   Staphylococcus aureus - MIC*    CLINDAMYCIN <=0.25  SENSITIVE Sensitive     ERYTHROMYCIN <=0.25 SENSITIVE Sensitive     GENTAMICIN <=0.5 SENSITIVE Sensitive     LEVOFLOXACIN 0.25 SENSITIVE Sensitive     OXACILLIN <=0.25 SENSITIVE Sensitive     RIFAMPIN <=0.5 SENSITIVE Sensitive     TRIMETH/SULFA <=10 SENSITIVE Sensitive     VANCOMYCIN 1 SENSITIVE Sensitive     TETRACYCLINE >=16 RESISTANT Resistant     MOXIFLOXACIN <=0.25 SENSITIVE Sensitive     * MODERATE STAPHYLOCOCCUS AUREUS  Culture, blood (routine x 2)     Status: None   Collection Time: 06/17/15  1:20 PM  Result Value Ref Range Status   Specimen Description BLOOD RIGHT ARM  Final   Special Requests BOTTLES DRAWN AEROBIC AND ANAEROBIC  Final   Culture NO GROWTH 5 DAYS  Final   Report Status 06/22/2015 FINAL  Final  Culture, blood (routine x 2)     Status: None   Collection Time: 06/17/15  1:30 PM  Result Value Ref Range Status   Specimen Description BLOOD RIGHT HAND  Final   Special Requests BOTTLES DRAWN AEROBIC AND ANAEROBIC  Final   Culture NO GROWTH 5 DAYS  Final   Report Status  06/22/2015 FINAL  Final  Body fluid culture     Status: None   Collection Time: 06/18/15  4:38 PM  Result Value Ref Range Status   Specimen Description ABSCESS  Final   Special Requests NONE  Final   Gram Stain   Final    TOO NUMEROUS TO COUNT WBC SEEN MODERATE GRAM POSITIVE COCCI FEW GRAM NEGATIVE RODS    Culture MODERATE GROWTH STAPHYLOCOCCUS AUREUS  Final   Report Status 06/22/2015 FINAL  Final   Organism ID, Bacteria STAPHYLOCOCCUS AUREUS  Final      Susceptibility   Staphylococcus aureus - MIC*    CIPROFLOXACIN <=0.5 SENSITIVE Sensitive     GENTAMICIN <=0.5 SENSITIVE Sensitive     OXACILLIN 0.5 SENSITIVE Sensitive     TRIMETH/SULFA <=10 SENSITIVE Sensitive     CEFOXITIN SCREEN NEGATIVE Sensitive     Inducible Clindamycin NEGATIVE Sensitive     TETRACYCLINE Value in next row Resistant      RESISTANT>=16    * MODERATE GROWTH STAPHYLOCOCCUS AUREUS  MRSA PCR Screening     Status: None   Collection Time: 06/19/15  3:29 PM  Result Value Ref Range Status   MRSA by PCR NEGATIVE NEGATIVE Final    Comment:        The GeneXpert MRSA Assay (FDA approved for NASAL specimens only), is one component of a comprehensive MRSA colonization surveillance program. It is not intended to diagnose MRSA infection nor to guide or monitor treatment for MRSA infections.   Culture, blood (routine x 2)     Status: None   Collection Time: 06/19/15  4:00 PM  Result Value Ref Range Status   Specimen Description BLOOD RIGHT ARM  Final   Special Requests BOTTLES DRAWN AEROBIC AND ANAEROBIC  2CC  Final   Culture NO GROWTH 5 DAYS  Final   Report Status 06/24/2015 FINAL  Final  Culture, blood (routine x 2)     Status: None   Collection Time: 06/19/15  4:00 PM  Result Value Ref Range Status   Specimen Description BLOOD RIGHT ARM  Final   Special Requests BOTTLES DRAWN AEROBIC AND ANAEROBIC  2CC  Final   Culture NO GROWTH 5 DAYS  Final   Report Status 06/24/2015 FINAL  Final    Coagulation  Studies:  No results for input(s): LABPROT, INR in the last 72 hours.  Urinalysis: No results for input(s): COLORURINE, LABSPEC, PHURINE, GLUCOSEU, HGBUR, BILIRUBINUR, KETONESUR, PROTEINUR, UROBILINOGEN, NITRITE, LEUKOCYTESUR in the last 72 hours.  Invalid input(s): APPERANCEUR    Imaging: No results found.   Medications:     . allopurinol  100 mg Oral Q48H  . antiseptic oral rinse  7 mL Mouth Rinse QID  . atorvastatin  10 mg Per Tube Daily  . calcium acetate  2,001 mg Oral TID WC  .  ceFAZolin (ANCEF) IV  1 g Intravenous Q24H  . chlorhexidine gluconate  15 mL Mouth Rinse BID  . epoetin (EPOGEN/PROCRIT) injection  4,000 Units Intravenous Q T,Th,Sa-HD  . gabapentin  300 mg Oral TID  . heparin subcutaneous  5,000 Units Subcutaneous 3 times per day  . insulin aspart  0-20 Units Subcutaneous TID WC  . insulin aspart  0-5 Units Subcutaneous QHS  . insulin glargine  35 Units Subcutaneous QHS  . pantoprazole  40 mg Oral BID AC  . polyethylene glycol  17 g Oral Daily   acetaminophen **OR** acetaminophen, ondansetron **OR** ondansetron (ZOFRAN) IV, senna-docusate  Assessment/ Plan:  49 y.o. male male with a PMHX of ESRD, Morbid obesity, OSA, Chronic anticoagulation for DVT, SHPTH, was admitted on 06/18/2015 with sepsis, acute resp failure. Excision of infected left arm basilic AV fistula with multiple previously placed stents sep 15  1. ESRD. [facility] [place] [facility] Patient seen during HD, tolerating well, complete HD today, next HD on Wednesday if still here.  2. Sepsis from infected AVF. Staph aureus (MSSA). S/p Excision of infected left arm basilic AV fistula with multiple previously placed stents. SEP 15. Using temporary dialysis catheter in right groin at present, permcath hopefully tomorrow.  Blood cultures from 06/19/15 negative. Continue cefazolin.  3. AOCKD:  Hgb 9.3, continue epogen 4000 units IV with HD.  4. SHPTH: phos 6 and expected to be high on Monday,  continue phoslo  po tid/wm.  5. Acute resp failure: resolved.      LOS: 6 Shane Hamilton, Shane Hamilton 9/19/201611:26 AM

## 2015-06-24 NOTE — Care Management Important Message (Signed)
Important Message  Patient Details  Name: Shane Hamilton MRN: [id number] Date of Birth: [birth year]   Medicare Important Message Given:  Yes-third notification given    Verita Schneiders Allmond 06/24/2015, 2:08 PM

## 2015-06-24 NOTE — Progress Notes (Signed)
Pt refuses to wear bedalarm. Instructed on safety issues, verbalized understanding.

## 2015-06-24 NOTE — Progress Notes (Signed)
Patient on bipap tolerating fairly well. 10/5 40% rr 14. Attempted to increase pressure per md order however patient stated such was too much therefore decreased back to 10/5

## 2015-06-24 NOTE — Progress Notes (Signed)
[facility] -  at [facility]   PATIENT NAME: Shane Hamilton    MR#:  [id number]  DATE OF BIRTH:  1966-02-15  SUBJECTIVE:  CHIEF COMPLAINT:   Chief Complaint  Patient presents with  . infection of fistula    - Patient doing well and extubated 06/21/15 morning. Currently on 2-3 L nasal cannula. - Patient completely alert and oriented -Infected AV fistula excised  06/20/15 - no new complains. - likely Permacath placement tomorrow.  REVIEW OF SYSTEMS:  Review of Systems  Constitutional: Negative for fever and chills.  HENT: Negative for ear discharge, ear pain and nosebleeds.   Respiratory: Negative for cough, shortness of breath and wheezing.   Cardiovascular: Negative for chest pain and palpitations.  Gastrointestinal: Negative for nausea, vomiting, abdominal pain, diarrhea and constipation.  Genitourinary: Negative for dysuria and urgency.  Neurological: Negative for dizziness, sensory change, speech change, focal weakness, seizures and headaches.  Psychiatric/Behavioral: Negative for depression.    DRUG ALLERGIES:   Allergies  Allergen Reactions  . Aspirin Other (See Comments)    Reaction:  GI upset     VITALS:  Blood pressure 115/64, pulse 93, temperature 98 F (36.7 C), temperature source Oral, resp. rate 23, height  (1.626 m), weight 101.2 kg (223 lb 1.7 oz), SpO2 100 %.  PHYSICAL EXAMINATION:  Physical Exam  GENERAL:  49 y.o.-year-old morbidly obese patient sitting in the bed EYES: Pupils equal, round, reactive to light and accommodation. Proptosis of the eyes noted. No scleral icterus.  extraocular movements intact  HEENT: Head atraumatic, normocephalic. Oropharynx and nasopharynx clear.  NECK:  Neck is short and thick. Supple, no jugular venous distention. No thyroid enlargement, no tenderness.  LUNGS: Decreased bilateral breath sounds, no wheezes or crackles heard.. No use of accessory muscles of respiration. CARDIOVASCULAR:  S1, S2 normal. No murmurs, rubs, or gallops.  ABDOMEN: Obese abdomen. Soft, nontender, nondistended. Bowel sounds present. No organomegaly or mass.  EXTREMITIES: 1+ pedal edema present, no cyanosis, or clubbing.  Right hand first 2 digits indicated traumatic contracture injury. Left arm AV fistula excision  done yesterday. Dressing in place NEUROLOGIC:  Cranial nerves intact, no focal motor or sensory deficits. Able to move all extremities and sensation intact PSYCHIATRIC: The patient is alert and oriented 3  SKIN: No obvious rash, lesion, or ulcer.    LABORATORY PANEL:   CBC  Recent Labs Lab 06/24/15 1030  WBC 9.0  HGB 9.3*  HCT 29.0*  PLT 234   ------------------------------------------------------------------------------------------------------------------  Chemistries   Recent Labs Lab 06/24/15 1030  NA 134*  K 3.8  CL 93*  CO2 29  GLUCOSE 164*  BUN 47*  CREATININE 13.93*  CALCIUM 8.6*   ------------------------------------------------------------------------------------------------------------------  Cardiac Enzymes No results for input(s): TROPONINI in the last 168 hours. ------------------------------------------------------------------------------------------------------------------  RADIOLOGY:  Dg Chest 2 View  06/24/2015   CLINICAL DATA:  Follow-up pulmonary edema. Morbid obesity. Obstructive sleep apnea. End-stage renal disease on hemodialysis.  EXAM: CHEST  2 VIEW  COMPARISON:  06/21/2015 chest radiograph.  FINDINGS: Stable cardiomediastinal silhouette with mild cardiomegaly and mildly prominent main pulmonary artery contour. No pneumothorax. No pleural effusion. There is mild residual pulmonary edema, significantly decreased. No new focal lung opacity.  IMPRESSION: 1. Stable mild cardiomegaly. Mild residual pulmonary edema, significantly decreased. 2. Mildly prominent main pulmonary artery contour, which could indicate pulmonary hypertension.    Electronically Signed   By: Delbert Phenix M.D.   On: 06/24/2015 14:09    EKG:  No orders found  for this or any previous visit.  ASSESSMENT AND PLAN:   49 year old morbidly obese male with past medical history significant for sleep apnea, end-stage renal disease, diabetes mellitus and chronic DVT presents secondary to AV fistula abscess. Also went into acute hypercarbic hypoxic respiratory failure.  #1 Acute respiratory failure-hypoxic and hypercarbic respiratory failure - Failed BiPAP and had to be intubated. Appreciate  pulmonary consult. - extubated 9/16 morning. Back on 2 L oxygen which is his chronic home O2  - at night need CPAP due to sleep apnea.  #2 Sepsis and AV fistula infection/abscess-blood cultures from [facility] from 06/17/2015 are negative so far - Wound cultures by aspirating his fistula on admission growing MSSA - appreciate ID consult,  on ancef for 4-6 weeks with hemodialysis- follow with ID clinic in 4 weeks. - AV fistula excised 06/20/15  likely permacath tomorrow- then can be discharged. - will need a visiting nurse at home to help with wound vac and dressing.  #3 End-stage renal disease-on 4 times a week dialysis. -On Monday, Tuesday, Thursday and Saturday schedule.  - AV fistula infection with MSSA abscess - temp dialysis catheter placed, s/p excision of AV fistula 06/20/15  - will need a permacath prior to discharge- likely placement tomorrow -Appreciate vascular and nephrology consults  #4 hypotension-post intubation and while on sedation. - Held blood pressure medications for now. Required Levophed drip- tapered off . - also sepsis from AV graft infection  #5 diabetes mellitus-Lantus dose is decreased to half as patient is nothing by mouth and SLIDING SCALE INSULIN.  #6 DVT prophylaxis-subcutaneous heparin  #7 GI prophylaxis-on Protonix  All the records are reviewed and case discussed with Care Management/Social Workerr. Management plans  discussed with the patient, family and they are in agreement.  CODE STATUS: Full code  TOTAL  TIME SPENT IN TAKING CARE OF THIS PATIENT: 35 minutes.   POSSIBLE D/C IN 3 DAYS,after permacath placement,  DEPENDING ON CLINICAL CONDITION.   Altamese Dilling M.D on 06/24/2015 at 8:50 PM  Between 7am to 6pm - Pager - [phone]  After 6pm go to [email or web address] - password [email or web address] [facility]  Office  [phone]  CC: Primary care physician; Darrow Bussing, MD

## 2015-06-24 NOTE — Progress Notes (Signed)
POST HD   06/24/15 1314  Vital Signs  Temp 98 F (36.7 C)  Temp Source Oral  Pulse Rate 90  Pulse Rate Source Monitor  Resp 18  BP (!) 104/51 mmHg  BP Location Right Leg  BP Method Automatic  Patient Position (if appropriate) Lying  During Hemodialysis Assessment  Intra-Hemodialysis Comments 2656= hd treatment end . Goal met.  MD notified. blood returned and cvc accessed per policy.    Post-Hemodialysis Assessment  Rinseback Volume (mL) 250 mL  Dialyzer Clearance Clotted  Duration of HD Treatment -hour(s) 3 hour(s)  Hemodialysis Intake (mL) 500 mL  UF Total -Machine (mL) 2656 mL  Net UF (mL) 2156 mL  Tolerated HD Treatment Yes  Post-Hemodialysis Comments 2656= hd treatment end . Goal met.  MD notified. blood returned and cvc accessed per policy.    Education / Care Plan  Hemodialysis Education Provided Yes  Documented Education in Clinical Pathway Yes  Hemodialysis Catheter Right Femoral vein Triple-lumen  Placement Date/Time: 06/19/15 1800   Placed prior to admission: No  Time Out: Correct patient;Correct site;Correct procedure  Maximum sterile barrier precautions: Hand hygiene;Cap;Mask;Sterile gown;Sterile gloves;Large sterile sheet  Site Prep: Chlorh...  Site Condition No complications  Blue Lumen Status Heparin locked  Red Lumen Status Heparin locked  Purple Lumen Status Heparin locked  Catheter fill solution Heparin 1000 units/ml  Catheter fill volume (Arterial) 2.8 cc  Catheter fill volume (Venous) 2.8  Dressing Type Gauze/Drain sponge  Dressing Status Clean;Dry;Intact  Drainage Description None  Post treatment catheter status Capped and Clamped

## 2015-06-24 NOTE — Progress Notes (Signed)
POST HD   06/24/15 1315  Neurological  Level of Consciousness Alert  Orientation Level Oriented X4  Respiratory  Respiratory Pattern Regular;Unlabored  Chest Assessment Chest expansion symmetrical  Bilateral Breath Sounds Diminished  Cardiac  Pulse Regular  ECG Monitor Yes  Cardiac Rhythm NSR  Vascular  Edema Generalized;Right lower extremity;Left lower extremity  Generalized Edema +1  RLE Edema +2  LLE Edema +2  Integumentary  Integumentary (WDL) X  Skin Color Appropriate for ethnicity  Skin Condition Dry;Flaky  Skin Integrity Surgical Incision (see LDA)  Ecchymosis Location Arm  Ecchymosis Location Orientation Right;Left  Weeping Location Arm  Weeping Location Orientation Left  Musculoskeletal  Musculoskeletal (WDL) X  Generalized Weakness Yes  Gastrointestinal  Bowel Sounds Assessment Active  GU Assessment  Genitourinary (WDL) X  Genitourinary Symptoms Other (Comment) (HD)  Psychosocial  Psychosocial (WDL) WDL

## 2015-06-24 NOTE — Progress Notes (Signed)
PT Cancellation Note  Patient Details Name: Shane Hamilton MRN: [id number] DOB: 01-21-1966   Cancelled Treatment:    Reason Eval/Treat Not Completed: Patient at procedure or test/unavailable (Pt off floor at dialysis.)  Will re-attempt PT treatment at a later date/time as medically appropriate.   Irving Burton Lemond Griffee 06/24/2015, 10:37 AM Hendricks Limes, PT [phone]

## 2015-06-24 NOTE — Progress Notes (Signed)
PRE HD   06/24/15 0930  Neurological  Level of Consciousness Alert  Orientation Level Oriented X4  Respiratory  Respiratory Pattern Regular;Unlabored  Chest Assessment Chest expansion symmetrical  Bilateral Breath Sounds Diminished  Cardiac  Pulse Regular  ECG Monitor Yes  Cardiac Rhythm NSR  Vascular  Edema Generalized;Right lower extremity;Left lower extremity  Generalized Edema +1  RLE Edema +2  LLE Edema +2  Integumentary  Integumentary (WDL) X  Skin Color Appropriate for ethnicity  Skin Condition Dry;Flaky  Skin Integrity Surgical Incision (see LDA)  Ecchymosis Location Arm  Ecchymosis Location Orientation Right;Left  Weeping Location Arm  Weeping Location Orientation Left  Musculoskeletal  Musculoskeletal (WDL) X  Generalized Weakness Yes  Gastrointestinal  Bowel Sounds Assessment Active  GU Assessment  Genitourinary (WDL) X  Genitourinary Symptoms Other (Comment) (HD)  Psychosocial  Psychosocial (WDL) WDL  Incision (Closed) 06/20/15 Arm Left  Date First Assessed/Time First Assessed: 06/20/15 1942   Location: Arm  Location Orientation: Left  Dressing Type Negative pressure wound therapy  Dressing Clean;Dry;Intact  Site / Wound Assessment Dressing in place / Unable to assess  Drainage Amount Minimal  Drainage Description Serosanguineous  Incision (Closed) 06/20/15 Arm Left  Date First Assessed: 06/20/15   Location: Arm  Location Orientation: Left  Dressing Type Liquid skin adhesive  Site / Wound Assessment Clean;Dry

## 2015-06-24 NOTE — Progress Notes (Signed)
PRE HD   06/24/15 0930  Report  Report Received From ALECIA BABB, RN  Vital Signs  Temp 98.7 F (37.1 C)  Temp Source Oral  Pulse Rate 95  Pulse Rate Source Monitor  Resp 18  BP (!) 111/47 mmHg  BP Location Right Leg  BP Method Automatic  Patient Position (if appropriate) Lying  Oxygen Therapy  SpO2 (!) 79 %  O2 Device Nasal Cannula  O2 Flow Rate (L/min) 4 L/min  Pain Assessment  Pain Assessment No/denies pain  Pain Score 0  Dialysis Weight  Weight 101.2 kg (223 lb 1.7 oz)  Type of Weight Pre-Dialysis  Time-Out for Hemodialysis  What Procedure? HEMODIALYSIS  Pt Identifiers(min of two) First/Last Name;MRN/Account#  Correct Site? Yes  Correct Side? Yes  Correct Procedure? Yes  Consents Verified? Yes  Rad Studies Available? N/A  Safety Precautions Reviewed? Yes  Machine [employer] Number [id number]  Station Number 3  UF/Alarm Test Passed  Conductivity: Meter 13.6  Conductivity: Machine  13.9  pH 7.4  Reverse Osmosis MAIN  Dialyzer Lot Number [id number]  Disposable Set Lot Number [id number]  Machine Temperature 98.6 F (37 C)  Immunologist and Audible Yes  Blood Lines Intact and Secured Yes  Hemodialysis Catheter Right Femoral vein Triple-lumen  Placement Date/Time: 06/19/15 1800   Placed prior to admission: No  Time Out: Correct patient;Correct site;Correct procedure  Maximum sterile barrier precautions: Hand hygiene;Cap;Mask;Sterile gown;Sterile gloves;Large sterile sheet  Site Prep: Chlorh...  Site Condition No complications  Blue Lumen Status Heparin locked  Red Lumen Status Heparin locked  Purple Lumen Status Saline locked  Catheter fill solution Heparin 1000 units/ml  Catheter fill volume (Arterial) 2.8 cc  Catheter fill volume (Venous) 2.8  Dressing Type Gauze/Drain sponge  Dressing Status Clean;Dry;Intact  Interventions Dressing changed  Drainage Description None  Dressing Change Due 06/26/15

## 2015-06-25 ENCOUNTER — Encounter: Payer: Self-pay | Admitting: Anesthesiology

## 2015-06-25 ENCOUNTER — Encounter
Admission: EM | Disposition: A | Discharge status: Home Health | Payer: Self-pay | Source: Home / Self Care | Attending: Internal Medicine

## 2015-06-25 ENCOUNTER — Inpatient Hospital Stay: Payer: Medicare Other

## 2015-06-25 ENCOUNTER — Inpatient Hospital Stay: Payer: Medicare Other | Admitting: Anesthesiology

## 2015-06-25 HISTORY — PX: PERIPHERAL VASCULAR CATHETERIZATION: SHX172C

## 2015-06-25 HISTORY — PX: INSERTION OF DIALYSIS CATHETER: SHX1324

## 2015-06-25 LAB — RENAL FUNCTION PANEL
ALBUMIN: 2.8 g/dL — AB (ref 3.5–5.0)
ANION GAP: 12 (ref 5–15)
BUN: 47 mg/dL — ABNORMAL HIGH (ref 6–20)
CALCIUM: 8.9 mg/dL (ref 8.9–10.3)
CO2: 28 mmol/L (ref 22–32)
Chloride: 97 mmol/L — ABNORMAL LOW (ref 101–111)
Creatinine, Ser: 12.92 mg/dL — ABNORMAL HIGH (ref 0.61–1.24)
GFR calc non Af Amer: 4 mL/min — ABNORMAL LOW (ref >60)
GFR, EST AFRICAN AMERICAN: 5 mL/min — AB (ref >60)
Glucose, Bld: 114 mg/dL — ABNORMAL HIGH (ref 65–99)
PHOSPHORUS: 5.5 mg/dL — AB (ref 2.5–4.6)
POTASSIUM: 3.9 mmol/L (ref 3.5–5.1)
SODIUM: 137 mmol/L (ref 135–145)

## 2015-06-25 LAB — GLUCOSE, CAPILLARY
GLUCOSE-CAPILLARY: 129 mg/dL — AB (ref 65–99)
GLUCOSE-CAPILLARY: 188 mg/dL — AB (ref 65–99)
GLUCOSE-CAPILLARY: 80 mg/dL (ref 65–99)
GLUCOSE-CAPILLARY: 93 mg/dL (ref 65–99)

## 2015-06-25 SURGERY — INSERTION OF DIALYSIS CATHETER
Anesthesia: Monitor Anesthesia Care

## 2015-06-25 SURGERY — DIALYSIS/PERMA CATHETER INSERTION
Anesthesia: Moderate Sedation

## 2015-06-25 SURGERY — INSERTION OF DIALYSIS CATHETER
Anesthesia: Choice

## 2015-06-25 MED ORDER — LIDOCAINE-PRILOCAINE 2.5-2.5 % EX CREA: 1.0000 "application " | TOPICAL_CREAM | CUTANEOUS | Status: DC | PRN

## 2015-06-25 MED ORDER — PENTAFLUOROPROP-TETRAFLUOROETH EX AERO: 1.0000 "application " | INHALATION_SPRAY | CUTANEOUS | Status: DC | PRN

## 2015-06-25 MED ORDER — SODIUM CHLORIDE 0.9 % IV SOLN: 100.0000 mL | INTRAVENOUS | Status: DC | PRN

## 2015-06-25 MED ORDER — LIDOCAINE-EPINEPHRINE (PF) 1 %-1:200000 IJ SOLN
INTRAMUSCULAR | Status: AC
  Filled 2015-06-25: qty 30

## 2015-06-25 MED ORDER — CEFAZOLIN SODIUM-DEXTROSE 2-3 GM-% IV SOLR
2.0000 g | INTRAVENOUS | Status: DC
  Filled 2015-06-25: qty 50

## 2015-06-25 MED ORDER — LIDOCAINE HCL (PF) 1 % IJ SOLN
5.0000 mL | INTRAMUSCULAR | Status: DC | PRN
  Filled 2015-06-25: qty 5

## 2015-06-25 MED ORDER — HEPARIN SODIUM (PORCINE) 1000 UNIT/ML DIALYSIS
1000.0000 [IU] | INTRAMUSCULAR | Status: DC | PRN
  Filled 2015-06-25: qty 1

## 2015-06-25 MED ORDER — HEPARIN (PORCINE) IN NACL 2-0.9 UNIT/ML-% IJ SOLN
INTRAMUSCULAR | Status: AC
  Filled 2015-06-25: qty 500

## 2015-06-25 MED ORDER — HEPARIN SODIUM (PORCINE) 1000 UNIT/ML DIALYSIS
20.0000 [IU]/kg | INTRAMUSCULAR | Status: DC | PRN
  Filled 2015-06-25: qty 2

## 2015-06-25 MED ORDER — ONDANSETRON HCL 4 MG/2ML IJ SOLN: 4.0000 mg | Freq: Once | INTRAMUSCULAR | Status: DC | PRN

## 2015-06-25 MED ORDER — IOHEXOL 300 MG/ML  SOLN
INTRAMUSCULAR | Status: DC | PRN
  Administered 2015-06-25: 10 mL via INTRAVENOUS

## 2015-06-25 MED ORDER — MIDAZOLAM HCL 2 MG/2ML IJ SOLN
INTRAMUSCULAR | Status: DC | PRN
  Administered 2015-06-25: 1 mg via INTRAVENOUS

## 2015-06-25 MED ORDER — FENTANYL CITRATE (PF) 100 MCG/2ML IJ SOLN: 25.0000 ug | INTRAMUSCULAR | Status: DC | PRN

## 2015-06-25 MED ORDER — HEPARIN SODIUM (PORCINE) 10000 UNIT/ML IJ SOLN
INTRAMUSCULAR | Status: AC
  Filled 2015-06-25: qty 1

## 2015-06-25 MED ORDER — CARVEDILOL 6.25 MG PO TABS
3.1250 mg | ORAL_TABLET | Freq: Two times a day (BID) | ORAL | Status: DC
  Administered 2015-06-26: 3.125 mg via ORAL
  Filled 2015-06-25: qty 1

## 2015-06-25 MED ORDER — ALTEPLASE 2 MG IJ SOLR
2.0000 mg | Freq: Once | INTRAMUSCULAR | Status: DC | PRN
  Filled 2015-06-25: qty 2

## 2015-06-25 SURGICAL SUPPLY — 8 items
BALLN ULTRVRSE 8X40X75C (BALLOONS)
BALLOON ULTRVRSE 8X40X75C (BALLOONS) IMPLANT
CANNULA 5F STIFF (CANNULA) IMPLANT
CATH PALINDROME RT-P 15FX23CM (CATHETERS) IMPLANT
DEVICE PRESTO INFLATION (MISCELLANEOUS) IMPLANT
GLIDEWIRE ADV .035X180CM (WIRE) IMPLANT
PACK ANGIOGRAPHY (CUSTOM PROCEDURE TRAY) IMPLANT
SHEATH BRITE TIP 6FRX11 (SHEATH) IMPLANT

## 2015-06-25 SURGICAL SUPPLY — 8 items
BALLN ULTRVRSE 8X40X75C (BALLOONS) ×3
BALLOON ULTRVRSE 8X40X75C (BALLOONS) ×1 IMPLANT
CANNULA 5F STIFF (CANNULA) ×3 IMPLANT
CATH PALINDROME RT-P 15FX23CM (CATHETERS) ×3 IMPLANT
DEVICE PRESTO INFLATION (MISCELLANEOUS) ×3 IMPLANT
GLIDEWIRE ADV .035X180CM (WIRE) ×3 IMPLANT
PACK ANGIOGRAPHY (CUSTOM PROCEDURE TRAY) ×3 IMPLANT
SHEATH BRITE TIP 6FRX11 (SHEATH) ×3 IMPLANT

## 2015-06-25 NOTE — Op Note (Signed)
OPERATIVE NOTE    PRE-OPERATIVE DIAGNOSIS: 1. ESRD 2. Morbid obesity  POST-OPERATIVE DIAGNOSIS: same as above with jugular/innominate vein stenosis  PROCEDURE: 1. Ultrasound guidance for vascular access to the right internal jugular vein 2. Right jugular venogram and SVC gram 3. Percutaneous transluminal angioplasty of the right jugular/innominate vein with 8 mm x 4 cm balloon 4. Fluoroscopic guidance for placement of catheter 5. Placement of a 23 cm tip to cuff tunneled hemodialysis catheter via the right internal jugular vein  SURGEON: Leotis Pain, MD  ANESTHESIA:  Local/MCS  ESTIMATED BLOOD LOSS: 25 cc  FINDING(S): 1.  Patent right internal jugular vein in the neck with a 70-80% stenosis just below the clavicle at the junction to the innominate vein  SPECIMEN(S):  None  CONTRAST: 10 cc  FLUORO TIME: 3 minutes, 12 seconds  INDICATIONS:   Shane Hamilton is a 49 y.o. male who presents with ESRD and need for new access.  His fistula was removed for infection.  The patient needs long term dialysis access for their ESRD, and a Permcath is necessary.  Risks and benefits are discussed and informed consent is obtained.    DESCRIPTION: After obtaining full informed written consent, the patient was brought back to the vascular suited. The patient's right neck and chest were sterilely prepped and draped in a sterile surgical field was created.  The cervical right internal jugular vein was visualized with ultrasound and found to be patent. It was then accessed under direct ultrasound guidance and a permanent image was recorded. A wire would not easily pass despite using two different wires.  I then placed a micropuncture sheath to perform a venogram.  A venogram of the right jugular vein, innominate vein, and SVC were done and a moderate to high grade stenosis in the 70-80% range was seen in the jugular vein just below the clavicle at the junction to the innominate vein.  I then placed a  Terumo advantage wire and a 6 Fr sheath.  I was able to navigate through the stenosis with the Advantage wire.  I then used a 8 mm x 4 cm balloon inflated to 10 atm to break the waist and held this for one minute.  Completion showed this to be much improved and I then removed the 6 Fr sheath and left the Advantage wire in place. After skin nick and dilatation, the peel-away sheath was placed over the wire. I then turned my attention to an area under the clavicle. Approximately 1-2 fingerbreadths below the clavicle a small counterincision was created and tunneled from the subclavicular incision to the access site. Using fluoroscopic guidance, a 23 centimeter tip to cuff tunneled hemodialysis catheter was selected, and tunneled from the subclavicular incision to the access site. It was then placed through the peel-away sheath and the peel-away sheath was removed. Using fluoroscopic guidance the catheter tips were parked in the right atrium. The appropriate distal connectors were placed. It withdrew blood well and flushed easily with heparinized saline and a concentrated heparin solution was then placed. It was secured to the chest wall with 2 Prolene sutures. The access incision was closed single 4-0 Monocryl. A 4-0 Monocryl pursestring suture was placed around the exit site. Sterile dressings were placed. The patient tolerated the procedure well and was taken to the recovery room in stable condition.  COMPLICATIONS: None  CONDITION: Stable  DEW,JASON  06/25/2015, 3:01 PM

## 2015-06-25 NOTE — Progress Notes (Signed)
HD tx completed.

## 2015-06-25 NOTE — Progress Notes (Signed)
Nutrition Follow-up     INTERVENTION:  Meals and snacks: Cater to pt preferences within diet restrictions.     NUTRITION DIAGNOSIS:   Inadequate oral intake related to acute illness as evidenced by NPO status. Being addressed with pt on solid food diet    GOAL:   Patient will meet greater than or equal to 90% of their needs    MONITOR:    (Energy Intake, Digestive System, Anthropometrics, Electrolyte/Renal Profile, Glucose Profile)  REASON FOR ASSESSMENT:   Consult Enteral/tube feeding initiation and management  ASSESSMENT:      Planning permacath placement today   Current Nutrition: Pt reports does not like hospital foods (no taste). Eating bites to 50% of meals.  " I can't wait to get out of here."   Gastrointestinal Profile: Last BM: 9/18   Medications: reviewed  Electrolyte/Renal Profile and Glucose Profile:   Recent Labs Lab 06/21/15 0548 06/22/15 0447 06/24/15 1030  NA 138 136 134*  K 3.7 3.8 3.8  CL 97* 97* 93*  CO2 BUN 31* 43* 47*  CREATININE 9.32* 11.80* 13.93*  CALCIUM 8.6* 8.3* 8.6*  PHOS  --   --  6.0*  GLUCOSE 132* 109* 164*   Protein Profile:   Recent Labs Lab 06/24/15 1030  ALBUMIN 2.9*      Weight Trend since Admission: Filed Weights   06/20/15 1524 06/21/15 0000 06/24/15 0930  Weight: 441 lb 2.3 oz (200.1 kg) 217 lb 2.5 oz (98.5 kg) 223 lb 1.7 oz (101.2 kg)      Diet Order:  Diet NPO time specified  Skin:  Reviewed, no issues   Height:   Ht Readings from Last 1 Encounters:  06/21/15  (1.626 m)    Weight:   Wt Readings from Last 1 Encounters:  06/24/15 223 lb 1.7 oz (101.2 kg)    BMI:  Body mass index is 38.28 kg/(m^2).  Estimated Nutritional Needs:   Kcal:  2190-2555 kcals   Protein:  88-110 g (1.2-1.5 g/kg) using IBW 73 kg  Fluid:  plus UOP  EDUCATION NEEDS:   No education needs identified at this time  LOW Care Level  Jordin Dambrosio B. Freida Busman, RD, LDN [phone]  (pager)

## 2015-06-25 NOTE — Progress Notes (Signed)
HD tx start 

## 2015-06-25 NOTE — Progress Notes (Signed)
Pre-hd tx 

## 2015-06-25 NOTE — Progress Notes (Signed)
PT Cancellation Note  Patient Details Name: Shane Hamilton MRN: [id number] DOB: [birth year]   Cancelled Treatment:    Reason Eval/Treat Not Completed: Patient at procedure or test/unavailable (Nursing reports pt off floor and was going straight from OR to dialysis this afternoon.)  Will re-attempt PT treatment at a later date/time.   Hendricks Limes 06/25/2015, 3:50 PM Hendricks Limes, PT [phone]

## 2015-06-25 NOTE — Progress Notes (Signed)
Called by nursing for patient wanting to leave AMA.  Per patient he was told multiple times he would be going home today.  Chart review reveals he was awaiting home health to be set up to care for his wound vac.  Pt states this was done and he got his supplies and spoke to the home health agency. On chart review it is unclear to me why he was not discharged.  However, I explained to the patient that I was not present during the day and am unaware of all that may have transpired as to medical reasoning for him not to be discharged and so can not safely discharge him tonight.  He decided to stay, but requests early morning discharge.    Kristeen Miss [facility] 06/25/2015, 10:25 PM

## 2015-06-25 NOTE — Anesthesia Preprocedure Evaluation (Signed)
Anesthesia Evaluation  Patient identified by MRN, date of birth, ID band Patient awake    Reviewed: Allergy & Precautions, NPO status , Patient's Chart, lab work & pertinent test results, reviewed documented beta blocker date and time   Airway Mallampati: IV  TM Distance: >3 FB     Dental  (+) Chipped   Pulmonary sleep apnea and Continuous Positive Airway Pressure Ventilation , pneumonia, resolved,           Cardiovascular hypertension,      Neuro/Psych Seizures -,     GI/Hepatic   Endo/Other    Renal/GU ESRFRenal disease     Musculoskeletal   Abdominal   Peds  Hematology  (+) anemia ,   Anesthesia Other Findings Gout.  Reproductive/Obstetrics                             Anesthesia Physical Anesthesia Plan  ASA: IV  Anesthesia Plan: MAC   Post-op Pain Management:    Induction: Intravenous  Airway Management Planned: Nasal Cannula  Additional Equipment:   Intra-op Plan:   Post-operative Plan:   Informed Consent: I have reviewed the patients History and Physical, chart, labs and discussed the procedure including the risks, benefits and alternatives for the proposed anesthesia with the patient or authorized representative who has indicated his/her understanding and acceptance.     Plan Discussed with: CRNA  Anesthesia Plan Comments:         Anesthesia Quick Evaluation

## 2015-06-25 NOTE — Progress Notes (Signed)
Post hd tx 

## 2015-06-25 NOTE — Transfer of Care (Signed)
Immediate Anesthesia Transfer of Care Note  Patient: Shane Hamilton  Procedure(s) Performed: Procedure(s): place a tunnelled catheter for long term use (N/A)  Patient Location: PACU  Anesthesia Type:MAC  Level of Consciousness: awake, alert  and oriented  Airway & Oxygen Therapy: Patient Spontanous Breathing and Patient connected to nasal cannula oxygen  Post-op Assessment: Report given to RN and Post -op Vital signs reviewed and stable  Post vital signs: Reviewed and stable  Last Vitals: 100% sat/ 98hr/ 16resp/98 temp/ 96/67 Filed Vitals:   06/25/15 1235  BP: 137/48  Pulse: 96  Temp: 37.2 C  Resp: 22    Complications: No apparent anesthesia complications

## 2015-06-25 NOTE — Care Management (Signed)
Spoke with Page at [facility] and wound VAC will be delivered at some point today. Patient will have permacath placed at around 1400 today. Spoke with patient concerning diascharge plan. Patient has O2 concentrator at home and also wheelchair and walker. Dialysis is [facility]. Patient offered choice of providers and would like to use [facility].  Contacted Becky Sax at [facility] who accepted the patient. Referral faxed. Patient would like portable O2 tank. DME provider is [facility] will contact.

## 2015-06-25 NOTE — Progress Notes (Signed)
The patient had his PermCath placed today as well as a venous intervention for jugular vein stenosis and this can be used for his dialysis. I have been informed by [facility] that his wound VAC will be available between 4 and 5:00 today for his use at home. From a vascular standpoint, he can be discharged this afternoon with home health changing his VAC dressing 3 times weekly and using his PermCath for his dialysis access. His temporary dialysis catheter can be removed. He should return to our office in 2-3 weeks for wound check.

## 2015-06-25 NOTE — Care Management (Signed)
Spoke with Will Anderson at [facility] who stated the patient is open with them for O2 supplies. He will speak with the patient about portable tanks.

## 2015-06-25 NOTE — Progress Notes (Signed)
[facility] - [place] at [facility]   PATIENT NAME: Shane Hamilton    MR#:  [id number]  DATE OF BIRTH:  1966/07/29  SUBJECTIVE:  CHIEF COMPLAINT:   Chief Complaint  Patient presents with  . infection of fistula    - Patient doing well and extubated 06/21/15 morning. Currently on 2-3 L nasal cannula. - Patient completely alert and oriented -Infected AV fistula excised  06/20/15 - no new complains. - permacath placement for today.  REVIEW OF SYSTEMS:  Review of Systems  Constitutional: Negative for fever and chills.  HENT: Negative for ear discharge, ear pain and nosebleeds.   Respiratory: Negative for cough, shortness of breath and wheezing.   Cardiovascular: Negative for chest pain and palpitations.  Gastrointestinal: Negative for nausea, vomiting, abdominal pain, diarrhea and constipation.  Genitourinary: Negative for dysuria and urgency.  Neurological: Negative for dizziness, sensory change, speech change, focal weakness, seizures and headaches.  Psychiatric/Behavioral: Negative for depression.    DRUG ALLERGIES:   Allergies  Allergen Reactions  . Aspirin Other (See Comments)    Reaction:  GI upset     VITALS:  Blood pressure 101/74, pulse 99, temperature 98 F (36.7 C), temperature source Oral, resp. rate 11, height  (1.626 m), weight 101.2 kg (223 lb 1.7 oz), SpO2 100 %.  PHYSICAL EXAMINATION:  Physical Exam  GENERAL:  49 y.o.-year-old morbidly obese patient sitting in the bed EYES: Pupils equal, round, reactive to light and accommodation. Proptosis of the eyes noted. No scleral icterus.  extraocular movements intact  HEENT: Head atraumatic, normocephalic. Oropharynx and nasopharynx clear.  NECK:  Neck is short and thick. Supple, no jugular venous distention. No thyroid enlargement, no tenderness.  LUNGS: Decreased bilateral breath sounds, no wheezes or crackles heard.. No use of accessory muscles of respiration. CARDIOVASCULAR: S1,  S2 normal. No murmurs, rubs, or gallops.  ABDOMEN: Obese abdomen. Soft, nontender, nondistended. Bowel sounds present. No organomegaly or mass.  EXTREMITIES: 1+ pedal edema present, no cyanosis, or clubbing.  Right hand first 2 digits indicated traumatic contracture injury. Left arm AV fistula excision  done. Dressing in place NEUROLOGIC:  Cranial nerves intact, no focal motor or sensory deficits. Able to move all extremities and sensation intact PSYCHIATRIC: The patient is alert and oriented 3  SKIN: No obvious rash, lesion, or ulcer.    LABORATORY PANEL:   CBC  Recent Labs Lab 06/24/15 1030  WBC 9.0  HGB 9.3*  HCT 29.0*  PLT 234   ------------------------------------------------------------------------------------------------------------------  Chemistries   Recent Labs Lab 06/24/15 1030  NA 134*  K 3.8  CL 93*  CO2 29  GLUCOSE 164*  BUN 47*  CREATININE 13.93*  CALCIUM 8.6*   ------------------------------------------------------------------------------------------------------------------  Cardiac Enzymes No results for input(s): TROPONINI in the last 168 hours. ------------------------------------------------------------------------------------------------------------------  RADIOLOGY:  Dg Chest 2 View  06/24/2015   CLINICAL DATA:  Follow-up pulmonary edema. Morbid obesity. Obstructive sleep apnea. End-stage renal disease on hemodialysis.  EXAM: CHEST  2 VIEW  COMPARISON:  06/21/2015 chest radiograph.  FINDINGS: Stable cardiomediastinal silhouette with mild cardiomegaly and mildly prominent main pulmonary artery contour. No pneumothorax. No pleural effusion. There is mild residual pulmonary edema, significantly decreased. No new focal lung opacity.  IMPRESSION: 1. Stable mild cardiomegaly. Mild residual pulmonary edema, significantly decreased. 2. Mildly prominent main pulmonary artery contour, which could indicate pulmonary hypertension.   Electronically Signed   By:  Delbert Phenix M.D.   On: 06/24/2015 14:09   Dg C-arm 1-60 Min-no Report  06/25/2015  CLINICAL DATA: permcath placement   C-ARM 1-60 MINUTES  Fluoroscopy was utilized by the requesting physician.  No radiographic  interpretation.     EKG:  No orders found for this or any previous visit.  ASSESSMENT AND PLAN:   49 year old morbidly obese male with past medical history significant for sleep apnea, end-stage renal disease, diabetes mellitus and chronic DVT presents secondary to AV fistula abscess. Also went into acute hypercarbic hypoxic respiratory failure.  #1 Acute respiratory failure-hypoxic and hypercarbic respiratory failure - Failed BiPAP and had to be intubated. Appreciate  pulmonary consult. - extubated 9/16 morning. Back on 2 L oxygen which is his chronic home O2  - at night need CPAP due to sleep apnea.  #2 Sepsis and AV fistula infection/abscess-blood cultures from [facility] from 06/17/2015 are negative so far - Wound cultures by aspirating his fistula on admission growing MSSA - appreciate ID consult,  on ancef for 4-6 weeks with hemodialysis- follow with ID clinic in 4 weeks. - AV fistula excised 06/20/15 - will need a visiting nurse at home to help with wound vac and dressing. - permacath placed.  #3 End-stage renal disease-on 4 times a week dialysis. -On Monday, Tuesday, Thursday and Saturday schedule.  - AV fistula infection with MSSA abscess - temp dialysis catheter placed, s/p excision of AV fistula 06/20/15  -placed permacath prior to discharge-  -Appreciate vascular and nephrology consults  #4 hypotension-post intubation and while on sedation. - Held blood pressure medications for now. Required Levophed drip- tapered off . - also sepsis from AV graft infection   Now BP stable, have some tachycardia- added carvedilol.  #5 diabetes mellitus-Lantus dose is decreased to half as patient is nothing by mouth and SLIDING SCALE INSULIN.  #6 DVT  prophylaxis-subcutaneous heparin  #7 GI prophylaxis-on Protonix  All the records are reviewed and case discussed with Care Management/Social Workerr. Management plans discussed with the patient, family and they are in agreement.  CODE STATUS: Full code  TOTAL  TIME SPENT IN TAKING CARE OF THIS PATIENT: 35 minutes.   POSSIBLE D/C IN 3 DAYS,after permacath placement,  DEPENDING ON CLINICAL CONDITION.   Altamese Dilling M.D on 06/25/2015 at 4:10 PM  Between 7am to 6pm - Pager - [phone]  After 6pm go to [email or web address] - password [email or web address] [facility]  [facility] [place] [facility]  Office  [phone]  CC: Primary care physician; Darrow Bussing, MD

## 2015-06-26 ENCOUNTER — Telehealth: Payer: Self-pay | Admitting: *Deleted

## 2015-06-26 LAB — GLUCOSE, CAPILLARY: Glucose-Capillary: 131 mg/dL — ABNORMAL HIGH (ref 65–99)

## 2015-06-26 MED ORDER — DEXTROSE 5 % IV SOLN: INTRAVENOUS | Status: DC

## 2015-06-26 MED ORDER — OXYCODONE HCL 10 MG PO TABS: 10.0000 mg | ORAL_TABLET | Freq: Two times a day (BID) | ORAL | Status: DC | PRN

## 2015-06-26 MED ORDER — CEFAZOLIN SODIUM-DEXTROSE 2-3 GM-% IV SOLR
2.0000 g | Freq: Every day | INTRAVENOUS | Status: DC | PRN
  Filled 2015-06-26: qty 50

## 2015-06-26 MED ORDER — CARVEDILOL 3.125 MG PO TABS: 3.1250 mg | ORAL_TABLET | Freq: Two times a day (BID) | ORAL | Status: DC

## 2015-06-26 MED ORDER — CEPHALEXIN 500 MG PO CAPS: 500.0000 mg | ORAL_CAPSULE | Freq: Every day | ORAL | Status: DC

## 2015-06-26 MED ORDER — CEPHALEXIN 500 MG PO CAPS: 500.0000 mg | ORAL_CAPSULE | Freq: Four times a day (QID) | ORAL | Status: DC

## 2015-06-26 MED ORDER — INSULIN ASPART PROT & ASPART (70-30 MIX) 100 UNIT/ML ~~LOC~~ SUSP: 10.0000 [IU] | Freq: Three times a day (TID) | SUBCUTANEOUS | Status: DC

## 2015-06-26 MED ORDER — INSULIN GLARGINE 100 UNIT/ML ~~LOC~~ SOLN: 35.0000 [IU] | Freq: Every day | SUBCUTANEOUS | Status: DC

## 2015-06-26 NOTE — Care Management (Signed)
Called to patient room to discuss wound vac.  Equipment was delivered last night and patient had concerns about the nurse showing him how to use it. Explained to patient that representative from [facility] was to come by this morning and speak with him. Also that his nurse would be hooking him up to his portable vac at time of discharge and showing him how to use it.

## 2015-06-26 NOTE — Progress Notes (Signed)
Day shift nurse reported to me that pt has been stating that when he returns from dialysis this evening that he is going home. Report received from dialysis at approx 2015. Dialysis reported that per pt, when he returns to his room that he is going to leave to go home.   Called wife around 2030. She states that he has been saying all day that he will be going home once he returns from dialysis. Wife was at home when we talked on the phone. I explained to her that there were no MD orders for patient to be discharged home but that we could check into it tomorrow morning first thing. She states "it won't hurt him to stay one more night. I am tired and need some rest". Requested that wife call to speak with pt to try to convince him to stay overnight. Per wife, she said she would give him a call.  Pt back to room from dialysis at appox 2100. Pt demanding to go home. He said "the doctor this morning said that I could go home after dialysis". Press photographer, house supervisor & MD spoke to pt concerning this situation. After speaking with MD, pt decided to stay overnight. Wife arrived to pt's room at some point this evening and would not recognize our phone conversation in front of the pt. She said "I have been right here this whole time".  Throughout the shift, pt complained that he would rather be at home and he didn't understand why we were keeping him here. Reiterated conversation that MD had with pt. Pt unhooked his wound vac numerous times. I educated him on the importance of calling nursing staff if he needs anything done to the wound vac. Pt states "I know how to work this wound vac".   Pt's IV infiltrated today. IV Anvef was due at that time. Pt requests PO antibiotics since he will be going home tomorrow. Dr. Anne Hahn notified. New orders for Keflex qd. Notified pt that PO antibiotic will be given in the morning as scheduled. Pt is satisfied with this.  Pt c/o indigestion/nausea. PRN PO zofran given as  ordered. Will continue to monitor.

## 2015-06-26 NOTE — Discharge Instructions (Signed)
Follow with PMD in 2 weeks. Follow with ID clinic in 3 weeks.

## 2015-06-26 NOTE — Anesthesia Postprocedure Evaluation (Signed)
  Anesthesia Post-op Note  Patient: Shane Hamilton  Procedure(s) Performed: Procedure(s): place a tunnelled catheter for long term use (N/A)  Anesthesia type:MAC  Patient location: PACU  Post pain: Pain level controlled  Post assessment: Post-op Vital signs reviewed, Patient's Cardiovascular Status Stable, Respiratory Function Stable, Patent Airway and No signs of Nausea or vomiting  Post vital signs: Reviewed and stable  Last Vitals:  Filed Vitals:   06/26/15 0619  BP: 112/61  Pulse: 102  Temp:   Resp: 20    Level of consciousness: awake, alert  and patient cooperative  Complications: No apparent anesthesia complications

## 2015-06-26 NOTE — Progress Notes (Signed)
[name] and O x 4. VSS. Pt tolerating diet well. No complaints of pain or nausea. Prescriptions given. Pt had wound vac changed over to home wound vac. Pt voiced understanding of discharge instructions with no further questions. Pt discharged via wheelchair with nurse.

## 2015-06-26 NOTE — Telephone Encounter (Signed)
Pt scheduled for appt for OSA. Nothing further needed.

## 2015-06-26 NOTE — Telephone Encounter (Signed)
-----   Message from Shane Crutch, MD sent at 06/24/2015  3:56 PM EDT ----- Regarding: follow up.  Please scheduled follow up with me in 2 to 4 weeks for OSA.

## 2015-06-26 NOTE — Care Management (Signed)
Discussed case with Dr Esaw Grandchild,  Patient for discharge today, Scripts placed on chart for antibiotics to be given at dialysis, copy faxed to Ivor Reining who stated over the phone that she would contact patient dialysis center to inform of discharge.

## 2015-06-26 NOTE — Discharge Summary (Addendum)
[facility] Physicians - [place] at [facility]   PATIENT NAME: Shane Hamilton    MR#:  [id number]  DATE OF BIRTH:  07-Nov-1965  DATE OF ADMISSION:  06/18/2015 ADMITTING PHYSICIAN: Adrian Saran, MD  DATE OF DISCHARGE: 06/26/2015  PRIMARY CARE PHYSICIAN: Darrow Bussing, MD    ADMISSION DIAGNOSIS:  Arteriovenous fistula infection, initial encounter [T82.7XXA]  DISCHARGE DIAGNOSIS:  Active Problems:   AV fistula infection   Arteriovenous fistula infection   SECONDARY DIAGNOSIS:   Past Medical History  Diagnosis Date  . Type 2 diabetes mellitus, uncontrolled        . Morbid obesity   . OSA (obstructive sleep apnea)   . Septic shock(785.52)   . Anemia   . Hypertension   . Seizures   . Gout   . ESRD (end stage renal disease)   . LOC (loss of consciousness)   . DVT (deep venous thrombosis) 2011  . Cardiomegaly   . Chronic anticoagulation   . Chronic pancreatitis   . Secondary hyperparathyroidism     HOSPITAL COURSE:   49 year old morbidly obese male with past medical history significant for sleep apnea, end-stage renal disease, diabetes mellitus and chronic DVT presents secondary to AV fistula abscess. Also went into acute hypercarbic hypoxic respiratory failure.  #1 Acute respiratory failure-hypoxic and hypercarbic respiratory failure - Failed BiPAP and had to be intubated. Appreciate pulmonary consult. - extubated 9/16 morning. Back on 2 L oxygen which is his chronic home O2  - at night need CPAP due to sleep apnea.  #2 Sepsis and AV fistula infection/abscess-blood cultures from [facility] from 06/17/2015 are negative so far - Wound cultures by aspirating his fistula on admission growing MSSA - appreciate ID consult, on ancef for 4-6 weeks with hemodialysis- follow with ID clinic in 4 weeks. - AV fistula excised 06/20/15 - will need a visiting nurse at home to help with wound vac and dressing. - permacath placed.  #3 End-stage renal  disease-on 4 times a week dialysis. -On Monday, Tuesday, Thursday and Saturday schedule.  - AV fistula infection with MSSA abscess - temp dialysis catheter placed, s/p excision of AV fistula 06/20/15  -placed permacath prior to discharge-  -Appreciate vascular and nephrology consults  #4 hypotension-post intubation and while on sedation. - Held blood pressure medications for now. Required Levophed drip- tapered off . - also sepsis from AV graft infection  Now BP stable, have some tachycardia- added carvedilol.  #5 diabetes mellitus-Lantus dose is decreased to half as patient is nothing by mouth and SLIDING SCALE INSULIN.  #6 DVT prophylaxis-subcutaneous heparin  Had hx of DVT- Coumadin was stopped due to vascular surgery- now resume.  #7 GI prophylaxis-on Protonix  DISCHARGE CONDITIONS:   Stable.  CONSULTS OBTAINED:  Treatment Team:  Renford Dills, MD Mosetta Pigeon, MD Clydie Braun, MD  DRUG ALLERGIES:   Allergies  Allergen Reactions  . Aspirin Other (See Comments)    Reaction:  GI upset     DISCHARGE MEDICATIONS:   Current Discharge Medication List    START taking these medications   Details  carvedilol (COREG) 3.125 MG tablet Take 1 tablet (3.125 mg total) by mouth 2 (two) times daily with a meal. Qty: 60 tablet, Refills: 0    ceFAZolin in dextrose 5 % 50 mL ivpb 2 gram IV at the end of each dialysis- for 3 weeks. Qty: 1000 mL, Refills: 0      CONTINUE these medications which have CHANGED   Details  insulin aspart protamine-  aspart (NOVOLOG MIX 70/30) (70-30) 100 UNIT/ML injection Inject 0.1 mLs (10 Units total) into the skin 3 (three) times daily with meals. Qty: 10 mL, Refills: 11    insulin glargine (LANTUS) 100 UNIT/ML injection Inject 0.35 mLs (35 Units total) into the skin at bedtime. Qty: 10 mL, Refills: 11    Oxycodone HCl 10 MG TABS Take 1 tablet (10 mg total) by mouth 2 (two) times daily as needed (pain). Qty: 20 tablet, Refills: 0       CONTINUE these medications which have NOT CHANGED   Details  allopurinol (ZYLOPRIM) 300 MG tablet Take 300 mg by mouth 2 (two) times daily.    cinacalcet (SENSIPAR) 60 MG tablet Take 60 mg by mouth daily.    clonazePAM (KLONOPIN) 1 MG tablet Take 1 mg by mouth 2 (two) times daily.    colchicine 0.6 MG tablet Take 0.6 mg by mouth daily.    gabapentin (NEURONTIN) 300 MG capsule Take 300 mg by mouth every 8 (eight) hours.    hydrOXYzine (ATARAX/VISTARIL) 25 MG tablet Take 25 mg by mouth 3 (three) times daily as needed.    lanthanum (FOSRENOL) 1000 MG chewable tablet Chew 1,000 mg by mouth 2 (two) times daily with a meal.    multivitamin (RENA-VIT) TABS tablet Take 1 tablet by mouth daily.  Refills: 3    omeprazole (PRILOSEC) 20 MG capsule Take 20 mg by mouth 2 (two) times daily before a meal.     warfarin (COUMADIN) 7.5 MG tablet Take 7.5 mg by mouth See admin instructions. Pt takes 7.5mg  on Tuesday, Wednesday, Thursday, Saturday and Sunday and  on Monday and Friday    atorvastatin (LIPITOR) 10 MG tablet Take 10 mg by mouth daily.    calcium acetate (PHOSLO) 667 MG capsule Take 2,001 mg by mouth 3 (three) times daily with meals.       STOP taking these medications     amLODipine (NORVASC) 5 MG tablet          DISCHARGE INSTRUCTIONS:    Follow with ID and Vascular clinic in 2 weeks.  If you experience worsening of your admission symptoms, develop shortness of breath, life threatening emergency, suicidal or homicidal thoughts you must seek medical attention immediately by calling 911 or calling your MD immediately  if symptoms less severe.  You Must read complete instructions/literature along with all the possible adverse reactions/side effects for all the Medicines you take and that have been prescribed to you. Take any new Medicines after you have completely understood and accept all the possible adverse reactions/side effects.   Please note  You were cared for by  a hospitalist during your hospital stay. If you have any questions about your discharge medications or the care you received while you were in the hospital after you are discharged, you can call the unit and asked to speak with the hospitalist on call if the hospitalist that took care of you is not available. Once you are discharged, your primary care physician will handle any further medical issues. Please note that NO REFILLS for any discharge medications will be authorized once you are discharged, as it is imperative that you return to your primary care physician (or establish a relationship with a primary care physician if you do not have one) for your aftercare needs so that they can reassess your need for medications and monitor your lab values.    Today   CHIEF COMPLAINT:   Chief Complaint  Patient presents with  .  infection of fistula     HISTORY OF PRESENT ILLNESS:  Shane Hamilton  is a 49 y.o. male with a known history of end-stage renal disease on hemodialysis, OSA and type 2 diabetes who presents with above complaint. Patient was admitted yesterday to [facility] for AV fistula abscess he subsequently left AGAINST MEDICAL ADVICE stating that he preferred to come to [facility] for evaluation since his vascular surgeon is at this hospital. Patient reports that he has had issues with his left arm fistula for the past week. Apparently he has been getting IV vancomycin and Ceptaz edema during dialysis.  He presents today with AV fistula abscess. He has been seen by our vascular surgeon who is asking a hospitalist to admit the patient. Patient will receive temporary catheter for dialysis and will likely need a fistula ligation plan for either tonight or in the a.m.    VITAL SIGNS:  Blood pressure 112/61, pulse 102, temperature 98.4 F (36.9 C), temperature source Oral, resp. rate 20, height 5\' 4"  (1.626 m), weight 101.2 kg (223 lb 1.7 oz), SpO2 88 %.  I/O:    Intake/Output Summary  (Last 24 hours) at 06/26/15 1101 Last data filed at 06/26/15 0800  Gross per 24 hour  Intake  640.1 ml  Output   3000 ml  Net -2359.9 ml    PHYSICAL EXAMINATION:   GENERAL: 49 y.o.-year-old morbidly obese patient sitting in the bed EYES: Pupils equal, round, reactive to light and accommodation. Proptosis of the eyes noted. No scleral icterus. extraocular movements intact  HEENT: Head atraumatic, normocephalic. Oropharynx and nasopharynx clear.  NECK: Neck is short and thick. Supple, no jugular venous distention. No thyroid enlargement, no tenderness.  LUNGS: Decreased bilateral breath sounds, no wheezes or crackles heard.. No use of accessory muscles of respiration. CARDIOVASCULAR: S1, S2 normal. No murmurs, rubs, or gallops.  ABDOMEN: Obese abdomen. Soft, nontender, nondistended. Bowel sounds present. No organomegaly or mass.  EXTREMITIES: 1+ pedal edema present, no cyanosis, or clubbing.  Right hand first 2 digits indicated traumatic contracture injury. Left arm AV fistula excision done. Dressing in place NEUROLOGIC: Cranial nerves intact, no focal motor or sensory deficits. Able to move all extremities and sensation intact PSYCHIATRIC: The patient is alert and oriented 3  SKIN: No obvious rash, lesion, or ulcer.   DATA REVIEW:   CBC  Recent Labs Lab 06/24/15 1030  WBC 9.0  HGB 9.3*  HCT 29.0*  PLT 234    Chemistries   Recent Labs Lab 06/25/15 1719  NA 137  K 3.9  CL 97*  CO2 28  GLUCOSE 114*  BUN 47*  CREATININE 12.92*  CALCIUM 8.9    Cardiac Enzymes No results for input(s): TROPONINI in the last 168 hours.  Microbiology Results  Results for orders placed or performed during the hospital encounter of 06/18/15  Body fluid culture     Status: None   Collection Time: 06/18/15  4:38 PM  Result Value Ref Range Status   Specimen Description ABSCESS  Final   Special Requests NONE  Final   Gram Stain   Final    TOO NUMEROUS TO COUNT WBC  SEEN MODERATE GRAM POSITIVE COCCI FEW GRAM NEGATIVE RODS    Culture MODERATE GROWTH STAPHYLOCOCCUS AUREUS  Final   Report Status 06/22/2015 FINAL  Final   Organism ID, Bacteria STAPHYLOCOCCUS AUREUS  Final      Susceptibility   Staphylococcus aureus - MIC*    CIPROFLOXACIN <=0.5 SENSITIVE Sensitive     GENTAMICIN <=  0.5 SENSITIVE Sensitive     OXACILLIN 0.5 SENSITIVE Sensitive     TRIMETH/SULFA <=10 SENSITIVE Sensitive     CEFOXITIN SCREEN NEGATIVE Sensitive     Inducible Clindamycin NEGATIVE Sensitive     TETRACYCLINE Value in next row Resistant      RESISTANT>=16    * MODERATE GROWTH STAPHYLOCOCCUS AUREUS  MRSA PCR Screening     Status: None   Collection Time: 06/19/15  3:29 PM  Result Value Ref Range Status   MRSA by PCR NEGATIVE NEGATIVE Final    Comment:        The GeneXpert MRSA Assay (FDA approved for NASAL specimens only), is one component of a comprehensive MRSA colonization surveillance program. It is not intended to diagnose MRSA infection nor to guide or monitor treatment for MRSA infections.   Culture, blood (routine x 2)     Status: None   Collection Time: 06/19/15  4:00 PM  Result Value Ref Range Status   Specimen Description BLOOD RIGHT ARM  Final   Special Requests BOTTLES DRAWN AEROBIC AND ANAEROBIC  2CC  Final   Culture NO GROWTH 5 DAYS  Final   Report Status 06/24/2015 FINAL  Final  Culture, blood (routine x 2)     Status: None   Collection Time: 06/19/15  4:00 PM  Result Value Ref Range Status   Specimen Description BLOOD RIGHT ARM  Final   Special Requests BOTTLES DRAWN AEROBIC AND ANAEROBIC  2CC  Final   Culture NO GROWTH 5 DAYS  Final   Report Status 06/24/2015 FINAL  Final    RADIOLOGY:  Dg Chest 2 View  06/24/2015   CLINICAL DATA:  Follow-up pulmonary edema. Morbid obesity. Obstructive sleep apnea. End-stage renal disease on hemodialysis.  EXAM: CHEST  2 VIEW  COMPARISON:  06/21/2015 chest radiograph.  FINDINGS: Stable cardiomediastinal  silhouette with mild cardiomegaly and mildly prominent main pulmonary artery contour. No pneumothorax. No pleural effusion. There is mild residual pulmonary edema, significantly decreased. No new focal lung opacity.  IMPRESSION: 1. Stable mild cardiomegaly. Mild residual pulmonary edema, significantly decreased. 2. Mildly prominent main pulmonary artery contour, which could indicate pulmonary hypertension.   Electronically Signed   By: Delbert Phenix M.D.   On: 06/24/2015 14:09   Dg C-arm 1-60 Min-no Report  06/25/2015   CLINICAL DATA: permcath placement   C-ARM 1-60 MINUTES  Fluoroscopy was utilized by the requesting physician.  No radiographic  interpretation.     EKG:  No orders found for this or any previous visit.    Management plans discussed with the patient, family and they are in agreement.  CODE STATUS:     Code Status Orders        Start     Ordered   06/18/15 1839  Full code   Continuous     06/18/15 1838      TOTAL TIME TAKING CARE OF THIS PATIENT:35 minutes.    Altamese Dilling M.D on 06/26/2015 at 11:01 AM  Between 7am to 6pm - Pager - [phone]  After 6pm go to [email or web address] - password [email or web address]  [facility]  Office  [phone]  CC: Primary care physician; Darrow Bussing, MD   Note: This dictation was prepared with Dragon dictation along with smaller phrase technology. Any transcriptional errors that result from this process are unintentional.

## 2015-06-27 DIAGNOSIS — Z7901 Long term (current) use of anticoagulants: Secondary | ICD-10-CM | POA: Diagnosis not present

## 2015-06-27 DIAGNOSIS — L0390 Cellulitis, unspecified: Secondary | ICD-10-CM | POA: Diagnosis not present

## 2015-06-27 DIAGNOSIS — T827XXD Infection and inflammatory reaction due to other cardiac and vascular devices, implants and grafts, subsequent encounter: Secondary | ICD-10-CM | POA: Diagnosis not present

## 2015-06-27 DIAGNOSIS — R509 Fever, unspecified: Secondary | ICD-10-CM | POA: Diagnosis not present

## 2015-06-27 DIAGNOSIS — N186 End stage renal disease: Secondary | ICD-10-CM | POA: Diagnosis not present

## 2015-06-27 DIAGNOSIS — Z794 Long term (current) use of insulin: Secondary | ICD-10-CM | POA: Diagnosis not present

## 2015-06-27 DIAGNOSIS — E1165 Type 2 diabetes mellitus with hyperglycemia: Secondary | ICD-10-CM | POA: Diagnosis not present

## 2015-06-27 DIAGNOSIS — N2581 Secondary hyperparathyroidism of renal origin: Secondary | ICD-10-CM | POA: Diagnosis not present

## 2015-06-27 DIAGNOSIS — T82898D Other specified complication of vascular prosthetic devices, implants and grafts, subsequent encounter: Secondary | ICD-10-CM | POA: Diagnosis not present

## 2015-06-27 DIAGNOSIS — Z86711 Personal history of pulmonary embolism: Secondary | ICD-10-CM | POA: Diagnosis not present

## 2015-06-27 DIAGNOSIS — I2782 Chronic pulmonary embolism: Secondary | ICD-10-CM | POA: Diagnosis not present

## 2015-06-27 DIAGNOSIS — G4733 Obstructive sleep apnea (adult) (pediatric): Secondary | ICD-10-CM | POA: Diagnosis not present

## 2015-06-27 DIAGNOSIS — I120 Hypertensive chronic kidney disease with stage 5 chronic kidney disease or end stage renal disease: Secondary | ICD-10-CM | POA: Diagnosis not present

## 2015-06-27 DIAGNOSIS — D631 Anemia in chronic kidney disease: Secondary | ICD-10-CM | POA: Diagnosis not present

## 2015-06-29 DIAGNOSIS — G4733 Obstructive sleep apnea (adult) (pediatric): Secondary | ICD-10-CM | POA: Diagnosis not present

## 2015-06-29 DIAGNOSIS — I120 Hypertensive chronic kidney disease with stage 5 chronic kidney disease or end stage renal disease: Secondary | ICD-10-CM | POA: Diagnosis not present

## 2015-06-29 DIAGNOSIS — N2581 Secondary hyperparathyroidism of renal origin: Secondary | ICD-10-CM | POA: Diagnosis not present

## 2015-06-29 DIAGNOSIS — T82898D Other specified complication of vascular prosthetic devices, implants and grafts, subsequent encounter: Secondary | ICD-10-CM | POA: Diagnosis not present

## 2015-06-29 DIAGNOSIS — I2782 Chronic pulmonary embolism: Secondary | ICD-10-CM | POA: Diagnosis not present

## 2015-06-29 DIAGNOSIS — R509 Fever, unspecified: Secondary | ICD-10-CM | POA: Diagnosis not present

## 2015-06-29 DIAGNOSIS — E1165 Type 2 diabetes mellitus with hyperglycemia: Secondary | ICD-10-CM | POA: Diagnosis not present

## 2015-06-29 DIAGNOSIS — N186 End stage renal disease: Secondary | ICD-10-CM | POA: Diagnosis not present

## 2015-06-29 DIAGNOSIS — L0390 Cellulitis, unspecified: Secondary | ICD-10-CM | POA: Diagnosis not present

## 2015-06-29 DIAGNOSIS — D631 Anemia in chronic kidney disease: Secondary | ICD-10-CM | POA: Diagnosis not present

## 2015-06-29 DIAGNOSIS — T827XXD Infection and inflammatory reaction due to other cardiac and vascular devices, implants and grafts, subsequent encounter: Secondary | ICD-10-CM | POA: Diagnosis not present

## 2015-06-29 DIAGNOSIS — Z86711 Personal history of pulmonary embolism: Secondary | ICD-10-CM | POA: Diagnosis not present

## 2015-07-01 DIAGNOSIS — E1165 Type 2 diabetes mellitus with hyperglycemia: Secondary | ICD-10-CM | POA: Diagnosis not present

## 2015-07-01 DIAGNOSIS — D631 Anemia in chronic kidney disease: Secondary | ICD-10-CM | POA: Diagnosis not present

## 2015-07-01 DIAGNOSIS — R509 Fever, unspecified: Secondary | ICD-10-CM | POA: Diagnosis not present

## 2015-07-01 DIAGNOSIS — G4733 Obstructive sleep apnea (adult) (pediatric): Secondary | ICD-10-CM | POA: Diagnosis not present

## 2015-07-01 DIAGNOSIS — L0390 Cellulitis, unspecified: Secondary | ICD-10-CM | POA: Diagnosis not present

## 2015-07-01 DIAGNOSIS — N186 End stage renal disease: Secondary | ICD-10-CM | POA: Diagnosis not present

## 2015-07-01 DIAGNOSIS — N2581 Secondary hyperparathyroidism of renal origin: Secondary | ICD-10-CM | POA: Diagnosis not present

## 2015-07-01 DIAGNOSIS — T827XXD Infection and inflammatory reaction due to other cardiac and vascular devices, implants and grafts, subsequent encounter: Secondary | ICD-10-CM | POA: Diagnosis not present

## 2015-07-01 DIAGNOSIS — I120 Hypertensive chronic kidney disease with stage 5 chronic kidney disease or end stage renal disease: Secondary | ICD-10-CM | POA: Diagnosis not present

## 2015-07-01 DIAGNOSIS — T82898D Other specified complication of vascular prosthetic devices, implants and grafts, subsequent encounter: Secondary | ICD-10-CM | POA: Diagnosis not present

## 2015-07-02 ENCOUNTER — Encounter: Payer: Self-pay | Admitting: Vascular Surgery

## 2015-07-02 ENCOUNTER — Ambulatory Visit: Admission: AD | Admit: 2015-07-02 | Payer: Medicare Other | Source: Ambulatory Visit | Admitting: Vascular Surgery

## 2015-07-02 DIAGNOSIS — I82599 Chronic embolism and thrombosis of other specified deep vein of unspecified lower extremity: Secondary | ICD-10-CM | POA: Diagnosis not present

## 2015-07-02 DIAGNOSIS — N2581 Secondary hyperparathyroidism of renal origin: Secondary | ICD-10-CM | POA: Diagnosis not present

## 2015-07-02 DIAGNOSIS — Z5181 Encounter for therapeutic drug level monitoring: Secondary | ICD-10-CM | POA: Diagnosis not present

## 2015-07-02 DIAGNOSIS — I2782 Chronic pulmonary embolism: Secondary | ICD-10-CM | POA: Diagnosis not present

## 2015-07-02 DIAGNOSIS — Z7901 Long term (current) use of anticoagulants: Secondary | ICD-10-CM | POA: Diagnosis not present

## 2015-07-02 DIAGNOSIS — D631 Anemia in chronic kidney disease: Secondary | ICD-10-CM | POA: Diagnosis not present

## 2015-07-02 DIAGNOSIS — N186 End stage renal disease: Secondary | ICD-10-CM | POA: Diagnosis not present

## 2015-07-02 DIAGNOSIS — T82898D Other specified complication of vascular prosthetic devices, implants and grafts, subsequent encounter: Secondary | ICD-10-CM | POA: Diagnosis not present

## 2015-07-02 DIAGNOSIS — L0390 Cellulitis, unspecified: Secondary | ICD-10-CM | POA: Diagnosis not present

## 2015-07-02 DIAGNOSIS — R509 Fever, unspecified: Secondary | ICD-10-CM | POA: Diagnosis not present

## 2015-07-03 DIAGNOSIS — E1165 Type 2 diabetes mellitus with hyperglycemia: Secondary | ICD-10-CM | POA: Diagnosis not present

## 2015-07-03 DIAGNOSIS — T827XXD Infection and inflammatory reaction due to other cardiac and vascular devices, implants and grafts, subsequent encounter: Secondary | ICD-10-CM | POA: Diagnosis not present

## 2015-07-03 DIAGNOSIS — G4733 Obstructive sleep apnea (adult) (pediatric): Secondary | ICD-10-CM | POA: Diagnosis not present

## 2015-07-03 DIAGNOSIS — I120 Hypertensive chronic kidney disease with stage 5 chronic kidney disease or end stage renal disease: Secondary | ICD-10-CM | POA: Diagnosis not present

## 2015-07-03 DIAGNOSIS — N186 End stage renal disease: Secondary | ICD-10-CM | POA: Diagnosis not present

## 2015-07-04 DIAGNOSIS — N2581 Secondary hyperparathyroidism of renal origin: Secondary | ICD-10-CM | POA: Diagnosis not present

## 2015-07-04 DIAGNOSIS — I2782 Chronic pulmonary embolism: Secondary | ICD-10-CM | POA: Diagnosis not present

## 2015-07-04 DIAGNOSIS — R509 Fever, unspecified: Secondary | ICD-10-CM | POA: Diagnosis not present

## 2015-07-04 DIAGNOSIS — Z86711 Personal history of pulmonary embolism: Secondary | ICD-10-CM | POA: Diagnosis not present

## 2015-07-04 DIAGNOSIS — D631 Anemia in chronic kidney disease: Secondary | ICD-10-CM | POA: Diagnosis not present

## 2015-07-04 DIAGNOSIS — N186 End stage renal disease: Secondary | ICD-10-CM | POA: Diagnosis not present

## 2015-07-04 DIAGNOSIS — L0390 Cellulitis, unspecified: Secondary | ICD-10-CM | POA: Diagnosis not present

## 2015-07-04 DIAGNOSIS — T82898D Other specified complication of vascular prosthetic devices, implants and grafts, subsequent encounter: Secondary | ICD-10-CM | POA: Diagnosis not present

## 2015-07-05 DIAGNOSIS — E1129 Type 2 diabetes mellitus with other diabetic kidney complication: Secondary | ICD-10-CM | POA: Diagnosis not present

## 2015-07-05 DIAGNOSIS — I120 Hypertensive chronic kidney disease with stage 5 chronic kidney disease or end stage renal disease: Secondary | ICD-10-CM | POA: Diagnosis not present

## 2015-07-05 DIAGNOSIS — T827XXD Infection and inflammatory reaction due to other cardiac and vascular devices, implants and grafts, subsequent encounter: Secondary | ICD-10-CM | POA: Diagnosis not present

## 2015-07-05 DIAGNOSIS — N186 End stage renal disease: Secondary | ICD-10-CM | POA: Diagnosis not present

## 2015-07-05 DIAGNOSIS — Z992 Dependence on renal dialysis: Secondary | ICD-10-CM | POA: Diagnosis not present

## 2015-07-05 DIAGNOSIS — E1165 Type 2 diabetes mellitus with hyperglycemia: Secondary | ICD-10-CM | POA: Diagnosis not present

## 2015-07-05 DIAGNOSIS — G4733 Obstructive sleep apnea (adult) (pediatric): Secondary | ICD-10-CM | POA: Diagnosis not present

## 2015-07-06 DIAGNOSIS — N2581 Secondary hyperparathyroidism of renal origin: Secondary | ICD-10-CM | POA: Diagnosis not present

## 2015-07-06 DIAGNOSIS — D631 Anemia in chronic kidney disease: Secondary | ICD-10-CM | POA: Diagnosis not present

## 2015-07-06 DIAGNOSIS — L0390 Cellulitis, unspecified: Secondary | ICD-10-CM | POA: Diagnosis not present

## 2015-07-06 DIAGNOSIS — N186 End stage renal disease: Secondary | ICD-10-CM | POA: Diagnosis not present

## 2015-07-06 DIAGNOSIS — D689 Coagulation defect, unspecified: Secondary | ICD-10-CM | POA: Diagnosis not present

## 2015-07-08 DIAGNOSIS — N2581 Secondary hyperparathyroidism of renal origin: Secondary | ICD-10-CM | POA: Diagnosis not present

## 2015-07-08 DIAGNOSIS — L0390 Cellulitis, unspecified: Secondary | ICD-10-CM | POA: Diagnosis not present

## 2015-07-08 DIAGNOSIS — D689 Coagulation defect, unspecified: Secondary | ICD-10-CM | POA: Diagnosis not present

## 2015-07-08 DIAGNOSIS — I120 Hypertensive chronic kidney disease with stage 5 chronic kidney disease or end stage renal disease: Secondary | ICD-10-CM | POA: Diagnosis not present

## 2015-07-08 DIAGNOSIS — T827XXD Infection and inflammatory reaction due to other cardiac and vascular devices, implants and grafts, subsequent encounter: Secondary | ICD-10-CM | POA: Diagnosis not present

## 2015-07-08 DIAGNOSIS — E1165 Type 2 diabetes mellitus with hyperglycemia: Secondary | ICD-10-CM | POA: Diagnosis not present

## 2015-07-08 DIAGNOSIS — N186 End stage renal disease: Secondary | ICD-10-CM | POA: Diagnosis not present

## 2015-07-08 DIAGNOSIS — D631 Anemia in chronic kidney disease: Secondary | ICD-10-CM | POA: Diagnosis not present

## 2015-07-08 DIAGNOSIS — G4733 Obstructive sleep apnea (adult) (pediatric): Secondary | ICD-10-CM | POA: Diagnosis not present

## 2015-07-09 DIAGNOSIS — L0390 Cellulitis, unspecified: Secondary | ICD-10-CM | POA: Diagnosis not present

## 2015-07-09 DIAGNOSIS — N2581 Secondary hyperparathyroidism of renal origin: Secondary | ICD-10-CM | POA: Diagnosis not present

## 2015-07-09 DIAGNOSIS — N186 End stage renal disease: Secondary | ICD-10-CM | POA: Diagnosis not present

## 2015-07-09 DIAGNOSIS — D631 Anemia in chronic kidney disease: Secondary | ICD-10-CM | POA: Diagnosis not present

## 2015-07-09 DIAGNOSIS — D689 Coagulation defect, unspecified: Secondary | ICD-10-CM | POA: Diagnosis not present

## 2015-07-10 DIAGNOSIS — E1165 Type 2 diabetes mellitus with hyperglycemia: Secondary | ICD-10-CM | POA: Diagnosis not present

## 2015-07-10 DIAGNOSIS — I120 Hypertensive chronic kidney disease with stage 5 chronic kidney disease or end stage renal disease: Secondary | ICD-10-CM | POA: Diagnosis not present

## 2015-07-10 DIAGNOSIS — N186 End stage renal disease: Secondary | ICD-10-CM | POA: Diagnosis not present

## 2015-07-10 DIAGNOSIS — T827XXD Infection and inflammatory reaction due to other cardiac and vascular devices, implants and grafts, subsequent encounter: Secondary | ICD-10-CM | POA: Diagnosis not present

## 2015-07-10 DIAGNOSIS — G4733 Obstructive sleep apnea (adult) (pediatric): Secondary | ICD-10-CM | POA: Diagnosis not present

## 2015-07-11 DIAGNOSIS — I2782 Chronic pulmonary embolism: Secondary | ICD-10-CM | POA: Diagnosis not present

## 2015-07-11 DIAGNOSIS — D689 Coagulation defect, unspecified: Secondary | ICD-10-CM | POA: Diagnosis not present

## 2015-07-11 DIAGNOSIS — N2581 Secondary hyperparathyroidism of renal origin: Secondary | ICD-10-CM | POA: Diagnosis not present

## 2015-07-11 DIAGNOSIS — Z86711 Personal history of pulmonary embolism: Secondary | ICD-10-CM | POA: Diagnosis not present

## 2015-07-11 DIAGNOSIS — L0390 Cellulitis, unspecified: Secondary | ICD-10-CM | POA: Diagnosis not present

## 2015-07-11 DIAGNOSIS — N186 End stage renal disease: Secondary | ICD-10-CM | POA: Diagnosis not present

## 2015-07-11 DIAGNOSIS — D631 Anemia in chronic kidney disease: Secondary | ICD-10-CM | POA: Diagnosis not present

## 2015-07-12 DIAGNOSIS — G4733 Obstructive sleep apnea (adult) (pediatric): Secondary | ICD-10-CM | POA: Diagnosis not present

## 2015-07-12 DIAGNOSIS — N186 End stage renal disease: Secondary | ICD-10-CM | POA: Diagnosis not present

## 2015-07-12 DIAGNOSIS — I120 Hypertensive chronic kidney disease with stage 5 chronic kidney disease or end stage renal disease: Secondary | ICD-10-CM | POA: Diagnosis not present

## 2015-07-12 DIAGNOSIS — T827XXD Infection and inflammatory reaction due to other cardiac and vascular devices, implants and grafts, subsequent encounter: Secondary | ICD-10-CM | POA: Diagnosis not present

## 2015-07-12 DIAGNOSIS — E1165 Type 2 diabetes mellitus with hyperglycemia: Secondary | ICD-10-CM | POA: Diagnosis not present

## 2015-07-13 DIAGNOSIS — N186 End stage renal disease: Secondary | ICD-10-CM | POA: Diagnosis not present

## 2015-07-13 DIAGNOSIS — D631 Anemia in chronic kidney disease: Secondary | ICD-10-CM | POA: Diagnosis not present

## 2015-07-13 DIAGNOSIS — D689 Coagulation defect, unspecified: Secondary | ICD-10-CM | POA: Diagnosis not present

## 2015-07-13 DIAGNOSIS — L0390 Cellulitis, unspecified: Secondary | ICD-10-CM | POA: Diagnosis not present

## 2015-07-13 DIAGNOSIS — N2581 Secondary hyperparathyroidism of renal origin: Secondary | ICD-10-CM | POA: Diagnosis not present

## 2015-07-15 DIAGNOSIS — L0390 Cellulitis, unspecified: Secondary | ICD-10-CM | POA: Diagnosis not present

## 2015-07-15 DIAGNOSIS — N186 End stage renal disease: Secondary | ICD-10-CM | POA: Diagnosis not present

## 2015-07-15 DIAGNOSIS — D689 Coagulation defect, unspecified: Secondary | ICD-10-CM | POA: Diagnosis not present

## 2015-07-15 DIAGNOSIS — N2581 Secondary hyperparathyroidism of renal origin: Secondary | ICD-10-CM | POA: Diagnosis not present

## 2015-07-15 DIAGNOSIS — I120 Hypertensive chronic kidney disease with stage 5 chronic kidney disease or end stage renal disease: Secondary | ICD-10-CM | POA: Diagnosis not present

## 2015-07-15 DIAGNOSIS — E1165 Type 2 diabetes mellitus with hyperglycemia: Secondary | ICD-10-CM | POA: Diagnosis not present

## 2015-07-15 DIAGNOSIS — T827XXD Infection and inflammatory reaction due to other cardiac and vascular devices, implants and grafts, subsequent encounter: Secondary | ICD-10-CM | POA: Diagnosis not present

## 2015-07-15 DIAGNOSIS — G4733 Obstructive sleep apnea (adult) (pediatric): Secondary | ICD-10-CM | POA: Diagnosis not present

## 2015-07-15 DIAGNOSIS — D631 Anemia in chronic kidney disease: Secondary | ICD-10-CM | POA: Diagnosis not present

## 2015-07-16 DIAGNOSIS — D631 Anemia in chronic kidney disease: Secondary | ICD-10-CM | POA: Diagnosis not present

## 2015-07-16 DIAGNOSIS — N2581 Secondary hyperparathyroidism of renal origin: Secondary | ICD-10-CM | POA: Diagnosis not present

## 2015-07-16 DIAGNOSIS — D689 Coagulation defect, unspecified: Secondary | ICD-10-CM | POA: Diagnosis not present

## 2015-07-16 DIAGNOSIS — L0390 Cellulitis, unspecified: Secondary | ICD-10-CM | POA: Diagnosis not present

## 2015-07-16 DIAGNOSIS — N186 End stage renal disease: Secondary | ICD-10-CM | POA: Diagnosis not present

## 2015-07-17 DIAGNOSIS — I120 Hypertensive chronic kidney disease with stage 5 chronic kidney disease or end stage renal disease: Secondary | ICD-10-CM | POA: Diagnosis not present

## 2015-07-17 DIAGNOSIS — G4733 Obstructive sleep apnea (adult) (pediatric): Secondary | ICD-10-CM | POA: Diagnosis not present

## 2015-07-17 DIAGNOSIS — T827XXD Infection and inflammatory reaction due to other cardiac and vascular devices, implants and grafts, subsequent encounter: Secondary | ICD-10-CM | POA: Diagnosis not present

## 2015-07-17 DIAGNOSIS — E1165 Type 2 diabetes mellitus with hyperglycemia: Secondary | ICD-10-CM | POA: Diagnosis not present

## 2015-07-17 DIAGNOSIS — N186 End stage renal disease: Secondary | ICD-10-CM | POA: Diagnosis not present

## 2015-07-18 DIAGNOSIS — L0390 Cellulitis, unspecified: Secondary | ICD-10-CM | POA: Diagnosis not present

## 2015-07-18 DIAGNOSIS — D631 Anemia in chronic kidney disease: Secondary | ICD-10-CM | POA: Diagnosis not present

## 2015-07-18 DIAGNOSIS — Z86711 Personal history of pulmonary embolism: Secondary | ICD-10-CM | POA: Diagnosis not present

## 2015-07-18 DIAGNOSIS — N2581 Secondary hyperparathyroidism of renal origin: Secondary | ICD-10-CM | POA: Diagnosis not present

## 2015-07-18 DIAGNOSIS — D689 Coagulation defect, unspecified: Secondary | ICD-10-CM | POA: Diagnosis not present

## 2015-07-18 DIAGNOSIS — I2782 Chronic pulmonary embolism: Secondary | ICD-10-CM | POA: Diagnosis not present

## 2015-07-18 DIAGNOSIS — N186 End stage renal disease: Secondary | ICD-10-CM | POA: Diagnosis not present

## 2015-07-19 DIAGNOSIS — N186 End stage renal disease: Secondary | ICD-10-CM | POA: Diagnosis not present

## 2015-07-19 DIAGNOSIS — G4733 Obstructive sleep apnea (adult) (pediatric): Secondary | ICD-10-CM | POA: Diagnosis not present

## 2015-07-19 DIAGNOSIS — I120 Hypertensive chronic kidney disease with stage 5 chronic kidney disease or end stage renal disease: Secondary | ICD-10-CM | POA: Diagnosis not present

## 2015-07-19 DIAGNOSIS — E1165 Type 2 diabetes mellitus with hyperglycemia: Secondary | ICD-10-CM | POA: Diagnosis not present

## 2015-07-19 DIAGNOSIS — T827XXD Infection and inflammatory reaction due to other cardiac and vascular devices, implants and grafts, subsequent encounter: Secondary | ICD-10-CM | POA: Diagnosis not present

## 2015-07-20 DIAGNOSIS — N186 End stage renal disease: Secondary | ICD-10-CM | POA: Diagnosis not present

## 2015-07-20 DIAGNOSIS — N2581 Secondary hyperparathyroidism of renal origin: Secondary | ICD-10-CM | POA: Diagnosis not present

## 2015-07-20 DIAGNOSIS — L0390 Cellulitis, unspecified: Secondary | ICD-10-CM | POA: Diagnosis not present

## 2015-07-20 DIAGNOSIS — D689 Coagulation defect, unspecified: Secondary | ICD-10-CM | POA: Diagnosis not present

## 2015-07-20 DIAGNOSIS — D631 Anemia in chronic kidney disease: Secondary | ICD-10-CM | POA: Diagnosis not present

## 2015-07-22 DIAGNOSIS — N186 End stage renal disease: Secondary | ICD-10-CM | POA: Diagnosis not present

## 2015-07-22 DIAGNOSIS — T827XXD Infection and inflammatory reaction due to other cardiac and vascular devices, implants and grafts, subsequent encounter: Secondary | ICD-10-CM | POA: Diagnosis not present

## 2015-07-22 DIAGNOSIS — L0390 Cellulitis, unspecified: Secondary | ICD-10-CM | POA: Diagnosis not present

## 2015-07-22 DIAGNOSIS — D689 Coagulation defect, unspecified: Secondary | ICD-10-CM | POA: Diagnosis not present

## 2015-07-22 DIAGNOSIS — N2581 Secondary hyperparathyroidism of renal origin: Secondary | ICD-10-CM | POA: Diagnosis not present

## 2015-07-22 DIAGNOSIS — D631 Anemia in chronic kidney disease: Secondary | ICD-10-CM | POA: Diagnosis not present

## 2015-07-22 DIAGNOSIS — G4733 Obstructive sleep apnea (adult) (pediatric): Secondary | ICD-10-CM | POA: Diagnosis not present

## 2015-07-22 DIAGNOSIS — I120 Hypertensive chronic kidney disease with stage 5 chronic kidney disease or end stage renal disease: Secondary | ICD-10-CM | POA: Diagnosis not present

## 2015-07-22 DIAGNOSIS — E1165 Type 2 diabetes mellitus with hyperglycemia: Secondary | ICD-10-CM | POA: Diagnosis not present

## 2015-07-23 ENCOUNTER — Emergency Department (HOSPITAL_COMMUNITY)
Admission: EM | Admit: 2015-07-23 | Discharge: 2015-07-23 | Disposition: A | Discharge status: Home / Self Care | Payer: Medicare Other | Attending: Emergency Medicine | Admitting: Emergency Medicine

## 2015-07-23 ENCOUNTER — Encounter (HOSPITAL_COMMUNITY): Payer: Self-pay | Admitting: Emergency Medicine

## 2015-07-23 DIAGNOSIS — Z79899 Other long term (current) drug therapy: Secondary | ICD-10-CM | POA: Insufficient documentation

## 2015-07-23 DIAGNOSIS — G40909 Epilepsy, unspecified, not intractable, without status epilepticus: Secondary | ICD-10-CM | POA: Diagnosis not present

## 2015-07-23 DIAGNOSIS — Y713 Surgical instruments, materials and cardiovascular devices (including sutures) associated with adverse incidents: Secondary | ICD-10-CM | POA: Diagnosis not present

## 2015-07-23 DIAGNOSIS — N186 End stage renal disease: Secondary | ICD-10-CM | POA: Diagnosis not present

## 2015-07-23 DIAGNOSIS — Z862 Personal history of diseases of the blood and blood-forming organs and certain disorders involving the immune mechanism: Secondary | ICD-10-CM | POA: Diagnosis not present

## 2015-07-23 DIAGNOSIS — E119 Type 2 diabetes mellitus without complications: Secondary | ICD-10-CM | POA: Insufficient documentation

## 2015-07-23 DIAGNOSIS — N2581 Secondary hyperparathyroidism of renal origin: Secondary | ICD-10-CM | POA: Diagnosis not present

## 2015-07-23 DIAGNOSIS — I120 Hypertensive chronic kidney disease with stage 5 chronic kidney disease or end stage renal disease: Secondary | ICD-10-CM | POA: Diagnosis not present

## 2015-07-23 DIAGNOSIS — Z992 Dependence on renal dialysis: Secondary | ICD-10-CM | POA: Diagnosis not present

## 2015-07-23 DIAGNOSIS — L0390 Cellulitis, unspecified: Secondary | ICD-10-CM | POA: Diagnosis not present

## 2015-07-23 DIAGNOSIS — T8249XA Other complication of vascular dialysis catheter, initial encounter: Secondary | ICD-10-CM | POA: Diagnosis not present

## 2015-07-23 DIAGNOSIS — S299XXA Unspecified injury of thorax, initial encounter: Secondary | ICD-10-CM | POA: Diagnosis not present

## 2015-07-23 DIAGNOSIS — T82898A Other specified complication of vascular prosthetic devices, implants and grafts, initial encounter: Secondary | ICD-10-CM | POA: Diagnosis not present

## 2015-07-23 DIAGNOSIS — Z86718 Personal history of other venous thrombosis and embolism: Secondary | ICD-10-CM | POA: Diagnosis not present

## 2015-07-23 DIAGNOSIS — T859XXA Unspecified complication of internal prosthetic device, implant and graft, initial encounter: Secondary | ICD-10-CM | POA: Diagnosis not present

## 2015-07-23 DIAGNOSIS — Z794 Long term (current) use of insulin: Secondary | ICD-10-CM | POA: Insufficient documentation

## 2015-07-23 DIAGNOSIS — D689 Coagulation defect, unspecified: Secondary | ICD-10-CM | POA: Diagnosis not present

## 2015-07-23 DIAGNOSIS — T829XXA Unspecified complication of cardiac and vascular prosthetic device, implant and graft, initial encounter: Secondary | ICD-10-CM

## 2015-07-23 DIAGNOSIS — D631 Anemia in chronic kidney disease: Secondary | ICD-10-CM | POA: Diagnosis not present

## 2015-07-23 LAB — PROTIME-INR
INR: 2.08 — AB (ref 0.00–1.49)
PROTHROMBIN TIME: 23.2 s — AB (ref 11.6–15.2)

## 2015-07-23 NOTE — ED Notes (Signed)
Pt arrives via EMS from home with central line/diaylsis shunt pulled out when he changed his shirt. Bleeding appears controlled.   130/palp, 140/90 BP HR 110

## 2015-07-23 NOTE — ED Provider Notes (Signed)
CSN: [id number]     Arrival date & time 07/23/15  2100 History   First MD Initiated Contact with Patient 07/23/15 2112     Chief Complaint  Patient presents with  . Vascular Access Problem     (Consider location/radiation/quality/duration/timing/severity/associated sxs/prior Treatment) HPI 49 year old male who presents with vascular access problem. History of type 2 diabetes, morbid obesity, OSA and chronic respiratory failure on 2 L home oxygen, and end-stage renal disease on hemodialysis. I was recently admitted to the hospital in September 2016 for abscess involving his AV fistula. Had dialysis catheter that was placed in the right IJ. I went for dialysis today without issues. Says he is otherwise been in his usual state of health, and when attempting to take off his shirt pulled out his dialysis catheter. Reports minimal bleeding, and came to the ED for evaluation. Denies any recent fevers, chills, cough, difficulty breathing, chest pain, lightheadedness, nausea or vomiting. Past Medical History  Diagnosis Date  . Type 2 diabetes mellitus, uncontrolled (HCC)        . Morbid obesity (HCC)   . OSA (obstructive sleep apnea)   . Septic shock(785.52)   . Anemia   . Hypertension   . Seizures (HCC)   . Gout   . ESRD (end stage renal disease) (HCC)   . LOC (loss of consciousness)   . DVT (deep venous thrombosis) (HCC) 2011  . Cardiomegaly   . Chronic anticoagulation   . Chronic pancreatitis (HCC)   . Secondary hyperparathyroidism [facility])    Past Surgical History  Procedure Laterality Date  . Av fistula placement    . Amputation finger / thumb Right   . I&d extremity Left 06/20/2015    Procedure: IRRIGATION AND DEBRIDEMENT EXTREMITY;  Surgeon: Renford Dills, MD;  Location: [facility] ORS;  Service: Vascular;  Laterality: Left;  . Application of wound vac Left 06/20/2015    Procedure: APPLICATION OF WOUND VAC;  Surgeon: Renford Dills, MD;  Location: [facility] ORS;  Service: Vascular;   Laterality: Left;  . Av fistula placement Left 06/20/2015    Procedure: ARTERIOVENOUS (AV) FISTULA  ligation , excision;  Surgeon: Renford Dills, MD;  Location: [facility] ORS;  Service: Vascular;  Laterality: Left;  . Peripheral vascular catheterization N/A 06/25/2015    Procedure: Dialysis/Perma Catheter Insertion;  Surgeon: Annice Needy, MD;  Location: [facility] INVASIVE CV LAB;  Service: Cardiovascular;  Laterality: N/A;  . Insertion of dialysis catheter N/A 06/25/2015    Procedure: place a tunnelled catheter for long term use;  Surgeon: Annice Needy, MD;  Location: [facility] ORS;  Service: Vascular;  Laterality: N/A;   Family History  Problem Relation Age of Onset  . Diabetes Mother   . Hypertension Mother   . Diabetes Father   . Hypertension Father   . Hypertension Brother    Social History  Substance Use Topics  . Smoking status: Never Smoker   . Smokeless tobacco: Never Used  . Alcohol Use: Yes     Comment: Occasional wine use    Review of Systems 10/14 systems reviewed and are negative other than those stated in the HPI   Allergies  Aspirin  Home Medications   Prior to Admission medications   Medication Sig Start Date End Date Taking? Authorizing Provider  allopurinol (ZYLOPRIM) 300 MG tablet Take 300 mg by mouth 2 (two) times daily.    Historical Provider, MD  atorvastatin (LIPITOR) 10 MG tablet Take 10 mg by mouth daily.    Historical Provider,  MD  calcium acetate (PHOSLO) 667 MG capsule Take 2,001 mg by mouth 3 (three) times daily with meals.     Historical Provider, MD  carvedilol (COREG) 3.125 MG tablet Take 1 tablet (3.125 mg total) by mouth 2 (two) times daily with a meal. 06/26/15   Altamese Dilling, MD  ceFAZolin in dextrose 5 % 50 mL ivpb 2 gram IV at the end of each dialysis- for 3 weeks. 06/26/15   Altamese Dilling, MD  cinacalcet (SENSIPAR) 60 MG tablet Take 60 mg by mouth daily.    Historical Provider, MD  clonazePAM (KLONOPIN) 1 MG tablet Take 1 mg by mouth 2  (two) times daily.    Historical Provider, MD  colchicine 0.6 MG tablet Take 0.6 mg by mouth daily.    Historical Provider, MD  gabapentin (NEURONTIN) 300 MG capsule Take 300 mg by mouth every 8 (eight) hours.    Historical Provider, MD  hydrOXYzine (ATARAX/VISTARIL) 25 MG tablet Take 25 mg by mouth 3 (three) times daily as needed.    Historical Provider, MD  insulin aspart protamine- aspart (NOVOLOG MIX 70/30) (70-30) 100 UNIT/ML injection Inject 0.1 mLs (10 Units total) into the skin 3 (three) times daily with meals. 06/26/15   Altamese Dilling, MD  insulin glargine (LANTUS) 100 UNIT/ML injection Inject 0.35 mLs (35 Units total) into the skin at bedtime. 06/26/15   Altamese Dilling, MD  lanthanum (FOSRENOL) 1000 MG chewable tablet Chew 1,000 mg by mouth 2 (two) times daily with a meal.    Historical Provider, MD  multivitamin (RENA-VIT) TABS tablet Take 1 tablet by mouth daily.  12/05/14   Historical Provider, MD  omeprazole (PRILOSEC) 20 MG capsule Take 20 mg by mouth 2 (two) times daily before a meal.     Historical Provider, MD  Oxycodone HCl 10 MG TABS Take 1 tablet (10 mg total) by mouth 2 (two) times daily as needed (pain). 06/26/15   Altamese Dilling, MD  warfarin (COUMADIN) 7.5 MG tablet Take 7.5 mg by mouth See admin instructions. Pt takes 7.5mg  on Tuesday, Wednesday, Thursday, Saturday and Sunday and  on Monday and Friday    Historical Provider, MD   BP 157/96 mmHg  Pulse 109  Temp(Src) 99 F (37.2 C) (Oral)  Resp 22  Ht  (1.803 m)  Wt 428 lb 12.7 oz (194.5 kg)  BMI 59.83 kg/m2  SpO2 100% Physical Exam Physical Exam  Nursing note and vitals reviewed. Constitutional: Chronically ill appearing, nontoxic, in no acute distress Head: Normocephalic and atraumatic.  Mouth/Throat: Oropharynx is clear and moist.  Neck: Normal range of motion. Neck supple.  Cardiovascular: Tachycardic rate and regular rhythm.   Pulmonary/Chest: Effort normal and breath sounds  normal.  site of dialysis catheter appearing clean, dry, and intact over the right upper chest wall. No active bleeding and no surrounding skin changes. Abdominal: Soft. Morbidly obese.  Musculoskeletal: Normal range of motion.  Neurological: Alert, no facial droop, fluent speech, moves all extremities symmetrically Skin: Skin is warm and dry.  Psychiatric: Cooperative  ED Course  Procedures (including critical care time) Labs Review Labs Reviewed  PROTIME-INR - Abnormal; Notable for the following:    Prothrombin Time 23.2 (*)    INR 2.08 (*)    All other components within normal limits   I have personally reviewed and evaluated these images and lab results as part of my medical decision-making.   MDM   Final diagnoses:  Complication of vascular access for dialysis, initial encounter (HCC)  49 year old male with history of diabetes and end-stage renal disease on hemodialysis who presents   after accidentally pulling out his permacath today. Has no other complaints, and is at his baseline state of health. Site of dialysis catheter appears clean, with no active bleeding. He does take Coumadin for chronic DVT, and has an INR of 2.08. Discussed with vascular surgery, Dr. Myra GianottiBrabham , who states that his office will call the patient tomorrow to set up an appointment for vascular access placement in 2 days prior to his next dialysis. He was instructed to hold his Coumadin until after his catheter is placed. Strict return and follow-up instructions are reviewed. He expressed understanding of all discharge instructions and felt comfortable to plan of care.  Lavera Guiseana Duo Kacelyn Rowzee, MD 07/24/15 [phone]

## 2015-07-23 NOTE — Discharge Instructions (Signed)
Please stop taking coumadin/warfarin until your catheter is replaced by vascular surgery.  Vascular surgery will call you tomorrow to set up a time and place to replace your dialysis catheter on Thursday, before your dialysis.  Return without fail for worsening symptoms, including difficulty breathing, chest pain, fever, vomiting and unable to keep down food/fluids, confusion/altered mental status,  or any other symptoms concerning to you.

## 2015-07-23 NOTE — ED Notes (Signed)
Patient left at this time with all belongings. 

## 2015-07-24 ENCOUNTER — Other Ambulatory Visit: Payer: Self-pay

## 2015-07-24 ENCOUNTER — Encounter (HOSPITAL_COMMUNITY): Payer: Self-pay | Admitting: *Deleted

## 2015-07-24 DIAGNOSIS — N186 End stage renal disease: Secondary | ICD-10-CM | POA: Diagnosis not present

## 2015-07-24 DIAGNOSIS — T827XXD Infection and inflammatory reaction due to other cardiac and vascular devices, implants and grafts, subsequent encounter: Secondary | ICD-10-CM | POA: Diagnosis not present

## 2015-07-24 DIAGNOSIS — E1165 Type 2 diabetes mellitus with hyperglycemia: Secondary | ICD-10-CM | POA: Diagnosis not present

## 2015-07-24 DIAGNOSIS — I120 Hypertensive chronic kidney disease with stage 5 chronic kidney disease or end stage renal disease: Secondary | ICD-10-CM | POA: Diagnosis not present

## 2015-07-24 DIAGNOSIS — G4733 Obstructive sleep apnea (adult) (pediatric): Secondary | ICD-10-CM | POA: Diagnosis not present

## 2015-07-24 NOTE — Progress Notes (Signed)
Pt denies SOB, chest pain, and being under the care of a cardiologist. Pt stated that an EKG was recently done in the ED; no record in Epic. Spoke with Judeth CornfieldStephanie, from MD's office, regarding pt HS insulin instructions and dialysis location on the DOS. [facility], pt should have dialysis at his regular dialysis location after procedure and pt was previously instructed to take half of his Lantus insulin at bedtime with a high protein snack. Pt stated that his Lantus was increased to 70 units at bedtime; pt instructed to " take half of scheduled dose which is 35 units." Pt instructed not to take any insulin the AM of procedure since NPO. Pt verbalized understanding of all pre-op instructions. Pt chart reviewed by Revonda StandardAllison, PA, anesthesia.

## 2015-07-24 NOTE — Progress Notes (Signed)
Anesthesia Chart Review: SAME DAY WORK-UP.  Patient is a 49 year old male scheduled for insertion of dialysis catheter tomorrow (first case) by Dr. Hart RochesterLawson.  He is s/p tunneled RIJ HD catheter on 06/25/15 and I&D LUE with ligation of LUE infected basilic vein transposition AVF 06/20/15 [facility]). [facility] and was hospitalized with plans to operate once his INR was within an acceptable range for surgery. The patient left AMA (reportedly because he [facility]) and [facility]. On Hospital day #2 he developed progressive somnolence and severe hypercarbia requiring transfer to ICU and intubation. Extubated on 06/21/15. Discharged on 06/26/15 with home O2 2L and encouraged to be compliant with CPAP. He was treated with IV antibiotics. Blood cultures were negative. He was requiring HD 4X/week at discharge (MTTS). He was seen in the ED yesterday after accidentally pulling out his HD catheter.  Other history includes ESRD, OSA with CPAP non-compliance, anemia, seizures, HTN, DVT BLE and PE '11 (on warfarin), DM2, chronic pancreatitis, secondary hyperparathyroidism, cardiomegaly.  Nephrologist is Dr. Arrie Aranoladonato. PCP is listed as Dr. Darrow Bussingibas Koirala. Pulmonologist is Dr. Nicholos Johnsamachandran.  Meds iallopurinol, Lipitor, Phoslo, Coreg, Sensipar, Klonopin, Neurontin, Novolog 70/30, Lantus, Prilosec, warfarin. He held warfarin starting 07/23/15.  Last EKG in Epic/Muse was from 01/06/10 and showed ST, LAD.   01/07/10 Echo (in the setting of large PE in both pulmonary arteries): Study Conclusions - Left ventricle: The cavity size was normal. Systolic function was  normal. The estimated ejection fraction was in the range of 60% to 65%. Wall motion was normal; there were no regional wall motion abnormalities. - Right ventricle: The cavity size was moderately dilated. Systolic function was reduced. - Right atrium: The atrium was  mildly dilated. - Atrial septum: No defect or patent foramen ovale was identified. - Pulmonary arteries: Systolic pressure was moderately to severely increased. PA peak pressure: 65mm Hg (S). Impressions: Findings are highly consistent with acute cor pulmonale (i.e. pulmonary embolism). However, the moderate to severe increase in PA pressure and the absence of inferior vena cava dilatation suggest there is also a chronic component(e.g. COPD, sleep apnea, chronic thromboembolic disease, etc.).  06/24/15 CXR: IMPRESSION: 1. Stable mild cardiomegaly. Mild residual pulmonary edema, significantly decreased. 2. Mildly prominent main pulmonary artery contour, which could indicate pulmonary hypertension.  He is for EKG and labs including PT/INR on arrival.   Patient needs HD catheter inserted so he can resume dialysis. Anesthesiologist and surgeon to follow-up on labs tomorrow. He is morbidly obese with non-compliance with CPAP and acute respiratory failure requiring intubation last month. Severe pulmonary hypertension by echo in 2011 but was also done in the setting of acute PE. May need to consider overnight stay, particularly if he is still not being compliant with CPAP.  Velna Ochsllison Marymargaret Kirker, PA-C [facility] Stay [facility]/Anesthesiology Phone [phone] 07/24/2015 4:55 PM

## 2015-07-24 NOTE — Progress Notes (Signed)
Revonda StandardAllison, [place], anesthesia, asked to review pt history.

## 2015-07-24 NOTE — Progress Notes (Signed)
Pt stated that he took his last dose of Coumadin on 07/22/15. LVM with Judeth CornfieldStephanie regarding pt having dialysis after catheter insertion ( waiting for return call). Pt stated that he will call and schedule a visit with is dialysis center meanwhile. Pt stated that he does not take all of his medications in the AM on dialysis days ( Coreg) ; instructions revised according to anesthesia protocol.

## 2015-07-25 ENCOUNTER — Ambulatory Visit (HOSPITAL_COMMUNITY): Payer: Medicare Other | Admitting: Vascular Surgery

## 2015-07-25 ENCOUNTER — Ambulatory Visit (HOSPITAL_COMMUNITY)
Admission: RE | Admit: 2015-07-25 | Discharge: 2015-07-25 | Disposition: A | Discharge status: Home / Self Care | Payer: Medicare Other | Source: Ambulatory Visit | Attending: Vascular Surgery | Admitting: Vascular Surgery

## 2015-07-25 ENCOUNTER — Encounter (HOSPITAL_COMMUNITY): Payer: Self-pay | Admitting: *Deleted

## 2015-07-25 ENCOUNTER — Encounter (HOSPITAL_COMMUNITY)
Admission: RE | Disposition: A | Discharge status: Home / Self Care | Payer: Self-pay | Source: Ambulatory Visit | Attending: Vascular Surgery

## 2015-07-25 ENCOUNTER — Ambulatory Visit (HOSPITAL_COMMUNITY): Payer: Medicare Other

## 2015-07-25 DIAGNOSIS — Z6841 Body Mass Index (BMI) 40.0 and over, adult: Secondary | ICD-10-CM | POA: Diagnosis not present

## 2015-07-25 DIAGNOSIS — I2782 Chronic pulmonary embolism: Secondary | ICD-10-CM | POA: Diagnosis not present

## 2015-07-25 DIAGNOSIS — Z9119 Patient's noncompliance with other medical treatment and regimen: Secondary | ICD-10-CM | POA: Diagnosis not present

## 2015-07-25 DIAGNOSIS — J811 Chronic pulmonary edema: Secondary | ICD-10-CM | POA: Diagnosis not present

## 2015-07-25 DIAGNOSIS — Z86718 Personal history of other venous thrombosis and embolism: Secondary | ICD-10-CM | POA: Insufficient documentation

## 2015-07-25 DIAGNOSIS — Z886 Allergy status to analgesic agent status: Secondary | ICD-10-CM | POA: Diagnosis not present

## 2015-07-25 DIAGNOSIS — D649 Anemia, unspecified: Secondary | ICD-10-CM | POA: Diagnosis not present

## 2015-07-25 DIAGNOSIS — Z86711 Personal history of pulmonary embolism: Secondary | ICD-10-CM | POA: Diagnosis not present

## 2015-07-25 DIAGNOSIS — I2609 Other pulmonary embolism with acute cor pulmonale: Secondary | ICD-10-CM | POA: Diagnosis not present

## 2015-07-25 DIAGNOSIS — E1122 Type 2 diabetes mellitus with diabetic chronic kidney disease: Secondary | ICD-10-CM | POA: Insufficient documentation

## 2015-07-25 DIAGNOSIS — I517 Cardiomegaly: Secondary | ICD-10-CM | POA: Diagnosis not present

## 2015-07-25 DIAGNOSIS — I272 Other secondary pulmonary hypertension: Secondary | ICD-10-CM | POA: Diagnosis not present

## 2015-07-25 DIAGNOSIS — N186 End stage renal disease: Secondary | ICD-10-CM | POA: Insufficient documentation

## 2015-07-25 DIAGNOSIS — I120 Hypertensive chronic kidney disease with stage 5 chronic kidney disease or end stage renal disease: Secondary | ICD-10-CM | POA: Insufficient documentation

## 2015-07-25 DIAGNOSIS — D631 Anemia in chronic kidney disease: Secondary | ICD-10-CM | POA: Diagnosis not present

## 2015-07-25 DIAGNOSIS — G4733 Obstructive sleep apnea (adult) (pediatric): Secondary | ICD-10-CM | POA: Insufficient documentation

## 2015-07-25 DIAGNOSIS — Z7901 Long term (current) use of anticoagulants: Secondary | ICD-10-CM | POA: Diagnosis not present

## 2015-07-25 DIAGNOSIS — L0390 Cellulitis, unspecified: Secondary | ICD-10-CM | POA: Diagnosis not present

## 2015-07-25 DIAGNOSIS — N2581 Secondary hyperparathyroidism of renal origin: Secondary | ICD-10-CM | POA: Insufficient documentation

## 2015-07-25 DIAGNOSIS — Z452 Encounter for adjustment and management of vascular access device: Secondary | ICD-10-CM

## 2015-07-25 DIAGNOSIS — D689 Coagulation defect, unspecified: Secondary | ICD-10-CM | POA: Diagnosis not present

## 2015-07-25 HISTORY — PX: INSERTION OF DIALYSIS CATHETER: SHX1324

## 2015-07-25 LAB — POCT I-STAT 4, (NA,K, GLUC, HGB,HCT)
Glucose, Bld: 145 mg/dL — ABNORMAL HIGH (ref 65–99)
HEMATOCRIT: 39 % (ref 39.0–52.0)
Hemoglobin: 13.3 g/dL (ref 13.0–17.0)
POTASSIUM: 4.5 mmol/L (ref 3.5–5.1)
SODIUM: 139 mmol/L (ref 135–145)

## 2015-07-25 SURGERY — INSERTION OF DIALYSIS CATHETER
Anesthesia: Monitor Anesthesia Care | Site: Neck | Laterality: Left

## 2015-07-25 MED ORDER — DEXTROSE 5 % IV SOLN
1.5000 g | INTRAVENOUS | Status: AC
  Administered 2015-07-25: 1.5 g via INTRAVENOUS
  Filled 2015-07-25: qty 1.5

## 2015-07-25 MED ORDER — CHLORHEXIDINE GLUCONATE CLOTH 2 % EX PADS: 6.0000 | MEDICATED_PAD | Freq: Once | CUTANEOUS | Status: DC

## 2015-07-25 MED ORDER — SUCCINYLCHOLINE CHLORIDE 20 MG/ML IJ SOLN
INTRAMUSCULAR | Status: AC
  Filled 2015-07-25: qty 1

## 2015-07-25 MED ORDER — EPHEDRINE SULFATE 50 MG/ML IJ SOLN
INTRAMUSCULAR | Status: AC
  Filled 2015-07-25: qty 1

## 2015-07-25 MED ORDER — MIDAZOLAM HCL 2 MG/2ML IJ SOLN
INTRAMUSCULAR | Status: AC
  Filled 2015-07-25: qty 4

## 2015-07-25 MED ORDER — SODIUM CHLORIDE 0.9 % IJ SOLN
INTRAMUSCULAR | Status: AC
  Filled 2015-07-25: qty 10

## 2015-07-25 MED ORDER — OXYCODONE HCL 5 MG PO TABS: 5.0000 mg | ORAL_TABLET | Freq: Once | ORAL | Status: DC | PRN

## 2015-07-25 MED ORDER — FENTANYL CITRATE (PF) 100 MCG/2ML IJ SOLN: 25.0000 ug | INTRAMUSCULAR | Status: DC | PRN

## 2015-07-25 MED ORDER — LIDOCAINE HCL (PF) 1 % IJ SOLN
INTRAMUSCULAR | Status: DC | PRN
  Administered 2015-07-25: 30 mL

## 2015-07-25 MED ORDER — FENTANYL CITRATE (PF) 250 MCG/5ML IJ SOLN
INTRAMUSCULAR | Status: AC
  Filled 2015-07-25: qty 5

## 2015-07-25 MED ORDER — 0.9 % SODIUM CHLORIDE (POUR BTL) OPTIME
TOPICAL | Status: DC | PRN
  Administered 2015-07-25: 1000 mL

## 2015-07-25 MED ORDER — LIDOCAINE HCL (PF) 1 % IJ SOLN
INTRAMUSCULAR | Status: AC
  Filled 2015-07-25: qty 30

## 2015-07-25 MED ORDER — SODIUM CHLORIDE 0.9 % IV SOLN
INTRAVENOUS | Status: DC
  Administered 2015-07-25: 07:00:00 via INTRAVENOUS

## 2015-07-25 MED ORDER — LIDOCAINE HCL (CARDIAC) 20 MG/ML IV SOLN
INTRAVENOUS | Status: AC
  Filled 2015-07-25: qty 5

## 2015-07-25 MED ORDER — HEPARIN SODIUM (PORCINE) 1000 UNIT/ML IJ SOLN
INTRAMUSCULAR | Status: DC | PRN
  Administered 2015-07-25: 5 mL via INTRAVENOUS

## 2015-07-25 MED ORDER — ONDANSETRON HCL 4 MG/2ML IJ SOLN: 4.0000 mg | Freq: Once | INTRAMUSCULAR | Status: DC | PRN

## 2015-07-25 MED ORDER — SODIUM CHLORIDE 0.9 % IV SOLN
INTRAVENOUS | Status: DC | PRN
  Administered 2015-07-25: 500 mL

## 2015-07-25 MED ORDER — CARVEDILOL 3.125 MG PO TABS
ORAL_TABLET | ORAL | Status: AC
  Filled 2015-07-25: qty 1

## 2015-07-25 MED ORDER — PROPOFOL 10 MG/ML IV BOLUS
INTRAVENOUS | Status: AC
  Filled 2015-07-25: qty 20

## 2015-07-25 MED ORDER — CARVEDILOL 3.125 MG PO TABS
3.1250 mg | ORAL_TABLET | Freq: Once | ORAL | Status: AC
  Administered 2015-07-25: 3.125 mg via ORAL
  Filled 2015-07-25: qty 1

## 2015-07-25 MED ORDER — OXYCODONE HCL 5 MG/5ML PO SOLN: 5.0000 mg | Freq: Once | ORAL | Status: DC | PRN

## 2015-07-25 MED ORDER — HEPARIN SODIUM (PORCINE) 1000 UNIT/ML IJ SOLN
INTRAMUSCULAR | Status: AC
  Filled 2015-07-25: qty 1

## 2015-07-25 SURGICAL SUPPLY — 44 items
BAG DECANTER FOR FLEXI CONT (MISCELLANEOUS) ×3 IMPLANT
BIOPATCH RED 1 DISK 7.0 (GAUZE/BANDAGES/DRESSINGS) ×2 IMPLANT
BIOPATCH RED 1IN DISK 7.0MM (GAUZE/BANDAGES/DRESSINGS) ×1
BIOPATCH WHT 1IN DISK W/4.0 H (GAUZE/BANDAGES/DRESSINGS) ×3 IMPLANT
CATH CANNON HEMO 15F 50CM (CATHETERS) IMPLANT
CATH CANNON HEMO 15FR 19 (HEMODIALYSIS SUPPLIES) IMPLANT
CATH CANNON HEMO 15FR 23CM (HEMODIALYSIS SUPPLIES) ×3 IMPLANT
CATH CANNON HEMO 15FR 31CM (HEMODIALYSIS SUPPLIES) IMPLANT
CATH CANNON HEMO 15FR 32CM (HEMODIALYSIS SUPPLIES) ×3 IMPLANT
COVER PROBE W GEL 5X96 (DRAPES) ×3 IMPLANT
COVER SURGICAL LIGHT HANDLE (MISCELLANEOUS) ×3 IMPLANT
DECANTER SPIKE VIAL GLASS SM (MISCELLANEOUS) IMPLANT
DRAPE C-ARM 42X72 X-RAY (DRAPES) IMPLANT
DRAPE CHEST BREAST 15X10 FENES (DRAPES) ×3 IMPLANT
DRSG TEGADERM 2-3/8X2-3/4 SM (GAUZE/BANDAGES/DRESSINGS) ×3 IMPLANT
GAUZE SPONGE 2X2 8PLY STRL LF (GAUZE/BANDAGES/DRESSINGS) ×1 IMPLANT
GAUZE SPONGE 4X4 12PLY STRL (GAUZE/BANDAGES/DRESSINGS) ×3 IMPLANT
GAUZE SPONGE 4X4 16PLY XRAY LF (GAUZE/BANDAGES/DRESSINGS) ×6 IMPLANT
GLOVE BIOGEL PI IND STRL 6.5 (GLOVE) ×2 IMPLANT
GLOVE BIOGEL PI INDICATOR 6.5 (GLOVE) ×4
GLOVE ECLIPSE 6.5 STRL STRAW (GLOVE) ×3 IMPLANT
GLOVE SS BIOGEL STRL SZ 7 (GLOVE) ×1 IMPLANT
GLOVE SUPERSENSE BIOGEL SZ 7 (GLOVE) ×2
GOWN STRL REUS W/ TWL LRG LVL3 (GOWN DISPOSABLE) ×2 IMPLANT
GOWN STRL REUS W/TWL LRG LVL3 (GOWN DISPOSABLE) ×4
KIT BASIN OR (CUSTOM PROCEDURE TRAY) ×3 IMPLANT
KIT ROOM TURNOVER OR (KITS) ×3 IMPLANT
NEEDLE 18GX1X1/2 (RX/OR ONLY) (NEEDLE) ×3 IMPLANT
NEEDLE 22X1 1/2 (OR ONLY) (NEEDLE) ×3 IMPLANT
NEEDLE HYPO 25GX1X1/2 BEV (NEEDLE) ×3 IMPLANT
NS IRRIG 1000ML POUR BTL (IV SOLUTION) ×3 IMPLANT
PACK SURGICAL SETUP 50X90 (CUSTOM PROCEDURE TRAY) ×3 IMPLANT
PAD ARMBOARD 7.5X6 YLW CONV (MISCELLANEOUS) ×6 IMPLANT
SOAP 2 % CHG 4 OZ (WOUND CARE) ×3 IMPLANT
SPONGE GAUZE 2X2 STER 10/PKG (GAUZE/BANDAGES/DRESSINGS) ×2
SPONGE GAUZE 4X4 12PLY STER LF (GAUZE/BANDAGES/DRESSINGS) ×3 IMPLANT
SUT ETHILON 3 0 PS 1 (SUTURE) ×3 IMPLANT
SUT VICRYL 4-0 PS2 18IN ABS (SUTURE) ×6 IMPLANT
SYR 20CC LL (SYRINGE) ×3 IMPLANT
SYR 5ML LL (SYRINGE) ×6 IMPLANT
SYR CONTROL 10ML LL (SYRINGE) ×3 IMPLANT
SYRINGE 10CC LL (SYRINGE) ×3 IMPLANT
TAPE CLOTH SURG 4X10 WHT LF (GAUZE/BANDAGES/DRESSINGS) ×6 IMPLANT
WATER STERILE IRR 1000ML POUR (IV SOLUTION) IMPLANT

## 2015-07-25 NOTE — Anesthesia Preprocedure Evaluation (Signed)
Anesthesia Evaluation  Patient identified by MRN, date of birth, ID band Patient awake    Reviewed: Allergy & Precautions, NPO status , Patient's Chart, lab work & pertinent test results  Airway Mallampati: III  TM Distance: >3 FB     Dental  (+) Teeth Intact   Pulmonary     + decreased breath sounds      Cardiovascular hypertension,  Rhythm:Regular Rate:Normal     Neuro/Psych    GI/Hepatic   Endo/Other  diabetes  Renal/GU      Musculoskeletal   Abdominal (+) + obese,   Peds  Hematology   Anesthesia Other Findings   Reproductive/Obstetrics                             Anesthesia Physical Anesthesia Plan  ASA: III  Anesthesia Plan: MAC   Post-op Pain Management:    Induction: Intravenous  Airway Management Planned: Natural Airway and Simple Face Mask  Additional Equipment:   Intra-op Plan:   Post-operative Plan:   Informed Consent: I have reviewed the patients History and Physical, chart, labs and discussed the procedure including the risks, benefits and alternatives for the proposed anesthesia with the patient or authorized representative who has indicated his/her understanding and acceptance.     Plan Discussed with: CRNA and Anesthesiologist  Anesthesia Plan Comments: (ESRD,  OSA with CPAP non-compliance,  anemia,  seizures,  HTN,  DVT BLE and PE '11 (on warfarin),  DM2,  chronic pancreatitis,  secondary hyperparathyroidism,  cardiomegaly)        Anesthesia Quick Evaluation

## 2015-07-25 NOTE — Progress Notes (Signed)
Pt with wound vac to left upper arm due to infected graft. Vac system on and dressing intact.

## 2015-07-25 NOTE — Interval H&P Note (Signed)
History and Physical Interval Note:  07/25/2015 7:42 AM  Danise Minaeginald D Lacaze  has presented today for surgery, with the diagnosis of End Stage Renal Disease N18.6  The various methods of treatment have been discussed with the patient and family. After consideration of risks, benefits and other options for treatment, the patient has consented to  Procedure(s): INSERTION OF DIALYSIS CATHETER (N/A) as a surgical intervention .  The patient's history has been reviewed, patient examined, no change in status, stable for surgery.  I have reviewed the patient's chart and labs.  Questions were answered to the patient's satisfaction.    Patient inadvertently pulled out his hemodialysis catheter from right IJ. He is on chronic Coumadin with INR of 2.0 on October 18. He has not had Coumadin now in 3 days. What has been drawn through his dialysis catheter to check INRs. Very difficult to obtain blood. Will proceed with insertion of dialysis catheter. Assume that a INR is now in the 1.5-1.6 range which is satisfactory. Discussed with patient and he is agreeable   Josephina GipLawson, Twan Harkin

## 2015-07-25 NOTE — H&P (View-Only) (Signed)
Anesthesia Chart Review: SAME DAY WORK-UP.  Patient is a 49 year old male scheduled for insertion of dialysis catheter tomorrow (first case) by Dr. Lawson.  He is s/p tunneled RIJ HD catheter on 06/25/15 and I&D LUE with ligation of LUE infected basilic vein transposition AVF 06/20/15 ([facility]). He had initially presented to [facility] with AVF abscess and was hospitalized with plans to operate once his INR was within an acceptable range for surgery. The patient left AMA (reportedly because he wanted his vascular surgeon from [place] to operate) and was readmitted to [facility] on 06/18/15. On Hospital day #2 he developed progressive somnolence and severe hypercarbia requiring transfer to ICU and intubation. Extubated on 06/21/15. Discharged on 06/26/15 with home O2 2L and encouraged to be compliant with CPAP. He was treated with IV antibiotics. Blood cultures were negative. He was requiring HD 4X/week at discharge (MTTS). He was seen in the ED yesterday after accidentally pulling out his HD catheter.  Other history includes ESRD, OSA with CPAP non-compliance, anemia, seizures, HTN, DVT BLE and PE '11 (on warfarin), DM2, chronic pancreatitis, secondary hyperparathyroidism, cardiomegaly.  Nephrologist is Dr. Coladonato. PCP is listed as Dr. Dibas Koirala. Pulmonologist is Dr. Ramachandran.  Meds iallopurinol, Lipitor, Phoslo, Coreg, Sensipar, Klonopin, Neurontin, Novolog 70/30, Lantus, Prilosec, warfarin. He held warfarin starting 07/23/15.  Last EKG in Epic/Muse was from 01/06/10 and showed ST, LAD.   01/07/10 Echo (in the setting of large PE in both pulmonary arteries): Study Conclusions - Left ventricle: The cavity size was normal. Systolic function was  normal. The estimated ejection fraction was in the range of 60% to 65%. Wall motion was normal; there were no regional wall motion abnormalities. - Right ventricle: The cavity size was moderately dilated. Systolic function was reduced. - Right atrium: The atrium was  mildly dilated. - Atrial septum: No defect or patent foramen ovale was identified. - Pulmonary arteries: Systolic pressure was moderately to severely increased. PA peak pressure: 65mm Hg (S). Impressions: Findings are highly consistent with acute cor pulmonale (i.e. pulmonary embolism). However, the moderate to severe increase in PA pressure and the absence of inferior vena cava dilatation suggest there is also a chronic component(e.g. COPD, sleep apnea, chronic thromboembolic disease, etc.).  06/24/15 CXR: IMPRESSION: 1. Stable mild cardiomegaly. Mild residual pulmonary edema, significantly decreased. 2. Mildly prominent main pulmonary artery contour, which could indicate pulmonary hypertension.  He is for EKG and labs including PT/INR on arrival.   Patient needs HD catheter inserted so he can resume dialysis. Anesthesiologist and surgeon to follow-up on labs tomorrow. He is morbidly obese with non-compliance with CPAP and acute respiratory failure requiring intubation last month. Severe pulmonary hypertension by echo in 2011 but was also done in the setting of acute PE. May need to consider overnight stay, particularly if he is still not being compliant with CPAP.  Verdell Dykman, PA-C [facility] Short Stay Center/Anesthesiology Phone [phone] 07/24/2015 4:55 PM   

## 2015-07-25 NOTE — Op Note (Signed)
OPERATIVE REPORT  Date of Surgery: 07/25/2015  Surgeon: Shane GipJames Neelah Mannings, MD  Assistant: Nurse  Pre-op Diagnosis: End Stage Renal Disease N18.6  Post-op Diagnosis: End Stage Renal Disease N18.6  Procedure: Procedure(s): INSERTION OF LEFT INTERNAL JUGULAR DIALYSIS CATHETER-27 cm Bilateral ultrasound localization internal jugular veins  Anesthesia: Mac  EBL: Minimal  Complications: None  Procedure Details: The patient was taken to the operating room placed in the supine position. Upper chest and neck was exposed. Internal jugular veins were imaged using B mode ultrasound-sinus site. The left was larger than the right. The right IJ is where the catheter had been recently inadvertently removed by the patient. After prepping and draping in routine sterile manner the left IJ was entered using ultrasound guidance with a single puncture. Guidewire was passed into the right atrium under fluoroscopic guidance using the Zeego. After dilating the tract appropriately a 27 cm cuffed hemodialysis catheter was positioned in the right atrium tunneled peripherally and secured with nylon sutures. The wound was closed with Vicryl in a subcuticular fashion plus Dermabond patient taken recovery room in satisfactory condition for chest x-ray. Patient received no intravenous sedation during the procedure only local anesthesia.   Shane GipJames Davaughn Hillyard, MD 07/25/2015 8:46 AM

## 2015-07-25 NOTE — Progress Notes (Signed)
Per Dr. Noreene LarssonJoslin, he will speak with Dr. Hart RochesterLawson regarding inability to draw blood for PT/PTT and would draw it after pt asleep if necessary to avoid more sticks for pt. Difficult IV insertion but was able to get enough blood for istat.

## 2015-07-25 NOTE — Discharge Instructions (Signed)
Central Line Dialysis Access Placement, Care After °Refer to this sheet after your procedure. These instructions provide you with information on caring for yourself after your procedure. Your health care provider may also give you more specific instructions. Your treatment has been planned according to current medical practices, but problems sometimes occur. Call your health care provider if you have any problems or questions after your procedure.  °WHAT TO EXPECT AFTER THE PROCEDURE °· You may feel some discomfort after the local anesthetic wears off. Your discomfort should gradually improve over the next several days. Ask your health care provider if you can take pain medicines. °· You may notice some redness, mild pain, swelling, bruising, or light bleeding at the site where the thin, flexible tube (catheter) was placed. These should improve over the next several days. °HOME CARE INSTRUCTIONS  °· Rest for the remainder of the day. °· Avoid any heavy lifting (more than 10 lb [4.5 kg]) for at least 3 days.   °· If your catheter bandage (dressing) becomes soaked with blood, apply firm and direct pressure on the insertion site and sit up for 20 minutes. Your dialysis nurses will change your dressing during your next visit or at your next dialysis treatment. °· Keep the dressing around the insertion site dry. Avoid using the shower. You may take a bath after 24 hours. Bathe in a tub and keep the dressing and catheter above the level of the water. Do not submerge catheter underwater, such as by swimming. °· You may resume your usual diet.  °· Do not operate heavy machinery, drive, or make legal decisions for the first 24 hours after the procedure if you were given sedatives or other medicines to help you relax.   °SEEK MEDICAL CARE IF: °· The catheter gets pulled or comes out. °· You develop any signs of infection around the insertion site such as:   °¨ Bleeding that does not stop even after applying pressure as  instructed. °¨ Increased pain. °¨ Unusual drainage such as pus.   °· You develop a fever or chills.   °· You have any other questions or concerns related to your procedure or the care of your central line.   °SEEK IMMEDIATE MEDICAL CARE IF: °· You develop lightheadedness or dizziness.   °· You faint.   °· You develop shortness of breath or difficulty breathing.   °· You have any symptoms of an allergic reaction, such as: °¨ Itching.   °¨ Rash at the site of insertion. °  °This information is not intended to replace advice given to you by your health care provider. Make sure you discuss any questions you have with your health care provider. °  °Document Released: 05/05/2004 Document Revised: 09/26/2013 Document Reviewed: 06/30/2012 °Elsevier Interactive Patient Education ©2016 [employer] ° ° °

## 2015-07-25 NOTE — Addendum Note (Signed)
Addendum  created 07/25/15 1443 by Coralee Rudobert Ryla Cauthon, CRNA   Modules edited: Anesthesia Events, Narrator   Narrator:  Narrator: Event Log Edited

## 2015-07-25 NOTE — Anesthesia Postprocedure Evaluation (Signed)
  Anesthesia Post-op Note  Patient: Shane MinaReginald D Lince  Procedure(s) Performed: Procedure(s): INSERTION OF LEFT INTERNAL JUGULAR DIALYSIS CATHETER (Left)  Patient Location: PACU  Anesthesia Type:MAC  Level of Consciousness: awake, alert  and oriented  Airway and Oxygen Therapy: Patient Spontanous Breathing  Post-op Pain: mild  Post-op Assessment: Post-op Vital signs reviewed, Patient's Cardiovascular Status Stable, Respiratory Function Stable, Patent Airway and Pain level controlled              Post-op Vital Signs: stable  Last Vitals:  Filed Vitals:   07/25/15 0930  BP:   Pulse: 94  Temp:   Resp: 22    Complications: No apparent anesthesia complications

## 2015-07-25 NOTE — Transfer of Care (Signed)
Immediate Anesthesia Transfer of Care Note  Patient: Shane Hamilton  Procedure(s) Performed: Procedure(s): INSERTION OF LEFT INTERNAL JUGULAR DIALYSIS CATHETER (Left)  Patient Location: PACU  Anesthesia Type:MAC  Level of Consciousness: awake, alert , oriented and patient cooperative  Airway & Oxygen Therapy: Patient Spontanous Breathing and Patient connected to nasal cannula oxygen  Post-op Assessment: Report given to RN, Post -op Vital signs reviewed and stable and Patient moving all extremities  Post vital signs: Reviewed and stable  Last Vitals:  Filed Vitals:   07/25/15 0647  BP:   Pulse:   Temp: 36.8 C  Resp:     Complications: No apparent anesthesia complications

## 2015-07-26 ENCOUNTER — Encounter (HOSPITAL_COMMUNITY): Payer: Self-pay | Admitting: Vascular Surgery

## 2015-07-26 ENCOUNTER — Encounter: Payer: Medicare Other | Admitting: Internal Medicine

## 2015-07-26 ENCOUNTER — Encounter: Payer: Self-pay | Admitting: *Deleted

## 2015-07-26 DIAGNOSIS — N2581 Secondary hyperparathyroidism of renal origin: Secondary | ICD-10-CM | POA: Diagnosis not present

## 2015-07-26 DIAGNOSIS — D689 Coagulation defect, unspecified: Secondary | ICD-10-CM | POA: Diagnosis not present

## 2015-07-26 DIAGNOSIS — T827XXD Infection and inflammatory reaction due to other cardiac and vascular devices, implants and grafts, subsequent encounter: Secondary | ICD-10-CM | POA: Diagnosis not present

## 2015-07-26 DIAGNOSIS — E1165 Type 2 diabetes mellitus with hyperglycemia: Secondary | ICD-10-CM | POA: Diagnosis not present

## 2015-07-26 DIAGNOSIS — I120 Hypertensive chronic kidney disease with stage 5 chronic kidney disease or end stage renal disease: Secondary | ICD-10-CM | POA: Diagnosis not present

## 2015-07-26 DIAGNOSIS — L0390 Cellulitis, unspecified: Secondary | ICD-10-CM | POA: Diagnosis not present

## 2015-07-26 DIAGNOSIS — N186 End stage renal disease: Secondary | ICD-10-CM | POA: Diagnosis not present

## 2015-07-26 DIAGNOSIS — G4733 Obstructive sleep apnea (adult) (pediatric): Secondary | ICD-10-CM | POA: Diagnosis not present

## 2015-07-26 DIAGNOSIS — D631 Anemia in chronic kidney disease: Secondary | ICD-10-CM | POA: Diagnosis not present

## 2015-07-26 LAB — GLUCOSE, CAPILLARY: GLUCOSE-CAPILLARY: 116 mg/dL — AB (ref 65–99)

## 2015-07-26 NOTE — Progress Notes (Signed)
[facility] [place] [facility]     Assessment and Plan: Obstructive sleep apnea. -Patient admits that he has not been poorly compliant with his home CPAP use, mostly because he is had difficulty in tolerating a full facial mask. The patient is currently using BiPAP here in the hospital, using a nasal mask, which he appears to tolerate well. -The patient is asked to continue his home CPAP and may continue with a nasal mask. -We'll have the patient follow-up outpatient for further sleep evaluation. At that time can reevaluate his tolerance of CPAP and troubleshoot accordingly. Would also consider checking a auto titration test, an overnight oximetry on an outpatient basis. -Follow-up in 2-4 weeks in office.  LUE AVF abscess.  -Doing better, wound VAC in place.  Encephalopathy.  -Resolved.  Respiratory failure.  -Resolved.    Date: 07/26/2015  MRN# [id number] MANN SKAGGS 04-11-1966   [name] is a 49 y.o. old male seen in follow up for chief complaint of  No chief complaint on file.    HPI:  49 y.o. male male with a PMHX of ESRD, Morbid obesity, OSA, Chronic anticoagulation for DVT, SHPTH, was admitted on 06/18/2015 with sepsis, acute resp failure, requiring intubation. She is s/p Excision of infected left arm basilic AV fistula with multiple previously placed stents During that admission, the patient was using BiPAP. She was asked to continue her home CPAP , upon discharge and follow up with [place] for further evaluation.   No flowsheet data found.  Pulmonary Functions Testing Results:  No results found for: FEV1, FVC, FEV1FVC, TLC, DLCO   Medication:   Outpatient Encounter Prescriptions as of 07/26/2015  Medication Sig  . allopurinol (ZYLOPRIM) 300 MG tablet Take 300 mg by mouth 2 (two) times daily.  Marland Kitchen atorvastatin (LIPITOR) 10 MG tablet Take 10 mg by mouth daily.  . calcium acetate (PHOSLO) 667 MG capsule Take 2,001 mg by mouth 3 (three) times daily with  meals.   . carvedilol (COREG) 3.125 MG tablet Take 1 tablet (3.125 mg total) by mouth 2 (two) times daily with a meal.  . ceFAZolin in dextrose 5 % 50 mL ivpb 2 gram IV at the end of each dialysis- for 3 weeks.  . cinacalcet (SENSIPAR) 60 MG tablet Take 60 mg by mouth daily.  . clonazePAM (KLONOPIN) 1 MG tablet Take 1 mg by mouth 2 (two) times daily.  . colchicine 0.6 MG tablet Take 0.6 mg by mouth daily.  Marland Kitchen gabapentin (NEURONTIN) 300 MG capsule Take 300 mg by mouth every 8 (eight) hours.  . hydrOXYzine (ATARAX/VISTARIL) 25 MG tablet Take 25 mg by mouth 3 (three) times daily as needed.  . insulin aspart protamine- aspart (NOVOLOG MIX 70/30) (70-30) 100 UNIT/ML injection Inject 0.1 mLs (10 Units total) into the skin 3 (three) times daily with meals. (Patient taking differently: Inject 35 Units into the skin 3 (three) times daily with meals. )  . insulin glargine (LANTUS) 100 UNIT/ML injection Inject 0.35 mLs (35 Units total) into the skin at bedtime. (Patient taking differently: Inject 70 Units into the skin at bedtime. )  . lanthanum (FOSRENOL) 1000 MG chewable tablet Chew 1,000 mg by mouth 3 (three) times daily with meals.   . multivitamin (RENA-VIT) TABS tablet Take 1 tablet by mouth daily.   Marland Kitchen omeprazole (PRILOSEC) 20 MG capsule Take 20 mg by mouth 2 (two) times daily before a meal.   . Oxycodone HCl 10 MG TABS Take 1 tablet (10 mg total) by mouth  2 (two) times daily as needed (pain).  Marland Kitchen. warfarin (COUMADIN) 7.5 MG tablet Take 7.5 mg by mouth See admin instructions. Pt takes 7.5mg  on Tuesday, Wednesday, Thursday, Saturday and Sunday and 10mg  on Monday and Friday   No facility-administered encounter medications on file as of 07/26/2015.     Allergies:  Aspirin  Review of Systems: Gen:  Denies  fever, sweats. HEENT: Denies blurred vision. Cvc:  No dizziness, chest pain or heaviness Resp:   Denies cough or sputum porduction. Gi: Denies swallowing difficulty, stomach pain. constipation,  bowel incontinence Gu:  Denies bladder incontinence, burning urine Ext:   No Joint pain, stiffness. Skin: No skin rash, easy bruising. Endoc:  No polyuria, polydipsia. Psych: No depression, insomnia. Other:  All other systems were reviewed and found to be negative other than what is mentioned in the HPI.   Physical Examination:   VS: There were no vitals taken for this visit.  General Appearance: No distress  Neuro:without focal findings,  speech normal,  HEENT: PERRLA, EOM intact. Pulmonary: normal breath sounds, No wheezing.   CardiovascularNormal S1,S2.  No m/r/g.   Abdomen: Benign, Soft, non-tender. Renal:  No costovertebral tenderness  GU:  Not performed at this time. Endoc: No evident thyromegaly, no signs of acromegaly. Skin:   warm, no rash. Extremities: normal, no cyanosis, clubbing.   LABORATORY PANEL:   CBC  Recent Labs Lab 07/25/15 0714  HGB 13.3  HCT 39.0   ------------------------------------------------------------------------------------------------------------------  Chemistries   Recent Labs Lab 07/25/15 0714  NA 139  K 4.5  GLUCOSE 145*   ------------------------------------------------------------------------------------------------------------------  Cardiac Enzymes No results for input(s): TROPONINI in the last 168 hours. ------------------------------------------------------------  RADIOLOGY:   No results found for this or any previous visit. Results for orders placed during the hospital encounter of 06/18/15  DG Chest 2 View   Narrative CLINICAL DATA:  Follow-up pulmonary edema. Morbid obesity. Obstructive sleep apnea. End-stage renal disease on hemodialysis.  EXAM: CHEST  2 VIEW  COMPARISON:  06/21/2015 chest radiograph.  FINDINGS: Stable cardiomediastinal silhouette with mild cardiomegaly and mildly prominent main pulmonary artery contour. No pneumothorax. No pleural effusion. There is mild residual pulmonary  edema, significantly decreased. No new focal lung opacity.  IMPRESSION: 1. Stable mild cardiomegaly. Mild residual pulmonary edema, significantly decreased. 2. Mildly prominent main pulmonary artery contour, which could indicate pulmonary hypertension.   Electronically Signed   By: Delbert PhenixJason A Poff M.D.   On: 06/24/2015 14:09    ------------------------------------------------------------------------------------------------------------------  [facility]  [facility] to assist in the care of your patient. Our recommendations are noted above.  Please contact us if we can be of further service.   Wells Guileseep Rohin Krejci, MD.  [facility] Office Number: [phone]  Santiago Gladavid Kasa, M.D.  Stephanie AcreVishal Mungal, M.D.  Billy Fischeravid Simonds, M.D  This encounter was created in error - please disregard.

## 2015-07-27 DIAGNOSIS — N186 End stage renal disease: Secondary | ICD-10-CM | POA: Diagnosis not present

## 2015-07-27 DIAGNOSIS — D631 Anemia in chronic kidney disease: Secondary | ICD-10-CM | POA: Diagnosis not present

## 2015-07-27 DIAGNOSIS — L0390 Cellulitis, unspecified: Secondary | ICD-10-CM | POA: Diagnosis not present

## 2015-07-27 DIAGNOSIS — D689 Coagulation defect, unspecified: Secondary | ICD-10-CM | POA: Diagnosis not present

## 2015-07-27 DIAGNOSIS — N2581 Secondary hyperparathyroidism of renal origin: Secondary | ICD-10-CM | POA: Diagnosis not present

## 2015-07-29 DIAGNOSIS — D631 Anemia in chronic kidney disease: Secondary | ICD-10-CM | POA: Diagnosis not present

## 2015-07-29 DIAGNOSIS — G4733 Obstructive sleep apnea (adult) (pediatric): Secondary | ICD-10-CM | POA: Diagnosis not present

## 2015-07-29 DIAGNOSIS — N186 End stage renal disease: Secondary | ICD-10-CM | POA: Diagnosis not present

## 2015-07-29 DIAGNOSIS — D689 Coagulation defect, unspecified: Secondary | ICD-10-CM | POA: Diagnosis not present

## 2015-07-29 DIAGNOSIS — L0390 Cellulitis, unspecified: Secondary | ICD-10-CM | POA: Diagnosis not present

## 2015-07-29 DIAGNOSIS — T827XXD Infection and inflammatory reaction due to other cardiac and vascular devices, implants and grafts, subsequent encounter: Secondary | ICD-10-CM | POA: Diagnosis not present

## 2015-07-29 DIAGNOSIS — E1165 Type 2 diabetes mellitus with hyperglycemia: Secondary | ICD-10-CM | POA: Diagnosis not present

## 2015-07-29 DIAGNOSIS — I120 Hypertensive chronic kidney disease with stage 5 chronic kidney disease or end stage renal disease: Secondary | ICD-10-CM | POA: Diagnosis not present

## 2015-07-29 DIAGNOSIS — N2581 Secondary hyperparathyroidism of renal origin: Secondary | ICD-10-CM | POA: Diagnosis not present

## 2015-07-30 DIAGNOSIS — D631 Anemia in chronic kidney disease: Secondary | ICD-10-CM | POA: Diagnosis not present

## 2015-07-30 DIAGNOSIS — N2581 Secondary hyperparathyroidism of renal origin: Secondary | ICD-10-CM | POA: Diagnosis not present

## 2015-07-30 DIAGNOSIS — L0390 Cellulitis, unspecified: Secondary | ICD-10-CM | POA: Diagnosis not present

## 2015-07-30 DIAGNOSIS — D689 Coagulation defect, unspecified: Secondary | ICD-10-CM | POA: Diagnosis not present

## 2015-07-30 DIAGNOSIS — N186 End stage renal disease: Secondary | ICD-10-CM | POA: Diagnosis not present

## 2015-07-31 ENCOUNTER — Encounter (HOSPITAL_COMMUNITY): Payer: Self-pay | Admitting: *Deleted

## 2015-07-31 ENCOUNTER — Other Ambulatory Visit: Payer: Self-pay | Admitting: *Deleted

## 2015-07-31 DIAGNOSIS — I120 Hypertensive chronic kidney disease with stage 5 chronic kidney disease or end stage renal disease: Secondary | ICD-10-CM | POA: Diagnosis not present

## 2015-07-31 DIAGNOSIS — N186 End stage renal disease: Secondary | ICD-10-CM | POA: Diagnosis not present

## 2015-07-31 DIAGNOSIS — T827XXD Infection and inflammatory reaction due to other cardiac and vascular devices, implants and grafts, subsequent encounter: Secondary | ICD-10-CM | POA: Diagnosis not present

## 2015-07-31 DIAGNOSIS — G4733 Obstructive sleep apnea (adult) (pediatric): Secondary | ICD-10-CM | POA: Diagnosis not present

## 2015-07-31 DIAGNOSIS — E1165 Type 2 diabetes mellitus with hyperglycemia: Secondary | ICD-10-CM | POA: Diagnosis not present

## 2015-07-31 NOTE — Progress Notes (Signed)
Had surgery last week here. Denies any chest pain or sob since then.

## 2015-08-01 ENCOUNTER — Ambulatory Visit (HOSPITAL_COMMUNITY): Payer: Medicare Other | Admitting: Anesthesiology

## 2015-08-01 ENCOUNTER — Encounter (HOSPITAL_COMMUNITY)
Admission: RE | Disposition: A | Discharge status: Home / Self Care | Payer: Self-pay | Source: Ambulatory Visit | Attending: Vascular Surgery

## 2015-08-01 ENCOUNTER — Ambulatory Visit (HOSPITAL_COMMUNITY)
Admission: RE | Admit: 2015-08-01 | Discharge: 2015-08-01 | Disposition: A | Discharge status: Home / Self Care | Payer: Medicare Other | Source: Ambulatory Visit | Attending: Vascular Surgery | Admitting: Vascular Surgery

## 2015-08-01 ENCOUNTER — Ambulatory Visit (HOSPITAL_COMMUNITY): Payer: Medicare Other

## 2015-08-01 DIAGNOSIS — N186 End stage renal disease: Secondary | ICD-10-CM | POA: Diagnosis not present

## 2015-08-01 DIAGNOSIS — D631 Anemia in chronic kidney disease: Secondary | ICD-10-CM | POA: Diagnosis not present

## 2015-08-01 DIAGNOSIS — Y841 Kidney dialysis as the cause of abnormal reaction of the patient, or of later complication, without mention of misadventure at the time of the procedure: Secondary | ICD-10-CM | POA: Diagnosis not present

## 2015-08-01 DIAGNOSIS — Z95828 Presence of other vascular implants and grafts: Secondary | ICD-10-CM

## 2015-08-01 DIAGNOSIS — E1129 Type 2 diabetes mellitus with other diabetic kidney complication: Secondary | ICD-10-CM | POA: Diagnosis not present

## 2015-08-01 DIAGNOSIS — E669 Obesity, unspecified: Secondary | ICD-10-CM | POA: Diagnosis not present

## 2015-08-01 DIAGNOSIS — T8249XA Other complication of vascular dialysis catheter, initial encounter: Secondary | ICD-10-CM | POA: Diagnosis not present

## 2015-08-01 DIAGNOSIS — I120 Hypertensive chronic kidney disease with stage 5 chronic kidney disease or end stage renal disease: Secondary | ICD-10-CM | POA: Diagnosis not present

## 2015-08-01 DIAGNOSIS — I2782 Chronic pulmonary embolism: Secondary | ICD-10-CM | POA: Diagnosis not present

## 2015-08-01 DIAGNOSIS — D689 Coagulation defect, unspecified: Secondary | ICD-10-CM | POA: Diagnosis not present

## 2015-08-01 DIAGNOSIS — E1122 Type 2 diabetes mellitus with diabetic chronic kidney disease: Secondary | ICD-10-CM | POA: Diagnosis not present

## 2015-08-01 DIAGNOSIS — T829XXA Unspecified complication of cardiac and vascular prosthetic device, implant and graft, initial encounter: Secondary | ICD-10-CM | POA: Diagnosis present

## 2015-08-01 DIAGNOSIS — Z992 Dependence on renal dialysis: Secondary | ICD-10-CM | POA: Insufficient documentation

## 2015-08-01 DIAGNOSIS — L0390 Cellulitis, unspecified: Secondary | ICD-10-CM | POA: Diagnosis not present

## 2015-08-01 DIAGNOSIS — Z4901 Encounter for fitting and adjustment of extracorporeal dialysis catheter: Secondary | ICD-10-CM | POA: Diagnosis not present

## 2015-08-01 DIAGNOSIS — T82898A Other specified complication of vascular prosthetic devices, implants and grafts, initial encounter: Secondary | ICD-10-CM | POA: Diagnosis not present

## 2015-08-01 DIAGNOSIS — N2581 Secondary hyperparathyroidism of renal origin: Secondary | ICD-10-CM | POA: Diagnosis not present

## 2015-08-01 HISTORY — PX: INSERTION OF DIALYSIS CATHETER: SHX1324

## 2015-08-01 LAB — PROTIME-INR
INR: 1.48 (ref 0.00–1.49)
Prothrombin Time: 18 seconds — ABNORMAL HIGH (ref 11.6–15.2)

## 2015-08-01 LAB — POCT I-STAT 4, (NA,K, GLUC, HGB,HCT)
Glucose, Bld: 130 mg/dL — ABNORMAL HIGH (ref 65–99)
HEMATOCRIT: 35 % — AB (ref 39.0–52.0)
HEMOGLOBIN: 11.9 g/dL — AB (ref 13.0–17.0)
POTASSIUM: 4.6 mmol/L (ref 3.5–5.1)
SODIUM: 136 mmol/L (ref 135–145)

## 2015-08-01 LAB — GLUCOSE, CAPILLARY: Glucose-Capillary: 108 mg/dL — ABNORMAL HIGH (ref 65–99)

## 2015-08-01 LAB — APTT: APTT: 33 s (ref 24–37)

## 2015-08-01 SURGERY — INSERTION OF DIALYSIS CATHETER
Anesthesia: Monitor Anesthesia Care | Site: Neck | Laterality: Left

## 2015-08-01 MED ORDER — FENTANYL CITRATE (PF) 250 MCG/5ML IJ SOLN
INTRAMUSCULAR | Status: DC | PRN
  Administered 2015-08-01: 100 ug via INTRAVENOUS
  Administered 2015-08-01: 50 ug via INTRAVENOUS

## 2015-08-01 MED ORDER — FENTANYL CITRATE (PF) 250 MCG/5ML IJ SOLN
INTRAMUSCULAR | Status: AC
  Filled 2015-08-01: qty 5

## 2015-08-01 MED ORDER — CARVEDILOL 3.125 MG PO TABS
ORAL_TABLET | ORAL | Status: AC
  Administered 2015-08-01: 3.125 mg via ORAL
  Filled 2015-08-01: qty 1

## 2015-08-01 MED ORDER — HEPARIN SODIUM (PORCINE) 5000 UNIT/ML IJ SOLN
INTRAMUSCULAR | Status: DC | PRN
  Administered 2015-08-01: 500 mL

## 2015-08-01 MED ORDER — LIDOCAINE HCL 2 % EX GEL
CUTANEOUS | Status: AC
  Filled 2015-08-01: qty 20

## 2015-08-01 MED ORDER — OXYCODONE-ACETAMINOPHEN 5-325 MG PO TABS: 1.0000 | ORAL_TABLET | Freq: Four times a day (QID) | ORAL | Status: DC | PRN

## 2015-08-01 MED ORDER — LIDOCAINE HCL (PF) 1 % IJ SOLN
INTRAMUSCULAR | Status: AC
  Filled 2015-08-01: qty 30

## 2015-08-01 MED ORDER — SODIUM CHLORIDE 0.9 % IV SOLN
INTRAVENOUS | Status: DC
  Administered 2015-08-01: 10:00:00 via INTRAVENOUS

## 2015-08-01 MED ORDER — CHLORHEXIDINE GLUCONATE CLOTH 2 % EX PADS: 6.0000 | MEDICATED_PAD | Freq: Once | CUTANEOUS | Status: DC

## 2015-08-01 MED ORDER — HEPARIN SODIUM (PORCINE) 1000 UNIT/ML IJ SOLN
INTRAMUSCULAR | Status: DC | PRN
  Administered 2015-08-01: 5 mL

## 2015-08-01 MED ORDER — DEXTROSE 5 % IV SOLN
1.5000 g | INTRAVENOUS | Status: AC
  Administered 2015-08-01: 1.5 g via INTRAVENOUS
  Filled 2015-08-01: qty 1.5

## 2015-08-01 MED ORDER — HEPARIN SODIUM (PORCINE) 1000 UNIT/ML IJ SOLN
INTRAMUSCULAR | Status: AC
  Filled 2015-08-01: qty 1

## 2015-08-01 MED ORDER — LIDOCAINE HCL (PF) 1 % IJ SOLN
INTRAMUSCULAR | Status: DC | PRN
  Administered 2015-08-01: 15 mL

## 2015-08-01 MED ORDER — CARVEDILOL 3.125 MG PO TABS
3.1250 mg | ORAL_TABLET | Freq: Once | ORAL | Status: AC
  Administered 2015-08-01: 3.125 mg via ORAL

## 2015-08-01 SURGICAL SUPPLY — 40 items
BAG DECANTER FOR FLEXI CONT (MISCELLANEOUS) ×3 IMPLANT
BAG SNAP BAND KOVER 36X36 (MISCELLANEOUS) ×3 IMPLANT
BIOPATCH RED 1 DISK 7.0 (GAUZE/BANDAGES/DRESSINGS) ×2 IMPLANT
BIOPATCH RED 1IN DISK 7.0MM (GAUZE/BANDAGES/DRESSINGS) ×1
CATH CANNON HEMO 15F 50CM (CATHETERS) IMPLANT
CATH CANNON HEMO 15FR 19 (HEMODIALYSIS SUPPLIES) IMPLANT
CATH CANNON HEMO 15FR 23CM (HEMODIALYSIS SUPPLIES) IMPLANT
CATH CANNON HEMO 15FR 31CM (HEMODIALYSIS SUPPLIES) IMPLANT
CATH CANNON HEMO 15FR 32CM (HEMODIALYSIS SUPPLIES) ×3 IMPLANT
COVER DOME SNAP 22 D (MISCELLANEOUS) ×3 IMPLANT
COVER PROBE W GEL 5X96 (DRAPES) IMPLANT
COVER SURGICAL LIGHT HANDLE (MISCELLANEOUS) ×3 IMPLANT
DECANTER SPIKE VIAL GLASS SM (MISCELLANEOUS) ×3 IMPLANT
DRAPE C-ARM 42X72 X-RAY (DRAPES) ×3 IMPLANT
DRAPE CHEST BREAST 15X10 FENES (DRAPES) ×3 IMPLANT
GAUZE SPONGE 2X2 8PLY STRL LF (GAUZE/BANDAGES/DRESSINGS) ×1 IMPLANT
GAUZE SPONGE 4X4 16PLY XRAY LF (GAUZE/BANDAGES/DRESSINGS) ×3 IMPLANT
GLOVE SS BIOGEL STRL SZ 7 (GLOVE) ×2 IMPLANT
GLOVE SUPERSENSE BIOGEL SZ 7 (GLOVE) ×4
GOWN STRL REUS W/ TWL LRG LVL3 (GOWN DISPOSABLE) ×2 IMPLANT
GOWN STRL REUS W/TWL LRG LVL3 (GOWN DISPOSABLE) ×4
KIT BASIN OR (CUSTOM PROCEDURE TRAY) ×3 IMPLANT
KIT ROOM TURNOVER OR (KITS) ×3 IMPLANT
LIQUID BAND (GAUZE/BANDAGES/DRESSINGS) ×3 IMPLANT
NEEDLE 18GX1X1/2 (RX/OR ONLY) (NEEDLE) ×3 IMPLANT
NEEDLE 22X1 1/2 (OR ONLY) (NEEDLE) ×3 IMPLANT
NEEDLE HYPO 25GX1X1/2 BEV (NEEDLE) ×3 IMPLANT
NS IRRIG 1000ML POUR BTL (IV SOLUTION) ×3 IMPLANT
PACK SURGICAL SETUP 50X90 (CUSTOM PROCEDURE TRAY) ×3 IMPLANT
PAD ARMBOARD 7.5X6 YLW CONV (MISCELLANEOUS) ×6 IMPLANT
SOAP 2 % CHG 4 OZ (WOUND CARE) ×3 IMPLANT
SPONGE GAUZE 2X2 STER 10/PKG (GAUZE/BANDAGES/DRESSINGS) ×2
SUT ETHILON 3 0 PS 1 (SUTURE) ×3 IMPLANT
SUT VICRYL 4-0 PS2 18IN ABS (SUTURE) ×3 IMPLANT
SYR 20CC LL (SYRINGE) ×3 IMPLANT
SYR 5ML LL (SYRINGE) ×6 IMPLANT
SYR CONTROL 10ML LL (SYRINGE) ×3 IMPLANT
SYRINGE 10CC LL (SYRINGE) ×3 IMPLANT
TAPE CLOTH SURG 4X10 WHT LF (GAUZE/BANDAGES/DRESSINGS) ×3 IMPLANT
WATER STERILE IRR 1000ML POUR (IV SOLUTION) ×3 IMPLANT

## 2015-08-01 NOTE — Anesthesia Preprocedure Evaluation (Addendum)
Anesthesia Evaluation  Patient identified by MRN, date of birth, ID band Patient awake    Reviewed: Allergy & Precautions, NPO status , Patient's Chart, lab work & pertinent test results  Airway Mallampati: III  TM Distance: >3 FB     Dental  (+) Teeth Intact   Pulmonary     + decreased breath sounds      Cardiovascular hypertension,  Rhythm:Regular Rate:Normal     Neuro/Psych    GI/Hepatic   Endo/Other  diabetes  Renal/GU      Musculoskeletal   Abdominal (+) + obese,   Peds  Hematology   Anesthesia Other Findings   Reproductive/Obstetrics                            Anesthesia Physical Anesthesia Plan  ASA: IV  Anesthesia Plan: MAC   Post-op Pain Management:    Induction: Intravenous  Airway Management Planned: Natural Airway and Simple Face Mask  Additional Equipment:   Intra-op Plan:   Post-operative Plan:   Informed Consent: I have reviewed the patients History and Physical, chart, labs and discussed the procedure including the risks, benefits and alternatives for the proposed anesthesia with the patient or authorized representative who has indicated his/her understanding and acceptance.   Dental advisory given  Plan Discussed with: CRNA and Surgeon  Anesthesia Plan Comments:         Anesthesia Quick Evaluation

## 2015-08-01 NOTE — Op Note (Signed)
OPERATIVE REPORT  Date of Surgery: 08/01/2015  Surgeon: Shane GipJames Leith Hedlund, MD  Assistant: Nurse  Pre-op Diagnosis: complication of dialysis catheter, end stage renal disease  Post-op Diagnosis: poorly functioning dialysis catheter, end stage renal disease  Procedure: Procedure(s): REMOVAL OF DIALYSIS CATHETER; PLACEMNET OF NEW DIALYSIS CATHETER-left IJ-27 cm with fluoroscopic guidance  Anesthesia: Mac  EBL: Minimal  Complications: None  Procedure Details: The patient was taken to the operating room placed in the Trendelenburg position. The left IJ tunneled dialysis catheter was then examined. The arterial port flushed easily the venous port was unpredictable. Patient's head turned to the right side it did aspirate better. After prepping and draping in routine sterile manner it appeared that maybe the catheter was then slightly too far therefore decided to remove this catheter inserted and a catheter slightly more proximally. After infiltration with 1% Xylocaine with epinephrine the old incision in the supraclavicular area was reopened the catheter identified grasped with a hemostat. It was transected distally and the old catheter removed from the exit site. Guidewire passed through the old catheter into the right atrium catheter removed over the wire and using a peel-away sheath and a 27 cm catheter was positioned slightly more proximally. It was then tunneled in a Morse superior area after infiltration with Xylocaine. Both ports easily flushed with heparin saline and all head and neck positions. New catheter was then secured with nylon suture and the old exit site was closed with a simple nylon suture. The supraclavicular wound was reclosed with Vicryl and 4-0 nylon. Once again the ports were checked in the easily flushed with heparin saline. Sterile dressing applied patient taken recovery for chest x-ray  Shane GipJames Braeden Dolinski, MD 08/01/2015 10:56 AM

## 2015-08-01 NOTE — Transfer of Care (Signed)
Immediate Anesthesia Transfer of Care Note  Patient: Shane MinaReginald D Flesch  Procedure(s) Performed: Procedure(s): REMOVAL OF DIALYSIS CATHETER; PLACEMNET OF NEW DIALYSIS CATHETER (Left)  Patient Location: PACU  Anesthesia Type:MAC  Level of Consciousness: awake, alert  and oriented  Airway & Oxygen Therapy: Patient Spontanous Breathing and Patient connected to nasal cannula oxygen  Post-op Assessment: Report given to RN and Post -op Vital signs reviewed and stable  Post vital signs: Reviewed and stable  Last Vitals:  Filed Vitals:   08/01/15 0818  BP: 151/90  Pulse: 104  Temp: 36.8 C  Resp: 22    Complications: No apparent anesthesia complications

## 2015-08-01 NOTE — Progress Notes (Signed)
Pt reports that BP has to be taken in the right, lower leg, around the ankle area.

## 2015-08-01 NOTE — Anesthesia Postprocedure Evaluation (Signed)
  Anesthesia Post-op Note  Patient: Shane Hamilton  Procedure(s) Performed: Procedure(s): REMOVAL OF DIALYSIS CATHETER; PLACEMNET OF NEW DIALYSIS CATHETER (Left)  Patient Location: PACU  Anesthesia Type:MAC  Level of Consciousness: awake  Airway and Oxygen Therapy: Patient Spontanous Breathing  Post-op Pain: none  Post-op Assessment: Post-op Vital signs reviewed, Patient's Cardiovascular Status Stable, Respiratory Function Stable, Patent Airway, No signs of Nausea or vomiting and Pain level controlled              Post-op Vital Signs: Reviewed and stable  Last Vitals:  Filed Vitals:   08/01/15 1143  BP: 133/85  Pulse: 95  Temp: 36.6 C  Resp: 14    Complications: No apparent anesthesia complications

## 2015-08-01 NOTE — Interval H&P Note (Signed)
History and Physical Interval Note:  08/01/2015 9:39 AM  Shane Hamilton  has presented today for surgery, with the diagnosis of complication of dialysis catheter, end stage renal disease  The various methods of treatment have been discussed with the patient and family. After consideration of risks, benefits and other options for treatment, the patient has consented to  Procedure(s): EXCHANGE OF DIALYSIS CATHETER (N/A) as a surgical intervention .  The patient's history has been reviewed, patient examined, no change in status, stable for surgery.  I have reviewed the patient's chart and labs.  Questions were answered to the patient's satisfaction.     Josephina GipLawson, James

## 2015-08-01 NOTE — H&P (Signed)
  Patient with end-stage renal disease had left internal jugular cuffed hemodialysis catheter placed last week by me Apparently the catheter is positional and only works well with the patient's head turned to the right side This may be mechanical because of patient's large size-405 pounds Was unable to access the right IJ  Will reposition the left IJ catheter and/or exchange for a new catheter to see if this solves the problem

## 2015-08-01 NOTE — Progress Notes (Signed)
IV attempted in both hands, unable to obtain. Tabatha, charge CRNA called and will also attempt. Dr. Hart RochesterLawson in to see pt, okay to attempt IV in either hand or arm.

## 2015-08-02 ENCOUNTER — Encounter (HOSPITAL_COMMUNITY): Payer: Self-pay | Admitting: Vascular Surgery

## 2015-08-02 DIAGNOSIS — N186 End stage renal disease: Secondary | ICD-10-CM | POA: Diagnosis not present

## 2015-08-02 DIAGNOSIS — G4733 Obstructive sleep apnea (adult) (pediatric): Secondary | ICD-10-CM | POA: Diagnosis not present

## 2015-08-02 DIAGNOSIS — T827XXD Infection and inflammatory reaction due to other cardiac and vascular devices, implants and grafts, subsequent encounter: Secondary | ICD-10-CM | POA: Diagnosis not present

## 2015-08-02 DIAGNOSIS — I120 Hypertensive chronic kidney disease with stage 5 chronic kidney disease or end stage renal disease: Secondary | ICD-10-CM | POA: Diagnosis not present

## 2015-08-02 DIAGNOSIS — E1165 Type 2 diabetes mellitus with hyperglycemia: Secondary | ICD-10-CM | POA: Diagnosis not present

## 2015-08-03 DIAGNOSIS — N2581 Secondary hyperparathyroidism of renal origin: Secondary | ICD-10-CM | POA: Diagnosis not present

## 2015-08-03 DIAGNOSIS — N186 End stage renal disease: Secondary | ICD-10-CM | POA: Diagnosis not present

## 2015-08-03 DIAGNOSIS — D689 Coagulation defect, unspecified: Secondary | ICD-10-CM | POA: Diagnosis not present

## 2015-08-03 DIAGNOSIS — L0390 Cellulitis, unspecified: Secondary | ICD-10-CM | POA: Diagnosis not present

## 2015-08-03 DIAGNOSIS — D631 Anemia in chronic kidney disease: Secondary | ICD-10-CM | POA: Diagnosis not present

## 2015-08-05 DIAGNOSIS — N2581 Secondary hyperparathyroidism of renal origin: Secondary | ICD-10-CM | POA: Diagnosis not present

## 2015-08-05 DIAGNOSIS — D631 Anemia in chronic kidney disease: Secondary | ICD-10-CM | POA: Diagnosis not present

## 2015-08-05 DIAGNOSIS — Z992 Dependence on renal dialysis: Secondary | ICD-10-CM | POA: Diagnosis not present

## 2015-08-05 DIAGNOSIS — T827XXD Infection and inflammatory reaction due to other cardiac and vascular devices, implants and grafts, subsequent encounter: Secondary | ICD-10-CM | POA: Diagnosis not present

## 2015-08-05 DIAGNOSIS — N186 End stage renal disease: Secondary | ICD-10-CM | POA: Diagnosis not present

## 2015-08-05 DIAGNOSIS — G4733 Obstructive sleep apnea (adult) (pediatric): Secondary | ICD-10-CM | POA: Diagnosis not present

## 2015-08-05 DIAGNOSIS — D689 Coagulation defect, unspecified: Secondary | ICD-10-CM | POA: Diagnosis not present

## 2015-08-05 DIAGNOSIS — I120 Hypertensive chronic kidney disease with stage 5 chronic kidney disease or end stage renal disease: Secondary | ICD-10-CM | POA: Diagnosis not present

## 2015-08-05 DIAGNOSIS — E1165 Type 2 diabetes mellitus with hyperglycemia: Secondary | ICD-10-CM | POA: Diagnosis not present

## 2015-08-05 DIAGNOSIS — L0390 Cellulitis, unspecified: Secondary | ICD-10-CM | POA: Diagnosis not present

## 2015-08-05 DIAGNOSIS — E1129 Type 2 diabetes mellitus with other diabetic kidney complication: Secondary | ICD-10-CM | POA: Diagnosis not present

## 2015-08-06 DIAGNOSIS — D509 Iron deficiency anemia, unspecified: Secondary | ICD-10-CM | POA: Diagnosis not present

## 2015-08-06 DIAGNOSIS — N186 End stage renal disease: Secondary | ICD-10-CM | POA: Diagnosis not present

## 2015-08-06 DIAGNOSIS — N2581 Secondary hyperparathyroidism of renal origin: Secondary | ICD-10-CM | POA: Diagnosis not present

## 2015-08-06 DIAGNOSIS — D631 Anemia in chronic kidney disease: Secondary | ICD-10-CM | POA: Diagnosis not present

## 2015-08-06 DIAGNOSIS — D689 Coagulation defect, unspecified: Secondary | ICD-10-CM | POA: Diagnosis not present

## 2015-08-07 DIAGNOSIS — N186 End stage renal disease: Secondary | ICD-10-CM | POA: Diagnosis not present

## 2015-08-07 DIAGNOSIS — E1165 Type 2 diabetes mellitus with hyperglycemia: Secondary | ICD-10-CM | POA: Diagnosis not present

## 2015-08-07 DIAGNOSIS — G4733 Obstructive sleep apnea (adult) (pediatric): Secondary | ICD-10-CM | POA: Diagnosis not present

## 2015-08-07 DIAGNOSIS — T827XXD Infection and inflammatory reaction due to other cardiac and vascular devices, implants and grafts, subsequent encounter: Secondary | ICD-10-CM | POA: Diagnosis not present

## 2015-08-07 DIAGNOSIS — I120 Hypertensive chronic kidney disease with stage 5 chronic kidney disease or end stage renal disease: Secondary | ICD-10-CM | POA: Diagnosis not present

## 2015-08-08 DIAGNOSIS — D689 Coagulation defect, unspecified: Secondary | ICD-10-CM | POA: Diagnosis not present

## 2015-08-08 DIAGNOSIS — D509 Iron deficiency anemia, unspecified: Secondary | ICD-10-CM | POA: Diagnosis not present

## 2015-08-08 DIAGNOSIS — Z86711 Personal history of pulmonary embolism: Secondary | ICD-10-CM | POA: Diagnosis not present

## 2015-08-08 DIAGNOSIS — I2782 Chronic pulmonary embolism: Secondary | ICD-10-CM | POA: Diagnosis not present

## 2015-08-08 DIAGNOSIS — D631 Anemia in chronic kidney disease: Secondary | ICD-10-CM | POA: Diagnosis not present

## 2015-08-08 DIAGNOSIS — N186 End stage renal disease: Secondary | ICD-10-CM | POA: Diagnosis not present

## 2015-08-08 DIAGNOSIS — N2581 Secondary hyperparathyroidism of renal origin: Secondary | ICD-10-CM | POA: Diagnosis not present

## 2015-08-09 DIAGNOSIS — G4733 Obstructive sleep apnea (adult) (pediatric): Secondary | ICD-10-CM | POA: Diagnosis not present

## 2015-08-09 DIAGNOSIS — N186 End stage renal disease: Secondary | ICD-10-CM | POA: Diagnosis not present

## 2015-08-09 DIAGNOSIS — T827XXD Infection and inflammatory reaction due to other cardiac and vascular devices, implants and grafts, subsequent encounter: Secondary | ICD-10-CM | POA: Diagnosis not present

## 2015-08-09 DIAGNOSIS — I120 Hypertensive chronic kidney disease with stage 5 chronic kidney disease or end stage renal disease: Secondary | ICD-10-CM | POA: Diagnosis not present

## 2015-08-09 DIAGNOSIS — E1165 Type 2 diabetes mellitus with hyperglycemia: Secondary | ICD-10-CM | POA: Diagnosis not present

## 2015-08-10 DIAGNOSIS — D689 Coagulation defect, unspecified: Secondary | ICD-10-CM | POA: Diagnosis not present

## 2015-08-10 DIAGNOSIS — N186 End stage renal disease: Secondary | ICD-10-CM | POA: Diagnosis not present

## 2015-08-10 DIAGNOSIS — D631 Anemia in chronic kidney disease: Secondary | ICD-10-CM | POA: Diagnosis not present

## 2015-08-10 DIAGNOSIS — N2581 Secondary hyperparathyroidism of renal origin: Secondary | ICD-10-CM | POA: Diagnosis not present

## 2015-08-10 DIAGNOSIS — D509 Iron deficiency anemia, unspecified: Secondary | ICD-10-CM | POA: Diagnosis not present

## 2015-08-12 DIAGNOSIS — E1165 Type 2 diabetes mellitus with hyperglycemia: Secondary | ICD-10-CM | POA: Diagnosis not present

## 2015-08-12 DIAGNOSIS — D631 Anemia in chronic kidney disease: Secondary | ICD-10-CM | POA: Diagnosis not present

## 2015-08-12 DIAGNOSIS — T827XXD Infection and inflammatory reaction due to other cardiac and vascular devices, implants and grafts, subsequent encounter: Secondary | ICD-10-CM | POA: Diagnosis not present

## 2015-08-12 DIAGNOSIS — D509 Iron deficiency anemia, unspecified: Secondary | ICD-10-CM | POA: Diagnosis not present

## 2015-08-12 DIAGNOSIS — G4733 Obstructive sleep apnea (adult) (pediatric): Secondary | ICD-10-CM | POA: Diagnosis not present

## 2015-08-12 DIAGNOSIS — D689 Coagulation defect, unspecified: Secondary | ICD-10-CM | POA: Diagnosis not present

## 2015-08-12 DIAGNOSIS — N2581 Secondary hyperparathyroidism of renal origin: Secondary | ICD-10-CM | POA: Diagnosis not present

## 2015-08-12 DIAGNOSIS — I120 Hypertensive chronic kidney disease with stage 5 chronic kidney disease or end stage renal disease: Secondary | ICD-10-CM | POA: Diagnosis not present

## 2015-08-12 DIAGNOSIS — N186 End stage renal disease: Secondary | ICD-10-CM | POA: Diagnosis not present

## 2015-08-13 DIAGNOSIS — D631 Anemia in chronic kidney disease: Secondary | ICD-10-CM | POA: Diagnosis not present

## 2015-08-13 DIAGNOSIS — N186 End stage renal disease: Secondary | ICD-10-CM | POA: Diagnosis not present

## 2015-08-13 DIAGNOSIS — D509 Iron deficiency anemia, unspecified: Secondary | ICD-10-CM | POA: Diagnosis not present

## 2015-08-13 DIAGNOSIS — D689 Coagulation defect, unspecified: Secondary | ICD-10-CM | POA: Diagnosis not present

## 2015-08-13 DIAGNOSIS — N2581 Secondary hyperparathyroidism of renal origin: Secondary | ICD-10-CM | POA: Diagnosis not present

## 2015-08-14 DIAGNOSIS — E1165 Type 2 diabetes mellitus with hyperglycemia: Secondary | ICD-10-CM | POA: Diagnosis not present

## 2015-08-14 DIAGNOSIS — I120 Hypertensive chronic kidney disease with stage 5 chronic kidney disease or end stage renal disease: Secondary | ICD-10-CM | POA: Diagnosis not present

## 2015-08-14 DIAGNOSIS — T827XXD Infection and inflammatory reaction due to other cardiac and vascular devices, implants and grafts, subsequent encounter: Secondary | ICD-10-CM | POA: Diagnosis not present

## 2015-08-14 DIAGNOSIS — N186 End stage renal disease: Secondary | ICD-10-CM | POA: Diagnosis not present

## 2015-08-14 DIAGNOSIS — G4733 Obstructive sleep apnea (adult) (pediatric): Secondary | ICD-10-CM | POA: Diagnosis not present

## 2015-08-15 DIAGNOSIS — D689 Coagulation defect, unspecified: Secondary | ICD-10-CM | POA: Diagnosis not present

## 2015-08-15 DIAGNOSIS — N186 End stage renal disease: Secondary | ICD-10-CM | POA: Diagnosis not present

## 2015-08-15 DIAGNOSIS — N2581 Secondary hyperparathyroidism of renal origin: Secondary | ICD-10-CM | POA: Diagnosis not present

## 2015-08-15 DIAGNOSIS — Z86711 Personal history of pulmonary embolism: Secondary | ICD-10-CM | POA: Diagnosis not present

## 2015-08-15 DIAGNOSIS — D631 Anemia in chronic kidney disease: Secondary | ICD-10-CM | POA: Diagnosis not present

## 2015-08-15 DIAGNOSIS — D509 Iron deficiency anemia, unspecified: Secondary | ICD-10-CM | POA: Diagnosis not present

## 2015-08-15 DIAGNOSIS — I2782 Chronic pulmonary embolism: Secondary | ICD-10-CM | POA: Diagnosis not present

## 2015-08-16 DIAGNOSIS — G4733 Obstructive sleep apnea (adult) (pediatric): Secondary | ICD-10-CM | POA: Diagnosis not present

## 2015-08-16 DIAGNOSIS — E1165 Type 2 diabetes mellitus with hyperglycemia: Secondary | ICD-10-CM | POA: Diagnosis not present

## 2015-08-16 DIAGNOSIS — I120 Hypertensive chronic kidney disease with stage 5 chronic kidney disease or end stage renal disease: Secondary | ICD-10-CM | POA: Diagnosis not present

## 2015-08-16 DIAGNOSIS — N186 End stage renal disease: Secondary | ICD-10-CM | POA: Diagnosis not present

## 2015-08-16 DIAGNOSIS — T827XXD Infection and inflammatory reaction due to other cardiac and vascular devices, implants and grafts, subsequent encounter: Secondary | ICD-10-CM | POA: Diagnosis not present

## 2015-08-17 DIAGNOSIS — D689 Coagulation defect, unspecified: Secondary | ICD-10-CM | POA: Diagnosis not present

## 2015-08-17 DIAGNOSIS — N186 End stage renal disease: Secondary | ICD-10-CM | POA: Diagnosis not present

## 2015-08-17 DIAGNOSIS — D509 Iron deficiency anemia, unspecified: Secondary | ICD-10-CM | POA: Diagnosis not present

## 2015-08-17 DIAGNOSIS — Z7901 Long term (current) use of anticoagulants: Secondary | ICD-10-CM | POA: Diagnosis not present

## 2015-08-17 DIAGNOSIS — I2782 Chronic pulmonary embolism: Secondary | ICD-10-CM | POA: Diagnosis not present

## 2015-08-17 DIAGNOSIS — D631 Anemia in chronic kidney disease: Secondary | ICD-10-CM | POA: Diagnosis not present

## 2015-08-17 DIAGNOSIS — N2581 Secondary hyperparathyroidism of renal origin: Secondary | ICD-10-CM | POA: Diagnosis not present

## 2015-08-17 DIAGNOSIS — Z5181 Encounter for therapeutic drug level monitoring: Secondary | ICD-10-CM | POA: Diagnosis not present

## 2015-08-19 DIAGNOSIS — N186 End stage renal disease: Secondary | ICD-10-CM | POA: Diagnosis not present

## 2015-08-19 DIAGNOSIS — I120 Hypertensive chronic kidney disease with stage 5 chronic kidney disease or end stage renal disease: Secondary | ICD-10-CM | POA: Diagnosis not present

## 2015-08-19 DIAGNOSIS — G4733 Obstructive sleep apnea (adult) (pediatric): Secondary | ICD-10-CM | POA: Diagnosis not present

## 2015-08-19 DIAGNOSIS — D631 Anemia in chronic kidney disease: Secondary | ICD-10-CM | POA: Diagnosis not present

## 2015-08-19 DIAGNOSIS — N2581 Secondary hyperparathyroidism of renal origin: Secondary | ICD-10-CM | POA: Diagnosis not present

## 2015-08-19 DIAGNOSIS — D509 Iron deficiency anemia, unspecified: Secondary | ICD-10-CM | POA: Diagnosis not present

## 2015-08-19 DIAGNOSIS — D689 Coagulation defect, unspecified: Secondary | ICD-10-CM | POA: Diagnosis not present

## 2015-08-19 DIAGNOSIS — T827XXD Infection and inflammatory reaction due to other cardiac and vascular devices, implants and grafts, subsequent encounter: Secondary | ICD-10-CM | POA: Diagnosis not present

## 2015-08-19 DIAGNOSIS — E1165 Type 2 diabetes mellitus with hyperglycemia: Secondary | ICD-10-CM | POA: Diagnosis not present

## 2015-08-20 DIAGNOSIS — D689 Coagulation defect, unspecified: Secondary | ICD-10-CM | POA: Diagnosis not present

## 2015-08-20 DIAGNOSIS — D509 Iron deficiency anemia, unspecified: Secondary | ICD-10-CM | POA: Diagnosis not present

## 2015-08-20 DIAGNOSIS — D631 Anemia in chronic kidney disease: Secondary | ICD-10-CM | POA: Diagnosis not present

## 2015-08-20 DIAGNOSIS — N2581 Secondary hyperparathyroidism of renal origin: Secondary | ICD-10-CM | POA: Diagnosis not present

## 2015-08-20 DIAGNOSIS — N186 End stage renal disease: Secondary | ICD-10-CM | POA: Diagnosis not present

## 2015-08-21 ENCOUNTER — Encounter: Payer: Medicare Other | Attending: Surgery | Admitting: Surgery

## 2015-08-21 ENCOUNTER — Other Ambulatory Visit: Payer: Self-pay | Admitting: *Deleted

## 2015-08-21 DIAGNOSIS — E669 Obesity, unspecified: Secondary | ICD-10-CM | POA: Insufficient documentation

## 2015-08-21 DIAGNOSIS — N186 End stage renal disease: Secondary | ICD-10-CM

## 2015-08-21 DIAGNOSIS — Z01810 Encounter for preprocedural cardiovascular examination: Secondary | ICD-10-CM

## 2015-08-21 DIAGNOSIS — G4730 Sleep apnea, unspecified: Secondary | ICD-10-CM | POA: Diagnosis not present

## 2015-08-21 DIAGNOSIS — X58XXXA Exposure to other specified factors, initial encounter: Secondary | ICD-10-CM | POA: Diagnosis not present

## 2015-08-21 DIAGNOSIS — Z992 Dependence on renal dialysis: Secondary | ICD-10-CM | POA: Insufficient documentation

## 2015-08-21 DIAGNOSIS — E11622 Type 2 diabetes mellitus with other skin ulcer: Secondary | ICD-10-CM | POA: Insufficient documentation

## 2015-08-21 DIAGNOSIS — I120 Hypertensive chronic kidney disease with stage 5 chronic kidney disease or end stage renal disease: Secondary | ICD-10-CM | POA: Diagnosis not present

## 2015-08-21 DIAGNOSIS — S41102A Unspecified open wound of left upper arm, initial encounter: Secondary | ICD-10-CM | POA: Insufficient documentation

## 2015-08-21 DIAGNOSIS — E1140 Type 2 diabetes mellitus with diabetic neuropathy, unspecified: Secondary | ICD-10-CM | POA: Insufficient documentation

## 2015-08-21 NOTE — Progress Notes (Signed)
TOLBERT, MATHESON ([id number]) Visit Report for 08/21/2015 Abuse/Suicide Risk Screen Details Patient Name: Shane Hamilton, Shane Hamilton. Date of Service: 08/21/2015 8:45 AM Medical Record Number: [id number] Patient Account Number: [id number] Date of Birth/Sex: January 26, 1966 (49 y.o. Male) Treating RN: Clover Mealy, RN, BSN, [place] Sink Primary Care Physician: Darrow Bussing Other Clinician: Referring Physician: Treating Physician/Extender: BURNS III, WALTER Weeks in Treatment: 0 Abuse/Suicide Risk Screen Items Answer ABUSE/SUICIDE RISK SCREEN: Has anyone close to you tried to hurt or harm you recentlyo No Do you feel uncomfortable with anyone in your familyo No Has anyone forced you do things that you didnot want to doo No Do you have any thoughts of harming yourselfo No Patient displays signs or symptoms of abuse and/or neglect. No Electronic Signature(s) Signed: 08/21/2015 8:50:26 AM By: Elpidio Eric BSN, RN Entered By: Elpidio Eric on 08/21/2015 08:50:26 Shane Hamilton ([id number]) -------------------------------------------------------------------------------- Activities of Daily Living Details Patient Name: Shane Hamilton, Shane Hamilton. Date of Service: 08/21/2015 8:45 AM Medical Record Number: [id number] Patient Account Number: [id number] Date of Birth/Sex: 1966/04/29 (49 y.o. Male) Treating RN: Clover Mealy, RN, BSN, [place] Sink Primary Care Physician: Darrow Bussing Other Clinician: Referring Physician: Treating Physician/Extender: BURNS III, WALTER Weeks in Treatment: 0 Activities of Daily Living Items Answer Activities of Daily Living (Please select one for each item) Drive Automobile Not Able Take Medications Need Assistance Use Telephone Completely Able Care for Appearance Completely Able Use Toilet Completely Able Bath / Shower Completely Able Dress Self Completely Able Feed Self Completely Able Walk Completely Able Get In / Out Bed Completely Able Housework Completely Able Prepare Meals Completely  Able Handle Money Completely Able Shop for Self Need Assistance Electronic Signature(s) Signed: 08/21/2015 8:50:14 AM By: Elpidio Eric BSN, RN Entered By: Elpidio Eric on 08/21/2015 08:50:14 Shane Hamilton ([id number]) -------------------------------------------------------------------------------- Education Assessment Details Patient Name: Shane Dubin D. Date of Service: 08/21/2015 8:45 AM Medical Record Number: [id number] Patient Account Number: [id number] Date of Birth/Sex: December 26, 1965 (49 y.o. Male) Treating RN: Clover Mealy, RN, BSN, [place]ink Primary Care Physician: Darrow Bussing Other Clinician: Referring Physician: Treating Physician/Extender: BURNS III, Regis Bill in Treatment: 0 Primary Learner Assessed: Patient Learning Preferences/Education Level/Primary Language Learning Preference: Explanation Highest Education Level: College or Above Preferred Language: English Cognitive Barrier Assessment/Beliefs Language Barrier: No Physical Barrier Assessment Impaired Vision: Yes Glasses Impaired Hearing: No Decreased Hand dexterity: No Knowledge/Comprehension Assessment Knowledge Level: High Comprehension Level: High Ability to understand written High instructions: Ability to understand verbal High instructions: Motivation Assessment Anxiety Level: Calm Cooperation: Cooperative Education Importance: Acknowledges Need Interest in Health Problems: Asks Questions Perception: Coherent Willingness to Engage in Self- High Management Activities: Readiness to Engage in Self- High Management Activities: Electronic Signature(s) Signed: 08/21/2015 8:49:39 AM By: Elpidio Eric BSN, RN Entered By: Elpidio Eric on 08/21/2015 08:49:39 Shane Hamilton, Shane Hamilton ([id number]) -------------------------------------------------------------------------------- Fall Risk Assessment Details Patient Name: Shane Hamilton. Date of Service: 08/21/2015 8:45 AM Medical Record Number:  [id number] Patient Account Number: [id number] Date of Birth/Sex: 24-Nov-1965 (49 y.o. Male) Treating RN: Clover Mealy, RN, BSN, Rita Primary Care Physician: Darrow Bussing Other Clinician: Referring Physician: Treating Physician/Extender: BURNS III, Regis Bill in Treatment: 0 Fall Risk Assessment Items FALL RISK ASSESSMENT: History of falling - immediate or within 3 months 25 Yes Secondary diagnosis 0 No Ambulatory aid None/bed rest/wheelchair/nurse 0 Yes Crutches/cane/walker 0 No Furniture 0 No IV Access/Saline Lock 0 No Gait/Training Normal/bed rest/immobile 0 Yes Weak 0 No Impaired 0 No Mental Status Oriented to own ability 0 Yes Electronic Signature(s) Signed: 08/21/2015 8:49:17 AM By: Elpidio Eric BSN, RN Entered By:  Elpidio Ericfful, Rita on 08/21/2015 08:49:16 Shane Hamilton, Shane D. ([id number]) -------------------------------------------------------------------------------- Foot Assessment Details Patient Name: Shane Hamilton, Shane D. Date of Service: 08/21/2015 8:45 AM Medical Record Number: [id number] Patient Account Number: [id number] Date of Birth/Sex: 04/22/1966 8(49 y.o. Male) Treating RN: Clover MealyAfful, RN, BSN, [place] Primary Care Physician: Darrow BussingKOIRALA, DIBAS Other Clinician: Referring Physician: Treating Physician/Extender: BURNS III, WALTER Weeks in Treatment: 0 Foot Assessment Items Site Locations + = Sensation present, - = Sensation absent, C = Callus, U = Ulcer R = Redness, W = Warmth, M = Maceration, PU = Pre-ulcerative lesion F = Fissure, S = Swelling, D = Dryness Assessment Right: Left: Other Deformity: No No Prior Foot Ulcer: No No Prior Amputation: No No Charcot Joint: No No Ambulatory Status: Ambulatory Without Help Gait: Steady Electronic Signature(s) Signed: 08/21/2015 8:48:52 AM By: Elpidio EricAfful, Rita BSN, RN Entered By: Elpidio EricAfful, Rita on 08/21/2015 08:48:52 Shane Hamilton, Shane D. ([id number]) -------------------------------------------------------------------------------- Nutrition Risk  Assessment Details Patient Name: Shane DubinWILSON, Shane D. Date of Service: 08/21/2015 8:45 AM Medical Record Number: [id number] Patient Account Number: [id number] Date of Birth/Sex: 01/25/1966 (49 y.o. Male) Treating RN: Clover MealyAfful, RN, BSN, Rita Primary Care Physician: Darrow BussingKOIRALA, DIBAS Other Clinician: Referring Physician: Treating Physician/Extender: BURNS III, WALTER Weeks in Treatment: 0 Height (in): 71 Weight (lbs): 426 Body Mass Index (BMI): 59.4 Nutrition Risk Assessment Items NUTRITION RISK SCREEN: I have an illness or condition that made me change the kind and/or 0 No amount of food I eat I eat fewer than two meals per day 0 No I eat few fruits and vegetables, or milk products 0 No I have three or more drinks of beer, liquor or wine almost every day 0 No I have tooth or mouth problems that make it hard for me to eat 0 No I don't always have enough money to buy the food I need 0 No I eat alone most of the time 1 Yes I take three or more different prescribed or over-the-counter drugs a 0 No day Without wanting to, I have lost or gained 10 pounds in the last six 2 Yes months I am not always physically able to shop, cook and/or feed myself 0 No Nutrition Protocols Good Risk Protocol Provide education on Moderate Risk Protocol 0 nutrition Electronic Signature(s) Signed: 08/21/2015 8:49:04 AM By: Elpidio EricAfful, Rita BSN, RN Entered By: Elpidio EricAfful, Rita on 08/21/2015 08:49:04

## 2015-08-21 NOTE — Progress Notes (Addendum)
REES, SANTISTEVAN ([id number]) Visit Report for 08/21/2015 Allergy List Details Patient Name: Shane Hamilton, Shane Hamilton. Date of Service: 08/21/2015 8:45 AM Medical Record Number: [id number] Patient Account Number: [id number] Date of Birth/Sex: May 25, 1966 (49 y.o. Male) Treating RN: Clover Mealy, RN, BSN, [place] Sink Primary Care Physician: Darrow Bussing Other Clinician: Referring Physician: Treating Physician/Extender: BURNS III, WALTER Weeks in Treatment: 0 Allergies Active Allergies aspirin Allergy Notes Electronic Signature(s) Signed: 08/21/2015 8:48:39 AM By: Elpidio Eric BSN, RN Entered By: Elpidio Eric on 08/21/2015 08:48:39 Shane Hamilton ([id number]) -------------------------------------------------------------------------------- Arrival Information Details Patient Name: Shane Hamilton D. Date of Service: 08/21/2015 8:45 AM Medical Record Number: [id number] Patient Account Number: [id number] Date of Birth/Sex: [birth year] (49 y.o. Male) Treating RN: Clover Mealy, RN, BSN, [place] Sink Primary Care Physician: Darrow Bussing Other Clinician: Referring Physician: Treating Physician/Extender: BURNS III, Regis Bill in Treatment: 0 Visit Information Patient Arrived: Ambulatory Arrival Time: 08:44 Accompanied By: self Transfer Assistance: None Patient Identification Verified: Yes Secondary Verification Process Yes Completed: Patient Requires Transmission- No Based Precautions: Patient Has Alerts: Yes Patient Alerts: Patient on Blood Thinner warfarin Electronic Signature(s) Signed: 08/21/2015 8:44:45 AM By: Elpidio Eric BSN, RN Entered By: Elpidio Eric on 08/21/2015 08:44:45 Shane Hamilton ([id number]) -------------------------------------------------------------------------------- Clinic Level of Care Assessment Details Patient Name: Shane Hamilton D. Date of Service: 08/21/2015 8:45 AM Medical Record Number: [id number] Patient Account Number: [id number] Date of Birth/Sex: 09-Apr-1966 (49 y.o.  Male) Treating RN: Clover Mealy, RN, BSN, [place] Sink Primary Care Physician: Darrow Bussing Other Clinician: Referring Physician: Treating Physician/Extender: BURNS III, WALTER Weeks in Treatment: 0 Clinic Level of Care Assessment Items TOOL 2 Quantity Score []  - Use when only an EandM is performed on the INITIAL visit 0 ASSESSMENTS - Nursing Assessment / Reassessment X - General Physical Exam (combine w/ comprehensive assessment (listed just 1 20 below) when performed on new pt. evals) X - Comprehensive Assessment (HX, ROS, Risk Assessments, Wounds Hx, etc.) 1 25 ASSESSMENTS - Wound and Skin Assessment / Reassessment X - Simple Wound Assessment / Reassessment - one wound 1 5 []  - Complex Wound Assessment / Reassessment - multiple wounds 0 []  - Dermatologic / Skin Assessment (not related to wound area) 0 ASSESSMENTS - Ostomy and/or Continence Assessment and Care []  - Incontinence Assessment and Management 0 []  - Ostomy Care Assessment and Management (repouching, etc.) 0 PROCESS - Coordination of Care X - Simple Patient / Family Education for ongoing care 1 15 []  - Complex (extensive) Patient / Family Education for ongoing care 0 []  - Staff obtains Chiropractor, Records, Test Results / Process Orders 0 []  - Staff telephones HHA, Nursing Homes / Clarify orders / etc 0 []  - Routine Transfer to another Facility (non-emergent condition) 0 []  - Routine Hospital Admission (non-emergent condition) 0 X - New Admissions / Manufacturing engineer / Ordering NPWT, Apligraf, etc. 1 15 []  - Emergency Hospital Admission (emergent condition) 0 []  - Simple Discharge Coordination 0 LONEY, DOMINGO. ([id number]) []  - Complex (extensive) Discharge Coordination 0 PROCESS - Special Needs []  - Pediatric / Minor Patient Management 0 []  - Isolation Patient Management 0 []  - Hearing / Language / Visual special needs 0 []  - Assessment of Community assistance (transportation, D/C planning, etc.) 0 []  - Additional  assistance / Altered mentation 0 []  - Support Surface(s) Assessment (bed, cushion, seat, etc.) 0 INTERVENTIONS - Wound Cleansing / Measurement X - Wound Imaging (photographs - any number of wounds) 1 5 []  - Wound Tracing (instead of photographs) 0 X - Simple Wound Measurement - one wound 1 5 []  -  Complex Wound Measurement - multiple wounds 0 []  - Simple Wound Cleansing - one wound 0 []  - Complex Wound Cleansing - multiple wounds 0 INTERVENTIONS - Wound Dressings X - Small Wound Dressing one or multiple wounds 1 10 []  - Medium Wound Dressing one or multiple wounds 0 []  - Large Wound Dressing one or multiple wounds 0 []  - Application of Medications - injection 0 INTERVENTIONS - Miscellaneous []  - External ear exam 0 []  - Specimen Collection (cultures, biopsies, blood, body fluids, etc.) 0 []  - Specimen(s) / Culture(s) sent or taken to Lab for analysis 0 []  - Patient Transfer (multiple staff / Nurse, adultHoyer Lift / Similar devices) 0 []  - Simple Staple / Suture removal (25 or less) 0 []  - Complex Staple / Suture removal (26 or more) 0 Shane Hamilton, Shane D. ([id number]) []  - Hypo / Hyperglycemic Management (close monitor of Blood Glucose) 0 []  - Ankle / Brachial Index (ABI) - do not check if billed separately 0 Has the patient been seen at the hospital within the last three years: Yes Total Score: 100 Level Of Care: New/Established - Level 3 Electronic Signature(s) Signed: 08/21/2015 9:27:14 AM By: Elpidio EricAfful, Rita BSN, RN Entered By: Elpidio EricAfful, Rita on 08/21/2015 09:27:13 Shane MinaWILSON, Shane D. ([id number]) -------------------------------------------------------------------------------- Encounter Discharge Information Details Patient Name: Shane Hamilton, Shane D. Date of Service: 08/21/2015 8:45 AM Medical Record Number: [id number] Patient Account Number: [id number] Date of Birth/Sex: 05/08/1966 94(49 y.o. Male) Treating RN: Clover MealyAfful, RN, BSN, [place] Sinkita Primary Care Physician: Darrow BussingKOIRALA, DIBAS Other Clinician: Referring  Physician: Treating Physician/Extender: BURNS III, Regis BillWALTER Weeks in Treatment: 0 Encounter Discharge Information Items Discharge Pain Level: 0 Discharge Condition: Stable Ambulatory Status: Ambulatory Discharge Destination: Home Transportation: Private Auto Accompanied By: spouse in lobby Schedule Follow-up Appointment: No Medication Reconciliation completed and provided to Patient/Care No Eliav Mechling: Provided on Clinical Summary of Care: 08/21/2015 Form Type Recipient Paper Patient RW Electronic Signature(s) Signed: 08/21/2015 9:31:51 AM By: Elpidio EricAfful, Rita BSN, RN Previous Signature: 08/21/2015 9:22:51 AM Version By: Gwenlyn PerkingMoore, Shelia Entered By: Elpidio EricAfful, Rita on 08/21/2015 09:31:51 Shane Hamilton, Shane D. ([id number]) -------------------------------------------------------------------------------- Lower Extremity Assessment Details Patient Name: Shane Hamilton, Shane D. Date of Service: 08/21/2015 8:45 AM Medical Record Number: [id number] Patient Account Number: [id number] Date of Birth/Sex: 11/03/1965 [age] y.o. Male) Treating RN: Clover MealyAfful, RN, BSN, [place] Sinkita Primary Care Physician: Darrow BussingKOIRALA, DIBAS Other Clinician: Referring Physician: Treating Physician/Extender: BURNS III, Regis BillWALTER Weeks in Treatment: 0 Electronic Signature(s) Signed: 08/21/2015 8:50:53 AM By: Elpidio EricAfful, Rita BSN, RN Entered By: Elpidio EricAfful, Rita on 08/21/2015 08:50:53 Shane Hamilton, Konnor D. ([id number]) -------------------------------------------------------------------------------- Multi Wound Chart Details Patient Name: Shane Hamilton, Riddick D. Date of Service: 08/21/2015 8:45 AM Medical Record Number: [id number] Patient Account Number: [id number] Date of Birth/Sex: 01/30/1966 (49 y.o. Male) Treating RN: Clover MealyAfful, RN, BSN, [place] Sinkita Primary Care Physician: Darrow BussingKOIRALA, DIBAS Other Clinician: Referring Physician: Treating Physician/Extender: BURNS III, WALTER Weeks in Treatment: 0 Vital Signs Height(in): 71 Pulse(bpm): 99 Weight(lbs): 426 Blood  Pressure 116/62 (mmHg): Body Mass Index(BMI): 59 Temperature(F): 98.3 Respiratory Rate 20 (breaths/min): Photos: [1:No Photos] [N/A:N/A] Wound Location: [1:Left Upper Arm - Anterior] [N/A:N/A] Wounding Event: [1:Surgical Injury] [N/A:N/A] Primary Etiology: [1:Open Surgical Wound] [N/A:N/A] Comorbid History: [1:Anemia, Sleep Apnea, Hypertension, Type II Diabetes, End Stage Renal Disease, Neuropathy] [N/A:N/A] Date Acquired: [1:07/20/2015] [N/A:N/A] Weeks of Treatment: [1:0] [N/A:N/A] Wound Status: [1:Open] [N/A:N/A] Measurements L x W x D 1x3x0.1 [N/A:N/A] (cm) Area (cm) : [1:2.356] [N/A:N/A] Volume (cm) : [1:0.236] [N/A:N/A] Classification: [1:Full Thickness Without Exposed Support Structures] [N/A:N/A] Exudate Amount: [1:Medium] [N/A:N/A] Exudate Type: [1:Sanguinous] [N/A:N/A] Exudate Color: [1:red] [N/A:N/A] Wound Margin: [1:Distinct, outline  attached] [N/A:N/A] Granulation Amount: [1:Large (67-100%)] [N/A:N/A] Necrotic Amount: [1:None Present (0%)] [N/A:N/A] Exposed Structures: [1:Fascia: No Fat: No Tendon: No Muscle: No Joint: No] [N/A:N/A] Bone: No Limited to Skin Breakdown Epithelialization: Medium (34-66%) N/A N/A Periwound Skin Texture: Edema: No N/A N/A Excoriation: No Induration: No Callus: No Crepitus: No Fluctuance: No Friable: No Rash: No Scarring: No Periwound Skin Moist: Yes N/A N/A Moisture: Maceration: No Dry/Scaly: No Periwound Skin Color: Atrophie Blanche: No N/A N/A Cyanosis: No Ecchymosis: No Erythema: No Hemosiderin Staining: No Mottled: No Pallor: No Rubor: No Tenderness on No N/A N/A Palpation: Wound Preparation: Ulcer Cleansing: N/A N/A Rinsed/Irrigated with Saline Topical Anesthetic Applied: None Treatment Notes Electronic Signature(s) Signed: 08/21/2015 9:13:19 AM By: Elpidio Eric BSN, RN Entered By: Elpidio Eric on 08/21/2015 09:13:19 Shane Hamilton  ([id number]) -------------------------------------------------------------------------------- Multi-Disciplinary Care Plan Details Patient Name: Shane Hamilton D. Date of Service: 08/21/2015 8:45 AM Medical Record Number: [id number] Patient Account Number: [id number] Date of Birth/Sex: 1965/12/23 (49 y.o. Male) Treating RN: Clover Mealy, RN, BSN, [place] Sink Primary Care Physician: Darrow Bussing Other Clinician: Referring Physician: Treating Physician/Extender: BURNS III, Regis Bill in Treatment: 0 Active Inactive Orientation to the Wound Care Program Nursing Diagnoses: Knowledge deficit related to the wound healing center program Goals: Patient/caregiver will verbalize understanding of the Wound Healing Center Program Date Initiated: 08/21/2015 Goal Status: Active Interventions: Provide education on orientation to the wound center Notes: Wound/Skin Impairment Nursing Diagnoses: Impaired tissue integrity Knowledge deficit related to ulceration/compromised skin integrity Goals: Patient/caregiver will verbalize understanding of skin care regimen Date Initiated: 08/21/2015 Goal Status: Active Ulcer/skin breakdown will have a volume reduction of 30% by week 4 Date Initiated: 08/21/2015 Goal Status: Active Ulcer/skin breakdown will have a volume reduction of 50% by week 8 Date Initiated: 08/21/2015 Goal Status: Active Ulcer/skin breakdown will have a volume reduction of 80% by week 12 Date Initiated: 08/21/2015 Goal Status: Active Ulcer/skin breakdown will heal within 14 weeks Date Initiated: 08/21/2015 NAYIB, REMER ([id number]) Goal Status: Active Interventions: Assess patient/caregiver ability to obtain necessary supplies Assess patient/caregiver ability to perform ulcer/skin care regimen upon admission and as needed Assess ulceration(s) every visit Provide education on ulcer and skin care Notes: Electronic Signature(s) Signed: 08/21/2015 9:12:57 AM By: Elpidio Eric BSN,  RN Entered By: Elpidio Eric on 08/21/2015 09:12:57 Shane Hamilton D. ([id number]) -------------------------------------------------------------------------------- Pain Assessment Details Patient Name: Shane Hamilton D. Date of Service: 08/21/2015 8:45 AM Medical Record Number: [id number] Patient Account Number: [id number] Date of Birth/Sex: 10-16-1965 (49 y.o. Male) Treating RN: Clover Mealy, RN, BSN, [place] Sink Primary Care Physician: Darrow Bussing Other Clinician: Referring Physician: Treating Physician/Extender: BURNS III, WALTER Weeks in Treatment: 0 Active Problems Location of Pain Severity and Description of Pain Patient Has Paino No Site Locations Pain Management and Medication Current Pain Management: Electronic Signature(s) Signed: 08/21/2015 8:45:09 AM By: Elpidio Eric BSN, RN Entered By: Elpidio Eric on 08/21/2015 08:45:08 Shane Hamilton ([id number]) -------------------------------------------------------------------------------- Patient/Caregiver Education Details Patient Name: Shane Hamilton. Date of Service: 08/21/2015 8:45 AM Medical Record Number: [id number] Patient Account Number: [id number] Date of Birth/Gender: September 09, 1966 (49 y.o. Male) Treating RN: Clover Mealy, RN, BSN, [place] Sink Primary Care Physician: Darrow Bussing Other Clinician: Referring Physician: Treating Physician/Extender: BURNS III, Regis Bill in Treatment: 0 Education Assessment Education Provided To: Patient Education Topics Provided Welcome To The Wound Care Center: Methods: Explain/Verbal Responses: State content correctly Wound/Skin Impairment: Methods: Explain/Verbal Responses: State content correctly Electronic Signature(s) Signed: 08/21/2015 9:32:06 AM By: Elpidio Eric BSN, RN Entered By: Elpidio Eric on 08/21/2015 09:32:06 Shane Hamilton D. ([id number]) --------------------------------------------------------------------------------  Wound Assessment Details Patient Name: ROBYN, NOHR. Date of Service: 08/21/2015 8:45 AM Medical Record Number: [id number] Patient Account Number: [id number] Date of Birth/Sex: 1966/03/23 (49 y.o. Male) Treating RN: Clover Mealy, RN, BSN, [place] Sink Primary Care Physician: Darrow Bussing Other Clinician: Referring Physician: Treating Physician/Extender: BURNS III, WALTER Weeks in Treatment: 0 Wound Status Wound Number: 1 Primary Open Surgical Wound Etiology: Wound Location: Left Upper Arm - Anterior Wound Open Wounding Event: Surgical Injury Status: Date Acquired: 07/20/2015 Comorbid Anemia, Sleep Apnea, Hypertension, Weeks Of Treatment: 0 History: Type II Diabetes, End Stage Renal Clustered Wound: No Disease, Neuropathy Photos Photo Uploaded By: Elpidio Eric on 08/21/2015 17:04:57 Wound Measurements Length: (cm) 1 Width: (cm) 3 Depth: (cm) 0.1 Area: (cm) 2.356 Volume: (cm) 0.236 % Reduction in Area: % Reduction in Volume: Epithelialization: Medium (34-66%) Tunneling: No Undermining: No Wound Description Full Thickness Without Exposed Classification: Support Structures Wound Margin: Distinct, outline attached Exudate Medium Amount: Exudate Type: Sanguinous Exudate Color: red Foul Odor After Cleansing: No Wound Bed Granulation Amount: Large (67-100%) Exposed Structure Necrotic Amount: None Present (0%) Fascia Exposed: No LADISLAV, CASELLI D. ([id number]) Fat Layer Exposed: No Tendon Exposed: No Muscle Exposed: No Joint Exposed: No Bone Exposed: No Limited to Skin Breakdown Periwound Skin Texture Texture Color No Abnormalities Noted: No No Abnormalities Noted: No Callus: No Atrophie Blanche: No Crepitus: No Cyanosis: No Excoriation: No Ecchymosis: No Fluctuance: No Erythema: No Friable: No Hemosiderin Staining: No Induration: No Mottled: No Localized Edema: No Pallor: No Rash: No Rubor: No Scarring: No Moisture No Abnormalities Noted: No Dry / Scaly: No Maceration: No Moist: Yes Wound  Preparation Ulcer Cleansing: Rinsed/Irrigated with Saline Topical Anesthetic Applied: None Treatment Notes Wound #1 (Left, Anterior Upper Arm) 1. Cleansed with: Clean wound with Normal Saline 3. Peri-wound Care: Skin Prep 4. Dressing Applied: Promogran 5. Secondary Dressing Applied Bordered Foam Dressing Electronic Signature(s) Signed: 08/21/2015 5:07:38 PM By: Elpidio Eric BSN, RN Entered By: Elpidio Eric on 08/21/2015 09:02:32 Shane Hamilton ([id number]) -------------------------------------------------------------------------------- Vitals Details Patient Name: Shane Hamilton D. Date of Service: 08/21/2015 8:45 AM Medical Record Number: [id number] Patient Account Number: [id number] Date of Birth/Sex: 08-Dec-1965 (49 y.o. Male) Treating RN: Clover Mealy, RN, BSN, Rita Primary Care Physician: Darrow Bussing Other Clinician: Referring Physician: Treating Physician/Extender: BURNS III, WALTER Weeks in Treatment: 0 Vital Signs Time Taken: 08:45 Temperature (F): 98.3 Height (in): 71 Pulse (bpm): 99 Source: Stated Respiratory Rate (breaths/min): 20 Weight (lbs): 426 Blood Pressure (mmHg): 116/62 Source: Measured Reference Range: 80 - 120 mg / dl Body Mass Index (BMI): 59.4 Electronic Signature(s) Signed: 08/21/2015 8:48:25 AM By: Elpidio Eric BSN, RN Entered By: Elpidio Eric on 08/21/2015 08:48:25

## 2015-08-22 DIAGNOSIS — D509 Iron deficiency anemia, unspecified: Secondary | ICD-10-CM | POA: Diagnosis not present

## 2015-08-22 DIAGNOSIS — D631 Anemia in chronic kidney disease: Secondary | ICD-10-CM | POA: Diagnosis not present

## 2015-08-22 DIAGNOSIS — D689 Coagulation defect, unspecified: Secondary | ICD-10-CM | POA: Diagnosis not present

## 2015-08-22 DIAGNOSIS — N186 End stage renal disease: Secondary | ICD-10-CM | POA: Diagnosis not present

## 2015-08-22 DIAGNOSIS — N2581 Secondary hyperparathyroidism of renal origin: Secondary | ICD-10-CM | POA: Diagnosis not present

## 2015-08-22 NOTE — Progress Notes (Signed)
MARCUS, SCHWANDT ([id number]) Visit Report for 08/21/2015 Chief Complaint Document Details Patient Name: Shane Hamilton, Shane Hamilton. Date of Service: 08/21/2015 8:45 AM Medical Record Number: [id number] Patient Account Number: [id number] Date of Birth/Sex: [birth year] (49 y.o. Male) Treating RN: Clover Mealy, RN, BSN, [place] Sink Primary Care Physician: Darrow Bussing Other Clinician: Referring Physician: Treating Physician/Extender: BURNS III, WALTER Weeks in Treatment: 0 Information Obtained from: Patient Chief Complaint Left arm ulceration, status post excision of infected left arm AV fistula. Electronic Signature(s) Signed: 08/21/2015 4:30:30 PM By: Madelaine Bhat MD Entered By: Madelaine Bhat on 08/21/2015 13:20:10 Shane Hamilton ([id number]) -------------------------------------------------------------------------------- HPI Details Patient Name: Shane Hamilton. Date of Service: 08/21/2015 8:45 AM Medical Record Number: [id number] Patient Account Number: [id number] Date of Birth/Sex: [birth year] (49 y.o. Male) Treating RN: Clover Mealy, RN, BSN, [place] Sink Primary Care Physician: Darrow Bussing Other Clinician: Referring Physician: Treating Physician/Extender: BURNS III, WALTER Weeks in Treatment: 0 History of Present Illness HPI Description: 49 year old with history of type 2 diabetes, end-stage renal disease (on hemodialysis), morbid obesity, and sleep apnea. He developed an infected left arm AV fistula and underwent excision by Dr. Gilda Crease on 06/20/2015. Wound has been managed with a wound VAC since that time. He reports progressive and improvement. Wishes to discontinue the wound VAC. No significant pain. No fever or chills. Minimal drainage. Electronic Signature(s) Signed: 08/21/2015 4:30:30 PM By: Madelaine Bhat MD Entered By: Madelaine Bhat on 08/21/2015 13:22:00 Shane Dubin D.  ([id number]) -------------------------------------------------------------------------------- Gaynelle Adu TISS Details Patient Name: Shane Dubin D. Date of Service: 08/21/2015 8:45 AM Medical Record Number: [id number] Patient Account Number: [id number] Date of Birth/Sex: Sep 17, 1966 (49 y.o. Male) Treating RN: Clover Mealy, RN, BSN, [place] Sink Primary Care Physician: Darrow Bussing Other Clinician: Referring Physician: Treating Physician/Extender: BURNS III, Regis Bill in Treatment: 0 Procedure Performed for: Wound #1 Left,Anterior Upper Arm Performed By: Physician BURNS III, Melanie Crazier., MD Post Procedure Diagnosis Same as Pre-procedure Electronic Signature(s) Signed: 08/21/2015 4:30:30 PM By: Madelaine Bhat MD Previous Signature: 08/21/2015 9:15:50 AM Version By: Elpidio Eric BSN, RN Entered By: Madelaine Bhat on 08/21/2015 13:19:25 Shane Hamilton ([id number]) -------------------------------------------------------------------------------- Physical Exam Details Patient Name: OAKLYN, MANS. Date of Service: 08/21/2015 8:45 AM Medical Record Number: [id number] Patient Account Number: [id number] Date of Birth/Sex: 07-19-1966 (50 y.o. Male) Treating RN: Clover Mealy, RN, BSN, [place] Sink Primary Care Physician: Darrow Bussing Other Clinician: Referring Physician: Treating Physician/Extender: BURNS III, WALTER Weeks in Treatment: 0 Constitutional . Pulse regular. Respirations normal and unlabored. Afebrile. Marland Kitchen Respiratory WNL. No retractions.. Breath sounds WNL, No rubs, rales, rhonchi, or wheeze.. Cardiovascular Heart rhythm and rate regular, no murmur or gallop.. Integumentary (Hair, Skin) .Marland Kitchen Neurological Sensation normal to touch, pin,and vibration. Psychiatric Judgement and insight Intact.. Oriented times 3.. No evidence of depression, anxiety, or agitation.. Notes Left upper arm ulceration with hyper-granulation tissue. Treated with silver nitrate. No evidence for  infection. Nontender. Neurovascularly intact distally. Electronic Signature(s) Signed: 08/21/2015 4:30:30 PM By: Madelaine Bhat MD Entered By: Madelaine Bhat on 08/21/2015 13:22:55 Shane Hamilton ([id number]) -------------------------------------------------------------------------------- Physician Orders Details Patient Name: Shane Dubin D. Date of Service: 08/21/2015 8:45 AM Medical Record Number: [id number] Patient Account Number: [id number] Date of Birth/Sex: 1966-03-07 (49 y.o. Male) Treating RN: Clover Mealy, RN, BSN, [place] Sink Primary Care Physician: Darrow Bussing Other Clinician: Referring Physician: Treating Physician/Extender: BURNS III, Regis Bill in Treatment: 0 Verbal / Phone Orders: Yes Clinician: Afful, RN, BSN, Rita Read Back and Verified: Yes Diagnosis Coding Wound Cleansing Wound #1 Left,Anterior Upper Arm o Cleanse wound  with mild soap and water Skin Barriers/Peri-Wound Care Wound #1 Left,Anterior Upper Arm o Skin Prep Primary Wound Dressing Wound #1 Left,Anterior Upper Arm o Promogran Secondary Dressing Wound #1 Left,Anterior Upper Arm o Boardered Foam Dressing Dressing Change Frequency Wound #1 Left,Anterior Upper Arm o Change dressing every day. Follow-up Appointments Wound #1 Left,Anterior Upper Arm o Return Appointment in 2 weeks. Electronic Signature(s) Signed: 08/21/2015 9:17:30 AM By: Elpidio Eric BSN, RN Signed: 08/21/2015 4:30:30 PM By: Madelaine Bhat MD Entered By: Elpidio Eric on 08/21/2015 09:17:30 Shane Hamilton ([id number]) -------------------------------------------------------------------------------- Problem List Details Patient Name: HILLERY, BHALLA. Date of Service: 08/21/2015 8:45 AM Medical Record Number: [id number] Patient Account Number: [id number] Date of Birth/Sex: Oct 12, 1965 (49 y.o. Male) Treating RN: Clover Mealy, RN, BSN, [place] Sink Primary Care Physician: Darrow Bussing Other Clinician: Referring  Physician: Treating Physician/Extender: BURNS III, Regis Bill in Treatment: 0 Active Problems ICD-10 Encounter Code Description Active Date Diagnosis S41.102A Unspecified open wound of left upper arm, initial 08/21/2015 Yes encounter E11.622 Type 2 diabetes mellitus with other skin ulcer 08/21/2015 Yes N18.6 End stage renal disease 08/21/2015 Yes E66.9 Obesity, unspecified 08/21/2015 Yes G47.30 Sleep apnea, unspecified 08/21/2015 Yes Inactive Problems Resolved Problems Electronic Signature(s) Signed: 08/21/2015 4:30:30 PM By: Madelaine Bhat MD Entered By: Madelaine Bhat on 08/21/2015 13:19:13 Shane Dubin D. ([id number]) -------------------------------------------------------------------------------- Progress Note/History and Physical Details Patient Name: Shane Dubin D. Date of Service: 08/21/2015 8:45 AM Medical Record Number: [id number] Patient Account Number: [id number] Date of Birth/Sex: 1966/03/18 (49 y.o. Male) Treating RN: Clover Mealy, RN, BSN, [place] Sink Primary Care Physician: Darrow Bussing Other Clinician: Referring Physician: Treating Physician/Extender: BURNS III, Regis Bill in Treatment: 0 Subjective Chief Complaint Information obtained from Patient Left arm ulceration, status post excision of infected left arm AV fistula. History of Present Illness (HPI) 49 year old with history of type 2 diabetes, end-stage renal disease (on hemodialysis), morbid obesity, and sleep apnea. He developed an infected left arm AV fistula and underwent excision by Dr. Gilda Crease on 06/20/2015. Wound has been managed with a wound VAC since that time. He reports progressive and improvement. Wishes to discontinue the wound VAC. No significant pain. No fever or chills. Minimal drainage. Wound History Patient presents with 1 open wound that has been present for approximately month. Patient has been treating wound in the following manner: npwt. Laboratory tests have not been  performed in the last month. Patient reportedly has not tested positive for an antibiotic resistant organism. Patient reportedly has not tested positive for osteomyelitis. Patient reportedly has not had testing performed to evaluate circulation in the legs. Patient History Information obtained from Patient. Allergies aspirin Family History Diabetes - Mother, Father, Siblings, Hypertension - Mother, Siblings, Father, Stroke - Mother, Thyroid Problems - Mother, No family history of Cancer, Heart Disease, Hereditary Spherocytosis, Kidney Disease, Lung Disease, Seizures, Tuberculosis. Social History Never smoker, Marital Status - Married, Alcohol Use - Never, Drug Use - No History, Caffeine Use - Moderate. Medical History AMELIO, BROSKY ([id number]) Eyes Denies history of Cataracts, Glaucoma, Optic Neuritis Ear/Nose/Mouth/Throat Denies history of Chronic sinus problems/congestion, Middle ear problems Hematologic/Lymphatic Patient has history of Anemia Denies history of Hemophilia, Human Immunodeficiency Virus, Lymphedema, Sickle Cell Disease Respiratory Patient has history of Sleep Apnea Denies history of Aspiration, Asthma, Chronic Obstructive Pulmonary Disease (COPD), Pneumothorax, Tuberculosis Cardiovascular Patient has history of Hypertension Gastrointestinal Denies history of Cirrhosis , Colitis, Crohn s, Hepatitis A, Hepatitis B, Hepatitis C Endocrine Patient has history of Type II Diabetes Genitourinary Patient has history of End Stage Renal Disease -  HD MWF Immunological Denies history of Lupus Erythematosus, Raynaud s, Scleroderma Integumentary (Skin) Denies history of History of Burn, History of pressure wounds Musculoskeletal Denies history of Gout, Rheumatoid Arthritis, Osteoarthritis, Osteomyelitis Neurologic Patient has history of Neuropathy - diaBETIC Denies history of Dementia, Quadriplegia, Paraplegia, Seizure Disorder Oncologic Denies history of  Received Chemotherapy, Received Radiation Psychiatric Denies history of Anorexia/bulimia, Confinement Anxiety Patient is treated with Insulin, Oral Agents. Blood sugar is tested. Blood sugar results noted at the following times: Breakfast - 88. Review of Systems (ROS) Constitutional Symptoms (General Health) The patient has no complaints or symptoms. Eyes Complains or has symptoms of Glasses / Contacts. Ear/Nose/Mouth/Throat The patient has no complaints or symptoms. Hematologic/Lymphatic Complains or has symptoms of Bleeding / Clotting Disorders. Respiratory The patient has no complaints or symptoms. Cardiovascular The patient has no complaints or symptoms. Gastrointestinal The patient has no complaints or symptoms. DAMARIE, SCHOOL[place] ([id number]) Endocrine The patient has no complaints or symptoms. Genitourinary Complains or has symptoms of Kidney failure/ Dialysis. Immunological The patient has no complaints or symptoms. Integumentary (Skin) Complains or has symptoms of Wounds. Musculoskeletal The patient has no complaints or symptoms. Neurologic Complains or has symptoms of Numbness/parasthesias. Oncologic The patient has no complaints or symptoms. Psychiatric The patient has no complaints or symptoms. Objective Constitutional Pulse regular. Respirations normal and unlabored. Afebrile. Vitals Time Taken: 8:45 AM, Height: 71 in, Source: Stated, Weight: 426 lbs, Source: Measured, BMI: 59.4, Temperature: 98.3 F, Pulse: 99 bpm, Respiratory Rate: 20 breaths/min, Blood Pressure: 116/62 mmHg. Respiratory WNL. No retractions.. Breath sounds WNL, No rubs, rales, rhonchi, or wheeze.. Cardiovascular Heart rhythm and rate regular, no murmur or gallop.Marland Kitchen Neurological Sensation normal to touch, pin,and vibration. Psychiatric Judgement and insight Intact.. Oriented times 3.. No evidence of depression, anxiety, or agitation.. General Notes: Left upper arm ulceration with  hyper-granulation tissue. Treated with silver nitrate. No evidence for infection. Nontender. Neurovascularly intact distally. Integumentary (Hair, Skin) BILLAL, ROLLO D. ([id number]) Wound #1 status is Open. Original cause of wound was Surgical Injury. The wound is located on the Left,Anterior Upper Arm. The wound measures 1cm length x 3cm width x 0.1cm depth; 2.356cm^2 area and 0.236cm^3 volume. The wound is limited to skin breakdown. There is no tunneling or undermining noted. There is a medium amount of sanguinous drainage noted. The wound margin is distinct with the outline attached to the wound base. There is large (67-100%) granulation within the wound bed. There is no necrotic tissue within the wound bed. The periwound skin appearance exhibited: Moist. The periwound skin appearance did not exhibit: Callus, Crepitus, Excoriation, Fluctuance, Friable, Induration, Localized Edema, Rash, Scarring, Dry/Scaly, Maceration, Atrophie Blanche, Cyanosis, Ecchymosis, Hemosiderin Staining, Mottled, Pallor, Rubor, Erythema. Assessment Active Problems ICD-10 S41.102A - Unspecified open wound of left upper arm, initial encounter E11.622 - Type 2 diabetes mellitus with other skin ulcer N18.6 - End stage renal disease E66.9 - Obesity, unspecified G47.30 - Sleep apnea, unspecified Left upper arm ulceration status post excision of infected AV fistula. Procedures Wound #1 Wound #1 is an Open Surgical Wound located on the Left,Anterior Upper Arm . An CHEM CAUT GRANULATION TISS procedure was performed by BURNS III, Melanie Crazier., MD. Post procedure Diagnosis Wound #1: Same as Pre-Procedure Plan Wound Cleansing: Wound #1 Left,Anterior Upper Arm: Cleanse wound with mild soap and water SHISHIR, KRANTZ ([id number]) Skin Barriers/Peri-Wound Care: Wound #1 Left,Anterior Upper Arm: Skin Prep Primary Wound Dressing: Wound #1 Left,Anterior Upper Arm: Promogran Secondary Dressing: Wound #1  Left,Anterior Upper Arm: Boardered Foam Dressing Dressing  Change Frequency: Wound #1 Left,Anterior Upper Arm: Change dressing every day. Follow-up Appointments: Wound #1 Left,Anterior Upper Arm: Return Appointment in 2 weeks. Promogran dressing changes. Discontinue wound VAC. Electronic Signature(s) Signed: 08/21/2015 4:30:30 PM By: Madelaine Bhat MD Entered By: Madelaine Bhat on 08/21/2015 13:24:32 Shane Hamilton ([id number]) -------------------------------------------------------------------------------- ROS/PFSH Details Patient Name: Shane Dubin D. Date of Service: 08/21/2015 8:45 AM Medical Record Number: [id number] Patient Account Number: [id number] Date of Birth/Sex: 11-08-1965 (49 y.o. Male) Treating RN: Clover Mealy, RN, BSN, Rita Primary Care Physician: Darrow Bussing Other Clinician: Referring Physician: Treating Physician/Extender: BURNS III, WALTER Weeks in Treatment: 0 Label Progress Note Print Version as History and Physical for this encounter Information Obtained From Patient Wound History Do you currently have one or more open woundso Yes How many open wounds do you currently haveo 1 Approximately how long have you had your woundso month How have you been treating your wound(s) until nowo npwt Has your wound(s) ever healed and then re-openedo No Have you had any lab work done in the past montho No Have you tested positive for an antibiotic resistant organism (MRSA, VRE)o No Have you tested positive for osteomyelitis (bone infection)o No Have you had any tests for circulation on your legso No Eyes Complaints and Symptoms: Positive for: Glasses / Contacts Medical History: Negative for: Cataracts; Glaucoma; Optic Neuritis Hematologic/Lymphatic Complaints and Symptoms: Positive for: Bleeding / Clotting Disorders Medical History: Positive for: Anemia Negative for: Hemophilia; Human Immunodeficiency Virus; Lymphedema; Sickle Cell  Disease Genitourinary Complaints and Symptoms: Positive for: Kidney failure/ Dialysis Medical History: Positive for: End Stage Renal Disease - HD MWF Integumentary (Skin) GRAYLIN, SPERLING D. ([id number]) Complaints and Symptoms: Positive for: Wounds Medical History: Negative for: History of Burn; History of pressure wounds Neurologic Complaints and Symptoms: Positive for: Numbness/parasthesias Medical History: Positive for: Neuropathy - diaBETIC Negative for: Dementia; Quadriplegia; Paraplegia; Seizure Disorder Constitutional Symptoms (General Health) Complaints and Symptoms: No Complaints or Symptoms Ear/Nose/Mouth/Throat Complaints and Symptoms: No Complaints or Symptoms Medical History: Negative for: Chronic sinus problems/congestion; Middle ear problems Respiratory Complaints and Symptoms: No Complaints or Symptoms Medical History: Positive for: Sleep Apnea Negative for: Aspiration; Asthma; Chronic Obstructive Pulmonary Disease (COPD); Pneumothorax; Tuberculosis Cardiovascular Complaints and Symptoms: No Complaints or Symptoms Medical History: Positive for: Hypertension Gastrointestinal Complaints and Symptoms: No Complaints or Symptoms Medical HistoryBLAZE, NYLUND ([id number]) Negative for: Cirrhosis ; Colitis; Crohnos; Hepatitis A; Hepatitis B; Hepatitis C Endocrine Complaints and Symptoms: No Complaints or Symptoms Medical History: Positive for: Type II Diabetes Time with diabetes: 5years Treated with: Insulin, Oral agents Blood sugar tested every day: Yes Tested : Blood sugar testing results: Breakfast: 88 Immunological Complaints and Symptoms: No Complaints or Symptoms Medical History: Negative for: Lupus Erythematosus; Raynaudos; Scleroderma Musculoskeletal Complaints and Symptoms: No Complaints or Symptoms Medical History: Negative for: Gout; Rheumatoid Arthritis; Osteoarthritis; Osteomyelitis Oncologic Complaints and Symptoms: No  Complaints or Symptoms Medical History: Negative for: Received Chemotherapy; Received Radiation Psychiatric Complaints and Symptoms: No Complaints or Symptoms Medical History: Negative for: Anorexia/bulimia; Confinement Anxiety Family and Social History Cancer: No; Diabetes: Yes - Mother, Father, Siblings; Heart Disease: No; Hereditary Spherocytosis: No; Hypertension: Yes - Mother, Siblings, Father; Kidney Disease: No; Lung Disease: No; Seizures: No; BRIDGER, PIZZI ([id number]) Stroke: Yes - Mother; Thyroid Problems: Yes - Mother; Tuberculosis: No; Never smoker; Marital Status - Married; Alcohol Use: Never; Drug Use: No History; Caffeine Use: Moderate; Financial Concerns: No; Food, Clothing or Shelter Needs: No; Support System Lacking: No; Transportation Concerns: No; Advanced Directives: No; Patient  does not want information on Advanced Directives; Living Will: No Physician Affirmation I have reviewed and agree with the above information. Electronic Signature(s) Signed: 08/21/2015 4:30:30 PM By: Madelaine BhatBurns, III, Walter MD Signed: 08/21/2015 5:07:38 PM By: Elpidio EricAfful, Rita BSN, RN Previous Signature: 08/21/2015 8:55:40 AM Version By: Elpidio EricAfful, Rita BSN, RN Entered By: Madelaine BhatBurns, III, Walter on 08/21/2015 13:24:16 Shane DubinWILSON, Tavin D. ([id number]) -------------------------------------------------------------------------------- SuperBill Details Patient Name: Shane MinaWILSON, Garmon D. Date of Service: 08/21/2015 Medical Record Number: [id number] Patient Account Number: [id number] Date of Birth/Sex: 04/29/1966 31(49 y.o. Male) Treating RN: Clover MealyAfful, RN, BSN, [place] Primary Care Physician: Darrow BussingKOIRALA, DIBAS Other Clinician: Referring Physician: Treating Physician/Extender: BURNS III, Regis BillWALTER Weeks in Treatment: 0 Diagnosis Coding ICD-10 Codes Code Description S41.102A Unspecified open wound of left upper arm, initial encounter E11.622 Type 2 diabetes mellitus with other skin ulcer N18.6 End stage renal  disease E66.9 Obesity, unspecified G47.30 Sleep apnea, unspecified Facility Procedures CPT4 Code: [id number] Description: 99213 - WOUND CARE VISIT-LEV 3 EST PT Modifier: Quantity: 1 CPT4 Code: [id number] Description: 17250 - CHEM CAUT GRANULATION TISS ICD-10 Description Diagnosis S41.102A Unspecified open wound of left upper arm, initial E11.622 Type 2 diabetes mellitus with other skin ulcer Modifier: encounter Quantity: 1 Physician Procedures CPT4 Code: [id number]: [id number] Description: WC PHYS LEVEL 3 o NEW PT ICD-10 Description Diagnosis S41.102A Unspecified open wound of left upper arm, initial E11.622 Type 2 diabetes mellitus with other skin ulcer Modifier: encounter Quantity: 1 CPT4 Code: [id number] Description: 17250 - WC PHYS CHEM CAUT GRAN TISSUE ICD-10 Description Diagnosis S41.102A Unspecified open wound of left upper arm, initial E11.622 Type 2 diabetes mellitus with other skin ulcer Modifier: encounter Quantity: 1 Electronic Signature(s) Shane MinaWILSON, Yazir D. ([id number]) Signed: 08/21/2015 4:30:30 PM By: Madelaine BhatBurns, III, Walter MD Entered By: Madelaine BhatBurns, III, Walter on 08/21/2015 13:23:59

## 2015-08-24 DIAGNOSIS — D689 Coagulation defect, unspecified: Secondary | ICD-10-CM | POA: Diagnosis not present

## 2015-08-24 DIAGNOSIS — D509 Iron deficiency anemia, unspecified: Secondary | ICD-10-CM | POA: Diagnosis not present

## 2015-08-24 DIAGNOSIS — N186 End stage renal disease: Secondary | ICD-10-CM | POA: Diagnosis not present

## 2015-08-24 DIAGNOSIS — N2581 Secondary hyperparathyroidism of renal origin: Secondary | ICD-10-CM | POA: Diagnosis not present

## 2015-08-24 DIAGNOSIS — D631 Anemia in chronic kidney disease: Secondary | ICD-10-CM | POA: Diagnosis not present

## 2015-08-26 DIAGNOSIS — D631 Anemia in chronic kidney disease: Secondary | ICD-10-CM | POA: Diagnosis not present

## 2015-08-26 DIAGNOSIS — N2581 Secondary hyperparathyroidism of renal origin: Secondary | ICD-10-CM | POA: Diagnosis not present

## 2015-08-26 DIAGNOSIS — D689 Coagulation defect, unspecified: Secondary | ICD-10-CM | POA: Diagnosis not present

## 2015-08-26 DIAGNOSIS — N186 End stage renal disease: Secondary | ICD-10-CM | POA: Diagnosis not present

## 2015-08-26 DIAGNOSIS — D509 Iron deficiency anemia, unspecified: Secondary | ICD-10-CM | POA: Diagnosis not present

## 2015-08-27 DIAGNOSIS — N2581 Secondary hyperparathyroidism of renal origin: Secondary | ICD-10-CM | POA: Diagnosis not present

## 2015-08-27 DIAGNOSIS — Z86711 Personal history of pulmonary embolism: Secondary | ICD-10-CM | POA: Diagnosis not present

## 2015-08-27 DIAGNOSIS — D689 Coagulation defect, unspecified: Secondary | ICD-10-CM | POA: Diagnosis not present

## 2015-08-27 DIAGNOSIS — D631 Anemia in chronic kidney disease: Secondary | ICD-10-CM | POA: Diagnosis not present

## 2015-08-27 DIAGNOSIS — D509 Iron deficiency anemia, unspecified: Secondary | ICD-10-CM | POA: Diagnosis not present

## 2015-08-27 DIAGNOSIS — N186 End stage renal disease: Secondary | ICD-10-CM | POA: Diagnosis not present

## 2015-08-27 DIAGNOSIS — I2782 Chronic pulmonary embolism: Secondary | ICD-10-CM | POA: Diagnosis not present

## 2015-08-30 DIAGNOSIS — N186 End stage renal disease: Secondary | ICD-10-CM | POA: Diagnosis not present

## 2015-08-30 DIAGNOSIS — D631 Anemia in chronic kidney disease: Secondary | ICD-10-CM | POA: Diagnosis not present

## 2015-08-30 DIAGNOSIS — D509 Iron deficiency anemia, unspecified: Secondary | ICD-10-CM | POA: Diagnosis not present

## 2015-08-30 DIAGNOSIS — D689 Coagulation defect, unspecified: Secondary | ICD-10-CM | POA: Diagnosis not present

## 2015-08-30 DIAGNOSIS — N2581 Secondary hyperparathyroidism of renal origin: Secondary | ICD-10-CM | POA: Diagnosis not present

## 2015-09-01 DIAGNOSIS — D631 Anemia in chronic kidney disease: Secondary | ICD-10-CM | POA: Diagnosis not present

## 2015-09-01 DIAGNOSIS — N2581 Secondary hyperparathyroidism of renal origin: Secondary | ICD-10-CM | POA: Diagnosis not present

## 2015-09-01 DIAGNOSIS — D689 Coagulation defect, unspecified: Secondary | ICD-10-CM | POA: Diagnosis not present

## 2015-09-01 DIAGNOSIS — N186 End stage renal disease: Secondary | ICD-10-CM | POA: Diagnosis not present

## 2015-09-01 DIAGNOSIS — D509 Iron deficiency anemia, unspecified: Secondary | ICD-10-CM | POA: Diagnosis not present

## 2015-09-03 ENCOUNTER — Encounter: Payer: Self-pay | Admitting: Vascular Surgery

## 2015-09-03 DIAGNOSIS — D631 Anemia in chronic kidney disease: Secondary | ICD-10-CM | POA: Diagnosis not present

## 2015-09-03 DIAGNOSIS — D509 Iron deficiency anemia, unspecified: Secondary | ICD-10-CM | POA: Diagnosis not present

## 2015-09-03 DIAGNOSIS — N186 End stage renal disease: Secondary | ICD-10-CM | POA: Diagnosis not present

## 2015-09-03 DIAGNOSIS — D689 Coagulation defect, unspecified: Secondary | ICD-10-CM | POA: Diagnosis not present

## 2015-09-03 DIAGNOSIS — N2581 Secondary hyperparathyroidism of renal origin: Secondary | ICD-10-CM | POA: Diagnosis not present

## 2015-09-04 ENCOUNTER — Ambulatory Visit: Payer: Medicare Other | Admitting: Surgery

## 2015-09-04 DIAGNOSIS — Z992 Dependence on renal dialysis: Secondary | ICD-10-CM | POA: Diagnosis not present

## 2015-09-04 DIAGNOSIS — N186 End stage renal disease: Secondary | ICD-10-CM | POA: Diagnosis not present

## 2015-09-04 DIAGNOSIS — E1129 Type 2 diabetes mellitus with other diabetic kidney complication: Secondary | ICD-10-CM | POA: Diagnosis not present

## 2015-09-05 DIAGNOSIS — N186 End stage renal disease: Secondary | ICD-10-CM | POA: Diagnosis not present

## 2015-09-05 DIAGNOSIS — D509 Iron deficiency anemia, unspecified: Secondary | ICD-10-CM | POA: Diagnosis not present

## 2015-09-05 DIAGNOSIS — D631 Anemia in chronic kidney disease: Secondary | ICD-10-CM | POA: Diagnosis not present

## 2015-09-06 ENCOUNTER — Ambulatory Visit (INDEPENDENT_AMBULATORY_CARE_PROVIDER_SITE_OTHER)
Admission: RE | Admit: 2015-09-06 | Discharge: 2015-09-06 | Disposition: A | Discharge status: Home / Self Care | Payer: Medicare Other | Source: Ambulatory Visit | Attending: Vascular Surgery | Admitting: Vascular Surgery

## 2015-09-06 ENCOUNTER — Other Ambulatory Visit: Payer: Self-pay

## 2015-09-06 ENCOUNTER — Ambulatory Visit (INDEPENDENT_AMBULATORY_CARE_PROVIDER_SITE_OTHER): Payer: Medicare Other | Admitting: Vascular Surgery

## 2015-09-06 ENCOUNTER — Ambulatory Visit (HOSPITAL_COMMUNITY)
Admission: RE | Admit: 2015-09-06 | Discharge: 2015-09-06 | Disposition: A | Discharge status: Home / Self Care | Payer: Medicare Other | Source: Ambulatory Visit | Attending: Vascular Surgery | Admitting: Vascular Surgery

## 2015-09-06 VITALS — BP 173/96 | HR 86 | Temp 98.3°F | Resp 22 | Ht 71.0 in | Wt >= 6400 oz

## 2015-09-06 DIAGNOSIS — Z01810 Encounter for preprocedural cardiovascular examination: Secondary | ICD-10-CM

## 2015-09-06 DIAGNOSIS — N186 End stage renal disease: Secondary | ICD-10-CM | POA: Diagnosis not present

## 2015-09-06 DIAGNOSIS — I120 Hypertensive chronic kidney disease with stage 5 chronic kidney disease or end stage renal disease: Secondary | ICD-10-CM | POA: Diagnosis not present

## 2015-09-06 DIAGNOSIS — E1165 Type 2 diabetes mellitus with hyperglycemia: Secondary | ICD-10-CM | POA: Insufficient documentation

## 2015-09-06 NOTE — Progress Notes (Signed)
Established Dialysis Access  History of Present Illness  Shane Hamilton is a 49 y.o. (07/21/1966) male who presents for re-evaluation for permanent access.  The patient is right hand dominant.  Previous access procedures have been completed in both arms.  The patient's complication from previous access procedures include: thrombosis and infection.  Her most recent Left arm access was removed for infection reportedly .  The patient has never had a previous PPM placed.  Past Medical History  Diagnosis Date  . Type 2 diabetes mellitus, uncontrolled (HCC)        . Morbid obesity (HCC)   . OSA (obstructive sleep apnea)   . Septic shock(785.52)   . Anemia   . Hypertension   . Seizures (HCC)   . Gout   . ESRD (end stage renal disease) (HCC)   . LOC (loss of consciousness)   . DVT (deep venous thrombosis) (HCC) 2011  . Cardiomegaly   . Chronic anticoagulation   . Chronic pancreatitis (HCC)   . Secondary hyperparathyroidism [facility](HCC)     Past Surgical History  Procedure Laterality Date  . Av fistula placement    . Amputation finger / thumb Right   . I&d extremity Left 06/20/2015    Procedure: IRRIGATION AND DEBRIDEMENT EXTREMITY;  Surgeon: Renford DillsGregory G Schnier, MD;  Location: [facility] ORS;  Service: Vascular;  Laterality: Left;  . Application of wound vac Left 06/20/2015    Procedure: APPLICATION OF WOUND VAC;  Surgeon: Renford DillsGregory G Schnier, MD;  Location: [facility] ORS;  Service: Vascular;  Laterality: Left;  . Av fistula placement Left 06/20/2015    Procedure: ARTERIOVENOUS (AV) FISTULA  ligation , excision;  Surgeon: Renford DillsGregory G Schnier, MD;  Location: [facility] ORS;  Service: Vascular;  Laterality: Left;  . Peripheral vascular catheterization N/A 06/25/2015    Procedure: Dialysis/Perma Catheter Insertion;  Surgeon: Annice NeedyJason S Dew, MD;  Location: [facility] INVASIVE CV LAB;  Service: Cardiovascular;  Laterality: N/A;  . Insertion of dialysis catheter N/A 06/25/2015    Procedure: place a tunnelled catheter for long  term use;  Surgeon: Annice NeedyJason S Dew, MD;  Location: [facility] ORS;  Service: Vascular;  Laterality: N/A;  . Insertion of dialysis catheter Left 07/25/2015    Procedure: INSERTION OF LEFT INTERNAL JUGULAR DIALYSIS CATHETER;  Surgeon: Pryor OchoaJames D Lawson, MD;  [facility];  Service: Vascular;  Laterality: Left;  . Insertion of dialysis catheter Left 08/01/2015    Procedure: REMOVAL OF DIALYSIS CATHETER; PLACEMNET OF NEW DIALYSIS CATHETER;  Surgeon: Pryor OchoaJames D Lawson, MD;  [facility];  Service: Vascular;  Laterality: Left;    Social History   Social History  . Marital Status: Married    Spouse Name: N/A  . Number of Children: N/A  . Years of Education: N/A   Occupational History  . disabled     Social History Main Topics  . Smoking status: Never Smoker   . Smokeless tobacco: Never Used  . Alcohol Use: No  . Drug Use: No  . Sexual Activity: Not on file   Other Topics Concern  . Not on file   Social History Narrative    Family History  Problem Relation Age of Onset  . Diabetes Mother   . Hypertension Mother   . Diabetes Father   . Hypertension Father   . Hypertension Brother     Current Outpatient Prescriptions  Medication Sig Dispense Refill  . allopurinol (ZYLOPRIM) 300 MG tablet Take 300 mg by mouth 2 (two) times daily.    .Marland Kitchen  atorvastatin (LIPITOR) 10 MG tablet Take 10 mg by mouth daily.    . calcium acetate (PHOSLO) 667 MG capsule Take 2,001 mg by mouth 3 (three) times daily with meals.     . carvedilol (COREG) 3.125 MG tablet Take 1 tablet (3.125 mg total) by mouth 2 (two) times daily with a meal. 60 tablet 0  . ceFAZolin in dextrose 5 % 50 mL ivpb 2 gram IV at the end of each dialysis- for 3 weeks. 1000 mL 0  . cinacalcet (SENSIPAR) 60 MG tablet Take 60 mg by mouth daily.    . clonazePAM (KLONOPIN) 1 MG tablet Take 1 mg by mouth 2 (two) times daily.    . colchicine 0.6 MG tablet Take 0.6 mg by mouth daily.    Marland Kitchen gabapentin (NEURONTIN) 300 MG capsule Take 300 mg by mouth every  8 (eight) hours.    . hydrOXYzine (ATARAX/VISTARIL) 25 MG tablet Take 25 mg by mouth 3 (three) times daily as needed for anxiety or itching.     . insulin aspart protamine- aspart (NOVOLOG MIX 70/30) (70-30) 100 UNIT/ML injection Inject 0.1 mLs (10 Units total) into the skin 3 (three) times daily with meals. (Patient taking differently: Inject 35 Units into the skin 3 (three) times daily with meals. ) 10 mL 11  . insulin glargine (LANTUS) 100 UNIT/ML injection Inject 0.35 mLs (35 Units total) into the skin at bedtime. (Patient taking differently: Inject 70 Units into the skin at bedtime. ) 10 mL 11  . lanthanum (FOSRENOL) 1000 MG chewable tablet Chew 1,000 mg by mouth 3 (three) times daily with meals.     . multivitamin (RENA-VIT) TABS tablet Take 1 tablet by mouth daily.   3  . omeprazole (PRILOSEC) 20 MG capsule Take 20 mg by mouth 2 (two) times daily before a meal.     . warfarin (COUMADIN) 7.5 MG tablet Take 7.5 mg by mouth See admin instructions. Pt takes 7.5mg  on Tuesday, Wednesday, Thursday, Saturday and Sunday and  on Monday and Friday    . BD PEN NEEDLE NANO U/F 32G X 4 MM MISC     . LANTUS SOLOSTAR 100 UNIT/ML Solostar Pen     . NOVOLOG FLEXPEN 100 UNIT/ML FlexPen     . Oxycodone HCl 10 MG TABS Take 1 tablet (10 mg total) by mouth 2 (two) times daily as needed (pain). (Patient not taking: Reported on 09/06/2015) 20 tablet 0  . oxyCODONE-acetaminophen (ROXICET) 5-325 MG tablet Take 1 tablet by mouth every 6 (six) hours as needed. (Patient not taking: Reported on 09/06/2015) 15 tablet 0  . warfarin (COUMADIN) 10 MG tablet      No current facility-administered medications for this visit.     Allergies  Allergen Reactions  . Aspirin Other (See Comments)    Reaction:  GI upset      REVIEW OF SYSTEMS:  (Positives checked otherwise negative)  CARDIOVASCULAR:    chest pain,   chest pressure,   palpitations,   shortness of breath when laying flat,   shortness of  breath with exertion,    pain in feet when walking,   pain in feet when laying flat,  history of blood clot in veins (DVT),   history of phlebitis,   swelling in legs,   varicose veins  PULMONARY:    productive cough,   asthma,   wheezing  NEUROLOGIC:    weakness in arms  or legs,  [ ] numbness in arms or legs,  [ ] difficulty speaking or slurred speech,  [ ] temporary loss of vision in one eye,  [ ] dizziness  HEMATOLOGIC:   [ ] bleeding problems,  [ ] problems with blood clotting too easily  MUSCULOSKEL:   [ ] joint pain, [ ] joint swelling  GASTROINTEST:   [ ] vomiting blood,  [ ] blood in stool     GENITOURINARY:   [ ] burning with urination,  [ ] blood in urine [x] ESRD-HD: M-T-R-S  PSYCHIATRIC:   [ ] history of major depression  INTEGUMENTARY:   [ ] rashes,  [ ] ulcers  CONSTITUTIONAL:   [ ] fever,  [ ] chills      Physical Examination  Filed Vitals:   09/06/15 1232 09/06/15 1235  BP: 160/93 173/96  Pulse: 108 86  Temp: 98.3 F (36.8 C)   Resp: 22   Height: 5' 11" (1.803 m)   Weight: 434 lb (196.861 kg)   SpO2: 94%    Body mass index is 60.56 kg/(m^2).  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 male who presents with ESRD requiring hemodialysis, morbid obesity   R side access is a poor options given known R innominate vein  stenosis/occlusion  Would get B arm venogram to determine if any residual veins present in either arm  Thigh AVG would be difficulty given his severe obesity.  He is at high risk for a groin complication.  Patient wants to be scheduled on a Friday.  Will schedule with Dr. Fields on 9 DEC 16.   The patient will follow up after that study to discuss his options.   Jalik Gellatly, MD [facility]  Office: [phone] Pager: [phone]  09/06/2015, 1:03 PM    

## 2015-09-07 DIAGNOSIS — D509 Iron deficiency anemia, unspecified: Secondary | ICD-10-CM | POA: Diagnosis not present

## 2015-09-07 DIAGNOSIS — N186 End stage renal disease: Secondary | ICD-10-CM | POA: Diagnosis not present

## 2015-09-07 DIAGNOSIS — D631 Anemia in chronic kidney disease: Secondary | ICD-10-CM | POA: Diagnosis not present

## 2015-09-09 DIAGNOSIS — N186 End stage renal disease: Secondary | ICD-10-CM | POA: Diagnosis not present

## 2015-09-09 DIAGNOSIS — D631 Anemia in chronic kidney disease: Secondary | ICD-10-CM | POA: Diagnosis not present

## 2015-09-09 DIAGNOSIS — D509 Iron deficiency anemia, unspecified: Secondary | ICD-10-CM | POA: Diagnosis not present

## 2015-09-10 ENCOUNTER — Encounter (HOSPITAL_COMMUNITY): Payer: Self-pay | Admitting: Pharmacy Technician

## 2015-09-10 DIAGNOSIS — N186 End stage renal disease: Secondary | ICD-10-CM | POA: Diagnosis not present

## 2015-09-10 DIAGNOSIS — D509 Iron deficiency anemia, unspecified: Secondary | ICD-10-CM | POA: Diagnosis not present

## 2015-09-10 DIAGNOSIS — D631 Anemia in chronic kidney disease: Secondary | ICD-10-CM | POA: Diagnosis not present

## 2015-09-12 DIAGNOSIS — D631 Anemia in chronic kidney disease: Secondary | ICD-10-CM | POA: Diagnosis not present

## 2015-09-12 DIAGNOSIS — N186 End stage renal disease: Secondary | ICD-10-CM | POA: Diagnosis not present

## 2015-09-12 DIAGNOSIS — D509 Iron deficiency anemia, unspecified: Secondary | ICD-10-CM | POA: Diagnosis not present

## 2015-09-13 ENCOUNTER — Ambulatory Visit (HOSPITAL_COMMUNITY)
Admission: RE | Admit: 2015-09-13 | Discharge: 2015-09-13 | Disposition: A | Discharge status: Home / Self Care | Payer: Medicare Other | Source: Ambulatory Visit | Attending: Vascular Surgery | Admitting: Vascular Surgery

## 2015-09-13 ENCOUNTER — Encounter (HOSPITAL_COMMUNITY): Payer: Self-pay | Admitting: Vascular Surgery

## 2015-09-13 ENCOUNTER — Encounter (HOSPITAL_COMMUNITY)
Admission: RE | Disposition: A | Discharge status: Home / Self Care | Payer: Self-pay | Source: Ambulatory Visit | Attending: Vascular Surgery

## 2015-09-13 ENCOUNTER — Telehealth: Payer: Self-pay | Admitting: Vascular Surgery

## 2015-09-13 DIAGNOSIS — Z89011 Acquired absence of right thumb: Secondary | ICD-10-CM | POA: Diagnosis not present

## 2015-09-13 DIAGNOSIS — G4733 Obstructive sleep apnea (adult) (pediatric): Secondary | ICD-10-CM | POA: Diagnosis not present

## 2015-09-13 DIAGNOSIS — Z992 Dependence on renal dialysis: Secondary | ICD-10-CM | POA: Diagnosis not present

## 2015-09-13 DIAGNOSIS — Z6841 Body Mass Index (BMI) 40.0 and over, adult: Secondary | ICD-10-CM | POA: Diagnosis not present

## 2015-09-13 DIAGNOSIS — N2581 Secondary hyperparathyroidism of renal origin: Secondary | ICD-10-CM | POA: Insufficient documentation

## 2015-09-13 DIAGNOSIS — I120 Hypertensive chronic kidney disease with stage 5 chronic kidney disease or end stage renal disease: Secondary | ICD-10-CM | POA: Insufficient documentation

## 2015-09-13 DIAGNOSIS — Z86718 Personal history of other venous thrombosis and embolism: Secondary | ICD-10-CM | POA: Insufficient documentation

## 2015-09-13 DIAGNOSIS — Z794 Long term (current) use of insulin: Secondary | ICD-10-CM | POA: Diagnosis not present

## 2015-09-13 DIAGNOSIS — Z79899 Other long term (current) drug therapy: Secondary | ICD-10-CM | POA: Diagnosis not present

## 2015-09-13 DIAGNOSIS — E1122 Type 2 diabetes mellitus with diabetic chronic kidney disease: Secondary | ICD-10-CM | POA: Insufficient documentation

## 2015-09-13 DIAGNOSIS — I82290 Acute embolism and thrombosis of other thoracic veins: Secondary | ICD-10-CM | POA: Diagnosis not present

## 2015-09-13 DIAGNOSIS — M109 Gout, unspecified: Secondary | ICD-10-CM | POA: Insufficient documentation

## 2015-09-13 DIAGNOSIS — Z89021 Acquired absence of right finger(s): Secondary | ICD-10-CM | POA: Diagnosis not present

## 2015-09-13 DIAGNOSIS — I82B11 Acute embolism and thrombosis of right subclavian vein: Secondary | ICD-10-CM | POA: Insufficient documentation

## 2015-09-13 DIAGNOSIS — N186 End stage renal disease: Secondary | ICD-10-CM | POA: Insufficient documentation

## 2015-09-13 DIAGNOSIS — N184 Chronic kidney disease, stage 4 (severe): Secondary | ICD-10-CM | POA: Diagnosis not present

## 2015-09-13 HISTORY — PX: PERIPHERAL VASCULAR CATHETERIZATION: SHX172C

## 2015-09-13 LAB — POCT I-STAT, CHEM 8
BUN: 41 mg/dL — ABNORMAL HIGH (ref 6–20)
CHLORIDE: 100 mmol/L — AB (ref 101–111)
Calcium, Ion: 0.95 mmol/L — ABNORMAL LOW (ref 1.12–1.23)
Creatinine, Ser: 7.9 mg/dL — ABNORMAL HIGH (ref 0.61–1.24)
GLUCOSE: 129 mg/dL — AB (ref 65–99)
HEMATOCRIT: 42 % (ref 39.0–52.0)
Hemoglobin: 14.3 g/dL (ref 13.0–17.0)
POTASSIUM: 3.7 mmol/L (ref 3.5–5.1)
Sodium: 137 mmol/L (ref 135–145)
TCO2: 26 mmol/L (ref 0–100)

## 2015-09-13 LAB — PROTIME-INR
INR: 2.62 — ABNORMAL HIGH (ref 0.00–1.49)
Prothrombin Time: 27.6 seconds — ABNORMAL HIGH (ref 11.6–15.2)

## 2015-09-13 SURGERY — UPPER EXTREMITY VENOGRAPHY
Anesthesia: LOCAL | Laterality: Bilateral

## 2015-09-13 MED ORDER — HEPARIN (PORCINE) IN NACL 2-0.9 UNIT/ML-% IJ SOLN
INTRAMUSCULAR | Status: DC | PRN
  Administered 2015-09-13: 08:00:00

## 2015-09-13 MED ORDER — IODIXANOL 320 MG/ML IV SOLN
INTRAVENOUS | Status: DC | PRN
  Administered 2015-09-13: 40 mL via INTRAVENOUS

## 2015-09-13 MED ORDER — HEPARIN (PORCINE) IN NACL 2-0.9 UNIT/ML-% IJ SOLN
INTRAMUSCULAR | Status: AC
  Filled 2015-09-13: qty 500

## 2015-09-13 MED ORDER — SODIUM CHLORIDE 0.9 % IJ SOLN: 3.0000 mL | INTRAMUSCULAR | Status: DC | PRN

## 2015-09-13 SURGICAL SUPPLY — 4 items
HOVERMATT SINGLE USE (MISCELLANEOUS) ×2 IMPLANT
STOPCOCK MORSE 400PSI 3WAY (MISCELLANEOUS) ×2 IMPLANT
TRANSDUCER W/STOPCOCK (MISCELLANEOUS) ×2 IMPLANT
TUBING CIL FLEX 10 FLL-RA (TUBING) ×2 IMPLANT

## 2015-09-13 NOTE — Telephone Encounter (Signed)
-----   Message from Phillips Odorarol S Pullins, RN sent at 09/13/2015 12:04 PM EST ----- Regarding: needs f/u with Dr. Imogene Burnhen next 2 weeks to discuss Venogram/ perm access options   ----- Message -----    From: Sherren Kernsharles E Fields, MD    Sent: 09/13/2015   8:02 AM      To: [facility] Charge Pool  Bilateral central venogram Follow up Anell Barrhen  Charles Fields

## 2015-09-13 NOTE — Discharge Instructions (Signed)
Venogram, Care After °Refer to this sheet in the next few weeks. These instructions provide you with information on caring for yourself after your procedure. Your health care provider may also give you more specific instructions. Your treatment has been planned according to current medical practices, but problems sometimes occur. Call your health care provider if you have any problems or questions after your procedure. °WHAT TO EXPECT AFTER THE PROCEDURE °After your procedure, it is typical to have the following sensations: °· Mild discomfort at the catheter insertion site. °HOME CARE INSTRUCTIONS  °· Take all medicines exactly as directed. °· Follow any prescribed diet. °· Follow instructions regarding both rest and physical activity. °· Drink more fluids for the first several days after the procedure in order to help flush dye from your kidneys. °SEEK MEDICAL CARE IF: °· You develop a rash. °· You have fever not controlled by medicine. °SEEK IMMEDIATE MEDICAL CARE IF: °· There is pain, drainage, bleeding, redness, swelling, warmth or a red streak at the site of the IV tube. °· The extremity where your IV tube was placed becomes discolored, numb, or cool. °· You have difficulty breathing or shortness of breath. °· You develop chest pain. °· You have excessive dizziness or fainting. °  °This information is not intended to replace advice given to you by your health care provider. Make sure you discuss any questions you have with your health care provider. °  °Document Released: 07/12/2013 Document Revised: 09/26/2013 Document Reviewed: 07/12/2013 °Elsevier Interactive Patient Education ©2016 [employer] ° °

## 2015-09-13 NOTE — Interval H&P Note (Signed)
History and Physical Interval Note:  09/13/2015 8:02 AM  Danise Minaeginald D Hutsell  has presented today for surgery, with the diagnosis of ESRD  The various methods of treatment have been discussed with the patient and family. After consideration of risks, benefits and other options for treatment, the patient has consented to  Procedure(s): Upper Extremity Venography (Bilateral) as a surgical intervention .  The patient's history has been reviewed, patient examined, no change in status, stable for surgery.  I have reviewed the patient's chart and labs.  Questions were answered to the patient's satisfaction.     Fabienne BrunsFields, Charles

## 2015-09-13 NOTE — Telephone Encounter (Signed)
Spoke with pt to schedule, dpm  He will call if he has any problems before this appointment, dpm

## 2015-09-13 NOTE — Op Note (Signed)
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 The patient was placed in supine position on the Angio table. Contrast was injected 0 peripheral IV in the right hand. The KUB patient's large size contrast opacification was fairly delayed but we were able to visualize a large number of big collaterals around the right chest wall and no visualization of the right subclavian or innominate vein. Attention was then turned to the left hand. Contrast was infused through the left peripheral IV there was filling immediately of the left cephalic vein with normal anatomic filling of the left subclavian vein and then delayed filling of the axillary vein which also filled the subclavian vein.  The patient tolerated the procedure well and there were complications. The patient was taken back to the holding area in stable condition.  Operative management: Dr. Imogene Burnhen we'll reevaluate the patient and determine whether or not his next access procedure would be in the left arm.  Shane Brunsharles Fields, MD [facility] [place] Office: [phone] Pager: [phone]

## 2015-09-13 NOTE — H&P (View-Only) (Signed)
Established Dialysis Access  History of Present Illness  Shane Hamilton is a 49 y.o. (07/21/1966) male who presents for re-evaluation for permanent access.  The patient is right hand dominant.  Previous access procedures have been completed in both arms.  The patient's complication from previous access procedures include: thrombosis and infection.  Her most recent Left arm access was removed for infection reportedly .  The patient has never had a previous PPM placed.  Past Medical History  Diagnosis Date  . Type 2 diabetes mellitus, uncontrolled (HCC)        . Morbid obesity (HCC)   . OSA (obstructive sleep apnea)   . Septic shock(785.52)   . Anemia   . Hypertension   . Seizures (HCC)   . Gout   . ESRD (end stage renal disease) (HCC)   . LOC (loss of consciousness)   . DVT (deep venous thrombosis) (HCC) 2011  . Cardiomegaly   . Chronic anticoagulation   . Chronic pancreatitis (HCC)   . Secondary hyperparathyroidism [facility](HCC)     Past Surgical History  Procedure Laterality Date  . Av fistula placement    . Amputation finger / thumb Right   . I&d extremity Left 06/20/2015    Procedure: IRRIGATION AND DEBRIDEMENT EXTREMITY;  Surgeon: Renford DillsGregory G Schnier, MD;  Location: [facility] ORS;  Service: Vascular;  Laterality: Left;  . Application of wound vac Left 06/20/2015    Procedure: APPLICATION OF WOUND VAC;  Surgeon: Renford DillsGregory G Schnier, MD;  Location: [facility] ORS;  Service: Vascular;  Laterality: Left;  . Av fistula placement Left 06/20/2015    Procedure: ARTERIOVENOUS (AV) FISTULA  ligation , excision;  Surgeon: Renford DillsGregory G Schnier, MD;  Location: [facility] ORS;  Service: Vascular;  Laterality: Left;  . Peripheral vascular catheterization N/A 06/25/2015    Procedure: Dialysis/Perma Catheter Insertion;  Surgeon: Annice NeedyJason S Dew, MD;  Location: [facility] INVASIVE CV LAB;  Service: Cardiovascular;  Laterality: N/A;  . Insertion of dialysis catheter N/A 06/25/2015    Procedure: place a tunnelled catheter for long  term use;  Surgeon: Annice NeedyJason S Dew, MD;  Location: [facility] ORS;  Service: Vascular;  Laterality: N/A;  . Insertion of dialysis catheter Left 07/25/2015    Procedure: INSERTION OF LEFT INTERNAL JUGULAR DIALYSIS CATHETER;  Surgeon: Pryor OchoaJames D Lawson, MD;  [facility];  Service: Vascular;  Laterality: Left;  . Insertion of dialysis catheter Left 08/01/2015    Procedure: REMOVAL OF DIALYSIS CATHETER; PLACEMNET OF NEW DIALYSIS CATHETER;  Surgeon: Pryor OchoaJames D Lawson, MD;  [facility];  Service: Vascular;  Laterality: Left;    Social History   Social History  . Marital Status: Married    Spouse Name: N/A  . Number of Children: N/A  . Years of Education: N/A   Occupational History  . disabled     Social History Main Topics  . Smoking status: Never Smoker   . Smokeless tobacco: Never Used  . Alcohol Use: No  . Drug Use: No  . Sexual Activity: Not on file   Other Topics Concern  . Not on file   Social History Narrative    Family History  Problem Relation Age of Onset  . Diabetes Mother   . Hypertension Mother   . Diabetes Father   . Hypertension Father   . Hypertension Brother     Current Outpatient Prescriptions  Medication Sig Dispense Refill  . allopurinol (ZYLOPRIM) 300 MG tablet Take 300 mg by mouth 2 (two) times daily.    .Marland Kitchen  atorvastatin (LIPITOR) 10 MG tablet Take 10 mg by mouth daily.    . calcium acetate (PHOSLO) 667 MG capsule Take 2,001 mg by mouth 3 (three) times daily with meals.     . carvedilol (COREG) 3.125 MG tablet Take 1 tablet (3.125 mg total) by mouth 2 (two) times daily with a meal. 60 tablet 0  . ceFAZolin in dextrose 5 % 50 mL ivpb 2 gram IV at the end of each dialysis- for 3 weeks. 1000 mL 0  . cinacalcet (SENSIPAR) 60 MG tablet Take 60 mg by mouth daily.    . clonazePAM (KLONOPIN) 1 MG tablet Take 1 mg by mouth 2 (two) times daily.    . colchicine 0.6 MG tablet Take 0.6 mg by mouth daily.    Marland Kitchen gabapentin (NEURONTIN) 300 MG capsule Take 300 mg by mouth every  8 (eight) hours.    . hydrOXYzine (ATARAX/VISTARIL) 25 MG tablet Take 25 mg by mouth 3 (three) times daily as needed for anxiety or itching.     . insulin aspart protamine- aspart (NOVOLOG MIX 70/30) (70-30) 100 UNIT/ML injection Inject 0.1 mLs (10 Units total) into the skin 3 (three) times daily with meals. (Patient taking differently: Inject 35 Units into the skin 3 (three) times daily with meals. ) 10 mL 11  . insulin glargine (LANTUS) 100 UNIT/ML injection Inject 0.35 mLs (35 Units total) into the skin at bedtime. (Patient taking differently: Inject 70 Units into the skin at bedtime. ) 10 mL 11  . lanthanum (FOSRENOL) 1000 MG chewable tablet Chew 1,000 mg by mouth 3 (three) times daily with meals.     . multivitamin (RENA-VIT) TABS tablet Take 1 tablet by mouth daily.   3  . omeprazole (PRILOSEC) 20 MG capsule Take 20 mg by mouth 2 (two) times daily before a meal.     . warfarin (COUMADIN) 7.5 MG tablet Take 7.5 mg by mouth See admin instructions. Pt takes 7.5mg  on Tuesday, Wednesday, Thursday, Saturday and Sunday and  on Monday and Friday    . BD PEN NEEDLE NANO U/F 32G X 4 MM MISC     . LANTUS SOLOSTAR 100 UNIT/ML Solostar Pen     . NOVOLOG FLEXPEN 100 UNIT/ML FlexPen     . Oxycodone HCl 10 MG TABS Take 1 tablet (10 mg total) by mouth 2 (two) times daily as needed (pain). (Patient not taking: Reported on 09/06/2015) 20 tablet 0  . oxyCODONE-acetaminophen (ROXICET) 5-325 MG tablet Take 1 tablet by mouth every 6 (six) hours as needed. (Patient not taking: Reported on 09/06/2015) 15 tablet 0  . warfarin (COUMADIN) 10 MG tablet      No current facility-administered medications for this visit.     Allergies  Allergen Reactions  . Aspirin Other (See Comments)    Reaction:  GI upset      REVIEW OF SYSTEMS:  (Positives checked otherwise negative)  CARDIOVASCULAR:    chest pain,   chest pressure,   palpitations,   shortness of breath when laying flat,   shortness of  breath with exertion,    pain in feet when walking,   pain in feet when laying flat,  history of blood clot in veins (DVT),   history of phlebitis,   swelling in legs,   varicose veins  PULMONARY:    productive cough,   asthma,   wheezing  NEUROLOGIC:    weakness in arms  or legs,   numbness in arms or legs,   difficulty speaking or slurred speech,   temporary loss of vision in one eye,   dizziness  HEMATOLOGIC:    bleeding problems,   problems with blood clotting too easily  MUSCULOSKEL:    joint pain,  joint swelling  GASTROINTEST:    vomiting blood,   blood in stool     GENITOURINARY:    burning with urination,   blood in urine  ESRD-HD: M-T-R-S  PSYCHIATRIC:    history of major depression  INTEGUMENTARY:    rashes,   ulcers  CONSTITUTIONAL:    fever,   chills      Physical Examination  Filed Vitals:   09/06/15 1232 09/06/15 1235  BP: 160/93 173/96  Pulse: 108 86  Temp: 98.3 F (36.8 C)   Resp: 22   Height:  (1.803 m)   Weight: 434 lb (196.861 kg)   SpO2: 94%    Body mass index is 60.56 kg/(m^2).  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 male who presents with ESRD requiring hemodialysis, morbid obesity   R side access is a poor options given known R innominate vein  stenosis/occlusion  Would get B arm venogram to determine if any residual veins present in either arm  Thigh AVG would be difficulty given his severe obesity.  He is at high risk for a groin complication.  Patient wants to be scheduled on a Friday.  Will schedule with Dr. Darrick Penna on 9 DEC 16.   The patient will follow up after that study to discuss his options.   Leonides Sake, MD [facility] [place] Office: [phone] Pager: [phone]  09/06/2015, 1:03 PM

## 2015-09-14 DIAGNOSIS — N186 End stage renal disease: Secondary | ICD-10-CM | POA: Diagnosis not present

## 2015-09-14 DIAGNOSIS — D631 Anemia in chronic kidney disease: Secondary | ICD-10-CM | POA: Diagnosis not present

## 2015-09-14 DIAGNOSIS — D509 Iron deficiency anemia, unspecified: Secondary | ICD-10-CM | POA: Diagnosis not present

## 2015-09-16 DIAGNOSIS — D631 Anemia in chronic kidney disease: Secondary | ICD-10-CM | POA: Diagnosis not present

## 2015-09-16 DIAGNOSIS — N186 End stage renal disease: Secondary | ICD-10-CM | POA: Diagnosis not present

## 2015-09-16 DIAGNOSIS — D509 Iron deficiency anemia, unspecified: Secondary | ICD-10-CM | POA: Diagnosis not present

## 2015-09-17 DIAGNOSIS — D631 Anemia in chronic kidney disease: Secondary | ICD-10-CM | POA: Diagnosis not present

## 2015-09-17 DIAGNOSIS — D509 Iron deficiency anemia, unspecified: Secondary | ICD-10-CM | POA: Diagnosis not present

## 2015-09-17 DIAGNOSIS — N186 End stage renal disease: Secondary | ICD-10-CM | POA: Diagnosis not present

## 2015-09-19 DIAGNOSIS — I2782 Chronic pulmonary embolism: Secondary | ICD-10-CM | POA: Diagnosis not present

## 2015-09-19 DIAGNOSIS — Z86711 Personal history of pulmonary embolism: Secondary | ICD-10-CM | POA: Diagnosis not present

## 2015-09-19 DIAGNOSIS — D509 Iron deficiency anemia, unspecified: Secondary | ICD-10-CM | POA: Diagnosis not present

## 2015-09-19 DIAGNOSIS — D631 Anemia in chronic kidney disease: Secondary | ICD-10-CM | POA: Diagnosis not present

## 2015-09-19 DIAGNOSIS — N186 End stage renal disease: Secondary | ICD-10-CM | POA: Diagnosis not present

## 2015-09-20 ENCOUNTER — Encounter (HOSPITAL_BASED_OUTPATIENT_CLINIC_OR_DEPARTMENT_OTHER): Payer: Medicare Other

## 2015-09-21 DIAGNOSIS — D631 Anemia in chronic kidney disease: Secondary | ICD-10-CM | POA: Diagnosis not present

## 2015-09-21 DIAGNOSIS — D509 Iron deficiency anemia, unspecified: Secondary | ICD-10-CM | POA: Diagnosis not present

## 2015-09-21 DIAGNOSIS — N186 End stage renal disease: Secondary | ICD-10-CM | POA: Diagnosis not present

## 2015-09-23 DIAGNOSIS — N186 End stage renal disease: Secondary | ICD-10-CM | POA: Diagnosis not present

## 2015-09-23 DIAGNOSIS — D509 Iron deficiency anemia, unspecified: Secondary | ICD-10-CM | POA: Diagnosis not present

## 2015-09-23 DIAGNOSIS — D631 Anemia in chronic kidney disease: Secondary | ICD-10-CM | POA: Diagnosis not present

## 2015-09-24 DIAGNOSIS — D509 Iron deficiency anemia, unspecified: Secondary | ICD-10-CM | POA: Diagnosis not present

## 2015-09-24 DIAGNOSIS — N186 End stage renal disease: Secondary | ICD-10-CM | POA: Diagnosis not present

## 2015-09-24 DIAGNOSIS — D631 Anemia in chronic kidney disease: Secondary | ICD-10-CM | POA: Diagnosis not present

## 2015-09-26 DIAGNOSIS — D631 Anemia in chronic kidney disease: Secondary | ICD-10-CM | POA: Diagnosis not present

## 2015-09-26 DIAGNOSIS — D509 Iron deficiency anemia, unspecified: Secondary | ICD-10-CM | POA: Diagnosis not present

## 2015-09-26 DIAGNOSIS — N186 End stage renal disease: Secondary | ICD-10-CM | POA: Diagnosis not present

## 2015-09-27 DIAGNOSIS — D509 Iron deficiency anemia, unspecified: Secondary | ICD-10-CM | POA: Diagnosis not present

## 2015-09-27 DIAGNOSIS — N186 End stage renal disease: Secondary | ICD-10-CM | POA: Diagnosis not present

## 2015-09-27 DIAGNOSIS — D631 Anemia in chronic kidney disease: Secondary | ICD-10-CM | POA: Diagnosis not present

## 2015-09-30 DIAGNOSIS — N186 End stage renal disease: Secondary | ICD-10-CM | POA: Diagnosis not present

## 2015-09-30 DIAGNOSIS — D509 Iron deficiency anemia, unspecified: Secondary | ICD-10-CM | POA: Diagnosis not present

## 2015-09-30 DIAGNOSIS — D631 Anemia in chronic kidney disease: Secondary | ICD-10-CM | POA: Diagnosis not present

## 2015-10-01 DIAGNOSIS — D509 Iron deficiency anemia, unspecified: Secondary | ICD-10-CM | POA: Diagnosis not present

## 2015-10-01 DIAGNOSIS — N186 End stage renal disease: Secondary | ICD-10-CM | POA: Diagnosis not present

## 2015-10-01 DIAGNOSIS — D631 Anemia in chronic kidney disease: Secondary | ICD-10-CM | POA: Diagnosis not present

## 2015-10-03 ENCOUNTER — Encounter: Payer: Self-pay | Admitting: Vascular Surgery

## 2015-10-03 DIAGNOSIS — N186 End stage renal disease: Secondary | ICD-10-CM | POA: Diagnosis not present

## 2015-10-03 DIAGNOSIS — D631 Anemia in chronic kidney disease: Secondary | ICD-10-CM | POA: Diagnosis not present

## 2015-10-03 DIAGNOSIS — D509 Iron deficiency anemia, unspecified: Secondary | ICD-10-CM | POA: Diagnosis not present

## 2015-10-04 DIAGNOSIS — D631 Anemia in chronic kidney disease: Secondary | ICD-10-CM | POA: Diagnosis not present

## 2015-10-04 DIAGNOSIS — N186 End stage renal disease: Secondary | ICD-10-CM | POA: Diagnosis not present

## 2015-10-04 DIAGNOSIS — D509 Iron deficiency anemia, unspecified: Secondary | ICD-10-CM | POA: Diagnosis not present

## 2015-10-05 DIAGNOSIS — Z992 Dependence on renal dialysis: Secondary | ICD-10-CM | POA: Diagnosis not present

## 2015-10-05 DIAGNOSIS — E1129 Type 2 diabetes mellitus with other diabetic kidney complication: Secondary | ICD-10-CM | POA: Diagnosis not present

## 2015-10-05 DIAGNOSIS — N186 End stage renal disease: Secondary | ICD-10-CM | POA: Diagnosis not present

## 2015-10-07 DIAGNOSIS — D631 Anemia in chronic kidney disease: Secondary | ICD-10-CM | POA: Diagnosis not present

## 2015-10-07 DIAGNOSIS — N186 End stage renal disease: Secondary | ICD-10-CM | POA: Diagnosis not present

## 2015-10-07 DIAGNOSIS — D509 Iron deficiency anemia, unspecified: Secondary | ICD-10-CM | POA: Diagnosis not present

## 2015-10-08 DIAGNOSIS — D509 Iron deficiency anemia, unspecified: Secondary | ICD-10-CM | POA: Diagnosis not present

## 2015-10-08 DIAGNOSIS — N186 End stage renal disease: Secondary | ICD-10-CM | POA: Diagnosis not present

## 2015-10-08 DIAGNOSIS — D631 Anemia in chronic kidney disease: Secondary | ICD-10-CM | POA: Diagnosis not present

## 2015-10-10 DIAGNOSIS — N186 End stage renal disease: Secondary | ICD-10-CM | POA: Diagnosis not present

## 2015-10-10 DIAGNOSIS — D631 Anemia in chronic kidney disease: Secondary | ICD-10-CM | POA: Diagnosis not present

## 2015-10-10 DIAGNOSIS — D509 Iron deficiency anemia, unspecified: Secondary | ICD-10-CM | POA: Diagnosis not present

## 2015-10-11 ENCOUNTER — Encounter: Payer: Self-pay | Admitting: Vascular Surgery

## 2015-10-11 ENCOUNTER — Ambulatory Visit (INDEPENDENT_AMBULATORY_CARE_PROVIDER_SITE_OTHER): Payer: Medicare Other | Admitting: Vascular Surgery

## 2015-10-11 VITALS — BP 154/89 | HR 103 | Temp 98.3°F | Resp 20 | Ht 71.0 in | Wt >= 6400 oz

## 2015-10-11 DIAGNOSIS — N186 End stage renal disease: Secondary | ICD-10-CM | POA: Diagnosis not present

## 2015-10-11 DIAGNOSIS — I70219 Atherosclerosis of native arteries of extremities with intermittent claudication, unspecified extremity: Secondary | ICD-10-CM | POA: Insufficient documentation

## 2015-10-11 DIAGNOSIS — I70213 Atherosclerosis of native arteries of extremities with intermittent claudication, bilateral legs: Secondary | ICD-10-CM

## 2015-10-11 NOTE — Addendum Note (Signed)
Addended by: Fransisco HertzHEN, Desteny Freeman L on: 10/11/2015 01:39 PM   Modules accepted: Level of Service

## 2015-10-11 NOTE — Progress Notes (Signed)
Established Dialysis Access  History of Present Illness  Shane Hamilton is a 50 y.o. ([birth year]) male who presents for re-evaluation for permanent access.  The patient is right hand dominant.  Previous access procedures have been completed in both arms.  The patient's complication from previous access procedures include: thrombosis and infection.  The patient has never had a previous PPM placed.  He has HD via a LIJV TDC currently.  He recently had a B Venogram on 09/13/15: occluded R central venous system, patent L central venous system.  The patient's PMH, PSH, SH, and FamHx are unchanged from 09/13/15 .  Current Outpatient Prescriptions  Medication Sig Dispense Refill  . allopurinol (ZYLOPRIM) 300 MG tablet Take 300 mg by mouth 2 (two) times daily.    Marland Kitchen atorvastatin (LIPITOR) 10 MG tablet Take 10 mg by mouth daily.    . BD PEN NEEDLE NANO U/F 32G X 4 MM MISC     . calcium acetate (PHOSLO) 667 MG capsule Take 2,001 mg by mouth 3 (three) times daily with meals.     . carvedilol (COREG) 3.125 MG tablet Take 1 tablet (3.125 mg total) by mouth 2 (two) times daily with a meal. 60 tablet 0  . ceFAZolin in dextrose 5 % 50 mL ivpb 2 gram IV at the end of each dialysis- for 3 weeks. 1000 mL 0  . cinacalcet (SENSIPAR) 60 MG tablet Take 60 mg by mouth daily.    . clonazePAM (KLONOPIN) 1 MG tablet Take 1 mg by mouth 2 (two) times daily.    . colchicine 0.6 MG tablet Take 0.6 mg by mouth daily.    Marland Kitchen gabapentin (NEURONTIN) 300 MG capsule Take 300 mg by mouth every 8 (eight) hours.    . hydrOXYzine (ATARAX/VISTARIL) 25 MG tablet Take 25 mg by mouth 3 (three) times daily as needed for anxiety or itching.     . insulin aspart protamine- aspart (NOVOLOG MIX 70/30) (70-30) 100 UNIT/ML injection Inject 0.1 mLs (10 Units total) into the skin 3 (three) times daily with meals. (Patient taking differently: Inject 35 Units into the skin 3 (three) times daily with meals. ) 10 mL 11  . insulin glargine (LANTUS)  100 UNIT/ML injection Inject 0.35 mLs (35 Units total) into the skin at bedtime. (Patient taking differently: Inject 70 Units into the skin at bedtime. ) 10 mL 11  . lanthanum (FOSRENOL) 1000 MG chewable tablet Chew 1,000 mg by mouth 3 (three) times daily with meals.     . multivitamin (RENA-VIT) TABS tablet Take 1 tablet by mouth daily.   3  . omeprazole (PRILOSEC) 20 MG capsule Take 20 mg by mouth 2 (two) times daily before a meal.     . Oxycodone HCl 10 MG TABS Take 1 tablet (10 mg total) by mouth 2 (two) times daily as needed (pain). 20 tablet 0  . oxyCODONE-acetaminophen (ROXICET) 5-325 MG tablet Take 1 tablet by mouth every 6 (six) hours as needed. 15 tablet 0  . warfarin (COUMADIN) 7.5 MG tablet Take 7.5 mg by mouth See admin instructions. Pt takes 7.5mg  on Tuesday, Wednesday, Thursday, Saturday and Sunday and 10mg  on Monday and Friday    . LANTUS SOLOSTAR 100 UNIT/ML Solostar Pen     . NOVOLOG FLEXPEN 100 UNIT/ML FlexPen      No current facility-administered medications for this visit.    Allergies  Allergen Reactions  . Aspirin Other (See Comments)    Reaction:  GI upset  On ROS today: no flow rate issues on HD, healed L arm   Physical Examination  Filed Vitals:   10/11/15 1203 10/11/15 1205  BP: 158/104 154/89  Pulse: 103   Temp: 98.3 F (36.8 C)   TempSrc: Oral   Resp: 20   Height: 5\' 11"  (1.803 m)   Weight: 425 lb 0.8 oz (192.8 kg)   SpO2: 96%    Body mass index is 59.31 kg/(m^2).  General: A&O x 3, WD, morbidly obese  Pulmonary: Sym exp, good air movt, CTAB, no rales, rhonchi, & wheezing  Cardiac: RRR, Nl S1, S2, no Murmurs, rubs or gallops  Vascular: Vessel Right Left  Radial Faintly Palpable Faintly Palpable  Ulnar Not Palpable Not Palpable  Brachial Palpable Palpable   Gastrointestinal: soft, NTND, no G/R, bo HSM, no masses, no CVAT B  Musculoskeletal: M/S 5/5 throughout , Extremities without  ischemic changes , no palpable thrill in access in  LUA, no bruit in access, healing L arm incisions  Neurologic: Pain and light touch intact in extremities , Motor exam as listed above  09/13/15 BUE Venogram  I review his venogram.  There is obvious right side central venous occlusion.  The left side images have not been loaded.  Medical Decision Making  Shane Hamilton is a 50 y.o. male who presents with ESRD requiring hemodialysis, morbid obesity   Based on vein mapping and examination, this patient's permanent access options include: L axilla exploration, possible hybrid AVG placement in LUA AVG position.  I have discussed with this morbidly obese patient that he is at high risk of wound infection in the event of proceeding with a thigh AVG.  I had an extensive discussion with this patient in regards to the nature of access surgery, including risk, benefits, and alternatives.    The patient is aware that the risks of access surgery include but are not limited to: bleeding, infection, steal syndrome, nerve damage, ischemic monomelic neuropathy, failure of access to mature, and possible need for additional access procedures in the future.  The patient has NOT agreed to proceed with the above procedure.  He will contact us when he is ready to proceed.   Shane SakeBrian Yakima Kreitzer, MD [facility] Office: [phone] Pager: [phone]  10/11/2015, 1:29 PM

## 2015-10-12 DIAGNOSIS — D509 Iron deficiency anemia, unspecified: Secondary | ICD-10-CM | POA: Diagnosis not present

## 2015-10-12 DIAGNOSIS — D631 Anemia in chronic kidney disease: Secondary | ICD-10-CM | POA: Diagnosis not present

## 2015-10-12 DIAGNOSIS — N186 End stage renal disease: Secondary | ICD-10-CM | POA: Diagnosis not present

## 2015-10-14 DIAGNOSIS — D509 Iron deficiency anemia, unspecified: Secondary | ICD-10-CM | POA: Diagnosis not present

## 2015-10-14 DIAGNOSIS — N186 End stage renal disease: Secondary | ICD-10-CM | POA: Diagnosis not present

## 2015-10-14 DIAGNOSIS — D631 Anemia in chronic kidney disease: Secondary | ICD-10-CM | POA: Diagnosis not present

## 2015-10-15 DIAGNOSIS — D631 Anemia in chronic kidney disease: Secondary | ICD-10-CM | POA: Diagnosis not present

## 2015-10-15 DIAGNOSIS — D509 Iron deficiency anemia, unspecified: Secondary | ICD-10-CM | POA: Diagnosis not present

## 2015-10-15 DIAGNOSIS — N186 End stage renal disease: Secondary | ICD-10-CM | POA: Diagnosis not present

## 2015-10-17 DIAGNOSIS — N186 End stage renal disease: Secondary | ICD-10-CM | POA: Diagnosis not present

## 2015-10-17 DIAGNOSIS — D631 Anemia in chronic kidney disease: Secondary | ICD-10-CM | POA: Diagnosis not present

## 2015-10-17 DIAGNOSIS — D509 Iron deficiency anemia, unspecified: Secondary | ICD-10-CM | POA: Diagnosis not present

## 2015-10-19 DIAGNOSIS — D509 Iron deficiency anemia, unspecified: Secondary | ICD-10-CM | POA: Diagnosis not present

## 2015-10-19 DIAGNOSIS — D631 Anemia in chronic kidney disease: Secondary | ICD-10-CM | POA: Diagnosis not present

## 2015-10-19 DIAGNOSIS — N186 End stage renal disease: Secondary | ICD-10-CM | POA: Diagnosis not present

## 2015-10-21 DIAGNOSIS — D509 Iron deficiency anemia, unspecified: Secondary | ICD-10-CM | POA: Diagnosis not present

## 2015-10-21 DIAGNOSIS — D631 Anemia in chronic kidney disease: Secondary | ICD-10-CM | POA: Diagnosis not present

## 2015-10-21 DIAGNOSIS — N186 End stage renal disease: Secondary | ICD-10-CM | POA: Diagnosis not present

## 2015-10-22 DIAGNOSIS — N186 End stage renal disease: Secondary | ICD-10-CM | POA: Diagnosis not present

## 2015-10-22 DIAGNOSIS — D509 Iron deficiency anemia, unspecified: Secondary | ICD-10-CM | POA: Diagnosis not present

## 2015-10-22 DIAGNOSIS — D631 Anemia in chronic kidney disease: Secondary | ICD-10-CM | POA: Diagnosis not present

## 2015-10-24 DIAGNOSIS — N186 End stage renal disease: Secondary | ICD-10-CM | POA: Diagnosis not present

## 2015-10-24 DIAGNOSIS — D509 Iron deficiency anemia, unspecified: Secondary | ICD-10-CM | POA: Diagnosis not present

## 2015-10-24 DIAGNOSIS — E1129 Type 2 diabetes mellitus with other diabetic kidney complication: Secondary | ICD-10-CM | POA: Diagnosis not present

## 2015-10-26 DIAGNOSIS — D509 Iron deficiency anemia, unspecified: Secondary | ICD-10-CM | POA: Diagnosis not present

## 2015-10-26 DIAGNOSIS — E1129 Type 2 diabetes mellitus with other diabetic kidney complication: Secondary | ICD-10-CM | POA: Diagnosis not present

## 2015-10-26 DIAGNOSIS — N186 End stage renal disease: Secondary | ICD-10-CM | POA: Diagnosis not present

## 2015-10-29 DIAGNOSIS — D509 Iron deficiency anemia, unspecified: Secondary | ICD-10-CM | POA: Diagnosis not present

## 2015-10-29 DIAGNOSIS — E1129 Type 2 diabetes mellitus with other diabetic kidney complication: Secondary | ICD-10-CM | POA: Diagnosis not present

## 2015-10-29 DIAGNOSIS — N186 End stage renal disease: Secondary | ICD-10-CM | POA: Diagnosis not present

## 2015-10-31 DIAGNOSIS — D509 Iron deficiency anemia, unspecified: Secondary | ICD-10-CM | POA: Diagnosis not present

## 2015-10-31 DIAGNOSIS — N186 End stage renal disease: Secondary | ICD-10-CM | POA: Diagnosis not present

## 2015-10-31 DIAGNOSIS — E1129 Type 2 diabetes mellitus with other diabetic kidney complication: Secondary | ICD-10-CM | POA: Diagnosis not present

## 2015-11-02 DIAGNOSIS — E1129 Type 2 diabetes mellitus with other diabetic kidney complication: Secondary | ICD-10-CM | POA: Diagnosis not present

## 2015-11-02 DIAGNOSIS — D509 Iron deficiency anemia, unspecified: Secondary | ICD-10-CM | POA: Diagnosis not present

## 2015-11-02 DIAGNOSIS — N186 End stage renal disease: Secondary | ICD-10-CM | POA: Diagnosis not present

## 2015-11-05 DIAGNOSIS — D509 Iron deficiency anemia, unspecified: Secondary | ICD-10-CM | POA: Diagnosis not present

## 2015-11-05 DIAGNOSIS — E1129 Type 2 diabetes mellitus with other diabetic kidney complication: Secondary | ICD-10-CM | POA: Diagnosis not present

## 2015-11-05 DIAGNOSIS — Z992 Dependence on renal dialysis: Secondary | ICD-10-CM | POA: Diagnosis not present

## 2015-11-05 DIAGNOSIS — I82599 Chronic embolism and thrombosis of other specified deep vein of unspecified lower extremity: Secondary | ICD-10-CM | POA: Diagnosis not present

## 2015-11-05 DIAGNOSIS — N186 End stage renal disease: Secondary | ICD-10-CM | POA: Diagnosis not present

## 2015-11-07 DIAGNOSIS — T8249XD Other complication of vascular dialysis catheter, subsequent encounter: Secondary | ICD-10-CM | POA: Diagnosis not present

## 2015-11-07 DIAGNOSIS — I82599 Chronic embolism and thrombosis of other specified deep vein of unspecified lower extremity: Secondary | ICD-10-CM | POA: Diagnosis not present

## 2015-11-07 DIAGNOSIS — D631 Anemia in chronic kidney disease: Secondary | ICD-10-CM | POA: Diagnosis not present

## 2015-11-07 DIAGNOSIS — D509 Iron deficiency anemia, unspecified: Secondary | ICD-10-CM | POA: Diagnosis not present

## 2015-11-07 DIAGNOSIS — N186 End stage renal disease: Secondary | ICD-10-CM | POA: Diagnosis not present

## 2015-11-08 ENCOUNTER — Other Ambulatory Visit: Payer: Self-pay

## 2015-11-09 DIAGNOSIS — D631 Anemia in chronic kidney disease: Secondary | ICD-10-CM | POA: Diagnosis not present

## 2015-11-09 DIAGNOSIS — T8249XD Other complication of vascular dialysis catheter, subsequent encounter: Secondary | ICD-10-CM | POA: Diagnosis not present

## 2015-11-09 DIAGNOSIS — N186 End stage renal disease: Secondary | ICD-10-CM | POA: Diagnosis not present

## 2015-11-09 DIAGNOSIS — D509 Iron deficiency anemia, unspecified: Secondary | ICD-10-CM | POA: Diagnosis not present

## 2015-11-11 ENCOUNTER — Encounter (HOSPITAL_COMMUNITY): Payer: Self-pay | Admitting: *Deleted

## 2015-11-11 NOTE — Progress Notes (Signed)
Pt denies cardiac history, chest pain or sob. Pt is diabetic and also has sleep apnea, states he never got a Cpap even though he was told he needed one. Pt instructed to take 56 units of Lantus Insulin Tuesday night and no insulin the morning of surgery. Instructed pt to check his blood sugar morning of surgery and if blood sugar is 70 or less, he needs to treat it with a 1/2 cup (4 oz) of clear juice (cranberry or apple). Instructed him to recheck his blood sugar 15 minutes after drinking the juice. If still 70 or below, I gave him the number to surgical Short Stay and instructed him to call and ask for a nurse. Pt voiced understanding. These instructions were given per Diabetes Medication Adjustment Guidelines Prior to Procedure and Surgery.

## 2015-11-12 ENCOUNTER — Other Ambulatory Visit: Payer: Self-pay

## 2015-11-12 DIAGNOSIS — D509 Iron deficiency anemia, unspecified: Secondary | ICD-10-CM | POA: Diagnosis not present

## 2015-11-12 DIAGNOSIS — T8249XD Other complication of vascular dialysis catheter, subsequent encounter: Secondary | ICD-10-CM | POA: Diagnosis not present

## 2015-11-12 DIAGNOSIS — N186 End stage renal disease: Secondary | ICD-10-CM | POA: Diagnosis not present

## 2015-11-12 DIAGNOSIS — D631 Anemia in chronic kidney disease: Secondary | ICD-10-CM | POA: Diagnosis not present

## 2015-11-12 MED ORDER — SODIUM CHLORIDE 0.9 % IV SOLN
INTRAVENOUS | Status: DC
  Administered 2015-11-13 (×2): via INTRAVENOUS

## 2015-11-12 MED ORDER — DEXTROSE 5 % IV SOLN
1.5000 g | INTRAVENOUS | Status: AC
  Administered 2015-11-13: 1.5 g via INTRAVENOUS
  Filled 2015-11-12: qty 1.5

## 2015-11-12 MED ORDER — CHLORHEXIDINE GLUCONATE CLOTH 2 % EX PADS: 6.0000 | MEDICATED_PAD | Freq: Once | CUTANEOUS | Status: DC

## 2015-11-13 ENCOUNTER — Encounter (HOSPITAL_COMMUNITY)
Admission: RE | Disposition: A | Discharge status: Home / Self Care | Payer: Self-pay | Source: Ambulatory Visit | Attending: Vascular Surgery

## 2015-11-13 ENCOUNTER — Ambulatory Visit (HOSPITAL_COMMUNITY): Payer: Medicare Other | Admitting: Anesthesiology

## 2015-11-13 ENCOUNTER — Ambulatory Visit (HOSPITAL_COMMUNITY): Payer: Medicare Other

## 2015-11-13 ENCOUNTER — Ambulatory Visit (HOSPITAL_COMMUNITY)
Admission: RE | Admit: 2015-11-13 | Discharge: 2015-11-13 | Disposition: A | Discharge status: Home / Self Care | Payer: Medicare Other | Source: Ambulatory Visit | Attending: Vascular Surgery | Admitting: Vascular Surgery

## 2015-11-13 DIAGNOSIS — Z992 Dependence on renal dialysis: Secondary | ICD-10-CM | POA: Insufficient documentation

## 2015-11-13 DIAGNOSIS — N2581 Secondary hyperparathyroidism of renal origin: Secondary | ICD-10-CM | POA: Diagnosis not present

## 2015-11-13 DIAGNOSIS — K219 Gastro-esophageal reflux disease without esophagitis: Secondary | ICD-10-CM | POA: Diagnosis not present

## 2015-11-13 DIAGNOSIS — E1122 Type 2 diabetes mellitus with diabetic chronic kidney disease: Secondary | ICD-10-CM | POA: Insufficient documentation

## 2015-11-13 DIAGNOSIS — T82590A Other mechanical complication of surgically created arteriovenous fistula, initial encounter: Secondary | ICD-10-CM | POA: Insufficient documentation

## 2015-11-13 DIAGNOSIS — Y832 Surgical operation with anastomosis, bypass or graft as the cause of abnormal reaction of the patient, or of later complication, without mention of misadventure at the time of the procedure: Secondary | ICD-10-CM | POA: Diagnosis not present

## 2015-11-13 DIAGNOSIS — N186 End stage renal disease: Secondary | ICD-10-CM | POA: Insufficient documentation

## 2015-11-13 DIAGNOSIS — T8241XA Breakdown (mechanical) of vascular dialysis catheter, initial encounter: Secondary | ICD-10-CM | POA: Diagnosis not present

## 2015-11-13 DIAGNOSIS — T82898A Other specified complication of vascular prosthetic devices, implants and grafts, initial encounter: Secondary | ICD-10-CM | POA: Diagnosis not present

## 2015-11-13 DIAGNOSIS — I120 Hypertensive chronic kidney disease with stage 5 chronic kidney disease or end stage renal disease: Secondary | ICD-10-CM | POA: Diagnosis not present

## 2015-11-13 DIAGNOSIS — I129 Hypertensive chronic kidney disease with stage 1 through stage 4 chronic kidney disease, or unspecified chronic kidney disease: Secondary | ICD-10-CM | POA: Insufficient documentation

## 2015-11-13 DIAGNOSIS — Z452 Encounter for adjustment and management of vascular access device: Secondary | ICD-10-CM | POA: Diagnosis not present

## 2015-11-13 DIAGNOSIS — Z86718 Personal history of other venous thrombosis and embolism: Secondary | ICD-10-CM | POA: Insufficient documentation

## 2015-11-13 HISTORY — DX: Headache, unspecified: R51.9

## 2015-11-13 HISTORY — PX: EXCHANGE OF A DIALYSIS CATHETER: SHX5818

## 2015-11-13 HISTORY — DX: Gastro-esophageal reflux disease without esophagitis: K21.9

## 2015-11-13 HISTORY — DX: Pneumonia, unspecified organism: J18.9

## 2015-11-13 HISTORY — DX: Headache: R51

## 2015-11-13 HISTORY — DX: Calculus of kidney: N20.0

## 2015-11-13 LAB — GLUCOSE, CAPILLARY
GLUCOSE-CAPILLARY: 127 mg/dL — AB (ref 65–99)
Glucose-Capillary: 116 mg/dL — ABNORMAL HIGH (ref 65–99)

## 2015-11-13 LAB — PROTIME-INR
INR: 1.11 (ref 0.00–1.49)
PROTHROMBIN TIME: 14.5 s (ref 11.6–15.2)

## 2015-11-13 LAB — POCT I-STAT 4, (NA,K, GLUC, HGB,HCT)
Glucose, Bld: 151 mg/dL — ABNORMAL HIGH (ref 65–99)
HCT: 35 % — ABNORMAL LOW (ref 39.0–52.0)
HEMOGLOBIN: 11.9 g/dL — AB (ref 13.0–17.0)
Potassium: 4.4 mmol/L (ref 3.5–5.1)
Sodium: 138 mmol/L (ref 135–145)

## 2015-11-13 SURGERY — EXCHANGE OF A DIALYSIS CATHETER
Anesthesia: General | Site: Neck | Laterality: Left

## 2015-11-13 MED ORDER — PROPOFOL 10 MG/ML IV BOLUS
INTRAVENOUS | Status: AC
  Filled 2015-11-13: qty 40

## 2015-11-13 MED ORDER — HEPARIN SODIUM (PORCINE) 1000 UNIT/ML IJ SOLN
INTRAMUSCULAR | Status: AC
  Filled 2015-11-13: qty 1

## 2015-11-13 MED ORDER — SODIUM CHLORIDE 0.9 % IV SOLN
INTRAVENOUS | Status: DC | PRN
  Administered 2015-11-13: 500 mL

## 2015-11-13 MED ORDER — SUCCINYLCHOLINE CHLORIDE 20 MG/ML IJ SOLN
INTRAMUSCULAR | Status: AC
  Filled 2015-11-13: qty 1

## 2015-11-13 MED ORDER — PHENYLEPHRINE HCL 10 MG/ML IJ SOLN
10.0000 mg | INTRAVENOUS | Status: DC | PRN
  Administered 2015-11-13 (×2): 40 ug/min via INTRAVENOUS

## 2015-11-13 MED ORDER — OXYCODONE HCL 5 MG PO TABS
ORAL_TABLET | ORAL | Status: AC
  Administered 2015-11-13: 5 mg via ORAL
  Filled 2015-11-13: qty 1

## 2015-11-13 MED ORDER — OXYCODONE HCL 5 MG/5ML PO SOLN: 5.0000 mg | Freq: Once | ORAL | Status: AC | PRN

## 2015-11-13 MED ORDER — HEPARIN SODIUM (PORCINE) 1000 UNIT/ML IJ SOLN
INTRAMUSCULAR | Status: DC | PRN
  Administered 2015-11-13: 3.8 mL via INTRAVENOUS

## 2015-11-13 MED ORDER — GLYCOPYRROLATE 0.2 MG/ML IJ SOLN
INTRAMUSCULAR | Status: AC
  Filled 2015-11-13: qty 4

## 2015-11-13 MED ORDER — NEOSTIGMINE METHYLSULFATE 10 MG/10ML IV SOLN
INTRAVENOUS | Status: AC
  Filled 2015-11-13: qty 1

## 2015-11-13 MED ORDER — MIDAZOLAM HCL 2 MG/2ML IJ SOLN
INTRAMUSCULAR | Status: AC
  Filled 2015-11-13: qty 2

## 2015-11-13 MED ORDER — OXYCODONE HCL 5 MG PO TABS
5.0000 mg | ORAL_TABLET | Freq: Once | ORAL | Status: AC | PRN
  Administered 2015-11-13: 5 mg via ORAL

## 2015-11-13 MED ORDER — PROPOFOL 10 MG/ML IV BOLUS
INTRAVENOUS | Status: DC | PRN
  Administered 2015-11-13: 140 mg via INTRAVENOUS

## 2015-11-13 MED ORDER — MIDAZOLAM HCL 5 MG/5ML IJ SOLN
INTRAMUSCULAR | Status: DC | PRN
  Administered 2015-11-13: 1 mg via INTRAVENOUS

## 2015-11-13 MED ORDER — FENTANYL CITRATE (PF) 100 MCG/2ML IJ SOLN: 25.0000 ug | INTRAMUSCULAR | Status: DC | PRN

## 2015-11-13 MED ORDER — ONDANSETRON HCL 4 MG/2ML IJ SOLN: 4.0000 mg | Freq: Once | INTRAMUSCULAR | Status: DC | PRN

## 2015-11-13 MED ORDER — LIDOCAINE-EPINEPHRINE (PF) 1 %-1:200000 IJ SOLN
INTRAMUSCULAR | Status: AC
  Filled 2015-11-13: qty 30

## 2015-11-13 MED ORDER — SUCCINYLCHOLINE CHLORIDE 20 MG/ML IJ SOLN
INTRAMUSCULAR | Status: DC | PRN
  Administered 2015-11-13: 140 mg via INTRAVENOUS

## 2015-11-13 MED ORDER — LIDOCAINE HCL (CARDIAC) 20 MG/ML IV SOLN
INTRAVENOUS | Status: DC | PRN
  Administered 2015-11-13: 50 mg via INTRAVENOUS

## 2015-11-13 MED ORDER — ROCURONIUM BROMIDE 100 MG/10ML IV SOLN
INTRAVENOUS | Status: DC | PRN
  Administered 2015-11-13: 35 mg via INTRAVENOUS

## 2015-11-13 MED ORDER — OXYCODONE-ACETAMINOPHEN 5-325 MG PO TABS: 1.0000 | ORAL_TABLET | Freq: Four times a day (QID) | ORAL | Status: DC | PRN

## 2015-11-13 MED ORDER — NEOSTIGMINE METHYLSULFATE 10 MG/10ML IV SOLN
INTRAVENOUS | Status: DC | PRN
  Administered 2015-11-13 (×2): 2.5 mg via INTRAVENOUS

## 2015-11-13 MED ORDER — GLYCOPYRROLATE 0.2 MG/ML IJ SOLN
INTRAMUSCULAR | Status: DC | PRN
  Administered 2015-11-13 (×2): 0.4 mg via INTRAVENOUS

## 2015-11-13 MED ORDER — ROCURONIUM BROMIDE 50 MG/5ML IV SOLN
INTRAVENOUS | Status: AC
  Filled 2015-11-13: qty 1

## 2015-11-13 MED ORDER — FENTANYL CITRATE (PF) 100 MCG/2ML IJ SOLN
INTRAMUSCULAR | Status: DC | PRN
  Administered 2015-11-13 (×2): 75 ug via INTRAVENOUS

## 2015-11-13 MED ORDER — FENTANYL CITRATE (PF) 250 MCG/5ML IJ SOLN
INTRAMUSCULAR | Status: AC
  Filled 2015-11-13: qty 5

## 2015-11-13 MED ORDER — LIDOCAINE HCL (CARDIAC) 20 MG/ML IV SOLN
INTRAVENOUS | Status: AC
  Filled 2015-11-13: qty 5

## 2015-11-13 SURGICAL SUPPLY — 40 items
BAG DECANTER FOR FLEXI CONT (MISCELLANEOUS) ×3 IMPLANT
BIOPATCH RED 1 DISK 7.0 (GAUZE/BANDAGES/DRESSINGS) ×2 IMPLANT
BIOPATCH RED 1IN DISK 7.0MM (GAUZE/BANDAGES/DRESSINGS) ×1
CATH PALINDROME RT-P 15FX19CM (CATHETERS) IMPLANT
CATH PALINDROME RT-P 15FX23CM (CATHETERS) IMPLANT
CATH PALINDROME RT-P 15FX28CM (CATHETERS) ×3 IMPLANT
CATH PALINDROME RT-P 15FX55CM (CATHETERS) IMPLANT
COVER DOME SNAP 22 D (MISCELLANEOUS) ×3 IMPLANT
COVER LIGHT HANDLE STERIS (MISCELLANEOUS) ×3 IMPLANT
COVER PROBE W GEL 5X96 (DRAPES) ×3 IMPLANT
DRAPE C-ARM 42X72 X-RAY (DRAPES) IMPLANT
DRAPE CHEST BREAST 15X10 FENES (DRAPES) ×3 IMPLANT
GAUZE SPONGE 2X2 8PLY STRL LF (GAUZE/BANDAGES/DRESSINGS) ×1 IMPLANT
GAUZE SPONGE 4X4 16PLY XRAY LF (GAUZE/BANDAGES/DRESSINGS) ×3 IMPLANT
GLOVE BIOGEL PI IND STRL 7.5 (GLOVE) ×1 IMPLANT
GLOVE BIOGEL PI INDICATOR 7.5 (GLOVE) ×2
GLOVE SURG SS PI 7.5 STRL IVOR (GLOVE) ×3 IMPLANT
GOWN STRL REUS W/ TWL LRG LVL3 (GOWN DISPOSABLE) IMPLANT
GOWN STRL REUS W/ TWL XL LVL3 (GOWN DISPOSABLE) ×2 IMPLANT
GOWN STRL REUS W/TWL LRG LVL3 (GOWN DISPOSABLE)
GOWN STRL REUS W/TWL XL LVL3 (GOWN DISPOSABLE) ×4
KIT BASIN OR (CUSTOM PROCEDURE TRAY) ×3 IMPLANT
KIT ROOM TURNOVER OR (KITS) ×3 IMPLANT
LIQUID BAND (GAUZE/BANDAGES/DRESSINGS) ×3 IMPLANT
NEEDLE 18GX1X1/2 (RX/OR ONLY) (NEEDLE) ×3 IMPLANT
NEEDLE HYPO 25GX1X1/2 BEV (NEEDLE) ×3 IMPLANT
NS IRRIG 1000ML POUR BTL (IV SOLUTION) ×3 IMPLANT
PACK SURGICAL SETUP 50X90 (CUSTOM PROCEDURE TRAY) ×3 IMPLANT
PAD ARMBOARD 7.5X6 YLW CONV (MISCELLANEOUS) ×6 IMPLANT
SOAP 2 % CHG 4 OZ (WOUND CARE) ×3 IMPLANT
SPONGE GAUZE 2X2 STER 10/PKG (GAUZE/BANDAGES/DRESSINGS) ×2
SUT ETHILON 3 0 PS 1 (SUTURE) ×3 IMPLANT
SUT VICRYL 4-0 PS2 18IN ABS (SUTURE) ×3 IMPLANT
SYR 20CC LL (SYRINGE) ×6 IMPLANT
SYR 30ML LL (SYRINGE) IMPLANT
SYR 5ML LL (SYRINGE) ×3 IMPLANT
SYR CONTROL 10ML LL (SYRINGE) ×3 IMPLANT
SYRINGE 10CC LL (SYRINGE) ×3 IMPLANT
TAPE CLOTH SURG 4X10 WHT LF (GAUZE/BANDAGES/DRESSINGS) ×3 IMPLANT
WATER STERILE IRR 1000ML POUR (IV SOLUTION) IMPLANT

## 2015-11-13 NOTE — Anesthesia Preprocedure Evaluation (Addendum)
Anesthesia Evaluation  Patient identified by MRN, date of birth, ID band Patient awake    Reviewed: Allergy & Precautions, NPO status , Patient's Chart, lab work & pertinent test results, reviewed documented beta blocker date and time   Airway Mallampati: III  TM Distance: >3 FB Neck ROM: Limited    Dental  (+) Teeth Intact, Dental Advisory Given   Pulmonary  Does not use his CPAP   breath sounds clear to auscultation       Cardiovascular hypertension, Pt. on medications and Pt. on home beta blockers  Rhythm:Regular Rate:Normal  Coreg hs 11/11/2014   Neuro/Psych Seizures -, Well Controlled,     GI/Hepatic   Endo/Other  diabetes, Poorly Controlled, Type 2  Renal/GU CRF, ESRF and DialysisRenal diseaseHD 11/11/2014     Musculoskeletal   Abdominal   Peds  Hematology   Anesthesia Other Findings   Reproductive/Obstetrics                           Anesthesia Physical Anesthesia Plan  ASA: III  Anesthesia Plan: General   Post-op Pain Management:    Induction: Intravenous  Airway Management Planned: Oral ETT  Additional Equipment:   Intra-op Plan:   Post-operative Plan: Extubation in OR  Informed Consent: I have reviewed the patients History and Physical, chart, labs and discussed the procedure including the risks, benefits and alternatives for the proposed anesthesia with the patient or authorized representative who has indicated his/her understanding and acceptance.     Plan Discussed with: CRNA and Anesthesiologist  Anesthesia Plan Comments: (ESRD last HD 2/7 K-4.4 Type 2 DM glucose 151 Morbid Obesity Sleep apnea noncompliance with CPAP  Plan GA with Glide scope intubation with oral ETT  Kipp Brood)        Anesthesia Quick Evaluation

## 2015-11-13 NOTE — H&P (Signed)
Patient name: Shane Hamilton MRN: [id number] DOB: 28-Jun-1966 Sex: male    No chief complaint on file.   HISTORY OF PRESENT ILLNESS: 50 yo with poor flow from left sided HD catheter.  Past Medical History  Diagnosis Date  . Type 2 diabetes mellitus, uncontrolled (HCC)        . Morbid obesity (HCC)   . OSA (obstructive sleep apnea)   . Septic shock(785.52)   . Anemia   . Hypertension   . Gout   . LOC (loss of consciousness)   . DVT (deep venous thrombosis) (HCC) 2011  . Cardiomegaly   . Chronic anticoagulation   . Chronic pancreatitis (HCC)   . Secondary hyperparathyroidism (HCC)   . Pneumonia   . ESRD (end stage renal disease) (HCC)     T/Th/Sa  . Kidney stones   . GERD (gastroesophageal reflux disease)   . Seizures (HCC)     pt denies  . Headache     migraines    Past Surgical History  Procedure Laterality Date  . Av fistula placement    . Amputation finger / thumb Right   . I&d extremity Left 06/20/2015    Procedure: IRRIGATION AND DEBRIDEMENT EXTREMITY;  Surgeon: Renford Dills, MD;  Location: [facility] ORS;  Service: Vascular;  Laterality: Left;  . Application of wound vac Left 06/20/2015    Procedure: APPLICATION OF WOUND VAC;  Surgeon: Renford Dills, MD;  Location: [facility] ORS;  Service: Vascular;  Laterality: Left;  . Av fistula placement Left 06/20/2015    Procedure: ARTERIOVENOUS (AV) FISTULA  ligation , excision;  Surgeon: Renford Dills, MD;  Location: [facility] ORS;  Service: Vascular;  Laterality: Left;  . Peripheral vascular catheterization N/A 06/25/2015    Procedure: Dialysis/Perma Catheter Insertion;  Surgeon: Annice Needy, MD;  Location: [facility] INVASIVE CV LAB;  Service: Cardiovascular;  Laterality: N/A;  . Insertion of dialysis catheter N/A 06/25/2015    Procedure: place a tunnelled catheter for long term use;  Surgeon: Annice Needy, MD;  Location: [facility] ORS;  Service: Vascular;  Laterality: N/A;  . Insertion of dialysis catheter Left 07/25/2015   Procedure: INSERTION OF LEFT INTERNAL JUGULAR DIALYSIS CATHETER;  Surgeon: Pryor Ochoa, MD;  Location: [facility] OR;  Service: Vascular;  Laterality: Left;  . Insertion of dialysis catheter Left 08/01/2015    Procedure: REMOVAL OF DIALYSIS CATHETER; PLACEMNET OF NEW DIALYSIS CATHETER;  Surgeon: Pryor Ochoa, MD;  Location: [facility] OR;  Service: Vascular;  Laterality: Left;  . Peripheral vascular catheterization Bilateral 09/13/2015    Procedure: Upper Extremity Venography;  Surgeon: Sherren Kerns, MD;  Location: [facility] INVASIVE CV LAB;  Service: Cardiovascular;  Laterality: Bilateral;    Social History   Social History  . Marital Status: Married    Spouse Name: N/A  . Number of Children: N/A  . Years of Education: N/A   Occupational History  . disabled     Social History Main Topics  . Smoking status: Never Smoker   . Smokeless tobacco: Never Used  . Alcohol Use: No  . Drug Use: No  . Sexual Activity: Not on file   Other Topics Concern  . Not on file   Social History Narrative    Family History  Problem Relation Age of Onset  . Diabetes Mother   . Hypertension Mother   . Diabetes Father   . Hypertension Father   . Hypertension Brother     Allergies as of 11/08/2015 -  Review Complete 10/11/2015  Allergen Reaction Noted  . Aspirin Other (See Comments) 08/05/2011    No current facility-administered medications on file prior to encounter.   Current Outpatient Prescriptions on File Prior to Encounter  Medication Sig Dispense Refill  . allopurinol (ZYLOPRIM) 300 MG tablet Take 300 mg by mouth 2 (two) times daily.    Marland Kitchen atorvastatin (LIPITOR) 10 MG tablet Take 10 mg by mouth daily.    . calcium acetate (PHOSLO) 667 MG capsule Take 2,001 mg by mouth 3 (three) times daily with meals.     . carvedilol (COREG) 3.125 MG tablet Take 1 tablet (3.125 mg total) by mouth 2 (two) times daily with a meal. 60 tablet 0  . cinacalcet (SENSIPAR) 60 MG tablet Take 60 mg by mouth daily.    .  clonazePAM (KLONOPIN) 1 MG tablet Take 1 mg by mouth 2 (two) times daily.    . colchicine 0.6 MG tablet Take 0.6 mg by mouth daily.    Marland Kitchen gabapentin (NEURONTIN) 300 MG capsule Take 600 mg by mouth every 8 (eight) hours.     . insulin aspart protamine- aspart (NOVOLOG MIX 70/30) (70-30) 100 UNIT/ML injection Inject 0.1 mLs (10 Units total) into the skin 3 (three) times daily with meals. (Patient taking differently: Inject 35 Units into the skin 3 (three) times daily with meals. ) 10 mL 11  . insulin glargine (LANTUS) 100 UNIT/ML injection Inject 0.35 mLs (35 Units total) into the skin at bedtime. (Patient taking differently: Inject 70 Units into the skin at bedtime. ) 10 mL 11  . lanthanum (FOSRENOL) 1000 MG chewable tablet Chew 1,000 mg by mouth 3 (three) times daily with meals.     . multivitamin (RENA-VIT) TABS tablet Take 1 tablet by mouth daily.   3  . omeprazole (PRILOSEC) 20 MG capsule Take 20 mg by mouth 2 (two) times daily before a meal.     . Oxycodone HCl 10 MG TABS Take 1 tablet (10 mg total) by mouth 2 (two) times daily as needed (pain). 20 tablet 0  . oxyCODONE-acetaminophen (ROXICET) 5-325 MG tablet Take 1 tablet by mouth every 6 (six) hours as needed. 15 tablet 0  . warfarin (COUMADIN) 7.5 MG tablet Take 7.5 mg by mouth See admin instructions. Pt takes 7.5mg  on Tuesday, Wednesday, Thursday, Saturday and Sunday and  on Monday and Friday    . BD PEN NEEDLE NANO U/F 32G X 4 MM MISC         PHYSICAL EXAMINATION:   Vital signs are  Filed Vitals:   11/13/15 0729  BP: 153/99  Pulse: 78  Temp: 97.5 F (36.4 C)  TempSrc: Oral  Resp: 20  SpO2: 100%   There is no weight on file to calculate BMI. Obese male CV RRR Pulm:  obstructive    Assessment: ESRD Plan: Discussed with patient exchanging existing HD catheter  V. Charlena Cross, M.D. [facility] [place] Office: [phone] Pager:  [phone]

## 2015-11-13 NOTE — Progress Notes (Signed)
Pt arrived to pacu with tugging respirations very sleepy jaw support needed to maintain o2 sats in low 90,s o2 sats w/o jaw support dropping to low 8o,s has to be stimulated to breath nasal airway inserted by Darcey Nora crna pt more awake and able to maintain sats at 98-100 with face mask at [facility]

## 2015-11-13 NOTE — Progress Notes (Signed)
Nasal airway removed per pts request and o2 at 2L/[place] applied pt maintaining o2 sats 96

## 2015-11-13 NOTE — Transfer of Care (Signed)
Immediate Anesthesia Transfer of Care Note  Patient: Shane Hamilton  Procedure(s) Performed: Procedure(s): EXCHANGE OF A DIALYSIS CATHETER- LEFT INTERNAL JUGULAR (Left)  Patient Location: PACU  Anesthesia Type:General  Level of Consciousness: awake and lethargic  Airway & Oxygen Therapy: Patient Spontanous Breathing and Patient connected to face mask oxygen  Post-op Assessment: Report given to RN, Post -op Vital signs reviewed and stable, Patient moving all extremities and NP tube into right nostril for partial AW obstruction  Post vital signs: Reviewed and stable  Last Vitals:  Filed Vitals:   11/13/15 0729  BP: 153/99  Pulse: 78  Temp: 36.4 C  Resp: 20    Complications: No apparent anesthesia complications

## 2015-11-13 NOTE — Progress Notes (Signed)
Rosey [place] rn resuming as caregiver

## 2015-11-13 NOTE — Progress Notes (Signed)
Spoke with dr Noreene Larsson notifed pt on ra sats maintaining 90-99 ok to move to phase 2

## 2015-11-13 NOTE — Anesthesia Procedure Notes (Signed)
Procedure Name: Intubation Date/Time: 11/13/2015 8:55 AM Performed by: Marni Griffon Pre-anesthesia Checklist: Patient identified, Emergency Drugs available, Suction available and Patient being monitored Patient Re-evaluated:Patient Re-evaluated prior to inductionOxygen Delivery Method: Circle system utilized Preoxygenation: Pre-oxygenation with 100% oxygen Intubation Type: IV induction Laryngoscope Size: Mac and Glidescope Grade View: Grade II Tube type: Oral Tube size: 7.5 mm Number of attempts: 1 Airway Equipment and Method: Stylet Placement Confirmation: ETT inserted through vocal cords under direct vision,  positive ETCO2 and breath sounds checked- equal and bilateral Secured at: 23 (cm at teeth) cm Tube secured with: Tape Dental Injury: Teeth and Oropharynx as per pre-operative assessment

## 2015-11-13 NOTE — Anesthesia Postprocedure Evaluation (Signed)
Anesthesia Post Note  Patient: Shane Hamilton  Procedure(s) Performed: Procedure(s) (LRB): EXCHANGE OF A DIALYSIS CATHETER- LEFT INTERNAL JUGULAR (Left)  Patient location during evaluation: PACU Anesthesia Type: General Level of consciousness: awake and awake and alert Pain management: pain level controlled Vital Signs Assessment: post-procedure vital signs reviewed and stable Respiratory status: spontaneous breathing and nonlabored ventilation Anesthetic complications: no    Last Vitals:  Filed Vitals:   11/13/15 1110 11/13/15 1118  BP:  151/97  Pulse: 100 100  Temp: 37.3 C   Resp: 18     Last Pain:  Filed Vitals:   11/13/15 1121  PainSc: 4                  Thomasine Klutts COKER

## 2015-11-13 NOTE — Op Note (Signed)
    Patient name: ZYDEN SUMAN MRN: [id number] DOB: 1966-05-03 Sex: male  11/13/2015 Pre-operative Diagnosis: Poorly functioning dialysis catheter Post-operative diagnosis:  Same Surgeon:  Durene Cal Assistants:  None Procedure:   #1: Removal of existing tunneled central venous catheter for dialysis   #2: Placement of a new tunneled central venous catheter oh (palindrome 28 cm) Anesthesia:  Gen. Blood Loss:  See anesthesia record Specimens:  None  Findings:  Catheter tip in the cavoatrial junction  Indications:  The patient is having difficulty with flow rates using his dialysis catheter.  He therefore comes in today for an exchange.  Procedure:  The patient was identified in the holding area and taken to [facility] OR ROOM 16  The patient was then placed supine on the table. general anesthesia was administered.  The patient was prepped and draped in the usual sterile fashion.  A time out was called and antibiotics were administered.  The supraclavicular incision in the neck was opened with a #11 blade.  Blunt dissection was used to identified the Silastic catheter.  This was grasped and clamped with a hemostat.  Next using sharp dissection I dissected out the cuff through the skin exit site.  Once the catheter was fully mobile, I transected the catheter just proximal to the cuff the distal end of the catheter was removed.  I then pulled the Silastic portion of the catheter through the subcutaneous tissue and out the incision in the neck.  A Amplatz superstiff wire was then inserted through the catheter and the catheter was removed.  A peel-away sheath was then inserted.  Over the wire a 28 cm Pallindrome catheter was inserted in the peel-away sheath was removed.  A new skin exit site was selected.  A #11 blade was used to make a skin neck.  A subcutaneous tunnel was created.  The cath was brought the saphenous tunnel the cuff situated the skin exit site.  The catheter was then connected to the port.   Both ports flushed and aspirated without difficulty.  I did have to advance a wire through one of the ports to get it to function.  There was a small thrombus/neointima evacuated here once this was done the catheter functioned properly.  Both ports were filled with heparinized saline.  The catheter was sewn into position with 3-0 nylon.  The skin incision in the neck was closed with 2 layers of 4-0 Monocryl.  Sterile dressings and Dermabond were applied   Disposition:  To PACU in stable condition.   Juleen China, M.D. [facility] [place] Office: [phone] Pager:  [phone]

## 2015-11-13 NOTE — Progress Notes (Signed)
Dr Noreene Larsson here at bedside to see pt

## 2015-11-14 ENCOUNTER — Other Ambulatory Visit: Payer: Self-pay

## 2015-11-14 ENCOUNTER — Encounter (HOSPITAL_COMMUNITY): Payer: Self-pay | Admitting: Surgery

## 2015-11-14 DIAGNOSIS — N186 End stage renal disease: Secondary | ICD-10-CM | POA: Diagnosis not present

## 2015-11-14 DIAGNOSIS — D631 Anemia in chronic kidney disease: Secondary | ICD-10-CM | POA: Diagnosis not present

## 2015-11-14 DIAGNOSIS — T8249XD Other complication of vascular dialysis catheter, subsequent encounter: Secondary | ICD-10-CM | POA: Diagnosis not present

## 2015-11-14 DIAGNOSIS — D509 Iron deficiency anemia, unspecified: Secondary | ICD-10-CM | POA: Diagnosis not present

## 2015-11-16 DIAGNOSIS — D509 Iron deficiency anemia, unspecified: Secondary | ICD-10-CM | POA: Diagnosis not present

## 2015-11-16 DIAGNOSIS — I2782 Chronic pulmonary embolism: Secondary | ICD-10-CM | POA: Diagnosis not present

## 2015-11-16 DIAGNOSIS — D631 Anemia in chronic kidney disease: Secondary | ICD-10-CM | POA: Diagnosis not present

## 2015-11-16 DIAGNOSIS — T8249XD Other complication of vascular dialysis catheter, subsequent encounter: Secondary | ICD-10-CM | POA: Diagnosis not present

## 2015-11-16 DIAGNOSIS — N186 End stage renal disease: Secondary | ICD-10-CM | POA: Diagnosis not present

## 2015-11-19 DIAGNOSIS — T8249XD Other complication of vascular dialysis catheter, subsequent encounter: Secondary | ICD-10-CM | POA: Diagnosis not present

## 2015-11-19 DIAGNOSIS — D631 Anemia in chronic kidney disease: Secondary | ICD-10-CM | POA: Diagnosis not present

## 2015-11-19 DIAGNOSIS — D509 Iron deficiency anemia, unspecified: Secondary | ICD-10-CM | POA: Diagnosis not present

## 2015-11-19 DIAGNOSIS — N186 End stage renal disease: Secondary | ICD-10-CM | POA: Diagnosis not present

## 2015-11-21 DIAGNOSIS — T8249XD Other complication of vascular dialysis catheter, subsequent encounter: Secondary | ICD-10-CM | POA: Diagnosis not present

## 2015-11-21 DIAGNOSIS — D631 Anemia in chronic kidney disease: Secondary | ICD-10-CM | POA: Diagnosis not present

## 2015-11-21 DIAGNOSIS — N186 End stage renal disease: Secondary | ICD-10-CM | POA: Diagnosis not present

## 2015-11-21 DIAGNOSIS — D509 Iron deficiency anemia, unspecified: Secondary | ICD-10-CM | POA: Diagnosis not present

## 2015-11-21 DIAGNOSIS — I82599 Chronic embolism and thrombosis of other specified deep vein of unspecified lower extremity: Secondary | ICD-10-CM | POA: Diagnosis not present

## 2015-11-22 ENCOUNTER — Encounter (HOSPITAL_COMMUNITY): Payer: Self-pay | Admitting: *Deleted

## 2015-11-22 NOTE — Progress Notes (Addendum)
Shane Hamilton reports no changes since surgery last week.  Patient denies chest pain.  Patient reports CBGs of 80- 90.  I instructed patient to take 24 units of 70/30 Insulin Sunday evening with meal and to take 56 units of Insulin at bedtime Sunday. I instructed patient to check CBG to check CBG and if it is less than 70 to treat it with Glucose Gel, Glucose tablets or 1/2 cup of clear juice like apple juice or cranberry juice, or 1/2 cup of regular soda. (not cream soda). I instructed patient to recheck CBG in 15 minutes and if CBG is not greater than 70, to  Call [phone] (pre- op). If it is before pre-op opens to retreat as before and recheck CBG in 15 minutes. I told patient to make note of time that liquid is taken and amount, that surgical time may have to be adjusted.   Patient is unaware of difficult airway.  Patient has sleep apnea , but does not have a CPAP, "the Dr is suppose to be getting me one."

## 2015-11-23 DIAGNOSIS — D509 Iron deficiency anemia, unspecified: Secondary | ICD-10-CM | POA: Diagnosis not present

## 2015-11-23 DIAGNOSIS — D631 Anemia in chronic kidney disease: Secondary | ICD-10-CM | POA: Diagnosis not present

## 2015-11-23 DIAGNOSIS — T8249XD Other complication of vascular dialysis catheter, subsequent encounter: Secondary | ICD-10-CM | POA: Diagnosis not present

## 2015-11-23 DIAGNOSIS — N186 End stage renal disease: Secondary | ICD-10-CM | POA: Diagnosis not present

## 2015-11-24 ENCOUNTER — Encounter (HOSPITAL_COMMUNITY): Payer: Self-pay | Admitting: Certified Registered Nurse Anesthetist

## 2015-11-24 MED ORDER — CEFUROXIME SODIUM 1.5 G IJ SOLR
1.5000 g | INTRAMUSCULAR | Status: AC
  Administered 2016-03-31: 1.5 g via INTRAVENOUS
  Filled 2015-11-24 (×2): qty 1.5

## 2015-11-24 NOTE — Anesthesia Preprocedure Evaluation (Deleted)
Anesthesia Evaluation  Patient identified by MRN, date of birth, ID band Patient awake    Reviewed: Allergy & Precautions, NPO status , Patient's Chart, lab work & pertinent test results, reviewed documented beta blocker date and time   Airway Mallampati: III  TM Distance: >3 FB Neck ROM: Limited    Dental  (+) Teeth Intact, Dental Advisory Given   Pulmonary sleep apnea ,  Does not use his CPAP   breath sounds clear to auscultation       Cardiovascular hypertension, Pt. on medications and Pt. on home beta blockers + Peripheral Vascular Disease   Rhythm:Regular Rate:Normal  Coreg hs 11/11/2014   Neuro/Psych  Headaches, Seizures -, Well Controlled,     GI/Hepatic GERD  ,  Endo/Other  diabetes, Poorly Controlled, Type obesity  Renal/GU CRF, ESRF and DialysisRenal diseaseHD 11/11/2014     Musculoskeletal   Abdominal (+) + obese,   Peds  Hematology  (+) anemia ,   Anesthesia Other Findings   Reproductive/Obstetrics                             Anesthesia Physical  Anesthesia Plan  ASA: IV  Anesthesia Plan: General   Post-op Pain Management:    Induction: Intravenous  Airway Management Planned: Oral ETT  Additional Equipment:   Intra-op Plan:   Post-operative Plan: Extubation in OR  Informed Consent: I have reviewed the patients History and Physical, chart, labs and discussed the procedure including the risks, benefits and alternatives for the proposed anesthesia with the patient or authorized representative who has indicated his/her understanding and acceptance.   Dental advisory given  Plan Discussed with: CRNA  Anesthesia Plan Comments:        Anesthesia Quick Evaluation

## 2015-11-25 ENCOUNTER — Ambulatory Visit (HOSPITAL_COMMUNITY): Admission: RE | Admit: 2015-11-25 | Payer: Medicare Other | Source: Ambulatory Visit | Admitting: Vascular Surgery

## 2015-11-25 SURGERY — INSERTION HYBRID ANTERIOVENOUS GORTEX GRAFT
Anesthesia: Choice | Site: Arm Upper | Laterality: Left

## 2015-11-26 ENCOUNTER — Other Ambulatory Visit: Payer: Self-pay | Admitting: *Deleted

## 2015-11-26 DIAGNOSIS — N186 End stage renal disease: Secondary | ICD-10-CM | POA: Diagnosis not present

## 2015-11-26 DIAGNOSIS — D631 Anemia in chronic kidney disease: Secondary | ICD-10-CM | POA: Diagnosis not present

## 2015-11-26 DIAGNOSIS — D509 Iron deficiency anemia, unspecified: Secondary | ICD-10-CM | POA: Diagnosis not present

## 2015-11-26 DIAGNOSIS — T8249XD Other complication of vascular dialysis catheter, subsequent encounter: Secondary | ICD-10-CM | POA: Diagnosis not present

## 2015-11-28 DIAGNOSIS — I82599 Chronic embolism and thrombosis of other specified deep vein of unspecified lower extremity: Secondary | ICD-10-CM | POA: Diagnosis not present

## 2015-11-28 DIAGNOSIS — D509 Iron deficiency anemia, unspecified: Secondary | ICD-10-CM | POA: Diagnosis not present

## 2015-11-28 DIAGNOSIS — D631 Anemia in chronic kidney disease: Secondary | ICD-10-CM | POA: Diagnosis not present

## 2015-11-28 DIAGNOSIS — T8249XD Other complication of vascular dialysis catheter, subsequent encounter: Secondary | ICD-10-CM | POA: Diagnosis not present

## 2015-11-28 DIAGNOSIS — N186 End stage renal disease: Secondary | ICD-10-CM | POA: Diagnosis not present

## 2015-11-30 DIAGNOSIS — D509 Iron deficiency anemia, unspecified: Secondary | ICD-10-CM | POA: Diagnosis not present

## 2015-11-30 DIAGNOSIS — D631 Anemia in chronic kidney disease: Secondary | ICD-10-CM | POA: Diagnosis not present

## 2015-11-30 DIAGNOSIS — N186 End stage renal disease: Secondary | ICD-10-CM | POA: Diagnosis not present

## 2015-11-30 DIAGNOSIS — T8249XD Other complication of vascular dialysis catheter, subsequent encounter: Secondary | ICD-10-CM | POA: Diagnosis not present

## 2015-12-02 ENCOUNTER — Encounter (HOSPITAL_COMMUNITY): Payer: Self-pay | Admitting: *Deleted

## 2015-12-02 NOTE — Progress Notes (Signed)
Pt stated that his last dose of Coumadin was Sunday as instructed. Pt stated that he was advised to take half of his Lantus insulin the night before surgery ( 35 units instead of 70) and no insulin DOS. Pt made aware of diabetes protocol to check BS morning of procedure, interventions for BS < 70 and > 220 and emergency contact number. Pt stated " I get SOB when I move around. "  Pt stated, " I let the dialysis tech know, " when asked if the doctor was made aware  that he was SOB. Pt  was encouraged to notify MD about SOB and to seek emergency care if needed.  Pt chart forwarded to anesthesia for review.

## 2015-12-02 NOTE — Progress Notes (Signed)
Pt made aware to go to the emergency room if having any problems and to follow up with Dr. Nicky Pugh office tomorrow. Pt verbalized understanding of all instructions.

## 2015-12-03 DIAGNOSIS — E1129 Type 2 diabetes mellitus with other diabetic kidney complication: Secondary | ICD-10-CM | POA: Diagnosis not present

## 2015-12-03 DIAGNOSIS — N186 End stage renal disease: Secondary | ICD-10-CM | POA: Diagnosis not present

## 2015-12-03 DIAGNOSIS — T8249XD Other complication of vascular dialysis catheter, subsequent encounter: Secondary | ICD-10-CM | POA: Diagnosis not present

## 2015-12-03 DIAGNOSIS — D509 Iron deficiency anemia, unspecified: Secondary | ICD-10-CM | POA: Diagnosis not present

## 2015-12-03 DIAGNOSIS — Z992 Dependence on renal dialysis: Secondary | ICD-10-CM | POA: Diagnosis not present

## 2015-12-03 DIAGNOSIS — D631 Anemia in chronic kidney disease: Secondary | ICD-10-CM | POA: Diagnosis not present

## 2015-12-03 NOTE — Progress Notes (Signed)
Anesthesia Chart Review:  Pt is a 50 year old male scheduled for insertion of hybrid AV goretex graft L arm on 12/05/2015 with Dr. Imogene Burn.   Pt is a same day work up.   PMH includes:  HTN, DM, ESRD on HD, chronic pancreatitis, DVT (2011), anemia, OSA, GERD. Never smoker. BMI 59.   Medications include: lipitor, carvedilol, novolog, lantus, prilosec, coumadin. Last dose of coumadin 12/01/15.   Labs will be obtained DOS.   1 view CXR 11/13/15: Dialysis catheter tips project over the right atrium. Negative for pneumothorax.  EKG 07/25/15: NSR.   Echo 01/07/10:  - Left ventricle: The cavity size was normal. Systolic function was normal. The estimated ejection fraction was in the range of 60% to 65%. Wall motion was normal; there were no regional wall motion abnormalities. - Right ventricle: The cavity size was moderately dilated. Systolic function was reduced. - Right atrium: The atrium was mildly dilated. - Atrial septum: No defect or patent foramen ovale was identified. - Pulmonary arteries: Systolic pressure was moderately to severely increased. PA peak pressure: 65mm Hg (S).  Pt reported to PAT RN via telephone that he was feeling very SOB, especially with exertion onset 12/02/15. RN reports he seemed so SOB on the phone that she encouraged him to seek immediate care. Pt does not answer phone when I call, voicemail is not set up so I am unable to leave a message. Notified Stephanie in Dr. Nicky Pugh office of new onset SOB and inability to reach pt.   Rica Mast, FNP-BC [facility]/Anesthesiology Phone: [phone] 12/03/2015 3:46 PM

## 2015-12-05 ENCOUNTER — Encounter (HOSPITAL_COMMUNITY): Payer: Self-pay | Admitting: Emergency Medicine

## 2015-12-05 ENCOUNTER — Ambulatory Visit (HOSPITAL_COMMUNITY): Payer: Medicare Other

## 2015-12-05 ENCOUNTER — Ambulatory Visit (HOSPITAL_COMMUNITY)
Admission: RE | Admit: 2015-12-05 | Discharge: 2015-12-05 | Disposition: A | Discharge status: Home / Self Care | Payer: Medicare Other | Source: Ambulatory Visit | Attending: Vascular Surgery | Admitting: Vascular Surgery

## 2015-12-05 ENCOUNTER — Encounter (HOSPITAL_COMMUNITY): Payer: Self-pay | Admitting: Anesthesiology

## 2015-12-05 ENCOUNTER — Encounter (HOSPITAL_COMMUNITY)
Admission: RE | Disposition: A | Discharge status: Home / Self Care | Payer: Self-pay | Source: Ambulatory Visit | Attending: Vascular Surgery

## 2015-12-05 DIAGNOSIS — Z992 Dependence on renal dialysis: Secondary | ICD-10-CM | POA: Diagnosis not present

## 2015-12-05 DIAGNOSIS — Z794 Long term (current) use of insulin: Secondary | ICD-10-CM | POA: Diagnosis not present

## 2015-12-05 DIAGNOSIS — Z6841 Body Mass Index (BMI) 40.0 and over, adult: Secondary | ICD-10-CM | POA: Diagnosis not present

## 2015-12-05 DIAGNOSIS — D509 Iron deficiency anemia, unspecified: Secondary | ICD-10-CM | POA: Diagnosis not present

## 2015-12-05 DIAGNOSIS — E1122 Type 2 diabetes mellitus with diabetic chronic kidney disease: Secondary | ICD-10-CM | POA: Insufficient documentation

## 2015-12-05 DIAGNOSIS — T8249XD Other complication of vascular dialysis catheter, subsequent encounter: Secondary | ICD-10-CM | POA: Diagnosis not present

## 2015-12-05 DIAGNOSIS — N186 End stage renal disease: Secondary | ICD-10-CM | POA: Insufficient documentation

## 2015-12-05 DIAGNOSIS — K219 Gastro-esophageal reflux disease without esophagitis: Secondary | ICD-10-CM | POA: Insufficient documentation

## 2015-12-05 DIAGNOSIS — J9811 Atelectasis: Secondary | ICD-10-CM | POA: Diagnosis not present

## 2015-12-05 DIAGNOSIS — Z7901 Long term (current) use of anticoagulants: Secondary | ICD-10-CM | POA: Insufficient documentation

## 2015-12-05 DIAGNOSIS — R0602 Shortness of breath: Secondary | ICD-10-CM

## 2015-12-05 DIAGNOSIS — Z86718 Personal history of other venous thrombosis and embolism: Secondary | ICD-10-CM | POA: Insufficient documentation

## 2015-12-05 DIAGNOSIS — N2581 Secondary hyperparathyroidism of renal origin: Secondary | ICD-10-CM | POA: Insufficient documentation

## 2015-12-05 DIAGNOSIS — Z5309 Procedure and treatment not carried out because of other contraindication: Secondary | ICD-10-CM | POA: Insufficient documentation

## 2015-12-05 DIAGNOSIS — M109 Gout, unspecified: Secondary | ICD-10-CM | POA: Diagnosis not present

## 2015-12-05 DIAGNOSIS — Z79899 Other long term (current) drug therapy: Secondary | ICD-10-CM | POA: Insufficient documentation

## 2015-12-05 DIAGNOSIS — J069 Acute upper respiratory infection, unspecified: Secondary | ICD-10-CM | POA: Insufficient documentation

## 2015-12-05 DIAGNOSIS — E1129 Type 2 diabetes mellitus with other diabetic kidney complication: Secondary | ICD-10-CM | POA: Diagnosis not present

## 2015-12-05 DIAGNOSIS — I120 Hypertensive chronic kidney disease with stage 5 chronic kidney disease or end stage renal disease: Secondary | ICD-10-CM | POA: Diagnosis not present

## 2015-12-05 DIAGNOSIS — D631 Anemia in chronic kidney disease: Secondary | ICD-10-CM | POA: Diagnosis not present

## 2015-12-05 HISTORY — DX: Reserved for inherently not codable concepts without codable children: IMO0001

## 2015-12-05 LAB — PROTIME-INR
INR: 1.28 (ref 0.00–1.49)
PROTHROMBIN TIME: 16.1 s — AB (ref 11.6–15.2)

## 2015-12-05 LAB — POCT I-STAT 4, (NA,K, GLUC, HGB,HCT)
Glucose, Bld: 216 mg/dL — ABNORMAL HIGH (ref 65–99)
HCT: 32 % — ABNORMAL LOW (ref 39.0–52.0)
Hemoglobin: 10.9 g/dL — ABNORMAL LOW (ref 13.0–17.0)
Potassium: 4.5 mmol/L (ref 3.5–5.1)
Sodium: 137 mmol/L (ref 135–145)

## 2015-12-05 LAB — GLUCOSE, CAPILLARY: Glucose-Capillary: 207 mg/dL — ABNORMAL HIGH (ref 65–99)

## 2015-12-05 SURGERY — CANCELLED PROCEDURE
Site: Arm Upper | Laterality: Left

## 2015-12-05 MED ORDER — DEXAMETHASONE SODIUM PHOSPHATE 4 MG/ML IJ SOLN
INTRAMUSCULAR | Status: AC
Start: 2015-12-05 — End: 2015-12-05
  Filled 2015-12-05: qty 2

## 2015-12-05 MED ORDER — CARVEDILOL 3.125 MG PO TABS
ORAL_TABLET | ORAL | Status: AC
  Administered 2015-12-05: 3.125 mg via ORAL
  Filled 2015-12-05: qty 1

## 2015-12-05 MED ORDER — LIDOCAINE HCL (PF) 1 % IJ SOLN
INTRAMUSCULAR | Status: AC
  Filled 2015-12-05: qty 30

## 2015-12-05 MED ORDER — ONDANSETRON HCL 4 MG/2ML IJ SOLN
INTRAMUSCULAR | Status: AC
  Filled 2015-12-05: qty 2

## 2015-12-05 MED ORDER — MIDAZOLAM HCL 2 MG/2ML IJ SOLN
INTRAMUSCULAR | Status: AC
  Filled 2015-12-05: qty 2

## 2015-12-05 MED ORDER — PROPOFOL 10 MG/ML IV BOLUS
INTRAVENOUS | Status: AC
  Filled 2015-12-05: qty 20

## 2015-12-05 MED ORDER — LIDOCAINE HCL (CARDIAC) 20 MG/ML IV SOLN
INTRAVENOUS | Status: AC
Start: 2015-12-05 — End: 2015-12-05
  Filled 2015-12-05: qty 5

## 2015-12-05 MED ORDER — CARVEDILOL 3.125 MG PO TABS
3.1250 mg | ORAL_TABLET | Freq: Once | ORAL | Status: AC
  Administered 2015-12-05: 3.125 mg via ORAL
  Filled 2015-12-05: qty 1

## 2015-12-05 MED ORDER — CHLORHEXIDINE GLUCONATE CLOTH 2 % EX PADS: 6.0000 | MEDICATED_PAD | Freq: Once | CUTANEOUS | Status: DC

## 2015-12-05 MED ORDER — SODIUM CHLORIDE 0.9 % IV SOLN: INTRAVENOUS | Status: DC

## 2015-12-05 MED ORDER — ROCURONIUM BROMIDE 50 MG/5ML IV SOLN
INTRAVENOUS | Status: AC
  Filled 2015-12-05: qty 1

## 2015-12-05 MED ORDER — FENTANYL CITRATE (PF) 250 MCG/5ML IJ SOLN
INTRAMUSCULAR | Status: AC
  Filled 2015-12-05: qty 5

## 2015-12-05 SURGICAL SUPPLY — 43 items
ARMBAND PINK RESTRICT EXTREMIT (MISCELLANEOUS) IMPLANT
CANISTER SUCTION 2500CC (MISCELLANEOUS) IMPLANT
CLIP TI MEDIUM 6 (CLIP) IMPLANT
CLIP TI WIDE RED SMALL 6 (CLIP) IMPLANT
COVER DOME SNAP 22 D (MISCELLANEOUS) IMPLANT
COVER PROBE W GEL 5X96 (DRAPES) IMPLANT
ELECT REM PT RETURN 9FT ADLT (ELECTROSURGICAL)
ELECTRODE REM PT RTRN 9FT ADLT (ELECTROSURGICAL) IMPLANT
GAUZE SPONGE 4X4 12PLY STRL (GAUZE/BANDAGES/DRESSINGS) IMPLANT
GEL ULTRASOUND 20GR AQUASONIC (MISCELLANEOUS) IMPLANT
GLOVE BIO SURGEON STRL SZ7 (GLOVE) IMPLANT
GLOVE BIOGEL PI IND STRL 7.5 (GLOVE) IMPLANT
GLOVE BIOGEL PI INDICATOR 7.5 (GLOVE)
GOWN STRL REUS W/ TWL LRG LVL3 (GOWN DISPOSABLE) IMPLANT
GOWN STRL REUS W/TWL LRG LVL3 (GOWN DISPOSABLE)
HEMOSTAT SPONGE AVITENE ULTRA (HEMOSTASIS) IMPLANT
INSERT FOGARTY SM (MISCELLANEOUS) IMPLANT
INTRODUCER COOK 11FR (CATHETERS) IMPLANT
INTRODUCER SET COOK 14FR (MISCELLANEOUS) IMPLANT
KIT BASIN OR (CUSTOM PROCEDURE TRAY) IMPLANT
KIT ENCORE 26 ADVANTAGE (KITS) IMPLANT
KIT ROOM TURNOVER OR (KITS) IMPLANT
LIQUID BAND (GAUZE/BANDAGES/DRESSINGS) IMPLANT
NEEDLE HYPO 25GX1X1/2 BEV (NEEDLE) IMPLANT
NS IRRIG 1000ML POUR BTL (IV SOLUTION) IMPLANT
PACK CV ACCESS (CUSTOM PROCEDURE TRAY) IMPLANT
PAD ARMBOARD 7.5X6 YLW CONV (MISCELLANEOUS) IMPLANT
SET INTRODUCER 12FR PACEMAKER (SHEATH) IMPLANT
SET MICROPUNCTURE 5F STIFF (MISCELLANEOUS) IMPLANT
SHEATH AVANTI 11CM 5FR (MISCELLANEOUS) IMPLANT
SHEATH BRITE TIP 7FRX11 (SHEATH) IMPLANT
STOPCOCK 4 WAY LG BORE MALE ST (IV SETS) IMPLANT
SUT MNCRL AB 4-0 PS2 18 (SUTURE) IMPLANT
SUT PROLENE 6 0 BV (SUTURE) IMPLANT
SUT SILK 2 0 FS (SUTURE) IMPLANT
SUT VIC AB 3-0 SH 27 (SUTURE)
SUT VIC AB 3-0 SH 27X BRD (SUTURE) IMPLANT
SYR 20CC LL (SYRINGE) IMPLANT
SYR 30ML LL (SYRINGE) IMPLANT
TUBING CIL FLEX 10 FLL-RA (TUBING) IMPLANT
UNDERPAD 30X30 INCONTINENT (UNDERPADS AND DIAPERS) IMPLANT
WATER STERILE IRR 1000ML POUR (IV SOLUTION) IMPLANT
WIRE BENTSON .035X145CM (WIRE) IMPLANT

## 2015-12-05 NOTE — Interval H&P Note (Signed)
After further consultation with Anesthesia, will plan on cancellation of his procedure due to URI.  Will have the patient resume his coumadin and reschedule for 2 weeks.  Leonides Sake, MD, FACS [facility] Office: [phone] Pager: [phone]  12/05/2015, 8:01 AM

## 2015-12-05 NOTE — H&P (Signed)
Brief History and Physical  History of Present Illness  Shane Hamilton is a 50 y.o. male who presents with chief complaint: ESRD.  The patient presents today for placement of hybrid LUA AVG.    Past Medical History  Diagnosis Date  . Type 2 diabetes mellitus, uncontrolled (HCC)        . Morbid obesity (HCC)   . OSA (obstructive sleep apnea)   . Septic shock(785.52)   . Anemia   . Hypertension   . Gout   . LOC (loss of consciousness)   . DVT (deep venous thrombosis) (HCC) 2011  . Cardiomegaly   . Chronic anticoagulation   . Chronic pancreatitis (HCC)   . Secondary hyperparathyroidism (HCC)   . Pneumonia   . ESRD (end stage renal disease) (HCC)     T/Th/Sa  . Kidney stones   . GERD (gastroesophageal reflux disease)   . Seizures (HCC)     pt denies  . Headache     migraines  . Shortness of breath dyspnea     with exertion    Past Surgical History  Procedure Laterality Date  . Av fistula placement    . Amputation finger / thumb Right   . I&d extremity Left 06/20/2015    Procedure: IRRIGATION AND DEBRIDEMENT EXTREMITY;  Surgeon: Renford Dills, MD;  Location: [facility] ORS;  Service: Vascular;  Laterality: Left;  . Application of wound vac Left 06/20/2015    Procedure: APPLICATION OF WOUND VAC;  Surgeon: Renford Dills, MD;  Location: [facility] ORS;  Service: Vascular;  Laterality: Left;  . Av fistula placement Left 06/20/2015    Procedure: ARTERIOVENOUS (AV) FISTULA  ligation , excision;  Surgeon: Renford Dills, MD;  Location: [facility] ORS;  Service: Vascular;  Laterality: Left;  . Peripheral vascular catheterization N/A 06/25/2015    Procedure: Dialysis/Perma Catheter Insertion;  Surgeon: Annice Needy, MD;  Location: [facility] INVASIVE CV LAB;  Service: Cardiovascular;  Laterality: N/A;  . Insertion of dialysis catheter N/A 06/25/2015    Procedure: place a tunnelled catheter for long term use;  Surgeon: Annice Needy, MD;  Location: [facility] ORS;  Service: Vascular;  Laterality:  N/A;  . Insertion of dialysis catheter Left 07/25/2015    Procedure: INSERTION OF LEFT INTERNAL JUGULAR DIALYSIS CATHETER;  Surgeon: Pryor Ochoa, MD;  Location: [facility] OR;  Service: Vascular;  Laterality: Left;  . Insertion of dialysis catheter Left 08/01/2015    Procedure: REMOVAL OF DIALYSIS CATHETER; PLACEMNET OF NEW DIALYSIS CATHETER;  Surgeon: Pryor Ochoa, MD;  Location: [facility] OR;  Service: Vascular;  Laterality: Left;  . Peripheral vascular catheterization Bilateral 09/13/2015    Procedure: Upper Extremity Venography;  Surgeon: Sherren Kerns, MD;  Location: [facility] INVASIVE CV LAB;  Service: Cardiovascular;  Laterality: Bilateral;  . Exchange of a dialysis catheter Left 11/13/2015    Procedure: EXCHANGE OF A DIALYSIS CATHETER- LEFT INTERNAL JUGULAR;  Surgeon: Nada Libman, MD;  Location: [facility] OR;  Service: Vascular;  Laterality: Left;    Social History   Social History  . Marital Status: Married    Spouse Name: N/A  . Number of Children: N/A  . Years of Education: N/A   Occupational History  . disabled     Social History Main Topics  . Smoking status: Never Smoker   . Smokeless tobacco: Never Used  . Alcohol Use: No  . Drug Use: No  . Sexual Activity: Not on file   Other Topics Concern  .  Not on file   Social History Narrative    Family History  Problem Relation Age of Onset  . Diabetes Mother   . Hypertension Mother   . Diabetes Father   . Hypertension Father   . Hypertension Brother     Current Facility-Administered Medications on File Prior to Encounter  Medication Dose Route Frequency Provider Last Rate Last Dose  . cefUROXime (ZINACEF) 1.5 g in dextrose 5 % 50 mL IVPB  1.5 g Intravenous 30 min Pre-Op Fransisco Hertz, MD       Current Outpatient Prescriptions on File Prior to Encounter  Medication Sig Dispense Refill  . allopurinol (ZYLOPRIM) 300 MG tablet Take 300 mg by mouth 2 (two) times daily.    Marland Kitchen atorvastatin (LIPITOR) 10 MG tablet Take 10 mg by mouth daily.     . BD PEN NEEDLE NANO U/F 32G X 4 MM MISC     . calcium acetate (PHOSLO) 667 MG capsule Take 2,001 mg by mouth 3 (three) times daily with meals.     . carvedilol (COREG) 3.125 MG tablet Take 1 tablet (3.125 mg total) by mouth 2 (two) times daily with a meal. 60 tablet 0  . cinacalcet (SENSIPAR) 60 MG tablet Take 60 mg by mouth daily.    . clonazePAM (KLONOPIN) 1 MG tablet Take 1 mg by mouth 2 (two) times daily.    . colchicine 0.6 MG tablet Take 0.6 mg by mouth daily.    Marland Kitchen gabapentin (NEURONTIN) 300 MG capsule Take 600 mg by mouth every 8 (eight) hours.     . insulin aspart protamine- aspart (NOVOLOG MIX 70/30) (70-30) 100 UNIT/ML injection Inject 0.1 mLs (10 Units total) into the skin 3 (three) times daily with meals. (Patient taking differently: Inject 35 Units into the skin 3 (three) times daily with meals. ) 10 mL 11  . insulin glargine (LANTUS) 100 UNIT/ML injection Inject 0.35 mLs (35 Units total) into the skin at bedtime. (Patient taking differently: Inject 70 Units into the skin at bedtime. ) 10 mL 11  . lanthanum (FOSRENOL) 1000 MG chewable tablet Chew 1,000 mg by mouth 3 (three) times daily with meals.     . multivitamin (RENA-VIT) TABS tablet Take 1 tablet by mouth daily.   3  . omeprazole (PRILOSEC) 20 MG capsule Take 20 mg by mouth 2 (two) times daily before a meal.     . Oxycodone HCl 10 MG TABS Take 1 tablet (10 mg total) by mouth 2 (two) times daily as needed (pain). 20 tablet 0  . oxyCODONE-acetaminophen (ROXICET) 5-325 MG tablet Take 1 tablet by mouth every 6 (six) hours as needed. 10 tablet 0  . warfarin (COUMADIN) 7.5 MG tablet Take 7.5 mg by mouth See admin instructions. Pt takes 7.5mg  on Tuesday, Wednesday, Thursday, Saturday and Sunday and  on Monday and Friday      Allergies  Allergen Reactions  . Aspirin Other (See Comments)    Reaction:  GI upset     Review of Systems: As listed above, otherwise negative.  Physical Examination  Filed Vitals:   12/05/15  0621  BP: 122/80  Pulse: 85  Temp: 98.2 F (36.8 C)  TempSrc: Oral  Resp: 18  Height:  (1.803 m)  Weight: 421 lb (190.964 kg)  SpO2: 100%    General: A&O x 3, WDWN, coughing  Pulmonary: Sym exp, good air movt, B rales, no rhonchi, & wheezing  Cardiac: RRR, Nl S1, S2, no Murmurs, rubs or gallops  Gastrointestinal:  soft, NTND, -G/R, - HSM, - masses, - CVAT B  Musculoskeletal: M/S 5/5 throughout , Extremities without ischemic changes , L arm multiple healed incisions  Radiology: No results found.  Laboratory: CBC:    Component Value Date/Time   WBC 9.0 06/24/2015 1030   WBC 7.3 04/24/2014 1434   RBC 2.74* 06/24/2015 1030   RBC 2.42* 04/24/2014 1434   HGB 11.9* 11/13/2015 0700   HGB 8.5* 04/24/2014 1434   HCT 35.0* 11/13/2015 0700   HCT 26.9* 04/24/2014 1434   PLT 234 06/24/2015 1030   PLT 309 04/24/2014 1434   MCV 105.8* 06/24/2015 1030   MCV 111* 04/24/2014 1434   MCH 33.9 06/24/2015 1030   MCH 35.1* 04/24/2014 1434   MCHC 32.1 06/24/2015 1030   MCHC 31.6* 04/24/2014 1434   RDW 17.6* 06/24/2015 1030   RDW 19.9* 04/24/2014 1434   LYMPHSABS 2.6 06/18/2015 1420   MONOABS 1.0 06/18/2015 1420   EOSABS 0.3 06/18/2015 1420   BASOSABS 0.2* 06/18/2015 1420    BMP:    Component Value Date/Time   NA 138 11/13/2015 0700   K 4.4 11/13/2015 0700   K 3.6 04/24/2014 1434   CL 100* 09/13/2015 0619   CO2 28 06/25/2015 1719   GLUCOSE 151* 11/13/2015 0700   BUN 41* 09/13/2015 0619   CREATININE 7.90* 09/13/2015 0619   CALCIUM 8.9 06/25/2015 1719   GFRNONAA 4* 06/25/2015 1719   GFRAA 5* 06/25/2015 1719    Coagulation: Lab Results  Component Value Date   INR 1.28 12/05/2015   INR 1.11 11/13/2015   INR 2.62* 09/13/2015   No results found for: PTT  Lipids:    Component Value Date/Time   TRIG 269* 06/20/2015 0212     Medical Decision Making  LANIS STORLIE is a 50 y.o. male who presents with: ESRD, possible URI.   The patient is scheduled for:  LUA Hybrid AVG   Pt's respiratory status may result in cancellation.  Repeat CXR ordered.   I discussed with the patient the nature of angiographic procedures, especially the limited patencies of any endovascular intervention.  The patient is aware of that the risks of an angiographic procedure include but are not limited to: bleeding, infection, access site complications, renal failure, embolization, rupture of vessel, dissection, possible need for emergent surgical intervention, possible need for surgical procedures to treat the patient's pathology, and stroke and death.    Risk, benefits, and alternatives to access surgery were discussed.  The patient is aware the risks include but are not limited to: bleeding, infection, steal syndrome, nerve damage, ischemic monomelic neuropathy, failure to mature, and need for additional procedures.  The patient is aware of the risks and agrees to proceed.  Leonides Sake, MD [facility] [place] Office: [phone] Pager: [phone]  12/05/2015, 7:19 AM

## 2015-12-06 ENCOUNTER — Other Ambulatory Visit: Payer: Self-pay

## 2015-12-06 DIAGNOSIS — T8249XD Other complication of vascular dialysis catheter, subsequent encounter: Secondary | ICD-10-CM | POA: Diagnosis not present

## 2015-12-06 DIAGNOSIS — I82599 Chronic embolism and thrombosis of other specified deep vein of unspecified lower extremity: Secondary | ICD-10-CM | POA: Diagnosis not present

## 2015-12-06 DIAGNOSIS — D631 Anemia in chronic kidney disease: Secondary | ICD-10-CM | POA: Diagnosis not present

## 2015-12-06 DIAGNOSIS — D509 Iron deficiency anemia, unspecified: Secondary | ICD-10-CM | POA: Diagnosis not present

## 2015-12-06 DIAGNOSIS — N186 End stage renal disease: Secondary | ICD-10-CM | POA: Diagnosis not present

## 2015-12-06 DIAGNOSIS — E1129 Type 2 diabetes mellitus with other diabetic kidney complication: Secondary | ICD-10-CM | POA: Diagnosis not present

## 2015-12-06 NOTE — Progress Notes (Signed)
I was unable to reach patient by phone.  I left  A message on voice mail.  I instructed the patient to [facility] at [facility]  , nothing to eat or drink after midnight.   I instructed the patient to take the following medications in the am with just enough water to get them down: Allopurinol, Clonazepam, Omeprazole, Gapentin and pain medication if needed.  I instructed him to take 1/2 Lantus dose on Sunday night, as he was instructed with last surgery.I asked patient to not wear any lotions, powders, cologne, jewelry, piercing, make-up or nail polish.  I asked the patient to call [phone], in the am if there were any questions or problems.

## 2015-12-07 DIAGNOSIS — N186 End stage renal disease: Secondary | ICD-10-CM | POA: Diagnosis not present

## 2015-12-07 DIAGNOSIS — T8249XD Other complication of vascular dialysis catheter, subsequent encounter: Secondary | ICD-10-CM | POA: Diagnosis not present

## 2015-12-07 DIAGNOSIS — D631 Anemia in chronic kidney disease: Secondary | ICD-10-CM | POA: Diagnosis not present

## 2015-12-07 DIAGNOSIS — E1129 Type 2 diabetes mellitus with other diabetic kidney complication: Secondary | ICD-10-CM | POA: Diagnosis not present

## 2015-12-07 DIAGNOSIS — D509 Iron deficiency anemia, unspecified: Secondary | ICD-10-CM | POA: Diagnosis not present

## 2015-12-07 NOTE — Progress Notes (Signed)
Patient called concerned about being sick. I informed patient that I had spoken to Dr. Sampson GoonFitzgerald, R. about him being sick. I also informed patient that Dr. Imogene Burnhen was aware the he was sick.

## 2015-12-08 ENCOUNTER — Encounter (HOSPITAL_COMMUNITY): Payer: Self-pay | Admitting: Anesthesiology

## 2015-12-08 MED ORDER — SODIUM CHLORIDE 0.9 % IV SOLN: INTRAVENOUS | Status: DC

## 2015-12-08 MED ORDER — DEXTROSE 5 % IV SOLN
1.5000 g | INTRAVENOUS | Status: DC
  Filled 2015-12-08: qty 1.5

## 2015-12-08 MED ORDER — CHLORHEXIDINE GLUCONATE CLOTH 2 % EX PADS: 6.0000 | MEDICATED_PAD | Freq: Once | CUTANEOUS | Status: DC

## 2015-12-08 NOTE — Anesthesia Preprocedure Evaluation (Deleted)
Anesthesia Evaluation  Patient identified by MRN, date of birth, ID band Patient awake    Reviewed: Allergy & Precautions, NPO status , Patient's Chart, lab work & pertinent test results, reviewed documented beta blocker date and time   Airway Mallampati: III  TM Distance: >3 FB Neck ROM: Limited    Dental  (+) Teeth Intact, Dental Advisory Given   Pulmonary sleep apnea ,  Does not use his CPAP   breath sounds clear to auscultation       Cardiovascular hypertension, Pt. on medications and Pt. on home beta blockers + Peripheral Vascular Disease   Rhythm:Regular Rate:Normal  Coreg hs 11/11/2014   Neuro/Psych  Headaches, Seizures -, Well Controlled,     GI/Hepatic GERD  ,  Endo/Other  diabetes, Poorly Controlled, Type 2Morbid obesity  Renal/GU CRF, ESRF and DialysisRenal diseaseHD 11/11/2014     Musculoskeletal   Abdominal (+) + obese,   Peds  Hematology  (+) anemia ,   Anesthesia Other Findings   Reproductive/Obstetrics                             Anesthesia Physical  Anesthesia Plan  ASA: IV  Anesthesia Plan: General and MAC   Post-op Pain Management:    Induction: Intravenous  Airway Management Planned: Oral ETT and Video Laryngoscope Planned  Additional Equipment:   Intra-op Plan:   Post-operative Plan: Extubation in OR  Informed Consent: I have reviewed the patients History and Physical, chart, labs and discussed the procedure including the risks, benefits and alternatives for the proposed anesthesia with the patient or authorized representative who has indicated his/her understanding and acceptance.   Dental advisory given  Plan Discussed with: CRNA  Anesthesia Plan Comments:        Anesthesia Quick Evaluation

## 2015-12-09 ENCOUNTER — Ambulatory Visit (HOSPITAL_COMMUNITY): Admission: RE | Admit: 2015-12-09 | Payer: Medicare Other | Source: Ambulatory Visit | Admitting: Vascular Surgery

## 2015-12-09 ENCOUNTER — Encounter (HOSPITAL_COMMUNITY): Admission: RE | Payer: Self-pay | Source: Ambulatory Visit

## 2015-12-09 SURGERY — FISTULOGRAM
Anesthesia: Choice

## 2015-12-09 NOTE — Progress Notes (Signed)
Pt called this morning and states that he is still congested and was told on his previous surgical date (3/2) that he needed to wait a couple of weeks for his congestion to clear before having surgery and since it has only been a few days and he is still congested he would not be coming in.  Dr Imogene Burnhen paged, but has not returned call yet.

## 2015-12-10 DIAGNOSIS — D509 Iron deficiency anemia, unspecified: Secondary | ICD-10-CM | POA: Diagnosis not present

## 2015-12-10 DIAGNOSIS — N186 End stage renal disease: Secondary | ICD-10-CM | POA: Diagnosis not present

## 2015-12-10 DIAGNOSIS — T8249XD Other complication of vascular dialysis catheter, subsequent encounter: Secondary | ICD-10-CM | POA: Diagnosis not present

## 2015-12-10 DIAGNOSIS — E1129 Type 2 diabetes mellitus with other diabetic kidney complication: Secondary | ICD-10-CM | POA: Diagnosis not present

## 2015-12-10 DIAGNOSIS — D631 Anemia in chronic kidney disease: Secondary | ICD-10-CM | POA: Diagnosis not present

## 2015-12-12 DIAGNOSIS — E1129 Type 2 diabetes mellitus with other diabetic kidney complication: Secondary | ICD-10-CM | POA: Diagnosis not present

## 2015-12-12 DIAGNOSIS — I82599 Chronic embolism and thrombosis of other specified deep vein of unspecified lower extremity: Secondary | ICD-10-CM | POA: Diagnosis not present

## 2015-12-12 DIAGNOSIS — N186 End stage renal disease: Secondary | ICD-10-CM | POA: Diagnosis not present

## 2015-12-12 DIAGNOSIS — T8249XD Other complication of vascular dialysis catheter, subsequent encounter: Secondary | ICD-10-CM | POA: Diagnosis not present

## 2015-12-12 DIAGNOSIS — D631 Anemia in chronic kidney disease: Secondary | ICD-10-CM | POA: Diagnosis not present

## 2015-12-12 DIAGNOSIS — D509 Iron deficiency anemia, unspecified: Secondary | ICD-10-CM | POA: Diagnosis not present

## 2015-12-14 DIAGNOSIS — D631 Anemia in chronic kidney disease: Secondary | ICD-10-CM | POA: Diagnosis not present

## 2015-12-14 DIAGNOSIS — D509 Iron deficiency anemia, unspecified: Secondary | ICD-10-CM | POA: Diagnosis not present

## 2015-12-14 DIAGNOSIS — E1129 Type 2 diabetes mellitus with other diabetic kidney complication: Secondary | ICD-10-CM | POA: Diagnosis not present

## 2015-12-14 DIAGNOSIS — N186 End stage renal disease: Secondary | ICD-10-CM | POA: Diagnosis not present

## 2015-12-14 DIAGNOSIS — T8249XD Other complication of vascular dialysis catheter, subsequent encounter: Secondary | ICD-10-CM | POA: Diagnosis not present

## 2015-12-17 DIAGNOSIS — T8249XD Other complication of vascular dialysis catheter, subsequent encounter: Secondary | ICD-10-CM | POA: Diagnosis not present

## 2015-12-17 DIAGNOSIS — E1129 Type 2 diabetes mellitus with other diabetic kidney complication: Secondary | ICD-10-CM | POA: Diagnosis not present

## 2015-12-17 DIAGNOSIS — N186 End stage renal disease: Secondary | ICD-10-CM | POA: Diagnosis not present

## 2015-12-17 DIAGNOSIS — D631 Anemia in chronic kidney disease: Secondary | ICD-10-CM | POA: Diagnosis not present

## 2015-12-17 DIAGNOSIS — D509 Iron deficiency anemia, unspecified: Secondary | ICD-10-CM | POA: Diagnosis not present

## 2015-12-18 ENCOUNTER — Telehealth: Payer: Self-pay

## 2015-12-18 NOTE — Telephone Encounter (Signed)
Phone call to pt. To check on status of breathing and chest congestion.  Pt. Was to be rescheduled for Left UA Hybrid AVG; tentative surgery date was to be 3/20.  Pt. Stated he still has chest congestion, and doesn't think his antibiotic is doing any good.  Pt. Stated he is followed by Dr. Darrick Pennaeterding for his Primary Care.  Advised to discuss symptoms with [facility] tomorrow, so Dr. Darrick Pennaeterding can be made aware.  Questioned pt. If he has continued taking Coumadin; stated that he did resume his Coumadin.  Nurse will f/u next week by phone with pt. To check on status, and to reschedule the surgery for Left UA Hybrid AVG.  Agrees with plan.

## 2015-12-19 ENCOUNTER — Telehealth: Payer: Self-pay

## 2015-12-19 DIAGNOSIS — I82599 Chronic embolism and thrombosis of other specified deep vein of unspecified lower extremity: Secondary | ICD-10-CM | POA: Diagnosis not present

## 2015-12-19 DIAGNOSIS — D509 Iron deficiency anemia, unspecified: Secondary | ICD-10-CM | POA: Diagnosis not present

## 2015-12-19 DIAGNOSIS — D631 Anemia in chronic kidney disease: Secondary | ICD-10-CM | POA: Diagnosis not present

## 2015-12-19 DIAGNOSIS — E1129 Type 2 diabetes mellitus with other diabetic kidney complication: Secondary | ICD-10-CM | POA: Diagnosis not present

## 2015-12-19 DIAGNOSIS — T8249XD Other complication of vascular dialysis catheter, subsequent encounter: Secondary | ICD-10-CM | POA: Diagnosis not present

## 2015-12-19 DIAGNOSIS — N186 End stage renal disease: Secondary | ICD-10-CM | POA: Diagnosis not present

## 2015-12-19 NOTE — Telephone Encounter (Signed)
rec'd call from [facility].  Reported that pt's. Dialysis catheter will not work.   [facility] requesting a new catheter be placed.  Pt. On Coumadin.  Advised dialysis nurse that the pt. Would need to be admitted by Medical Service to reverse his Coumadin.  Discussed with Dr. Edilia Boickson.  Stated that pt. will need to be referred to Interventional Radiology, as no available space to add to surgery schedule on Friday.  [facility] that per Dr. Edilia Boickson, pt. Will need to get a catheter placed by IR.  Verb. Understanding.

## 2015-12-20 ENCOUNTER — Ambulatory Visit (HOSPITAL_COMMUNITY)
Admission: RE | Admit: 2015-12-20 | Discharge: 2015-12-20 | Disposition: A | Discharge status: Home / Self Care | Payer: Medicare Other | Source: Ambulatory Visit | Attending: Nurse Practitioner | Admitting: Nurse Practitioner

## 2015-12-20 ENCOUNTER — Other Ambulatory Visit (HOSPITAL_COMMUNITY): Payer: Self-pay | Admitting: Nurse Practitioner

## 2015-12-20 DIAGNOSIS — Y848 Other medical procedures as the cause of abnormal reaction of the patient, or of later complication, without mention of misadventure at the time of the procedure: Secondary | ICD-10-CM | POA: Insufficient documentation

## 2015-12-20 DIAGNOSIS — T8249XA Other complication of vascular dialysis catheter, initial encounter: Secondary | ICD-10-CM | POA: Diagnosis not present

## 2015-12-20 DIAGNOSIS — T8241XA Breakdown (mechanical) of vascular dialysis catheter, initial encounter: Secondary | ICD-10-CM | POA: Diagnosis not present

## 2015-12-20 DIAGNOSIS — Z452 Encounter for adjustment and management of vascular access device: Secondary | ICD-10-CM | POA: Diagnosis not present

## 2015-12-20 MED ORDER — LIDOCAINE HCL 1 % IJ SOLN
INTRAMUSCULAR | Status: DC
Start: 2015-12-20 — End: 2015-12-21
  Filled 2015-12-20: qty 20

## 2015-12-20 MED ORDER — DEXTROSE 5 % IV SOLN
3.0000 g | Freq: Once | INTRAVENOUS | Status: AC
  Administered 2015-12-20: 3 g via INTRAVENOUS
  Filled 2015-12-20: qty 3000

## 2015-12-20 MED ORDER — IOHEXOL 300 MG/ML  SOLN
10.0000 mL | Freq: Once | INTRAMUSCULAR | Status: AC | PRN
  Administered 2015-12-20: 10 mL via INTRAVENOUS

## 2015-12-20 MED ORDER — SODIUM CHLORIDE 0.9 % IV SOLN: INTRAVENOUS | Status: DC

## 2015-12-20 MED ORDER — HEPARIN SODIUM (PORCINE) 1000 UNIT/ML IJ SOLN
INTRAMUSCULAR | Status: AC
  Filled 2015-12-20: qty 1

## 2015-12-20 NOTE — Procedures (Signed)
Interventional Radiology Procedure Note  Procedure: Exchange of left IJ tunneled HD cath, non-functioning.  New tunneled HD cath. Tip is positioned at the superior cavoatrial junction and catheter is ready for immediate use.  Complications: None Recommendations:  - Ok to shower tomorrow - Do not submerge - Routine line care   Signed,  Yvone NeuJaime S. Loreta AveWagner, DO   t

## 2015-12-21 DIAGNOSIS — N186 End stage renal disease: Secondary | ICD-10-CM | POA: Diagnosis not present

## 2015-12-21 DIAGNOSIS — T8249XD Other complication of vascular dialysis catheter, subsequent encounter: Secondary | ICD-10-CM | POA: Diagnosis not present

## 2015-12-21 DIAGNOSIS — E1129 Type 2 diabetes mellitus with other diabetic kidney complication: Secondary | ICD-10-CM | POA: Diagnosis not present

## 2015-12-21 DIAGNOSIS — D509 Iron deficiency anemia, unspecified: Secondary | ICD-10-CM | POA: Diagnosis not present

## 2015-12-21 DIAGNOSIS — D631 Anemia in chronic kidney disease: Secondary | ICD-10-CM | POA: Diagnosis not present

## 2015-12-24 DIAGNOSIS — T8249XD Other complication of vascular dialysis catheter, subsequent encounter: Secondary | ICD-10-CM | POA: Diagnosis not present

## 2015-12-24 DIAGNOSIS — E1129 Type 2 diabetes mellitus with other diabetic kidney complication: Secondary | ICD-10-CM | POA: Diagnosis not present

## 2015-12-24 DIAGNOSIS — N186 End stage renal disease: Secondary | ICD-10-CM | POA: Diagnosis not present

## 2015-12-24 DIAGNOSIS — D631 Anemia in chronic kidney disease: Secondary | ICD-10-CM | POA: Diagnosis not present

## 2015-12-24 DIAGNOSIS — D509 Iron deficiency anemia, unspecified: Secondary | ICD-10-CM | POA: Diagnosis not present

## 2015-12-26 DIAGNOSIS — N186 End stage renal disease: Secondary | ICD-10-CM | POA: Diagnosis not present

## 2015-12-26 DIAGNOSIS — D631 Anemia in chronic kidney disease: Secondary | ICD-10-CM | POA: Diagnosis not present

## 2015-12-26 DIAGNOSIS — T8249XD Other complication of vascular dialysis catheter, subsequent encounter: Secondary | ICD-10-CM | POA: Diagnosis not present

## 2015-12-26 DIAGNOSIS — D509 Iron deficiency anemia, unspecified: Secondary | ICD-10-CM | POA: Diagnosis not present

## 2015-12-26 DIAGNOSIS — I82599 Chronic embolism and thrombosis of other specified deep vein of unspecified lower extremity: Secondary | ICD-10-CM | POA: Diagnosis not present

## 2015-12-26 DIAGNOSIS — E1129 Type 2 diabetes mellitus with other diabetic kidney complication: Secondary | ICD-10-CM | POA: Diagnosis not present

## 2015-12-28 DIAGNOSIS — D631 Anemia in chronic kidney disease: Secondary | ICD-10-CM | POA: Diagnosis not present

## 2015-12-28 DIAGNOSIS — N186 End stage renal disease: Secondary | ICD-10-CM | POA: Diagnosis not present

## 2015-12-28 DIAGNOSIS — E1129 Type 2 diabetes mellitus with other diabetic kidney complication: Secondary | ICD-10-CM | POA: Diagnosis not present

## 2015-12-28 DIAGNOSIS — D509 Iron deficiency anemia, unspecified: Secondary | ICD-10-CM | POA: Diagnosis not present

## 2015-12-28 DIAGNOSIS — T8249XD Other complication of vascular dialysis catheter, subsequent encounter: Secondary | ICD-10-CM | POA: Diagnosis not present

## 2015-12-31 DIAGNOSIS — E1129 Type 2 diabetes mellitus with other diabetic kidney complication: Secondary | ICD-10-CM | POA: Diagnosis not present

## 2015-12-31 DIAGNOSIS — D631 Anemia in chronic kidney disease: Secondary | ICD-10-CM | POA: Diagnosis not present

## 2015-12-31 DIAGNOSIS — D509 Iron deficiency anemia, unspecified: Secondary | ICD-10-CM | POA: Diagnosis not present

## 2015-12-31 DIAGNOSIS — T8249XD Other complication of vascular dialysis catheter, subsequent encounter: Secondary | ICD-10-CM | POA: Diagnosis not present

## 2015-12-31 DIAGNOSIS — N186 End stage renal disease: Secondary | ICD-10-CM | POA: Diagnosis not present

## 2016-01-02 DIAGNOSIS — T8249XD Other complication of vascular dialysis catheter, subsequent encounter: Secondary | ICD-10-CM | POA: Diagnosis not present

## 2016-01-02 DIAGNOSIS — E1129 Type 2 diabetes mellitus with other diabetic kidney complication: Secondary | ICD-10-CM | POA: Diagnosis not present

## 2016-01-02 DIAGNOSIS — N186 End stage renal disease: Secondary | ICD-10-CM | POA: Diagnosis not present

## 2016-01-02 DIAGNOSIS — D631 Anemia in chronic kidney disease: Secondary | ICD-10-CM | POA: Diagnosis not present

## 2016-01-02 DIAGNOSIS — D509 Iron deficiency anemia, unspecified: Secondary | ICD-10-CM | POA: Diagnosis not present

## 2016-01-02 DIAGNOSIS — I82599 Chronic embolism and thrombosis of other specified deep vein of unspecified lower extremity: Secondary | ICD-10-CM | POA: Diagnosis not present

## 2016-01-03 DIAGNOSIS — Z992 Dependence on renal dialysis: Secondary | ICD-10-CM | POA: Diagnosis not present

## 2016-01-03 DIAGNOSIS — N186 End stage renal disease: Secondary | ICD-10-CM | POA: Diagnosis not present

## 2016-01-03 DIAGNOSIS — E1129 Type 2 diabetes mellitus with other diabetic kidney complication: Secondary | ICD-10-CM | POA: Diagnosis not present

## 2016-01-04 DIAGNOSIS — D509 Iron deficiency anemia, unspecified: Secondary | ICD-10-CM | POA: Diagnosis not present

## 2016-01-04 DIAGNOSIS — E1129 Type 2 diabetes mellitus with other diabetic kidney complication: Secondary | ICD-10-CM | POA: Diagnosis not present

## 2016-01-04 DIAGNOSIS — D631 Anemia in chronic kidney disease: Secondary | ICD-10-CM | POA: Diagnosis not present

## 2016-01-04 DIAGNOSIS — Z86711 Personal history of pulmonary embolism: Secondary | ICD-10-CM | POA: Diagnosis not present

## 2016-01-04 DIAGNOSIS — T8249XD Other complication of vascular dialysis catheter, subsequent encounter: Secondary | ICD-10-CM | POA: Diagnosis not present

## 2016-01-04 DIAGNOSIS — N186 End stage renal disease: Secondary | ICD-10-CM | POA: Diagnosis not present

## 2016-01-07 DIAGNOSIS — T8249XD Other complication of vascular dialysis catheter, subsequent encounter: Secondary | ICD-10-CM | POA: Diagnosis not present

## 2016-01-07 DIAGNOSIS — N186 End stage renal disease: Secondary | ICD-10-CM | POA: Diagnosis not present

## 2016-01-07 DIAGNOSIS — E1129 Type 2 diabetes mellitus with other diabetic kidney complication: Secondary | ICD-10-CM | POA: Diagnosis not present

## 2016-01-07 DIAGNOSIS — D509 Iron deficiency anemia, unspecified: Secondary | ICD-10-CM | POA: Diagnosis not present

## 2016-01-07 DIAGNOSIS — D631 Anemia in chronic kidney disease: Secondary | ICD-10-CM | POA: Diagnosis not present

## 2016-01-09 DIAGNOSIS — N186 End stage renal disease: Secondary | ICD-10-CM | POA: Diagnosis not present

## 2016-01-09 DIAGNOSIS — D631 Anemia in chronic kidney disease: Secondary | ICD-10-CM | POA: Diagnosis not present

## 2016-01-09 DIAGNOSIS — T8249XD Other complication of vascular dialysis catheter, subsequent encounter: Secondary | ICD-10-CM | POA: Diagnosis not present

## 2016-01-09 DIAGNOSIS — I82599 Chronic embolism and thrombosis of other specified deep vein of unspecified lower extremity: Secondary | ICD-10-CM | POA: Diagnosis not present

## 2016-01-09 DIAGNOSIS — D509 Iron deficiency anemia, unspecified: Secondary | ICD-10-CM | POA: Diagnosis not present

## 2016-01-09 DIAGNOSIS — E1129 Type 2 diabetes mellitus with other diabetic kidney complication: Secondary | ICD-10-CM | POA: Diagnosis not present

## 2016-01-11 DIAGNOSIS — E1129 Type 2 diabetes mellitus with other diabetic kidney complication: Secondary | ICD-10-CM | POA: Diagnosis not present

## 2016-01-11 DIAGNOSIS — T8249XD Other complication of vascular dialysis catheter, subsequent encounter: Secondary | ICD-10-CM | POA: Diagnosis not present

## 2016-01-11 DIAGNOSIS — D631 Anemia in chronic kidney disease: Secondary | ICD-10-CM | POA: Diagnosis not present

## 2016-01-11 DIAGNOSIS — D509 Iron deficiency anemia, unspecified: Secondary | ICD-10-CM | POA: Diagnosis not present

## 2016-01-11 DIAGNOSIS — N186 End stage renal disease: Secondary | ICD-10-CM | POA: Diagnosis not present

## 2016-01-14 DIAGNOSIS — D631 Anemia in chronic kidney disease: Secondary | ICD-10-CM | POA: Diagnosis not present

## 2016-01-14 DIAGNOSIS — E1129 Type 2 diabetes mellitus with other diabetic kidney complication: Secondary | ICD-10-CM | POA: Diagnosis not present

## 2016-01-14 DIAGNOSIS — T8249XD Other complication of vascular dialysis catheter, subsequent encounter: Secondary | ICD-10-CM | POA: Diagnosis not present

## 2016-01-14 DIAGNOSIS — N186 End stage renal disease: Secondary | ICD-10-CM | POA: Diagnosis not present

## 2016-01-14 DIAGNOSIS — D509 Iron deficiency anemia, unspecified: Secondary | ICD-10-CM | POA: Diagnosis not present

## 2016-01-16 DIAGNOSIS — D509 Iron deficiency anemia, unspecified: Secondary | ICD-10-CM | POA: Diagnosis not present

## 2016-01-16 DIAGNOSIS — D631 Anemia in chronic kidney disease: Secondary | ICD-10-CM | POA: Diagnosis not present

## 2016-01-16 DIAGNOSIS — I82599 Chronic embolism and thrombosis of other specified deep vein of unspecified lower extremity: Secondary | ICD-10-CM | POA: Diagnosis not present

## 2016-01-16 DIAGNOSIS — N186 End stage renal disease: Secondary | ICD-10-CM | POA: Diagnosis not present

## 2016-01-16 DIAGNOSIS — E1129 Type 2 diabetes mellitus with other diabetic kidney complication: Secondary | ICD-10-CM | POA: Diagnosis not present

## 2016-01-16 DIAGNOSIS — T8249XD Other complication of vascular dialysis catheter, subsequent encounter: Secondary | ICD-10-CM | POA: Diagnosis not present

## 2016-01-18 DIAGNOSIS — E1129 Type 2 diabetes mellitus with other diabetic kidney complication: Secondary | ICD-10-CM | POA: Diagnosis not present

## 2016-01-18 DIAGNOSIS — D509 Iron deficiency anemia, unspecified: Secondary | ICD-10-CM | POA: Diagnosis not present

## 2016-01-18 DIAGNOSIS — N186 End stage renal disease: Secondary | ICD-10-CM | POA: Diagnosis not present

## 2016-01-18 DIAGNOSIS — D631 Anemia in chronic kidney disease: Secondary | ICD-10-CM | POA: Diagnosis not present

## 2016-01-18 DIAGNOSIS — T8249XD Other complication of vascular dialysis catheter, subsequent encounter: Secondary | ICD-10-CM | POA: Diagnosis not present

## 2016-01-21 DIAGNOSIS — T8249XD Other complication of vascular dialysis catheter, subsequent encounter: Secondary | ICD-10-CM | POA: Diagnosis not present

## 2016-01-21 DIAGNOSIS — E1129 Type 2 diabetes mellitus with other diabetic kidney complication: Secondary | ICD-10-CM | POA: Diagnosis not present

## 2016-01-21 DIAGNOSIS — N186 End stage renal disease: Secondary | ICD-10-CM | POA: Diagnosis not present

## 2016-01-21 DIAGNOSIS — D509 Iron deficiency anemia, unspecified: Secondary | ICD-10-CM | POA: Diagnosis not present

## 2016-01-21 DIAGNOSIS — D631 Anemia in chronic kidney disease: Secondary | ICD-10-CM | POA: Diagnosis not present

## 2016-01-23 DIAGNOSIS — T8249XD Other complication of vascular dialysis catheter, subsequent encounter: Secondary | ICD-10-CM | POA: Diagnosis not present

## 2016-01-23 DIAGNOSIS — E1129 Type 2 diabetes mellitus with other diabetic kidney complication: Secondary | ICD-10-CM | POA: Diagnosis not present

## 2016-01-23 DIAGNOSIS — I82599 Chronic embolism and thrombosis of other specified deep vein of unspecified lower extremity: Secondary | ICD-10-CM | POA: Diagnosis not present

## 2016-01-23 DIAGNOSIS — N186 End stage renal disease: Secondary | ICD-10-CM | POA: Diagnosis not present

## 2016-01-23 DIAGNOSIS — D631 Anemia in chronic kidney disease: Secondary | ICD-10-CM | POA: Diagnosis not present

## 2016-01-23 DIAGNOSIS — D509 Iron deficiency anemia, unspecified: Secondary | ICD-10-CM | POA: Diagnosis not present

## 2016-01-25 DIAGNOSIS — N186 End stage renal disease: Secondary | ICD-10-CM | POA: Diagnosis not present

## 2016-01-25 DIAGNOSIS — E1129 Type 2 diabetes mellitus with other diabetic kidney complication: Secondary | ICD-10-CM | POA: Diagnosis not present

## 2016-01-25 DIAGNOSIS — D631 Anemia in chronic kidney disease: Secondary | ICD-10-CM | POA: Diagnosis not present

## 2016-01-25 DIAGNOSIS — T8249XD Other complication of vascular dialysis catheter, subsequent encounter: Secondary | ICD-10-CM | POA: Diagnosis not present

## 2016-01-25 DIAGNOSIS — D509 Iron deficiency anemia, unspecified: Secondary | ICD-10-CM | POA: Diagnosis not present

## 2016-01-28 DIAGNOSIS — D631 Anemia in chronic kidney disease: Secondary | ICD-10-CM | POA: Diagnosis not present

## 2016-01-28 DIAGNOSIS — T8249XD Other complication of vascular dialysis catheter, subsequent encounter: Secondary | ICD-10-CM | POA: Diagnosis not present

## 2016-01-28 DIAGNOSIS — D509 Iron deficiency anemia, unspecified: Secondary | ICD-10-CM | POA: Diagnosis not present

## 2016-01-28 DIAGNOSIS — N186 End stage renal disease: Secondary | ICD-10-CM | POA: Diagnosis not present

## 2016-01-28 DIAGNOSIS — E1129 Type 2 diabetes mellitus with other diabetic kidney complication: Secondary | ICD-10-CM | POA: Diagnosis not present

## 2016-01-30 DIAGNOSIS — E1129 Type 2 diabetes mellitus with other diabetic kidney complication: Secondary | ICD-10-CM | POA: Diagnosis not present

## 2016-01-30 DIAGNOSIS — D631 Anemia in chronic kidney disease: Secondary | ICD-10-CM | POA: Diagnosis not present

## 2016-01-30 DIAGNOSIS — T8249XD Other complication of vascular dialysis catheter, subsequent encounter: Secondary | ICD-10-CM | POA: Diagnosis not present

## 2016-01-30 DIAGNOSIS — D509 Iron deficiency anemia, unspecified: Secondary | ICD-10-CM | POA: Diagnosis not present

## 2016-01-30 DIAGNOSIS — N186 End stage renal disease: Secondary | ICD-10-CM | POA: Diagnosis not present

## 2016-01-30 DIAGNOSIS — I82599 Chronic embolism and thrombosis of other specified deep vein of unspecified lower extremity: Secondary | ICD-10-CM | POA: Diagnosis not present

## 2016-01-31 DIAGNOSIS — D631 Anemia in chronic kidney disease: Secondary | ICD-10-CM | POA: Diagnosis not present

## 2016-01-31 DIAGNOSIS — T8249XD Other complication of vascular dialysis catheter, subsequent encounter: Secondary | ICD-10-CM | POA: Diagnosis not present

## 2016-01-31 DIAGNOSIS — D509 Iron deficiency anemia, unspecified: Secondary | ICD-10-CM | POA: Diagnosis not present

## 2016-01-31 DIAGNOSIS — E1129 Type 2 diabetes mellitus with other diabetic kidney complication: Secondary | ICD-10-CM | POA: Diagnosis not present

## 2016-01-31 DIAGNOSIS — N186 End stage renal disease: Secondary | ICD-10-CM | POA: Diagnosis not present

## 2016-02-01 DIAGNOSIS — D631 Anemia in chronic kidney disease: Secondary | ICD-10-CM | POA: Diagnosis not present

## 2016-02-01 DIAGNOSIS — D509 Iron deficiency anemia, unspecified: Secondary | ICD-10-CM | POA: Diagnosis not present

## 2016-02-01 DIAGNOSIS — T8249XD Other complication of vascular dialysis catheter, subsequent encounter: Secondary | ICD-10-CM | POA: Diagnosis not present

## 2016-02-01 DIAGNOSIS — E1129 Type 2 diabetes mellitus with other diabetic kidney complication: Secondary | ICD-10-CM | POA: Diagnosis not present

## 2016-02-01 DIAGNOSIS — N186 End stage renal disease: Secondary | ICD-10-CM | POA: Diagnosis not present

## 2016-02-02 DIAGNOSIS — Z992 Dependence on renal dialysis: Secondary | ICD-10-CM | POA: Diagnosis not present

## 2016-02-02 DIAGNOSIS — E1129 Type 2 diabetes mellitus with other diabetic kidney complication: Secondary | ICD-10-CM | POA: Diagnosis not present

## 2016-02-02 DIAGNOSIS — N186 End stage renal disease: Secondary | ICD-10-CM | POA: Diagnosis not present

## 2016-02-04 DIAGNOSIS — D509 Iron deficiency anemia, unspecified: Secondary | ICD-10-CM | POA: Diagnosis not present

## 2016-02-04 DIAGNOSIS — D631 Anemia in chronic kidney disease: Secondary | ICD-10-CM | POA: Diagnosis not present

## 2016-02-04 DIAGNOSIS — E8779 Other fluid overload: Secondary | ICD-10-CM | POA: Diagnosis not present

## 2016-02-04 DIAGNOSIS — N186 End stage renal disease: Secondary | ICD-10-CM | POA: Diagnosis not present

## 2016-02-04 DIAGNOSIS — E1129 Type 2 diabetes mellitus with other diabetic kidney complication: Secondary | ICD-10-CM | POA: Diagnosis not present

## 2016-02-06 DIAGNOSIS — N186 End stage renal disease: Secondary | ICD-10-CM | POA: Diagnosis not present

## 2016-02-06 DIAGNOSIS — E1129 Type 2 diabetes mellitus with other diabetic kidney complication: Secondary | ICD-10-CM | POA: Diagnosis not present

## 2016-02-06 DIAGNOSIS — I82599 Chronic embolism and thrombosis of other specified deep vein of unspecified lower extremity: Secondary | ICD-10-CM | POA: Diagnosis not present

## 2016-02-06 DIAGNOSIS — E8779 Other fluid overload: Secondary | ICD-10-CM | POA: Diagnosis not present

## 2016-02-06 DIAGNOSIS — D631 Anemia in chronic kidney disease: Secondary | ICD-10-CM | POA: Diagnosis not present

## 2016-02-06 DIAGNOSIS — D509 Iron deficiency anemia, unspecified: Secondary | ICD-10-CM | POA: Diagnosis not present

## 2016-02-07 DIAGNOSIS — E8779 Other fluid overload: Secondary | ICD-10-CM | POA: Diagnosis not present

## 2016-02-07 DIAGNOSIS — E1129 Type 2 diabetes mellitus with other diabetic kidney complication: Secondary | ICD-10-CM | POA: Diagnosis not present

## 2016-02-07 DIAGNOSIS — N186 End stage renal disease: Secondary | ICD-10-CM | POA: Diagnosis not present

## 2016-02-07 DIAGNOSIS — D631 Anemia in chronic kidney disease: Secondary | ICD-10-CM | POA: Diagnosis not present

## 2016-02-07 DIAGNOSIS — D509 Iron deficiency anemia, unspecified: Secondary | ICD-10-CM | POA: Diagnosis not present

## 2016-02-10 DIAGNOSIS — Z992 Dependence on renal dialysis: Secondary | ICD-10-CM | POA: Diagnosis not present

## 2016-02-10 DIAGNOSIS — N186 End stage renal disease: Secondary | ICD-10-CM | POA: Diagnosis not present

## 2016-02-13 DIAGNOSIS — N186 End stage renal disease: Secondary | ICD-10-CM | POA: Diagnosis not present

## 2016-02-13 DIAGNOSIS — E1129 Type 2 diabetes mellitus with other diabetic kidney complication: Secondary | ICD-10-CM | POA: Diagnosis not present

## 2016-02-13 DIAGNOSIS — E8779 Other fluid overload: Secondary | ICD-10-CM | POA: Diagnosis not present

## 2016-02-13 DIAGNOSIS — D631 Anemia in chronic kidney disease: Secondary | ICD-10-CM | POA: Diagnosis not present

## 2016-02-13 DIAGNOSIS — D509 Iron deficiency anemia, unspecified: Secondary | ICD-10-CM | POA: Diagnosis not present

## 2016-02-15 DIAGNOSIS — N186 End stage renal disease: Secondary | ICD-10-CM | POA: Diagnosis not present

## 2016-02-15 DIAGNOSIS — D631 Anemia in chronic kidney disease: Secondary | ICD-10-CM | POA: Diagnosis not present

## 2016-02-15 DIAGNOSIS — D509 Iron deficiency anemia, unspecified: Secondary | ICD-10-CM | POA: Diagnosis not present

## 2016-02-15 DIAGNOSIS — E1129 Type 2 diabetes mellitus with other diabetic kidney complication: Secondary | ICD-10-CM | POA: Diagnosis not present

## 2016-02-15 DIAGNOSIS — E8779 Other fluid overload: Secondary | ICD-10-CM | POA: Diagnosis not present

## 2016-02-18 DIAGNOSIS — E1129 Type 2 diabetes mellitus with other diabetic kidney complication: Secondary | ICD-10-CM | POA: Diagnosis not present

## 2016-02-18 DIAGNOSIS — D509 Iron deficiency anemia, unspecified: Secondary | ICD-10-CM | POA: Diagnosis not present

## 2016-02-18 DIAGNOSIS — D631 Anemia in chronic kidney disease: Secondary | ICD-10-CM | POA: Diagnosis not present

## 2016-02-18 DIAGNOSIS — N186 End stage renal disease: Secondary | ICD-10-CM | POA: Diagnosis not present

## 2016-02-18 DIAGNOSIS — E8779 Other fluid overload: Secondary | ICD-10-CM | POA: Diagnosis not present

## 2016-02-19 DIAGNOSIS — D509 Iron deficiency anemia, unspecified: Secondary | ICD-10-CM | POA: Diagnosis not present

## 2016-02-19 DIAGNOSIS — D631 Anemia in chronic kidney disease: Secondary | ICD-10-CM | POA: Diagnosis not present

## 2016-02-19 DIAGNOSIS — E8779 Other fluid overload: Secondary | ICD-10-CM | POA: Diagnosis not present

## 2016-02-19 DIAGNOSIS — N186 End stage renal disease: Secondary | ICD-10-CM | POA: Diagnosis not present

## 2016-02-19 DIAGNOSIS — E1129 Type 2 diabetes mellitus with other diabetic kidney complication: Secondary | ICD-10-CM | POA: Diagnosis not present

## 2016-02-20 DIAGNOSIS — D631 Anemia in chronic kidney disease: Secondary | ICD-10-CM | POA: Diagnosis not present

## 2016-02-20 DIAGNOSIS — N186 End stage renal disease: Secondary | ICD-10-CM | POA: Diagnosis not present

## 2016-02-20 DIAGNOSIS — D509 Iron deficiency anemia, unspecified: Secondary | ICD-10-CM | POA: Diagnosis not present

## 2016-02-20 DIAGNOSIS — E1129 Type 2 diabetes mellitus with other diabetic kidney complication: Secondary | ICD-10-CM | POA: Diagnosis not present

## 2016-02-20 DIAGNOSIS — E8779 Other fluid overload: Secondary | ICD-10-CM | POA: Diagnosis not present

## 2016-02-21 ENCOUNTER — Encounter: Payer: Self-pay | Admitting: *Deleted

## 2016-02-21 ENCOUNTER — Other Ambulatory Visit: Payer: Self-pay | Admitting: *Deleted

## 2016-02-22 DIAGNOSIS — D509 Iron deficiency anemia, unspecified: Secondary | ICD-10-CM | POA: Diagnosis not present

## 2016-02-22 DIAGNOSIS — E8779 Other fluid overload: Secondary | ICD-10-CM | POA: Diagnosis not present

## 2016-02-22 DIAGNOSIS — E1129 Type 2 diabetes mellitus with other diabetic kidney complication: Secondary | ICD-10-CM | POA: Diagnosis not present

## 2016-02-22 DIAGNOSIS — D631 Anemia in chronic kidney disease: Secondary | ICD-10-CM | POA: Diagnosis not present

## 2016-02-22 DIAGNOSIS — N186 End stage renal disease: Secondary | ICD-10-CM | POA: Diagnosis not present

## 2016-02-25 DIAGNOSIS — E1129 Type 2 diabetes mellitus with other diabetic kidney complication: Secondary | ICD-10-CM | POA: Diagnosis not present

## 2016-02-25 DIAGNOSIS — I2782 Chronic pulmonary embolism: Secondary | ICD-10-CM | POA: Diagnosis not present

## 2016-02-25 DIAGNOSIS — D631 Anemia in chronic kidney disease: Secondary | ICD-10-CM | POA: Diagnosis not present

## 2016-02-25 DIAGNOSIS — E8779 Other fluid overload: Secondary | ICD-10-CM | POA: Diagnosis not present

## 2016-02-25 DIAGNOSIS — N186 End stage renal disease: Secondary | ICD-10-CM | POA: Diagnosis not present

## 2016-02-25 DIAGNOSIS — D509 Iron deficiency anemia, unspecified: Secondary | ICD-10-CM | POA: Diagnosis not present

## 2016-02-27 DIAGNOSIS — N186 End stage renal disease: Secondary | ICD-10-CM | POA: Diagnosis not present

## 2016-02-27 DIAGNOSIS — I2782 Chronic pulmonary embolism: Secondary | ICD-10-CM | POA: Diagnosis not present

## 2016-02-27 DIAGNOSIS — E1129 Type 2 diabetes mellitus with other diabetic kidney complication: Secondary | ICD-10-CM | POA: Diagnosis not present

## 2016-02-27 DIAGNOSIS — E8779 Other fluid overload: Secondary | ICD-10-CM | POA: Diagnosis not present

## 2016-02-27 DIAGNOSIS — D631 Anemia in chronic kidney disease: Secondary | ICD-10-CM | POA: Diagnosis not present

## 2016-02-27 DIAGNOSIS — D509 Iron deficiency anemia, unspecified: Secondary | ICD-10-CM | POA: Diagnosis not present

## 2016-02-29 DIAGNOSIS — N186 End stage renal disease: Secondary | ICD-10-CM | POA: Diagnosis not present

## 2016-02-29 DIAGNOSIS — D631 Anemia in chronic kidney disease: Secondary | ICD-10-CM | POA: Diagnosis not present

## 2016-02-29 DIAGNOSIS — E1129 Type 2 diabetes mellitus with other diabetic kidney complication: Secondary | ICD-10-CM | POA: Diagnosis not present

## 2016-02-29 DIAGNOSIS — E8779 Other fluid overload: Secondary | ICD-10-CM | POA: Diagnosis not present

## 2016-02-29 DIAGNOSIS — D509 Iron deficiency anemia, unspecified: Secondary | ICD-10-CM | POA: Diagnosis not present

## 2016-03-01 ENCOUNTER — Emergency Department (HOSPITAL_COMMUNITY)
Admission: EM | Admit: 2016-03-01 | Discharge: 2016-03-01 | Disposition: A | Discharge status: Home / Self Care | Payer: Medicare Other | Attending: Emergency Medicine | Admitting: Emergency Medicine

## 2016-03-01 ENCOUNTER — Emergency Department (HOSPITAL_COMMUNITY): Payer: Medicare Other

## 2016-03-01 ENCOUNTER — Encounter (HOSPITAL_COMMUNITY): Payer: Self-pay | Admitting: Emergency Medicine

## 2016-03-01 DIAGNOSIS — Z86718 Personal history of other venous thrombosis and embolism: Secondary | ICD-10-CM

## 2016-03-01 DIAGNOSIS — Z79899 Other long term (current) drug therapy: Secondary | ICD-10-CM

## 2016-03-01 DIAGNOSIS — R0602 Shortness of breath: Secondary | ICD-10-CM | POA: Diagnosis not present

## 2016-03-01 DIAGNOSIS — Z7901 Long term (current) use of anticoagulants: Secondary | ICD-10-CM | POA: Insufficient documentation

## 2016-03-01 DIAGNOSIS — Z9981 Dependence on supplemental oxygen: Secondary | ICD-10-CM

## 2016-03-01 DIAGNOSIS — J9601 Acute respiratory failure with hypoxia: Secondary | ICD-10-CM | POA: Diagnosis present

## 2016-03-01 DIAGNOSIS — I110 Hypertensive heart disease with heart failure: Secondary | ICD-10-CM | POA: Diagnosis not present

## 2016-03-01 DIAGNOSIS — E1122 Type 2 diabetes mellitus with diabetic chronic kidney disease: Secondary | ICD-10-CM | POA: Diagnosis present

## 2016-03-01 DIAGNOSIS — E119 Type 2 diabetes mellitus without complications: Secondary | ICD-10-CM | POA: Insufficient documentation

## 2016-03-01 DIAGNOSIS — N186 End stage renal disease: Secondary | ICD-10-CM

## 2016-03-01 DIAGNOSIS — Z992 Dependence on renal dialysis: Secondary | ICD-10-CM | POA: Insufficient documentation

## 2016-03-01 DIAGNOSIS — R05 Cough: Secondary | ICD-10-CM | POA: Diagnosis not present

## 2016-03-01 DIAGNOSIS — I160 Hypertensive urgency: Secondary | ICD-10-CM | POA: Diagnosis present

## 2016-03-01 DIAGNOSIS — Z9119 Patient's noncompliance with other medical treatment and regimen: Secondary | ICD-10-CM

## 2016-03-01 DIAGNOSIS — Z794 Long term (current) use of insulin: Secondary | ICD-10-CM

## 2016-03-01 DIAGNOSIS — G4733 Obstructive sleep apnea (adult) (pediatric): Secondary | ICD-10-CM | POA: Diagnosis present

## 2016-03-01 DIAGNOSIS — K219 Gastro-esophageal reflux disease without esophagitis: Secondary | ICD-10-CM | POA: Diagnosis present

## 2016-03-01 DIAGNOSIS — I5042 Chronic combined systolic (congestive) and diastolic (congestive) heart failure: Secondary | ICD-10-CM | POA: Diagnosis not present

## 2016-03-01 DIAGNOSIS — I5033 Acute on chronic diastolic (congestive) heart failure: Principal | ICD-10-CM | POA: Diagnosis present

## 2016-03-01 DIAGNOSIS — E662 Morbid (severe) obesity with alveolar hypoventilation: Secondary | ICD-10-CM | POA: Diagnosis present

## 2016-03-01 DIAGNOSIS — E1310 Other specified diabetes mellitus with ketoacidosis without coma: Secondary | ICD-10-CM | POA: Diagnosis present

## 2016-03-01 DIAGNOSIS — J9602 Acute respiratory failure with hypercapnia: Secondary | ICD-10-CM | POA: Diagnosis present

## 2016-03-01 DIAGNOSIS — Z89021 Acquired absence of right finger(s): Secondary | ICD-10-CM

## 2016-03-01 DIAGNOSIS — M109 Gout, unspecified: Secondary | ICD-10-CM | POA: Diagnosis present

## 2016-03-01 DIAGNOSIS — Z9111 Patient's noncompliance with dietary regimen: Secondary | ICD-10-CM

## 2016-03-01 DIAGNOSIS — I1311 Hypertensive heart and chronic kidney disease without heart failure, with stage 5 chronic kidney disease, or end stage renal disease: Secondary | ICD-10-CM | POA: Insufficient documentation

## 2016-03-01 DIAGNOSIS — D649 Anemia, unspecified: Secondary | ICD-10-CM | POA: Diagnosis present

## 2016-03-01 DIAGNOSIS — N2581 Secondary hyperparathyroidism of renal origin: Secondary | ICD-10-CM | POA: Diagnosis present

## 2016-03-01 DIAGNOSIS — I132 Hypertensive heart and chronic kidney disease with heart failure and with stage 5 chronic kidney disease, or end stage renal disease: Secondary | ICD-10-CM | POA: Diagnosis present

## 2016-03-01 DIAGNOSIS — M1A3790 Chronic gout due to renal impairment, unspecified ankle and foot, without tophus (tophi): Secondary | ICD-10-CM

## 2016-03-01 DIAGNOSIS — I959 Hypotension, unspecified: Secondary | ICD-10-CM | POA: Diagnosis present

## 2016-03-01 DIAGNOSIS — G9340 Encephalopathy, unspecified: Secondary | ICD-10-CM | POA: Diagnosis present

## 2016-03-01 DIAGNOSIS — Z6841 Body Mass Index (BMI) 40.0 and over, adult: Secondary | ICD-10-CM

## 2016-03-01 DIAGNOSIS — I120 Hypertensive chronic kidney disease with stage 5 chronic kidney disease or end stage renal disease: Secondary | ICD-10-CM | POA: Diagnosis not present

## 2016-03-01 DIAGNOSIS — I2781 Cor pulmonale (chronic): Secondary | ICD-10-CM | POA: Diagnosis present

## 2016-03-01 LAB — CBC WITH DIFFERENTIAL/PLATELET
BASOS ABS: 0.1 10*3/uL (ref 0.0–0.1)
BASOS PCT: 1 %
Eosinophils Absolute: 0.3 10*3/uL (ref 0.0–0.7)
Eosinophils Relative: 4 %
HEMATOCRIT: 38.9 % — AB (ref 39.0–52.0)
HEMOGLOBIN: 12.1 g/dL — AB (ref 13.0–17.0)
LYMPHS PCT: 29 %
Lymphs Abs: 2 10*3/uL (ref 0.7–4.0)
MCH: 32.3 pg (ref 26.0–34.0)
MCHC: 31.1 g/dL (ref 30.0–36.0)
MCV: 103.7 fL — AB (ref 78.0–100.0)
Monocytes Absolute: 0.9 10*3/uL (ref 0.1–1.0)
Monocytes Relative: 12 %
NEUTROS ABS: 3.8 10*3/uL (ref 1.7–7.7)
NEUTROS PCT: 54 %
Platelets: 228 10*3/uL (ref 150–400)
RBC: 3.75 MIL/uL — AB (ref 4.22–5.81)
RDW: 16.6 % — ABNORMAL HIGH (ref 11.5–15.5)
WBC: 7 10*3/uL (ref 4.0–10.5)

## 2016-03-01 LAB — BASIC METABOLIC PANEL
ANION GAP: 12 (ref 5–15)
BUN: 57 mg/dL — ABNORMAL HIGH (ref 6–20)
CO2: 25 mmol/L (ref 22–32)
Calcium: 7.8 mg/dL — ABNORMAL LOW (ref 8.9–10.3)
Chloride: 98 mmol/L — ABNORMAL LOW (ref 101–111)
Creatinine, Ser: 10.2 mg/dL — ABNORMAL HIGH (ref 0.61–1.24)
GFR calc Af Amer: 6 mL/min — ABNORMAL LOW (ref >60)
GFR, EST NON AFRICAN AMERICAN: 5 mL/min — AB (ref >60)
GLUCOSE: 189 mg/dL — AB (ref 65–99)
POTASSIUM: 4.1 mmol/L (ref 3.5–5.1)
Sodium: 135 mmol/L (ref 135–145)

## 2016-03-01 LAB — URIC ACID: Uric Acid, Serum: 5.2 mg/dL (ref 4.4–7.6)

## 2016-03-01 LAB — PROTIME-INR
INR: 1.32 (ref 0.00–1.49)
PROTHROMBIN TIME: 16.5 s — AB (ref 11.6–15.2)

## 2016-03-01 MED ORDER — OXYCODONE HCL 10 MG PO TABS: 10.0000 mg | ORAL_TABLET | Freq: Two times a day (BID) | ORAL | Status: DC | PRN

## 2016-03-01 MED ORDER — OXYCODONE-ACETAMINOPHEN 5-325 MG PO TABS
2.0000 | ORAL_TABLET | Freq: Once | ORAL | Status: AC
  Administered 2016-03-01: 2 via ORAL
  Filled 2016-03-01: qty 2

## 2016-03-01 NOTE — ED Notes (Signed)
Patient presents for bilateral foot pain x1 week. Patient reports subjective swelling. No relief with gabapentin at home. Normally ambulates without walker or cane. Describes pain as "walking on needles". Rates pain 10/10.

## 2016-03-01 NOTE — Discharge Instructions (Signed)
Dialysis Diet Dialysis is a treatment that you may undergo if you have significant damage to your kidneys. Dialysis replaces some of the work that the kidneys do. One of the jobs that it takes over is removing wastes, salt, and extra water from your blood. This helps to keep the amount of potassium and other nutrients in your blood at healthy levels. When you need dialysis, it is important to pay careful attention to your diet. Between dialysis sessions, certain nutrients can build up in your blood and cause you to get sick. Vitamins and minerals are an important part of a healthy diet and should not be avoided entirely. However, it is commonly recommended that you limit your intake of potassium, phosphorus, and sodium. It may also be necessary to restrict other nutrients, such as carbohydrates or fat, if you have other health conditions. Your health care provider or dietitian can help you to determine the amount of these nutrients that is right for you. WHAT IS MY PLAN? Your dietitian will help you to design a meal plan that is specific to your needs. Generally, meal plans include:  Grains, 6-11 servings per day. One serving is equal to 1 slice of bread or  cup of cooked rice or pasta.  Low-potassium vegetables, 2-3 servings per day. One serving is equal to  cup.  Low-potassium fruits, 2-3 servings per day. One serving is equal to  cup.  Meats and other protein sources, 8-11 oz per day.  Dairy,  cup per day. Your dietitian will provide you with specific instructions about the amount of fluids you can have each day. WHAT DO I NEED TO KNOW ABOUT A DIALYSIS DIET?  Limit your intake of potassium. Potassium is found in milk, fruits, and vegetables.  Limit your intake of phosphorus. Phosphorus is found in milk, cheese, beans, nuts, and carbonated beverages. Avoid whole-grain and high-fiber foods because they contain high amounts of phosphorus.  Limit your intake of sodium. Foods that are high in  sodium include processed and cured meats, ready-made frozen meals, canned vegetables, and salty snack foods. Do not use salt substitutes because they contain potassium.  If you were instructed to restrict your fluid intake, follow your health care provider's specific instructions. You may be told to:  Write down what you drink and any foods you eat that are made mostly from water, such as gelatin and soups.  Drink from small cups to help control how much you drink.  Ask your health care provider if you should regularly take an over-the-counter medicine that binds phosphorus, such as antacid products that contain calcium carbonate.  Take vitamin and mineral supplements only as directed by your health care provider.  Eat high-quality proteins, such as meat, poultry, fish, and eggs. Limit low-quality plant-based proteins, such as nuts and beans.  Cut potatoes into small pieces and boil them in unsalted water before you eat them. This can help to remove some potassium from the potato.  Drain all fluid from cooked vegetables and canned fruits before eating them. WHAT FOODS CAN I EAT? Grains White bread. White rice. Cooked cereal. Unsalted popcorn. Tortillas. Pasta. Vegetables Fresh or frozen broccoli, carrots, and green beans. Cabbage. Cauliflower. Celery. Cucumbers. Eggplant. Radishes. Zucchini. Fruits Apples. Fresh or frozen berries. Fresh or canned pears, peaches, and pineapple. Grapes. Plums. Meats and Other Protein Sources Fresh or frozen beef, pork, chicken, and fish. Eggs. Low-sodium canned tuna or salmon. Dairy Cream cheese. Heavy cream. Ricotta cheese. Beverages Apple cider. Cranberry juice. Grape juice. Lemonade.  Black coffee. Condiments Herbs. Spices. Jam and jelly. Honey. Sweets and Desserts Sherbet. Cakes. Cookies. Fats and Oils Olive oil, canola oil, and safflower oil. Other Non-dairy creamer. Non-dairy whipped topping. Homemade broth without salt. The items listed  above may not be a complete list of recommended foods or beverages. Contact your dietitian for more options.  WHAT FOODS ARE NOT RECOMMENDED? Grains Whole-grain bread. Whole-grain pasta. High-fiber cereal. Vegetables Potatoes. Beets. Tomatoes. Winter squash and pumpkin. Asparagus. Spinach. Parsnips. Fruits Star fruit. Bananas. Oranges. Kiwi. Nectarines. Prunes. Melon. Dried fruit. Avocado. Meats and Other Protein Sources Canned, smoked, and cured meats. Occupational hygienistackaged luncheon meat. Sardines. Nuts and seeds. Peanut butter. Beans and legumes. Dairy Milk. Buttermilk. Yogurt. Cheese and cottage cheese. Processed cheese spreads. Beverages Orange juice. Prune juice. Carbonated soft drinks. Condiments Salt. Salt substitutes. Soy sauce. Sweets and Desserts Ice cream. Chocolate. Candied nuts. Fats and Oils Butter. Margarine. Other Ready-made frozen meals. Canned soups. The items listed above may not be a complete list of foods and beverages to avoid. Contact your dietitian for more information.   This information is not intended to replace advice given to you by your health care provider. Make sure you discuss any questions you have with your health care provider.   Document Released: 06/18/2004 Document Revised: 10/12/2014 Document Reviewed: 04/24/2014 Elsevier Interactive Patient Education 2016 Elsevier Inc.  Gout Gout is when your joints become red, sore, and swell (inflamed). This is caused by the buildup of uric acid crystals in the joints. Uric acid is a chemical that is normally in the blood. If the level of uric acid gets too high in the blood, these crystals form in your joints and tissues. Over time, these crystals can form into masses near the joints and tissues. These masses can destroy bone and cause the bone to look misshapen (deformed). HOME CARE   Do not take aspirin for pain.  Only take medicine as told by your doctor.  Rest the joint as much as you can. When in bed, keep  sheets and blankets off painful areas.  Keep the sore joints raised (elevated).  Put warm or cold packs on painful joints. Use of warm or cold packs depends on which works best for you.  Use crutches if the painful joint is in your leg.  Drink enough fluids to keep your pee (urine) clear or pale yellow. Limit alcohol, sugary drinks, and drinks with fructose in them.  Follow your diet instructions. Pay careful attention to how much protein you eat. Include fruits, vegetables, whole grains, and fat-free or low-fat milk products in your daily diet. Talk to your doctor or dietitian about the use of coffee, vitamin C, and cherries. These may help lower uric acid levels.  Keep a healthy body weight. GET HELP RIGHT AWAY IF:   You have watery poop (diarrhea), throw up (vomit), or have any side effects from medicines.  You do not feel better in 24 hours, or you are getting worse.  Your joint becomes suddenly more tender, and you have chills or a fever. MAKE SURE YOU:   Understand these instructions.  Will watch your condition.  Will get help right away if you are not doing well or get worse.   This information is not intended to replace advice given to you by your health care provider. Make sure you discuss any questions you have with your health care provider.   Document Released: 06/30/2008 Document Revised: 10/12/2014 Document Reviewed: 05/04/2012 Elsevier Interactive Patient Education [employer] Elsevier Inc.  End-Stage Kidney Disease The kidneys are two organs that lie on either side of the spine between the middle of the back and the front of the abdomen. The kidneys:   Remove wastes and extra water from the blood.   Produce important hormones. These help keep bones strong, regulate blood pressure, and help create red blood cells.   Balance the fluids and chemicals in the blood and tissues. End-stage kidney disease occurs when the kidneys are so damaged that they cannot do their  job. When the kidneys cannot do their job, life-threatening problems occur. The body cannot stay clean and strong without the help of the kidneys. In end-stage kidney disease, the kidneys cannot get better.You need a new kidney or treatments to do some of the work healthy kidneys do in order to stay alive. CAUSES  End-stage kidney disease usually occurs when a long-lasting (chronic) kidney disease gets worse. It may also occur after the kidneys are suddenly damaged (acute kidney injury).  SYMPTOMS   Swelling (edema) of the legs, ankles, or feet.   Tiredness (lethargy).   Nausea or vomiting.   Confusion.   Problems with urination, such as:   Decreased urine production.   Frequent urination, especially at night.   Frequent accidents in children who are potty trained.   Muscle twitches and cramps.   Persistent itchiness.   Loss of appetite.   Headaches.   Abnormally dark or light skin.   Numbness in the hands or feet.   Easy bruising.   Frequent hiccups.   Menstruation stops. DIAGNOSIS  Your health care provider will measure your blood pressure and take some tests. These may include:   Urine tests.   Blood tests.   Imaging tests, such as:   An ultrasound exam.   Computed tomography (CT).  A kidney biopsy. TREATMENT  There are two treatments for end-stage kidney disease:   A procedure that removes toxic wastes from the body (dialysis).   Receiving a new kidney (kidney transplant). Both of these treatments have serious risks and consequences. Your health care provider will help you determine which treatment is best for you based on your health, age, and other factors. In addition to having dialysis or a kidney transplant, you may need to take medicines to control high blood pressure (hypertension) and cholesterol and to decrease phosphorus levels in your blood.  HOME CARE INSTRUCTIONS  Follow your prescribed diet.   Take medicines only  as directed by your health care provider.   Do not take any new medicines (prescription, over-the-counter, or nutritional supplements) unless approved by your health care provider. Many medicines can worsen your kidney damage or need to have the dose adjusted.   Keep all follow-up visits as directed by your health care provider. MAKE SURE YOU:  Understand these instructions.  Will watch your condition.  Will get help right away if you are not doing well or get worse.   This information is not intended to replace advice given to you by your health care provider. Make sure you discuss any questions you have with your health care provider.   Document Released: 12/12/2003 Document Revised: 10/12/2014 Document Reviewed: 05/20/2012 Elsevier Interactive Patient Education [employer]

## 2016-03-01 NOTE — ED Provider Notes (Signed)
CSN: [id number]     Arrival date & time 03/01/16  1550 History   First MD Initiated Contact with Patient 03/01/16 1608     Chief Complaint  Patient presents with  . Foot Pain      HPI Patient presents for bilateral foot pain x1 week. Patient reports subjective swelling. No relief with gabapentin at home. Normally ambulates without walker or cane. Describes pain as "walking on needles". Rates pain 10/10.Patient is on hemodialysis and has dialysis tomorrow.  Patient states he is no more short of breath than he normally is.  She has oxygen at home.  Denies any fever chills.  He is out of his pain medicine at home. Past Medical History  Diagnosis Date  . Type 2 diabetes mellitus, uncontrolled (HCC)        . Morbid obesity (HCC)   . OSA (obstructive sleep apnea)   . Septic shock(785.52)   . Anemia   . Hypertension   . Gout   . LOC (loss of consciousness)   . DVT (deep venous thrombosis) (HCC) 2011  . Cardiomegaly   . Chronic anticoagulation   . Chronic pancreatitis (HCC)   . Secondary hyperparathyroidism (HCC)   . Pneumonia   . ESRD (end stage renal disease) (HCC)     T/Th/Sa  . Kidney stones   . GERD (gastroesophageal reflux disease)   . Seizures (HCC)     pt denies  . Headache     migraines  . Shortness of breath dyspnea     with exertion   Past Surgical History  Procedure Laterality Date  . Av fistula placement    . Amputation finger / thumb Right   . I&d extremity Left 06/20/2015    Procedure: IRRIGATION AND DEBRIDEMENT EXTREMITY;  Surgeon: Renford Dills, MD;  Location: [facility] ORS;  Service: Vascular;  Laterality: Left;  . Application of wound vac Left 06/20/2015    Procedure: APPLICATION OF WOUND VAC;  Surgeon: Renford Dills, MD;  Location: [facility] ORS;  Service: Vascular;  Laterality: Left;  . Av fistula placement Left 06/20/2015    Procedure: ARTERIOVENOUS (AV) FISTULA  ligation , excision;  Surgeon: Renford Dills, MD;  Location: [facility] ORS;  Service: Vascular;   Laterality: Left;  . Peripheral vascular catheterization N/A 06/25/2015    Procedure: Dialysis/Perma Catheter Insertion;  Surgeon: Annice Needy, MD;  Location: [facility] INVASIVE CV LAB;  Service: Cardiovascular;  Laterality: N/A;  . Insertion of dialysis catheter N/A 06/25/2015    Procedure: place a tunnelled catheter for long term use;  Surgeon: Annice Needy, MD;  Location: [facility] ORS;  Service: Vascular;  Laterality: N/A;  . Insertion of dialysis catheter Left 07/25/2015    Procedure: INSERTION OF LEFT INTERNAL JUGULAR DIALYSIS CATHETER;  Surgeon: Pryor Ochoa, MD;  Location: [facility] OR;  Service: Vascular;  Laterality: Left;  . Insertion of dialysis catheter Left 08/01/2015    Procedure: REMOVAL OF DIALYSIS CATHETER; PLACEMNET OF NEW DIALYSIS CATHETER;  Surgeon: Pryor Ochoa, MD;  Location: [facility] OR;  Service: Vascular;  Laterality: Left;  . Peripheral vascular catheterization Bilateral 09/13/2015    Procedure: Upper Extremity Venography;  Surgeon: Sherren Kerns, MD;  Location: [facility] INVASIVE CV LAB;  Service: Cardiovascular;  Laterality: Bilateral;  . Exchange of a dialysis catheter Left 11/13/2015    Procedure: EXCHANGE OF A DIALYSIS CATHETER- LEFT INTERNAL JUGULAR;  Surgeon: Nada Libman, MD;  Location: [facility] OR;  Service: Vascular;  Laterality: Left;   Family History  Problem  Relation Age of Onset  . Diabetes Mother   . Hypertension Mother   . Diabetes Father   . Hypertension Father   . Hypertension Brother    Social History  Substance Use Topics  . Smoking status: Never Smoker   . Smokeless tobacco: Never Used  . Alcohol Use: No    Review of Systems  All other systems reviewed and are negative.     Allergies  Aspirin  Home Medications   Prior to Admission medications   Medication Sig Start Date End Date Taking? Authorizing Provider  allopurinol (ZYLOPRIM) 300 MG tablet Take 300 mg by mouth 2 (two) times daily.    Historical Provider, MD  atorvastatin (LIPITOR) 10 MG tablet Take 10  mg by mouth daily.    Historical Provider, MD  BD PEN NEEDLE NANO U/F 32G X 4 MM MISC  07/12/15   Historical Provider, MD  calcium acetate (PHOSLO) 667 MG capsule Take 2,001 mg by mouth 3 (three) times daily with meals.     Historical Provider, MD  carvedilol (COREG) 3.125 MG tablet Take 1 tablet (3.125 mg total) by mouth 2 (two) times daily with a meal. 06/26/15   Altamese DillingVaibhavkumar Vachhani, MD  cinacalcet (SENSIPAR) 60 MG tablet Take 60 mg by mouth daily.    Historical Provider, MD  clonazePAM (KLONOPIN) 1 MG tablet Take 1 mg by mouth 2 (two) times daily.    Historical Provider, MD  colchicine 0.6 MG tablet Take 0.6 mg by mouth daily.    Historical Provider, MD  gabapentin (NEURONTIN) 300 MG capsule Take 600 mg by mouth every 8 (eight) hours.     Historical Provider, MD  insulin aspart protamine- aspart (NOVOLOG MIX 70/30) (70-30) 100 UNIT/ML injection Inject 0.1 mLs (10 Units total) into the skin 3 (three) times daily with meals. Patient taking differently: Inject 35 Units into the skin 3 (three) times daily with meals.  06/26/15   Altamese DillingVaibhavkumar Vachhani, MD  insulin glargine (LANTUS) 100 UNIT/ML injection Inject 0.35 mLs (35 Units total) into the skin at bedtime. Patient taking differently: Inject 70 Units into the skin at bedtime.  06/26/15   Altamese DillingVaibhavkumar Vachhani, MD  lanthanum (FOSRENOL) 1000 MG chewable tablet Chew 1,000 mg by mouth 3 (three) times daily with meals.     Historical Provider, MD  multivitamin (RENA-VIT) TABS tablet Take 1 tablet by mouth daily.  12/05/14   Historical Provider, MD  omeprazole (PRILOSEC) 20 MG capsule Take 20 mg by mouth 2 (two) times daily before a meal.     Historical Provider, MD  Oxycodone HCl 10 MG TABS Take 1 tablet (10 mg total) by mouth 2 (two) times daily as needed (pain). 03/01/16   Nelva Nayobert Sarah Zerby, MD  warfarin (COUMADIN) 7.5 MG tablet Take 7.5 mg by mouth See admin instructions. Pt takes 7.5mg  on Tuesday, Wednesday, Thursday, Saturday and Sunday and 10mg  on Monday  and Friday    Historical Provider, MD   BP 129/57 mmHg  Pulse 114  Temp(Src) 98.5 F (36.9 C) (Oral)  Resp 20  SpO2 92% Physical Exam  Constitutional: He is oriented to person, place, and time. He appears well-developed and well-nourished. No distress.  HENT:  Head: Normocephalic and atraumatic.  Eyes: Pupils are equal, round, and reactive to light.  Neck: Normal range of motion.  Cardiovascular: Normal rate and intact distal pulses.   Pulmonary/Chest: No respiratory distress. He has rales (Scattered crackles and rales at both bases.).  Abdominal: Soft. Normal appearance and bowel sounds are normal. He exhibits  no distension.  Musculoskeletal: He exhibits edema (Bilateral lower extremities.) and tenderness.       Right foot: There is decreased range of motion, tenderness and swelling.       Left foot: There is decreased range of motion, tenderness and swelling.  Neurological: He is alert and oriented to person, place, and time. No cranial nerve deficit.  Skin: Skin is warm and dry. No rash noted.  Psychiatric: He has a normal mood and affect. His behavior is normal.  Nursing note and vitals reviewed.   ED Course  Procedures (including critical care time) Labs Review Labs Reviewed  BASIC METABOLIC PANEL - Abnormal; Notable for the following:    Chloride 98 (*)    Glucose, Bld 189 (*)    BUN 57 (*)    Creatinine, Ser 10.20 (*)    Calcium 7.8 (*)    GFR calc non Af Amer 5 (*)    GFR calc Af Amer 6 (*)    All other components within normal limits  CBC WITH DIFFERENTIAL/PLATELET - Abnormal; Notable for the following:    RBC 3.75 (*)    Hemoglobin 12.1 (*)    HCT 38.9 (*)    MCV 103.7 (*)    RDW 16.6 (*)    All other components within normal limits  PROTIME-INR - Abnormal; Notable for the following:    Prothrombin Time 16.5 (*)    All other components within normal limits  URIC ACID        MDM   Final diagnoses:  ESRD (end stage renal disease) (HCC)  Chronic  combined systolic and diastolic congestive heart failure (HCC)  Chronic gout due to renal impairment involving foot without tophus, unspecified laterality        Nelva Nay, MD 03/01/16 [phone]

## 2016-03-01 NOTE — ED Notes (Signed)
Placed 2L [place] back on patient. Asked patient to place both legs in bed, so this writer may raise bed rails because patient appears lethargic. Patient refuses, "I've been doing this for 40 something years".

## 2016-03-01 NOTE — ED Notes (Signed)
Attempted to place pt on 2L O2 as a result of SpO2 being 88%, pt refused. Nasal cannula placed next to pt and instructed to please put it on.

## 2016-03-01 NOTE — ED Notes (Signed)
Patient is refusing to wear [place]. Patient states "I'm going to pull it out".

## 2016-03-02 DIAGNOSIS — D509 Iron deficiency anemia, unspecified: Secondary | ICD-10-CM | POA: Diagnosis not present

## 2016-03-02 DIAGNOSIS — N186 End stage renal disease: Secondary | ICD-10-CM | POA: Diagnosis not present

## 2016-03-02 DIAGNOSIS — D631 Anemia in chronic kidney disease: Secondary | ICD-10-CM | POA: Diagnosis not present

## 2016-03-02 DIAGNOSIS — E1129 Type 2 diabetes mellitus with other diabetic kidney complication: Secondary | ICD-10-CM | POA: Diagnosis not present

## 2016-03-02 DIAGNOSIS — E8779 Other fluid overload: Secondary | ICD-10-CM | POA: Diagnosis not present

## 2016-03-03 ENCOUNTER — Encounter (HOSPITAL_COMMUNITY): Payer: Self-pay | Admitting: *Deleted

## 2016-03-03 DIAGNOSIS — D631 Anemia in chronic kidney disease: Secondary | ICD-10-CM | POA: Diagnosis not present

## 2016-03-03 DIAGNOSIS — N186 End stage renal disease: Secondary | ICD-10-CM | POA: Diagnosis not present

## 2016-03-03 DIAGNOSIS — E8779 Other fluid overload: Secondary | ICD-10-CM | POA: Diagnosis not present

## 2016-03-03 DIAGNOSIS — E1129 Type 2 diabetes mellitus with other diabetic kidney complication: Secondary | ICD-10-CM | POA: Diagnosis not present

## 2016-03-03 DIAGNOSIS — D509 Iron deficiency anemia, unspecified: Secondary | ICD-10-CM | POA: Diagnosis not present

## 2016-03-04 ENCOUNTER — Encounter (HOSPITAL_COMMUNITY): Payer: Self-pay | Admitting: Anesthesiology

## 2016-03-04 ENCOUNTER — Encounter (HOSPITAL_COMMUNITY): Payer: Self-pay | Admitting: *Deleted

## 2016-03-04 ENCOUNTER — Encounter (HOSPITAL_COMMUNITY)
Admission: RE | Disposition: A | Discharge status: Home / Self Care | Payer: Self-pay | Source: Ambulatory Visit | Attending: Pulmonary Disease

## 2016-03-04 ENCOUNTER — Emergency Department (HOSPITAL_COMMUNITY): Payer: Medicare Other

## 2016-03-04 ENCOUNTER — Inpatient Hospital Stay (HOSPITAL_COMMUNITY)
Admission: RE | Admit: 2016-03-04 | Discharge: 2016-03-05 | DRG: 291 | Disposition: A | Discharge status: Home / Self Care | Payer: Medicare Other | Source: Ambulatory Visit | Attending: Internal Medicine | Admitting: Internal Medicine

## 2016-03-04 ENCOUNTER — Inpatient Hospital Stay (HOSPITAL_COMMUNITY): Payer: Medicare Other

## 2016-03-04 DIAGNOSIS — Z86718 Personal history of other venous thrombosis and embolism: Secondary | ICD-10-CM | POA: Diagnosis not present

## 2016-03-04 DIAGNOSIS — Z9911 Dependence on respirator [ventilator] status: Secondary | ICD-10-CM | POA: Diagnosis not present

## 2016-03-04 DIAGNOSIS — G9340 Encephalopathy, unspecified: Secondary | ICD-10-CM | POA: Insufficient documentation

## 2016-03-04 DIAGNOSIS — E1310 Other specified diabetes mellitus with ketoacidosis without coma: Secondary | ICD-10-CM | POA: Diagnosis present

## 2016-03-04 DIAGNOSIS — Z79899 Other long term (current) drug therapy: Secondary | ICD-10-CM | POA: Diagnosis not present

## 2016-03-04 DIAGNOSIS — I132 Hypertensive heart and chronic kidney disease with heart failure and with stage 5 chronic kidney disease, or end stage renal disease: Secondary | ICD-10-CM | POA: Diagnosis present

## 2016-03-04 DIAGNOSIS — Z7901 Long term (current) use of anticoagulants: Secondary | ICD-10-CM

## 2016-03-04 DIAGNOSIS — Z6841 Body Mass Index (BMI) 40.0 and over, adult: Secondary | ICD-10-CM | POA: Diagnosis not present

## 2016-03-04 DIAGNOSIS — I5033 Acute on chronic diastolic (congestive) heart failure: Secondary | ICD-10-CM | POA: Diagnosis not present

## 2016-03-04 DIAGNOSIS — N186 End stage renal disease: Secondary | ICD-10-CM | POA: Diagnosis present

## 2016-03-04 DIAGNOSIS — Z9111 Patient's noncompliance with dietary regimen: Secondary | ICD-10-CM | POA: Diagnosis not present

## 2016-03-04 DIAGNOSIS — D649 Anemia, unspecified: Secondary | ICD-10-CM | POA: Diagnosis present

## 2016-03-04 DIAGNOSIS — I2781 Cor pulmonale (chronic): Secondary | ICD-10-CM | POA: Diagnosis present

## 2016-03-04 DIAGNOSIS — E1122 Type 2 diabetes mellitus with diabetic chronic kidney disease: Secondary | ICD-10-CM | POA: Diagnosis present

## 2016-03-04 DIAGNOSIS — Z4682 Encounter for fitting and adjustment of non-vascular catheter: Secondary | ICD-10-CM | POA: Diagnosis not present

## 2016-03-04 DIAGNOSIS — Z01818 Encounter for other preprocedural examination: Secondary | ICD-10-CM

## 2016-03-04 DIAGNOSIS — E1129 Type 2 diabetes mellitus with other diabetic kidney complication: Secondary | ICD-10-CM | POA: Diagnosis not present

## 2016-03-04 DIAGNOSIS — M109 Gout, unspecified: Secondary | ICD-10-CM

## 2016-03-04 DIAGNOSIS — J811 Chronic pulmonary edema: Secondary | ICD-10-CM | POA: Diagnosis not present

## 2016-03-04 DIAGNOSIS — J810 Acute pulmonary edema: Secondary | ICD-10-CM | POA: Diagnosis not present

## 2016-03-04 DIAGNOSIS — E118 Type 2 diabetes mellitus with unspecified complications: Secondary | ICD-10-CM | POA: Diagnosis not present

## 2016-03-04 DIAGNOSIS — E1165 Type 2 diabetes mellitus with hyperglycemia: Secondary | ICD-10-CM | POA: Diagnosis present

## 2016-03-04 DIAGNOSIS — K219 Gastro-esophageal reflux disease without esophagitis: Secondary | ICD-10-CM | POA: Diagnosis present

## 2016-03-04 DIAGNOSIS — J9600 Acute respiratory failure, unspecified whether with hypoxia or hypercapnia: Secondary | ICD-10-CM | POA: Diagnosis present

## 2016-03-04 DIAGNOSIS — J9602 Acute respiratory failure with hypercapnia: Secondary | ICD-10-CM | POA: Diagnosis not present

## 2016-03-04 DIAGNOSIS — Z794 Long term (current) use of insulin: Secondary | ICD-10-CM | POA: Diagnosis not present

## 2016-03-04 DIAGNOSIS — D631 Anemia in chronic kidney disease: Secondary | ICD-10-CM | POA: Diagnosis not present

## 2016-03-04 DIAGNOSIS — G4733 Obstructive sleep apnea (adult) (pediatric): Secondary | ICD-10-CM | POA: Diagnosis not present

## 2016-03-04 DIAGNOSIS — N2581 Secondary hyperparathyroidism of renal origin: Secondary | ICD-10-CM | POA: Diagnosis present

## 2016-03-04 DIAGNOSIS — IMO0002 Reserved for concepts with insufficient information to code with codable children: Secondary | ICD-10-CM | POA: Diagnosis present

## 2016-03-04 DIAGNOSIS — I82409 Acute embolism and thrombosis of unspecified deep veins of unspecified lower extremity: Secondary | ICD-10-CM | POA: Diagnosis present

## 2016-03-04 DIAGNOSIS — J9601 Acute respiratory failure with hypoxia: Secondary | ICD-10-CM | POA: Diagnosis not present

## 2016-03-04 DIAGNOSIS — Z992 Dependence on renal dialysis: Secondary | ICD-10-CM | POA: Diagnosis not present

## 2016-03-04 DIAGNOSIS — I959 Hypotension, unspecified: Secondary | ICD-10-CM | POA: Diagnosis present

## 2016-03-04 DIAGNOSIS — I160 Hypertensive urgency: Secondary | ICD-10-CM | POA: Diagnosis present

## 2016-03-04 DIAGNOSIS — R0902 Hypoxemia: Secondary | ICD-10-CM | POA: Diagnosis present

## 2016-03-04 DIAGNOSIS — I120 Hypertensive chronic kidney disease with stage 5 chronic kidney disease or end stage renal disease: Secondary | ICD-10-CM | POA: Diagnosis not present

## 2016-03-04 DIAGNOSIS — Z9119 Patient's noncompliance with other medical treatment and regimen: Secondary | ICD-10-CM | POA: Diagnosis not present

## 2016-03-04 DIAGNOSIS — R0602 Shortness of breath: Secondary | ICD-10-CM | POA: Diagnosis not present

## 2016-03-04 DIAGNOSIS — Z9981 Dependence on supplemental oxygen: Secondary | ICD-10-CM | POA: Diagnosis not present

## 2016-03-04 DIAGNOSIS — E662 Morbid (severe) obesity with alveolar hypoventilation: Secondary | ICD-10-CM | POA: Diagnosis present

## 2016-03-04 DIAGNOSIS — Z89021 Acquired absence of right finger(s): Secondary | ICD-10-CM | POA: Diagnosis not present

## 2016-03-04 LAB — CBC
HEMATOCRIT: 37 % — AB (ref 39.0–52.0)
HEMOGLOBIN: 11.2 g/dL — AB (ref 13.0–17.0)
MCH: 31.6 pg (ref 26.0–34.0)
MCHC: 30.3 g/dL (ref 30.0–36.0)
MCV: 104.5 fL — AB (ref 78.0–100.0)
Platelets: 223 10*3/uL (ref 150–400)
RBC: 3.54 MIL/uL — AB (ref 4.22–5.81)
RDW: 16.8 % — AB (ref 11.5–15.5)
WBC: 7.8 10*3/uL (ref 4.0–10.5)

## 2016-03-04 LAB — I-STAT VENOUS BLOOD GAS, ED
Acid-base deficit: 3 mmol/L — ABNORMAL HIGH (ref 0.0–2.0)
Acid-base deficit: 3 mmol/L — ABNORMAL HIGH (ref 0.0–2.0)
BICARBONATE: 26.7 meq/L — AB (ref 20.0–24.0)
Bicarbonate: 24.7 mEq/L — ABNORMAL HIGH (ref 20.0–24.0)
O2 SAT: 77 %
O2 Saturation: 79 %
PCO2 VEN: 56 mmHg — AB (ref 45.0–50.0)
PCO2 VEN: 68.1 mmHg — AB (ref 45.0–50.0)
PH VEN: 7.252 (ref 7.250–7.300)
PH VEN: 7.201 — AB (ref 7.250–7.300)
PO2 VEN: 51 mmHg — AB (ref 31.0–45.0)
PO2 VEN: 52 mmHg — AB (ref 31.0–45.0)
TCO2: 26 mmol/L (ref 0–100)
TCO2: 29 mmol/L (ref 0–100)

## 2016-03-04 LAB — CBG MONITORING, ED: Glucose-Capillary: 468 mg/dL — ABNORMAL HIGH (ref 65–99)

## 2016-03-04 LAB — CBC WITH DIFFERENTIAL/PLATELET
Basophils Absolute: 0 10*3/uL (ref 0.0–0.1)
Basophils Relative: 1 %
EOS ABS: 0.2 10*3/uL (ref 0.0–0.7)
Eosinophils Relative: 4 %
HEMATOCRIT: 36 % — AB (ref 39.0–52.0)
HEMOGLOBIN: 10.7 g/dL — AB (ref 13.0–17.0)
LYMPHS ABS: 1.7 10*3/uL (ref 0.7–4.0)
LYMPHS PCT: 24 %
MCH: 31.2 pg (ref 26.0–34.0)
MCHC: 29.7 g/dL — AB (ref 30.0–36.0)
MCV: 105 fL — AB (ref 78.0–100.0)
MONOS PCT: 14 %
Monocytes Absolute: 0.9 10*3/uL (ref 0.1–1.0)
NEUTROS PCT: 57 %
Neutro Abs: 4 10*3/uL (ref 1.7–7.7)
Platelets: 223 10*3/uL (ref 150–400)
RBC: 3.43 MIL/uL — ABNORMAL LOW (ref 4.22–5.81)
RDW: 16.7 % — ABNORMAL HIGH (ref 11.5–15.5)
WBC: 6.8 10*3/uL (ref 4.0–10.5)

## 2016-03-04 LAB — BASIC METABOLIC PANEL
Anion gap: 13 (ref 5–15)
BUN: 51 mg/dL — AB (ref 6–20)
CHLORIDE: 94 mmol/L — AB (ref 101–111)
CO2: 22 mmol/L (ref 22–32)
CREATININE: 8.73 mg/dL — AB (ref 0.61–1.24)
Calcium: 8.6 mg/dL — ABNORMAL LOW (ref 8.9–10.3)
GFR calc Af Amer: 7 mL/min — ABNORMAL LOW (ref >60)
GFR calc non Af Amer: 6 mL/min — ABNORMAL LOW (ref >60)
GLUCOSE: 482 mg/dL — AB (ref 65–99)
Potassium: 4.5 mmol/L (ref 3.5–5.1)
SODIUM: 129 mmol/L — AB (ref 135–145)

## 2016-03-04 LAB — COMPREHENSIVE METABOLIC PANEL
ALT: 46 U/L (ref 17–63)
AST: 42 U/L — AB (ref 15–41)
Albumin: 3.3 g/dL — ABNORMAL LOW (ref 3.5–5.0)
Alkaline Phosphatase: 157 U/L — ABNORMAL HIGH (ref 38–126)
Anion gap: 12 (ref 5–15)
BILIRUBIN TOTAL: 1 mg/dL (ref 0.3–1.2)
BUN: 55 mg/dL — AB (ref 6–20)
CHLORIDE: 94 mmol/L — AB (ref 101–111)
CO2: 24 mmol/L (ref 22–32)
CREATININE: 9.42 mg/dL — AB (ref 0.61–1.24)
Calcium: 8.2 mg/dL — ABNORMAL LOW (ref 8.9–10.3)
GFR calc Af Amer: 7 mL/min — ABNORMAL LOW (ref >60)
GFR, EST NON AFRICAN AMERICAN: 6 mL/min — AB (ref >60)
Glucose, Bld: 451 mg/dL — ABNORMAL HIGH (ref 65–99)
Potassium: 5.1 mmol/L (ref 3.5–5.1)
Sodium: 130 mmol/L — ABNORMAL LOW (ref 135–145)
Total Protein: 7.7 g/dL (ref 6.5–8.1)

## 2016-03-04 LAB — POCT I-STAT 4, (NA,K, GLUC, HGB,HCT)
Glucose, Bld: 488 mg/dL — ABNORMAL HIGH (ref 65–99)
HCT: 41 % (ref 39.0–52.0)
HEMOGLOBIN: 13.9 g/dL (ref 13.0–17.0)
POTASSIUM: 4.4 mmol/L (ref 3.5–5.1)
SODIUM: 132 mmol/L — AB (ref 135–145)

## 2016-03-04 LAB — I-STAT ARTERIAL BLOOD GAS, ED
ACID-BASE DEFICIT: 3 mmol/L — AB (ref 0.0–2.0)
ACID-BASE DEFICIT: 6 mmol/L — AB (ref 0.0–2.0)
BICARBONATE: 22.2 meq/L (ref 20.0–24.0)
Bicarbonate: 25.7 mEq/L — ABNORMAL HIGH (ref 20.0–24.0)
O2 Saturation: 99 %
O2 Saturation: 86 %
PH ART: 7.375 (ref 7.350–7.450)
PO2 ART: 140 mmHg — AB (ref 80.0–100.0)
Patient temperature: 98.1
TCO2: 23 mmol/L (ref 0–100)
TCO2: 28 mmol/L (ref 0–100)
pCO2 arterial: 37.9 mmHg (ref 35.0–45.0)
pCO2 arterial: 80.4 mmHg (ref 35.0–45.0)
pH, Arterial: 7.111 — CL (ref 7.350–7.450)
pO2, Arterial: 70 mmHg — ABNORMAL LOW (ref 80.0–100.0)

## 2016-03-04 LAB — I-STAT CG4 LACTIC ACID, ED: Lactic Acid, Venous: 1.04 mmol/L (ref 0.5–2.0)

## 2016-03-04 LAB — POCT I-STAT 3, ART BLOOD GAS (G3+)
Acid-base deficit: 2 mmol/L (ref 0.0–2.0)
Bicarbonate: 25.3 mEq/L — ABNORMAL HIGH (ref 20.0–24.0)
O2 SAT: 99 %
PCO2 ART: 53.8 mmHg — AB (ref 35.0–45.0)
PH ART: 7.281 — AB (ref 7.350–7.450)
PO2 ART: 148 mmHg — AB (ref 80.0–100.0)
Patient temperature: 98.7
TCO2: 27 mmol/L (ref 0–100)

## 2016-03-04 LAB — GLUCOSE, CAPILLARY
GLUCOSE-CAPILLARY: 427 mg/dL — AB (ref 65–99)
GLUCOSE-CAPILLARY: 339 mg/dL — AB (ref 65–99)

## 2016-03-04 LAB — PROTIME-INR
INR: 1.34 (ref 0.00–1.49)
INR: 1.36 (ref 0.00–1.49)
PROTHROMBIN TIME: 16.7 s — AB (ref 11.6–15.2)
Prothrombin Time: 16.8 seconds — ABNORMAL HIGH (ref 11.6–15.2)

## 2016-03-04 LAB — MAGNESIUM: MAGNESIUM: 2 mg/dL (ref 1.7–2.4)

## 2016-03-04 LAB — BETA-HYDROXYBUTYRIC ACID: BETA-HYDROXYBUTYRIC ACID: 0.09 mmol/L (ref 0.05–0.27)

## 2016-03-04 LAB — APTT: aPTT: 32 seconds (ref 24–37)

## 2016-03-04 LAB — LACTIC ACID, PLASMA: Lactic Acid, Venous: 1.4 mmol/L (ref 0.5–2.0)

## 2016-03-04 LAB — PHOSPHORUS: Phosphorus: 7.1 mg/dL — ABNORMAL HIGH (ref 2.5–4.6)

## 2016-03-04 LAB — MRSA PCR SCREENING: MRSA BY PCR: NEGATIVE

## 2016-03-04 SURGERY — INSERTION HYBRID ANTERIOVENOUS GORTEX GRAFT
Anesthesia: General | Site: Arm Upper | Laterality: Left

## 2016-03-04 MED ORDER — NALOXONE HCL 0.4 MG/ML IJ SOLN
0.4000 mg | Freq: Once | INTRAMUSCULAR | Status: AC
  Administered 2016-03-04: 0.4 mg via INTRAVENOUS

## 2016-03-04 MED ORDER — ATORVASTATIN CALCIUM 10 MG PO TABS
10.0000 mg | ORAL_TABLET | Freq: Every day | ORAL | Status: DC
  Administered 2016-03-04 – 2016-03-05 (×2): 10 mg via ORAL
  Filled 2016-03-04 (×2): qty 1

## 2016-03-04 MED ORDER — FENTANYL BOLUS VIA INFUSION
50.0000 ug | INTRAVENOUS | Status: DC | PRN
  Administered 2016-03-04: 25 ug via INTRAVENOUS
  Filled 2016-03-04: qty 50

## 2016-03-04 MED ORDER — NALOXONE HCL 0.4 MG/ML IJ SOLN
INTRAMUSCULAR | Status: AC
  Filled 2016-03-04: qty 1

## 2016-03-04 MED ORDER — DOXERCALCIFEROL 4 MCG/2ML IV SOLN: 7.0000 ug | INTRAVENOUS | Status: DC

## 2016-03-04 MED ORDER — CALCIUM ACETATE (PHOS BINDER) 667 MG PO CAPS
2001.0000 mg | ORAL_CAPSULE | Freq: Three times a day (TID) | ORAL | Status: DC
  Administered 2016-03-04 – 2016-03-05 (×3): 2001 mg via ORAL
  Filled 2016-03-04 (×7): qty 3

## 2016-03-04 MED ORDER — ALLOPURINOL 300 MG PO TABS
300.0000 mg | ORAL_TABLET | Freq: Two times a day (BID) | ORAL | Status: DC
  Administered 2016-03-04 – 2016-03-05 (×3): 300 mg via ORAL
  Filled 2016-03-04 (×3): qty 1

## 2016-03-04 MED ORDER — DOXERCALCIFEROL 4 MCG/2ML IV SOLN
7.0000 ug | INTRAVENOUS | Status: DC
  Filled 2016-03-04: qty 4

## 2016-03-04 MED ORDER — ETOMIDATE 2 MG/ML IV SOLN
INTRAVENOUS | Status: DC | PRN
  Administered 2016-03-04: 60 mg via INTRAVENOUS

## 2016-03-04 MED ORDER — SODIUM CHLORIDE 0.9 % IV SOLN
INTRAVENOUS | Status: DC
  Administered 2016-03-04: 3.7 [IU]/h via INTRAVENOUS
  Filled 2016-03-04: qty 2.5

## 2016-03-04 MED ORDER — MIDAZOLAM HCL 2 MG/2ML IJ SOLN
INTRAMUSCULAR | Status: AC
  Filled 2016-03-04: qty 4

## 2016-03-04 MED ORDER — MIDAZOLAM HCL 2 MG/2ML IJ SOLN: 2.0000 mg | INTRAMUSCULAR | Status: DC | PRN

## 2016-03-04 MED ORDER — SODIUM CHLORIDE 0.9 % IV SOLN
25.0000 ug/h | INTRAVENOUS | Status: DC
  Filled 2016-03-04: qty 50

## 2016-03-04 MED ORDER — FENTANYL CITRATE (PF) 100 MCG/2ML IJ SOLN
INTRAMUSCULAR | Status: AC
Start: 2016-03-04 — End: 2016-03-05
  Filled 2016-03-04: qty 2

## 2016-03-04 MED ORDER — ALBUMIN HUMAN 25 % IV SOLN
25.0000 g | Freq: Once | INTRAVENOUS | Status: AC
  Administered 2016-03-04: 25 g via INTRAVENOUS
  Filled 2016-03-04: qty 50

## 2016-03-04 MED ORDER — CINACALCET HCL 30 MG PO TABS
60.0000 mg | ORAL_TABLET | Freq: Every day | ORAL | Status: DC
  Administered 2016-03-04 – 2016-03-05 (×2): 60 mg via ORAL
  Filled 2016-03-04 (×2): qty 2

## 2016-03-04 MED ORDER — ROCURONIUM BROMIDE 50 MG/5ML IV SOLN
INTRAVENOUS | Status: DC | PRN
  Administered 2016-03-04: 120 mg via INTRAVENOUS

## 2016-03-04 MED ORDER — ATORVASTATIN CALCIUM 10 MG PO TABS: 10.0000 mg | ORAL_TABLET | Freq: Every day | ORAL | Status: DC

## 2016-03-04 MED ORDER — FENTANYL CITRATE (PF) 100 MCG/2ML IJ SOLN
25.0000 ug | INTRAMUSCULAR | Status: DC | PRN
Start: 2016-03-04 — End: 2016-03-05

## 2016-03-04 MED ORDER — WARFARIN SODIUM 10 MG PO TABS
10.0000 mg | ORAL_TABLET | Freq: Once | ORAL | Status: AC
  Administered 2016-03-04: 10 mg via ORAL
  Filled 2016-03-04 (×2): qty 1

## 2016-03-04 MED ORDER — PANTOPRAZOLE SODIUM 40 MG IV SOLR
40.0000 mg | INTRAVENOUS | Status: DC
  Administered 2016-03-04: 40 mg via INTRAVENOUS
  Filled 2016-03-04: qty 40

## 2016-03-04 MED ORDER — FENTANYL CITRATE (PF) 100 MCG/2ML IJ SOLN
INTRAMUSCULAR | Status: DC | PRN
  Administered 2016-03-04: 100 ug via INTRAVENOUS

## 2016-03-04 MED ORDER — SODIUM CHLORIDE 0.9 % IV SOLN: 250.0000 mL | INTRAVENOUS | Status: DC | PRN

## 2016-03-04 MED ORDER — DOCUSATE SODIUM 50 MG/5ML PO LIQD
100.0000 mg | Freq: Two times a day (BID) | ORAL | Status: DC | PRN
  Filled 2016-03-04: qty 10

## 2016-03-04 MED ORDER — CHLORHEXIDINE GLUCONATE 0.12% ORAL RINSE (MEDLINE KIT)
15.0000 mL | Freq: Two times a day (BID) | OROMUCOSAL | Status: DC
  Administered 2016-03-04 – 2016-03-05 (×2): 15 mL via OROMUCOSAL

## 2016-03-04 MED ORDER — ANTISEPTIC ORAL RINSE SOLUTION (CORINZ)
7.0000 mL | OROMUCOSAL | Status: DC
  Administered 2016-03-04 – 2016-03-05 (×6): 7 mL via OROMUCOSAL

## 2016-03-04 MED ORDER — FENTANYL CITRATE (PF) 100 MCG/2ML IJ SOLN: 50.0000 ug | Freq: Once | INTRAMUSCULAR | Status: DC

## 2016-03-04 MED ORDER — PROPOFOL 10 MG/ML IV BOLUS
INTRAVENOUS | Status: AC
  Filled 2016-03-04: qty 20

## 2016-03-04 MED ORDER — CARVEDILOL 3.125 MG PO TABS: 3.1250 mg | ORAL_TABLET | Freq: Two times a day (BID) | ORAL | Status: DC

## 2016-03-04 MED ORDER — CARVEDILOL 3.125 MG PO TABS
3.1250 mg | ORAL_TABLET | Freq: Two times a day (BID) | ORAL | Status: DC
  Administered 2016-03-04 – 2016-03-05 (×2): 3.125 mg via ORAL
  Filled 2016-03-04 (×2): qty 1

## 2016-03-04 MED ORDER — FENTANYL CITRATE (PF) 250 MCG/5ML IJ SOLN
INTRAMUSCULAR | Status: AC
  Filled 2016-03-04: qty 5

## 2016-03-04 MED ORDER — FENTANYL BOLUS VIA INFUSION
50.0000 ug | INTRAVENOUS | Status: DC | PRN
  Administered 2016-03-04: 50 ug via INTRAVENOUS
  Filled 2016-03-04: qty 50

## 2016-03-04 MED ORDER — FENTANYL CITRATE (PF) 100 MCG/2ML IJ SOLN
50.0000 ug | Freq: Once | INTRAMUSCULAR | Status: AC
  Administered 2016-03-04: 50 ug via INTRAVENOUS
  Filled 2016-03-04: qty 2

## 2016-03-04 MED ORDER — SODIUM CHLORIDE 0.9 % IV SOLN
25.0000 ug/h | INTRAVENOUS | Status: DC
  Administered 2016-03-04: 50 ug/h via INTRAVENOUS
  Filled 2016-03-04: qty 50

## 2016-03-04 MED ORDER — WARFARIN - PHARMACIST DOSING INPATIENT: Freq: Every day | Status: DC

## 2016-03-04 MED ORDER — MIDAZOLAM HCL 2 MG/2ML IJ SOLN
INTRAMUSCULAR | Status: AC
  Filled 2016-03-04: qty 2

## 2016-03-04 NOTE — ED Notes (Signed)
O2 continues to drop to 70s when pt sleeping, when aroused increases to 90s.  MD Criss AlvineGoldston aware, Bipap ordered.

## 2016-03-04 NOTE — Progress Notes (Signed)
ANTICOAGULATION CONSULT NOTE - Initial Consult  Pharmacy Consult for warfarin Indication: hx DVT  Allergies  Allergen Reactions  . Aspirin Other (See Comments)    Reaction:  GI upset     Patient Measurements:    Vital Signs: Temp: 98.1 F (36.7 C) (05/31 0816) Temp Source: Oral (05/31 0816) BP: 144/65 mmHg (05/31 1445) Pulse Rate: 96 (05/31 1445)  Labs:  Recent Labs  03/01/16 1634 03/04/16 0717 03/04/16 0734 03/04/16 0851  HGB 12.1*  --  13.9 11.2*  HCT 38.9*  --  41.0 37.0*  PLT 228  --   --  223  LABPROT 16.5* 16.7*  --   --   INR 1.32 1.34  --   --   CREATININE 10.20*  --   --  8.73*    CrCl cannot be calculated (Unknown ideal weight.).   Medical History: Past Medical History  Diagnosis Date  . Type 2 diabetes mellitus, uncontrolled (HCC)        . Morbid obesity (HCC)   . OSA (obstructive sleep apnea)   . Septic shock(785.52)   . Anemia   . Hypertension   . Gout   . LOC (loss of consciousness)   . DVT (deep venous thrombosis) (HCC) 2011  . Cardiomegaly   . Chronic anticoagulation   . Chronic pancreatitis (HCC)   . Secondary hyperparathyroidism (HCC)   . Pneumonia   . GERD (gastroesophageal reflux disease)   . Seizures (HCC)     pt denies  . Headache     migraines  . Shortness of breath dyspnea     with exertion  . ESRD (end stage renal disease) (HCC)     [place]  . Kidney stones    Assessment: [age] yom presented to the ED with hypoxia requiring intubation in the ED. He is on chronic coumadin for history of DVT. INR is subtherapeutic at 1.34. No bleeding noted.   Goal of Therapy:  INR 2-3 Monitor platelets by anticoagulation protocol: Yes   Plan:  - Warfarin 10mg  PO x 1 tonight - Daily INR  Richrd Kuzniar, Drake Leachachel Lynn 03/04/2016,3:20 PM

## 2016-03-04 NOTE — ED Notes (Signed)
Nephrology at bedside

## 2016-03-04 NOTE — ED Notes (Signed)
MD Merrell at bedside. 

## 2016-03-04 NOTE — Progress Notes (Signed)
[facility]  Patient still on BiPAP at this time.

## 2016-03-04 NOTE — Procedures (Signed)
Intubation Procedure Note Shane MinaReginald D Hamilton [id number] 08/13/1966  Procedure: Intubation Indications: Respiratory insufficiency  Procedure Details Consent: Risks of procedure as well as the alternatives and risks of each were explained to the (patient/caregiver).  Consent for procedure obtained. Time Out: Verified patient identification, verified procedure, site/side was marked, verified correct patient position, special equipment/implants available, medications/allergies/relevent history reviewed, required imaging and test results available.  Performed  Maximum sterile technique was used including antiseptics, cap, gloves, hand hygiene and mask.  MAC and 4    Evaluation Hemodynamic Status: BP stable throughout; O2 sats: stable throughout Patient's Current Condition: stable Complications: No apparent complications Patient did tolerate procedure well. Chest X-ray ordered to verify placement.  CXR: pending.   Shane Hamilton 03/04/2016

## 2016-03-04 NOTE — ED Notes (Signed)
Preparing for intubation at this time.  

## 2016-03-04 NOTE — Consult Note (Signed)
PULMONARY / CRITICAL CARE MEDICINE   Name: Shane Hamilton MRN: [id number] DOB: 1965/12/11    ADMISSION DATE:  03/04/2016   CONSULTATION DATE:  03/04/16  REFERRING MD:  Pricilla Loveless MD  CHIEF COMPLAINT:  Hypoxia and Hyperglycemia  HISTORY OF PRESENT ILLNESS:   Shane Hamilton is a 50 y.o. male with medical history significant for OSA with morbid obesity, DM2, CHF, h/o DVT on chronic coumadin, ESRD on HD, brought today from short stay due to O2 desat to 75 % in RA, and increased lethargy as he was to undergo AVG revision. His wife reports that he was more alert this morning prior to arrival to short stay, but did have increasing shortness of breath since last Sunday, undergoing HD x2 on Monday and Tuesday. History is obtained by wife, who is a good historian: No apparent rhinorrhea or hemoptysis. No frothy sputum. Nenies fevers, chills, night sweats. No chest pain, chest wall pain or palpitations. No abdominal pain. No apparent nausea orvomiting, dizziness or vertigo. Reports lowerextremity swelling. He is confused . Denies any vision changes, double vision or headaches.Denies any sick contacts or recent long distance travels. Patinet had not taken his meds this morning, including his insulin in preparation to the procedure. He was transferred to the ED for further evaluation. He was then placed on BiPap, CO2 continued to rise with no improvement in mental status. He was intubated by PCCM in the ED.    PAST MEDICAL HISTORY :  He  has a past medical history of Type 2 diabetes mellitus, uncontrolled (HCC); Morbid obesity (HCC); OSA (obstructive sleep apnea); Septic shock(785.52); Anemia; Hypertension; Gout; LOC (loss of consciousness); DVT (deep venous thrombosis) (HCC) (2011); Cardiomegaly; Chronic anticoagulation; Chronic pancreatitis (HCC); Secondary hyperparathyroidism (HCC); Pneumonia; GERD (gastroesophageal reflux disease); Seizures (HCC); Headache; Shortness of breath dyspnea; ESRD (end  stage renal disease) (HCC); and Kidney stones.  PAST SURGICAL HISTORY: He  has past surgical history that includes AV fistula placement; Amputation finger / thumb (Right); I&D extremity (Left, 06/20/2015); Application if wound vac (Left, 06/20/2015); AV fistula placement (Left, 06/20/2015); Cardiac catheterization (N/A, 06/25/2015); Insertion of dialysis catheter (N/A, 06/25/2015); Insertion of dialysis catheter (Left, 07/25/2015); Insertion of dialysis catheter (Left, 08/01/2015); Cardiac catheterization (Bilateral, 09/13/2015); and Exchange of a dialysis catheter (Left, 11/13/2015).  Allergies  Allergen Reactions  . Aspirin Other (See Comments)    Reaction:  GI upset     Current Facility-Administered Medications on File Prior to Encounter  Medication  . cefUROXime (ZINACEF) 1.5 g in dextrose 5 % 50 mL IVPB   Current Outpatient Prescriptions on File Prior to Encounter  Medication Sig  . allopurinol (ZYLOPRIM) 300 MG tablet Take 300 mg by mouth 2 (two) times daily.  Marland Kitchen atorvastatin (LIPITOR) 10 MG tablet Take 10 mg by mouth daily.  . calcium acetate (PHOSLO) 667 MG capsule Take 2,001 mg by mouth 3 (three) times daily with meals.   . gabapentin (NEURONTIN) 300 MG capsule Take 600 mg by mouth every 8 (eight) hours.   . insulin aspart protamine- aspart (NOVOLOG MIX 70/30) (70-30) 100 UNIT/ML injection Inject 0.1 mLs (10 Units total) into the skin 3 (three) times daily with meals. (Patient taking differently: Inject 35 Units into the skin 3 (three) times daily with meals. )  . insulin glargine (LANTUS) 100 UNIT/ML injection Inject 0.35 mLs (35 Units total) into the skin at bedtime. (Patient taking differently: Inject 70 Units into the skin at bedtime. )  . multivitamin (RENA-VIT) TABS tablet Take 1 tablet by mouth  daily.   . omeprazole (PRILOSEC) 20 MG capsule Take 20 mg by mouth 2 (two) times daily before a meal.   . warfarin (COUMADIN) 7.5 MG tablet Take 7.5 mg by mouth See admin instructions. Pt takes  7.5mg  on Tuesday, Wednesday, Thursday, Saturday and Sunday and 10mg  on Monday and Friday  . BD PEN NEEDLE NANO U/F 32G X 4 MM MISC   . carvedilol (COREG) 3.125 MG tablet Take 1 tablet (3.125 mg total) by mouth 2 (two) times daily with a meal.  . cinacalcet (SENSIPAR) 60 MG tablet Take 60 mg by mouth daily.  . clonazePAM (KLONOPIN) 1 MG tablet Take 1 mg by mouth 2 (two) times daily.  . colchicine 0.6 MG tablet Take 0.6 mg by mouth daily.  Marland Kitchen lanthanum (FOSRENOL) 1000 MG chewable tablet Chew 1,000 mg by mouth 3 (three) times daily with meals.     FAMILY HISTORY:  His indicated that his mother is alive. He indicated that his father is alive. He indicated that his maternal grandmother is deceased. He indicated that his maternal grandfather is deceased. He indicated that his paternal grandmother is deceased. He indicated that his paternal grandfather is deceased.   SOCIAL HISTORY: He  reports that he has never smoked. He has never used smokeless tobacco. He reports that he does not drink alcohol or use illicit drugs.  REVIEW OF SYSTEMS:   Negative except HPI  SUBJECTIVE:  50 yo AAM, admitted for acute hypercarbic respiratory failure. He presented initially to short stay for revision of AV fistula. On admission he was noted to have sats 75%. He was then transferred to the ED for evaluation. A short course of BiPap was tried with worsening hypercarbia and he remained obtunded. He was subsequently intubated in the ED.   VITAL SIGNS: BP 138/67 mmHg  Pulse 110  Temp(Src) 98.1 F (36.7 C) (Oral)  Resp 22  SpO2 95%  HEMODYNAMICS:    VENTILATOR SETTINGS: Vent Mode:  [-]  FiO2 (%):  [100 %] 100 % Set Rate:  [20 bmp] 20 bmp Vt Set:  [560 mL] 560 mL PEEP:  [8 cmH20] 8 cmH20 Plateau Pressure:  [27 cmH20] 27 cmH20  INTAKE / OUTPUT:    PHYSICAL EXAMINATION: General:  Morbidly obese AAM. Obtunded in bed (GCS 5).  Neuro:  obtunded HEENT:  Moist mucous membranes. PERRL Cardiovascular:  Normal  rate and rhythm, unable to determine JVD 2/2 body habitus, No edema noted Lungs:  Diminished but equal throughout Abdomen: Soft and non-tender, morbidly obese Musculoskeletal: UTA due to mental status Skin: No issues  LABS:  BMET  Recent Labs Lab 03/01/16 1634 03/04/16 0734 03/04/16 0851  NA 135 132* 129*  K 4.1 4.4 4.5  CL 98*  --  94*  CO2 25  --  22  BUN 57*  --  51*  CREATININE 10.20*  --  8.73*  GLUCOSE 189* 488* 482*    Electrolytes  Recent Labs Lab 03/01/16 1634 03/04/16 0851  CALCIUM 7.8* 8.6*    CBC  Recent Labs Lab 03/01/16 1634 03/04/16 0734 03/04/16 0851  WBC 7.0  --  7.8  HGB 12.1* 13.9 11.2*  HCT 38.9* 41.0 37.0*  PLT 228  --  223    Coag's  Recent Labs Lab 03/01/16 1634 03/04/16 0717  INR 1.32 1.34    Sepsis Markers No results for input(s): LATICACIDVEN, PROCALCITON, O2SATVEN in the last 168 hours.  ABG  Recent Labs Lab 03/04/16 0910 03/04/16 1218  PHART 7.375 7.111*  PCO2ART 37.9  80.4*  PO2ART 140.0* 70.0*    Liver Enzymes No results for input(s): AST, ALT, ALKPHOS, BILITOT, ALBUMIN in the last 168 hours.  Cardiac Enzymes No results for input(s): TROPONINI, PROBNP in the last 168 hours.  Glucose  Recent Labs Lab 03/04/16 0848  GLUCAP 468*    Imaging Dg Chest 2 View  03/04/2016  CLINICAL DATA:  Hypoxia. EXAM: CHEST  2 VIEW COMPARISON:  03/01/2016. FINDINGS: Dialysis catheter noted with tip projected over the lower right atrium. Cardiomegaly with the bilateral pulmonary infiltrates most likely secondary to pulmonary edema. No pleural effusion or pneumothorax . IMPRESSION: 1. Dialysis catheter noted with tip projected over the lower right atrium. 2. Cardiomegaly with bilateral pulmonary infiltrates consistent pulmonary edema. Electronically Signed   By: Maisie Fushomas  Register   On: 03/04/2016 10:35   Dg Chest Port 1 View  03/04/2016  CLINICAL DATA:  Endotracheal tube placement. EXAM: PORTABLE CHEST 1 VIEW COMPARISON:  Same  day. FINDINGS: Stable cardiomegaly. Nasogastric tube tip is seen in proximal stomach. Left internal jugular catheter is unchanged in position with tip projected over right atrium. Endotracheal tube is projected over tracheal air shadow with distal tip 4.5 cm above the carina. Hypoinflation of the lungs is noted. No pneumothorax or pleural effusion is noted. No acute pulmonary disease is noted. IMPRESSION: Endotracheal and nasogastric tubes are in grossly good position. Left internal jugular catheter is unchanged in position with tip projected over right atrium. Hypoinflation the lungs is noted without definite acute pulmonary disease. Electronically Signed   By: Lupita RaiderJames  Green Jr, M.D.   On: 03/04/2016 13:36   Dg Abd Portable 1v  03/04/2016  CLINICAL DATA:  Nasogastric tube placement EXAM: PORTABLE ABDOMEN - 1 VIEW COMPARISON:  None. FINDINGS: Nasogastric tube tip and side port are in the stomach. Bowel gas pattern is unremarkable. There is consolidation in the medial left lung base. No free air evident. IMPRESSION: Nasogastric tube tip and side port in stomach. Bowel gas pattern unremarkable. Consolidation left base. Electronically Signed   By: Bretta BangWilliam  Woodruff III M.D.   On: 03/04/2016 13:34   STUDIES:  none  CULTURES: none  ANTIBIOTICS: none  SIGNIFICANT EVENTS: 03/04/16: Intubated  LINES/TUBES: Left IJ Vas Cath ETT 5/31>>>  DISCUSSION:  Acute hypoxic and hypercarbic respiratory failure in setting of what is likely under treated/untreated OHS/OSA w/ resultant acute on chronic diastolic HF and decompensated cor pulmonale. Failed BIPAP. Now intubated. Will admit to ICU, HD ordered. Hope w/ volume removal his MS will improve.    ASSESSMENT / PLAN:  PULMONARY A: Acute hypercarbic and hypoxic respiratory failure in setting of what is likely decompensated diastolic HF and cor pulmonale due un-treated OSA/OHS P:   full vent settings Wean FIO2/peep Chlorhexidine for  VAP  prevention Education needed prior to discharge concerning non-compliance of CPAP F/U CXR and ABG  CARDIOVASCULAR A:  Acute on chronic diastolic dysfunction  Hypertensive Urgency P:  Repeat ECHO for evaluation of diastolic function Maintain normotension  - restart coreg 3.125 mg BID approx 5 liters removed over the last 2 days with HD  - Renal consulted for HD today  RENAL A:   ESRD Gout P:   Renal consulted for HD today No electrolyte protocols 2/2 ESRD Continue home Allopurinol, Phoslo, Sensipar Continue allopurinol Holding colchicine   GASTROINTESTINAL A:   NPO P:   Nutrition consult for TF recommendations Colace BID for bowel protocol   HEMATOLOGIC A:   DVT Anemia P:  Goal INR 2-3 Pharmacy consult for coumadin titration Transfuse  for HgB < 7 No signs of active bleeding  INFECTIOUS A:   No active issue No leukocytosis Afebrile P:   Pan culture with fever > 101.5 No indication for abx at this time   ENDOCRINE A:   Hyperglycemia  BS on admission 488 P:   Insulin gtt started for protocol HgBA1C pending Beta-Hydroxybuterate negative   NEUROLOGIC A:   Acute encephalopathy  P:   RASS goal -1 Most likely due to hypercarbia Continue to monitor neuro status   Simonne Martinet ACNP-BC [facility] Pulmonary/Critical Care Pager # [phone] OR # [phone] if no answer   03/04/2016, 12:11 PM   PCCM Attending Note: Patient seen and examined with nurse practitioner. Please refer to her note above which I reviewed in detail. 50 year old male with underlying OSA and morbid obesity. Presented to emergency department with hypoxia. Found to have diabetic ketoacidosis as well. Respiratory and mental status continued to deteriorate despite BiPAP support and maximizing VQ mismatching. Patient  intubated for hypoxia and worsening respiratory status. Wife at bedside and updated. Initiating DKA protocol. Plan for hemodialysis today by nephrology.  I have spent a total of  37 minutes of critical care time aside from time spent during procedures caring for the patient, updating the patient's wife at bedside, and reviewing the patient's electronic medical record.  Donna Christen Jamison Neighbor, M.D. [facility] Pulmonary & Critical Care Pager:  [phone] After 3pm or if no response, call [phone] 4:57 PM 03/04/2016

## 2016-03-04 NOTE — Progress Notes (Signed)
Agiatted. No order for fent gtt but patient on it  Plan Pad protocol with fent gtt  Dr. Kalman ShanMurali Zephyra Bernardi, M.D., [facility] Pulmonary and Critical Care Medicine Staff Physician [facility] Pulmonary and Critical Care Pager: [phone], If no answer or between  15:00h - 7:00h: call [phone]  03/04/2016 6:12 PM

## 2016-03-04 NOTE — Progress Notes (Signed)
[facility].

## 2016-03-04 NOTE — ED Notes (Signed)
Pt continues to be sleepy and lethargic-will arouse to voice.   Bipap on at this time.  Family at bedside.

## 2016-03-04 NOTE — ED Notes (Signed)
Pt states he needs to leave and go to work, refusing to let IV team attempt IV, MD Criss AlvineGoldston at bedside.  Pt understands plan and agreeable for now.  IV team at bedside

## 2016-03-04 NOTE — ED Notes (Signed)
RT at bedside.  CCM at bedside

## 2016-03-04 NOTE — ED Notes (Signed)
Admitting PA at bedside speaking with family.   PA aware of elevated CBG as well as possibly needing a SDU bed d/t BIpap-PA to speak with MD Konrad DoloresMerrell. RN on 6E aware.

## 2016-03-04 NOTE — Progress Notes (Signed)
Patient's O2 sats 75% on room air at rest.  Patient placed on 2L nasal canula and sitting up in bed, sats improved to 95%.  Patient states that he does not want to wear the oxygen and that he will take it off.  Wife at bedside and encouraging patient to wear oxygen.  Patient appears to be sleepy and not wanting to answer questions.   Patient's blood sugar 488. Dr. Sampson GoonFitzgerald and Dr. Imogene Burnhen made aware of patient's status and both stated that patient needs to go to the ED.

## 2016-03-04 NOTE — Consult Note (Signed)
Medical Consultation   Shane Hamilton  WJX:[id number]  DOB: 08/12/1966  DOA: 03/04/2016  PCP: Darrow BussingKOIRALA,DIBAS, MD   Reason for consultation: Acute Respiratory Failure and Acute Encephalopathy    History of Present Illness: Shane Hamilton is an 50 y.o. male  with medical history significant for OSA with morbid obesity, DM2, CHF, h/o DVT on chronic coumadin, ESRD on HD, brought today from short stay due to O desat to 75 % in RA, and increased lethargy  as he was to undergo AVG revision. His wife reports that he was more alert this morning prior to arrival to short stay, but did have increasing shortness of breath since last Sunday, undergoing HD x2 on Monday and Tuesday. History is obtained by wife, who is a good historian:  No apparent rhinorrhea or hemoptysis. No frothy sputum. Nenies fevers, chills, night sweats. No chest pain, chest wall pain or palpitations. No abdominal pain. No apparent nausea or  Vomiting, dizziness or vertigo. Reports lower  extremity swelling. He is confused . Denies any vision changes, double vision or headaches.Denies any sick contacts or recent long distance travels. [place] had not taken his meds this morning, including his insulin in preparation to the procedure.  was placed on Bipap due to increased difficulty with breathing and lethargy.  ED Course: CXR Cardiomegaly with the bilateral pulmonary infiltrates mostlikely secondary to pulmonary edema. No pleural effusion orpneumothorax .  BP 138/67 mmHg  Pulse 110  Temp(Src) 98.1 F (36.7 C) (Oral)  Resp 22  SpO2 95%  Na 129, Cl 94, Bicarb 24.7, WBC normal at 7, unremarkable  BS was 488, corrected at 138 with anion gap 22 suggestive of DKA.  CCM has seen the patient and due to worsening respiratory status, he was intubated by CCM.  He is going to be admitted to CCU be Critical Care Medicine.   Review of Systems:  As per HPI otherwise 10 point review of systems negative.     Past Medical  History: Past Medical History  Diagnosis Date  . Type 2 diabetes mellitus, uncontrolled (HCC)        . Morbid obesity (HCC)   . OSA (obstructive sleep apnea)   . Septic shock(785.52)   . Anemia   . Hypertension   . Gout   . LOC (loss of consciousness)   . DVT (deep venous thrombosis) (HCC) 2011  . Cardiomegaly   . Chronic anticoagulation   . Chronic pancreatitis (HCC)   . Secondary hyperparathyroidism (HCC)   . Pneumonia   . GERD (gastroesophageal reflux disease)   . Seizures (HCC)     pt denies  . Headache     migraines  . Shortness of breath dyspnea     with exertion  . ESRD (end stage renal disease) (HCC)     [facility] [place]  . Kidney stones     Past Surgical History: Past Surgical History  Procedure Laterality Date  . Av fistula placement    . Amputation finger / thumb Right   . I&d extremity Left 06/20/2015    Procedure: IRRIGATION AND DEBRIDEMENT EXTREMITY;  Surgeon: Renford DillsGregory G Schnier, MD;  Location: [facility] ORS;  Service: Vascular;  Laterality: Left;  . Application of wound vac Left 06/20/2015    Procedure: APPLICATION OF WOUND VAC;  Surgeon: Renford DillsGregory G Schnier, MD;  Location: [facility] ORS;  Service: Vascular;  Laterality: Left;  . Av fistula placement Left 06/20/2015  Procedure: ARTERIOVENOUS (AV) FISTULA  ligation , excision;  Surgeon: Renford Dills, MD;  Location: [facility] ORS;  Service: Vascular;  Laterality: Left;  . Peripheral vascular catheterization N/A 06/25/2015    Procedure: Dialysis/Perma Catheter Insertion;  Surgeon: Annice Needy, MD;  Location: [facility] INVASIVE CV LAB;  Service: Cardiovascular;  Laterality: N/A;  . Insertion of dialysis catheter N/A 06/25/2015    Procedure: place a tunnelled catheter for long term use;  Surgeon: Annice Needy, MD;  Location: [facility] ORS;  Service: Vascular;  Laterality: N/A;  . Insertion of dialysis catheter Left 07/25/2015    Procedure: INSERTION OF LEFT INTERNAL JUGULAR DIALYSIS CATHETER;  Surgeon: Pryor Ochoa, MD;  Location:  [facility] OR;  Service: Vascular;  Laterality: Left;  . Insertion of dialysis catheter Left 08/01/2015    Procedure: REMOVAL OF DIALYSIS CATHETER; PLACEMNET OF NEW DIALYSIS CATHETER;  Surgeon: Pryor Ochoa, MD;  Location: [facility] OR;  Service: Vascular;  Laterality: Left;  . Peripheral vascular catheterization Bilateral 09/13/2015    Procedure: Upper Extremity Venography;  Surgeon: Sherren Kerns, MD;  Location: [facility] INVASIVE CV LAB;  Service: Cardiovascular;  Laterality: Bilateral;  . Exchange of a dialysis catheter Left 11/13/2015    Procedure: EXCHANGE OF A DIALYSIS CATHETER- LEFT INTERNAL JUGULAR;  Surgeon: Nada Libman, MD;  Location: [facility] OR;  Service: Vascular;  Laterality: Left;     Allergies:   Allergies  Allergen Reactions  . Aspirin Other (See Comments)    Reaction:  GI upset      Social History:  reports that he has never smoked. He has never used smokeless tobacco. He reports that he does not drink alcohol or use illicit drugs. walks unassisted walks with cane with walker Patient  is on wheelchair   Family History: Family History  Problem Relation Age of Onset  . Diabetes Mother   . Hypertension Mother   . Diabetes Father   . Hypertension Father   . Hypertension Brother     Family history reviewed and not pertinent    Physical Exam: Filed Vitals:   03/04/16 1145 03/04/16 1200 03/04/16 1215 03/04/16 1230  BP: 164/81 138/67 141/59 134/76  Pulse: 104 110 106 101  Temp:      TempSrc:      Resp: 16 22 22 24   SpO2: 94% 95% 98% 87%      Labs:  CBC:  Recent Labs Lab 03/01/16 1634 03/04/16 0734 03/04/16 0851  WBC 7.0  --  7.8  NEUTROABS 3.8  --   --   HGB 12.1* 13.9 11.2*  HCT 38.9* 41.0 37.0*  MCV 103.7*  --  104.5*  PLT 228  --  223    Basic Metabolic Panel:  Recent Labs Lab 03/01/16 1634 03/04/16 0734 03/04/16 0851  NA 135 132* 129*  K 4.1 4.4 4.5  CL 98*  --  94*  CO2 25  --  22  GLUCOSE 189* 488* 482*  BUN 57*  --  51*  CREATININE 10.20*  --   8.73*  CALCIUM 7.8*  --  8.6*   GFR CrCl cannot be calculated (Unknown ideal weight.). Liver Function Tests: No results for input(s): AST, ALT, ALKPHOS, BILITOT, PROT, ALBUMIN in the last 168 hours. No results for input(s): LIPASE, AMYLASE in the last 168 hours. No results for input(s): AMMONIA in the last 168 hours. Coagulation profile  Recent Labs Lab 03/01/16 1634 03/04/16 0717  INR 1.32 1.34    Cardiac Enzymes: No results for input(s): CKTOTAL, CKMB,  CKMBINDEX, TROPONINI in the last 168 hours. BNP: Invalid input(s): POCBNP CBG:  Recent Labs Lab 03/04/16 0848  GLUCAP 468*   D-Dimer No results for input(s): DDIMER in the last 72 hours. Hgb A1c No results for input(s): HGBA1C in the last 72 hours. Lipid Profile No results for input(s): CHOL, HDL, LDLCALC, TRIG, CHOLHDL, LDLDIRECT in the last 72 hours. Thyroid function studies No results for input(s): TSH, T4TOTAL, T3FREE, THYROIDAB in the last 72 hours.  Invalid input(s): FREET3 Anemia work up No results for input(s): VITAMINB12, FOLATE, FERRITIN, TIBC, IRON, RETICCTPCT in the last 72 hours. Urinalysis    Component Value Date/Time   COLORURINE YELLOW 07/07/2010 1716   APPEARANCEUR CLEAR 07/07/2010 1716   LABSPEC 1.014 07/07/2010 1716   PHURINE 7.0 07/07/2010 1716   GLUCOSEU NEGATIVE 07/07/2010 1716   HGBUR SMALL* 07/07/2010 1716   BILIRUBINUR NEGATIVE 07/07/2010 1716   KETONESUR NEGATIVE 07/07/2010 1716   PROTEINUR 100* 07/07/2010 1716   UROBILINOGEN 0.2 07/07/2010 1716   NITRITE NEGATIVE 07/07/2010 1716   LEUKOCYTESUR NEGATIVE 07/07/2010 1716     Sepsis Labs Invalid input(s): PROCALCITONIN,  WBC,  LACTICIDVEN Microbiology No results found for this or any previous visit (from the past 240 hour(s)).     Inpatient Medications:   Scheduled Meds: . allopurinol  300 mg Oral BID  . atorvastatin  10 mg Oral Daily  . calcium acetate  2,001 mg Oral TID WC  . carvedilol  3.125 mg Oral BID WC  .  cinacalcet  60 mg Oral Daily  . [START ON 03/05/2016] doxercalciferol  7 mcg Intravenous Q T,Th,Sa-HD  . fentaNYL      . midazolam      . naloxone       Continuous Infusions: . sodium chloride       Radiological Exams on Admission: Dg Chest 2 View  03/04/2016  CLINICAL DATA:  Hypoxia. EXAM: CHEST  2 VIEW COMPARISON:  03/01/2016. FINDINGS: Dialysis catheter noted with tip projected over the lower right atrium. Cardiomegaly with the bilateral pulmonary infiltrates most likely secondary to pulmonary edema. No pleural effusion or pneumothorax . IMPRESSION: 1. Dialysis catheter noted with tip projected over the lower right atrium. 2. Cardiomegaly with bilateral pulmonary infiltrates consistent pulmonary edema. Electronically Signed   By: Maisie Fus  Register   On: 03/04/2016 10:35    Impression/Recommendations Principal Problem:   Acute respiratory failure with hypoxia (HCC) Active Problems:   Obstructive sleep apnea   ESRD (end stage renal disease) (HCC)   DVT (deep venous thrombosis) (HCC)   Chronic anticoagulation   Type 2 diabetes mellitus, uncontrolled (HCC)   Morbid obesity (HCC)   Hypoxia   Acute respiratory failure (HCC)   Gout   Acute respiratory failure with hypercapnia (HCC)  Acute, Complete Respiratory Failure with Hypoxia in a patient with ESRD on HD with volume overload/pulmonary edema per CXR, and a component of obesity hypoventilation syndrome. Bilat pulmonary infiltrates suspected, but unable to be fully visualized. Afebrile. Na 129, Cl 94, Bicarb 24.7, WBC normal at 7, Na 129, Cl 94, Repeat ABGs showed worsening values,  WBC 7. Patient on Bipap at this time CCM consultation was requested, who, after evaluation of patient felt prudent to transfer to their service. He is transferred to ICU and intubated to keep airway safe.  Greatly appreciate CCM  Involvement Once clinically stable will resume his care.    Acute encephalopathy likely due to above and DKA. BS was 488,  corrected at 138 with anion gap 22 . Afebrile.  Plans  as per CCM  ESRD on HD T Th S BL Cr 7. Has had HD x2 on Mon and Tues due to volume overload. Current Cr 8.73. Dry weight 190.9 NA 129 Nephrology involved, Dr. Arlean Hopping notified for emergent HD today . Once patient clinical condition improves will proceed with plans for AVG as per Vascular Surgery Other plans as per Renal  Type II Diabetes with DKA at this time. BS was 488, corrected at 138 with anion gap 22 . He did not take his insulin today in preparation for AVG revision    Recent Labs    Lab Results  Component Value Date   HGBA1C 6.9* 06/17/2015    Hgb A1C Oral diabetic meds on hold PLans as per CCM   Chronic diastolic heart failure, with volume overload per CXR, in the setting of ESRD requiring HD x2, and with impending HD today. last 2 D echo 2011. Dry weight 190 PLans as per CCM   H/o DVT on Chronic Coumadin  Continue anticoagulation while in hospital          Our [facility] hospitalist team will resume the care once patient's clinical status improves.    [facility] E PA-C [facility] 03/04/2016, 12:56 PM

## 2016-03-04 NOTE — Progress Notes (Signed)
   Daily Progress Note  Pt desat into 75% on RA with BG 488 on BMP.  Anesthesia recommends further medical optimization in ED.  - pt not medically stable for procedure - pt needs pulmonary work-up and evaluation for DKA   Leonides SakeBrian Chen, MD [facility] Office: [phone] Pager: [phone]  03/04/2016, 7:54 AM

## 2016-03-04 NOTE — ED Notes (Signed)
Pt presents from Short Stay for low O2 saturations-75% RA.   Pt was having fistula placed this AM by MD Imogene Burnhen.  RN reports pt is sleepy but will answer questions appropriately.  2L applied in short stay, CBG also 488, per pt has not taken insulin in 2 days.  Pt took oxycodone and gabapentin today.   Wife at bedside, reports pt is at his baseline.

## 2016-03-04 NOTE — ED Notes (Signed)
MD Konrad DoloresMerrell HD ready for pt, per MD CCM to see pt prior to dialysis.  Sal in dialysis aware.

## 2016-03-04 NOTE — Progress Notes (Signed)
eLink Physician-Brief Progress Note Patient Name: Danise MinaReginald D Demarco DOB: 04/11/1966 MRN: [id number]   Date of Service  03/04/2016  HPI/Events of Note    eICU Interventions  Pantoprazole ordered for SUP     Intervention Category Intermediate Interventions: Best-practice therapies (e.g. DVT, beta blocker, etc.)  Celesta Funderburk S. 03/04/2016, 9:41 PM

## 2016-03-04 NOTE — ED Notes (Signed)
CCM at bedside 

## 2016-03-04 NOTE — Progress Notes (Signed)
Pt. Was transported to 2H10 without any complications.  

## 2016-03-04 NOTE — ED Notes (Signed)
IV team at bedside 

## 2016-03-04 NOTE — Consult Note (Addendum)
[facility] Renal Consultation Note    Indication for Consultation:  Management of ESRD/hemodialysis; anemia, hypertension/volume and secondary hyperparathyroidism PCP:  HPI: Shane Hamilton is a 50 y.o. male with ESRD (TTS NW), morbid obesity,poorly controlled DM, OSA not using, hx DVT and multiple vascular access problems who presented today for new vascular access procedure by Dr.Chen. He had been off coumadin for a week.  Initial sats were 75% on room air and blood sugar was 488.  He was transferred to the ED for evaluation. CXR showed pulmonary edema.  He will be admitted for emergent HD and diabetes management.  He is quite drowsy, but said he had stayed up all night watching movies.  He had been seen in the ED 5/28 for foot pain for a week with swelling- He was out of pain medicine. CXR done at that time showed pulmonary edema. He was dialyzed 5/29 with a net UF 2.7 L - he returned 5/30 and had another net UF of 2.6 L although the goal in the rounding provider note said 5 L.   Past Medical History  Diagnosis Date  . Type 2 diabetes mellitus, uncontrolled (HCC)        . Morbid obesity (HCC)   . OSA (obstructive sleep apnea)   . Septic shock(785.52)   . Anemia   . Hypertension   . Gout   . LOC (loss of consciousness)   . DVT (deep venous thrombosis) (HCC) 2011  . Cardiomegaly   . Chronic anticoagulation   . Chronic pancreatitis (HCC)   . Secondary hyperparathyroidism (HCC)   . Pneumonia   . GERD (gastroesophageal reflux disease)   . Seizures (HCC)     pt denies  . Headache     migraines  . Shortness of breath dyspnea     with exertion  . ESRD (end stage renal disease) (HCC)     [facility] [place]  . Kidney stones    Past Surgical History  Procedure Laterality Date  . Av fistula placement    . Amputation finger / thumb Right   . I&d extremity Left 06/20/2015    Procedure: IRRIGATION AND DEBRIDEMENT EXTREMITY;  Surgeon: Renford Dills, MD;  Location:  [facility] ORS;  Service: Vascular;  Laterality: Left;  . Application of wound vac Left 06/20/2015    Procedure: APPLICATION OF WOUND VAC;  Surgeon: Renford Dills, MD;  Location: [facility] ORS;  Service: Vascular;  Laterality: Left;  . Av fistula placement Left 06/20/2015    Procedure: ARTERIOVENOUS (AV) FISTULA  ligation , excision;  Surgeon: Renford Dills, MD;  Location: [facility] ORS;  Service: Vascular;  Laterality: Left;  . Peripheral vascular catheterization N/A 06/25/2015    Procedure: Dialysis/Perma Catheter Insertion;  Surgeon: Annice Needy, MD;  Location: [facility] INVASIVE CV LAB;  Service: Cardiovascular;  Laterality: N/A;  . Insertion of dialysis catheter N/A 06/25/2015    Procedure: place a tunnelled catheter for long term use;  Surgeon: Annice Needy, MD;  Location: [facility] ORS;  Service: Vascular;  Laterality: N/A;  . Insertion of dialysis catheter Left 07/25/2015    Procedure: INSERTION OF LEFT INTERNAL JUGULAR DIALYSIS CATHETER;  Surgeon: Pryor Ochoa, MD;  Location: [facility] OR;  Service: Vascular;  Laterality: Left;  . Insertion of dialysis catheter Left 08/01/2015    Procedure: REMOVAL OF DIALYSIS CATHETER; PLACEMNET OF NEW DIALYSIS CATHETER;  Surgeon: Pryor Ochoa, MD;  Location: [facility] OR;  Service: Vascular;  Laterality: Left;  . Peripheral vascular catheterization  Bilateral 09/13/2015    Procedure: Upper Extremity Venography;  Surgeon: Sherren Kerns, MD;  Location: [facility] INVASIVE CV LAB;  Service: Cardiovascular;  Laterality: Bilateral;  . Exchange of a dialysis catheter Left 11/13/2015    Procedure: EXCHANGE OF A DIALYSIS CATHETER- LEFT INTERNAL JUGULAR;  Surgeon: Nada Libman, MD;  Location: [facility] OR;  Service: Vascular;  Laterality: Left;   Family History  Problem Relation Age of Onset  . Diabetes Mother   . Hypertension Mother   . Diabetes Father   . Hypertension Father   . Hypertension Brother    Social History:  reports that he has never smoked. He has never used smokeless tobacco. He  reports that he does not drink alcohol or use illicit drugs. Allergies  Allergen Reactions  . Aspirin Other (See Comments)    Reaction:  GI upset    Prior to Admission medications   Medication Sig Start Date End Date Taking? Authorizing Provider  allopurinol (ZYLOPRIM) 300 MG tablet Take 300 mg by mouth 2 (two) times daily.   Yes Historical Provider, MD  atorvastatin (LIPITOR) 10 MG tablet Take 10 mg by mouth daily.   Yes Historical Provider, MD  calcium acetate (PHOSLO) 667 MG capsule Take 2,001 mg by mouth 3 (three) times daily with meals.    Yes Historical Provider, MD  gabapentin (NEURONTIN) 300 MG capsule Take 600 mg by mouth every 8 (eight) hours.    Yes Historical Provider, MD  insulin aspart protamine- aspart (NOVOLOG MIX 70/30) (70-30) 100 UNIT/ML injection Inject 0.1 mLs (10 Units total) into the skin 3 (three) times daily with meals. Patient taking differently: Inject 35 Units into the skin 3 (three) times daily with meals.  06/26/15  Yes Altamese Dilling, MD  insulin glargine (LANTUS) 100 UNIT/ML injection Inject 0.35 mLs (35 Units total) into the skin at bedtime. Patient taking differently: Inject 70 Units into the skin at bedtime.  06/26/15  Yes Altamese Dilling, MD  multivitamin (RENA-VIT) TABS tablet Take 1 tablet by mouth daily.  12/05/14  Yes Historical Provider, MD  omeprazole (PRILOSEC) 20 MG capsule Take 20 mg by mouth 2 (two) times daily before a meal.    Yes Historical Provider, MD  Oxycodone HCl 10 MG TABS Take 1 tablet (10 mg total) by mouth 2 (two) times daily as needed (pain). 03/01/16  Yes Nelva Nay, MD  warfarin (COUMADIN) 7.5 MG tablet Take 7.5 mg by mouth See admin instructions. Pt takes 7.5mg  on Tuesday, Wednesday, Thursday, Saturday and Sunday and 10mg  on Monday and Friday   Yes Historical Provider, MD  BD PEN NEEDLE NANO U/F 32G X 4 MM MISC  07/12/15   Historical Provider, MD  carvedilol (COREG) 3.125 MG tablet Take 1 tablet (3.125 mg total) by mouth 2  (two) times daily with a meal. 06/26/15   Altamese Dilling, MD  cinacalcet (SENSIPAR) 60 MG tablet Take 60 mg by mouth daily.    Historical Provider, MD  clonazePAM (KLONOPIN) 1 MG tablet Take 1 mg by mouth 2 (two) times daily.    Historical Provider, MD  colchicine 0.6 MG tablet Take 0.6 mg by mouth daily.    Historical Provider, MD  lanthanum (FOSRENOL) 1000 MG chewable tablet Chew 1,000 mg by mouth 3 (three) times daily with meals.     Historical Provider, MD   Current Facility-Administered Medications  Medication Dose Route Frequency Provider Last Rate Last Dose  . doxercalciferol (HECTOROL) injection 7 mcg  7 mcg Intravenous Q M,W,F-HD Weston Settle, PA-C  Current Outpatient Prescriptions  Medication Sig Dispense Refill  . allopurinol (ZYLOPRIM) 300 MG tablet Take 300 mg by mouth 2 (two) times daily.    Marland Kitchen atorvastatin (LIPITOR) 10 MG tablet Take 10 mg by mouth daily.    . calcium acetate (PHOSLO) 667 MG capsule Take 2,001 mg by mouth 3 (three) times daily with meals.     . gabapentin (NEURONTIN) 300 MG capsule Take 600 mg by mouth every 8 (eight) hours.     . insulin aspart protamine- aspart (NOVOLOG MIX 70/30) (70-30) 100 UNIT/ML injection Inject 0.1 mLs (10 Units total) into the skin 3 (three) times daily with meals. (Patient taking differently: Inject 35 Units into the skin 3 (three) times daily with meals. ) 10 mL 11  . insulin glargine (LANTUS) 100 UNIT/ML injection Inject 0.35 mLs (35 Units total) into the skin at bedtime. (Patient taking differently: Inject 70 Units into the skin at bedtime. ) 10 mL 11  . multivitamin (RENA-VIT) TABS tablet Take 1 tablet by mouth daily.   3  . omeprazole (PRILOSEC) 20 MG capsule Take 20 mg by mouth 2 (two) times daily before a meal.     . Oxycodone HCl 10 MG TABS Take 1 tablet (10 mg total) by mouth 2 (two) times daily as needed (pain). 20 tablet 0  . warfarin (COUMADIN) 7.5 MG tablet Take 7.5 mg by mouth See admin instructions. Pt takes  7.5mg  on Tuesday, Wednesday, Thursday, Saturday and Sunday and 10mg  on Monday and Friday    . BD PEN NEEDLE NANO U/F 32G X 4 MM MISC     . carvedilol (COREG) 3.125 MG tablet Take 1 tablet (3.125 mg total) by mouth 2 (two) times daily with a meal. 60 tablet 0  . cinacalcet (SENSIPAR) 60 MG tablet Take 60 mg by mouth daily.    . clonazePAM (KLONOPIN) 1 MG tablet Take 1 mg by mouth 2 (two) times daily.    . colchicine 0.6 MG tablet Take 0.6 mg by mouth daily.    Marland Kitchen lanthanum (FOSRENOL) 1000 MG chewable tablet Chew 1,000 mg by mouth 3 (three) times daily with meals.      Facility-Administered Medications Ordered in Other Encounters  Medication Dose Route Frequency Provider Last Rate Last Dose  . cefUROXime (ZINACEF) 1.5 g in dextrose 5 % 50 mL IVPB  1.5 g Intravenous 30 min Pre-Op Fransisco Hertz, MD       Labs: Basic Metabolic Panel:  Recent Labs Lab 03/01/16 1634 03/04/16 0734 03/04/16 0851  NA 135 132* 129*  K 4.1 4.4 4.5  CL 98*  --  94*  CO2 25  --  22  GLUCOSE 189* 488* 482*  BUN 57*  --  51*  CREATININE 10.20*  --  8.73*  CALCIUM 7.8*  --  8.6*   Liver Function Tests: No results for input(s): AST, ALT, ALKPHOS, BILITOT, PROT, ALBUMIN in the last 168 hours. No results for input(s): LIPASE, AMYLASE in the last 168 hours. No results for input(s): AMMONIA in the last 168 hours. CBC:  Recent Labs Lab 03/01/16 1634 03/04/16 0734 03/04/16 0851  WBC 7.0  --  7.8  NEUTROABS 3.8  --   --   HGB 12.1* 13.9 11.2*  HCT 38.9* 41.0 37.0*  MCV 103.7*  --  104.5*  PLT 228  --  223   Cardiac Enzymes: No results for input(s): CKTOTAL, CKMB, CKMBINDEX, TROPONINI in the last 168 hours. CBG:  Recent Labs Lab 03/04/16 0848  GLUCAP 468*  Iron Studies: No results for input(s): IRON, TIBC, TRANSFERRIN, FERRITIN in the last 72 hours. Studies/Results: Dg Chest 2 View  03/04/2016  CLINICAL DATA:  Hypoxia. EXAM: CHEST  2 VIEW COMPARISON:  03/01/2016. FINDINGS: Dialysis catheter noted with  tip projected over the lower right atrium. Cardiomegaly with the bilateral pulmonary infiltrates most likely secondary to pulmonary edema. No pleural effusion or pneumothorax . IMPRESSION: 1. Dialysis catheter noted with tip projected over the lower right atrium. 2. Cardiomegaly with bilateral pulmonary infiltrates consistent pulmonary edema. Electronically Signed   By: Maisie Fushomas  Register   On: 03/04/2016 10:35    ROS: As per HPI Limited by drowsiness  Physical Exam: Filed Vitals:   03/04/16 1044 03/04/16 1048 03/04/16 1100 03/04/16 1115  BP:   167/83 157/92  Pulse: 104 110 106 108  Temp:      TempSrc:      Resp: 14 29 18 21   SpO2: 97% 98% 98% 95%     General:  Morbidly obese AAM on bipap, keeps dozing off, SOB Head: Normocephalic, atraumatic, facial mask Neck: thick, supple Lungs: diffuse crackles/limited by body habitus Heart:  Tachy regular Abdomen: massively obese Lower extremities: ++ edema LE into thighs - no skin breakdown feet Neuro:  Drowsy   Dialysis Access: left IJ , failed accesses UE   Dialysis Orders: [facility] TTS 5.5 hours 2 K 2.5 Ca 400/800 heparin 15 K bolus with 8 K mid tmtleft IJ venofer 100 x 5 then 50 per week no ESA hectorol L left IJ cath EDW 194 - got to 191.8 5/30 post HD  Assessment/Plan: 1.  Pulmonary edema on Bipap - actually level below prior EDW - goal yesterday 5 L at dialysis - net UF was 2.6 and post wt was 191.4 (EDW 194). I suspect he had excessive intake will staying up all night watching movies. Check CXR post HD 2.  ESRD -  TTS - had HD Monday due to CXR 5/28 pulmonary edema and usual HD Tuesday but pulmonary edema today - emergent HD and probably needs again in am. I'm not clear he can even lie down for surgery due to OSA 3.  Hypertension/volume  - Needs volume down and EDW re -established; pt very controlling of wts/goals in outpt setting; eats lots of ^ Na snacks; RN in outpt unit states he can probably stand for wts with 2 person assist if he doesn't  have to step up. 4.  Anemia  -no ESA for now - continue Fe on routine  days 5.  Metabolic bone disease -  Continue hectorol 6.  Nutrition - changed to renal with fluid restriction 7.    Morbid obesity- has been a huge barrier to access placement 8.    Noncompliance with diet/fluid restriction - chronic issue 8,    OSA - does not have working CPAP at home per wife   Ronny BaconMartha B Bergman, PA-C [facility] Beeper [phone] 03/04/2016, 11:45 AM   Pt seen, examined, agree w assess/plan as above with additions as indicated. ESRD pt with morbid obesity, chron resp failure on home O2, presented for access procedure and was very hypoxic and BS was high so sent to ED instead.  Here pt's pCO2 rose up to 80 and he became somnolent requiring intubation.  CXR initially w pulm edema. On exam has some pretib edema, not much edema elsewhere.  Asked to see for HD.  Plan HD today.  Vinson Moselleob Yasmine Kilbourne MD [facility] pager [phone]    cell [phone] 03/04/2016,  1:54 PM

## 2016-03-04 NOTE — ED Provider Notes (Signed)
CSN: [id number]     Arrival date & time 03/04/16  0800 History   First MD Initiated Contact with Patient 03/04/16 [phone]     Chief Complaint  Patient presents with  . Hyperglycemia     (Consider location/radiation/quality/duration/timing/severity/associated sxs/prior Treatment) HPI  50 year old male with a history of morbid obesity, type 2 diabetes, DVT/PE on chronic anticoagulation and ESRD on dialysis presents from short stay for hypoxia and hyperglycemia. Oxygen saturation on room air were 75%. CBG was 488. Patient tells me that he feels normal and that his glucose is high because he was told not to take any medicines including his insulin this morning before having his graft revised. Patient denies any shortness of breath. He is not chronically on oxygen. Patient has had dialysis last 2 days because he had too much fluid. Patient states he feels like he's back to his dry weight. Patient states he's had a cough but he thinks is from allergies. No fevers. No pain or leg swelling worse than typical. Patient is currently sleepy but states that he stayed up all night watching movies.  Past Medical History  Diagnosis Date  . Type 2 diabetes mellitus, uncontrolled (HCC)        . Morbid obesity (HCC)   . OSA (obstructive sleep apnea)   . Septic shock(785.52)   . Anemia   . Hypertension   . Gout   . LOC (loss of consciousness)   . DVT (deep venous thrombosis) (HCC) 2011  . Cardiomegaly   . Chronic anticoagulation   . Chronic pancreatitis (HCC)   . Secondary hyperparathyroidism (HCC)   . Pneumonia   . GERD (gastroesophageal reflux disease)   . Seizures (HCC)     pt denies  . Headache     migraines  . Shortness of breath dyspnea     with exertion  . ESRD (end stage renal disease) (HCC)     [facility] [place]  . Kidney stones    Past Surgical History  Procedure Laterality Date  . Av fistula placement    . Amputation finger / thumb Right   . I&d extremity Left 06/20/2015     Procedure: IRRIGATION AND DEBRIDEMENT EXTREMITY;  Surgeon: Renford Dills, MD;  Location: [facility] ORS;  Service: Vascular;  Laterality: Left;  . Application of wound vac Left 06/20/2015    Procedure: APPLICATION OF WOUND VAC;  Surgeon: Renford Dills, MD;  Location: [facility] ORS;  Service: Vascular;  Laterality: Left;  . Av fistula placement Left 06/20/2015    Procedure: ARTERIOVENOUS (AV) FISTULA  ligation , excision;  Surgeon: Renford Dills, MD;  Location: [facility] ORS;  Service: Vascular;  Laterality: Left;  . Peripheral vascular catheterization N/A 06/25/2015    Procedure: Dialysis/Perma Catheter Insertion;  Surgeon: Annice Needy, MD;  Location: [facility] INVASIVE CV LAB;  Service: Cardiovascular;  Laterality: N/A;  . Insertion of dialysis catheter N/A 06/25/2015    Procedure: place a tunnelled catheter for long term use;  Surgeon: Annice Needy, MD;  Location: [facility] ORS;  Service: Vascular;  Laterality: N/A;  . Insertion of dialysis catheter Left 07/25/2015    Procedure: INSERTION OF LEFT INTERNAL JUGULAR DIALYSIS CATHETER;  Surgeon: Pryor Ochoa, MD;  Location: [facility] OR;  Service: Vascular;  Laterality: Left;  . Insertion of dialysis catheter Left 08/01/2015    Procedure: REMOVAL OF DIALYSIS CATHETER; PLACEMNET OF NEW DIALYSIS CATHETER;  Surgeon: Pryor Ochoa, MD;  Location: [facility] OR;  Service: Vascular;  Laterality: Left;  .  Peripheral vascular catheterization Bilateral 09/13/2015    Procedure: Upper Extremity Venography;  Surgeon: Sherren Kernsharles E Fields, MD;  [facility];  Service: Cardiovascular;  Laterality: Bilateral;  . Exchange of a dialysis catheter Left 11/13/2015    Procedure: EXCHANGE OF A DIALYSIS CATHETER- LEFT INTERNAL JUGULAR;  Surgeon: Nada LibmanVance W Brabham, MD;  Location: [facility] OR;  Service: Vascular;  Laterality: Left;   Family History  Problem Relation Age of Onset  . Diabetes Mother   . Hypertension Mother   . Diabetes Father   . Hypertension Father   . Hypertension Brother     Social History  Substance Use Topics  . Smoking status: Never Smoker   . Smokeless tobacco: Never Used  . Alcohol Use: No    Review of Systems  Constitutional: Negative for fever.  Respiratory: Positive for cough. Negative for shortness of breath.   Cardiovascular: Negative for chest pain and leg swelling.  All other systems reviewed and are negative.     Allergies  Aspirin  Home Medications   Prior to Admission medications   Medication Sig Start Date End Date Taking? Authorizing Provider  allopurinol (ZYLOPRIM) 300 MG tablet Take 300 mg by mouth 2 (two) times daily.   Yes Historical Provider, MD  atorvastatin (LIPITOR) 10 MG tablet Take 10 mg by mouth daily.   Yes Historical Provider, MD  calcium acetate (PHOSLO) 667 MG capsule Take 2,001 mg by mouth 3 (three) times daily with meals.    Yes Historical Provider, MD  gabapentin (NEURONTIN) 300 MG capsule Take 600 mg by mouth every 8 (eight) hours.    Yes Historical Provider, MD  insulin aspart protamine- aspart (NOVOLOG MIX 70/30) (70-30) 100 UNIT/ML injection Inject 0.1 mLs (10 Units total) into the skin 3 (three) times daily with meals. Patient taking differently: Inject 35 Units into the skin 3 (three) times daily with meals.  06/26/15  Yes Altamese DillingVaibhavkumar Vachhani, MD  insulin glargine (LANTUS) 100 UNIT/ML injection Inject 0.35 mLs (35 Units total) into the skin at bedtime. Patient taking differently: Inject 70 Units into the skin at bedtime.  06/26/15  Yes Altamese DillingVaibhavkumar Vachhani, MD  multivitamin (RENA-VIT) TABS tablet Take 1 tablet by mouth daily.  12/05/14  Yes Historical Provider, MD  omeprazole (PRILOSEC) 20 MG capsule Take 20 mg by mouth 2 (two) times daily before a meal.    Yes Historical Provider, MD  Oxycodone HCl 10 MG TABS Take 1 tablet (10 mg total) by mouth 2 (two) times daily as needed (pain). 03/01/16  Yes Nelva Nayobert Beaton, MD  warfarin (COUMADIN) 7.5 MG tablet Take 7.5 mg by mouth See admin instructions. Pt takes 7.5mg  on  Tuesday, Wednesday, Thursday, Saturday and Sunday and 10mg  on Monday and Friday   Yes Historical Provider, MD  BD PEN NEEDLE NANO U/F 32G X 4 MM MISC  07/12/15   Historical Provider, MD  carvedilol (COREG) 3.125 MG tablet Take 1 tablet (3.125 mg total) by mouth 2 (two) times daily with a meal. 06/26/15   Altamese DillingVaibhavkumar Vachhani, MD  cinacalcet (SENSIPAR) 60 MG tablet Take 60 mg by mouth daily.    Historical Provider, MD  clonazePAM (KLONOPIN) 1 MG tablet Take 1 mg by mouth 2 (two) times daily.    Historical Provider, MD  colchicine 0.6 MG tablet Take 0.6 mg by mouth daily.    Historical Provider, MD  lanthanum (FOSRENOL) 1000 MG chewable tablet Chew 1,000 mg by mouth 3 (three) times daily with meals.     Historical Provider, MD  BP 144/90 mmHg  Pulse 103  Temp(Src) 98.1 F (36.7 C) (Oral)  Resp 14  SpO2 94% Physical Exam  Constitutional: He is oriented to person, place, and time. He appears well-developed and well-nourished. No distress.  Patient is sleepy but easily arouses to voice Morbidly obese  HENT:  Head: Normocephalic and atraumatic.  Right Ear: External ear normal.  Left Ear: External ear normal.  Nose: Nose normal.  Eyes: Right eye exhibits no discharge. Left eye exhibits no discharge.  Neck: Neck supple.  Cardiovascular: Normal rate, regular rhythm, normal heart sounds and intact distal pulses.   Pulmonary/Chest: Effort normal.  Decreased breath sounds. Difficult to tell if from obesity or poor effort or pathology  Musculoskeletal: He exhibits no edema.  Neurological: He is alert and oriented to person, place, and time.  Skin: Skin is warm and dry. He is not diaphoretic.  Nursing note and vitals reviewed.   ED Course  Procedures (including critical care time) Labs Review Labs Reviewed  PROTIME-INR - Abnormal; Notable for the following:    Prothrombin Time 16.7 (*)    All other components within normal limits  BASIC METABOLIC PANEL - Abnormal; Notable for the  following:    Sodium 129 (*)    Chloride 94 (*)    Glucose, Bld 482 (*)    BUN 51 (*)    Creatinine, Ser 8.73 (*)    Calcium 8.6 (*)    GFR calc non Af Amer 6 (*)    GFR calc Af Amer 7 (*)    All other components within normal limits  CBC - Abnormal; Notable for the following:    RBC 3.54 (*)    Hemoglobin 11.2 (*)    HCT 37.0 (*)    MCV 104.5 (*)    RDW 16.8 (*)    All other components within normal limits  POCT I-STAT 4, (NA,K, GLUC, HGB,HCT) - Abnormal; Notable for the following:    Sodium 132 (*)    Glucose, Bld 488 (*)    All other components within normal limits  CBG MONITORING, ED - Abnormal; Notable for the following:    Glucose-Capillary 468 (*)    All other components within normal limits  I-STAT ARTERIAL BLOOD GAS, ED - Abnormal; Notable for the following:    pO2, Arterial 140.0 (*)    Acid-base deficit 3.0 (*)    All other components within normal limits  I-STAT VENOUS BLOOD GAS, ED - Abnormal; Notable for the following:    pCO2, Ven 56.0 (*)    pO2, Ven 51.0 (*)    Bicarbonate 24.7 (*)    Acid-base deficit 3.0 (*)    All other components within normal limits  I-STAT VENOUS BLOOD GAS, ED - Abnormal; Notable for the following:    pH, Ven 7.201 (*)    pCO2, Ven 68.1 (*)    pO2, Ven 52.0 (*)    Bicarbonate 26.7 (*)    Acid-base deficit 3.0 (*)    All other components within normal limits  I-STAT ARTERIAL BLOOD GAS, ED - Abnormal; Notable for the following:    pH, Arterial 7.111 (*)    pCO2 arterial 80.4 (*)    pO2, Arterial 70.0 (*)    Bicarbonate 25.7 (*)    Acid-base deficit 6.0 (*)    All other components within normal limits  BETA-HYDROXYBUTYRIC ACID  URINALYSIS, ROUTINE W REFLEX MICROSCOPIC (NOT AT [facility])  COMPREHENSIVE METABOLIC PANEL  MAGNESIUM  PHOSPHORUS  LACTIC ACID, PLASMA  CBC WITH DIFFERENTIAL/PLATELET  PROTIME-INR  APTT  BLOOD GAS, ARTERIAL  HEMOGLOBIN A1C  I-STAT CG4 LACTIC ACID, ED    Imaging Review Dg Chest 2 View  03/04/2016   CLINICAL DATA:  Hypoxia. EXAM: CHEST  2 VIEW COMPARISON:  03/01/2016. FINDINGS: Dialysis catheter noted with tip projected over the lower right atrium. Cardiomegaly with the bilateral pulmonary infiltrates most likely secondary to pulmonary edema. No pleural effusion or pneumothorax . IMPRESSION: 1. Dialysis catheter noted with tip projected over the lower right atrium. 2. Cardiomegaly with bilateral pulmonary infiltrates consistent pulmonary edema. Electronically Signed   By: Maisie Fus  Register   On: 03/04/2016 10:35   Dg Chest Port 1 View  03/04/2016  CLINICAL DATA:  Endotracheal tube placement. EXAM: PORTABLE CHEST 1 VIEW COMPARISON:  Same day. FINDINGS: Stable cardiomegaly. Nasogastric tube tip is seen in proximal stomach. Left internal jugular catheter is unchanged in position with tip projected over right atrium. Endotracheal tube is projected over tracheal air shadow with distal tip 4.5 cm above the carina. Hypoinflation of the lungs is noted. No pneumothorax or pleural effusion is noted. No acute pulmonary disease is noted. IMPRESSION: Endotracheal and nasogastric tubes are in grossly good position. Left internal jugular catheter is unchanged in position with tip projected over right atrium. Hypoinflation the lungs is noted without definite acute pulmonary disease. Electronically Signed   By: Lupita Raider, M.D.   On: 03/04/2016 13:36   Dg Abd Portable 1v  03/04/2016  CLINICAL DATA:  Nasogastric tube placement EXAM: PORTABLE ABDOMEN - 1 VIEW COMPARISON:  None. FINDINGS: Nasogastric tube tip and side port are in the stomach. Bowel gas pattern is unremarkable. There is consolidation in the medial left lung base. No free air evident. IMPRESSION: Nasogastric tube tip and side port in stomach. Bowel gas pattern unremarkable. Consolidation left base. Electronically Signed   By: Bretta Bang III M.D.   On: 03/04/2016 13:34   I have personally reviewed and evaluated these images and lab results as part  of my medical decision-making.   EKG Interpretation None      CRITICAL CARE Performed by: Pricilla Loveless T   Total critical care time: 30 minutes  Critical care time was exclusive of separately billable procedures and treating other patients.  Critical care was necessary to treat or prevent imminent or life-threatening deterioration.  Critical care was time spent personally by me on the following activities: development of treatment plan with patient and/or surrogate as well as nursing, discussions with consultants, evaluation of patient's response to treatment, examination of patient, obtaining history from patient or surrogate, ordering and performing treatments and interventions, ordering and review of laboratory studies, ordering and review of radiographic studies, pulse oximetry and re-evaluation of patient's condition.  MDM   Final diagnoses:  Encounter for intubation  Acute respiratory failure with hypoxia and hypercapnia (HCC)    Patient asked to leave multiple times. However he then quickly falls asleep. Initially he is easily arousable but then falls asleep. Hypoxia is likely multifactorial. Likely some component of pulmonary edema. However his likely sleep apnea and morbid obesity are concerning as well. Initial ABG does not correlate with symptoms and RT says it was a bad sample. VBG shows some respiratory acidosis. After convincing patient to stay for treatment and admission, he was placed on BiPAP. However he was worse whenever medicine came to admit. Thus critical care consulted and decided to admit given worsening blood gas. Admit to ICU.    Pricilla Loveless, MD 03/04/16 1535

## 2016-03-05 DIAGNOSIS — J810 Acute pulmonary edema: Secondary | ICD-10-CM

## 2016-03-05 LAB — GLUCOSE, CAPILLARY
GLUCOSE-CAPILLARY: 250 mg/dL — AB (ref 65–99)
GLUCOSE-CAPILLARY: 233 mg/dL — AB (ref 65–99)
GLUCOSE-CAPILLARY: 168 mg/dL — AB (ref 65–99)
GLUCOSE-CAPILLARY: 142 mg/dL — AB (ref 65–99)
GLUCOSE-CAPILLARY: 186 mg/dL — AB (ref 65–99)
GLUCOSE-CAPILLARY: 152 mg/dL — AB (ref 65–99)
GLUCOSE-CAPILLARY: 156 mg/dL — AB (ref 65–99)
GLUCOSE-CAPILLARY: 157 mg/dL — AB (ref 65–99)
Glucose-Capillary: 138 mg/dL — ABNORMAL HIGH (ref 65–99)
Glucose-Capillary: 152 mg/dL — ABNORMAL HIGH (ref 65–99)
Glucose-Capillary: 156 mg/dL — ABNORMAL HIGH (ref 65–99)
Glucose-Capillary: 158 mg/dL — ABNORMAL HIGH (ref 65–99)
Glucose-Capillary: 188 mg/dL — ABNORMAL HIGH (ref 65–99)
Glucose-Capillary: 194 mg/dL — ABNORMAL HIGH (ref 65–99)
Glucose-Capillary: 156 mg/dL — ABNORMAL HIGH (ref 65–99)
Glucose-Capillary: 152 mg/dL — ABNORMAL HIGH (ref 65–99)

## 2016-03-05 LAB — BASIC METABOLIC PANEL
Anion gap: 12 (ref 5–15)
BUN: 24 mg/dL — AB (ref 6–20)
CHLORIDE: 98 mmol/L — AB (ref 101–111)
CO2: 24 mmol/L (ref 22–32)
CREATININE: 5.79 mg/dL — AB (ref 0.61–1.24)
Calcium: 8.5 mg/dL — ABNORMAL LOW (ref 8.9–10.3)
GFR calc Af Amer: 12 mL/min — ABNORMAL LOW (ref >60)
GFR calc non Af Amer: 10 mL/min — ABNORMAL LOW (ref >60)
GLUCOSE: 149 mg/dL — AB (ref 65–99)
Potassium: 3.6 mmol/L (ref 3.5–5.1)
Sodium: 134 mmol/L — ABNORMAL LOW (ref 135–145)

## 2016-03-05 LAB — POCT I-STAT 3, ART BLOOD GAS (G3+)
ACID-BASE EXCESS: 2 mmol/L (ref 0.0–2.0)
BICARBONATE: 27.2 meq/L — AB (ref 20.0–24.0)
O2 Saturation: 94 %
PH ART: 7.378 (ref 7.350–7.450)
PO2 ART: 74 mmHg — AB (ref 80.0–100.0)
Patient temperature: 98.6
TCO2: 29 mmol/L (ref 0–100)
pCO2 arterial: 46.2 mmHg — ABNORMAL HIGH (ref 35.0–45.0)

## 2016-03-05 LAB — CBC
HEMATOCRIT: 34.1 % — AB (ref 39.0–52.0)
HEMOGLOBIN: 10 g/dL — AB (ref 13.0–17.0)
MCH: 30.5 pg (ref 26.0–34.0)
MCHC: 29.3 g/dL — AB (ref 30.0–36.0)
MCV: 104 fL — AB (ref 78.0–100.0)
Platelets: 175 10*3/uL (ref 150–400)
RBC: 3.28 MIL/uL — ABNORMAL LOW (ref 4.22–5.81)
RDW: 16.7 % — ABNORMAL HIGH (ref 11.5–15.5)
WBC: 8.1 10*3/uL (ref 4.0–10.5)

## 2016-03-05 LAB — MAGNESIUM: Magnesium: 1.7 mg/dL (ref 1.7–2.4)

## 2016-03-05 LAB — PHOSPHORUS: PHOSPHORUS: 3.5 mg/dL (ref 2.5–4.6)

## 2016-03-05 LAB — HEMOGLOBIN A1C
Hgb A1c MFr Bld: 12.7 % — ABNORMAL HIGH (ref 4.8–5.6)
MEAN PLASMA GLUCOSE: 318 mg/dL

## 2016-03-05 LAB — PROTIME-INR
INR: 1.33 (ref 0.00–1.49)
Prothrombin Time: 16.6 seconds — ABNORMAL HIGH (ref 11.6–15.2)

## 2016-03-05 MED ORDER — WARFARIN SODIUM 10 MG PO TABS
10.0000 mg | ORAL_TABLET | Freq: Once | ORAL | Status: AC
  Administered 2016-03-05: 10 mg via ORAL
  Filled 2016-03-05: qty 1

## 2016-03-05 MED ORDER — SODIUM CHLORIDE 0.9 % IV BOLUS (SEPSIS)
250.0000 mL | Freq: Once | INTRAVENOUS | Status: AC
  Administered 2016-03-05: 250 mL via INTRAVENOUS

## 2016-03-05 NOTE — Discharge Summary (Signed)
Physician Discharge Summary       Patient ID: DIXON LUCZAK MRN: [id number] DOB/AGE: Dec 11, 1965 50 y.o.  Admit date: 03/04/2016 Discharge date: 03/05/2016  Discharge Diagnoses:  Principal Problem:   Acute respiratory failure with hypoxia (HCC) Active Problems:   Obstructive sleep apnea   ESRD (end stage renal disease) (HCC)   DVT (deep venous thrombosis) (HCC)   Chronic anticoagulation   Type 2 diabetes mellitus, uncontrolled (HCC)   Morbid obesity (HCC)   Hypoxia   Acute respiratory failure (HCC)   Gout   Acute respiratory failure with hypercapnia (HCC)   Diabetes mellitus with complication (HCC)   Acute encephalopathy   Acute pulmonary edema (HCC)   History of Present Illness/Hospital Course:  ALVEY BROCKEL is a 50 y.o. male with medical history significant for OSA with morbid obesity, DM2, CHF, h/o DVT on chronic coumadin, ESRD on HD, brought today from short stay due to O2 desat to 75 % in RA, and increased lethargy as he was to undergo AVG revision. His wife reports that he was more alert this morning prior to arrival to short stay, but did have increasing shortness of breath since last Sunday, undergoing HD x2 on Monday and Tuesday. History is obtained by wife, who is a good historian: No apparent rhinorrhea or hemoptysis. No frothy sputum. Nenies fevers, chills, night sweats. No chest pain, chest wall pain or palpitations. No abdominal pain. No apparent nausea orvomiting, dizziness or vertigo. Reports lowerextremity swelling. He is confused . Denies any vision changes, double vision or headaches.Denies any sick contacts or recent long distance travels. Patinet had not taken his meds this morning, including his insulin in preparation to the procedure. He was transferred to the ED for further evaluation. Initially his pH and CO2 were ok, but he was hypoxemic. He was then placed on BiPap and 100% FiO2, CO2 rose with no worsening in mental status. He was intubated by PCCM  in the ED. The following morning he was tolerating SBT well and was able to be extubated. He tolerated this very well and was very anxious to go home. He was preparing to leave AMA, however, he agreed to wait until the afternoon so he could be evaluated post-extubation, and give hospital staff time to prepare for his outpatient needs.    Discharge Plan by active problems   Acute hypercarbic and hypoxic respiratory failure in setting of what is likely decompensated diastolic HF and cor pulmonale due to OSA/OHS with CPAP noncompliance - Home CPAP 20cmH20 - Pulmonary follow up 6/7 - Educated patient and wife on importance of CPAP compliance  Acute on chronic diastolic dysfunction (improved) H/o HTN with Hypertensive Urgency (resolved) -Repeat ECHO for evaluation of diastolic function -Resume home coreg, lipitor  ESRD Gout  - HD reg scheduled sat - Resume home Allopurinol, Phoslo, Sensipar - Resume allopurinol - Resume colchicine   DVT Anemia - Warfarin for goal INR 2-3, resume home regimen.  Hyperglycemia/DKA BS on admission 488 - Gap has closed - Resume home Lantus, 70/30    Significant Hospital tests/ studies  Consults   Discharge Exam: BP 148/84 mmHg  Pulse 103  Temp(Src) 99 F (37.2 C) (Oral)  Resp 20  Ht 6' (1.829 m)  Wt 188.5 kg (415 lb 9.1 oz)  BMI 56.35 kg/m2  SpO2 95%  General: Morbidly obese AAM. Intubated, sedated.  Neuro: CN grossly intact. Sedated. HEENT: Moist mucous membranes. PERRL Cardiovascular: Normal rate and rhythm, unable to determine JVD 2/2 body habitus, No edema noted  Lungs: Diminished bilaterally. Crackles at bases.  Abdomen: Soft and non-tender, morbidly obese Musculoskeletal: (-) gross abnormality Skin: No issues  Labs at discharge Lab Results  Component Value Date   CREATININE 5.79* 03/05/2016   BUN 24* 03/05/2016   NA 134* 03/05/2016   K 3.6 03/05/2016   CL 98* 03/05/2016   CO2 24 03/05/2016   Lab Results  Component  Value Date   WBC 8.1 03/05/2016   HGB 10.0* 03/05/2016   HCT 34.1* 03/05/2016   MCV 104.0* 03/05/2016   PLT 175 03/05/2016   Lab Results  Component Value Date   ALT 46 03/04/2016   AST 42* 03/04/2016   ALKPHOS 157* 03/04/2016   BILITOT 1.0 03/04/2016   Lab Results  Component Value Date   INR 1.33 03/05/2016   INR 1.36 03/04/2016   INR 1.34 03/04/2016    Current radiology studies Dg Chest 2 View  03/04/2016  CLINICAL DATA:  Hypoxia. EXAM: CHEST  2 VIEW COMPARISON:  03/01/2016. FINDINGS: Dialysis catheter noted with tip projected over the lower right atrium. Cardiomegaly with the bilateral pulmonary infiltrates most likely secondary to pulmonary edema. No pleural effusion or pneumothorax . IMPRESSION: 1. Dialysis catheter noted with tip projected over the lower right atrium. 2. Cardiomegaly with bilateral pulmonary infiltrates consistent pulmonary edema. Electronically Signed   By: Maisie Fus  Register   On: 03/04/2016 10:35   Dg Chest Port 1 View  03/04/2016  CLINICAL DATA:  Endotracheal tube placement. EXAM: PORTABLE CHEST 1 VIEW COMPARISON:  Same day. FINDINGS: Stable cardiomegaly. Nasogastric tube tip is seen in proximal stomach. Left internal jugular catheter is unchanged in position with tip projected over right atrium. Endotracheal tube is projected over tracheal air shadow with distal tip 4.5 cm above the carina. Hypoinflation of the lungs is noted. No pneumothorax or pleural effusion is noted. No acute pulmonary disease is noted. IMPRESSION: Endotracheal and nasogastric tubes are in grossly good position. Left internal jugular catheter is unchanged in position with tip projected over right atrium. Hypoinflation the lungs is noted without definite acute pulmonary disease. Electronically Signed   By: Lupita Raider, M.D.   On: 03/04/2016 13:36   Dg Abd Portable 1v  03/04/2016  CLINICAL DATA:  Nasogastric tube placement EXAM: PORTABLE ABDOMEN - 1 VIEW COMPARISON:  None. FINDINGS:  Nasogastric tube tip and side port are in the stomach. Bowel gas pattern is unremarkable. There is consolidation in the medial left lung base. No free air evident. IMPRESSION: Nasogastric tube tip and side port in stomach. Bowel gas pattern unremarkable. Consolidation left base. Electronically Signed   By: Bretta Bang III M.D.   On: 03/04/2016 13:34    Disposition:  01-Home or Self Care      Discharge Instructions    Call MD for:  difficulty breathing, headache or visual disturbances    Complete by:  As directed      Diet - low sodium heart healthy    Complete by:  As directed      Increase activity slowly    Complete by:  As directed             Medication List    STOP taking these medications        insulin aspart protamine- aspart (70-30) 100 UNIT/ML injection  Commonly known as:  NOVOLOG MIX 70/30      TAKE these medications        allopurinol 300 MG tablet  Commonly known as:  ZYLOPRIM  Take 300 mg by  mouth 2 (two) times daily.     atorvastatin 10 MG tablet  Commonly known as:  LIPITOR  Take 10 mg by mouth daily.     BD PEN NEEDLE NANO U/F 32G X 4 MM Misc  Generic drug:  Insulin Pen Needle     carvedilol 3.125 MG tablet  Commonly known as:  COREG  Take 1 tablet (3.125 mg total) by mouth 2 (two) times daily with a meal.     cinacalcet 60 MG tablet  Commonly known as:  SENSIPAR  Take 60 mg by mouth daily.     clonazePAM 1 MG tablet  Commonly known as:  KLONOPIN  Take 1 mg by mouth 2 (two) times daily.     colchicine 0.6 MG tablet  Take 0.6 mg by mouth 3 (three) times daily.     gabapentin 300 MG capsule  Commonly known as:  NEURONTIN  Take 600 mg by mouth every 8 (eight) hours.     insulin glargine 100 UNIT/ML injection  Commonly known as:  LANTUS  Inject 0.35 mLs (35 Units total) into the skin at bedtime.     lanthanum 1000 MG chewable tablet  Commonly known as:  FOSRENOL  Chew 1,000 mg by mouth 3 (three) times daily with meals.      multivitamin Tabs tablet  Take 1 tablet by mouth daily.     omeprazole 20 MG capsule  Commonly known as:  PRILOSEC  Take 20 mg by mouth 2 (two) times daily before a meal.     Oxycodone HCl 10 MG Tabs  Take 1 tablet (10 mg total) by mouth 2 (two) times daily as needed (pain).     PHOSLO 667 MG capsule  Generic drug:  calcium acetate  Take 2,001 mg by mouth 3 (three) times daily with meals.     warfarin 7.5 MG tablet  Commonly known as:  COUMADIN  Take 7.5 mg by mouth See admin instructions. Pt takes 7.5mg  on Tuesday, Wednesday, Thursday, Saturday and Sunday and 10mg  on Monday and Friday       Follow-up Information    Follow up with Bevelyn NgoSarah F Groce, NP On 03/11/2016.   Specialty:  Pulmonary Disease   Why:  11am [facility]   Contact information:   [address] [place] [address] [phone]       Follow up with Darrow BussingKOIRALA,DIBAS, MD On 03/09/2016.   Specialty:  Family Medicine   Why:  12:15 PM   Contact information:   [address] [place] [address] [phone]       Discharged Condition: good  Greater than 35 minutes of time have been dedicated to discharge assessment, planning and discharge instructions.    Signed: Joneen RoachPaul Richanda Darin, AGACNP-BC [facility] Pager [phone] or [phone]  03/05/2016 4:03 PM

## 2016-03-05 NOTE — Progress Notes (Signed)
Discharge instructions given to patient. Verbalizes understanding. Will take out via WC.

## 2016-03-05 NOTE — Progress Notes (Signed)
md ordered home cpap. Pt states had last sleep study at [facility]. He never received cpap. Alerted jermaine w [facility] of need for cpap. He is ck to see if can find last sleep study.

## 2016-03-05 NOTE — Progress Notes (Signed)
[facility]  Pt extubated and doing well.  Wife at bedside. No issues.  (-) subjective complaints.  Pt is at baseline.   VSS. 92% on RA.  (-) NVD. Short stout neck. Bibasilar crackles. Good s1/s2.  Morbidly obese.   He wants to go home. Pt with severe osa, non compliant. Was on cpap 20 cm h2O. Will try to order cpap now. If not, will need a rpt sleep study soonest so we can order cpap/bipap. He will likely need bipap.  Will observe pt until this pm and will d/c him.  Renal diet. Resume home meds. Will d/c insulin drip.  No signs of infxn.  Wife updated of plans and is in agreement.   Needs f/u with [facility] in 1 week.   Pollie MeyerJ. Angelo A de Dios, MD 03/05/2016, 12:42 PM [facility] Pager [phone] After 3 pm or if no answer, call [phone]

## 2016-03-05 NOTE — Progress Notes (Signed)
[facility] Progress Note   Subjective: alert, extubated  Filed Vitals:   03/05/16 0800 03/05/16 0833 03/05/16 0947 03/05/16 1107  BP: 91/56 108/81    Pulse: 86 96 110 101  Temp:      TempSrc:      Resp: 20 20 21 19   Height:      Weight:      SpO2: 100% 99% 100% 99%    Inpatient medications: . allopurinol  300 mg Oral BID  . antiseptic oral rinse  7 mL Mouth Rinse 10 times per day  . atorvastatin  10 mg Oral q1800  . calcium acetate  2,001 mg Oral TID WC  . carvedilol  3.125 mg Oral BID WC  . chlorhexidine gluconate (SAGE KIT)  15 mL Mouth Rinse BID  . cinacalcet  60 mg Oral Q breakfast  . doxercalciferol  7 mcg Intravenous Q T,Th,Sa-HD  . fentaNYL (SUBLIMAZE) injection  50 mcg Intravenous Once  . pantoprazole (PROTONIX) IV  40 mg Intravenous Q24H  . warfarin  10 mg Oral ONCE-1800  . Warfarin - Pharmacist Dosing Inpatient   Does not apply q1800   . fentaNYL infusion INTRAVENOUS 25 mcg/hr (03/05/16 0600)  . insulin (NOVOLIN-R) infusion 3.8 mL/hr at 03/05/16 0730   sodium chloride, docusate, etomidate, fentaNYL, fentaNYL (SUBLIMAZE) injection, fentaNYL, midazolam, midazolam, rocuronium  Exam: No distress, nasal O2 NO jvd Chest clear bilat no rales or wheezing Cor RRR no RG ABd morbid obese ntnd 1+ pretib edema bilat LE's Neuro is alert, nf Ox 3  D[facility]: [facility]  5.5h 2/2.5 bath  Hep 15,000 w 8K mid Rx  L IJ cath 194kg dry wt, got to 191.8 last HD      Assessment: 1.  Acute resp failure - this was not pulm edema, this was CO2 retention. He is not vol overloaded and is below his dry wt.  Extubated, sats 100% on nasal cannula.  Only 1.5 kg off w HD last night.  2.  HD access - have spoke w [facility] , they don't have a spot to do his surgery this week, suggested he reschedle the new access surgery.   3.  Chron resp failure / OHS - stable 4.  Volume - no vol excess, 3-4 kg under dry wt, pt is compliant w HD 5.  OSA - needs mask for home CPAP  Plan - ok for  d/c, patient will have usual next HD on Sat at OP center.  He will reschedule surgery, will have to be done under gen anesthesia most likely.     Kelly Splinter MD [facility] pager [phone]    cell [phone] 03/05/2016, 11:12 AM    Recent Labs Lab 03/04/16 0851 03/04/16 1541 03/05/16 0521  NA 129* 130* 134*  K 4.5 5.1 3.6  CL 94* 94* 98*  CO2 22 24 24   GLUCOSE 482* 451* 149*  BUN 51* 55* 24*  CREATININE 8.73* 9.42* 5.79*  CALCIUM 8.6* 8.2* 8.5*  PHOS  --  7.1* 3.5    Recent Labs Lab 03/04/16 1541  AST 42*  ALT 46  ALKPHOS 157*  BILITOT 1.0  PROT 7.7  ALBUMIN 3.3*    Recent Labs Lab 03/01/16 1634  03/04/16 0851 03/04/16 1541 03/05/16 0521  WBC 7.0  --  7.8 6.8 8.1  NEUTROABS 3.8  --   --  4.0  --   HGB 12.1*  < > 11.2* 10.7* 10.0*  HCT 38.9*  < > 37.0* 36.0* 34.1*  MCV 103.7*  --  104.5* 105.0* 104.0*  PLT 228  --  223 223 175  < > = values in this interval not displayed.

## 2016-03-05 NOTE — Consult Note (Signed)
PULMONARY / CRITICAL CARE MEDICINE   Name: Shane MinaReginald D Placido MRN: [id number] DOB: 10/11/1965    ADMISSION DATE:  03/04/2016   CONSULTATION DATE:  03/04/16  REFERRING MD:  Pricilla LovelessScott Goldston MD  CHIEF COMPLAINT:  Hypoxia and Hyperglycemia  HISTORY OF PRESENT ILLNESS:   Shane Hamilton is a 50 y.o. male with medical history significant for OSA with morbid obesity, DM2, CHF, h/o DVT on chronic coumadin, ESRD on HD, brought today from short stay due to O2 desat to 75 % in RA, and increased lethargy as he was to undergo AVG revision. His wife reports that he was more alert this morning prior to arrival to short stay, but did have increasing shortness of breath since last Sunday, undergoing HD x2 on Monday and Tuesday. History is obtained by wife, who is a good historian: No apparent rhinorrhea or hemoptysis. No frothy sputum. Nenies fevers, chills, night sweats. No chest pain, chest wall pain or palpitations. No abdominal pain. No apparent nausea orvomiting, dizziness or vertigo. Reports lowerextremity swelling. He is confused . Denies any vision changes, double vision or headaches.Denies any sick contacts or recent long distance travels. Patinet had not taken his meds this morning, including his insulin in preparation to the procedure. He was transferred to the ED for further evaluation. He was then placed on BiPap, CO2 continued to rise with no improvement in mental status. He was intubated by PCCM in the ED.    SUBJECTIVE:  Pt intubated for resp failure. No resp issues overnight. Being rx for DKA. Tolerated HD.  Had soft BP improved with bolus.    VITAL SIGNS: BP 108/81 mmHg  Pulse 96  Temp(Src) 99.2 F (37.3 C) (Axillary)  Resp 20  Ht 6' (1.829 m)  Wt 188.5 kg (415 lb 9.1 oz)  BMI 56.35 kg/m2  SpO2 99%  HEMODYNAMICS:    VENTILATOR SETTINGS: Vent Mode:  [-] PSV;CPAP FiO2 (%):  [40 %-100 %] 40 % Set Rate:  [20 bmp] 20 bmp Vt Set:  [560 mL] 560 mL PEEP:  [8 cmH20] 8  cmH20 Pressure Support:  [5 cmH20] 5 cmH20 Plateau Pressure:  [21 cmH20-28 cmH20] 21 cmH20  INTAKE / OUTPUT: I/O last 3 completed shifts: In: 433.3 [I.V.:403.3; NG/GT:30] Out: 1720 [Other:1720]  PHYSICAL EXAMINATION: General:  Morbidly obese AAM. Intubated, sedated.  Neuro:  CN grossly intact. Sedated.  HEENT:  Moist mucous membranes. PERRL Cardiovascular:  Normal rate and rhythm, unable to determine JVD 2/2 body habitus, No edema noted Lungs:  Diminished bilaterally. Crackles at bases.  Abdomen: Soft and non-tender, morbidly obese Musculoskeletal: (-) gross abnormality Skin: No issues  LABS:  BMET  Recent Labs Lab 03/04/16 0851 03/04/16 1541 03/05/16 0521  NA 129* 130* 134*  K 4.5 5.1 3.6  CL 94* 94* 98*  CO2 22 24 24   BUN 51* 55* 24*  CREATININE 8.73* 9.42* 5.79*  GLUCOSE 482* 451* 149*    Electrolytes  Recent Labs Lab 03/04/16 0851 03/04/16 1541 03/05/16 0521  CALCIUM 8.6* 8.2* 8.5*  MG  --  2.0 1.7  PHOS  --  7.1* 3.5    CBC  Recent Labs Lab 03/04/16 0851 03/04/16 1541 03/05/16 0521  WBC 7.8 6.8 8.1  HGB 11.2* 10.7* 10.0*  HCT 37.0* 36.0* 34.1*  PLT 223 223 175    Coag's  Recent Labs Lab 03/04/16 0717 03/04/16 1541 03/05/16 0521  APTT  --  32  --   INR 1.34 1.36 1.33    Sepsis Markers  Recent Labs Lab  03/04/16 1255 03/04/16 1541  LATICACIDVEN 1.04 1.4    ABG  Recent Labs Lab 03/04/16 1218 03/04/16 1630 03/05/16 0351  PHART 7.111* 7.281* 7.378  PCO2ART 80.4* 53.8* 46.2*  PO2ART 70.0* 148.0* 74.0*    Liver Enzymes  Recent Labs Lab 03/04/16 1541  AST 42*  ALT 46  ALKPHOS 157*  BILITOT 1.0  ALBUMIN 3.3*    Cardiac Enzymes No results for input(s): TROPONINI, PROBNP in the last 168 hours.  Glucose  Recent Labs Lab 03/05/16 0025 03/05/16 0130 03/05/16 0237 03/05/16 0351 03/05/16 0500 03/05/16 0615  GLUCAP 152* 142* 188* 186* 156* 158*    Imaging Dg Chest 2 View  03/04/2016  CLINICAL DATA:   Hypoxia. EXAM: CHEST  2 VIEW COMPARISON:  03/01/2016. FINDINGS: Dialysis catheter noted with tip projected over the lower right atrium. Cardiomegaly with the bilateral pulmonary infiltrates most likely secondary to pulmonary edema. No pleural effusion or pneumothorax . IMPRESSION: 1. Dialysis catheter noted with tip projected over the lower right atrium. 2. Cardiomegaly with bilateral pulmonary infiltrates consistent pulmonary edema. Electronically Signed   By: Maisie Fus  Register   On: 03/04/2016 10:35   Dg Chest Port 1 View  03/04/2016  CLINICAL DATA:  Endotracheal tube placement. EXAM: PORTABLE CHEST 1 VIEW COMPARISON:  Same day. FINDINGS: Stable cardiomegaly. Nasogastric tube tip is seen in proximal stomach. Left internal jugular catheter is unchanged in position with tip projected over right atrium. Endotracheal tube is projected over tracheal air shadow with distal tip 4.5 cm above the carina. Hypoinflation of the lungs is noted. No pneumothorax or pleural effusion is noted. No acute pulmonary disease is noted. IMPRESSION: Endotracheal and nasogastric tubes are in grossly good position. Left internal jugular catheter is unchanged in position with tip projected over right atrium. Hypoinflation the lungs is noted without definite acute pulmonary disease. Electronically Signed   By: Lupita Raider, M.D.   On: 03/04/2016 13:36   Dg Abd Portable 1v  03/04/2016  CLINICAL DATA:  Nasogastric tube placement EXAM: PORTABLE ABDOMEN - 1 VIEW COMPARISON:  None. FINDINGS: Nasogastric tube tip and side port are in the stomach. Bowel gas pattern is unremarkable. There is consolidation in the medial left lung base. No free air evident. IMPRESSION: Nasogastric tube tip and side port in stomach. Bowel gas pattern unremarkable. Consolidation left base. Electronically Signed   By: Bretta Bang III M.D.   On: 03/04/2016 13:34   STUDIES:  none  CULTURES: none  ANTIBIOTICS: none  SIGNIFICANT EVENTS: 03/04/16:  Intubated  LINES/TUBES: Left IJ Vas Cath ETT 5/31>>>  DISCUSSION:  Acute hypoxic and hypercarbic respiratory failure in setting of what is likely under treated/untreated OHS/OSA w/ resultant acute on chronic diastolic HF and decompensated cor pulmonale. Failed BIPAP. Now intubated. Will admit to ICU, HD ordered. Hope w/ volume removal his MS will improve.    ASSESSMENT / PLAN:  PULMONARY A: Acute hypercarbic and hypoxic respiratory failure in setting of what is likely decompensated diastolic HF and cor pulmonale due un-treated OSA/OHS P:   full vent settings > doing well on PST. Awake, comfortable, good TV.  Extubate to bipap.  Told pt to use his cpap/bipap at HS and prn. Needs supplies (does not have mask, tubings). Needs f/u as outpt re: cpap/bipap use with OSA. Non compliant.   Education needed prior to discharge concerning non-compliance of CPAP   CARDIOVASCULAR A:  Acute on chronic diastolic dysfunction  Hypertensive Urgency P:  Repeat ECHO for evaluation of diastolic function BP meds on hold 2/2  hypotension/soft BP  RENAL A:   ESRD Gout P:   Cont HD Continue home Allopurinol, Phoslo, Sensipar Continue allopurinol Holding colchicine   GASTROINTESTINAL A:   NPO P:   Nutrition consult for TF recommendations Colace BID for bowel protocol   HEMATOLOGIC A:   DVT Anemia P:  Goal INR 2-3 Pharmacy consult for coumadin titration Transfuse for HgB < 7 No signs of active bleeding  INFECTIOUS A:   No active issue No leukocytosis Afebrile P:   Pan culture with fever > 101.5 No indication for abx at this time   ENDOCRINE A:   Hyperglycemia/DKA BS on admission 488 P:   Insulin gtt started for protocol > gap has closed, follow protocol for DKA/Hyperglycemia.  HgBA1C pending Beta-Hydroxybuterate negative   NEUROLOGIC A:   Acute encephalopathy 2/2 hypercapnea. Improved this am.  P:   RASS goal -1 Most likely due to hypercarbia Continue to monitor  neuro status    Critical care time with this pt today: 35 minutes J. Alexis Frock, MD 03/05/2016, 9:05 AM [facility] Pager [phone] After 3 pm or if no answer, call [phone]

## 2016-03-05 NOTE — Progress Notes (Signed)
eLink Physician-Brief Progress Note Patient Name: Shane MinaReginald D Berendt DOB: 03/10/1966 MRN: [id number]   Date of Service  03/05/2016  HPI/Events of Note  Low sbp, MAP~58 Got coreg ~8pm  eICU Interventions  Hold all HTN for tonight Dec fentanyl rate Ns 250cc bolus.      Intervention Category Major Interventions: Hypotension - evaluation and management  Joelie Schou 03/05/2016, 12:22 AM

## 2016-03-05 NOTE — Procedures (Signed)
Extubation Procedure Note  Patient Details:   Name: Shane MinaReginald D Mood DOB: 06/26/1966 MRN: [id number]   Airway Documentation:  Airway (Active)     Airway 7.5 mm (Active)  Secured at (cm) 25 cm 03/05/2016  8:33 AM  Measured From Lips 03/05/2016  8:33 AM  Secured Location Center 03/05/2016  8:33 AM  Secured By [employer] Tube Holder 03/05/2016  8:33 AM  Tube Holder Repositioned Yes 03/05/2016  8:33 AM  Cuff Pressure (cm H2O) 30 cm H2O 03/05/2016  8:33 AM  Site Condition Dry 03/05/2016  8:00 AM    Evaluation  O2 sats: stable throughout Complications: No apparent complications Patient did tolerate procedure well. Bilateral Breath Sounds: Diminished   Yes  Ok AnisKelly Smith, MA 03/05/2016, 9:47 AM

## 2016-03-05 NOTE — Progress Notes (Signed)
ANTICOAGULATION CONSULT NOTE  Pharmacy Consult for warfarin Indication: hx DVT  Allergies  Allergen Reactions  . Aspirin Other (See Comments)    Reaction:  GI upset     Patient Measurements: Height: 6' (182.9 cm) Weight: (!) 415 lb 9.1 oz (188.5 kg) IBW/kg (Calculated) : 77.6  Vital Signs: Temp: 99.2 F (37.3 C) (06/01 0730) Temp Source: Axillary (06/01 0730) BP: 108/81 mmHg (06/01 0833) Pulse Rate: 110 (06/01 0947)  Labs:  Recent Labs  03/04/16 0717  03/04/16 0851 03/04/16 1541 03/05/16 0521  HGB  --   < > 11.2* 10.7* 10.0*  HCT  --   < > 37.0* 36.0* 34.1*  PLT  --   --  223 223 175  APTT  --   --   --  32  --   LABPROT 16.7*  --   --  16.8* 16.6*  INR 1.34  --   --  1.36 1.33  CREATININE  --   --  8.73* 9.42* 5.79*  < > = values in this interval not displayed.  Estimated Creatinine Clearance: 26.6 mL/min (by C-G formula based on Cr of 5.79).   Assessment: [age] yom presented to the ED with hypoxia requiring intubation in the ED. He is on chronic coumadin for history of DVT. INR is subtherapeutic at 1.34 on admission.  All INRs in EPIC have been sub-therapeutic.  No bleeding noted.   Goal of Therapy:  INR 2-3 Monitor platelets by anticoagulation protocol: Yes   Plan:  - Warfarin 10mg  PO x 1 tonight - Daily INR  Thank you Okey RegalLisa Bethene Hankinson, PharmD [phone] 03/05/2016,9:56 AM

## 2016-03-05 NOTE — Progress Notes (Signed)
Pt taken off Bipap and left on room air, SAT 100% at this time, pt breathing comfortably.

## 2016-03-06 ENCOUNTER — Telehealth: Payer: Self-pay | Admitting: Pulmonary Disease

## 2016-03-06 DIAGNOSIS — G4733 Obstructive sleep apnea (adult) (pediatric): Secondary | ICD-10-CM

## 2016-03-06 NOTE — Telephone Encounter (Signed)
Spoke with pt. He is aware of the below information. Pt would like to proceed with the in lab sleep study. Order will be placed. Will route message back to AD to make him aware.

## 2016-03-06 NOTE — Telephone Encounter (Signed)
   Pt was admitted recently for acute hypercapneic resp fx  and d/c on 6/1.  He was seeing Dr. Maple HudsonYoung for severe OSA. Non compliant.  Had a cpap in 2012 but he does not have it now. Needs a sleep study in lab but case manager here said the earliest is in 4-6 weeks.  Can you please see if pt can have an inlab split night sleep study within the next 1-2 weeks since there are cancellations?  If this cant be done, can we just get a home sleep study?  We are doing this so we can get him cpap or bipap asap, before he gets readmitted.   Thanks.  pls let me know what pt decides on.  AD

## 2016-03-06 NOTE — Progress Notes (Addendum)
Late entry: pt was disch late on 6-1. He left prior to determination if he could get cpap or not. [facility] rep has not been able to find out about prev sleep studies. [facility] will try and ck to see if they can find sleep study for cpap. Have spoke w dr dios and he will contact his office to set up overnite at home til sleep study can be sched. Have faxed form to [facility] to sched new sleep study for pt. Last one found was 2012 and needs newer study for medicare to get cpap. md aware that pt left before this was arranged or pt had cpap. 6-2 11:15a spoke w [facility] and sleep study sched for 6-30 at 8pm. They will call pt and make all arrangements. Spoke w Danielle Dessjemaine at [facility] to let him know of appt. [facility] will cont to work on getting pt cpap. They will contact pt at home.

## 2016-03-07 DIAGNOSIS — T814XXS Infection following a procedure, sequela: Secondary | ICD-10-CM | POA: Diagnosis not present

## 2016-03-07 DIAGNOSIS — E1129 Type 2 diabetes mellitus with other diabetic kidney complication: Secondary | ICD-10-CM | POA: Diagnosis not present

## 2016-03-07 DIAGNOSIS — N186 End stage renal disease: Secondary | ICD-10-CM | POA: Diagnosis not present

## 2016-03-07 DIAGNOSIS — D689 Coagulation defect, unspecified: Secondary | ICD-10-CM | POA: Diagnosis not present

## 2016-03-07 DIAGNOSIS — D509 Iron deficiency anemia, unspecified: Secondary | ICD-10-CM | POA: Diagnosis not present

## 2016-03-07 DIAGNOSIS — D631 Anemia in chronic kidney disease: Secondary | ICD-10-CM | POA: Diagnosis not present

## 2016-03-10 DIAGNOSIS — D509 Iron deficiency anemia, unspecified: Secondary | ICD-10-CM | POA: Diagnosis not present

## 2016-03-10 DIAGNOSIS — N186 End stage renal disease: Secondary | ICD-10-CM | POA: Diagnosis not present

## 2016-03-10 DIAGNOSIS — D631 Anemia in chronic kidney disease: Secondary | ICD-10-CM | POA: Diagnosis not present

## 2016-03-10 DIAGNOSIS — E1129 Type 2 diabetes mellitus with other diabetic kidney complication: Secondary | ICD-10-CM | POA: Diagnosis not present

## 2016-03-10 DIAGNOSIS — D689 Coagulation defect, unspecified: Secondary | ICD-10-CM | POA: Diagnosis not present

## 2016-03-10 DIAGNOSIS — T814XXS Infection following a procedure, sequela: Secondary | ICD-10-CM | POA: Diagnosis not present

## 2016-03-11 ENCOUNTER — Inpatient Hospital Stay: Payer: Medicare Other | Admitting: Acute Care

## 2016-03-11 ENCOUNTER — Telehealth: Payer: Self-pay | Admitting: Pulmonary Disease

## 2016-03-11 ENCOUNTER — Encounter (HOSPITAL_BASED_OUTPATIENT_CLINIC_OR_DEPARTMENT_OTHER): Payer: Medicare Other

## 2016-03-11 DIAGNOSIS — G4733 Obstructive sleep apnea (adult) (pediatric): Secondary | ICD-10-CM

## 2016-03-11 NOTE — Telephone Encounter (Signed)
Spoke with the pt  He is requesting that we send order for new CPAP mask and hose  He uses [facility]  Please advise if okay to send, thanks

## 2016-03-11 NOTE — Telephone Encounter (Signed)
We can send an order for a new cpap mask, tubings, etc from his DME.  He told me he did NOT have a cpap machine when he was admitted recently. pls verify. If he does NOT have a cpap machine. If he does not have a cpap machine, can we order him a new one? He was non compliant in the past so someone may need to see him at the office before a cpap machine or supplies can be ordered.  Thanks.  AD

## 2016-03-11 NOTE — Telephone Encounter (Signed)
Spoke with pt. He states that he does have a CPAP. It wasn't being used because he needs new supplies. Order has been placed. Nothing further was needed.

## 2016-03-12 DIAGNOSIS — D509 Iron deficiency anemia, unspecified: Secondary | ICD-10-CM | POA: Diagnosis not present

## 2016-03-12 DIAGNOSIS — D631 Anemia in chronic kidney disease: Secondary | ICD-10-CM | POA: Diagnosis not present

## 2016-03-12 DIAGNOSIS — T814XXS Infection following a procedure, sequela: Secondary | ICD-10-CM | POA: Diagnosis not present

## 2016-03-12 DIAGNOSIS — N186 End stage renal disease: Secondary | ICD-10-CM | POA: Diagnosis not present

## 2016-03-12 DIAGNOSIS — D689 Coagulation defect, unspecified: Secondary | ICD-10-CM | POA: Diagnosis not present

## 2016-03-12 DIAGNOSIS — M109 Gout, unspecified: Secondary | ICD-10-CM | POA: Diagnosis not present

## 2016-03-12 DIAGNOSIS — E1129 Type 2 diabetes mellitus with other diabetic kidney complication: Secondary | ICD-10-CM | POA: Diagnosis not present

## 2016-03-14 DIAGNOSIS — D509 Iron deficiency anemia, unspecified: Secondary | ICD-10-CM | POA: Diagnosis not present

## 2016-03-14 DIAGNOSIS — N186 End stage renal disease: Secondary | ICD-10-CM | POA: Diagnosis not present

## 2016-03-14 DIAGNOSIS — E1129 Type 2 diabetes mellitus with other diabetic kidney complication: Secondary | ICD-10-CM | POA: Diagnosis not present

## 2016-03-14 DIAGNOSIS — D689 Coagulation defect, unspecified: Secondary | ICD-10-CM | POA: Diagnosis not present

## 2016-03-14 DIAGNOSIS — D631 Anemia in chronic kidney disease: Secondary | ICD-10-CM | POA: Diagnosis not present

## 2016-03-14 DIAGNOSIS — T814XXS Infection following a procedure, sequela: Secondary | ICD-10-CM | POA: Diagnosis not present

## 2016-03-17 DIAGNOSIS — N186 End stage renal disease: Secondary | ICD-10-CM | POA: Diagnosis not present

## 2016-03-17 DIAGNOSIS — D631 Anemia in chronic kidney disease: Secondary | ICD-10-CM | POA: Diagnosis not present

## 2016-03-17 DIAGNOSIS — D689 Coagulation defect, unspecified: Secondary | ICD-10-CM | POA: Diagnosis not present

## 2016-03-17 DIAGNOSIS — E1129 Type 2 diabetes mellitus with other diabetic kidney complication: Secondary | ICD-10-CM | POA: Diagnosis not present

## 2016-03-17 DIAGNOSIS — T814XXS Infection following a procedure, sequela: Secondary | ICD-10-CM | POA: Diagnosis not present

## 2016-03-17 DIAGNOSIS — D509 Iron deficiency anemia, unspecified: Secondary | ICD-10-CM | POA: Diagnosis not present

## 2016-03-19 DIAGNOSIS — N186 End stage renal disease: Secondary | ICD-10-CM | POA: Diagnosis not present

## 2016-03-19 DIAGNOSIS — T814XXS Infection following a procedure, sequela: Secondary | ICD-10-CM | POA: Diagnosis not present

## 2016-03-19 DIAGNOSIS — D509 Iron deficiency anemia, unspecified: Secondary | ICD-10-CM | POA: Diagnosis not present

## 2016-03-19 DIAGNOSIS — D689 Coagulation defect, unspecified: Secondary | ICD-10-CM | POA: Diagnosis not present

## 2016-03-19 DIAGNOSIS — D631 Anemia in chronic kidney disease: Secondary | ICD-10-CM | POA: Diagnosis not present

## 2016-03-19 DIAGNOSIS — E1129 Type 2 diabetes mellitus with other diabetic kidney complication: Secondary | ICD-10-CM | POA: Diagnosis not present

## 2016-03-21 DIAGNOSIS — T814XXS Infection following a procedure, sequela: Secondary | ICD-10-CM | POA: Diagnosis not present

## 2016-03-21 DIAGNOSIS — D631 Anemia in chronic kidney disease: Secondary | ICD-10-CM | POA: Diagnosis not present

## 2016-03-21 DIAGNOSIS — N186 End stage renal disease: Secondary | ICD-10-CM | POA: Diagnosis not present

## 2016-03-21 DIAGNOSIS — E1129 Type 2 diabetes mellitus with other diabetic kidney complication: Secondary | ICD-10-CM | POA: Diagnosis not present

## 2016-03-21 DIAGNOSIS — D509 Iron deficiency anemia, unspecified: Secondary | ICD-10-CM | POA: Diagnosis not present

## 2016-03-21 DIAGNOSIS — D689 Coagulation defect, unspecified: Secondary | ICD-10-CM | POA: Diagnosis not present

## 2016-03-24 DIAGNOSIS — N186 End stage renal disease: Secondary | ICD-10-CM | POA: Diagnosis not present

## 2016-03-24 DIAGNOSIS — D689 Coagulation defect, unspecified: Secondary | ICD-10-CM | POA: Diagnosis not present

## 2016-03-24 DIAGNOSIS — T814XXS Infection following a procedure, sequela: Secondary | ICD-10-CM | POA: Diagnosis not present

## 2016-03-24 DIAGNOSIS — E1129 Type 2 diabetes mellitus with other diabetic kidney complication: Secondary | ICD-10-CM | POA: Diagnosis not present

## 2016-03-24 DIAGNOSIS — D631 Anemia in chronic kidney disease: Secondary | ICD-10-CM | POA: Diagnosis not present

## 2016-03-24 DIAGNOSIS — D509 Iron deficiency anemia, unspecified: Secondary | ICD-10-CM | POA: Diagnosis not present

## 2016-03-26 ENCOUNTER — Emergency Department (HOSPITAL_COMMUNITY): Payer: Medicare Other

## 2016-03-26 ENCOUNTER — Other Ambulatory Visit: Payer: Self-pay

## 2016-03-26 ENCOUNTER — Inpatient Hospital Stay (HOSPITAL_COMMUNITY)
Admission: EM | Admit: 2016-03-26 | Discharge: 2016-04-01 | DRG: 871 | Disposition: A | Discharge status: Home Health | Payer: Medicare Other | Attending: Internal Medicine | Admitting: Internal Medicine

## 2016-03-26 ENCOUNTER — Encounter (HOSPITAL_COMMUNITY): Payer: Self-pay | Admitting: Emergency Medicine

## 2016-03-26 DIAGNOSIS — A419 Sepsis, unspecified organism: Secondary | ICD-10-CM | POA: Diagnosis not present

## 2016-03-26 DIAGNOSIS — M109 Gout, unspecified: Secondary | ICD-10-CM | POA: Diagnosis present

## 2016-03-26 DIAGNOSIS — I132 Hypertensive heart and chronic kidney disease with heart failure and with stage 5 chronic kidney disease, or end stage renal disease: Secondary | ICD-10-CM | POA: Diagnosis present

## 2016-03-26 DIAGNOSIS — E1140 Type 2 diabetes mellitus with diabetic neuropathy, unspecified: Secondary | ICD-10-CM | POA: Diagnosis present

## 2016-03-26 DIAGNOSIS — K219 Gastro-esophageal reflux disease without esophagitis: Secondary | ICD-10-CM | POA: Diagnosis present

## 2016-03-26 DIAGNOSIS — Z886 Allergy status to analgesic agent status: Secondary | ICD-10-CM

## 2016-03-26 DIAGNOSIS — Z86718 Personal history of other venous thrombosis and embolism: Secondary | ICD-10-CM

## 2016-03-26 DIAGNOSIS — Z6841 Body Mass Index (BMI) 40.0 and over, adult: Secondary | ICD-10-CM | POA: Diagnosis not present

## 2016-03-26 DIAGNOSIS — D631 Anemia in chronic kidney disease: Secondary | ICD-10-CM | POA: Diagnosis not present

## 2016-03-26 DIAGNOSIS — R509 Fever, unspecified: Secondary | ICD-10-CM | POA: Diagnosis not present

## 2016-03-26 DIAGNOSIS — E8889 Other specified metabolic disorders: Secondary | ICD-10-CM | POA: Diagnosis present

## 2016-03-26 DIAGNOSIS — IMO0002 Reserved for concepts with insufficient information to code with codable children: Secondary | ICD-10-CM | POA: Diagnosis present

## 2016-03-26 DIAGNOSIS — I472 Ventricular tachycardia: Secondary | ICD-10-CM | POA: Diagnosis present

## 2016-03-26 DIAGNOSIS — N186 End stage renal disease: Secondary | ICD-10-CM

## 2016-03-26 DIAGNOSIS — Z992 Dependence on renal dialysis: Secondary | ICD-10-CM | POA: Diagnosis not present

## 2016-03-26 DIAGNOSIS — D649 Anemia, unspecified: Secondary | ICD-10-CM | POA: Diagnosis present

## 2016-03-26 DIAGNOSIS — Z452 Encounter for adjustment and management of vascular access device: Secondary | ICD-10-CM

## 2016-03-26 DIAGNOSIS — Z79899 Other long term (current) drug therapy: Secondary | ICD-10-CM | POA: Diagnosis not present

## 2016-03-26 DIAGNOSIS — Z8249 Family history of ischemic heart disease and other diseases of the circulatory system: Secondary | ICD-10-CM

## 2016-03-26 DIAGNOSIS — Z7901 Long term (current) use of anticoagulants: Secondary | ICD-10-CM

## 2016-03-26 DIAGNOSIS — L03116 Cellulitis of left lower limb: Secondary | ICD-10-CM | POA: Diagnosis present

## 2016-03-26 DIAGNOSIS — N2581 Secondary hyperparathyroidism of renal origin: Secondary | ICD-10-CM | POA: Diagnosis not present

## 2016-03-26 DIAGNOSIS — K861 Other chronic pancreatitis: Secondary | ICD-10-CM | POA: Diagnosis present

## 2016-03-26 DIAGNOSIS — I82409 Acute embolism and thrombosis of unspecified deep veins of unspecified lower extremity: Secondary | ICD-10-CM | POA: Diagnosis present

## 2016-03-26 DIAGNOSIS — M79662 Pain in left lower leg: Secondary | ICD-10-CM

## 2016-03-26 DIAGNOSIS — Z86711 Personal history of pulmonary embolism: Secondary | ICD-10-CM | POA: Diagnosis not present

## 2016-03-26 DIAGNOSIS — I5032 Chronic diastolic (congestive) heart failure: Secondary | ICD-10-CM | POA: Diagnosis present

## 2016-03-26 DIAGNOSIS — R609 Edema, unspecified: Secondary | ICD-10-CM

## 2016-03-26 DIAGNOSIS — Z79891 Long term (current) use of opiate analgesic: Secondary | ICD-10-CM

## 2016-03-26 DIAGNOSIS — E1122 Type 2 diabetes mellitus with diabetic chronic kidney disease: Secondary | ICD-10-CM | POA: Diagnosis present

## 2016-03-26 DIAGNOSIS — E1165 Type 2 diabetes mellitus with hyperglycemia: Secondary | ICD-10-CM | POA: Diagnosis present

## 2016-03-26 DIAGNOSIS — R6520 Severe sepsis without septic shock: Secondary | ICD-10-CM | POA: Diagnosis not present

## 2016-03-26 DIAGNOSIS — Z794 Long term (current) use of insulin: Secondary | ICD-10-CM | POA: Diagnosis not present

## 2016-03-26 DIAGNOSIS — R600 Localized edema: Secondary | ICD-10-CM | POA: Diagnosis not present

## 2016-03-26 DIAGNOSIS — G4733 Obstructive sleep apnea (adult) (pediatric): Secondary | ICD-10-CM | POA: Diagnosis present

## 2016-03-26 DIAGNOSIS — E118 Type 2 diabetes mellitus with unspecified complications: Secondary | ICD-10-CM | POA: Diagnosis present

## 2016-03-26 DIAGNOSIS — Z89021 Acquired absence of right finger(s): Secondary | ICD-10-CM

## 2016-03-26 DIAGNOSIS — I2781 Cor pulmonale (chronic): Secondary | ICD-10-CM | POA: Diagnosis present

## 2016-03-26 DIAGNOSIS — R7881 Bacteremia: Secondary | ICD-10-CM | POA: Diagnosis not present

## 2016-03-26 DIAGNOSIS — A4101 Sepsis due to Methicillin susceptible Staphylococcus aureus: Secondary | ICD-10-CM | POA: Diagnosis not present

## 2016-03-26 DIAGNOSIS — M7989 Other specified soft tissue disorders: Secondary | ICD-10-CM | POA: Diagnosis not present

## 2016-03-26 DIAGNOSIS — Z4901 Encounter for fitting and adjustment of extracorporeal dialysis catheter: Secondary | ICD-10-CM | POA: Diagnosis not present

## 2016-03-26 DIAGNOSIS — E785 Hyperlipidemia, unspecified: Secondary | ICD-10-CM | POA: Diagnosis present

## 2016-03-26 DIAGNOSIS — L0390 Cellulitis, unspecified: Secondary | ICD-10-CM

## 2016-03-26 DIAGNOSIS — I120 Hypertensive chronic kidney disease with stage 5 chronic kidney disease or end stage renal disease: Secondary | ICD-10-CM | POA: Diagnosis not present

## 2016-03-26 DIAGNOSIS — L03115 Cellulitis of right lower limb: Secondary | ICD-10-CM | POA: Diagnosis present

## 2016-03-26 DIAGNOSIS — E1129 Type 2 diabetes mellitus with other diabetic kidney complication: Secondary | ICD-10-CM | POA: Diagnosis not present

## 2016-03-26 DIAGNOSIS — I4729 Other ventricular tachycardia: Secondary | ICD-10-CM

## 2016-03-26 DIAGNOSIS — Z419 Encounter for procedure for purposes other than remedying health state, unspecified: Secondary | ICD-10-CM

## 2016-03-26 HISTORY — DX: Infection and inflammatory reaction due to other cardiac and vascular devices, implants and grafts, initial encounter: T82.7XXA

## 2016-03-26 HISTORY — DX: Cutaneous abscess of right upper limb: L02.413

## 2016-03-26 LAB — COMPREHENSIVE METABOLIC PANEL
ALK PHOS: 138 U/L — AB (ref 38–126)
ALT: 24 U/L (ref 17–63)
AST: 27 U/L (ref 15–41)
Albumin: 3.7 g/dL (ref 3.5–5.0)
Anion gap: 16 — ABNORMAL HIGH (ref 5–15)
BILIRUBIN TOTAL: 0.9 mg/dL (ref 0.3–1.2)
BUN: 65 mg/dL — ABNORMAL HIGH (ref 6–20)
CALCIUM: 9 mg/dL (ref 8.9–10.3)
CO2: 22 mmol/L (ref 22–32)
CREATININE: 13.22 mg/dL — AB (ref 0.61–1.24)
Chloride: 96 mmol/L — ABNORMAL LOW (ref 101–111)
GFR calc non Af Amer: 4 mL/min — ABNORMAL LOW (ref >60)
GFR, EST AFRICAN AMERICAN: 4 mL/min — AB (ref >60)
GLUCOSE: 257 mg/dL — AB (ref 65–99)
Potassium: 5.3 mmol/L — ABNORMAL HIGH (ref 3.5–5.1)
SODIUM: 134 mmol/L — AB (ref 135–145)
Total Protein: 8.4 g/dL — ABNORMAL HIGH (ref 6.5–8.1)

## 2016-03-26 LAB — I-STAT CG4 LACTIC ACID, ED
LACTIC ACID, VENOUS: 1.65 mmol/L (ref 0.5–2.0)
Lactic Acid, Venous: 2.34 mmol/L (ref 0.5–2.0)

## 2016-03-26 LAB — CBC WITH DIFFERENTIAL/PLATELET
BASOS PCT: 0 %
Basophils Absolute: 0 10*3/uL (ref 0.0–0.1)
EOS ABS: 0 10*3/uL (ref 0.0–0.7)
Eosinophils Relative: 0 %
HCT: 34.3 % — ABNORMAL LOW (ref 39.0–52.0)
Hemoglobin: 10.5 g/dL — ABNORMAL LOW (ref 13.0–17.0)
Lymphocytes Relative: 12 %
Lymphs Abs: 2.1 10*3/uL (ref 0.7–4.0)
MCH: 31.3 pg (ref 26.0–34.0)
MCHC: 30.6 g/dL (ref 30.0–36.0)
MCV: 102.1 fL — ABNORMAL HIGH (ref 78.0–100.0)
MONO ABS: 1 10*3/uL (ref 0.1–1.0)
MONOS PCT: 6 %
NEUTROS PCT: 82 %
Neutro Abs: 14 10*3/uL — ABNORMAL HIGH (ref 1.7–7.7)
Platelets: 226 10*3/uL (ref 150–400)
RBC: 3.36 MIL/uL — ABNORMAL LOW (ref 4.22–5.81)
RDW: 16.9 % — AB (ref 11.5–15.5)
WBC: 17.2 10*3/uL — ABNORMAL HIGH (ref 4.0–10.5)

## 2016-03-26 MED ORDER — SODIUM CHLORIDE 0.9% FLUSH
3.0000 mL | Freq: Two times a day (BID) | INTRAVENOUS | Status: DC
  Administered 2016-03-27 – 2016-04-01 (×11): 3 mL via INTRAVENOUS

## 2016-03-26 MED ORDER — ACETAMINOPHEN 325 MG PO TABS
650.0000 mg | ORAL_TABLET | Freq: Four times a day (QID) | ORAL | Status: DC | PRN
  Administered 2016-03-26 – 2016-03-27 (×2): 650 mg via ORAL
  Filled 2016-03-26: qty 2

## 2016-03-26 MED ORDER — BISACODYL 5 MG PO TBEC
5.0000 mg | DELAYED_RELEASE_TABLET | Freq: Every day | ORAL | Status: DC | PRN
  Filled 2016-03-26: qty 1

## 2016-03-26 MED ORDER — CINACALCET HCL 30 MG PO TABS
60.0000 mg | ORAL_TABLET | Freq: Every day | ORAL | Status: DC
  Administered 2016-03-27 – 2016-04-01 (×4): 60 mg via ORAL
  Filled 2016-03-26 (×6): qty 2

## 2016-03-26 MED ORDER — COLCHICINE 0.6 MG PO TABS: 0.6000 mg | ORAL_TABLET | Freq: Three times a day (TID) | ORAL | Status: DC

## 2016-03-26 MED ORDER — ATORVASTATIN CALCIUM 10 MG PO TABS
10.0000 mg | ORAL_TABLET | Freq: Every day | ORAL | Status: DC
  Administered 2016-03-27 – 2016-04-01 (×5): 10 mg via ORAL
  Filled 2016-03-26 (×5): qty 1

## 2016-03-26 MED ORDER — CLONAZEPAM 1 MG PO TABS
1.0000 mg | ORAL_TABLET | Freq: Two times a day (BID) | ORAL | Status: DC
  Administered 2016-03-27 – 2016-04-01 (×11): 1 mg via ORAL
  Filled 2016-03-26 (×11): qty 1

## 2016-03-26 MED ORDER — RENA-VITE PO TABS
1.0000 | ORAL_TABLET | Freq: Every day | ORAL | Status: DC
  Administered 2016-03-27 – 2016-03-31 (×6): 1 via ORAL
  Filled 2016-03-26 (×7): qty 1

## 2016-03-26 MED ORDER — GABAPENTIN 300 MG PO CAPS
600.0000 mg | ORAL_CAPSULE | Freq: Three times a day (TID) | ORAL | Status: DC
  Administered 2016-03-27 (×2): 600 mg via ORAL
  Filled 2016-03-26 (×2): qty 2

## 2016-03-26 MED ORDER — INSULIN GLARGINE 100 UNIT/ML ~~LOC~~ SOLN
35.0000 [IU] | Freq: Every day | SUBCUTANEOUS | Status: DC
  Administered 2016-03-27 – 2016-03-31 (×6): 35 [IU] via SUBCUTANEOUS
  Filled 2016-03-26 (×7): qty 0.35

## 2016-03-26 MED ORDER — PANTOPRAZOLE SODIUM 40 MG PO TBEC
40.0000 mg | DELAYED_RELEASE_TABLET | Freq: Every day | ORAL | Status: DC
  Administered 2016-03-27 – 2016-04-01 (×5): 40 mg via ORAL
  Filled 2016-03-26 (×5): qty 1

## 2016-03-26 MED ORDER — ONDANSETRON HCL 4 MG PO TABS: 4.0000 mg | ORAL_TABLET | Freq: Four times a day (QID) | ORAL | Status: DC | PRN

## 2016-03-26 MED ORDER — PENTAFLUOROPROP-TETRAFLUOROETH EX AERO: 1.0000 "application " | INHALATION_SPRAY | CUTANEOUS | Status: DC | PRN

## 2016-03-26 MED ORDER — OXYCODONE HCL 5 MG PO TABS
10.0000 mg | ORAL_TABLET | Freq: Two times a day (BID) | ORAL | Status: DC | PRN
  Administered 2016-03-27 – 2016-04-01 (×6): 10 mg via ORAL
  Filled 2016-03-26 (×7): qty 2

## 2016-03-26 MED ORDER — SODIUM CHLORIDE 0.9 % IV SOLN: 100.0000 mL | INTRAVENOUS | Status: DC | PRN

## 2016-03-26 MED ORDER — LANTHANUM CARBONATE 500 MG PO CHEW
1000.0000 mg | CHEWABLE_TABLET | Freq: Three times a day (TID) | ORAL | Status: DC
  Administered 2016-03-27 – 2016-04-01 (×9): 1000 mg via ORAL
  Filled 2016-03-26 (×11): qty 2

## 2016-03-26 MED ORDER — HEPARIN SODIUM (PORCINE) 1000 UNIT/ML DIALYSIS
15000.0000 [IU] | Freq: Once | INTRAMUSCULAR | Status: DC
  Filled 2016-03-26: qty 15

## 2016-03-26 MED ORDER — LIDOCAINE HCL (PF) 1 % IJ SOLN: 5.0000 mL | INTRAMUSCULAR | Status: DC | PRN

## 2016-03-26 MED ORDER — PIPERACILLIN-TAZOBACTAM 3.375 G IVPB 30 MIN
3.3750 g | Freq: Once | INTRAVENOUS | Status: AC
  Administered 2016-03-26: 3.375 g via INTRAVENOUS
  Filled 2016-03-26: qty 50

## 2016-03-26 MED ORDER — SODIUM CHLORIDE 0.9 % IV SOLN
1500.0000 mg | INTRAVENOUS | Status: AC
Start: 2016-03-26 — End: 2016-03-26
  Administered 2016-03-26: 1500 mg via INTRAVENOUS
  Filled 2016-03-26: qty 1500

## 2016-03-26 MED ORDER — RENA-VITE PO TABS: 1.0000 | ORAL_TABLET | Freq: Every day | ORAL | Status: DC

## 2016-03-26 MED ORDER — ALLOPURINOL 300 MG PO TABS
300.0000 mg | ORAL_TABLET | Freq: Two times a day (BID) | ORAL | Status: DC
  Administered 2016-03-27 (×2): 300 mg via ORAL
  Filled 2016-03-26 (×2): qty 1

## 2016-03-26 MED ORDER — CALCIUM ACETATE (PHOS BINDER) 667 MG PO CAPS
2001.0000 mg | ORAL_CAPSULE | Freq: Three times a day (TID) | ORAL | Status: DC
  Administered 2016-03-27 – 2016-04-01 (×9): 2001 mg via ORAL
  Filled 2016-03-26 (×11): qty 3

## 2016-03-26 MED ORDER — SODIUM CHLORIDE 0.9 % IV BOLUS (SEPSIS)
1000.0000 mL | Freq: Once | INTRAVENOUS | Status: AC
  Administered 2016-03-26: 1000 mL via INTRAVENOUS

## 2016-03-26 MED ORDER — VANCOMYCIN HCL IN DEXTROSE 1-5 GM/200ML-% IV SOLN
1000.0000 mg | Freq: Once | INTRAVENOUS | Status: AC
  Administered 2016-03-26: 1000 mg via INTRAVENOUS
  Filled 2016-03-26: qty 200

## 2016-03-26 MED ORDER — COLCHICINE 0.6 MG PO TABS
0.3000 mg | ORAL_TABLET | ORAL | Status: DC
Start: 2016-03-27 — End: 2016-04-01
  Administered 2016-03-30: 0.3 mg via ORAL
  Filled 2016-03-26: qty 1

## 2016-03-26 MED ORDER — CARVEDILOL 3.125 MG PO TABS
3.1250 mg | ORAL_TABLET | Freq: Two times a day (BID) | ORAL | Status: DC
  Administered 2016-03-27 – 2016-04-01 (×8): 3.125 mg via ORAL
  Filled 2016-03-26 (×10): qty 1

## 2016-03-26 MED ORDER — SODIUM CHLORIDE 0.9 % IV SOLN
62.5000 mg | Freq: Once | INTRAVENOUS | Status: AC
  Administered 2016-03-26: 62.5 mg via INTRAVENOUS
  Filled 2016-03-26: qty 5

## 2016-03-26 MED ORDER — ACETAMINOPHEN 325 MG PO TABS
ORAL_TABLET | ORAL | Status: AC
  Filled 2016-03-26: qty 2

## 2016-03-26 MED ORDER — LIDOCAINE-PRILOCAINE 2.5-2.5 % EX CREA: 1.0000 "application " | TOPICAL_CREAM | CUTANEOUS | Status: DC | PRN

## 2016-03-26 MED ORDER — POLYETHYLENE GLYCOL 3350 17 G PO PACK: 17.0000 g | PACK | Freq: Every day | ORAL | Status: DC | PRN

## 2016-03-26 MED ORDER — ONDANSETRON HCL 4 MG/2ML IJ SOLN
4.0000 mg | Freq: Four times a day (QID) | INTRAMUSCULAR | Status: DC | PRN
  Filled 2016-03-26: qty 2

## 2016-03-26 MED ORDER — VANCOMYCIN HCL IN DEXTROSE 1-5 GM/200ML-% IV SOLN: 1000.0000 mg | INTRAVENOUS | Status: DC

## 2016-03-26 MED ORDER — ACETAMINOPHEN 500 MG PO TABS
1000.0000 mg | ORAL_TABLET | Freq: Once | ORAL | Status: AC
  Administered 2016-03-26: 1000 mg via ORAL
  Filled 2016-03-26: qty 2

## 2016-03-26 MED ORDER — INSULIN ASPART 100 UNIT/ML ~~LOC~~ SOLN
0.0000 [IU] | Freq: Three times a day (TID) | SUBCUTANEOUS | Status: DC
  Administered 2016-03-27: 5 [IU] via SUBCUTANEOUS
  Administered 2016-03-27: 11 [IU] via SUBCUTANEOUS

## 2016-03-26 MED ORDER — DOXERCALCIFEROL 4 MCG/2ML IV SOLN
7.0000 ug | INTRAVENOUS | Status: DC
  Administered 2016-03-28: 7 ug via INTRAVENOUS
  Filled 2016-03-26 (×2): qty 4

## 2016-03-26 MED ORDER — DEXTROSE 5 % IV SOLN
2.0000 g | INTRAVENOUS | Status: DC
  Administered 2016-03-26: 2 g via INTRAVENOUS
  Filled 2016-03-26: qty 2

## 2016-03-26 MED ORDER — ACETAMINOPHEN 650 MG RE SUPP: 650.0000 mg | Freq: Four times a day (QID) | RECTAL | Status: DC | PRN

## 2016-03-26 NOTE — Consult Note (Signed)
[facility] Renal Consultation Note    Indication for Consultation:  Management of ESRD/hemodialysis; anemia, hypertension/volume and secondary hyperparathyroidism PCP:  HPI: Shane Hamilton is a 50 y.o. 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 Chronic coumadin therapy for past history of DVT. Patient has LIJ tunneled dialysis catheter and long history of access difficulties.   Patient presented late to HD center this AM with temperature of 101, weakness, was C/O chills, covered in blankets, visibly shaking. Rechecked temperature again, which 102.3 orally. NP notified of patient's condition and asked that patient be sent to ED. Upon arrival to ED, temp was 102.6 HR 136 BP 140/105 O2 sats 92% on RA. WBC 17.6 Lactic acid 2.34 now 1.65 and NS.  Blood cultures have been drawn and patient has been started on Vanc and Zosyn. CXR shows pulmonary vascular congestion. Na 134 K 5.3 Cl 96 Co2 22 BUN 65 Scr 13.2. BS 237. Liver enzymes WNL. Alk Phos 138. Last HD treatment 03/24/16 in which he had 3 hours 44 of 5.5 hour treatment and left tx 1.4 kg above EDW.   Currently patient denies illness, says that he was fasting and just became dehydrated. States "I wasn't that bad-someone is exaggerating!" Says that he started feeling weak this morning, says fever is down and he wants to go home. Denies fever even though current temperature is 102.7. No chills at present, denies SOB, cough, chest pain, abdominal pain, dysuria, flank pain. Discussed current labs with patient, doesn't appear to believe me. Is being admitted for sepsis.  Past Medical History  Diagnosis Date  . Type 2 diabetes mellitus, uncontrolled ([place])        . Morbid obesity  ([place])   . OSA (obstructive sleep apnea)   . Septic shock(785.52)   . Anemia   . Hypertension   . Gout   . LOC (loss of consciousness)   . DVT (deep venous thrombosis) ([place]) 2011  . Cardiomegaly   . Chronic anticoagulation   . Chronic pancreatitis ([place])   . Secondary hyperparathyroidism ([place])   . Pneumonia   . GERD (gastroesophageal reflux disease)   . Seizures ([place])     pt denies  . Headache     migraines  . Shortness of breath dyspnea     with exertion  . ESRD (end stage renal disease) ([place])     [place]  . Kidney stones    Past Surgical History  Procedure Laterality Date  . Av fistula placement    . Amputation finger / thumb Right   . I&d extremity Left 06/20/2015    Procedure: IRRIGATION AND DEBRIDEMENT EXTREMITY;  Surgeon: Katha Cabal, MD;  Location: [facility] ORS;  Service: Vascular;  Laterality: Left;  . Application of wound vac Left 06/20/2015    Procedure: APPLICATION OF WOUND VAC;  Surgeon: Katha Cabal, MD;  Location: [facility] ORS;  Service: Vascular;  Laterality: Left;  . Av fistula placement Left 06/20/2015    Procedure: ARTERIOVENOUS (AV) FISTULA  ligation , excision;  Surgeon: Katha Cabal, MD;  Location: [facility] ORS;  Service: Vascular;  Laterality: Left;  . Peripheral vascular catheterization N/A 06/25/2015    Procedure: Dialysis/Perma Catheter Insertion;  Surgeon: Algernon Huxley, MD;  Location: [place] CV LAB;  Service: Cardiovascular;  Laterality: N/A;  . Insertion of dialysis catheter N/A 06/25/2015    Procedure: place a tunnelled catheter for long term use;  Surgeon: Algernon Huxley, MD;  Location: [facility] ORS;  Service: Vascular;  Laterality: N/A;  . Insertion of dialysis catheter Left 07/25/2015    Procedure: INSERTION OF LEFT INTERNAL JUGULAR DIALYSIS CATHETER;  Surgeon: Mal Misty, MD;  Location: [place];  Service: Vascular;  Laterality: Left;  . Insertion of dialysis catheter Left 08/01/2015    Procedure: REMOVAL OF DIALYSIS CATHETER;  PLACEMNET OF NEW DIALYSIS CATHETER;  Surgeon: Mal Misty, MD;  Location: [place];  Service: Vascular;  Laterality: Left;  . Peripheral vascular catheterization Bilateral 09/13/2015    Procedure: Upper Extremity Venography;  Surgeon: Elam Dutch, MD;  Location: [place] CV LAB;  Service: Cardiovascular;  Laterality: Bilateral;  . Exchange of a dialysis catheter Left 11/13/2015    Procedure: EXCHANGE OF A DIALYSIS CATHETER- LEFT INTERNAL JUGULAR;  Surgeon: Serafina Mitchell, MD;  [facility];  Service: Vascular;  Laterality: Left;   Family History  Problem Relation Age of Onset  . Diabetes Mother   . Hypertension Mother   . Diabetes Father   . Hypertension Father   . Hypertension Brother    Social History:  reports that he has never smoked. He has never used smokeless tobacco. He reports that he does not drink alcohol or use illicit drugs. Allergies  Allergen Reactions  . Aspirin Other (See Comments)    Reaction:  GI upset    Prior to Admission medications   Medication Sig Start Date End Date Taking? Authorizing Provider  allopurinol (ZYLOPRIM) 300 MG tablet Take 300 mg by mouth 2 (two) times daily.   Yes Historical Provider, MD  atorvastatin (LIPITOR) 10 MG tablet Take 10 mg by mouth daily.   Yes Historical Provider, MD  calcium acetate (PHOSLO) 667 MG capsule Take 2,001 mg by mouth 3 (three) times daily with meals.    Yes Historical Provider, MD  carvedilol (COREG) 3.125 MG tablet Take 1 tablet (3.125 mg total) by mouth 2 (two) times daily with a meal. 06/26/15  Yes Vaughan Basta, MD  cinacalcet (SENSIPAR) 60 MG tablet Take 60 mg by mouth daily.   Yes Historical Provider, MD  clonazePAM (KLONOPIN) 1 MG tablet Take 1 mg by mouth 2 (two) times daily.   Yes Historical Provider, MD  colchicine 0.6 MG tablet Take 0.6 mg by mouth 3 (three) times daily.    Yes Historical Provider, MD  gabapentin (NEURONTIN) 300 MG capsule Take 600 mg by mouth every 8 (eight) hours.    Yes  Historical Provider, MD  insulin glargine (LANTUS) 100 UNIT/ML injection Inject 0.35 mLs (35 Units total) into the skin at bedtime. Patient taking differently: Inject 35 Units into the skin at bedtime.  06/26/15  Yes Vaughan Basta, MD  lanthanum (FOSRENOL) 1000 MG chewable tablet Chew 1,000 mg by mouth 3 (three) times daily with meals.    Yes Historical Provider, MD  multivitamin (RENA-VIT) TABS tablet Take 1 tablet by mouth daily.  12/05/14  Yes Historical Provider, MD  omeprazole (PRILOSEC) 20 MG capsule Take 20 mg by mouth 2 (two) times daily before a meal.    Yes Historical Provider, MD  Oxycodone HCl 10 MG TABS Take 1 tablet (10 mg total) by mouth 2 (two) times daily as needed (pain). 03/01/16  Yes Leonard Schwartz, MD  warfarin (COUMADIN) 10 MG tablet Take 10 mg by mouth daily.   Yes Historical  Provider, MD  BD PEN NEEDLE NANO U/F 32G X 4 MM MISC  07/12/15   Historical Provider, MD   No current facility-administered medications for this encounter.   Current Outpatient Prescriptions  Medication Sig Dispense Refill  . allopurinol (ZYLOPRIM) 300 MG tablet Take 300 mg by mouth 2 (two) times daily.    Marland Kitchen atorvastatin (LIPITOR) 10 MG tablet Take 10 mg by mouth daily.    . calcium acetate (PHOSLO) 667 MG capsule Take 2,001 mg by mouth 3 (three) times daily with meals.     . carvedilol (COREG) 3.125 MG tablet Take 1 tablet (3.125 mg total) by mouth 2 (two) times daily with a meal. 60 tablet 0  . cinacalcet (SENSIPAR) 60 MG tablet Take 60 mg by mouth daily.    . clonazePAM (KLONOPIN) 1 MG tablet Take 1 mg by mouth 2 (two) times daily.    . colchicine 0.6 MG tablet Take 0.6 mg by mouth 3 (three) times daily.     Marland Kitchen gabapentin (NEURONTIN) 300 MG capsule Take 600 mg by mouth every 8 (eight) hours.     . insulin glargine (LANTUS) 100 UNIT/ML injection Inject 0.35 mLs (35 Units total) into the skin at bedtime. (Patient taking differently: Inject 35 Units into the skin at bedtime. ) 10 mL 11  . lanthanum  (FOSRENOL) 1000 MG chewable tablet Chew 1,000 mg by mouth 3 (three) times daily with meals.     . multivitamin (RENA-VIT) TABS tablet Take 1 tablet by mouth daily.   3  . omeprazole (PRILOSEC) 20 MG capsule Take 20 mg by mouth 2 (two) times daily before a meal.     . Oxycodone HCl 10 MG TABS Take 1 tablet (10 mg total) by mouth 2 (two) times daily as needed (pain). 20 tablet 0  . warfarin (COUMADIN) 10 MG tablet Take 10 mg by mouth daily.    . BD PEN NEEDLE NANO U/F 32G X 4 MM MISC      Facility-Administered Medications Ordered in Other Encounters  Medication Dose Route Frequency Provider Last Rate Last Dose  . cefUROXime (ZINACEF) 1.5 g in dextrose 5 % 50 mL IVPB  1.5 g Intravenous 30 min Pre-Op Conrad Newington, MD       Labs: Basic Metabolic Panel:  Recent Labs Lab 03/26/16 1235  NA 134*  K 5.3*  CL 96*  CO2 22  GLUCOSE 257*  BUN 65*  CREATININE 13.22*  CALCIUM 9.0   Liver Function Tests:  Recent Labs Lab 03/26/16 1235  AST 27  ALT 24  ALKPHOS 138*  BILITOT 0.9  PROT 8.4*  ALBUMIN 3.7    CBC:  Recent Labs Lab 03/26/16 1235  WBC 17.2*  NEUTROABS 14.0*  HGB 10.5*  HCT 34.3*  MCV 102.1*  PLT 226   Studies/Results: Dg Chest Port 1 View  03/26/2016  CLINICAL DATA:  Fevers EXAM: PORTABLE CHEST 1 VIEW COMPARISON:  03/04/2016 FINDINGS: There is a left IJ catheter with tips in the right atrium. The heart size is mildly enlarged. There is pulmonary vascular congestion present. No airspace consolidation. IMPRESSION: Pulmonary vascular congestion. Electronically Signed   By: Kerby Moors M.D.   On: 03/26/2016 14:15    ROS: As per HPI otherwise negative.  Physical Exam: Filed Vitals:   03/26/16 1445 03/26/16 1500 03/26/16 1512 03/26/16 1515  BP: 96/79 124/84  93/61  Pulse: 130 125  124  Temp:   100.7 F (38.2 C)   TempSrc:   Oral   Resp: 19  19  21  SpO2: 95% 95%  95%     General: Morbidly obese male in NAD Head: Normocephalic, atraumatic, sclera non-icteric,  mucus membranes are moist. Has mild periorbital edema.  Neck: Supple. JVD difficult to assess. PCL Lungs: Bilateral breath sounds distant, decreased in bases with few scattered crackles. No WOB present.  Heart: ST on monitor. HS distant S1,S2. No S3/S4 Abdomen: Morbidly obese nontender with active BS. Liver down 6 cm M-S:  Has difficulty leaning forward for exam. Strength appears at baseline for patient.  Lower extremities: 2+ pitting LE edema multiple fingers missing Neuro: Alert and oriented X 3. Moves all extremities spontaneously. Psych:  Responds to questions appropriately with a normal affect. Dialysis Access: LIJ tunneled HD   Dialysis Orders: [place] [facility] 5 hours 45 minutes 250 NR Optiflux 2.0 K/2.5 Ca 400/800 Heparin: 15000 units IV/8500 units IV mid run Hectoral 7 mcg IV q tx (last PTH 907 02/27/16) Mircera 75 mcg IV q 2 weeks (last dose 03/19/16 HGB 10.0 03/19/16)   Assessment/Plan: 1.  Possible Sepsis: BC pending. Has been started on Vanc/Zosyn. Possible source RIJ TDC. WBC 17.6 No other obvious source.  Will treat through if defervesces and not toxic.  If low bps, on going fevers, d/c cath.  VERY DIFFICULT ACCESS 2.  ESRD -  TTS at [facility]. Shortened tx Tuesday, will had HD today.  3.  Hypertension/volume  - Take 3.25 mg Coreg daily. SBP currently low 90-120s. Attempt 4 liters as tolerated.  4.  Anemia  - HGB 10.5 Recent Dose ESA 03/19/16 Follow HGB. On weekly Fe-give today.  5.  Metabolic bone disease -  Cont Binders/VDRA/sensipar.  6.  Nutrition - Albumin 3.7. Renal/Carb mod diet/renal vit/nepro. 7.  DM: per primary 8.  Chronic Coumadin Rx: HO DVT. Per prmary 9. Morbid Obesity.  10. Nonadherence 11.   Rita H. Owens Shark, NP-C 03/26/2016, 3:25 PM  [employer] [phone] I have seen and examined this patient and agree with the plan of care seen, eval, examined,discussed.  JD .  Maddock Finigan L 03/26/2016, 4:24 PM 2

## 2016-03-26 NOTE — ED Notes (Signed)
Dr. Judd Lienelo requests 1L of NS be given.

## 2016-03-26 NOTE — H&P (Signed)
History and Physical    Shane Hamilton BJY:[id number] DOB: 01-04-1966 DOA: 03/26/2016  PCP: Darrow Bussing, MD  Patient coming from:  dialysis   Chief Complaint: fever  HPI: Shane Hamilton is a 50 y.o. male with multiple medical problems not limited to end-stage renal disease on hemodialysis, uncontrolled diabetes, morbid obesity, history of DVT on chronic coumadin. Patient was hospitalized late last month with DKA and respiratory failure secondary to decompensated diastolic heart failure and cor pulmonale related to OSA  Patient brought to the ED today from dialysis center. Upon arriving to the dialysis center patient was found to be febrile with temperature 101 and chills. He was sent to the ED, no dialysis was performed. Patient felt somewhat weak this morning but had been fasting prior to dialysis so he attributed to lack of oral intake.   Patient has no cough, nausea, vomiting, nor abdominal pain. No skin rashes  ED Course:  Temp 102.6, heart rate 130s White count 17,000.  Initial lactic acid 2.34 down to 1.65 after fluids Potassium 5.3 Code sepsis called 1 L bolus normal saline, Zosyn, vancomycin Vascular congestion on chest x-ray  Review of Systems: As per HPI, otherwise 10 point review of systems negative.   Past Medical History  Diagnosis Date  . Type 2 diabetes mellitus, uncontrolled ([facility])        . Morbid obesity ([facility])   . OSA (obstructive sleep apnea)   . Septic shock(785.52)   . Anemia   . Hypertension   . Gout   . LOC (loss of consciousness)   . DVT (deep venous thrombosis) ([facility]) 2011  . Cardiomegaly   . Chronic anticoagulation   . Chronic pancreatitis ([facility])   . Secondary hyperparathyroidism ([facility])   . Pneumonia   . GERD (gastroesophageal reflux disease)   . Seizures ([facility])     pt denies  . Headache     migraines  . Shortness of breath dyspnea     with exertion  . ESRD (end stage renal disease) ([facility])     T[facility] - [place]  . Kidney stones      Past Surgical History  Procedure Laterality Date  . Av fistula placement    . Amputation finger / thumb Right   . I&d extremity Left 06/20/2015    Procedure: IRRIGATION AND DEBRIDEMENT EXTREMITY;  Surgeon: Renford Dills, MD;  Location: [facility] ORS;  Service: Vascular;  Laterality: Left;  . Application of wound vac Left 06/20/2015    Procedure: APPLICATION OF WOUND VAC;  Surgeon: Renford Dills, MD;  Location: [facility] ORS;  Service: Vascular;  Laterality: Left;  . Av fistula placement Left 06/20/2015    Procedure: ARTERIOVENOUS (AV) FISTULA  ligation , excision;  Surgeon: Renford Dills, MD;  Location: [facility] ORS;  Service: Vascular;  Laterality: Left;  . Peripheral vascular catheterization N/A 06/25/2015    Procedure: Dialysis/Perma Catheter Insertion;  Surgeon: Annice Needy, MD;  Location: [facility] INVASIVE CV LAB;  Service: Cardiovascular;  Laterality: N/A;  . Insertion of dialysis catheter N/A 06/25/2015    Procedure: place a tunnelled catheter for long term use;  Surgeon: Annice Needy, MD;  Location: [facility]S;  Service: Vascular;  Laterality: N/A;  . Insertion of dialysis catheter Left 07/25/2015    Procedure: INSERTION OF LEFT INTERNAL JUGULAR DIALYSIS CATHETER;  Surgeon: Pryor Ochoa, MD;  Location: [facility] OR;  Service: Vascular;  Laterality: Left;  . Insertion of dialysis catheter Left 08/01/2015    Procedure: REMOVAL OF DIALYSIS CATHETER;  PLACEMNET OF NEW DIALYSIS CATHETER;  Surgeon: Pryor Ochoa, MD;  Location: [facility] OR;  Service: Vascular;  Laterality: Left;  . Peripheral vascular catheterization Bilateral 09/13/2015    Procedure: Upper Extremity Venography;  Surgeon: Sherren Kerns, MD;  Location: [facility] INVASIVE CV LAB;  Service: Cardiovascular;  Laterality: Bilateral;  . Exchange of a dialysis catheter Left 11/13/2015    Procedure: EXCHANGE OF A DIALYSIS CATHETER- LEFT INTERNAL JUGULAR;  Surgeon: Nada Libman, MD;  Location: [facility] OR;  Service: Vascular;  Laterality: Left;    Social History    Social History  . Marital Status: Married    Spouse Name: N/A  . Number of Children: N/A  . Years of Education: N/A   Occupational History  . disabled     Social History Main Topics  . Smoking status: Never Smoker   . Smokeless tobacco: Never Used  . Alcohol Use: No  . Drug Use: No  . Sexual Activity: Not on file   Other Topics Concern  . Not on file   Social History Narrative   Lives at home  Allergies  Allergen Reactions  . Aspirin Other (See Comments)    Reaction:  GI upset     Family History  Problem Relation Age of Onset  . Diabetes Mother   . Hypertension Mother   . Diabetes Father   . Hypertension Father   . Hypertension Brother     Prior to Admission medications   Medication Sig Start Date End Date Taking? Authorizing Provider  allopurinol (ZYLOPRIM) 300 MG tablet Take 300 mg by mouth 2 (two) times daily.   Yes Historical Provider, MD  atorvastatin (LIPITOR) 10 MG tablet Take 10 mg by mouth daily.   Yes Historical Provider, MD  calcium acetate (PHOSLO) 667 MG capsule Take 2,001 mg by mouth 3 (three) times daily with meals.    Yes Historical Provider, MD  carvedilol (COREG) 3.125 MG tablet Take 1 tablet (3.125 mg total) by mouth 2 (two) times daily with a meal. 06/26/15  Yes Altamese Dilling, MD  cinacalcet (SENSIPAR) 60 MG tablet Take 60 mg by mouth daily.   Yes Historical Provider, MD  clonazePAM (KLONOPIN) 1 MG tablet Take 1 mg by mouth 2 (two) times daily.   Yes Historical Provider, MD  colchicine 0.6 MG tablet Take 0.6 mg by mouth 3 (three) times daily.    Yes Historical Provider, MD  gabapentin (NEURONTIN) 300 MG capsule Take 600 mg by mouth every 8 (eight) hours.    Yes Historical Provider, MD  insulin glargine (LANTUS) 100 UNIT/ML injection Inject 0.35 mLs (35 Units total) into the skin at bedtime. Patient taking differently: Inject 35 Units into the skin at bedtime.  06/26/15  Yes Altamese Dilling, MD  lanthanum (FOSRENOL) 1000 MG chewable  tablet Chew 1,000 mg by mouth 3 (three) times daily with meals.    Yes Historical Provider, MD  multivitamin (RENA-VIT) TABS tablet Take 1 tablet by mouth daily.  12/05/14  Yes Historical Provider, MD  omeprazole (PRILOSEC) 20 MG capsule Take 20 mg by mouth 2 (two) times daily before a meal.    Yes Historical Provider, MD  Oxycodone HCl 10 MG TABS Take 1 tablet (10 mg total) by mouth 2 (two) times daily as needed (pain). 03/01/16  Yes Nelva Nay, MD  warfarin (COUMADIN) 10 MG tablet Take 10 mg by mouth daily.   Yes Historical Provider, MD  BD PEN NEEDLE NANO U/F 32G X 4 MM MISC  07/12/15   Historical  Provider, MD    Physical Exam: Filed Vitals:   03/26/16 1415 03/26/16 1445 03/26/16 1500 03/26/16 1512  BP: 137/76 96/79 124/84   Pulse: 135 130 125   Temp:    100.7 F (38.2 C)  TempSrc:    Oral  Resp: 23 19 19    SpO2: 98% 95% 95%     Constitutional:  Pleasant, morbidly obese black male in NAD, calm, comfortable Filed Vitals:   03/26/16 1415 03/26/16 1445 03/26/16 1500 03/26/16 1512  BP: 137/76 96/79 124/84   Pulse: 135 130 125   Temp:    100.7 F (38.2 C)  TempSrc:    Oral  Resp: 23 19 19    SpO2: 98% 95% 95%    Eyes: PER, lids and conjunctivae normal ENMT: Mucous membranes are moist. Posterior pharynx clear of any exudate or lesions.Normal dentition.  Neck: normal, supple, no masses Respiratory: clear to auscultation bilaterally, no wheezing, no crackles. Normal respiratory effort. No accessory muscle use.  Cardiovascular: Regular rate and rhythm, no murmurs / rubs / gallops. 2 plus BLE edema. 1+ pedal pulses.  Abdomen: soft, morbidly obese, no tenderness,  Bowel sounds positive.  Musculoskeletal: no clubbing / cyanosis. No joint deformity upper and lower extremities. Good ROM, no contractures. Normal muscle tone.  Skin: no rashes, lesions, ulcers.  Neurologic: CN 2-12 grossly intact. Sensation intact, Strength 5/5 in all 4.  Psychiatric: Normal judgment and insight. Alert and  oriented x 3. Normal mood.   Labs on Admission: I have personally reviewed following labs and imaging studies  Radiological Exams on Admission: Dg Chest Port 1 View  03/26/2016  CLINICAL DATA:  Fevers EXAM: PORTABLE CHEST 1 VIEW COMPARISON:  03/04/2016 FINDINGS: There is a left IJ catheter with tips in the right atrium. The heart size is mildly enlarged. There is pulmonary vascular congestion present. No airspace consolidation. IMPRESSION: Pulmonary vascular congestion. Electronically Signed   By: Signa Kellaylor  Stroud M.D.   On: 03/26/2016 14:15    EKG: Independently reviewed.   EKG Interpretation  Date/Time:  Thursday March 26 2016 13:11:19 EDT Ventricular Rate:  127 PR Interval:    QRS Duration: 147 QT Interval:  327 QTC Calculation: 476 R Axis:   -115 Text Interpretation:  Sinus tachycardia Right bundle branch block Confirmed by DELO  MD, DOUGLAS ([id number]) on 03/26/2016 1:36:10 PM      Assessment/Plan   Sepsis with fever, leukocytosis, fever, tachycardia, tachypnea. Source likely left IJ tunneled cath.  -admit to telemetry -sepsis order set utilized -follow up blood and urine culture -trend  -Given dose of vanco and zosyn in ED. Nephrology recommends vanc and ceftazidime (on back order). Spoke with Pharmacy who will renally dose antibiotics and replace Ceftazidime with comparable antibiotic.   Type 2 diabetes mellitus, uncontrolled. Recent A1c 12.7.  -continue home lantus QHS -SSI, moderate  History of DVT. INR 1.33 --Coumadin per pharmacy  History of diastolic heart failure. Patient was hospitalized late last month with respiratory failure felt to be secondary to decompensated diastolic heart failure and cor pulmonale related to OSA. No echo reports in computer.  -continue home coreg -volume managed by HD   Hyperlipidemia.  Marland Kitchen. --Continue home statin  Gout -Continue home Zyloprim, colchicine   GERD -Continue home twice a day PPI  DVT prophylaxis:   On coumadin Code  Status:   Full code Family Communication:    None.  Disposition Plan:  Discharge home in 24-48 hours              Consults  called:    Nephrology - here now Admission status:  Observation -  telemetry    Willette ClusterPaula Guenther NP [facility] Pager [phone]  If 7PM-7AM, please contact night-coverage [email or web address] Password [email or web address]  03/26/2016, 3:12 PM

## 2016-03-26 NOTE — Procedures (Signed)
I was present at this session.  I have reviewed the session itself and made appropriate changes.  HD via L IJ PC.  Going for 5 L.  Edematous.  bp ok thus far.  Shane Hamilton L 6/22/20175:27 PM

## 2016-03-26 NOTE — ED Notes (Signed)
MD at bedside. 

## 2016-03-26 NOTE — ED Notes (Signed)
Second set of blood cultures obtained prior to antibiotic administration.

## 2016-03-26 NOTE — ED Notes (Signed)
Went to dialysis today and  Did not even start it  And they said pt had a fever, pt states he fasted all day yesterday nothing to eat or drink yesterday

## 2016-03-26 NOTE — ED Notes (Signed)
EKG printed and shown to Dr. Judd Lienelo

## 2016-03-26 NOTE — ED Provider Notes (Signed)
CSN: [id number]     Arrival date & time 03/26/16  1158 History   First MD Initiated Contact with Patient 03/26/16 1225     Chief Complaint  Patient presents with  . Fever     (Consider location/radiation/quality/duration/timing/severity/associated sxs/prior Treatment) HPI Comments: Patient is a 50 year old male with extensive past medical history including type 2 diabetes, end-stage renal disease on hemodialysis, and morbid obesity. He presents for evaluation of fever. He went to dialysis this morning and was found to be febrile and tachycardic and was sent here to be evaluated. The patient denies to me he is experiencing any chest pain, abdominal pain, difficulty breathing, or other symptoms that would explain this fever. He does have an indwelling dialysis catheter in the left subclavian region.  Patient is a 50 y.o. male presenting with fever. The history is provided by the patient.  Fever Max temp prior to arrival:  103 Temp source:  Oral Severity:  Moderate Onset quality:  Sudden Timing:  Constant Progression:  Unchanged Chronicity:  New Worsened by:  Nothing tried Ineffective treatments:  None tried Associated symptoms: no chest pain, no chills, no congestion and no cough     Past Medical History  Diagnosis Date  . Type 2 diabetes mellitus, uncontrolled (HCC)        . Morbid obesity (HCC)   . OSA (obstructive sleep apnea)   . Septic shock(785.52)   . Anemia   . Hypertension   . Gout   . LOC (loss of consciousness)   . DVT (deep venous thrombosis) (HCC) 2011  . Cardiomegaly   . Chronic anticoagulation   . Chronic pancreatitis (HCC)   . Secondary hyperparathyroidism (HCC)   . Pneumonia   . GERD (gastroesophageal reflux disease)   . Seizures (HCC)     pt denies  . Headache     migraines  . Shortness of breath dyspnea     with exertion  . ESRD (end stage renal disease) (HCC)     [facility] [place]  . Kidney stones    Past Surgical History  Procedure  Laterality Date  . Av fistula placement    . Amputation finger / thumb Right   . I&d extremity Left 06/20/2015    Procedure: IRRIGATION AND DEBRIDEMENT EXTREMITY;  Surgeon: Renford Dills, MD;  Location: [facility] ORS;  Service: Vascular;  Laterality: Left;  . Application of wound vac Left 06/20/2015    Procedure: APPLICATION OF WOUND VAC;  Surgeon: Renford Dills, MD;  Location: [facility] ORS;  Service: Vascular;  Laterality: Left;  . Av fistula placement Left 06/20/2015    Procedure: ARTERIOVENOUS (AV) FISTULA  ligation , excision;  Surgeon: Renford Dills, MD;  Location: [facility] ORS;  Service: Vascular;  Laterality: Left;  . Peripheral vascular catheterization N/A 06/25/2015    Procedure: Dialysis/Perma Catheter Insertion;  Surgeon: Annice Needy, MD;  Location: [facility] INVASIVE CV LAB;  Service: Cardiovascular;  Laterality: N/A;  . Insertion of dialysis catheter N/A 06/25/2015    Procedure: place a tunnelled catheter for long term use;  Surgeon: Annice Needy, MD;  Location: [facility] ORS;  Service: Vascular;  Laterality: N/A;  . Insertion of dialysis catheter Left 07/25/2015    Procedure: INSERTION OF LEFT INTERNAL JUGULAR DIALYSIS CATHETER;  Surgeon: Pryor Ochoa, MD;  Location: [facility] OR;  Service: Vascular;  Laterality: Left;  . Insertion of dialysis catheter Left 08/01/2015    Procedure: REMOVAL OF DIALYSIS CATHETER; PLACEMNET OF NEW DIALYSIS CATHETER;  Surgeon: Quita Skye  Hart RochesterLawson, MD;  [facility];  Service: Vascular;  Laterality: Left;  . Peripheral vascular catheterization Bilateral 09/13/2015    Procedure: Upper Extremity Venography;  Surgeon: Sherren Kernsharles E Fields, MD;  [facility];  Service: Cardiovascular;  Laterality: Bilateral;  . Exchange of a dialysis catheter Left 11/13/2015    Procedure: EXCHANGE OF A DIALYSIS CATHETER- LEFT INTERNAL JUGULAR;  Surgeon: Nada LibmanVance W Brabham, MD;  Location: [facility] OR;  Service: Vascular;  Laterality: Left;   Family History  Problem Relation Age of Onset  .  Diabetes Mother   . Hypertension Mother   . Diabetes Father   . Hypertension Father   . Hypertension Brother    Social History  Substance Use Topics  . Smoking status: Never Smoker   . Smokeless tobacco: Never Used  . Alcohol Use: No    Review of Systems  Constitutional: Positive for fever. Negative for chills.  HENT: Negative for congestion.   Respiratory: Negative for cough.   Cardiovascular: Negative for chest pain.  All other systems reviewed and are negative.     Allergies  Aspirin  Home Medications   Prior to Admission medications   Medication Sig Start Date End Date Taking? Authorizing Provider  allopurinol (ZYLOPRIM) 300 MG tablet Take 300 mg by mouth 2 (two) times daily.   Yes Historical Provider, MD  atorvastatin (LIPITOR) 10 MG tablet Take 10 mg by mouth daily.   Yes Historical Provider, MD  calcium acetate (PHOSLO) 667 MG capsule Take 2,001 mg by mouth 3 (three) times daily with meals.    Yes Historical Provider, MD  carvedilol (COREG) 3.125 MG tablet Take 1 tablet (3.125 mg total) by mouth 2 (two) times daily with a meal. 06/26/15  Yes Altamese DillingVaibhavkumar Vachhani, MD  cinacalcet (SENSIPAR) 60 MG tablet Take 60 mg by mouth daily.   Yes Historical Provider, MD  clonazePAM (KLONOPIN) 1 MG tablet Take 1 mg by mouth 2 (two) times daily.   Yes Historical Provider, MD  colchicine 0.6 MG tablet Take 0.6 mg by mouth 3 (three) times daily.    Yes Historical Provider, MD  gabapentin (NEURONTIN) 300 MG capsule Take 600 mg by mouth every 8 (eight) hours.    Yes Historical Provider, MD  insulin glargine (LANTUS) 100 UNIT/ML injection Inject 0.35 mLs (35 Units total) into the skin at bedtime. Patient taking differently: Inject 35 Units into the skin at bedtime.  06/26/15  Yes Altamese DillingVaibhavkumar Vachhani, MD  lanthanum (FOSRENOL) 1000 MG chewable tablet Chew 1,000 mg by mouth 3 (three) times daily with meals.    Yes Historical Provider, MD  multivitamin (RENA-VIT) TABS tablet Take 1 tablet by  mouth daily.  12/05/14  Yes Historical Provider, MD  omeprazole (PRILOSEC) 20 MG capsule Take 20 mg by mouth 2 (two) times daily before a meal.    Yes Historical Provider, MD  Oxycodone HCl 10 MG TABS Take 1 tablet (10 mg total) by mouth 2 (two) times daily as needed (pain). 03/01/16  Yes Nelva Nayobert Beaton, MD  warfarin (COUMADIN) 10 MG tablet Take 10 mg by mouth daily.   Yes Historical Provider, MD  BD PEN NEEDLE NANO U/F 32G X 4 MM MISC  07/12/15   Historical Provider, MD   BP 126/81 mmHg  Pulse 131  Temp(Src) 102.7 F (39.3 C) (Oral)  Resp 22  SpO2 97% Physical Exam  Constitutional: He is oriented to person, place, and time. He appears well-developed and well-nourished. No distress.  HENT:  Head: Normocephalic and atraumatic.  Mouth/Throat:  Oropharynx is clear and moist.  Neck: Normal range of motion. Neck supple.  Cardiovascular: Regular rhythm.  Exam reveals no friction rub.   No murmur heard. Heart is tachycardic  Pulmonary/Chest: Effort normal and breath sounds normal. No respiratory distress. He has no wheezes. He has no rales.  Abdominal: Soft. Bowel sounds are normal. He exhibits no distension. There is no tenderness.  Musculoskeletal: Normal range of motion. He exhibits no edema.  Neurological: He is alert and oriented to person, place, and time. Coordination normal.  Skin: Skin is warm and dry. He is not diaphoretic.  There is a dialysis catheter present in the left subclavian. The surrounding soft tissues appear clear without purulent drainage or erythema.  Nursing note and vitals reviewed.   ED Course  Procedures (including critical care time) Labs Review Labs Reviewed  COMPREHENSIVE METABOLIC PANEL - Abnormal; Notable for the following:    Sodium 134 (*)    Potassium 5.3 (*)    Chloride 96 (*)    Glucose, Bld 257 (*)    BUN 65 (*)    Creatinine, Ser 13.22 (*)    Total Protein 8.4 (*)    Alkaline Phosphatase 138 (*)    GFR calc non Af Amer 4 (*)    GFR calc Af Amer 4  (*)    Anion gap 16 (*)    All other components within normal limits  CBC WITH DIFFERENTIAL/PLATELET - Abnormal; Notable for the following:    WBC 17.2 (*)    RBC 3.36 (*)    Hemoglobin 10.5 (*)    HCT 34.3 (*)    MCV 102.1 (*)    RDW 16.9 (*)    Neutro Abs 14.0 (*)    All other components within normal limits  I-STAT CG4 LACTIC ACID, ED - Abnormal; Notable for the following:    Lactic Acid, Venous 2.34 (*)    All other components within normal limits  CULTURE, BLOOD (ROUTINE X 2)  CULTURE, BLOOD (ROUTINE X 2)  URINE CULTURE  URINALYSIS, ROUTINE W REFLEX MICROSCOPIC (NOT AT [facility])    Imaging Review Dg Chest Port 1 View  03/26/2016  CLINICAL DATA:  Fevers EXAM: PORTABLE CHEST 1 VIEW COMPARISON:  03/04/2016 FINDINGS: There is a left IJ catheter with tips in the right atrium. The heart size is mildly enlarged. There is pulmonary vascular congestion present. No airspace consolidation. IMPRESSION: Pulmonary vascular congestion. Electronically Signed   By: Signa Kell M.D.   On: 03/26/2016 14:15   I have personally reviewed and evaluated these images and lab results as part of my medical decision-making.   EKG Interpretation   Date/Time:  Thursday March 26 2016 13:11:19 EDT Ventricular Rate:  127 PR Interval:    QRS Duration: 147 QT Interval:  327 QTC Calculation: 476 R Axis:   -115 Text Interpretation:  Sinus tachycardia Right bundle branch block  Confirmed by Jessica Seidman  MD, Sherine Cortese ([id number]) on 03/26/2016 1:36:10 PM      MDM   Final diagnoses:  None    Patient with history of end-stage renal disease on hemodialysis. He has an indwelling subclavian dialysis catheter. He was sent here for weakness and was found to be febrile with a temperature of 102.7. I am uncertain as to the etiology of this fever. There is no evidence for infiltrate on his chest x-ray, blood cultures are pending. There is the possibility of the dialysis catheter being the source of infection. I've spoken with  Dr. Darrick Penna from nephrology who is recommending  admission to the hospitalist service. He was given bank and Zosyn due to his fever and tachycardia. He is a dialysis patient who is now overdue for his dialysis. He was given 1 L of saline very cautiously. He will be admitted to the hospitalist service.    Geoffery Lyonsouglas Kerem Gilmer, MD 03/26/16 [id number]

## 2016-03-26 NOTE — ED Notes (Signed)
Doctor at bedside.

## 2016-03-26 NOTE — ED Notes (Signed)
Spoke with Doctor regarding antibiotics

## 2016-03-26 NOTE — ED Notes (Signed)
Spoke with Diplomatic Services operational officersecretary to call a code sepsis

## 2016-03-26 NOTE — Progress Notes (Signed)
Pharmacy Antibiotic Note  Danise MinaReginald D Kahre is a 50 y.o. male admitted on 03/26/2016 with sepsis.  Pharmacy has been consulted for vancomycin and ceftazidime dosing. Patient received zosyn 3.375g*1 and vancomycin 1g*1 in the ED. Pre-HD vancomycin loading dose calculated out to be ~2500g however when talking to the nurse patient is expected to go to HD before we could adjust for this. Therefore will increase first post-HD vanc dose*1. Then follow up with HD schedule for further doses.  Plan: Vancomycin 1500mg  IV with HD today then Vancomycin 1g IV with remaining HD on TTS Ceftazidime 2g IV with HD on TTS Follow up any changes in HD schedule to adjust doses Consider pre-HD level with next scheduled HD session    Temp (24hrs), Avg:102 F (38.9 C), Min:100.7 F (38.2 C), Max:102.7 F (39.3 C)   Recent Labs Lab 03/26/16 1235 03/26/16 1246 03/26/16 1509  WBC 17.2*  --   --   CREATININE 13.22*  --   --   LATICACIDVEN  --  2.34* 1.65    CrCl cannot be calculated (Unknown ideal weight.).    Allergies  Allergen Reactions  . Aspirin Other (See Comments)    Reaction:  GI upset     Antimicrobials this admission: 6/22 zosyn*1 in the ED 6/22 vanc >>  6/22 ceftazidime >>   Dose adjustments this admission: NA  Microbiology results: 6/22 BCx:    Thank you for allowing pharmacy to be a part of this patient's care.  Sherron MondayAubrey N. Myriam Brandhorst, PharmD Clinical Pharmacy Resident Pager: [phone] 03/26/2016 5:09 PM

## 2016-03-27 ENCOUNTER — Encounter (HOSPITAL_COMMUNITY): Payer: Self-pay | Admitting: Internal Medicine

## 2016-03-27 DIAGNOSIS — Z992 Dependence on renal dialysis: Secondary | ICD-10-CM

## 2016-03-27 DIAGNOSIS — E1165 Type 2 diabetes mellitus with hyperglycemia: Secondary | ICD-10-CM

## 2016-03-27 DIAGNOSIS — N186 End stage renal disease: Secondary | ICD-10-CM

## 2016-03-27 DIAGNOSIS — R7881 Bacteremia: Secondary | ICD-10-CM | POA: Diagnosis present

## 2016-03-27 DIAGNOSIS — G4733 Obstructive sleep apnea (adult) (pediatric): Secondary | ICD-10-CM

## 2016-03-27 DIAGNOSIS — R6520 Severe sepsis without septic shock: Secondary | ICD-10-CM

## 2016-03-27 DIAGNOSIS — Z794 Long term (current) use of insulin: Secondary | ICD-10-CM

## 2016-03-27 DIAGNOSIS — E1122 Type 2 diabetes mellitus with diabetic chronic kidney disease: Secondary | ICD-10-CM

## 2016-03-27 DIAGNOSIS — I82599 Chronic embolism and thrombosis of other specified deep vein of unspecified lower extremity: Secondary | ICD-10-CM

## 2016-03-27 DIAGNOSIS — M7989 Other specified soft tissue disorders: Secondary | ICD-10-CM

## 2016-03-27 DIAGNOSIS — A4101 Sepsis due to Methicillin susceptible Staphylococcus aureus: Principal | ICD-10-CM

## 2016-03-27 LAB — BLOOD CULTURE ID PANEL (REFLEXED)
Acinetobacter baumannii: NOT DETECTED
CANDIDA ALBICANS: NOT DETECTED
CANDIDA GLABRATA: NOT DETECTED
CANDIDA KRUSEI: NOT DETECTED
CANDIDA PARAPSILOSIS: NOT DETECTED
CANDIDA TROPICALIS: NOT DETECTED
Carbapenem resistance: NOT DETECTED
ENTEROBACTER CLOACAE COMPLEX: NOT DETECTED
ESCHERICHIA COLI: NOT DETECTED
Enterobacteriaceae species: NOT DETECTED
Enterococcus species: NOT DETECTED
Haemophilus influenzae: NOT DETECTED
KLEBSIELLA OXYTOCA: NOT DETECTED
KLEBSIELLA PNEUMONIAE: NOT DETECTED
LISTERIA MONOCYTOGENES: NOT DETECTED
METHICILLIN RESISTANCE: NOT DETECTED
Neisseria meningitidis: NOT DETECTED
PROTEUS SPECIES: NOT DETECTED
Pseudomonas aeruginosa: NOT DETECTED
SERRATIA MARCESCENS: NOT DETECTED
STREPTOCOCCUS PNEUMONIAE: NOT DETECTED
STREPTOCOCCUS PYOGENES: NOT DETECTED
Staphylococcus aureus (BCID): DETECTED — AB
Staphylococcus species: DETECTED — AB
Streptococcus agalactiae: NOT DETECTED
Streptococcus species: NOT DETECTED
Vancomycin resistance: NOT DETECTED

## 2016-03-27 LAB — BASIC METABOLIC PANEL
Anion gap: 7 (ref 5–15)
BUN: 26 mg/dL — AB (ref 6–20)
CALCIUM: 8.5 mg/dL — AB (ref 8.9–10.3)
CHLORIDE: 99 mmol/L — AB (ref 101–111)
CO2: 29 mmol/L (ref 22–32)
CREATININE: 8.05 mg/dL — AB (ref 0.61–1.24)
GFR calc non Af Amer: 7 mL/min — ABNORMAL LOW (ref >60)
GFR, EST AFRICAN AMERICAN: 8 mL/min — AB (ref >60)
Glucose, Bld: 203 mg/dL — ABNORMAL HIGH (ref 65–99)
Potassium: 4.4 mmol/L (ref 3.5–5.1)
SODIUM: 135 mmol/L (ref 135–145)

## 2016-03-27 LAB — GLUCOSE, CAPILLARY
GLUCOSE-CAPILLARY: 159 mg/dL — AB (ref 65–99)
GLUCOSE-CAPILLARY: 212 mg/dL — AB (ref 65–99)
GLUCOSE-CAPILLARY: 213 mg/dL — AB (ref 65–99)
GLUCOSE-CAPILLARY: 305 mg/dL — AB (ref 65–99)

## 2016-03-27 LAB — CBC
HCT: 32 % — ABNORMAL LOW (ref 39.0–52.0)
Hemoglobin: 9.5 g/dL — ABNORMAL LOW (ref 13.0–17.0)
MCH: 30.8 pg (ref 26.0–34.0)
MCHC: 29.7 g/dL — ABNORMAL LOW (ref 30.0–36.0)
MCV: 103.9 fL — AB (ref 78.0–100.0)
PLATELETS: 205 10*3/uL (ref 150–400)
RBC: 3.08 MIL/uL — AB (ref 4.22–5.81)
RDW: 17.1 % — AB (ref 11.5–15.5)
WBC: 13.5 10*3/uL — ABNORMAL HIGH (ref 4.0–10.5)

## 2016-03-27 LAB — PROCALCITONIN: Procalcitonin: 6.01 ng/mL

## 2016-03-27 LAB — PROTIME-INR
INR: 3.08 — AB (ref 0.00–1.49)
PROTHROMBIN TIME: 31.2 s — AB (ref 11.6–15.2)

## 2016-03-27 LAB — LACTIC ACID, PLASMA
LACTIC ACID, VENOUS: 1.2 mmol/L (ref 0.5–2.0)
Lactic Acid, Venous: 0.8 mmol/L (ref 0.5–2.0)

## 2016-03-27 LAB — APTT: aPTT: 63 seconds — ABNORMAL HIGH (ref 24–37)

## 2016-03-27 MED ORDER — CEFAZOLIN IN D5W 1 GM/50ML IV SOLN
1.0000 g | Freq: Once | INTRAVENOUS | Status: AC
  Administered 2016-03-27: 1 g via INTRAVENOUS
  Filled 2016-03-27: qty 50

## 2016-03-27 MED ORDER — CEFAZOLIN SODIUM-DEXTROSE 2-4 GM/100ML-% IV SOLN
2.0000 g | INTRAVENOUS | Status: DC
  Administered 2016-03-28: 2 g via INTRAVENOUS
  Filled 2016-03-27 (×2): qty 100

## 2016-03-27 MED ORDER — CEFTAZIDIME 2 G IJ SOLR: 2.0000 g | INTRAMUSCULAR | Status: DC

## 2016-03-27 MED ORDER — GABAPENTIN 300 MG PO CAPS
300.0000 mg | ORAL_CAPSULE | Freq: Every day | ORAL | Status: DC
  Administered 2016-03-27 – 2016-03-31 (×5): 300 mg via ORAL
  Filled 2016-03-27 (×5): qty 1

## 2016-03-27 MED ORDER — ALLOPURINOL 300 MG PO TABS
300.0000 mg | ORAL_TABLET | Freq: Every day | ORAL | Status: DC
  Administered 2016-03-28 – 2016-03-31 (×4): 300 mg via ORAL
  Filled 2016-03-27 (×4): qty 1

## 2016-03-27 MED ORDER — CEFAZOLIN SODIUM-DEXTROSE 2-4 GM/100ML-% IV SOLN
2.0000 g | Freq: Once | INTRAVENOUS | Status: AC
  Administered 2016-03-27: 2 g via INTRAVENOUS
  Filled 2016-03-27: qty 100

## 2016-03-27 MED ORDER — CETYLPYRIDINIUM CHLORIDE 0.05 % MT LIQD
7.0000 mL | Freq: Two times a day (BID) | OROMUCOSAL | Status: DC
  Administered 2016-03-27 – 2016-03-31 (×6): 7 mL via OROMUCOSAL

## 2016-03-27 MED ORDER — OXYCODONE HCL 5 MG PO TABS
ORAL_TABLET | ORAL | Status: AC
  Administered 2016-03-28: 10 mg via ORAL
  Filled 2016-03-27: qty 2

## 2016-03-27 MED ORDER — PHYTONADIONE 5 MG PO TABS
5.0000 mg | ORAL_TABLET | Freq: Once | ORAL | Status: AC
  Administered 2016-03-27: 5 mg via ORAL
  Filled 2016-03-27 (×2): qty 1

## 2016-03-27 MED ORDER — CEFAZOLIN SODIUM-DEXTROSE 2-4 GM/100ML-% IV SOLN: 2.0000 g | INTRAVENOUS | Status: DC

## 2016-03-27 NOTE — Consult Note (Addendum)
Shane Hamilton for Infectious Disease  Total days of antibiotics 2        Day 2 vanco/piptazo               Reason for Consult:staph aureus bacteremia    Referring Physician: rama  Active Problems:   Obstructive sleep apnea   Chronic anticoagulation   Type 2 diabetes mellitus, uncontrolled (HCC)   Gout   Fever   Sepsis ([place])    HPI: Shane Hamilton is a 50 y.o. 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 He was admitted on 6/21 as a referral from HD center where he was found to have rigors and temp of 102.73F. In the ED, temp was 102.6, tachycardic with  HR 136, though normotensive. His labs revealed, leukocytosis of WBC 17.6 with 80% N,  Lactic acid 2.34.he was started empirically on vanco and piptazo. Blood cultures have been drawn and subseqeuntly identified MSSA on biofire. CXR shows pulmonary vascular congestion per my read. He states that he felt weak on the morning of admit, and had been fasting prior to HD. He denies any other illness. WBC has improved after 1 day of IV abtx. Thus far, 1 of 4 bottles has growth on his blood cx. He reports feeling better except for both his legs that are swollen, tender to touch.   Past Medical History  Diagnosis Date  . Type 2 diabetes mellitus, uncontrolled ([place])        . Morbid obesity ([place])   . OSA (obstructive sleep apnea)   . Septic shock(785.52)   . Anemia   . Hypertension   . Gout   . LOC (loss of consciousness)   . DVT (deep venous thrombosis) ([place]) 2011  . Cardiomegaly   . Chronic anticoagulation   . Chronic pancreatitis ([place])   . Secondary hyperparathyroidism ([place])   . Pneumonia   . GERD (gastroesophageal reflux disease)   . Seizures ([place])     pt denies  . Headache     migraines  . Shortness of  breath dyspnea     with exertion  . ESRD (end stage renal disease) ([place])     [place]  . Kidney stones     Allergies:  Allergies  Allergen Reactions  . Aspirin Other (See Comments)    Reaction:  GI upset     MEDICATIONS: . allopurinol  300 mg Oral BID  . antiseptic oral rinse  7 mL Mouth Rinse BID  . atorvastatin  10 mg Oral Daily  . calcium acetate  2,001 mg Oral TID WC  . carvedilol  3.125 mg Oral BID WC  . [START ON 03/28/2016]  ceFAZolin (ANCEF) IV  2 g Intravenous Q T,Th,Sat-1800  . cinacalcet  60 mg Oral Q breakfast  . clonazePAM  1 mg Oral BID  . colchicine  0.3 mg Oral Once per day on Mon Thu  . [START ON 03/28/2016] doxercalciferol  7 mcg Intravenous Q T,Th,Sa-HD  . gabapentin  600 mg Oral Q8H  . insulin aspart  0-15 Units Subcutaneous TID WC  . insulin glargine  35 Units Subcutaneous QHS  . lanthanum  1,000 mg Oral TID WC  . multivitamin  1 tablet Oral QHS  . pantoprazole  40 mg Oral Daily  .  sodium chloride flush  3 mL Intravenous Q12H    Social History  Substance Use Topics  . Smoking status: Never Smoker   . Smokeless tobacco: Never Used  . Alcohol Use: No    Family History  Problem Relation Age of Onset  . Diabetes Mother   . Hypertension Mother   . Diabetes Father   . Hypertension Father   . Hypertension Brother     Review of Systems  Constitutional: + fatigue. Negative for fever, chills, diaphoresis, activity change, appetite change, and unexpected weight change.  HENT: Negative for congestion, sore throat, rhinorrhea, sneezing, trouble swallowing and sinus pressure.  Eyes: Negative for photophobia and visual disturbance.  Respiratory: Negative for cough, chest tightness, shortness of breath, wheezing and stridor.  Cardiovascular: Negative for chest pain, palpitations and leg swelling.  Gastrointestinal: Negative for nausea, vomiting, abdominal pain, diarrhea, constipation, blood in stool, abdominal distention and anal bleeding.    Genitourinary: Negative for dysuria, hematuria, flank pain and difficulty urinating.  Musculoskeletal:+ bilateral leg pain. Negative for myalgias, back pain, joint swelling, arthralgias and gait problem.  Skin: Negative for color change, pallor, rash and wound.  Neurological: Negative for dizziness, tremors, weakness and light-headedness.  Hematological: Negative for adenopathy. Does not bruise/bleed easily.  Psychiatric/Behavioral: Negative for behavioral problems, confusion, sleep disturbance, dysphoric mood, decreased concentration and agitation.      OBJECTIVE: Temp:  [98.5 F (36.9 C)-102.7 F (39.3 C)] 101 F (38.3 C) (06/23 0908) Pulse Rate:  [110-135] 118 (06/23 0908) Resp:  [18-27] 22 (06/23 0908) BP: (83-155)/(51-94) 155/94 mmHg (06/23 0908) SpO2:  [87 %-98 %] 93 % (06/23 0908) Weight:  [404 lb 8.7 oz (183.5 kg)-418 lb 6.9 oz (189.8 kg)] 418 lb 6.9 oz (189.8 kg) (06/23 0025) Physical Exam  Constitutional: He is oriented to person, place, and time. He appears well-developed and well-nourished.morbid obesity. Sitting in chair in No distress.  HENT:  Mouth/Throat: Oropharynx is clear and moist. No oropharyngeal exudate.  Cardiovascular: distant heart sounds. Normal rate, regular rhythm and normal heart sounds. Exam reveals no gallop and no friction rub.  No murmur heard.  Pulmonary/Chest: Effort normal and breath sounds normal. No respiratory distress. He has no wheezes.  Abdominal: Soft. Bowel sounds are normal. He exhibits no distension. There is no tenderness.  Lymphadenopathy:  He has no cervical adenopathy.  Neurological: He is alert and oriented to person, place, and time.  Ext: +2 pitting edema BLE Skin: Skin is warm and dry, flaky, hyperpigmentation possibly venous stasis Psychiatric: He has a normal mood and affect. His behavior is normal.     LABS: Results for orders placed or performed during the hospital encounter of 03/26/16 (from the past 48 hour(s))   Comprehensive metabolic panel     Status: Abnormal   Collection Time: 03/26/16 12:35 PM  Result Value Ref Range   Sodium 134 (L) 135 - 145 mmol/L   Potassium 5.3 (H) 3.5 - 5.1 mmol/L   Chloride 96 (L) 101 - 111 mmol/L   CO2 22 22 - 32 mmol/L   Glucose, Bld 257 (H) 65 - 99 mg/dL   BUN 65 (H) 6 - 20 mg/dL   Creatinine, Ser 13.22 (H) 0.61 - 1.24 mg/dL   Calcium 9.0 8.9 - 10.3 mg/dL   Total Protein 8.4 (H) 6.5 - 8.1 g/dL   Albumin 3.7 3.5 - 5.0 g/dL   AST 27 15 - 41 U/L   ALT 24 17 - 63 U/L   Alkaline Phosphatase 138 (H) 38 - 126 U/L  Total Bilirubin 0.9 0.3 - 1.2 mg/dL   GFR calc non Af Amer 4 (L) >60 mL/min   GFR calc Af Amer 4 (L) >60 mL/min    Comment: (NOTE) The eGFR has been calculated using the CKD EPI equation. This calculation has not been validated in all clinical situations. eGFR's persistently <60 mL/min signify possible Chronic Kidney Disease.    Anion gap 16 (H) 5 - 15  CBC WITH DIFFERENTIAL     Status: Abnormal   Collection Time: 03/26/16 12:35 PM  Result Value Ref Range   WBC 17.2 (H) 4.0 - 10.5 K/uL   RBC 3.36 (L) 4.22 - 5.81 MIL/uL   Hemoglobin 10.5 (L) 13.0 - 17.0 g/dL   HCT 34.3 (L) 39.0 - 52.0 %   MCV 102.1 (H) 78.0 - 100.0 fL   MCH 31.3 26.0 - 34.0 pg   MCHC 30.6 30.0 - 36.0 g/dL   RDW 16.9 (H) 11.5 - 15.5 %   Platelets 226 150 - 400 K/uL   Neutrophils Relative % 82 %   Neutro Abs 14.0 (H) 1.7 - 7.7 K/uL   Lymphocytes Relative 12 %   Lymphs Abs 2.1 0.7 - 4.0 K/uL   Monocytes Relative 6 %   Monocytes Absolute 1.0 0.1 - 1.0 K/uL   Eosinophils Relative 0 %   Eosinophils Absolute 0.0 0.0 - 0.7 K/uL   Basophils Relative 0 %   Basophils Absolute 0.0 0.0 - 0.1 K/uL  I-Stat CG4 Lactic Acid, ED  (not at  [facility])     Status: Abnormal   Collection Time: 03/26/16 12:46 PM  Result Value Ref Range   Lactic Acid, Venous 2.34 (HH) 0.5 - 2.0 mmol/L   Comment NOTIFIED PHYSICIAN   Blood Culture (routine x 2)     Status: None (Preliminary result)   Collection  Time: 03/26/16  1:07 PM  Result Value Ref Range   Specimen Description BLOOD RIGHT ARM    Special Requests BOTTLES DRAWN AEROBIC AND ANAEROBIC 5CC    Culture  Setup Time      GRAM POSITIVE COCCI IN CLUSTERS ANAEROBIC BOTTLE ONLY Organism ID to follow CRITICAL RESULT CALLED TO, READ BACK BY AND VERIFIED WITH: C. Nicole Kindred, PHARM D AT 0840 ON [id number] BY Rhea Bleacher    Culture PENDING    Report Status PENDING   Blood Culture ID Panel (Reflexed)     Status: Abnormal   Collection Time: 03/26/16  1:07 PM  Result Value Ref Range   Enterococcus species NOT DETECTED NOT DETECTED   Vancomycin resistance NOT DETECTED NOT DETECTED   Listeria monocytogenes NOT DETECTED NOT DETECTED   Staphylococcus species DETECTED (A) NOT DETECTED    Comment: CRITICAL RESULT CALLED TO, READ BACK BY AND VERIFIED WITH: C. STEWART, PHARM D AT 0840 ON [id number] BY S. YARBROUGH    Staphylococcus aureus DETECTED (A) NOT DETECTED    Comment: CRITICAL RESULT CALLED TO, READ BACK BY AND VERIFIED WITH: C. STEWART, PHARM D AT 0840 ON [id number] BY S. YARBROUGH    Methicillin resistance NOT DETECTED NOT DETECTED   Streptococcus species NOT DETECTED NOT DETECTED   Streptococcus agalactiae NOT DETECTED NOT DETECTED   Streptococcus pneumoniae NOT DETECTED NOT DETECTED   Streptococcus pyogenes NOT DETECTED NOT DETECTED   Acinetobacter baumannii NOT DETECTED NOT DETECTED   Enterobacteriaceae species NOT DETECTED NOT DETECTED   Enterobacter cloacae complex NOT DETECTED NOT DETECTED   Escherichia coli NOT DETECTED NOT DETECTED   Klebsiella oxytoca NOT DETECTED NOT DETECTED   Klebsiella pneumoniae  NOT DETECTED NOT DETECTED   Proteus species NOT DETECTED NOT DETECTED   Serratia marcescens NOT DETECTED NOT DETECTED   Carbapenem resistance NOT DETECTED NOT DETECTED   Haemophilus influenzae NOT DETECTED NOT DETECTED   Neisseria meningitidis NOT DETECTED NOT DETECTED   Pseudomonas aeruginosa NOT DETECTED NOT DETECTED   Candida albicans  NOT DETECTED NOT DETECTED   Candida glabrata NOT DETECTED NOT DETECTED   Candida krusei NOT DETECTED NOT DETECTED   Candida parapsilosis NOT DETECTED NOT DETECTED   Candida tropicalis NOT DETECTED NOT DETECTED  I-Stat CG4 Lactic Acid, ED  (not at  [facility])     Status: None   Collection Time: 03/26/16  3:09 PM  Result Value Ref Range   Lactic Acid, Venous 1.65 0.5 - 2.0 mmol/L  Glucose, capillary     Status: Abnormal   Collection Time: 03/27/16 12:20 AM  Result Value Ref Range   Glucose-Capillary 212 (H) 65 - 99 mg/dL  Lactic acid, plasma     Status: None   Collection Time: 03/27/16 12:32 AM  Result Value Ref Range   Lactic Acid, Venous 1.2 0.5 - 2.0 mmol/L  Procalcitonin     Status: None   Collection Time: 03/27/16 12:32 AM  Result Value Ref Range   Procalcitonin 6.01 ng/mL    Comment:        Interpretation: PCT > 2 ng/mL: Systemic infection (sepsis) is likely, unless other causes are known. (NOTE)         ICU PCT Algorithm               Non ICU PCT Algorithm    ----------------------------     ------------------------------         PCT < 0.25 ng/mL                 PCT < 0.1 ng/mL     Stopping of antibiotics            Stopping of antibiotics       strongly encouraged.               strongly encouraged.    ----------------------------     ------------------------------       PCT level decrease by               PCT < 0.25 ng/mL       >= 80% from peak PCT       OR PCT 0.25 - 0.5 ng/mL          Stopping of antibiotics                                             encouraged.     Stopping of antibiotics           encouraged.    ----------------------------     ------------------------------       PCT level decrease by              PCT >= 0.25 ng/mL       < 80% from peak PCT        AND PCT >= 0.5 ng/mL            Continuing antibiotics  encouraged.       Continuing antibiotics            encouraged.    ----------------------------      ------------------------------     PCT level increase compared          PCT > 0.5 ng/mL         with peak PCT AND          PCT >= 0.5 ng/mL             Escalation of antibiotics                                          strongly encouraged.      Escalation of antibiotics        strongly encouraged.   Protime-INR     Status: Abnormal   Collection Time: 03/27/16 12:32 AM  Result Value Ref Range   Prothrombin Time 31.2 (H) 11.6 - 15.2 seconds   INR 3.08 (H) 0.00 - 1.49  APTT     Status: Abnormal   Collection Time: 03/27/16 12:32 AM  Result Value Ref Range   aPTT 63 (H) 24 - 37 seconds    Comment:        IF BASELINE aPTT IS ELEVATED, SUGGEST PATIENT RISK ASSESSMENT BE USED TO DETERMINE APPROPRIATE ANTICOAGULANT THERAPY.   Basic metabolic panel     Status: Abnormal   Collection Time: 03/27/16  3:15 AM  Result Value Ref Range   Sodium 135 135 - 145 mmol/L   Potassium 4.4 3.5 - 5.1 mmol/L    Comment: DELTA CHECK NOTED   Chloride 99 (L) 101 - 111 mmol/L   CO2 29 22 - 32 mmol/L   Glucose, Bld 203 (H) 65 - 99 mg/dL   BUN 26 (H) 6 - 20 mg/dL   Creatinine, Ser 8.05 (H) 0.61 - 1.24 mg/dL    Comment: DELTA CHECK NOTED   Calcium 8.5 (L) 8.9 - 10.3 mg/dL   GFR calc non Af Amer 7 (L) >60 mL/min   GFR calc Af Amer 8 (L) >60 mL/min    Comment: (NOTE) The eGFR has been calculated using the CKD EPI equation. This calculation has not been validated in all clinical situations. eGFR's persistently <60 mL/min signify possible Chronic Kidney Disease.    Anion gap 7 5 - 15  CBC     Status: Abnormal   Collection Time: 03/27/16  3:15 AM  Result Value Ref Range   WBC 13.5 (H) 4.0 - 10.5 K/uL   RBC 3.08 (L) 4.22 - 5.81 MIL/uL   Hemoglobin 9.5 (L) 13.0 - 17.0 g/dL   HCT 32.0 (L) 39.0 - 52.0 %   MCV 103.9 (H) 78.0 - 100.0 fL   MCH 30.8 26.0 - 34.0 pg   MCHC 29.7 (L) 30.0 - 36.0 g/dL   RDW 17.1 (H) 11.5 - 15.5 %   Platelets 205 150 - 400 K/uL  Lactic acid, plasma     Status: None   Collection  Time: 03/27/16  3:15 AM  Result Value Ref Range   Lactic Acid, Venous 0.8 0.5 - 2.0 mmol/L  Glucose, capillary     Status: Abnormal   Collection Time: 03/27/16  8:01 AM  Result Value Ref Range   Glucose-Capillary 213 (H) 65 - 99 mg/dL  Glucose, capillary     Status: Abnormal   Collection Time: 03/27/16 11:30  AM  Result Value Ref Range   Glucose-Capillary 305 (H) 65 - 99 mg/dL    MICRO: 6/22 blood cx 1 of 4 bottles+ staph aureus by rapid dx IMAGING: Dg Chest Port 1 View  03/26/2016  CLINICAL DATA:  Fevers EXAM: PORTABLE CHEST 1 VIEW COMPARISON:  03/04/2016 FINDINGS: There is a left IJ catheter with tips in the right atrium. The heart size is mildly enlarged. There is pulmonary vascular congestion present. No airspace consolidation. IMPRESSION: Pulmonary vascular congestion. Electronically Signed   By: Kerby Moors M.D.   On: 03/26/2016 14:15    Assessment/Plan:  50yo M with DM2, ESRD on HD via tunneled IJ HD catheter found to have fever, tachycardia, leukocytosis possibly due to early central line infection with sepsis due to methicillin sensitive staph aureus  - recommend to continue with cefazolin renally dosed for bacteremia - will recommend to change out HD line - repeat blood cx to see if bacteremia has cleared  Fever = will watch fever curve, anticipate to improve  ESRD on HD = planned to get HD tonight and hopefully removal of line thereafter. Aim for line holiday  DM = continue with moderate control of BS during hospitalization  LE swelling = thought to be due to fluid overload rather than signs of cellulitis. Will keep monitoring changes on exam.       [place] Antimicrobial Management Team Staphylococcus aureus bacteremia   Staphylococcus aureus bacteremia (SAB) is associated with a high rate of complications and mortality.  Specific aspects of clinical management are critical to optimizing the outcome of patients with SAB.  Therefore, the [facility] Antimicrobial  Management Team [facility]) has initiated an intervention aimed at improving the management of SAB at [facility].  To do so, Infectious Diseases physicians are providing an evidence-based consult for the management of all patients with SAB.     Yes No Comments  Perform follow-up blood cultures (even if the patient is afebrile) to ensure clearance of bacteremia []  [x]  Will schedule for 6/24 once line is removed  Remove vascular catheter and obtain follow-up blood cultures after the removal of the catheter []  [x]  Spoke with Dr. Rockne Menghini who is arrange for his HD line to be removed  Perform echocardiography to evaluate for endocarditis (transthoracic ECHO is 40-50% sensitive, TEE is > 90% sensitive) []  [x]  Please get TTE        Ensure source control []  [x]  HD line is thought to be source of infection  Investigate for "metastatic" sites of infection []  []  n/a  Change antibiotic therapy to ____cefazolin renal dosing_____ [x]  []  Beta-lactam antibiotics are preferred for MSSA due to higher cure rates.   If on Vancomycin, goal trough should be 15 - 20 mcg/mL  Estimated duration of IV antibiotic therapy:  2-4 wk [x]  []  Consult case management for probably prolonged outpatient IV antibiotic therapy

## 2016-03-27 NOTE — Progress Notes (Signed)
  PHARMACY - PHYSICIAN COMMUNICATION CRITICAL VALUE ALERT - BLOOD CULTURE IDENTIFICATION (BCID)  Results for orders placed or performed during the hospital encounter of 03/26/16  Blood Culture ID Panel (Reflexed) (Collected: 03/26/2016  1:07 PM)  Result Value Ref Range   Enterococcus species NOT DETECTED NOT DETECTED   Vancomycin resistance NOT DETECTED NOT DETECTED   Listeria monocytogenes NOT DETECTED NOT DETECTED   Staphylococcus species DETECTED (A) NOT DETECTED   Staphylococcus aureus DETECTED (A) NOT DETECTED   Methicillin resistance NOT DETECTED NOT DETECTED   Streptococcus species NOT DETECTED NOT DETECTED   Streptococcus agalactiae NOT DETECTED NOT DETECTED   Streptococcus pneumoniae NOT DETECTED NOT DETECTED   Streptococcus pyogenes NOT DETECTED NOT DETECTED   Acinetobacter baumannii NOT DETECTED NOT DETECTED   Enterobacteriaceae species NOT DETECTED NOT DETECTED   Enterobacter cloacae complex NOT DETECTED NOT DETECTED   Escherichia coli NOT DETECTED NOT DETECTED   Klebsiella oxytoca NOT DETECTED NOT DETECTED   Klebsiella pneumoniae NOT DETECTED NOT DETECTED   Proteus species NOT DETECTED NOT DETECTED   Serratia marcescens NOT DETECTED NOT DETECTED   Carbapenem resistance NOT DETECTED NOT DETECTED   Haemophilus influenzae NOT DETECTED NOT DETECTED   Neisseria meningitidis NOT DETECTED NOT DETECTED   Pseudomonas aeruginosa NOT DETECTED NOT DETECTED   Candida albicans NOT DETECTED NOT DETECTED   Candida glabrata NOT DETECTED NOT DETECTED   Candida krusei NOT DETECTED NOT DETECTED   Candida parapsilosis NOT DETECTED NOT DETECTED   Candida tropicalis NOT DETECTED NOT DETECTED    Name of physician (or Provider) Contacted: Dr. Darnelle Catalanama and Dr. [name]  Changes to prescribed antibiotics required: Will switch the patient from ceftazidime and vancomycin to Ancef. ID will see.  Cassie L. Roseanne RenoStewart, PharmD PGY2 Infectious Diseases Pharmacy Resident Pager: [phone] 03/27/2016 8:58  AM

## 2016-03-27 NOTE — Care Management Important Message (Signed)
Important Message  Patient Details  Name: Danise MinaReginald D Surber MRN: [id number] Date of Birth: 06/22/1966   Medicare Important Message Given:  Yes    Bernadette HoitShoffner, Berish Bohman Coleman 03/27/2016, 9:57 AM

## 2016-03-27 NOTE — Progress Notes (Signed)
Pharmacy Note  Shane Hamilton is a 50 y.o. male admitted on 03/26/2016 with sepsis.  Pharmacy has been consulted for cefazolin dosing. Patient was initially started on vancomycin and ceftazidime (HD doses).  BCID resulted in MSSA. The patient was transitioned to Cefazolin earlier today.  The patient will need to have their TDC removed over the weekend. Renal is planning HD on both 6/23 and 6/24 - will adjust the Cefazolin doses appropriately.  Pharmacy has also been asked to help with warfarin reversal [facility]. INR 3.08 this morning. The patient has an old DVT noted in 2011 - renal thinks lately it has been utilized more for catheter patency so no bridging would be necessary if it fell <2. Will plan to give Vit K po today and repeat INR in the AM. If >1.5 - will plan to reverse further (likely with IV Vit K at that time).   Plan: 1. Cefazolin 1g post HD this evening 2. Restart Cefazolin 2g IV on TTS @ 1800 starting on 6/25 3. Vit K 5 mg po x 1 dose today 4. F/u INR on 6/24 for need for additional Vit K doses 5. Will continue to follow HD schedule/duration, culture results, LOT, and antibiotic de-escalation plans   Height: 5\' 11"  (180.3 cm) Weight: (!) 418 lb 6.9 oz (189.8 kg) IBW/kg (Calculated) : 75.3  Temp (24hrs), Avg:100.4 F (38 C), Min:98.5 F (36.9 C), Max:101.2 F (38.4 C)   Recent Labs Lab 03/26/16 1235 03/26/16 1246 03/26/16 1509 03/27/16 0032 03/27/16 0315  WBC 17.2*  --   --   --  13.5*  CREATININE 13.22*  --   --   --  8.05*  LATICACIDVEN  --  2.34* 1.65 1.2 0.8    Estimated Creatinine Clearance: 19 mL/min (by C-G formula based on Cr of 8.05).    Allergies  Allergen Reactions  . Aspirin Other (See Comments)    Reaction:  GI upset     Antimicrobials this admission: Zosyn 6/22 x 1 Vanc 6/22 >> 6/23 Ceftazidime 6/22 >> 6/23 Cefazolin 6/23 >>  Microbiology results: 6/22 BCx: MSSA  Thank you for  allowing pharmacy to be a part of this patient's care.  Georgina PillionElizabeth Skipper Dacosta, PharmD, BCPS Clinical Pharmacist Pager: [phone] 03/27/2016 2:39 PM

## 2016-03-27 NOTE — Procedures (Signed)
I have personally attended this patient's dialysis session.   UF goal today 5 liters Pt refuses to run full TMT 3 days in a row (had full TMT yesterday - agrees to run 4 hours today, 4 hours tomorrow) 2K bath TDC 400 Temp 99.3 pre HD  Camille Balynthia Kerra Guilfoil, MD [facility] [phone] Pager 03/27/2016, 4:01 PM

## 2016-03-27 NOTE — Progress Notes (Signed)
Pharmacy Antibiotic Note  Danise MinaReginald D Juneau is a 50 y.o. male admitted on 03/26/2016 with sepsis.  Pharmacy has been consulted for cefazolin dosing. Patient was initially started on vancomycin and ceftazidime (HD doses).  BCID resulted in MSSA. After discussing with Dr. Darnelle Catalanama, will change the patient to cefazolin.  ID will see.   Plan: - Stop vancomycin and ceftazidime - Cefazolin 2 gm IV x 1 now - Cefazolin 2 gm IV qTTS (1800), first dose tomorrow - F/u ID consult  Height: 5\' 11"  (180.3 cm) Weight: (!) 418 lb 6.9 oz (189.8 kg) IBW/kg (Calculated) : 75.3  Temp (24hrs), Avg:100.9 F (38.3 C), Min:98.5 F (36.9 C), Max:102.7 F (39.3 C)   Recent Labs Lab 03/26/16 1235 03/26/16 1246 03/26/16 1509 03/27/16 0032 03/27/16 0315  WBC 17.2*  --   --   --  13.5*  CREATININE 13.22*  --   --   --  8.05*  LATICACIDVEN  --  2.34* 1.65 1.2 0.8    Estimated Creatinine Clearance: 19 mL/min (by C-G formula based on Cr of 8.05).    Allergies  Allergen Reactions  . Aspirin Other (See Comments)    Reaction:  GI upset     Antimicrobials this admission: Zosyn 6/22 x 1 Vanc 6/22 >> 6/23 Ceftazidime 6/22 >> 6/23 Cefazolin 6/23 >>  Microbiology results: 6/22 BCx: MSSA  Thank you for allowing pharmacy to be a part of this patient's care.  Cassie L. Roseanne RenoStewart, PharmD PGY2 Infectious Diseases Pharmacy Resident Pager: [phone] 03/27/2016 9:02 AM

## 2016-03-27 NOTE — Progress Notes (Signed)
[facility] Rounding Note  Subjective/Interval History:   "i'm ready to go home " / tolerated HD yest   Blood cultures + for MSSA and atb's changed to Ancef  Objective Vital signs in last 24 hours: Filed Vitals:   03/27/16 0002 03/27/16 0025 03/27/16 0537 03/27/16 0908  BP:  83/51 108/72 155/94  Pulse:  118 122 118  Temp:  100 F (37.8 C) 98.5 F (36.9 C) 101 F (38.3 C)  TempSrc:  Oral Oral Oral  Resp:  20 22 22   Height: 5\' 11"  (1.803 m)     Weight:  189.8 kg (418 lb 6.9 oz)    SpO2:  94% 91% 93%   Physical Exam: BP 155/94 mmHg  Pulse 118  Temp(Src) 101 F (38.3 C) (Oral)  Resp 22  Ht 5\' 11"  (1.803 m)  Wt 189.8 kg (418 lb 6.9 oz)  BMI 58.39 kg/m2  SpO2 93%  General: Morbidly obese male in NAD Lungs: Bilateral breath sounds distant, decreased in bases and now rare scattered crackles.  Heart: . HS distant  RRR no mur or rub appreciated  Abdomen: Morbidly obese nontender with active BS.  Extremities: 2+ pitting LE edema  And multiple fingers missing Dialysis Access: LIJ tunneled HD  Dressing  Dry and clean /nontender to palpation      Labs: Basic Metabolic Panel:  Recent Labs Lab 03/26/16 1235 03/27/16 0315  NA 134* 135  K 5.3* 4.4  CL 96* 99*  CO2 22 29  GLUCOSE 257* 203*  BUN 65* 26*  CREATININE 13.22* 8.05*  CALCIUM 9.0 8.5*     Recent Labs Lab 03/26/16 1235  AST 27  ALT 24  ALKPHOS 138*  BILITOT 0.9  PROT 8.4*  ALBUMIN 3.7     Recent Labs Lab 03/26/16 1235 03/27/16 0315  WBC 17.2* 13.5*  NEUTROABS 14.0*  --   HGB 10.5* 9.5*  HCT 34.3* 32.0*  MCV 102.1* 103.9*  PLT 226 205     Recent Labs Lab 03/27/16 0020 03/27/16 0801  GLUCAP 212* 213*   Lab Results  Component Value Date   INR 3.08* 03/27/2016   INR 1.33 03/05/2016   INR 1.36 03/04/2016     Studies/Results: Dg Chest Port 1 View  03/26/2016  CLINICAL DATA:  Fevers EXAM: PORTABLE CHEST 1 VIEW COMPARISON:  03/04/2016 FINDINGS: There is a left IJ catheter with tips in the  right atrium. The heart size is mildly enlarged. There is pulmonary vascular congestion present. No airspace consolidation. IMPRESSION: Pulmonary vascular congestion. Electronically Signed   By: Signa Kellaylor  Stroud M.D.   On: 03/26/2016 14:15   Medications:   . acetaminophen      . allopurinol  300 mg Oral BID  . antiseptic oral rinse  7 mL Mouth Rinse BID  . atorvastatin  10 mg Oral Daily  . calcium acetate  2,001 mg Oral TID WC  . carvedilol  3.125 mg Oral BID WC  . [START ON 03/28/2016]  ceFAZolin (ANCEF) IV  2 g Intravenous Q T,Th,Sat-1800  . cinacalcet  60 mg Oral Q breakfast  . clonazePAM  1 mg Oral BID  . colchicine  0.3 mg Oral Once per day on Mon Thu  . [START ON 03/28/2016] doxercalciferol  7 mcg Intravenous Q T,Th,Sa-HD  . gabapentin  600 mg Oral Q8H  . insulin aspart  0-15 Units Subcutaneous TID WC  . insulin glargine  35 Units Subcutaneous QHS  . lanthanum  1,000 mg Oral TID WC  . multivitamin  1 tablet  Oral QHS  . pantoprazole  40 mg Oral Daily  . sodium chloride flush  3 mL Intravenous Q12H   Dialysis Orders: [facility] TTS 5 hours 45 minutes 250 NR Optiflux 2.0 K/2.5 Ca 400/800 Heparin: [id number] units IV/8500 units IV mid run Hectoral 7 mcg IV q tx (last PTH 907 02/27/16) Mircera 75 mcg IV q 2 weeks (last dose 03/19/16 HGB 10.0 03/19/16)  Problem/Plan:  1.  MSSA bacteremia in HD pt with catheter that has been in for some time, last exchanegd by IR on March 2017, and is very difficult access. Dr. Fredia SorrowYamagata in IR will be assisting with catheter removal and replacement (with catheter holiday) - will plan dialysis today and tomorrow to get as dry as possible, then catheter removal hopefully tomorrow (if INR down enough).   2. [facility]. Had full  HD last pm after having  Shortened tx Tuesday,. Serial HD in anticipation of cath removal 3. Hypertension/volume - Takes 3.25 mg Coreg daily. SBP currently low 90-120s.  Last pm had  5 liters uf and tolerated.  4. Anemia - HGB  10.5> 9.5 this am  Recent Dose ESA 03/19/16 Follow HGB. On weekly Fe-give today.  5. Metabolic bone disease - Cont Binders/VDRA/sensipar.  6. Nutrition - Albumin 3.7. Renal/Carb mod diet/renal vit/nepro. 7. DM: per primary 8. Chronic Coumadin Rx: HO DVT (DVT was REMOTE and more recently coumadin was for catheter patency so bridging not needed - will be reversed for catheter procedure 9. Morbid Obesity.  10. Nonadherence   Lenny Pastelavid Zeyfang, PA-C [facility] [phone] 03/27/2016,11:12 AM  LOS: 1 day   I have seen and examined this patient and agree with plan with highlighted additions. VERY difficult situation with super morbidly obese dialyisis patient with a catheter related MSSA bacteremia - in the [facility] [place] [facility] catheter needs to be removed, give him a catheter holiday, then replace - he is a VERY difficult access. I have spoken with Dr. Fredia SorrowYamagata who will evaluate him for removal and replacement, with plans to remove catheter when INR down (will start reversing today). In the meantime will plan HD today and tomorrow to get him as dry as possible in preparation for catheter holiday. I have talked w/ the pt about limiting his fluid intake.  On ANCEF. Pharmacy will reverse coumadin.  Camille Balynthia Joanathan Affeldt, MD [facility] [phone] Pager 03/27/2016, 2:31 PM
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
## 2016-03-28 ENCOUNTER — Inpatient Hospital Stay (HOSPITAL_COMMUNITY): Payer: Medicare Other

## 2016-03-28 LAB — RENAL FUNCTION PANEL
Albumin: 2.9 g/dL — ABNORMAL LOW (ref 3.5–5.0)
Anion gap: 12 (ref 5–15)
BUN: 27 mg/dL — AB (ref 6–20)
CALCIUM: 8.2 mg/dL — AB (ref 8.9–10.3)
CHLORIDE: 93 mmol/L — AB (ref 101–111)
CO2: 26 mmol/L (ref 22–32)
CREATININE: 6.88 mg/dL — AB (ref 0.61–1.24)
GFR calc non Af Amer: 8 mL/min — ABNORMAL LOW (ref >60)
GFR, EST AFRICAN AMERICAN: 10 mL/min — AB (ref >60)
Glucose, Bld: 243 mg/dL — ABNORMAL HIGH (ref 65–99)
Phosphorus: 3.6 mg/dL (ref 2.5–4.6)
Potassium: 3.8 mmol/L (ref 3.5–5.1)
Sodium: 131 mmol/L — ABNORMAL LOW (ref 135–145)

## 2016-03-28 LAB — GLUCOSE, CAPILLARY
Glucose-Capillary: 202 mg/dL — ABNORMAL HIGH (ref 65–99)
Glucose-Capillary: 138 mg/dL — ABNORMAL HIGH (ref 65–99)
Glucose-Capillary: 151 mg/dL — ABNORMAL HIGH (ref 65–99)
Glucose-Capillary: 172 mg/dL — ABNORMAL HIGH (ref 65–99)

## 2016-03-28 MED ORDER — INSULIN ASPART 100 UNIT/ML ~~LOC~~ SOLN
0.0000 [IU] | Freq: Every day | SUBCUTANEOUS | Status: DC
  Administered 2016-03-29: 3 [IU] via SUBCUTANEOUS

## 2016-03-28 MED ORDER — NAPHAZOLINE-GLYCERIN 0.012-0.2 % OP SOLN
1.0000 [drp] | Freq: Four times a day (QID) | OPHTHALMIC | Status: DC | PRN
  Administered 2016-03-28 – 2016-03-29 (×2): 1 [drp] via OPHTHALMIC
  Filled 2016-03-28: qty 15

## 2016-03-28 MED ORDER — DOXERCALCIFEROL 4 MCG/2ML IV SOLN
INTRAVENOUS | Status: AC
  Administered 2016-03-28: 7 ug via INTRAVENOUS
  Filled 2016-03-28: qty 4

## 2016-03-28 MED ORDER — LIDOCAINE HCL 1 % IJ SOLN
INTRAMUSCULAR | Status: AC
  Filled 2016-03-28: qty 20

## 2016-03-28 MED ORDER — VITAMIN K1 10 MG/ML IJ SOLN
2.5000 mg | Freq: Once | INTRAVENOUS | Status: AC
  Administered 2016-03-28: 2.5 mg via INTRAVENOUS
  Filled 2016-03-28: qty 0.25

## 2016-03-28 MED ORDER — DARBEPOETIN ALFA 100 MCG/0.5ML IJ SOSY
100.0000 ug | PREFILLED_SYRINGE | INTRAMUSCULAR | Status: DC
  Administered 2016-03-28: 100 ug via INTRAVENOUS
  Filled 2016-03-28: qty 0.5

## 2016-03-28 MED ORDER — CHLORHEXIDINE GLUCONATE 4 % EX LIQD
CUTANEOUS | Status: AC
  Filled 2016-03-28: qty 15

## 2016-03-28 MED ORDER — INSULIN ASPART 100 UNIT/ML ~~LOC~~ SOLN
0.0000 [IU] | Freq: Three times a day (TID) | SUBCUTANEOUS | Status: DC
  Administered 2016-03-29 (×3): 3 [IU] via SUBCUTANEOUS
  Administered 2016-03-30: 5 [IU] via SUBCUTANEOUS
  Administered 2016-03-30 – 2016-03-31 (×2): 3 [IU] via SUBCUTANEOUS
  Administered 2016-04-01: 2 [IU] via SUBCUTANEOUS
  Administered 2016-04-01: 3 [IU] via SUBCUTANEOUS

## 2016-03-28 MED ORDER — INSULIN ASPART 100 UNIT/ML ~~LOC~~ SOLN
3.0000 [IU] | Freq: Three times a day (TID) | SUBCUTANEOUS | Status: DC
  Administered 2016-03-29 – 2016-03-30 (×4): 3 [IU] via SUBCUTANEOUS

## 2016-03-28 MED ORDER — DARBEPOETIN ALFA 100 MCG/0.5ML IJ SOSY
PREFILLED_SYRINGE | INTRAMUSCULAR | Status: AC
  Administered 2016-03-28: 100 ug via INTRAVENOUS
  Filled 2016-03-28: qty 0.5

## 2016-03-28 NOTE — Progress Notes (Signed)
PHARMACY - PHYSICIAN COMMUNICATION CRITICAL VALUE ALERT - BLOOD CULTURE IDENTIFICATION (BCID)  Called by Verlon AuLeslie in lab: 1 aerobic bottle + for gram positive cocci.  Pt on Ancef for MSSA bacteremia.    Name of physician (or Provider) Contacted: none  Changes to prescribed antibiotics required: none  Herby AbrahamMichelle T. Cayde Held, Pharm.D. [phone] 03/28/2016 7:25 AM
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
## 2016-03-28 NOTE — Progress Notes (Addendum)
Progress Note    Shane MinaReginald D Hamilton  AVW:[id number] DOB: 01/27/1966  DOA: 03/26/2016 PCP: Darrow BussingKOIRALA,DIBAS, MD    Brief Narrative:   Shane Hamilton is an 50 y.o. male with a Past Medical History of DM, obesity, anemia, HTN, gout, PT, hyperparathyroidism/secondary, GERD, seizures, ESRD on dialysis who Was admitted 03/26/16 with Sepsis. Blood cultures growing MSSA.  Assessment/Plan:   Principal Problem:   Sepsis (HCC) secondary to MSSA bacteremia Admitted and blood cultures obtained. Initially placed on broad-spectrum antibiotics with Zosyn and vancomycin. Antibiotics narrowed to Ancef secondary to MSSA + blood cultures. ID consulting. We'll need to address ongoing source and change out tunneled IJ HD catheter. Interventional radiology to remove catheter and place new catheter.  Active Problems:   Obstructive sleep apnea CPAP ordered.    DVT (deep venous thrombosis) (HCC)/chronic anticoagulation  Hold Coumadin in anticipation of surgery for removing tunneled IJ catheter.    Type 2 diabetes mellitus, uncontrolled (HCC) /diabetic neuropathy Currently being managed with moderate scale SSI 3 times a day and Lantus 35 units daily. CBGs 112-305. Add 3 units of meal coverage. Continue Neurontin for neuropathy.    Gout Continue allopurinol.    End-stage renal disease on hemodialysis (HCC) HD per nephrology. Continue PhosLo, Hectorol and Sensipar.  Family Communication/Anticipated D/C date and plan/Code Status   DVT prophylaxis: On coumadin, on hold but INR therapeutic. Code Status: Full Code.  Family Communication: No family present today. Disposition Plan: Home when stable.   Medical Consultants:    Nephrology  Vascular Surgery   Procedures:    Anti-Infectives:   Zosyn 03/26/16---> 03/27/16 Vancomycin 03/26/16---> 03/27/16 Fortaz 03/26/16---> 03/27/16 Ancef 03/27/16--->   Subjective:   Shane Hamilton is sleepy today, currently receiving hemodialysis. Low grade fever  noted. No nausea or vomiting, no chest pain or dyspnea.  Objective:    Filed Vitals:   03/27/16 2108 03/27/16 2338 03/28/16 0140 03/28/16 0630  BP: 91/55   108/43  Pulse: 115 111  116  Temp: 100.4 F (38 C)  99.5 F (37.5 C) 99.1 F (37.3 C)  TempSrc: Oral  Oral Oral  Resp: 16 20  16   Height:      Weight:      SpO2: 93% 96%  97%    Intake/Output Summary (Last 24 hours) at 03/28/16 0839 Last data filed at 03/28/16 0833  Gross per 24 hour  Intake    416 ml  Output   3500 ml  Net  -3084 ml   Filed Weights   03/27/16 0025 03/27/16 1608 03/27/16 2020  Weight: 189.8 kg (418 lb 6.9 oz) 190.511 kg (420 lb) 187.5 kg (413 lb 5.8 oz)    Exam: General exam: Appears calm and comfortable. Receiving HD. Respiratory system: Clear to auscultation. Respiratory effort normal. Cardiovascular system: S1 & S2 heard, RRR. No JVD,  rubs, gallops or clicks. No murmurs. Gastrointestinal system: Abdomen is nondistended, soft and nontender. No organomegaly or masses felt. Normal bowel sounds heard. Central nervous system: Alert and oriented. No focal neurological deficits. Extremities: Erythema and swelling to the lower extremities. Skin: Scarring left upper arm from AV graft. Psychiatry: Judgement and insight appear normal. Mood & affect appropriate.   Data Reviewed:   I have personally reviewed following labs and imaging studies:  Labs: Basic Metabolic Panel:  Recent Labs Lab 03/26/16 1235 03/27/16 0315 03/28/16 0546  NA 134* 135 131*  K 5.3* 4.4 3.8  CL 96* 99* 93*  CO2 22 29 26   GLUCOSE 257* 203*  243*  BUN 65* 26* 27*  CREATININE 13.22* 8.05* 6.88*  CALCIUM 9.0 8.5* 8.2*  PHOS  --   --  3.6   GFR Estimated Creatinine Clearance: 22.1 mL/min (by C-G formula based on Cr of 6.88). Liver Function Tests:  Recent Labs Lab 03/26/16 1235 03/28/16 0546  AST 27  --   ALT 24  --   ALKPHOS 138*  --   BILITOT 0.9  --   PROT 8.4*  --   ALBUMIN 3.7 2.9*   Coagulation  profile  Recent Labs Lab 03/27/16 0032  INR 3.08*    CBC:  Recent Labs Lab 03/26/16 1235 03/27/16 0315  WBC 17.2* 13.5*  NEUTROABS 14.0*  --   HGB 10.5* 9.5*  HCT 34.3* 32.0*  MCV 102.1* 103.9*  PLT 226 205   CBG:  Recent Labs Lab 03/27/16 0020 03/27/16 0801 03/27/16 1130 03/27/16 2100 03/28/16 0754  GLUCAP 212* 213* 305* 159* 202*   Sepsis Labs:  Recent Labs Lab 03/26/16 1235 03/26/16 1246 03/26/16 1509 03/27/16 0032 03/27/16 0315  PROCALCITON  --   --   --  6.01  --   WBC 17.2*  --   --   --  13.5*  LATICACIDVEN  --  2.34* 1.65 1.2 0.8   Urine analysis:    Component Value Date/Time   COLORURINE YELLOW 07/07/2010 1716   APPEARANCEUR CLEAR 07/07/2010 1716   LABSPEC 1.014 07/07/2010 1716   PHURINE 7.0 07/07/2010 1716   GLUCOSEU NEGATIVE 07/07/2010 1716   HGBUR SMALL* 07/07/2010 1716   BILIRUBINUR NEGATIVE 07/07/2010 1716   KETONESUR NEGATIVE 07/07/2010 1716   PROTEINUR 100* 07/07/2010 1716   UROBILINOGEN 0.2 07/07/2010 1716   NITRITE NEGATIVE 07/07/2010 1716   LEUKOCYTESUR NEGATIVE 07/07/2010 1716   Microbiology Recent Results (from the past 240 hour(s))  Blood Culture (routine x 2)     Status: None (Preliminary result)   Collection Time: 03/26/16 12:37 PM  Result Value Ref Range Status   Specimen Description BLOOD LEFT ANTECUBITAL  Final   Special Requests   Final    BOTTLES DRAWN AEROBIC AND ANAEROBIC 10CC AER 5CC ANA   Culture  Setup Time   Final    GRAM POSITIVE COCCI IN CLUSTERS AEROBIC BOTTLE ONLY CRITICAL RESULT CALLED TO, READ BACK BY AND VERIFIED WITH: Oneta RackM BELL,PHARMD AT 0622 03/28/16 BY L BENFIELD     Culture PENDING  Incomplete   Report Status PENDING  Incomplete  Blood Culture (routine x 2)     Status: None (Preliminary result)   Collection Time: 03/26/16  1:07 PM  Result Value Ref Range Status   Specimen Description BLOOD RIGHT ARM  Final   Special Requests BOTTLES DRAWN AEROBIC AND ANAEROBIC 5CC  Final   Culture  Setup Time    Final    GRAM POSITIVE COCCI IN CLUSTERS ANAEROBIC BOTTLE ONLY CRITICAL RESULT CALLED TO, READ BACK BY AND VERIFIED WITH: C. STEWART, PHARM D AT 0840 ON [id number] BY S. YARBROUGH    Culture NO GROWTH 1 DAY  Final   Report Status PENDING  Incomplete  Blood Culture ID Panel (Reflexed)     Status: Abnormal   Collection Time: 03/26/16  1:07 PM  Result Value Ref Range Status   Enterococcus species NOT DETECTED NOT DETECTED Final   Vancomycin resistance NOT DETECTED NOT DETECTED Final   Listeria monocytogenes NOT DETECTED NOT DETECTED Final   Staphylococcus species DETECTED (A) NOT DETECTED Final    Comment: CRITICAL RESULT CALLED TO, READ BACK BY AND VERIFIED  WITH: C. STEWART, PHARM D AT 0840 ON [id number] BY S. YARBROUGH    Staphylococcus aureus DETECTED (A) NOT DETECTED Final    Comment: CRITICAL RESULT CALLED TO, READ BACK BY AND VERIFIED WITH: C. STEWART, PHARM D AT 0840 ON [id number] BY S. YARBROUGH    Methicillin resistance NOT DETECTED NOT DETECTED Final   Streptococcus species NOT DETECTED NOT DETECTED Final   Streptococcus agalactiae NOT DETECTED NOT DETECTED Final   Streptococcus pneumoniae NOT DETECTED NOT DETECTED Final   Streptococcus pyogenes NOT DETECTED NOT DETECTED Final   Acinetobacter baumannii NOT DETECTED NOT DETECTED Final   Enterobacteriaceae species NOT DETECTED NOT DETECTED Final   Enterobacter cloacae complex NOT DETECTED NOT DETECTED Final   Escherichia coli NOT DETECTED NOT DETECTED Final   Klebsiella oxytoca NOT DETECTED NOT DETECTED Final   Klebsiella pneumoniae NOT DETECTED NOT DETECTED Final   Proteus species NOT DETECTED NOT DETECTED Final   Serratia marcescens NOT DETECTED NOT DETECTED Final   Carbapenem resistance NOT DETECTED NOT DETECTED Final   Haemophilus influenzae NOT DETECTED NOT DETECTED Final   Neisseria meningitidis NOT DETECTED NOT DETECTED Final   Pseudomonas aeruginosa NOT DETECTED NOT DETECTED Final   Candida albicans NOT DETECTED NOT DETECTED  Final   Candida glabrata NOT DETECTED NOT DETECTED Final   Candida krusei NOT DETECTED NOT DETECTED Final   Candida parapsilosis NOT DETECTED NOT DETECTED Final   Candida tropicalis NOT DETECTED NOT DETECTED Final    Radiology: Dg Chest Port 1 View  03/26/2016  CLINICAL DATA:  Fevers EXAM: PORTABLE CHEST 1 VIEW COMPARISON:  03/04/2016 FINDINGS: There is a left IJ catheter with tips in the right atrium. The heart size is mildly enlarged. There is pulmonary vascular congestion present. No airspace consolidation. IMPRESSION: Pulmonary vascular congestion. Electronically Signed   By: Signa Kell M.D.   On: 03/26/2016 14:15    Medications:   . allopurinol  300 mg Oral QHS  . antiseptic oral rinse  7 mL Mouth Rinse BID  . atorvastatin  10 mg Oral Daily  . calcium acetate  2,001 mg Oral TID WC  . carvedilol  3.125 mg Oral BID WC  .  ceFAZolin (ANCEF) IV  2 g Intravenous Q T,Th,Sat-1800  . cinacalcet  60 mg Oral Q breakfast  . clonazePAM  1 mg Oral BID  . colchicine  0.3 mg Oral Once per day on Mon Thu  . doxercalciferol  7 mcg Intravenous Q T,Th,Sa-HD  . gabapentin  300 mg Oral QHS  . insulin aspart  0-15 Units Subcutaneous TID WC  . insulin glargine  35 Units Subcutaneous QHS  . lanthanum  1,000 mg Oral TID WC  . multivitamin  1 tablet Oral QHS  . pantoprazole  40 mg Oral Daily  . sodium chloride flush  3 mL Intravenous Q12H   Continuous Infusions:   Time spent: 35 minutes.  The patient is medically complex with multiple co-morbidities and is at high risk for clinical deterioration and requires high complexity decision making.   LOS: 2 days   Martina Brodbeck  [facility] Pager [phone]. If unable to reach me by pager, please call my cell phone at [phone].  *Please refer to [email or web address], password [email or web address] to get updated schedule on who will round on this patient, as hospitalists switch teams weekly. If 7PM-7AM, please contact night-coverage at [email or web address], password [email or web address] for  any overnight needs.  03/28/2016, 8:39 AM

## 2016-03-28 NOTE — Progress Notes (Addendum)
Patient ID: Shane Hamilton, male   DOB: 10/04/1966, 50 y.o.   MRN: [id number]   IR aware of request for HD catheter removal after dialysis today  Plan for replacement 2- 3 days later per agreement with Dr Fredia SorrowYamagata and Dr Eliott Nineunham

## 2016-03-28 NOTE — Procedures (Signed)
I have personally attended this patient's dialysis session.   UF goal 5 liters (challenging weight) TDC 4K bath  Shane Balynthia Kaleigh Spiegelman, MD [facility] [phone] Pager 03/28/2016, 8:41 AM

## 2016-03-28 NOTE — Procedures (Signed)
Interventional Radiology Procedure Note  Procedure:  Removal of tunneled HD catheter  Complications:  None  Estimated Blood Loss: None  Left IJ Palindrome catheter removed without difficulty. Bedside US demonstrates thrombus in left IJ vein with no significant flow. Visualized right IJ and EJ appear open. Will plan to try to get new tunneled catheter placed on right on Mon/Tue.  Jodi MarbleGlenn T. Fredia SorrowYamagata, M.D Pager:  [phone]

## 2016-03-29 ENCOUNTER — Inpatient Hospital Stay (HOSPITAL_COMMUNITY): Payer: Medicare Other

## 2016-03-29 DIAGNOSIS — I472 Ventricular tachycardia: Secondary | ICD-10-CM

## 2016-03-29 DIAGNOSIS — M79662 Pain in left lower leg: Secondary | ICD-10-CM

## 2016-03-29 DIAGNOSIS — I4729 Other ventricular tachycardia: Secondary | ICD-10-CM

## 2016-03-29 LAB — CULTURE, BLOOD (ROUTINE X 2)

## 2016-03-29 LAB — MAGNESIUM: Magnesium: 1.9 mg/dL (ref 1.7–2.4)

## 2016-03-29 LAB — GLUCOSE, CAPILLARY
GLUCOSE-CAPILLARY: 165 mg/dL — AB (ref 65–99)
GLUCOSE-CAPILLARY: 189 mg/dL — AB (ref 65–99)
Glucose-Capillary: 162 mg/dL — ABNORMAL HIGH (ref 65–99)
Glucose-Capillary: 267 mg/dL — ABNORMAL HIGH (ref 65–99)

## 2016-03-29 LAB — RENAL FUNCTION PANEL
Albumin: 2.8 g/dL — ABNORMAL LOW (ref 3.5–5.0)
Anion gap: 10 (ref 5–15)
BUN: 27 mg/dL — AB (ref 6–20)
CHLORIDE: 96 mmol/L — AB (ref 101–111)
CO2: 26 mmol/L (ref 22–32)
Calcium: 8.2 mg/dL — ABNORMAL LOW (ref 8.9–10.3)
Creatinine, Ser: 6.82 mg/dL — ABNORMAL HIGH (ref 0.61–1.24)
GFR calc Af Amer: 10 mL/min — ABNORMAL LOW (ref >60)
GFR, EST NON AFRICAN AMERICAN: 8 mL/min — AB (ref >60)
Glucose, Bld: 189 mg/dL — ABNORMAL HIGH (ref 65–99)
POTASSIUM: 3.7 mmol/L (ref 3.5–5.1)
Phosphorus: 4.3 mg/dL (ref 2.5–4.6)
Sodium: 132 mmol/L — ABNORMAL LOW (ref 135–145)

## 2016-03-29 LAB — CBC
HCT: 28.7 % — ABNORMAL LOW (ref 39.0–52.0)
HEMOGLOBIN: 8.7 g/dL — AB (ref 13.0–17.0)
MCH: 31 pg (ref 26.0–34.0)
MCHC: 30.3 g/dL (ref 30.0–36.0)
MCV: 102.1 fL — ABNORMAL HIGH (ref 78.0–100.0)
Platelets: 193 10*3/uL (ref 150–400)
RBC: 2.81 MIL/uL — ABNORMAL LOW (ref 4.22–5.81)
RDW: 16.6 % — ABNORMAL HIGH (ref 11.5–15.5)
WBC: 10.5 10*3/uL (ref 4.0–10.5)

## 2016-03-29 LAB — PROTIME-INR
INR: 1.44 (ref 0.00–1.49)
PROTHROMBIN TIME: 17.7 s — AB (ref 11.6–15.2)

## 2016-03-29 NOTE — Progress Notes (Signed)
Patient had 12 beats of V tach, heart rate equals 152 at 04:33.Patient with no complaints of chest pain or any discomfort. Patient non compliant with CPAP, nasal cannula replaced on patient. Patient in sinus rhythm currently. NP on call notified.

## 2016-03-29 NOTE — Progress Notes (Signed)
Progress Note    Shane MinaReginald D Hamilton  ZOX:[id number] DOB: 11/07/1965  DOA: 03/26/2016 PCP: Darrow BussingKOIRALA,DIBAS, MD    Brief Narrative:   Shane Hamilton is an 50 y.o. male with a Past Medical History of DM, obesity, anemia, HTN, gout, PT, hyperparathyroidism/secondary, GERD, seizures, ESRD on dialysis who Was admitted 03/26/16 with Sepsis. Blood cultures growing MSSA.  Assessment/Plan:   Principal Problem:   Sepsis (HCC) secondary to MSSA bacteremia Admitted and blood cultures obtained. Initially placed on broad-spectrum antibiotics with Zosyn and vancomycin. Antibiotics narrowed to Ancef secondary to MSSA + blood cultures. ID consulting. We'll need to address ongoing source and change out tunneled IJ HD catheter. Dialysis catheter removed, to be replaced by IR.  Active Problems:   Nonsustained V. Tach Patient noted to have a 12 beat run of ventricular tachycardia 03/29/16 at 5:30 AM. Magnesium OK.    Obstructive sleep apnea CPAP ordered.    DVT (deep venous thrombosis) (HCC)/chronic anticoagulation  Hold Coumadin in anticipation of surgery for removing tunneled IJ catheter.    Type 2 diabetes mellitus, uncontrolled (HCC) /diabetic neuropathy Currently being managed with moderate scale SSI 3 times a day/3 units of meal coverage and Lantus 35 units daily. CBGs 138-202. Continue Neurontin for neuropathy.    Gout Continue allopurinol.    End-stage renal disease on hemodialysis (HCC) HD per nephrology. Continue PhosLo, Hectorol and Sensipar.  Family Communication/Anticipated D/C date and plan/Code Status   DVT prophylaxis: On coumadin, on hold but INR therapeutic. Code Status: Full Code.  Family Communication: No family present today. Disposition Plan: Home when stable.   Medical Consultants:    Nephrology  Vascular Surgery   Procedures:    Anti-Infectives:   Zosyn 03/26/16---> 03/27/16 Vancomycin 03/26/16---> 03/27/16 Fortaz 03/26/16---> 03/27/16 Ancef  03/27/16--->   Subjective:   Shane Minaeginald D Shane Hamilton is sleepy today.  No specific complaints, feels well.  Objective:    Filed Vitals:   03/29/16 0054 03/29/16 0439 03/29/16 0500 03/29/16 0532  BP:  85/44  101/44  Pulse: 104 143  105  Temp:  97.9 F (36.6 C)    TempSrc:  Oral    Resp: 20 20    Height:      Weight:   140.6 kg (309 lb 15.5 oz)   SpO2: 94% 93%      Intake/Output Summary (Last 24 hours) at 03/29/16 0902 Last data filed at 03/29/16 0440  Gross per 24 hour  Intake    533 ml  Output   4168 ml  Net  -3635 ml   Filed Weights   03/28/16 1227 03/28/16 2043 03/29/16 0500  Weight: 182.9 kg (403 lb 3.5 oz) 140.615 kg (310 lb) 140.6 kg (309 lb 15.5 oz)    Exam: General exam: Appears calm and comfortable. Receiving HD. Respiratory system: Clear to auscultation. Respiratory effort normal. Cardiovascular system: S1 & S2 heard, RRR. No JVD,  rubs, gallops or clicks. No murmurs. Gastrointestinal system: Abdomen is nondistended, soft and nontender. No organomegaly or masses felt. Normal bowel sounds heard. Central nervous system: Alert and oriented. No focal neurological deficits. Extremities: Erythema and swelling to the lower extremities, left calf hot/erythematous. Skin: Scarring left upper arm from AV graft. Psychiatry: Judgement and insight appear normal. Mood & affect appropriate.   Data Reviewed:   I have personally reviewed following labs and imaging studies:  Labs: Basic Metabolic Panel:  Recent Labs Lab 03/26/16 1235 03/27/16 0315 03/28/16 0546 03/29/16 0444  NA 134* 135 131* 132*  K 5.3* 4.4  3.8 3.7  CL 96* 99* 93* 96*  CO2 22 29 26 26   GLUCOSE 257* 203* 243* 189*  BUN 65* 26* 27* 27*  CREATININE 13.22* 8.05* 6.88* 6.82*  CALCIUM 9.0 8.5* 8.2* 8.2*  PHOS  --   --  3.6 4.3   GFR Estimated Creatinine Clearance: 18.8 mL/min (by C-G formula based on Cr of 6.82). Liver Function Tests:  Recent Labs Lab 03/26/16 1235 03/28/16 0546 03/29/16 0444   AST 27  --   --   ALT 24  --   --   ALKPHOS 138*  --   --   BILITOT 0.9  --   --   PROT 8.4*  --   --   ALBUMIN 3.7 2.9* 2.8*   Coagulation profile  Recent Labs Lab 03/27/16 0032 03/29/16 0444  INR 3.08* 1.44    CBC:  Recent Labs Lab 03/26/16 1235 03/27/16 0315 03/29/16 0444  WBC 17.2* 13.5* 10.5  NEUTROABS 14.0*  --   --   HGB 10.5* 9.5* 8.7*  HCT 34.3* 32.0* 28.7*  MCV 102.1* 103.9* 102.1*  PLT 226 205 193   CBG:  Recent Labs Lab 03/28/16 0754 03/28/16 1330 03/28/16 1631 03/28/16 2046 03/29/16 0727  GLUCAP 202* 138* 151* 172* 165*   Sepsis Labs:  Recent Labs Lab 03/26/16 1235 03/26/16 1246 03/26/16 1509 03/27/16 0032 03/27/16 0315 03/29/16 0444  PROCALCITON  --   --   --  6.01  --   --   WBC 17.2*  --   --   --  13.5* 10.5  LATICACIDVEN  --  2.34* 1.65 1.2 0.8  --    Urine analysis:    Component Value Date/Time   COLORURINE YELLOW 07/07/2010 1716   APPEARANCEUR CLEAR 07/07/2010 1716   LABSPEC 1.014 07/07/2010 1716   PHURINE 7.0 07/07/2010 1716   GLUCOSEU NEGATIVE 07/07/2010 1716   HGBUR SMALL* 07/07/2010 1716   BILIRUBINUR NEGATIVE 07/07/2010 1716   KETONESUR NEGATIVE 07/07/2010 1716   PROTEINUR 100* 07/07/2010 1716   UROBILINOGEN 0.2 07/07/2010 1716   NITRITE NEGATIVE 07/07/2010 1716   LEUKOCYTESUR NEGATIVE 07/07/2010 1716   Microbiology Recent Results (from the past 240 hour(s))  Blood Culture (routine x 2)     Status: Abnormal   Collection Time: 03/26/16 12:37 PM  Result Value Ref Range Status   Specimen Description BLOOD LEFT ANTECUBITAL  Final   Special Requests   Final    BOTTLES DRAWN AEROBIC AND ANAEROBIC 10CC AER 5CC ANA   Culture  Setup Time   Final    GRAM POSITIVE COCCI IN CLUSTERS AEROBIC BOTTLE ONLY CRITICAL RESULT CALLED TO, READ BACK BY AND VERIFIED WITH: M BELL,PHARMD AT 1610 03/28/16 BY L BENFIELD     Culture (A)  Final    STAPHYLOCOCCUS AUREUS SUSCEPTIBILITIES PERFORMED ON PREVIOUS CULTURE WITHIN THE LAST 5  DAYS.    Report Status 03/29/2016 FINAL  Final  Blood Culture (routine x 2)     Status: Abnormal   Collection Time: 03/26/16  1:07 PM  Result Value Ref Range Status   Specimen Description BLOOD RIGHT ARM  Final   Special Requests BOTTLES DRAWN AEROBIC AND ANAEROBIC 5CC  Final   Culture  Setup Time   Final    GRAM POSITIVE COCCI IN CLUSTERS IN BOTH AEROBIC AND ANAEROBIC BOTTLES CRITICAL RESULT CALLED TO, READ BACK BY AND VERIFIED WITH: CRoseanne Reno, PHARM D AT 0840 ON [id number] BY Lucienne Capers    Culture STAPHYLOCOCCUS AUREUS (A)  Final   Report Status  03/29/2016 FINAL  Final   Organism ID, Bacteria STAPHYLOCOCCUS AUREUS  Final      Susceptibility   Staphylococcus aureus - MIC*    CIPROFLOXACIN <=0.5 SENSITIVE Sensitive     ERYTHROMYCIN <=0.25 SENSITIVE Sensitive     GENTAMICIN <=0.5 SENSITIVE Sensitive     OXACILLIN 0.5 SENSITIVE Sensitive     TETRACYCLINE 8 INTERMEDIATE Intermediate     VANCOMYCIN <=0.5 SENSITIVE Sensitive     TRIMETH/SULFA <=10 SENSITIVE Sensitive     CLINDAMYCIN <=0.25 SENSITIVE Sensitive     RIFAMPIN <=0.5 SENSITIVE Sensitive     Inducible Clindamycin NEGATIVE Sensitive     * STAPHYLOCOCCUS AUREUS  Blood Culture ID Panel (Reflexed)     Status: Abnormal   Collection Time: 03/26/16  1:07 PM  Result Value Ref Range Status   Enterococcus species NOT DETECTED NOT DETECTED Final   Vancomycin resistance NOT DETECTED NOT DETECTED Final   Listeria monocytogenes NOT DETECTED NOT DETECTED Final   Staphylococcus species DETECTED (A) NOT DETECTED Final    Comment: CRITICAL RESULT CALLED TO, READ BACK BY AND VERIFIED WITH: C. STEWART, PHARM D AT 0840 ON [id number] BY S. YARBROUGH    Staphylococcus aureus DETECTED (A) NOT DETECTED Final    Comment: CRITICAL RESULT CALLED TO, READ BACK BY AND VERIFIED WITH: C. STEWART, PHARM D AT 0840 ON [id number] BY S. YARBROUGH    Methicillin resistance NOT DETECTED NOT DETECTED Final   Streptococcus species NOT DETECTED NOT DETECTED Final    Streptococcus agalactiae NOT DETECTED NOT DETECTED Final   Streptococcus pneumoniae NOT DETECTED NOT DETECTED Final   Streptococcus pyogenes NOT DETECTED NOT DETECTED Final   Acinetobacter baumannii NOT DETECTED NOT DETECTED Final   Enterobacteriaceae species NOT DETECTED NOT DETECTED Final   Enterobacter cloacae complex NOT DETECTED NOT DETECTED Final   Escherichia coli NOT DETECTED NOT DETECTED Final   Klebsiella oxytoca NOT DETECTED NOT DETECTED Final   Klebsiella pneumoniae NOT DETECTED NOT DETECTED Final   Proteus species NOT DETECTED NOT DETECTED Final   Serratia marcescens NOT DETECTED NOT DETECTED Final   Carbapenem resistance NOT DETECTED NOT DETECTED Final   Haemophilus influenzae NOT DETECTED NOT DETECTED Final   Neisseria meningitidis NOT DETECTED NOT DETECTED Final   Pseudomonas aeruginosa NOT DETECTED NOT DETECTED Final   Candida albicans NOT DETECTED NOT DETECTED Final   Candida glabrata NOT DETECTED NOT DETECTED Final   Candida krusei NOT DETECTED NOT DETECTED Final   Candida parapsilosis NOT DETECTED NOT DETECTED Final   Candida tropicalis NOT DETECTED NOT DETECTED Final    Radiology: Ir Removal Tun Cv Cath W/o Fl  03/28/2016  CLINICAL DATA:  MRSA bacteremia and need to remove tunneled left-sided hemodialysis catheter. EXAM: REMOVAL OF TUNNELED CENTRAL VENOUS CATHETER PROCEDURE: The left chest dialysis catheter site was prepped with chlorhexidine. A sterile gown and gloves were worn during the procedure. Local anesthesia was provided with 1% lidocaine. Utilizing blunt dissection, the subcutaneous cuff of the dialysis catheter was freed. The catheter was then successfully removed in its entirety. A sterile dressing was applied over the catheter exit site. IMPRESSION: Removal of tunneled dialysis catheter utilizing blunt dissection. The procedure was uncomplicated. Electronically Signed   By: Irish LackGlenn  Yamagata M.D.   On: 03/28/2016 15:42    Medications:   . allopurinol  300  mg Oral QHS  . antiseptic oral rinse  7 mL Mouth Rinse BID  . atorvastatin  10 mg Oral Daily  . calcium acetate  2,001 mg Oral TID WC  . carvedilol  3.125 mg Oral BID WC  . cinacalcet  60 mg Oral Q breakfast  . clonazePAM  1 mg Oral BID  . colchicine  0.3 mg Oral Once per day on Mon Thu  . darbepoetin (ARANESP) injection - DIALYSIS  100 mcg Intravenous Q Sat-HD  . doxercalciferol  7 mcg Intravenous Q T,Th,Sa-HD  . gabapentin  300 mg Oral QHS  . insulin aspart  0-15 Units Subcutaneous TID WC  . insulin aspart  0-5 Units Subcutaneous QHS  . insulin aspart  3 Units Subcutaneous TID WC  . insulin glargine  35 Units Subcutaneous QHS  . lanthanum  1,000 mg Oral TID WC  . multivitamin  1 tablet Oral QHS  . pantoprazole  40 mg Oral Daily  . sodium chloride flush  3 mL Intravenous Q12H   Continuous Infusions:   Time spent: 25 minutes.   LOS: 3 days   Cato Liburd  [facility] Pager [phone]. If unable to reach me by pager, please call my cell phone at [phone].  *Please refer to [email or web address], password [email or web address] to get updated schedule on who will round on this patient, as hospitalists switch teams weekly. If 7PM-7AM, please contact night-coverage at [email or web address], password [email or web address] for any overnight needs.  03/29/2016, 9:02 AM

## 2016-03-29 NOTE — Progress Notes (Signed)
RT spoke to patient about wearing the CPAP and the patient isn't complying at this time. RT advised patient that if he changes his mind that he can have respiratory notified.  RT will continue to monitor. RN present for conversation.
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
## 2016-03-29 NOTE — Progress Notes (Signed)
VASCULAR LAB PRELIMINARY  PRELIMINARY  PRELIMINARY  PRELIMINARY  Left lower extremity venous duplex has been completed.     Left:  No evidence of DVT, superficial thrombosis, or Baker's cyst.  Exam difficult due to body habitus and depth of vessel.  Roslyn Else, RVT, RDMS 03/29/2016, 9:11 AM

## 2016-03-30 ENCOUNTER — Encounter (HOSPITAL_COMMUNITY): Payer: Self-pay | Admitting: Radiology

## 2016-03-30 ENCOUNTER — Inpatient Hospital Stay (HOSPITAL_COMMUNITY): Payer: Medicare Other

## 2016-03-30 DIAGNOSIS — N186 End stage renal disease: Secondary | ICD-10-CM

## 2016-03-30 LAB — RENAL FUNCTION PANEL
ALBUMIN: 2.7 g/dL — AB (ref 3.5–5.0)
Anion gap: 15 (ref 5–15)
BUN: 50 mg/dL — ABNORMAL HIGH (ref 6–20)
CHLORIDE: 94 mmol/L — AB (ref 101–111)
CO2: 24 mmol/L (ref 22–32)
CREATININE: 9.94 mg/dL — AB (ref 0.61–1.24)
Calcium: 8.1 mg/dL — ABNORMAL LOW (ref 8.9–10.3)
GFR, EST AFRICAN AMERICAN: 6 mL/min — AB (ref >60)
GFR, EST NON AFRICAN AMERICAN: 5 mL/min — AB (ref >60)
Glucose, Bld: 209 mg/dL — ABNORMAL HIGH (ref 65–99)
PHOSPHORUS: 4.7 mg/dL — AB (ref 2.5–4.6)
POTASSIUM: 3.7 mmol/L (ref 3.5–5.1)
Sodium: 133 mmol/L — ABNORMAL LOW (ref 135–145)

## 2016-03-30 LAB — GLUCOSE, CAPILLARY
GLUCOSE-CAPILLARY: 212 mg/dL — AB (ref 65–99)
Glucose-Capillary: 180 mg/dL — ABNORMAL HIGH (ref 65–99)
Glucose-Capillary: 133 mg/dL — ABNORMAL HIGH (ref 65–99)

## 2016-03-30 MED ORDER — IOPAMIDOL (ISOVUE-300) INJECTION 61%
INTRAVENOUS | Status: AC
  Administered 2016-03-30: 17:00:00
  Filled 2016-03-30: qty 100

## 2016-03-30 MED ORDER — IOPAMIDOL (ISOVUE-300) INJECTION 61%
INTRAVENOUS | Status: AC
  Filled 2016-03-30: qty 100

## 2016-03-30 MED ORDER — INSULIN ASPART 100 UNIT/ML ~~LOC~~ SOLN
4.0000 [IU] | Freq: Three times a day (TID) | SUBCUTANEOUS | Status: DC
  Administered 2016-03-30: 4 [IU] via SUBCUTANEOUS

## 2016-03-30 NOTE — Care Management Note (Signed)
Case Management Note  Patient Details  Name: Shane Hamilton MRN: [id number] Date of Birth: August 08, 1966  Subjective/Objective:    CM following for progression and d/c planning.                 Action/Plan: 03/30/2016 Met with pt re CPAP, Wheelchair and tub seat. Pt states that he had a wheelchair about 10 yr ago, if this is incorrect and it has been less than 5 years we will be unable to obtain another wheelchair. [facility] to attempt to get wheelchair and tub seat.  Per [facility] this pt stated that he could not come to [facility] to pick up the CPAP and receive instructions. This CM will ask [facility] to bring the CPAP to the hospital as the pt continues to state that his wife works and is unable to pick up the CPAP.   Expected Discharge Date:    03/31/2016              Expected Discharge Plan:  Home/Self Care  In-House Referral:  NA  Discharge planning Services  CM Consult  Post Acute Care Choice:  Durable Medical Equipment Choice offered to:  Patient  DME Arranged:  Wheelchair manual, Shower stool DME Agency:  [place]:    [facility] Agency:     Status of Service:  In process, will continue to follow  If discussed at Long Length of Stay Meetings, dates discussed:    Additional Comments:  Adron Bene, RN 03/30/2016, 1:19 PM

## 2016-03-30 NOTE — Progress Notes (Signed)
    [facility]    Date of Admission:  03/26/2016   Total days of antibiotics 5           ID: Shane MinaReginald D Hamilton is a 50 y.o. male with ESRD on HD with difficult vascular access with MSSA bacteremia thought to be due to HD line that has been pulled Principal Problem:   Sepsis (HCC) Active Problems:   Obstructive sleep apnea   DVT (deep venous thrombosis) (HCC)   Chronic anticoagulation   Type 2 diabetes mellitus, uncontrolled (HCC)   Gout   Fever   End-stage renal disease on hemodialysis (HCC)   Bacteremia   NSVT (nonsustained ventricular tachycardia) (HCC)    24hr: difficult getting IR to replace line, now being seen by vascular who may do general anesthesia to place tunneled catheter  Assessment/Plan: MSSA bacteremia = will repeat blood cx today. To document clearance. Continue with cefazolin with HD sessions.   Shane Hamilton, [facility]Shane Hamilton [facility] Cell: [phone] Pager: [phone]  03/30/2016, 8:22 PM
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
## 2016-03-30 NOTE — Consult Note (Signed)
Patient name: Shane MinaReginald D Hamilton MRN: [id number] DOB: 09/22/1966 Sex: male   Referred by: Arlean HoppingSchertz  Reason for referral:  Chief Complaint  Patient presents with  . Fever    HISTORY OF PRESENT ILLNESS: Asked to see for catheter placement. Very complex 50 year old gentleman weighing excess of 400 pounds. He has had very difficult access history with no prior access working well for him. He did have attempts at a left arm fistula with multiple stenting and eventual infection of the stents. He has been relying on tunneled catheters. He underwent right arm venogram Dr. Darrick Pennafields in December 2016 showing innominate vein occlusion. He was felt to have a section of his left IJ catheter which had been placed by interventional radiology. This was discontinued and removed by interventional radiology yesterday. During this procedure ultrasound of his internal jugular vein on the left revealed occlusion. He had been scheduled for interventional radiology place a new catheter today but they were unable to do this since it is impossible for the patient alive flat without general anesthesia. I have discussed this with the patient. This is my first encounter with this patient. He has a great deal difficulty breathing even sitting in a chair. Is coughing and very short of breath.  Past Medical History  Diagnosis Date  . Type 2 diabetes mellitus, uncontrolled (HCC)        . Morbid obesity (HCC)   . OSA (obstructive sleep apnea)   . Septic shock(785.52)   . Anemia   . Hypertension   . Gout   . LOC (loss of consciousness)   . DVT (deep venous thrombosis) (HCC) 2011  . Cardiomegaly   . Chronic anticoagulation   . Chronic pancreatitis (HCC)   . Secondary hyperparathyroidism (HCC)   . Pneumonia   . GERD (gastroesophageal reflux disease)   . Seizures (HCC)     pt denies  . Headache     migraines  . Shortness of breath dyspnea     with exertion  . ESRD (end stage renal disease) (HCC)     [place]S -  [place]  . Kidney stones   . Abscess of right arm 06/17/2015  . Arteriovenous fistula infection [facility](HCC)     Past Surgical History  Procedure Laterality Date  . Av fistula placement    . Amputation finger / thumb Right   . I&d extremity Left 06/20/2015    Procedure: IRRIGATION AND DEBRIDEMENT EXTREMITY;  Surgeon: Renford DillsGregory G Schnier, MD;  Location: [facility] ORS;  Service: Vascular;  Laterality: Left;  . Application of wound vac Left 06/20/2015    Procedure: APPLICATION OF WOUND VAC;  Surgeon: Renford DillsGregory G Schnier, MD;  Location: [facility] ORS;  Service: Vascular;  Laterality: Left;  . Av fistula placement Left 06/20/2015    Procedure: ARTERIOVENOUS (AV) FISTULA  ligation , excision;  Surgeon: Renford DillsGregory G Schnier, MD;  Location: [facility] ORS;  Service: Vascular;  Laterality: Left;  . Peripheral vascular catheterization N/A 06/25/2015    Procedure: Dialysis/Perma Catheter Insertion;  Surgeon: Annice NeedyJason S Dew, MD;  Location: [facility] INVASIVE CV LAB;  Service: Cardiovascular;  Laterality: N/A;  . Insertion of dialysis catheter N/A 06/25/2015    Procedure: place a tunnelled catheter for long term use;  Surgeon: Annice NeedyJason S Dew, MD;  Location: [facility] ORS;  Service: Vascular;  Laterality: N/A;  . Insertion of dialysis catheter Left 07/25/2015    Procedure: INSERTION OF LEFT INTERNAL JUGULAR DIALYSIS CATHETER;  Surgeon: Pryor OchoaJames D Lawson, MD;  Location: [facility] OR;  Service: Vascular;  Laterality: Left;  . Insertion of dialysis catheter Left 08/01/2015    Procedure: REMOVAL OF DIALYSIS CATHETER; PLACEMNET OF NEW DIALYSIS CATHETER;  Surgeon: Pryor OchoaJames D Lawson, MD;  [facility];  Service: Vascular;  Laterality: Left;  . Peripheral vascular catheterization Bilateral 09/13/2015    Procedure: Upper Extremity Venography;  Surgeon: Sherren Kernsharles E Fields, MD;  [facility];  Service: Cardiovascular;  Laterality: Bilateral;  . Exchange of a dialysis catheter Left 11/13/2015    Procedure: EXCHANGE OF A DIALYSIS CATHETER- LEFT INTERNAL  JUGULAR;  Surgeon: Nada LibmanVance W Brabham, MD;  Location: [facility] OR;  Service: Vascular;  Laterality: Left;    Social History   Social History  . Marital Status: Married    Spouse Name: N/A  . Number of Children: N/A  . Years of Education: N/A   Occupational History  . disabled     Social History Main Topics  . Smoking status: Never Smoker   . Smokeless tobacco: Never Used  . Alcohol Use: No  . Drug Use: No  . Sexual Activity: Not on file   Other Topics Concern  . Not on file   Social History Narrative    Family History  Problem Relation Age of Onset  . Diabetes Mother   . Hypertension Mother   . Diabetes Father   . Hypertension Father   . Hypertension Brother     Allergies as of 03/26/2016 - Review Complete 03/26/2016  Allergen Reaction Noted  . Aspirin Other (See Comments) 08/05/2011    Current Facility-Administered Medications on File Prior to Encounter  Medication Dose Route Frequency Provider Last Rate Last Dose  . cefUROXime (ZINACEF) 1.5 g in dextrose 5 % 50 mL IVPB  1.5 g Intravenous 30 min Pre-Op Fransisco HertzBrian L Chen, MD       Current Outpatient Prescriptions on File Prior to Encounter  Medication Sig Dispense Refill  . allopurinol (ZYLOPRIM) 300 MG tablet Take 300 mg by mouth 2 (two) times daily.    Marland Kitchen. atorvastatin (LIPITOR) 10 MG tablet Take 10 mg by mouth daily.    . calcium acetate (PHOSLO) 667 MG capsule Take 2,001 mg by mouth 3 (three) times daily with meals.     . carvedilol (COREG) 3.125 MG tablet Take 1 tablet (3.125 mg total) by mouth 2 (two) times daily with a meal. 60 tablet 0  . cinacalcet (SENSIPAR) 60 MG tablet Take 60 mg by mouth daily.    . clonazePAM (KLONOPIN) 1 MG tablet Take 1 mg by mouth 2 (two) times daily.    . colchicine 0.6 MG tablet Take 0.6 mg by mouth 3 (three) times daily.     Marland Kitchen. gabapentin (NEURONTIN) 300 MG capsule Take 600 mg by mouth every 8 (eight) hours.     . insulin glargine (LANTUS) 100 UNIT/ML injection Inject 0.35 mLs (35 Units  total) into the skin at bedtime. (Patient taking differently: Inject 35 Units into the skin at bedtime. ) 10 mL 11  . lanthanum (FOSRENOL) 1000 MG chewable tablet Chew 1,000 mg by mouth 3 (three) times daily with meals.     . multivitamin (RENA-VIT) TABS tablet Take 1 tablet by mouth daily.   3  . omeprazole (PRILOSEC) 20 MG capsule Take 20 mg by mouth 2 (two) times daily before a meal.     . Oxycodone HCl 10 MG TABS Take 1 tablet (10 mg total) by mouth 2 (two) times daily as needed (pain). 20 tablet 0  . BD PEN NEEDLE  NANO U/F 32G X 4 MM MISC        REVIEW OF SYSTEMS:  Negative except for above  PHYSICAL EXAMINATION:  General: The patient is a morbidly obese male, in no acute distress. Vital signs are BP 103/63 mmHg  Pulse 104  Temp(Src) 98.5 F (36.9 C) (Oral)  Resp 18  Ht  (1.803 m)  Wt 404 lb 4.8 oz (183.389 kg)  BMI 56.41 kg/m2  SpO2 81% Pulmonary: Markedly increased work of breathing Abdomen: Soft and non-tender  Musculoskeletal: There are no major deformities.  Neurologic: No focal weakness or paresthesias are detected, Skin: There are no ulcer or rashes noted. Psychiatric: The patient has normal affect. Cardiovascular: Palpable radial pulses    Impression and Plan:  Discussed with Dr. Arlean Hopping. 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 Could potentially be a candidate for left subclavian catheter placement. This would not likelihood result in occlusion of his left subclavian vein over time but would be difficult for him to have left arm access. The only other option would be femoral catheter. This obviously would carry high likelihood of infection due to his morbid obesity. We are available for a catheter placement tomorrow. Obviously would be at extremely high risk for prolonged ventilatory support if he does have a general anesthesia for the procedure but this may be the only option since he cannot  lie flat. Will keep him nothing by mouth tonight for potential catheter placement tomorrow    Early, [place] [facility] Office: [phone]

## 2016-03-30 NOTE — Care Management Important Message (Signed)
Important Message  Patient Details  Name: Shane Hamilton MRN: [id number] Date of Birth: 12/04/1965   Medicare Important Message Given:  Yes    Bernadette HoitShoffner, [name] 03/30/2016, 9:01 AM

## 2016-03-30 NOTE — Progress Notes (Signed)
[facility] upon arrival to the CT department.  I tried to flush the Shane Hamilton with saline and the Shane Hamilton did not work.  I removed the Shane Hamilton I tried to start another Shane Hamilton along with my other 2 co-workers we were all unsuccessful.  We paged the Shane Hamilton team and while waiting for them to come and started a new Shane Hamilton we Paged Dr. Darnelle Catalanama.  We explained the situation to Dr. Darnelle Catalanama and she did not want the patient to have the study until he had vascular access. Shane Hamilton team was not able to get an Shane Hamilton either so we sent the patient up stairs to his room.

## 2016-03-30 NOTE — Progress Notes (Addendum)
Progress Note    Shane Hamilton  WGN:[id number] DOB: [birth year]  DOA: 03/26/2016 PCP: Darrow Bussing, MD    Brief Narrative:   Shane Hamilton is an 50 y.o. male with a Past Medical History of DM, obesity, anemia, HTN, gout, PT, hyperparathyroidism/secondary, GERD, seizures, ESRD on dialysis who Was admitted 03/26/16 with Sepsis. Blood cultures growing MSSA.  Assessment/Plan:   Principal Problem:   Sepsis (HCC) secondary to MSSA bacteremia Admitted and blood cultures obtained. Initially placed on broad-spectrum antibiotics with Zosyn and vancomycin. Antibiotics narrowed to Ancef secondary to MSSA + blood cultures. ID consulting. We'll need to address ongoing source and change out tunneled IJ HD catheter. Dialysis catheter removed, to be replaced by IR.  Active Problems:   Left leg cellulitis Will get CT r/o deep tissue infection. Continue Ancef. No evidence of DVT status post Dopplers.    Nonsustained V. Tach Patient noted to have a 12 beat run of ventricular tachycardia 03/29/16 at 5:30 AM. Magnesium OK.    Obstructive sleep apnea CPAP ordered.    DVT (deep venous thrombosis) (HCC)/chronic anticoagulation  Hold Coumadin in anticipation of surgery for removing tunneled IJ catheter.    Type 2 diabetes mellitus, uncontrolled (HCC) /diabetic neuropathy Currently being managed with moderate scale SSI 3 times a day/3 units of meal coverage and Lantus 35 units daily. CBGs 162-267. Increase meal coverage to 4 units. Continue Neurontin for neuropathy.    Gout Continue allopurinol.    End-stage renal disease on hemodialysis (HCC) HD per nephrology. Continue PhosLo, Hectorol and Sensipar.  Family Communication/Anticipated D/C date and plan/Code Status   DVT prophylaxis: On coumadin, on hold but INR therapeutic. Code Status: Full Code.  Family Communication: Several family updated at the bedside. Disposition Plan: Home when stable.   Medical Consultants:     Nephrology  Vascular Surgery   Procedures:    Anti-Infectives:   Zosyn 03/26/16---> 03/27/16 Vancomycin 03/26/16---> 03/27/16 Fortaz 03/26/16---> 03/27/16 Ancef 03/27/16--->   Subjective:   Shane Hamilton tells me he has had some nausea and diminished appetite over the past 24 hours. Left calf is sore. No chest pain or dyspnea.  Objective:    Filed Vitals:   03/29/16 1643 03/29/16 2051 03/30/16 0051 03/30/16 0503  BP: 90/56 99/45  103/63  Pulse: 100 98 105 104  Temp: 98.2 F (36.8 C) 98.4 F (36.9 C)  98.5 F (36.9 C)  TempSrc: Oral Oral  Oral  Resp: Height:      Weight:  183.389 kg (404 lb 4.8 oz)    SpO2: 90% 85% 90% 81%    Intake/Output Summary (Last 24 hours) at 03/30/16 1006 Last data filed at 03/30/16 0828  Gross per 24 hour  Intake    840 ml  Output      0 ml  Net    840 ml   Filed Weights   03/29/16 0500 03/29/16 1139 03/29/16 2051  Weight: 140.6 kg (309 lb 15.5 oz) 182.8 kg (403 lb) 183.389 kg (404 lb 4.8 oz)    Exam: General exam: Appears calm and comfortable. Sitting up in chair. Respiratory system: Clear to auscultation. Respiratory effort normal. Cardiovascular system: S1 & S2 heard, RRR. No JVD,  rubs, gallops or clicks. No murmurs. Gastrointestinal system: Abdomen is nondistended, soft and nontender. No organomegaly or masses felt. Normal bowel sounds heard. Central nervous system: Alert and oriented. No focal neurological deficits. Extremities: Erythema and swelling to the lower extremities, left calf hot/erythematous. Skin: Scarring  left upper arm from AV graft. Psychiatry: Judgement and insight appear normal. Mood & affect appropriate.   Data Reviewed:   I have personally reviewed following labs and imaging studies:  Labs: Basic Metabolic Panel:  Recent Labs Lab 03/26/16 1235 03/27/16 0315 03/28/16 0546 03/29/16 0444 03/29/16 0919  NA 134* 135 131* 132*  --   K 5.3* 4.4 3.8 3.7  --   CL 96* 99* 93* 96*  --    CO2 22 29 26 26   --   GLUCOSE 257* 203* 243* 189*  --   BUN 65* 26* 27* 27*  --   CREATININE 13.22* 8.05* 6.88* 6.82*  --   CALCIUM 9.0 8.5* 8.2* 8.2*  --   MG  --   --   --   --  1.9  PHOS  --   --  3.6 4.3  --    GFR Estimated Creatinine Clearance: 22 mL/min (by C-G formula based on Cr of 6.82). Liver Function Tests:  Recent Labs Lab 03/26/16 1235 03/28/16 0546 03/29/16 0444  AST 27  --   --   ALT 24  --   --   ALKPHOS 138*  --   --   BILITOT 0.9  --   --   PROT 8.4*  --   --   ALBUMIN 3.7 2.9* 2.8*   Coagulation profile  Recent Labs Lab 03/27/16 0032 03/29/16 0444  INR 3.08* 1.44    CBC:  Recent Labs Lab 03/26/16 1235 03/27/16 0315 03/29/16 0444  WBC 17.2* 13.5* 10.5  NEUTROABS 14.0*  --   --   HGB 10.5* 9.5* 8.7*  HCT 34.3* 32.0* 28.7*  MCV 102.1* 103.9* 102.1*  PLT 226 205 193   CBG:  Recent Labs Lab 03/29/16 0727 03/29/16 1117 03/29/16 1635 03/29/16 2038 03/30/16 0803  GLUCAP 165* 162* 189* 267* 212*   Sepsis Labs:  Recent Labs Lab 03/26/16 1235 03/26/16 1246 03/26/16 1509 03/27/16 0032 03/27/16 0315 03/29/16 0444  PROCALCITON  --   --   --  6.01  --   --   WBC 17.2*  --   --   --  13.5* 10.5  LATICACIDVEN  --  2.34* 1.65 1.2 0.8  --    Microbiology Recent Results (from the past 240 hour(s))  Blood Culture (routine x 2)     Status: Abnormal   Collection Time: 03/26/16 12:37 PM  Result Value Ref Range Status   Specimen Description BLOOD LEFT ANTECUBITAL  Final   Special Requests   Final    BOTTLES DRAWN AEROBIC AND ANAEROBIC 10CC AER 5CC ANA   Culture  Setup Time   Final    GRAM POSITIVE COCCI IN CLUSTERS AEROBIC BOTTLE ONLY CRITICAL RESULT CALLED TO, READ BACK BY AND VERIFIED WITH: M BELL,PHARMD AT 0622 03/28/16 BY L BENFIELD     Culture (A)  Final    STAPHYLOCOCCUS AUREUS SUSCEPTIBILITIES PERFORMED ON PREVIOUS CULTURE WITHIN THE LAST 5 DAYS.    Report Status 03/29/2016 FINAL  Final  Blood Culture (routine x 2)      Status: Abnormal   Collection Time: 03/26/16  1:07 PM  Result Value Ref Range Status   Specimen Description BLOOD RIGHT ARM  Final   Special Requests BOTTLES DRAWN AEROBIC AND ANAEROBIC 5CC  Final   Culture  Setup Time   Final    GRAM POSITIVE COCCI IN CLUSTERS IN BOTH AEROBIC AND ANAEROBIC BOTTLES CRITICAL RESULT CALLED TO, READ BACK BY AND VERIFIED WITH: Mervin Kung. STEWART, PHARM D  AT 0840 ON [id number] BY Lucienne CapersS. YARBROUGH    Culture STAPHYLOCOCCUS AUREUS (A)  Final   Report Status 03/29/2016 FINAL  Final   Organism ID, Bacteria STAPHYLOCOCCUS AUREUS  Final      Susceptibility   Staphylococcus aureus - MIC*    CIPROFLOXACIN <=0.5 SENSITIVE Sensitive     ERYTHROMYCIN <=0.25 SENSITIVE Sensitive     GENTAMICIN <=0.5 SENSITIVE Sensitive     OXACILLIN 0.5 SENSITIVE Sensitive     TETRACYCLINE 8 INTERMEDIATE Intermediate     VANCOMYCIN <=0.5 SENSITIVE Sensitive     TRIMETH/SULFA <=10 SENSITIVE Sensitive     CLINDAMYCIN <=0.25 SENSITIVE Sensitive     RIFAMPIN <=0.5 SENSITIVE Sensitive     Inducible Clindamycin NEGATIVE Sensitive     * STAPHYLOCOCCUS AUREUS  Blood Culture ID Panel (Reflexed)     Status: Abnormal   Collection Time: 03/26/16  1:07 PM  Result Value Ref Range Status   Enterococcus species NOT DETECTED NOT DETECTED Final   Vancomycin resistance NOT DETECTED NOT DETECTED Final   Listeria monocytogenes NOT DETECTED NOT DETECTED Final   Staphylococcus species DETECTED (A) NOT DETECTED Final    Comment: CRITICAL RESULT CALLED TO, READ BACK BY AND VERIFIED WITH: C. STEWART, PHARM D AT 0840 ON [id number] BY S. YARBROUGH    Staphylococcus aureus DETECTED (A) NOT DETECTED Final    Comment: CRITICAL RESULT CALLED TO, READ BACK BY AND VERIFIED WITH: C. STEWART, PHARM D AT 0840 ON [id number] BY S. YARBROUGH    Methicillin resistance NOT DETECTED NOT DETECTED Final   Streptococcus species NOT DETECTED NOT DETECTED Final   Streptococcus agalactiae NOT DETECTED NOT DETECTED Final   Streptococcus pneumoniae  NOT DETECTED NOT DETECTED Final   Streptococcus pyogenes NOT DETECTED NOT DETECTED Final   Acinetobacter baumannii NOT DETECTED NOT DETECTED Final   Enterobacteriaceae species NOT DETECTED NOT DETECTED Final   Enterobacter cloacae complex NOT DETECTED NOT DETECTED Final   Escherichia coli NOT DETECTED NOT DETECTED Final   Klebsiella oxytoca NOT DETECTED NOT DETECTED Final   Klebsiella pneumoniae NOT DETECTED NOT DETECTED Final   Proteus species NOT DETECTED NOT DETECTED Final   Serratia marcescens NOT DETECTED NOT DETECTED Final   Carbapenem resistance NOT DETECTED NOT DETECTED Final   Haemophilus influenzae NOT DETECTED NOT DETECTED Final   Neisseria meningitidis NOT DETECTED NOT DETECTED Final   Pseudomonas aeruginosa NOT DETECTED NOT DETECTED Final   Candida albicans NOT DETECTED NOT DETECTED Final   Candida glabrata NOT DETECTED NOT DETECTED Final   Candida krusei NOT DETECTED NOT DETECTED Final   Candida parapsilosis NOT DETECTED NOT DETECTED Final   Candida tropicalis NOT DETECTED NOT DETECTED Final    Radiology: Ir Removal Tun Cv Cath W/o Fl  03/28/2016  CLINICAL DATA:  MRSA bacteremia and need to remove tunneled left-sided hemodialysis catheter. EXAM: REMOVAL OF TUNNELED CENTRAL VENOUS CATHETER PROCEDURE: The left chest dialysis catheter site was prepped with chlorhexidine. A sterile gown and gloves were worn during the procedure. Local anesthesia was provided with 1% lidocaine. Utilizing blunt dissection, the subcutaneous cuff of the dialysis catheter was freed. The catheter was then successfully removed in its entirety. A sterile dressing was applied over the catheter exit site. IMPRESSION: Removal of tunneled dialysis catheter utilizing blunt dissection. The procedure was uncomplicated. Electronically Signed   By: Irish LackGlenn  Yamagata M.D.   On: 03/28/2016 15:42    Medications:   . allopurinol  300 mg Oral QHS  . antiseptic oral rinse  7 mL Mouth Rinse BID  .  atorvastatin  10 mg  Oral Daily  . calcium acetate  2,001 mg Oral TID WC  . carvedilol  3.125 mg Oral BID WC  . cinacalcet  60 mg Oral Q breakfast  . clonazePAM  1 mg Oral BID  . colchicine  0.3 mg Oral Once per day on Mon Thu  . darbepoetin (ARANESP) injection - DIALYSIS  100 mcg Intravenous Q Sat-HD  . doxercalciferol  7 mcg Intravenous Q T,Th,Sa-HD  . gabapentin  300 mg Oral QHS  . insulin aspart  0-15 Units Subcutaneous TID WC  . insulin aspart  0-5 Units Subcutaneous QHS  . insulin aspart  3 Units Subcutaneous TID WC  . insulin glargine  35 Units Subcutaneous QHS  . lanthanum  1,000 mg Oral TID WC  . multivitamin  1 tablet Oral QHS  . pantoprazole  40 mg Oral Daily  . sodium chloride flush  3 mL Intravenous Q12H   Continuous Infusions:   Time spent: 25 minutes.   LOS: 4 days   RAMA,CHRISTINA  [facility] Pager [phone]. If unable to reach me by pager, please call my cell phone at [phone].  *Please refer to [email or web address], password [email or web address] to get updated schedule on who will round on this patient, as hospitalists switch teams weekly. If 7PM-7AM, please contact night-coverage at [email or web address], password [email or web address] for any overnight needs.  03/30/2016, 10:06 AM

## 2016-03-31 ENCOUNTER — Inpatient Hospital Stay (HOSPITAL_COMMUNITY): Payer: Medicare Other | Admitting: Certified Registered"

## 2016-03-31 ENCOUNTER — Inpatient Hospital Stay (HOSPITAL_COMMUNITY): Payer: Medicare Other

## 2016-03-31 ENCOUNTER — Encounter (HOSPITAL_COMMUNITY): Payer: Self-pay | Admitting: Certified Registered"

## 2016-03-31 ENCOUNTER — Encounter (HOSPITAL_COMMUNITY)
Admission: EM | Disposition: A | Discharge status: Home Health | Payer: Self-pay | Source: Home / Self Care | Attending: Internal Medicine

## 2016-03-31 HISTORY — PX: INSERTION OF DIALYSIS CATHETER: SHX1324

## 2016-03-31 LAB — RENAL FUNCTION PANEL
ALBUMIN: 2.7 g/dL — AB (ref 3.5–5.0)
ANION GAP: 13 (ref 5–15)
BUN: 71 mg/dL — AB (ref 6–20)
CHLORIDE: 95 mmol/L — AB (ref 101–111)
CO2: 25 mmol/L (ref 22–32)
Calcium: 8.2 mg/dL — ABNORMAL LOW (ref 8.9–10.3)
Creatinine, Ser: 13.1 mg/dL — ABNORMAL HIGH (ref 0.61–1.24)
GFR calc Af Amer: 4 mL/min — ABNORMAL LOW (ref >60)
GFR, EST NON AFRICAN AMERICAN: 4 mL/min — AB (ref >60)
Glucose, Bld: 148 mg/dL — ABNORMAL HIGH (ref 65–99)
PHOSPHORUS: 6.2 mg/dL — AB (ref 2.5–4.6)
POTASSIUM: 4.3 mmol/L (ref 3.5–5.1)
Sodium: 133 mmol/L — ABNORMAL LOW (ref 135–145)

## 2016-03-31 LAB — GLUCOSE, CAPILLARY
GLUCOSE-CAPILLARY: 167 mg/dL — AB (ref 65–99)
GLUCOSE-CAPILLARY: 170 mg/dL — AB (ref 65–99)
GLUCOSE-CAPILLARY: 139 mg/dL — AB (ref 65–99)
Glucose-Capillary: 131 mg/dL — ABNORMAL HIGH (ref 65–99)
Glucose-Capillary: 181 mg/dL — ABNORMAL HIGH (ref 65–99)

## 2016-03-31 LAB — CBC
HEMATOCRIT: 28.4 % — AB (ref 39.0–52.0)
HEMOGLOBIN: 8.7 g/dL — AB (ref 13.0–17.0)
MCH: 31.3 pg (ref 26.0–34.0)
MCHC: 30.6 g/dL (ref 30.0–36.0)
MCV: 102.2 fL — AB (ref 78.0–100.0)
Platelets: 254 10*3/uL (ref 150–400)
RBC: 2.78 MIL/uL — AB (ref 4.22–5.81)
RDW: 16.6 % — AB (ref 11.5–15.5)
WBC: 12.1 10*3/uL — AB (ref 4.0–10.5)

## 2016-03-31 LAB — POCT I-STAT 4, (NA,K, GLUC, HGB,HCT)
Glucose, Bld: 144 mg/dL — ABNORMAL HIGH (ref 65–99)
HCT: 31 % — ABNORMAL LOW (ref 39.0–52.0)
HEMOGLOBIN: 10.5 g/dL — AB (ref 13.0–17.0)
POTASSIUM: 4.2 mmol/L (ref 3.5–5.1)
SODIUM: 133 mmol/L — AB (ref 135–145)

## 2016-03-31 SURGERY — INSERTION OF DIALYSIS CATHETER
Anesthesia: Monitor Anesthesia Care | Site: Groin | Laterality: Right

## 2016-03-31 MED ORDER — FENTANYL CITRATE (PF) 250 MCG/5ML IJ SOLN
INTRAMUSCULAR | Status: AC
  Filled 2016-03-31: qty 5

## 2016-03-31 MED ORDER — HEPARIN SODIUM (PORCINE) 1000 UNIT/ML IJ SOLN
INTRAMUSCULAR | Status: AC
  Filled 2016-03-31: qty 1

## 2016-03-31 MED ORDER — SODIUM CHLORIDE 0.9 % IV SOLN
INTRAVENOUS | Status: DC | PRN
  Administered 2016-03-31: 500 mL

## 2016-03-31 MED ORDER — WARFARIN - PHARMACIST DOSING INPATIENT: Freq: Every day | Status: DC

## 2016-03-31 MED ORDER — MEPERIDINE HCL 25 MG/ML IJ SOLN: 6.2500 mg | INTRAMUSCULAR | Status: DC | PRN

## 2016-03-31 MED ORDER — LIDOCAINE HCL (PF) 1 % IJ SOLN
INTRAMUSCULAR | Status: AC
  Filled 2016-03-31: qty 30

## 2016-03-31 MED ORDER — CEFAZOLIN SODIUM-DEXTROSE 2-4 GM/100ML-% IV SOLN
2.0000 g | Freq: Once | INTRAVENOUS | Status: AC
  Administered 2016-04-01: 2 g via INTRAVENOUS
  Filled 2016-03-31 (×2): qty 100

## 2016-03-31 MED ORDER — DEXTROSE 5 % IV SOLN
INTRAVENOUS | Status: AC
  Filled 2016-03-31: qty 1.5

## 2016-03-31 MED ORDER — WARFARIN SODIUM 10 MG PO TABS
10.0000 mg | ORAL_TABLET | Freq: Once | ORAL | Status: AC
  Administered 2016-03-31: 10 mg via ORAL
  Filled 2016-03-31 (×2): qty 1

## 2016-03-31 MED ORDER — SODIUM CHLORIDE 0.9 % IV SOLN
INTRAVENOUS | Status: DC
  Administered 2016-03-31: 12:00:00 via INTRAVENOUS

## 2016-03-31 MED ORDER — PROPOFOL 10 MG/ML IV BOLUS
INTRAVENOUS | Status: AC
  Filled 2016-03-31: qty 20

## 2016-03-31 MED ORDER — 0.9 % SODIUM CHLORIDE (POUR BTL) OPTIME
TOPICAL | Status: DC | PRN
  Administered 2016-03-31: 1000 mL

## 2016-03-31 MED ORDER — FENTANYL CITRATE (PF) 100 MCG/2ML IJ SOLN: 25.0000 ug | INTRAMUSCULAR | Status: DC | PRN

## 2016-03-31 MED ORDER — LIDOCAINE-EPINEPHRINE (PF) 1 %-1:200000 IJ SOLN
INTRAMUSCULAR | Status: AC
  Filled 2016-03-31: qty 30

## 2016-03-31 MED ORDER — PROTAMINE SULFATE 10 MG/ML IV SOLN
INTRAVENOUS | Status: AC
  Filled 2016-03-31: qty 5

## 2016-03-31 MED ORDER — HEPARIN SODIUM (PORCINE) 1000 UNIT/ML IJ SOLN
INTRAMUSCULAR | Status: DC | PRN
  Administered 2016-03-31: 6 mL

## 2016-03-31 MED ORDER — LIDOCAINE-EPINEPHRINE (PF) 1 %-1:200000 IJ SOLN
INTRAMUSCULAR | Status: DC | PRN
  Administered 2016-03-31: 20 mL

## 2016-03-31 MED ORDER — IODIXANOL 320 MG/ML IV SOLN
INTRAVENOUS | Status: DC | PRN
  Administered 2016-03-31: 20 mL via INTRAVENOUS

## 2016-03-31 MED ORDER — SODIUM CHLORIDE 0.9 % IV SOLN
INTRAVENOUS | Status: DC | PRN
  Administered 2016-03-31: 13:00:00 via INTRAVENOUS

## 2016-03-31 MED ORDER — INSULIN ASPART 100 UNIT/ML ~~LOC~~ SOLN
5.0000 [IU] | Freq: Three times a day (TID) | SUBCUTANEOUS | Status: DC
  Administered 2016-04-01: 5 [IU] via SUBCUTANEOUS

## 2016-03-31 MED ORDER — ROCURONIUM BROMIDE 50 MG/5ML IV SOLN
INTRAVENOUS | Status: AC
  Filled 2016-03-31: qty 1

## 2016-03-31 MED ORDER — IOPAMIDOL (ISOVUE-300) INJECTION 61%
INTRAVENOUS | Status: AC
  Administered 2016-03-31: 100 mL
  Filled 2016-03-31: qty 100

## 2016-03-31 SURGICAL SUPPLY — 46 items
BAG DECANTER FOR FLEXI CONT (MISCELLANEOUS) ×3 IMPLANT
BIOPATCH RED 1 DISK 7.0 (GAUZE/BANDAGES/DRESSINGS) ×2 IMPLANT
BIOPATCH RED 1IN DISK 7.0MM (GAUZE/BANDAGES/DRESSINGS) ×1
CATH ANGIO 5F BER2 65CM (CATHETERS) ×3 IMPLANT
CATH PALINDROME RT-P 15FX19CM (CATHETERS) IMPLANT
CATH PALINDROME RT-P 15FX23CM (CATHETERS) IMPLANT
CATH PALINDROME RT-P 15FX28CM (CATHETERS) IMPLANT
CATH PALINDROME RT-P 15FX55CM (CATHETERS) ×3 IMPLANT
CHLORAPREP W/TINT 26ML (MISCELLANEOUS) ×3 IMPLANT
COVER PROBE W GEL 5X96 (DRAPES) IMPLANT
DRAPE C-ARM 42X72 X-RAY (DRAPES) ×3 IMPLANT
DRAPE CHEST BREAST 15X10 FENES (DRAPES) ×3 IMPLANT
DRAPE INCISE IOBAN 66X45 STRL (DRAPES) ×3 IMPLANT
GAUZE SPONGE 2X2 8PLY STRL LF (GAUZE/BANDAGES/DRESSINGS) ×1 IMPLANT
GAUZE SPONGE 4X4 16PLY XRAY LF (GAUZE/BANDAGES/DRESSINGS) ×3 IMPLANT
GLOVE BIO SURGEON STRL SZ7.5 (GLOVE) ×3 IMPLANT
GLOVE BIOGEL PI IND STRL 7.0 (GLOVE) ×2 IMPLANT
GLOVE BIOGEL PI IND STRL 8 (GLOVE) ×1 IMPLANT
GLOVE BIOGEL PI INDICATOR 7.0 (GLOVE) ×4
GLOVE BIOGEL PI INDICATOR 8 (GLOVE) ×2
GLOVE SURG SS PI 6.5 STRL IVOR (GLOVE) ×3 IMPLANT
GLOVE SURG SS PI 7.0 STRL IVOR (GLOVE) ×3 IMPLANT
GOWN STRL REUS W/ TWL LRG LVL3 (GOWN DISPOSABLE) ×2 IMPLANT
GOWN STRL REUS W/ TWL XL LVL3 (GOWN DISPOSABLE) ×1 IMPLANT
GOWN STRL REUS W/TWL LRG LVL3 (GOWN DISPOSABLE) ×4
GOWN STRL REUS W/TWL XL LVL3 (GOWN DISPOSABLE) ×2
GUIDEWIRE ANGLED .035X150CM (WIRE) ×3 IMPLANT
KIT BASIN OR (CUSTOM PROCEDURE TRAY) ×3 IMPLANT
KIT ROOM TURNOVER OR (KITS) ×3 IMPLANT
LIQUID BAND (GAUZE/BANDAGES/DRESSINGS) ×3 IMPLANT
NEEDLE 18GX1X1/2 (RX/OR ONLY) (NEEDLE) ×3 IMPLANT
NEEDLE 22X1 1/2 (OR ONLY) (NEEDLE) ×3 IMPLANT
NEEDLE HYPO 25GX1X1/2 BEV (NEEDLE) ×3 IMPLANT
NS IRRIG 1000ML POUR BTL (IV SOLUTION) ×3 IMPLANT
PACK SURGICAL SETUP 50X90 (CUSTOM PROCEDURE TRAY) ×3 IMPLANT
PAD ARMBOARD 7.5X6 YLW CONV (MISCELLANEOUS) ×6 IMPLANT
SPONGE GAUZE 2X2 STER 10/PKG (GAUZE/BANDAGES/DRESSINGS) ×2
SUT ETHILON 3 0 PS 1 (SUTURE) ×3 IMPLANT
SUT VICRYL 4-0 PS2 18IN ABS (SUTURE) ×3 IMPLANT
SYR 20CC LL (SYRINGE) ×6 IMPLANT
SYR 5ML LL (SYRINGE) ×6 IMPLANT
SYR CONTROL 10ML LL (SYRINGE) ×3 IMPLANT
SYRINGE 10CC LL (SYRINGE) ×3 IMPLANT
SYRINGE 20CC LL (MISCELLANEOUS) ×4 IMPLANT
WATER STERILE IRR 1000ML POUR (IV SOLUTION) ×3 IMPLANT
WIRE AMPLATZ SS-J .035X180CM (WIRE) ×3 IMPLANT

## 2016-03-31 NOTE — Progress Notes (Signed)
Hemodialysis- Pt signed of treatment AMA after 2 hours and 45 minutes. States "ive been gone all day and i just cant do anymore." AMA form placed in chart. Unable to uf d/t low bp with orders to keep sbp>95. Net UF 0. Report called to primary RN.

## 2016-03-31 NOTE — Interval H&P Note (Signed)
History and Physical Interval Note:  03/31/2016 12:30 PM  Shane Hamilton  has presented today for surgery, with the diagnosis of End Stage Renal Disease N18.6  The various methods of treatment have been discussed with the patient and family. After consideration of risks, benefits and other options for treatment, the patient has consented to  Procedure(s): INSERTION OF DIALYSIS CATHETER (N/A) as a surgical intervention .  The patient's history has been reviewed, patient examined, no change in status, stable for surgery.  I have reviewed the patient's chart and labs.  Questions were answered to the patient's satisfaction.     Waverly Ferrariickson, Desi Rowe

## 2016-03-31 NOTE — Progress Notes (Addendum)
Pharmacy Note  Shane Hamilton is a 50 y.o. male admitted on 03/26/2016 with sepsis.  Pharmacy has been consulted for cefazolin dosing. Patient was initially started on vancomycin and ceftazidime (HD doses).  BCID resulted in MSSA. The patient was transitioned to Cefazolin 6/23  Currently awaiting HD access to be placed today Then to HD today  Plan: Continue to follow for HD schedule Cefazolin 2 grams iv post HD  Height: 5\' 11"  (180.3 cm) Weight: (!) 404 lb 4.8 oz (183.389 kg) IBW/kg (Calculated) : 75.3  Temp (24hrs), Avg:98.3 F (36.8 C), Min:98.2 F (36.8 C), Max:98.4 F (36.9 C)   Recent Labs Lab 03/26/16 1235 03/26/16 1246 03/26/16 1509 03/27/16 0032 03/27/16 0315 03/28/16 0546 03/29/16 0444 03/30/16 0906  WBC 17.2*  --   --   --  13.5*  --  10.5  --   CREATININE 13.22*  --   --   --  8.05* 6.88* 6.82* 9.94*  LATICACIDVEN  --  2.34* 1.65 1.2 0.8  --   --   --     Estimated Creatinine Clearance: 15.1 mL/min (by C-G formula based on Cr of 9.94).    Allergies  Allergen Reactions  . Aspirin Other (See Comments)    Reaction:  GI upset     Antimicrobials this admission: Zosyn 6/22 x 1 Vanc 6/22 >> 6/23 Ceftazidime 6/22 >> 6/23 Cefazolin 6/23 >>  Microbiology results: 6/22 BCx: MSSA 6/26 BCx:   Thank you for allowing pharmacy to be a part of this patient's care. Okey RegalLisa Renesha Lizama, PharmD [phone] 03/31/2016 8:48 AM

## 2016-03-31 NOTE — H&P (View-Only) (Signed)
Patient name: Shane MinaReginald D Weitzel MRN: [id number] DOB: 09/22/1966 Sex: male   Referred by: Arlean HoppingSchertz  Reason for referral:  Chief Complaint  Patient presents with  . Fever    HISTORY OF PRESENT ILLNESS: Asked to see for catheter placement. Very complex 50 year old gentleman weighing excess of 400 pounds. He has had very difficult access history with no prior access working well for him. He did have attempts at a left arm fistula with multiple stenting and eventual infection of the stents. He has been relying on tunneled catheters. He underwent right arm venogram Dr. Darrick Pennafields in December 2016 showing innominate vein occlusion. He was felt to have a section of his left IJ catheter which had been placed by interventional radiology. This was discontinued and removed by interventional radiology yesterday. During this procedure ultrasound of his internal jugular vein on the left revealed occlusion. He had been scheduled for interventional radiology place a new catheter today but they were unable to do this since it is impossible for the patient alive flat without general anesthesia. I have discussed this with the patient. This is my first encounter with this patient. He has a great deal difficulty breathing even sitting in a chair. Is coughing and very short of breath.  Past Medical History  Diagnosis Date  . Type 2 diabetes mellitus, uncontrolled (HCC)        . Morbid obesity (HCC)   . OSA (obstructive sleep apnea)   . Septic shock(785.52)   . Anemia   . Hypertension   . Gout   . LOC (loss of consciousness)   . DVT (deep venous thrombosis) (HCC) 2011  . Cardiomegaly   . Chronic anticoagulation   . Chronic pancreatitis (HCC)   . Secondary hyperparathyroidism (HCC)   . Pneumonia   . GERD (gastroesophageal reflux disease)   . Seizures (HCC)     pt denies  . Headache     migraines  . Shortness of breath dyspnea     with exertion  . ESRD (end stage renal disease) (HCC)     [facility] [place]  . Kidney stones   . Abscess of right arm 06/17/2015  . Arteriovenous fistula infection [facility](HCC)     Past Surgical History  Procedure Laterality Date  . Av fistula placement    . Amputation finger / thumb Right   . I&d extremity Left 06/20/2015    Procedure: IRRIGATION AND DEBRIDEMENT EXTREMITY;  Surgeon: Renford DillsGregory G Schnier, MD;  Location: [facility] ORS;  Service: Vascular;  Laterality: Left;  . Application of wound vac Left 06/20/2015    Procedure: APPLICATION OF WOUND VAC;  Surgeon: Renford DillsGregory G Schnier, MD;  Location: [facility] ORS;  Service: Vascular;  Laterality: Left;  . Av fistula placement Left 06/20/2015    Procedure: ARTERIOVENOUS (AV) FISTULA  ligation , excision;  Surgeon: Renford DillsGregory G Schnier, MD;  Location: [facility] ORS;  Service: Vascular;  Laterality: Left;  . Peripheral vascular catheterization N/A 06/25/2015    Procedure: Dialysis/Perma Catheter Insertion;  Surgeon: Annice NeedyJason S Dew, MD;  Location: [facility] INVASIVE CV LAB;  Service: Cardiovascular;  Laterality: N/A;  . Insertion of dialysis catheter N/A 06/25/2015    Procedure: place a tunnelled catheter for long term use;  Surgeon: Annice NeedyJason S Dew, MD;  Location: [facility] ORS;  Service: Vascular;  Laterality: N/A;  . Insertion of dialysis catheter Left 07/25/2015    Procedure: INSERTION OF LEFT INTERNAL JUGULAR DIALYSIS CATHETER;  Surgeon: Pryor OchoaJames D Lawson, MD;  Location: [facility] OR;  Service: Vascular;  Laterality: Left;  . Insertion of dialysis catheter Left 08/01/2015    Procedure: REMOVAL OF DIALYSIS CATHETER; PLACEMNET OF NEW DIALYSIS CATHETER;  Surgeon: Pryor OchoaJames D Lawson, MD;  [facility];  Service: Vascular;  Laterality: Left;  . Peripheral vascular catheterization Bilateral 09/13/2015    Procedure: Upper Extremity Venography;  Surgeon: Sherren Kernsharles E Fields, MD;  [facility];  Service: Cardiovascular;  Laterality: Bilateral;  . Exchange of a dialysis catheter Left 11/13/2015    Procedure: EXCHANGE OF A DIALYSIS CATHETER- LEFT INTERNAL  JUGULAR;  Surgeon: Nada LibmanVance W Brabham, MD;  Location: [facility] OR;  Service: Vascular;  Laterality: Left;    Social History   Social History  . Marital Status: Married    Spouse Name: N/A  . Number of Children: N/A  . Years of Education: N/A   Occupational History  . disabled     Social History Main Topics  . Smoking status: Never Smoker   . Smokeless tobacco: Never Used  . Alcohol Use: No  . Drug Use: No  . Sexual Activity: Not on file   Other Topics Concern  . Not on file   Social History Narrative    Family History  Problem Relation Age of Onset  . Diabetes Mother   . Hypertension Mother   . Diabetes Father   . Hypertension Father   . Hypertension Brother     Allergies as of 03/26/2016 - Review Complete 03/26/2016  Allergen Reaction Noted  . Aspirin Other (See Comments) 08/05/2011    Current Facility-Administered Medications on File Prior to Encounter  Medication Dose Route Frequency Provider Last Rate Last Dose  . cefUROXime (ZINACEF) 1.5 g in dextrose 5 % 50 mL IVPB  1.5 g Intravenous 30 min Pre-Op Fransisco HertzBrian L Chen, MD       Current Outpatient Prescriptions on File Prior to Encounter  Medication Sig Dispense Refill  . allopurinol (ZYLOPRIM) 300 MG tablet Take 300 mg by mouth 2 (two) times daily.    Marland Kitchen. atorvastatin (LIPITOR) 10 MG tablet Take 10 mg by mouth daily.    . calcium acetate (PHOSLO) 667 MG capsule Take 2,001 mg by mouth 3 (three) times daily with meals.     . carvedilol (COREG) 3.125 MG tablet Take 1 tablet (3.125 mg total) by mouth 2 (two) times daily with a meal. 60 tablet 0  . cinacalcet (SENSIPAR) 60 MG tablet Take 60 mg by mouth daily.    . clonazePAM (KLONOPIN) 1 MG tablet Take 1 mg by mouth 2 (two) times daily.    . colchicine 0.6 MG tablet Take 0.6 mg by mouth 3 (three) times daily.     Marland Kitchen. gabapentin (NEURONTIN) 300 MG capsule Take 600 mg by mouth every 8 (eight) hours.     . insulin glargine (LANTUS) 100 UNIT/ML injection Inject 0.35 mLs (35 Units  total) into the skin at bedtime. (Patient taking differently: Inject 35 Units into the skin at bedtime. ) 10 mL 11  . lanthanum (FOSRENOL) 1000 MG chewable tablet Chew 1,000 mg by mouth 3 (three) times daily with meals.     . multivitamin (RENA-VIT) TABS tablet Take 1 tablet by mouth daily.   3  . omeprazole (PRILOSEC) 20 MG capsule Take 20 mg by mouth 2 (two) times daily before a meal.     . Oxycodone HCl 10 MG TABS Take 1 tablet (10 mg total) by mouth 2 (two) times daily as needed (pain). 20 tablet 0  . BD PEN NEEDLE  NANO U/F 32G X 4 MM MISC        REVIEW OF SYSTEMS:  Negative except for above  PHYSICAL EXAMINATION:  General: The patient is a morbidly obese male, in no acute distress. Vital signs are BP 103/63 mmHg  Pulse 104  Temp(Src) 98.5 F (36.9 C) (Oral)  Resp 18  Ht 5' 11" (1.803 m)  Wt 404 lb 4.8 oz (183.389 kg)  BMI 56.41 kg/m2  SpO2 81% Pulmonary: Markedly increased work of breathing Abdomen: Soft and non-tender  Musculoskeletal: There are no major deformities.  Neurologic: No focal weakness or paresthesias are detected, Skin: There are no ulcer or rashes noted. Psychiatric: The patient has normal affect. Cardiovascular: Palpable radial pulses    Impression and Plan:  Discussed with Dr. Schertz. 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 Could potentially be a candidate for left subclavian catheter placement. This would not likelihood result in occlusion of his left subclavian vein over time but would be difficult for him to have left arm access. The only other option would be femoral catheter. This obviously would carry high likelihood of infection due to his morbid obesity. We are available for a catheter placement tomorrow. Obviously would be at extremely high risk for prolonged ventilatory support if he does have a general anesthesia for the procedure but this may be the only option since he cannot  lie flat. Will keep him nothing by mouth tonight for potential catheter placement tomorrow    Arrington Yohe [facility] [place] Office: [phone]         

## 2016-03-31 NOTE — Op Note (Signed)
    NAME: Shane Hamilton   MRN: [id number] DOB: 04/11/1966    DATE OF OPERATION: 03/31/2016  PREOP DIAGNOSIS: stage V chronic kidney disease  POSTOP DIAGNOSIS: same  PROCEDURE: Placement of 55 cm right femoral tunneled dialysis catheter  SURGEON: Di Kindlehristopher S. Edilia Boickson, MD, FACS  ASSIST: nnone  ANESTHESIA: ocal with sedation   EBL: minimal  INDICATIONS: Shane Hamilton is a 50 y.o. male who presents for placement of a dialysis catheter.  FINDINGS: Patent right femoral vein. Catheter tip in right atrium.  TECHNIQUE: The patient was taken to the operating room and sedated by anesthesia.  Both groins were prepped and draped in usual sterile fashion. Under ultrasound guidance, after the skin was anesthetized, the left femoral vein was cannulated. A guidewire was introduced into the inferior vena cava. I used a Kumpe catheter to direct the wire up to the right atrium. The catheter was then advanced over the wire and the wire exchanged for an Amplatz wire. The tract over the wire was dilated  And then the dilator and peel-away sheath were advanced over the wire and the dilator removed. The catheter was threaded over the wire through the peel-away sheath up to the right atrium. The peel-away sheath was then removed. The exit site for the catheter was selected and the skin anesthetized between the 2 areas. The catheter was then brought to the tunnel cut the appropriate length and the distal ports were attached.  In order to be sure that the catheter was not kinked I anesthetized the skin in the groin and  Extended the incision to allow visualization of the catheter in the distal not appear to be kinked. Both ports withdrew easily within flushed with heparin saline and filled with concentrated heparin.  The femoral incision was closed with a 4-0 Vicryl suture. The catheter was secured at its exit site with a 3-0 nylon suture. Sterile dressing was applied. The patient tolerated the procedure well and  was transferred to the recovery room in stable condition. All needle and sponge counts were correct.  Waverly Ferrarihristopher Dickson, MD, FACS [facility] [place]  DATE OF DICTATION:   03/31/2016
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
## 2016-03-31 NOTE — Transfer of Care (Signed)
Immediate Anesthesia Transfer of Care Note  Patient: Shane Hamilton  Procedure(s) Performed: Procedure(s): INSERTION OF DIALYSIS CATHETER RIGHT FEMORAL  (Right)  Patient Location: PACU  Anesthesia Type:MAC  Level of Consciousness: awake, alert , oriented and patient cooperative  Airway & Oxygen Therapy: Patient Spontanous Breathing and Patient connected to nasal cannula oxygen  Post-op Assessment: Report given to RN, Post -op Vital signs reviewed and stable and Patient moving all extremities  Post vital signs: Reviewed and stable  Last Vitals:  Filed Vitals:   03/31/16 0419 03/31/16 0920  BP: 142/60 123/65  Pulse: 111 103  Temp: 36.9 C 37 C  Resp: 20 20    Last Pain:  Filed Vitals:   03/31/16 0920  PainSc: Asleep      Patients Stated Pain Goal: 2 (03/30/16 2210)  Complications: No apparent anesthesia complications

## 2016-03-31 NOTE — Progress Notes (Signed)
Progress Note    Shane MinaReginald D Wible  ZOX:[id number] DOB: 05/25/1966  DOA: 03/26/2016 PCP: Darrow BussingKOIRALA,DIBAS, MD    Brief Narrative:   Shane Hamilton is an 50 y.o. male with a Past Medical History of DM, obesity, anemia, HTN, gout, PT, hyperparathyroidism/secondary, GERD, seizures, ESRD on dialysis who Was admitted 03/26/16 with Sepsis. Blood cultures growing MSSA.  Assessment/Plan:   Principal Problems:   Sepsis (HCC) secondary to MSSA bacteremia Admitted and blood cultures obtained. Initially placed on broad-spectrum antibiotics with Zosyn and vancomycin. Antibiotics narrowed to Ancef secondary to MSSA + blood cultures. ID consulting. Source thought to be dialysis catheter but the patient also has left calf erythema, redness and swelling concerning for cellulitis and possible deeper tissue infection. Unfortunately, CT with contrast could not be done secondary to lack of vascular access. Dialysis catheter removed, IR had difficulty trying to place new line.Vascular surgeon to obtain access.    End-stage renal disease on hemodialysis (HCC) HD per nephrology. Continue PhosLo, Hectorol and Sensipar.  Dialysis catheter removed, IR had difficulty trying to place new dialysis catheter. Seen by vascular surgeon with plans to place a dialysis catheter today. Unfortunately, the patient's morbid obesity and history of right innominate and left IJ occlusion complicate the ability to find access.  Active Problems:   Left leg cellulitis Unable to get CT r/o deep tissue infection secondary to lack of IV access for contrast administration. Continue Ancef. No evidence of left leg DVT status post Dopplers. Can consider reordering once vascular access established.    Nonsustained V. Tach Patient noted to have a 12 beat run of ventricular tachycardia 03/29/16 at 5:30 AM. Magnesium OK.    Obstructive sleep apnea CPAP ordered daily at bedtime.    DVT (deep venous thrombosis) (HCC)/chronic anticoagulation   Hold Coumadin in anticipation of surgery for removing tunneled IJ catheter. Resume Coumadin post procedure after vascular access reestablished.    Type 2 diabetes mellitus, uncontrolled (HCC) /diabetic neuropathy Currently being managed with moderate scale SSI 3 times a day/4 units of meal coverage and Lantus 35 units daily. CBGs 133-267. Increase meal coverage to 5 units. Continue Neurontin for neuropathy.    Gout Continue allopurinol.   Family Communication/Anticipated D/C date and plan/Code Status   DVT prophylaxis: On coumadin, Resume after vascular access reestablished. Code Status: Full Code.  Family Communication: Several family updated at the bedside. Disposition Plan: Home when stable.   Medical Consultants:    Nephrology  Vascular Surgery   Procedures:   Tunneled IJ removed 03/29/16  Anti-Infectives:   Zosyn 03/26/16---> 03/27/16 Vancomycin 03/26/16---> 03/27/16 Fortaz 03/26/16---> 03/27/16 Ancef 03/27/16--->   Subjective:   Shane Minaeginald D Fischler is sleepy today. No new or different complaints. Being taken down to surgery.  Objective:    Filed Vitals:   03/30/16 0503 03/30/16 1936 03/31/16 0049 03/31/16 0419  BP: 103/63 139/73  142/60  Pulse: 104 104 100 111  Temp: 98.5 F (36.9 C) 98.2 F (36.8 C)  98.4 F (36.9 C)  TempSrc: Oral Oral  Oral  Resp: 18 20 20 20   Height:      Weight:      SpO2: 81% 97% 93% 93%    Intake/Output Summary (Last 24 hours) at 03/31/16 0740 Last data filed at 03/30/16 2200  Gross per 24 hour  Intake   1080 ml  Output      0 ml  Net   1080 ml   Filed Weights   03/29/16 0500 03/29/16 1139 03/29/16 2051  Weight: 140.6 kg (309 lb 15.5 oz) 182.8 kg (403 lb) 183.389 kg (404 lb 4.8 oz)    Exam: General exam: Appears calm and comfortable.  Respiratory system: Clear to auscultation. Respiratory effort normal. Cardiovascular system: S1 & S2 heard, RRR. No JVD,  rubs, gallops or clicks. No murmurs. Gastrointestinal system:  Abdomen is nondistended, soft and nontender. No organomegaly or masses felt. Normal bowel sounds heard. Central nervous system: Alert and oriented. No focal neurological deficits. Extremities: Erythema and swelling to the lower extremities, left calf hot/erythematous. Skin: Scarring left upper arm from AV graft. Psychiatry: Judgement and insight appear normal. Mood & affect appropriate.   Data Reviewed:   I have personally reviewed following labs and imaging studies:  Labs: Basic Metabolic Panel:  Recent Labs Lab 03/26/16 1235 03/27/16 0315 03/28/16 0546 03/29/16 0444 03/29/16 0919 03/30/16 0906  NA 134* 135 131* 132*  --  133*  K 5.3* 4.4 3.8 3.7  --  3.7  CL 96* 99* 93* 96*  --  94*  CO2 22 29 26 26   --  24  GLUCOSE 257* 203* 243* 189*  --  209*  BUN 65* 26* 27* 27*  --  50*  CREATININE 13.22* 8.05* 6.88* 6.82*  --  9.94*  CALCIUM 9.0 8.5* 8.2* 8.2*  --  8.1*  MG  --   --   --   --  1.9  --   PHOS  --   --  3.6 4.3  --  4.7*   GFR Estimated Creatinine Clearance: 15.1 mL/min (by C-G formula based on Cr of 9.94). Liver Function Tests:  Recent Labs Lab 03/26/16 1235 03/28/16 0546 03/29/16 0444 03/30/16 0906  AST 27  --   --   --   ALT 24  --   --   --   ALKPHOS 138*  --   --   --   BILITOT 0.9  --   --   --   PROT 8.4*  --   --   --   ALBUMIN 3.7 2.9* 2.8* 2.7*   Coagulation profile  Recent Labs Lab 03/27/16 0032 03/29/16 0444  INR 3.08* 1.44    CBC:  Recent Labs Lab 03/26/16 1235 03/27/16 0315 03/29/16 0444  WBC 17.2* 13.5* 10.5  NEUTROABS 14.0*  --   --   HGB 10.5* 9.5* 8.7*  HCT 34.3* 32.0* 28.7*  MCV 102.1* 103.9* 102.1*  PLT 226 205 193   CBG:  Recent Labs Lab 03/29/16 1635 03/29/16 2038 03/30/16 0803 03/30/16 1204 03/30/16 1941  GLUCAP 189* 267* 212* 180* 133*   Sepsis Labs:  Recent Labs Lab 03/26/16 1235 03/26/16 1246 03/26/16 1509 03/27/16 0032 03/27/16 0315 03/29/16 0444  PROCALCITON  --   --   --  6.01  --   --    WBC 17.2*  --   --   --  13.5* 10.5  LATICACIDVEN  --  2.34* 1.65 1.2 0.8  --    Microbiology Recent Results (from the past 240 hour(s))  Blood Culture (routine x 2)     Status: Abnormal   Collection Time: 03/26/16 12:37 PM  Result Value Ref Range Status   Specimen Description BLOOD LEFT ANTECUBITAL  Final   Special Requests   Final    BOTTLES DRAWN AEROBIC AND ANAEROBIC 10CC AER 5CC ANA   Culture  Setup Time   Final    GRAM POSITIVE COCCI IN CLUSTERS AEROBIC BOTTLE ONLY CRITICAL RESULT CALLED TO, READ BACK BY AND VERIFIED WITH:  M BELL,PHARMD AT 0622 03/28/16 BY L BENFIELD     Culture (A)  Final    STAPHYLOCOCCUS AUREUS SUSCEPTIBILITIES PERFORMED ON PREVIOUS CULTURE WITHIN THE LAST 5 DAYS.    Report Status 03/29/2016 FINAL  Final  Blood Culture (routine x 2)     Status: Abnormal   Collection Time: 03/26/16  1:07 PM  Result Value Ref Range Status   Specimen Description BLOOD RIGHT ARM  Final   Special Requests BOTTLES DRAWN AEROBIC AND ANAEROBIC 5CC  Final   Culture  Setup Time   Final    GRAM POSITIVE COCCI IN CLUSTERS IN BOTH AEROBIC AND ANAEROBIC BOTTLES CRITICAL RESULT CALLED TO, READ BACK BY AND VERIFIED WITH: CDelsa Sale D AT 0840 ON [id number] BY Lucienne Capers    Culture STAPHYLOCOCCUS AUREUS (A)  Final   Report Status 03/29/2016 FINAL  Final   Organism ID, Bacteria STAPHYLOCOCCUS AUREUS  Final      Susceptibility   Staphylococcus aureus - MIC*    CIPROFLOXACIN <=0.5 SENSITIVE Sensitive     ERYTHROMYCIN <=0.25 SENSITIVE Sensitive     GENTAMICIN <=0.5 SENSITIVE Sensitive     OXACILLIN 0.5 SENSITIVE Sensitive     TETRACYCLINE 8 INTERMEDIATE Intermediate     VANCOMYCIN <=0.5 SENSITIVE Sensitive     TRIMETH/SULFA <=10 SENSITIVE Sensitive     CLINDAMYCIN <=0.25 SENSITIVE Sensitive     RIFAMPIN <=0.5 SENSITIVE Sensitive     Inducible Clindamycin NEGATIVE Sensitive     * STAPHYLOCOCCUS AUREUS  Blood Culture ID Panel (Reflexed)     Status: Abnormal   Collection Time:  03/26/16  1:07 PM  Result Value Ref Range Status   Enterococcus species NOT DETECTED NOT DETECTED Final   Vancomycin resistance NOT DETECTED NOT DETECTED Final   Listeria monocytogenes NOT DETECTED NOT DETECTED Final   Staphylococcus species DETECTED (A) NOT DETECTED Final    Comment: CRITICAL RESULT CALLED TO, READ BACK BY AND VERIFIED WITH: C. STEWART, PHARM D AT 0840 ON [id number] BY S. YARBROUGH    Staphylococcus aureus DETECTED (A) NOT DETECTED Final    Comment: CRITICAL RESULT CALLED TO, READ BACK BY AND VERIFIED WITH: C. STEWART, PHARM D AT 0840 ON [id number] BY S. YARBROUGH    Methicillin resistance NOT DETECTED NOT DETECTED Final   Streptococcus species NOT DETECTED NOT DETECTED Final   Streptococcus agalactiae NOT DETECTED NOT DETECTED Final   Streptococcus pneumoniae NOT DETECTED NOT DETECTED Final   Streptococcus pyogenes NOT DETECTED NOT DETECTED Final   Acinetobacter baumannii NOT DETECTED NOT DETECTED Final   Enterobacteriaceae species NOT DETECTED NOT DETECTED Final   Enterobacter cloacae complex NOT DETECTED NOT DETECTED Final   Escherichia coli NOT DETECTED NOT DETECTED Final   Klebsiella oxytoca NOT DETECTED NOT DETECTED Final   Klebsiella pneumoniae NOT DETECTED NOT DETECTED Final   Proteus species NOT DETECTED NOT DETECTED Final   Serratia marcescens NOT DETECTED NOT DETECTED Final   Carbapenem resistance NOT DETECTED NOT DETECTED Final   Haemophilus influenzae NOT DETECTED NOT DETECTED Final   Neisseria meningitidis NOT DETECTED NOT DETECTED Final   Pseudomonas aeruginosa NOT DETECTED NOT DETECTED Final   Candida albicans NOT DETECTED NOT DETECTED Final   Candida glabrata NOT DETECTED NOT DETECTED Final   Candida krusei NOT DETECTED NOT DETECTED Final   Candida parapsilosis NOT DETECTED NOT DETECTED Final   Candida tropicalis NOT DETECTED NOT DETECTED Final    Radiology: No results found.  Medications:   . allopurinol  300 mg Oral QHS  . antiseptic oral  rinse   7 mL Mouth Rinse BID  . atorvastatin  10 mg Oral Daily  . calcium acetate  2,001 mg Oral TID WC  . carvedilol  3.125 mg Oral BID WC  . cinacalcet  60 mg Oral Q breakfast  . clonazePAM  1 mg Oral BID  . colchicine  0.3 mg Oral Once per day on Mon Thu  . darbepoetin (ARANESP) injection - DIALYSIS  100 mcg Intravenous Q Sat-HD  . doxercalciferol  7 mcg Intravenous Q T,Th,Sa-HD  . gabapentin  300 mg Oral QHS  . insulin aspart  0-15 Units Subcutaneous TID WC  . insulin aspart  0-5 Units Subcutaneous QHS  . insulin aspart  4 Units Subcutaneous TID WC  . insulin glargine  35 Units Subcutaneous QHS  . lanthanum  1,000 mg Oral TID WC  . multivitamin  1 tablet Oral QHS  . pantoprazole  40 mg Oral Daily  . sodium chloride flush  3 mL Intravenous Q12H   Continuous Infusions:   Time spent: 25 minutes.   LOS: 5 days   Javares Kaufhold  [facility] Pager [phone]. If unable to reach me by pager, please call my cell phone at [phone].  *Please refer to [email or web address], password [email or web address] to get updated schedule on who will round on this patient, as hospitalists switch teams weekly. If 7PM-7AM, please contact night-coverage at [email or web address], password [email or web address] for any overnight needs.  03/31/2016, 7:40 AM

## 2016-03-31 NOTE — Anesthesia Preprocedure Evaluation (Signed)
Anesthesia Evaluation  Patient identified by MRN, date of birth, ID band Patient awake    Reviewed: Allergy & Precautions, NPO status , Patient's Chart, lab work & pertinent test results  Airway Mallampati: II  TM Distance: >3 FB Neck ROM: Full    Dental  (+) Teeth Intact, Dental Advisory Given   Pulmonary sleep apnea and Continuous Positive Airway Pressure Ventilation ,    breath sounds clear to auscultation       Cardiovascular hypertension, Pt. on medications  Rhythm:Regular Rate:Normal     Neuro/Psych    GI/Hepatic GERD  Medicated and Controlled,  Endo/Other  diabetes, Well Controlled, Type 2, Insulin DependentMorbid obesity  Renal/GU ESRF and DialysisRenal disease     Musculoskeletal   Abdominal   Peds  Hematology   Anesthesia Other Findings   Reproductive/Obstetrics                             Anesthesia Physical Anesthesia Plan  ASA: IV  Anesthesia Plan: MAC   Post-op Pain Management:    Induction: Intravenous  Airway Management Planned: Simple Face Mask  Additional Equipment:   Intra-op Plan:   Post-operative Plan:   Informed Consent: I have reviewed the patients History and Physical, chart, labs and discussed the procedure including the risks, benefits and alternatives for the proposed anesthesia with the patient or authorized representative who has indicated his/her understanding and acceptance.   Dental advisory given  Plan Discussed with: CRNA, Anesthesiologist and Surgeon  Anesthesia Plan Comments:         Anesthesia Quick Evaluation

## 2016-03-31 NOTE — Procedures (Signed)
Placed patient on CPAP for the night.  Patient is tolerating well at this time. 

## 2016-03-31 NOTE — Progress Notes (Signed)
ANTICOAGULATION CONSULT NOTE Pharmacy Consult for Warfarin Indication: hx DVT  Allergies  Allergen Reactions  . Aspirin Other (See Comments)    Reaction:  GI upset     Patient Measurements: Height: 5\' 11"  (180.3 cm) Weight: (!) 404 lb 4.8 oz (183.389 kg) IBW/kg (Calculated) : 75.3  Vital Signs: Temp: 98.7 F (37.1 C) (06/27 1430) Temp Source: Oral (06/27 0920) BP: 123/65 mmHg (06/27 0920) Pulse Rate: 98 (06/27 1430)  Labs:  Recent Labs  03/29/16 0444 03/30/16 0906 03/31/16 1250  HGB 8.7*  --  10.5*  HCT 28.7*  --  31.0*  PLT 193  --   --   LABPROT 17.7*  --   --   INR 1.44  --   --   CREATININE 6.82* 9.94*  --     Estimated Creatinine Clearance: 15.1 mL/min (by C-G formula based on Cr of 9.94).   Assessment: 50 year old resuming Coumadin this evening post catheter placement  Goal of Therapy:  INR 2-3 Monitor platelets by anticoagulation protocol: Yes   Plan:  - Warfarin 10mg  PO x 1 tonight - Daily INR  Thank you Okey RegalLisa Alzena Gerber, PharmD [phone] 03/31/2016,2:34 PM

## 2016-04-01 ENCOUNTER — Encounter (HOSPITAL_COMMUNITY): Payer: Self-pay | Admitting: Vascular Surgery

## 2016-04-01 DIAGNOSIS — R7881 Bacteremia: Secondary | ICD-10-CM

## 2016-04-01 DIAGNOSIS — Z7901 Long term (current) use of anticoagulants: Secondary | ICD-10-CM

## 2016-04-01 LAB — GLUCOSE, CAPILLARY
GLUCOSE-CAPILLARY: 145 mg/dL — AB (ref 65–99)
GLUCOSE-CAPILLARY: 163 mg/dL — AB (ref 65–99)

## 2016-04-01 MED ORDER — POLYETHYLENE GLYCOL 3350 17 G PO PACK: 17.0000 g | PACK | Freq: Every day | ORAL | Status: DC | PRN

## 2016-04-01 MED ORDER — GABAPENTIN 300 MG PO CAPS: 300.0000 mg | ORAL_CAPSULE | Freq: Every day | ORAL | Status: DC

## 2016-04-01 MED ORDER — CLONAZEPAM 1 MG PO TABS: 0.5000 mg | ORAL_TABLET | Freq: Two times a day (BID) | ORAL | Status: DC | PRN

## 2016-04-01 MED ORDER — CEFAZOLIN SODIUM-DEXTROSE 2-4 GM/100ML-% IV SOLN: 2.0000 g | INTRAVENOUS | Status: DC

## 2016-04-01 MED ORDER — WARFARIN SODIUM 10 MG PO TABS: 10.0000 mg | ORAL_TABLET | Freq: Every day | ORAL | Status: DC
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
## 2016-04-01 NOTE — Care Management Note (Signed)
Case Management Note  Patient Details  Name: Sherwood D Gartner MRN: [id number] Date of Birth: 08/17/1966  Subjective/Danise MinaObjective:           CM following for progression and d/c planning.          Action/Plan: 04/01/2016 Bariatric wheelchair and shower chair in room for pt to take home. After much research per [facility], [place]erm[place], this pt will need another sleep study prior to being able to receive a CPAP. Will attempt to schedule if ordered by MD.   Expected Discharge Date:                  Expected Discharge Plan:  Home w Home Health Services  In-House Referral:  NA  Discharge planning Services  CM Consult  Post Acute Care Choice:  Durable Medical Equipment Choice offered to:  Patient  DME Arranged:  Wheelchair manual, Shower stool DME Agency:  [facility]  HH Arranged:  [facility] Agency:     Status of Service:  In process, will continue to follow  If discussed at Long Length of Stay Meetings, dates discussed:    Additional Comments:  Starlyn SkeansRoyal, Mckenzi Buonomo U, RN 04/01/2016, 11:16 AM

## 2016-04-01 NOTE — Progress Notes (Signed)
ANTICOAGULATION CONSULT NOTE Pharmacy Consult for Warfarin Indication: line patency  Allergies  Allergen Reactions  . Aspirin Other (See Comments)    Reaction:  GI upset     Patient Measurements: Height: 5\' 11"  (180.3 cm) Weight: (!) 388 lb 4.8 oz (176.132 kg) IBW/kg (Calculated) : 75.3  Vital Signs: Temp: 98.3 F (36.8 C) (06/28 0842) Temp Source: Oral (06/28 0842) BP: 121/57 mmHg (06/28 0842) Pulse Rate: 83 (06/28 0842)  Labs:  Recent Labs  03/30/16 0906 03/31/16 1250 03/31/16 1600 03/31/16 1615  HGB  --  10.5* 8.7*  --   HCT  --  31.0* 28.4*  --   PLT  --   --  254  --   CREATININE 9.94*  --   --  13.10*    Estimated Creatinine Clearance: 11.2 mL/min (by C-G formula based on Cr of 13.1).   Assessment: 50 year old resuming Coumadin for line patency  Goal of Therapy:  INR 2-3? Monitor platelets by anticoagulation protocol: Yes   Plan:  - Continue prior to admission dose of Coumadin 10 mg po daily - Daily INR to ensure not > 3  Thank you Okey RegalLisa Khristina Janota, PharmD [phone] 04/01/2016,10:14 AM

## 2016-04-01 NOTE — Care Management Note (Signed)
Case Management Note  Patient Details  Name: Shane Hamilton MRN: [id number] Date of Birth: 07/01/1966  Subjective/Objective:                    Action/Plan:   Expected Discharge Date:                  Expected Discharge Plan:  Home w Home Health Services  In-House Referral:  NA  Discharge planning Services  CM Consult  Post Acute Care Choice:  Durable Medical Equipment Choice offered to:  Patient  DME Arranged:  Wheelchair manual, Shower stool DME Agency:  [facility]  HH Arranged:  [facility] Agency:     Status of Service:  In process, will continue to follow  If discussed at Long Length of Stay Meetings, dates discussed:    Additional Comments:  Starlyn SkeansRoyal, Darrow Barreiro U, RN 04/01/2016, 11:18 AM

## 2016-04-01 NOTE — Care Management Important Message (Signed)
Important Message  Patient Details  Name: Danise MinaReginald D Rabe MRN: [id number] Date of Birth: 05/04/1966   Medicare Important Message Given:  Yes    Mathilda Maguire, Stephan MinisterSusan Coleman 04/01/2016, 10:17 AM

## 2016-04-01 NOTE — Progress Notes (Signed)
Shane Hamilton to be D/C'd Home per MD order.  Discussed prescriptions and follow up appointments with the patient. Prescriptions given to patient, medication list explained in detail. Pt verbalized understanding.    Medication List    TAKE these medications        allopurinol 300 MG tablet  Commonly known as:  ZYLOPRIM  Take 300 mg by mouth 2 (two) times daily.     atorvastatin 10 MG tablet  Commonly known as:  LIPITOR  Take 10 mg by mouth daily.     BD PEN NEEDLE NANO U/F 32G X 4 MM Misc  Generic drug:  Insulin Pen Needle     carvedilol 3.125 MG tablet  Commonly known as:  COREG  Take 1 tablet (3.125 mg total) by mouth 2 (two) times daily with a meal.     ceFAZolin 2-4 GM/100ML-% IVPB  Commonly known as:  ANCEF  Inject 100 mLs (2 g total) into the vein Every Tuesday,Thursday,and Saturday with dialysis. For 2 weeks, till July 11  Start taking on:  04/02/2016     cinacalcet 60 MG tablet  Commonly known as:  SENSIPAR  Take 60 mg by mouth daily.     clonazePAM 1 MG tablet  Commonly known as:  KLONOPIN  Take 0.5 tablets (0.5 mg total) by mouth 2 (two) times daily as needed for anxiety.     colchicine 0.6 MG tablet  Take 0.6 mg by mouth 3 (three) times daily.     gabapentin 300 MG capsule  Commonly known as:  NEURONTIN  Take 1 capsule (300 mg total) by mouth at bedtime.     insulin glargine 100 UNIT/ML injection  Commonly known as:  LANTUS  Inject 0.35 mLs (35 Units total) into the skin at bedtime.     lanthanum 1000 MG chewable tablet  Commonly known as:  FOSRENOL  Chew 1,000 mg by mouth 3 (three) times daily with meals.     multivitamin Tabs tablet  Take 1 tablet by mouth daily.     omeprazole 20 MG capsule  Commonly known as:  PRILOSEC  Take 20 mg by mouth 2 (two) times daily before a meal.     Oxycodone HCl 10 MG Tabs  Take 1 tablet (10 mg total) by mouth 2 (two) times daily as needed (pain).     PHOSLO 667 MG capsule  Generic drug:  calcium acetate   Take 2,001 mg by mouth 3 (three) times daily with meals.     polyethylene glycol packet  Commonly known as:  MIRALAX / GLYCOLAX  Take 17 g by mouth daily as needed for mild constipation.     warfarin 10 MG tablet  Commonly known as:  COUMADIN  Take 10 mg by mouth daily.        Filed Vitals:   04/01/16 0539 04/01/16 0842  BP: 157/121 121/57  Pulse: 100 83  Temp: 98.2 F (36.8 C) 98.3 F (36.8 C)  Resp: 20 20    Skin clean, dry and intact without evidence of skin break down, no evidence of skin tears noted. IV catheter discontinued intact. Site without signs and symptoms of complications. Dressing and pressure applied. Pt denies pain at this time. No complaints noted.  Disposable pieces of CPAP machine sent home with pt per case worker.   An After Visit Summary was printed and given to the patient. Patient escorted via WC, and D/C home via private auto.  Mariann BarterKellie Dorse Locy BSN, RN [facility] Phone [phone]

## 2016-04-01 NOTE — Progress Notes (Signed)
[facility]    Date of Admission:  03/26/2016   Total days of antibiotics 7        Day 7 cefazolin thru HD           ID: Shane Hamilton is a 50 y.o. male with  Hd line associated MSSA bacteremia Principal Problem:   Sepsis (HCC) Active Problems:   Obstructive sleep apnea   DVT (deep venous thrombosis) (HCC)   Chronic anticoagulation   Type 2 diabetes mellitus, uncontrolled (HCC)   Gout   Fever   End-stage renal disease on hemodialysis (HCC)   Bacteremia   NSVT (nonsustained ventricular tachycardia) (HCC)    Subjective: Afebrile, underwent femoral HD catheter placement withou difficulty  Medications:  . allopurinol  300 mg Oral QHS  . antiseptic oral rinse  7 mL Mouth Rinse BID  . atorvastatin  10 mg Oral Daily  . calcium acetate  2,001 mg Oral TID WC  . carvedilol  3.125 mg Oral BID WC  . [START ON 04/02/2016]  ceFAZolin (ANCEF) IV  2 g Intravenous Q T,Th,Sa-HD  . cinacalcet  60 mg Oral Q breakfast  . clonazePAM  1 mg Oral BID  . colchicine  0.3 mg Oral Once per day on Mon Thu  . darbepoetin (ARANESP) injection - DIALYSIS  100 mcg Intravenous Q Sat-HD  . doxercalciferol  7 mcg Intravenous Q T,Th,Sa-HD  . gabapentin  300 mg Oral QHS  . insulin aspart  0-15 Units Subcutaneous TID WC  . insulin aspart  0-5 Units Subcutaneous QHS  . insulin aspart  5 Units Subcutaneous TID WC  . insulin glargine  35 Units Subcutaneous QHS  . lanthanum  1,000 mg Oral TID WC  . multivitamin  1 tablet Oral QHS  . pantoprazole  40 mg Oral Daily  . sodium chloride flush  3 mL Intravenous Q12H  . warfarin  10 mg Oral q1800  . Warfarin - Pharmacist Dosing Inpatient   Does not apply q1800    Objective: Vital signs in last 24 hours: Temp:  [97 F (36.1 C)-98.7 F (37.1 C)] 98.3 F (36.8 C) (06/28 0842) Pulse Rate:  [83-102] 83 (06/28 0842) Resp:  [14-20] 20 (06/28 0842) BP: (85-157)/(39-121) 121/57 mmHg (06/28 0842) SpO2:  [91 %-100 %] 97 % (06/28  0842) Weight:  [325 lb (147.419 kg)-388 lb 4.8 oz (176.132 kg)] 388 lb 4.8 oz (176.132 kg) (06/28 [id number]) Physical Exam  Constitutional: sleeping. He appears well-developed and well-nourished. No distress.  HENT:  Mouth/Throat: Oropharynx is clear and moist. No oropharyngeal exudate.  Cardiovascular: Normal rate, regular rhythm and normal heart sounds. Exam reveals no gallop and no friction rub.  No murmur heard.  Pulmonary/Chest: Effort normal and breath sounds normal. No respiratory distress. He has no wheezes.  Abdominal: Soft. Bowel sounds are normal. He exhibits no distension. There is no tenderness.  Skin: Skin is warm and dry. No rash noted. No erythema.     Lab Results  Recent Labs  03/30/16 0906 03/31/16 1250 03/31/16 1600 03/31/16 1615  WBC  --   --  12.1*  --   HGB  --  10.5* 8.7*  --   HCT  --  31.0* 28.4*  --   NA 133* 133*  --  133*  K 3.7 4.2  --  4.3  CL 94*  --   --  95*  CO2 24  --   --  25  BUN 50*  --   --  71*  CREATININE 9.94*  --   --  13.10*   Liver Panel  Recent Labs  03/30/16 0906 03/31/16 1615  ALBUMIN 2.7* 2.7*    Microbiology: 6/26 blood cx ngtd 6/22 blood cx MSSA Studies/Results: Ct Tibia Fibula Left W Contrast  03/31/2016  CLINICAL DATA:  Hemodialysis patient with pain, edema and cellulitis in the lower leg. EXAM: CT OF THE LEFT TIBIA AND FIBULA WITH CONTRAST TECHNIQUE: Multi detector CT imaging of the left lower leg was performed following intravenous contrast administration. Multiplanar reformatted images were generated. CONTRAST:  100mL ISOVUE-300 IOPAMIDOL (ISOVUE-300) INJECTION 61% COMPARISON:  Doppler ultrasound 06/19/2015 FINDINGS: Bones: There is no evidence of acute fracture, dislocation or bone destruction. There is serpiginous subchondral sclerosis in both femoral condyles consistent with bone infarcts. There are probable bone infarct in the distal tibia as well. Joint/cartilage: There are tricompartmental degenerative changes at  the left knee, greatest within the medial and patellofemoral compartments. No large joint effusion. No significant ankle arthropathy. Ligaments: Not applicable for exam/indication. Tendons/muscles: There is generalized lower leg muscular fatty atrophy, especially in the posterior compartment. The ankle tendons appear intact. Neurovascular/other soft tissues: There is mild-to-moderate subcutaneous edema throughout the lower leg, greatest distally. There is some pretibial ill-defined fluid and small calcifications. No focal fluid collections are identified. There is no evidence of foreign body. There are small superficial varicosities medially. Opacification of the deep veins is suboptimal, although no acute vascular findings are seen. IMPRESSION: 1. Nonspecific lower leg edema consistent with given history of cellulitis. Similar findings can be seen with venous insufficiency, lymphedema and volume overload. 2. No evidence of soft tissue abscess or osteomyelitis. 3. 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 4. Lower leg muscular atrophy. Electronically Signed   By: Carey BullocksWilliam  Veazey M.D.   On: 03/31/2016 10:13   Dg Chest Port 1 View  03/31/2016  CLINICAL DATA:  Central catheter placement EXAM: CHEST - - 1 VIEW; INTRAOP FLUOROSCOPY (NO IMAGES) COMPARISON:  March 26, 2016 FINDINGS: Prior left subclavian catheter has been removed. There is a catheter placed from the groin region with the tip in the right atrium. No pneumothorax. There is no edema or consolidation. Heart is borderline prominent with pulmonary vascularity within normal limits. No adenopathy. No bone lesions. IMPRESSION: Central catheter tip in right atrium. No pneumothorax. Stable cardiac prominence. No edema or consolidation. Electronically Signed   By: Bretta BangWilliam  Woodruff III M.D.   On: 03/31/2016 15:48   Dg Fluoro Guide Cv Line-no Report  03/31/2016  CLINICAL DATA:  FLOURO GUIDE CV LINE Fluoroscopy was utilized by the requesting physician.   No radiographic interpretation.     Assessment/Plan: mssa bacteremia = plan to treat for complicated bacteremia with 4 wk of cefazolin given after HD, since we were unable to rule out endocarditis. He had his HD line removed with appropriate line holiday. Had new femoral line (due to limited access) for HD. His habitus precluded getting TEE. He had cleared his bacteremia as expected.   - will use June 26th as day 1 - end date is July 24th   [name], [facility] Cell: [phone] Pager: [phone]  04/01/2016, 3:50 PM

## 2016-04-01 NOTE — Discharge Summary (Addendum)
Physician Discharge Summary  Shane Hamilton Nadal WGN:[id number] DOB: 03/21/1966 DOA: 03/26/2016  PCP: Darrow BussingKOIRALA,DIBAS, MD  Admit date: 03/26/2016 Discharge date: 04/01/2016  Time spent: 45 minutes  Recommendations for Outpatient Follow-up:  1. Continue IV Ancef with HD for 4weeks, until 7/24 2. Sleep study requested and CPAP to be set up with Home Health agency 3. Wean down Oxycodone as tolerated 4. Check INR on Thursday 6/29 at HD   Discharge Diagnoses:  Principal Problem:   Sepsis (HCC)   MSSA Bacteremia   Obstructive sleep apnea   DVT (deep venous thrombosis) (HCC)   Chronic anticoagulation   Type 2 diabetes mellitus, uncontrolled (HCC)   Gout   Fever   End-stage renal disease on hemodialysis (HCC)   Bacteremia   NSVT (nonsustained ventricular tachycardia) (HCC)   R LE cellulitis  Discharge Condition: stable  Diet recommendation: Renal Diabetic  Filed Weights   03/29/16 2051 03/31/16 1925 04/01/16 0842  Weight: 183.389 kg (404 lb 4.8 oz) 147.419 kg (325 lb) 176.132 kg (388 lb 4.8 oz)    History of present illness:  Chief Complaint: fever HPI: Shane Hamilton Herling is a 50 y.o. 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 Patient was hospitalized late last month with DKA and respiratory failure secondary to decompensated diastolic heart failure and cor pulmonale related to OSA Patient brought to the ED today from dialysis center. Upon arriving to the dialysis center patient was found to be febrile with temperature 101 and chills  Hospital Course:  Sepsis (HCC) secondary to MSSA bacteremia Admitted and blood cultures obtained. Initially placed on broad-spectrum antibiotics with Zosyn and vancomycin. Antibiotics narrowed to Ancef secondary to MSSA + blood cultures. ID consulted - Source thought to be dialysis catheter which is removed but the patient also has  left calf erythema, redness and swelling concerning for cellulitis ,  CT ruled out deeper infection -repeat Blood Cx negative -seen by ID Dr.Snider, i Hamilton/w Dr.Snider today who recommended 4 weeks of IV Ancef till 7/24 -new HD access placed by [facility] yesterday   End-stage renal disease on hemodialysis (HCC) HD per nephrology. Continue PhosLo, Hectorol and Sensipar. Dialysis catheter removed, IR had difficulty trying to place new dialysis catheter. Seen by vascular surgeon and s/p R femoral tunneled HD cath placed   Left leg cellulitis -CT with soft tissue swelling, dopplers negative for DVT -continue Ancef as above   Nonsustained V. Tach Patient noted to have a 12 beat run of ventricular tachycardia 03/29/16 at 5:30 AM. Magnesium OK. -continue Coreg, no further episodes   Obstructive sleep apnea CPAP ordered daily at bedtime. -another sleep study requested, CM to set up Sleepo study and CPAP machine prior to discharge -needs Oxycodone cut down, i have cut down his klonopin dose and Gabapentin dose  H/o PE and Chronic anticoagulation for HD access Held Coumadin for of surgery for removing tunneled IJ catheter - Resumed Coumadin post procedure after vascular access reestablished -Needs INR check on Thursday   Type 2 diabetes mellitus, uncontrolled (HCC) /diabetic neuropathy -Stable, continue Lantus 35 units daily. CBGs 133-267 - Continue Neurontin for neuropathy, dose cut down to 300mg QHS from 1200mg  due to ESRD   Gout Continue allopurinol.  Procedures: 6/27: Placement of 55 cm right femoral tunneled dialysis catheter by Dr.DIckson [facility]  Consultations:  Renal   ID  [facility]  IR  Discharge Exam: Filed Vitals:   04/01/16 0539 04/01/16 0842  BP:  157/121 121/57  Pulse: 100 83  Temp: 98.2 F (36.8 C) 98.3 F (36.8 C)  Resp: 20 20    General: AAOx3 Cardiovascular: S1S2/RRR Respiratory: CTAB  Discharge Instructions   Discharge Instructions    Discharge instructions     Complete by:  As directed   Renal Diabetic diet     Increase activity slowly    Complete by:  As directed           Current Discharge Medication List    START taking these medications   Details  ceFAZolin (ANCEF) 2-4 GM/100ML-% IVPB Inject 100 mLs (2 g total) into the vein Every Tuesday,Thursday,and Saturday with dialysis. For 2 weeks, till July 24 Qty: 5 each, Refills: 0    polyethylene glycol (MIRALAX / GLYCOLAX) packet Take 17 g by mouth daily as needed for mild constipation. Qty: 14 each, Refills: 0      CONTINUE these medications which have CHANGED   Details  clonazePAM (KLONOPIN) 1 MG tablet Take 0.5 tablets (0.5 mg total) by mouth 2 (two) times daily as needed for anxiety. Qty: 30 tablet, Refills: 0    gabapentin (NEURONTIN) 300 MG capsule Take 1 capsule (300 mg total) by mouth at bedtime.      CONTINUE these medications which have NOT CHANGED   Details  allopurinol (ZYLOPRIM) 300 MG tablet Take 300 mg by mouth 2 (two) times daily.    atorvastatin (LIPITOR) 10 MG tablet Take 10 mg by mouth daily.    calcium acetate (PHOSLO) 667 MG capsule Take 2,001 mg by mouth 3 (three) times daily with meals.     carvedilol (COREG) 3.125 MG tablet Take 1 tablet (3.125 mg total) by mouth 2 (two) times daily with a meal. Qty: 60 tablet, Refills: 0    cinacalcet (SENSIPAR) 60 MG tablet Take 60 mg by mouth daily.    colchicine 0.6 MG tablet Take 0.6 mg by mouth 3 (three) times daily.     insulin glargine (LANTUS) 100 UNIT/ML injection Inject 0.35 mLs (35 Units total) into the skin at bedtime. Qty: 10 mL, Refills: 11    lanthanum (FOSRENOL) 1000 MG chewable tablet Chew 1,000 mg by mouth 3 (three) times daily with meals.     multivitamin (RENA-VIT) TABS tablet Take 1 tablet by mouth daily.  Refills: 3    omeprazole (PRILOSEC) 20 MG capsule Take 20 mg by mouth 2 (two) times daily before a meal.     Oxycodone HCl 10 MG TABS Take 1 tablet (10 mg total) by mouth 2 (two) times  daily as needed (pain). Qty: 20 tablet, Refills: 0    warfarin (COUMADIN) 10 MG tablet Take 10 mg by mouth daily.    BD PEN NEEDLE NANO U/F 32G X 4 MM MISC        Allergies  Allergen Reactions  . Aspirin Other (See Comments)    Reaction:  GI upset    Follow-up Information    Follow up with Darrow Bussing, MD In 1 week.   Specialty:  Family Medicine   Contact information:   [address] [place] [address] [phone]        The results of significant diagnostics from this hospitalization (including imaging, microbiology, ancillary and laboratory) are listed below for reference.    Significant Diagnostic Studies: Dg Chest 2 View  03/04/2016  CLINICAL DATA:  Hypoxia. EXAM: CHEST  2 VIEW COMPARISON:  03/01/2016. FINDINGS: Dialysis catheter noted with tip projected over the lower right atrium. Cardiomegaly with the  bilateral pulmonary infiltrates most likely secondary to pulmonary edema. No pleural effusion or pneumothorax . IMPRESSION: 1. Dialysis catheter noted with tip projected over the lower right atrium. 2. Cardiomegaly with bilateral pulmonary infiltrates consistent pulmonary edema. Electronically Signed   By: Maisie Fus  Register   On: 03/04/2016 10:35   Ct Tibia Fibula Left W Contrast  03/31/2016  CLINICAL DATA:  Hemodialysis patient with pain, edema and cellulitis in the lower leg. EXAM: CT OF THE LEFT TIBIA AND FIBULA WITH CONTRAST TECHNIQUE: Multi detector CT imaging of the left lower leg was performed following intravenous contrast administration. Multiplanar reformatted images were generated. CONTRAST:  ISOVUE-300 IOPAMIDOL (ISOVUE-300) INJECTION 61% COMPARISON:  Doppler ultrasound 06/19/2015 FINDINGS: Bones: There is no evidence of acute fracture, dislocation or bone destruction. There is serpiginous subchondral sclerosis in both femoral condyles consistent with bone infarcts. There are probable bone infarct in the distal tibia as well.  Joint/cartilage: There are tricompartmental degenerative changes at the left knee, greatest within the medial and patellofemoral compartments. No large joint effusion. No significant ankle arthropathy. Ligaments: Not applicable for exam/indication. Tendons/muscles: There is generalized lower leg muscular fatty atrophy, especially in the posterior compartment. The ankle tendons appear intact. Neurovascular/other soft tissues: There is mild-to-moderate subcutaneous edema throughout the lower leg, greatest distally. There is some pretibial ill-defined fluid and small calcifications. No focal fluid collections are identified. There is no evidence of foreign body. There are small superficial varicosities medially. Opacification of the deep veins is suboptimal, although no acute vascular findings are seen. IMPRESSION: 1. Nonspecific lower leg edema consistent with given history of cellulitis. Similar findings can be seen with venous insufficiency, lymphedema and volume overload. 2. No evidence of soft tissue abscess or osteomyelitis. 3. 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 4. Lower leg muscular atrophy. Electronically Signed   By: Carey Bullocks M.Hamilton.   On: 03/31/2016 10:13   Ir Removal Tun Cv Cath W/o Fl  03/28/2016  CLINICAL DATA:  MRSA bacteremia and need to remove tunneled left-sided hemodialysis catheter. EXAM: REMOVAL OF TUNNELED CENTRAL VENOUS CATHETER PROCEDURE: The left chest dialysis catheter site was prepped with chlorhexidine. A sterile gown and gloves were worn during the procedure. Local anesthesia was provided with 1% lidocaine. Utilizing blunt dissection, the subcutaneous cuff of the dialysis catheter was freed. The catheter was then successfully removed in its entirety. A sterile dressing was applied over the catheter exit site. IMPRESSION: Removal of tunneled dialysis catheter utilizing blunt dissection. The procedure was uncomplicated. Electronically Signed   By: Irish Lack M.Hamilton.    On: 03/28/2016 15:42   Dg Chest Port 1 View  03/31/2016  CLINICAL DATA:  Central catheter placement EXAM: CHEST - - 1 VIEW; INTRAOP FLUOROSCOPY (NO IMAGES) COMPARISON:  March 26, 2016 FINDINGS: Prior left subclavian catheter has been removed. There is a catheter placed from the groin region with the tip in the right atrium. No pneumothorax. There is no edema or consolidation. Heart is borderline prominent with pulmonary vascularity within normal limits. No adenopathy. No bone lesions. IMPRESSION: Central catheter tip in right atrium. No pneumothorax. Stable cardiac prominence. No edema or consolidation. Electronically Signed   By: Bretta Bang III M.Hamilton.   On: 03/31/2016 15:48   Dg Chest Port 1 View  03/26/2016  CLINICAL DATA:  Fevers EXAM: PORTABLE CHEST 1 VIEW COMPARISON:  03/04/2016 FINDINGS: There is a left IJ catheter with tips in the right atrium. The heart size is mildly enlarged. There is pulmonary vascular congestion present. No  airspace consolidation. IMPRESSION: Pulmonary vascular congestion. Electronically Signed   By: Signa Kell M.Hamilton.   On: 03/26/2016 14:15   Dg Chest Port 1 View  03/04/2016  CLINICAL DATA:  Endotracheal tube placement. EXAM: PORTABLE CHEST 1 VIEW COMPARISON:  Same day. FINDINGS: Stable cardiomegaly. Nasogastric tube tip is seen in proximal stomach. Left internal jugular catheter is unchanged in position with tip projected over right atrium. Endotracheal tube is projected over tracheal air shadow with distal tip 4.5 cm above the carina. Hypoinflation of the lungs is noted. No pneumothorax or pleural effusion is noted. No acute pulmonary disease is noted. IMPRESSION: Endotracheal and nasogastric tubes are in grossly good position. Left internal jugular catheter is unchanged in position with tip projected over right atrium. Hypoinflation the lungs is noted without definite acute pulmonary disease. Electronically Signed   By: Lupita Raider, M.Hamilton.   On: 03/04/2016 13:36    Dg Abd Portable 1v  03/04/2016  CLINICAL DATA:  Nasogastric tube placement EXAM: PORTABLE ABDOMEN - 1 VIEW COMPARISON:  None. FINDINGS: Nasogastric tube tip and side port are in the stomach. Bowel gas pattern is unremarkable. There is consolidation in the medial left lung base. No free air evident. IMPRESSION: Nasogastric tube tip and side port in stomach. Bowel gas pattern unremarkable. Consolidation left base. Electronically Signed   By: Bretta Bang III M.Hamilton.   On: 03/04/2016 13:34   Dg Fluoro Guide Cv Line-no Report  03/31/2016  CLINICAL DATA:  FLOURO GUIDE CV LINE Fluoroscopy was utilized by the requesting physician.  No radiographic interpretation.    Microbiology: Recent Results (from the past 240 hour(s))  Blood Culture (routine x 2)     Status: Abnormal   Collection Time: 03/26/16 12:37 PM  Result Value Ref Range Status   Specimen Description BLOOD LEFT ANTECUBITAL  Final   Special Requests   Final    BOTTLES DRAWN AEROBIC AND ANAEROBIC 10CC AER 5CC ANA   Culture  Setup Time   Final    GRAM POSITIVE COCCI IN CLUSTERS AEROBIC BOTTLE ONLY CRITICAL RESULT CALLED TO, READ BACK BY AND VERIFIED WITH: M BELL,PHARMD AT 1610 03/28/16 BY L BENFIELD     Culture (A)  Final    STAPHYLOCOCCUS AUREUS SUSCEPTIBILITIES PERFORMED ON PREVIOUS CULTURE WITHIN THE LAST 5 DAYS.    Report Status 03/29/2016 FINAL  Final  Blood Culture (routine x 2)     Status: Abnormal   Collection Time: 03/26/16  1:07 PM  Result Value Ref Range Status   Specimen Description BLOOD RIGHT ARM  Final   Special Requests BOTTLES DRAWN AEROBIC AND ANAEROBIC 5CC  Final   Culture  Setup Time   Final    GRAM POSITIVE COCCI IN CLUSTERS IN BOTH AEROBIC AND ANAEROBIC BOTTLES CRITICAL RESULT CALLED TO, READ BACK BY AND VERIFIED WITH: CDelsa Sale Hamilton AT 0840 ON [id number] BY S. YARBROUGH    Culture STAPHYLOCOCCUS AUREUS (A)  Final   Report Status 03/29/2016 FINAL  Final   Organism ID, Bacteria STAPHYLOCOCCUS AUREUS   Final      Susceptibility   Staphylococcus aureus - MIC*    CIPROFLOXACIN <=0.5 SENSITIVE Sensitive     ERYTHROMYCIN <=0.25 SENSITIVE Sensitive     GENTAMICIN <=0.5 SENSITIVE Sensitive     OXACILLIN 0.5 SENSITIVE Sensitive     TETRACYCLINE 8 INTERMEDIATE Intermediate     VANCOMYCIN <=0.5 SENSITIVE Sensitive     TRIMETH/SULFA <=10 SENSITIVE Sensitive     CLINDAMYCIN <=0.25 SENSITIVE Sensitive     RIFAMPIN <=  0.5 SENSITIVE Sensitive     Inducible Clindamycin NEGATIVE Sensitive     * STAPHYLOCOCCUS AUREUS  Blood Culture ID Panel (Reflexed)     Status: Abnormal   Collection Time: 03/26/16  1:07 PM  Result Value Ref Range Status   Enterococcus species NOT DETECTED NOT DETECTED Final   Vancomycin resistance NOT DETECTED NOT DETECTED Final   Listeria monocytogenes NOT DETECTED NOT DETECTED Final   Staphylococcus species DETECTED (A) NOT DETECTED Final    Comment: CRITICAL RESULT CALLED TO, READ BACK BY AND VERIFIED WITH: C. STEWART, PHARM Hamilton AT 0840 ON [id number] BY S. YARBROUGH    Staphylococcus aureus DETECTED (A) NOT DETECTED Final    Comment: CRITICAL RESULT CALLED TO, READ BACK BY AND VERIFIED WITH: C. STEWART, PHARM Hamilton AT 0840 ON [phone] BY S. YARBROUGH    Methicillin resistance NOT DETECTED NOT DETECTED Final   Streptococcus species NOT DETECTED NOT DETECTED Final   Streptococcus agalactiae NOT DETECTED NOT DETECTED Final   Streptococcus pneumoniae NOT DETECTED NOT DETECTED Final   Streptococcus pyogenes NOT DETECTED NOT DETECTED Final   Acinetobacter baumannii NOT DETECTED NOT DETECTED Final   Enterobacteriaceae species NOT DETECTED NOT DETECTED Final   Enterobacter cloacae complex NOT DETECTED NOT DETECTED Final   Escherichia coli NOT DETECTED NOT DETECTED Final   Klebsiella oxytoca NOT DETECTED NOT DETECTED Final   Klebsiella pneumoniae NOT DETECTED NOT DETECTED Final   Proteus species NOT DETECTED NOT DETECTED Final   Serratia marcescens NOT DETECTED NOT DETECTED Final    Carbapenem resistance NOT DETECTED NOT DETECTED Final   Haemophilus influenzae NOT DETECTED NOT DETECTED Final   Neisseria meningitidis NOT DETECTED NOT DETECTED Final   Pseudomonas aeruginosa NOT DETECTED NOT DETECTED Final   Candida albicans NOT DETECTED NOT DETECTED Final   Candida glabrata NOT DETECTED NOT DETECTED Final   Candida krusei NOT DETECTED NOT DETECTED Final   Candida parapsilosis NOT DETECTED NOT DETECTED Final   Candida tropicalis NOT DETECTED NOT DETECTED Final  Culture, blood (routine x 2)     Status: None (Preliminary result)   Collection Time: 03/30/16  9:29 PM  Result Value Ref Range Status   Specimen Description BLOOD LEFT ARM  Final   Special Requests BOTTLES DRAWN AEROBIC AND ANAEROBIC 5 CC   Final   Culture NO GROWTH 2 DAYS  Final   Report Status PENDING  Incomplete  Culture, blood (routine x 2)     Status: None (Preliminary result)   Collection Time: 03/30/16  9:35 PM  Result Value Ref Range Status   Specimen Description BLOOD RIGHT ANTECUBITAL  Final   Special Requests BOTTLES DRAWN AEROBIC AND ANAEROBIC 5 CC   Final   Culture NO GROWTH 2 DAYS  Final   Report Status PENDING  Incomplete     Labs: Basic Metabolic Panel:  Recent Labs Lab 03/27/16 0315 03/28/16 0546 03/29/16 0444 03/29/16 0919 03/30/16 0906 03/31/16 1250 03/31/16 1615  NA 135 131* 132*  --  133* 133* 133*  K 4.4 3.8 3.7  --  3.7 4.2 4.3  CL 99* 93* 96*  --  94*  --  95*  CO2 29 26 26   --  24  --  25  GLUCOSE 203* 243* 189*  --  209* 144* 148*  BUN 26* 27* 27*  --  50*  --  71*  CREATININE 8.05* 6.88* 6.82*  --  9.94*  --  13.10*  CALCIUM 8.5* 8.2* 8.2*  --  8.1*  --  8.2*  MG  --   --   --  1.9  --   --   --   PHOS  --  3.6 4.3  --  4.7*  --  6.2*   Liver Function Tests:  Recent Labs Lab 03/26/16 1235 03/28/16 0546 03/29/16 0444 03/30/16 0906 03/31/16 1615  AST 27  --   --   --   --   ALT 24  --   --   --   --   ALKPHOS 138*  --   --   --   --   BILITOT 0.9  --   --    --   --   PROT 8.4*  --   --   --   --   ALBUMIN 3.7 2.9* 2.8* 2.7* 2.7*   No results for input(s): LIPASE, AMYLASE in the last 168 hours. No results for input(s): AMMONIA in the last 168 hours. CBC:  Recent Labs Lab 03/26/16 1235 03/27/16 0315 03/29/16 0444 03/31/16 1250 03/31/16 1600  WBC 17.2* 13.5* 10.5  --  12.1*  NEUTROABS 14.0*  --   --   --   --   HGB 10.5* 9.5* 8.7* 10.5* 8.7*  HCT 34.3* 32.0* 28.7* 31.0* 28.4*  MCV 102.1* 103.9* 102.1*  --  102.2*  PLT 226 205 193  --  254   Cardiac Enzymes: No results for input(s): CKTOTAL, CKMB, CKMBINDEX, TROPONINI in the last 168 hours. BNP: BNP (last 3 results) No results for input(s): BNP in the last 8760 hours.  ProBNP (last 3 results) No results for input(s): PROBNP in the last 8760 hours.  CBG:  Recent Labs Lab 03/31/16 1210 03/31/16 1429 03/31/16 2213 04/01/16 0800 04/01/16 1227  GLUCAP 139* 131* 181* 145* 163*       Signed:  Karma Ansley MD.  [facility] 04/01/2016, 1:35 PM

## 2016-04-02 DIAGNOSIS — D689 Coagulation defect, unspecified: Secondary | ICD-10-CM | POA: Diagnosis not present

## 2016-04-02 DIAGNOSIS — N186 End stage renal disease: Secondary | ICD-10-CM | POA: Diagnosis not present

## 2016-04-02 DIAGNOSIS — D631 Anemia in chronic kidney disease: Secondary | ICD-10-CM | POA: Diagnosis not present

## 2016-04-02 DIAGNOSIS — E1129 Type 2 diabetes mellitus with other diabetic kidney complication: Secondary | ICD-10-CM | POA: Diagnosis not present

## 2016-04-02 DIAGNOSIS — I2782 Chronic pulmonary embolism: Secondary | ICD-10-CM | POA: Diagnosis not present

## 2016-04-02 DIAGNOSIS — T814XXS Infection following a procedure, sequela: Secondary | ICD-10-CM | POA: Diagnosis not present

## 2016-04-02 DIAGNOSIS — D509 Iron deficiency anemia, unspecified: Secondary | ICD-10-CM | POA: Diagnosis not present

## 2016-04-02 NOTE — Anesthesia Postprocedure Evaluation (Signed)
Anesthesia Post Note  Patient: Danise MinaReginald D Lagman  Procedure(s) Performed: Procedure(s) (LRB): INSERTION OF DIALYSIS CATHETER RIGHT FEMORAL  (Right)  Patient location during evaluation: PACU Anesthesia Type: General Level of consciousness: awake and alert Pain management: pain level controlled Vital Signs Assessment: post-procedure vital signs reviewed and stable Respiratory status: spontaneous breathing, nonlabored ventilation and respiratory function stable Cardiovascular status: blood pressure returned to baseline and stable Postop Assessment: no signs of nausea or vomiting Anesthetic complications: no    Last Vitals:  Filed Vitals:   04/01/16 0539 04/01/16 0842  BP: 157/121 121/57  Pulse: 100 83  Temp: 36.8 C 36.8 C  Resp: 20 20    Last Pain:  Filed Vitals:   04/01/16 1425  PainSc: 0-No pain                 Deondra Wigger A

## 2016-04-03 DIAGNOSIS — N186 End stage renal disease: Secondary | ICD-10-CM | POA: Diagnosis not present

## 2016-04-03 DIAGNOSIS — Z992 Dependence on renal dialysis: Secondary | ICD-10-CM | POA: Diagnosis not present

## 2016-04-03 DIAGNOSIS — E1129 Type 2 diabetes mellitus with other diabetic kidney complication: Secondary | ICD-10-CM | POA: Diagnosis not present

## 2016-04-04 DIAGNOSIS — E1129 Type 2 diabetes mellitus with other diabetic kidney complication: Secondary | ICD-10-CM | POA: Diagnosis not present

## 2016-04-04 DIAGNOSIS — N186 End stage renal disease: Secondary | ICD-10-CM | POA: Diagnosis not present

## 2016-04-04 DIAGNOSIS — T8249XD Other complication of vascular dialysis catheter, subsequent encounter: Secondary | ICD-10-CM | POA: Diagnosis not present

## 2016-04-04 DIAGNOSIS — D689 Coagulation defect, unspecified: Secondary | ICD-10-CM | POA: Diagnosis not present

## 2016-04-04 DIAGNOSIS — D509 Iron deficiency anemia, unspecified: Secondary | ICD-10-CM | POA: Diagnosis not present

## 2016-04-04 DIAGNOSIS — T82898D Other specified complication of vascular prosthetic devices, implants and grafts, subsequent encounter: Secondary | ICD-10-CM | POA: Diagnosis not present

## 2016-04-04 DIAGNOSIS — D631 Anemia in chronic kidney disease: Secondary | ICD-10-CM | POA: Diagnosis not present

## 2016-04-04 DIAGNOSIS — T814XXS Infection following a procedure, sequela: Secondary | ICD-10-CM | POA: Diagnosis not present

## 2016-04-04 LAB — CULTURE, BLOOD (ROUTINE X 2)
Culture: NO GROWTH
Culture: NO GROWTH

## 2016-04-04 NOTE — Progress Notes (Signed)
Patient called @ 15:40 on 04-04-2016.  Patient stated that he does not have a CPAP and needed pain medicine RX.  Reviewed AVS with patient over the phone.  Instructed patient to contact [facility] to pick up CPAP, as instructed on AVS.  Patient requesting pain medication.  Informed patient that his pain medication listed on AVS was a home medication and would need to contact original prescriber.  Informed patient the discharging would not be able to write RX for pain medicine, 3 days after discharge.  Peri MarisAndrew Arnella Pralle, MBA, BSN, RN

## 2016-04-07 DIAGNOSIS — N186 End stage renal disease: Secondary | ICD-10-CM | POA: Diagnosis not present

## 2016-04-07 DIAGNOSIS — D509 Iron deficiency anemia, unspecified: Secondary | ICD-10-CM | POA: Diagnosis not present

## 2016-04-07 DIAGNOSIS — T82898D Other specified complication of vascular prosthetic devices, implants and grafts, subsequent encounter: Secondary | ICD-10-CM | POA: Diagnosis not present

## 2016-04-07 DIAGNOSIS — T814XXS Infection following a procedure, sequela: Secondary | ICD-10-CM | POA: Diagnosis not present

## 2016-04-07 DIAGNOSIS — D689 Coagulation defect, unspecified: Secondary | ICD-10-CM | POA: Diagnosis not present

## 2016-04-07 DIAGNOSIS — E1129 Type 2 diabetes mellitus with other diabetic kidney complication: Secondary | ICD-10-CM | POA: Diagnosis not present

## 2016-04-09 DIAGNOSIS — D509 Iron deficiency anemia, unspecified: Secondary | ICD-10-CM | POA: Diagnosis not present

## 2016-04-09 DIAGNOSIS — D689 Coagulation defect, unspecified: Secondary | ICD-10-CM | POA: Diagnosis not present

## 2016-04-09 DIAGNOSIS — E1129 Type 2 diabetes mellitus with other diabetic kidney complication: Secondary | ICD-10-CM | POA: Diagnosis not present

## 2016-04-09 DIAGNOSIS — N186 End stage renal disease: Secondary | ICD-10-CM | POA: Diagnosis not present

## 2016-04-09 DIAGNOSIS — T82898D Other specified complication of vascular prosthetic devices, implants and grafts, subsequent encounter: Secondary | ICD-10-CM | POA: Diagnosis not present

## 2016-04-09 DIAGNOSIS — T814XXS Infection following a procedure, sequela: Secondary | ICD-10-CM | POA: Diagnosis not present

## 2016-04-11 DIAGNOSIS — N186 End stage renal disease: Secondary | ICD-10-CM | POA: Diagnosis not present

## 2016-04-11 DIAGNOSIS — T82898D Other specified complication of vascular prosthetic devices, implants and grafts, subsequent encounter: Secondary | ICD-10-CM | POA: Diagnosis not present

## 2016-04-11 DIAGNOSIS — D689 Coagulation defect, unspecified: Secondary | ICD-10-CM | POA: Diagnosis not present

## 2016-04-11 DIAGNOSIS — D509 Iron deficiency anemia, unspecified: Secondary | ICD-10-CM | POA: Diagnosis not present

## 2016-04-11 DIAGNOSIS — E1129 Type 2 diabetes mellitus with other diabetic kidney complication: Secondary | ICD-10-CM | POA: Diagnosis not present

## 2016-04-11 DIAGNOSIS — T814XXS Infection following a procedure, sequela: Secondary | ICD-10-CM | POA: Diagnosis not present

## 2016-04-14 DIAGNOSIS — T814XXS Infection following a procedure, sequela: Secondary | ICD-10-CM | POA: Diagnosis not present

## 2016-04-14 DIAGNOSIS — N186 End stage renal disease: Secondary | ICD-10-CM | POA: Diagnosis not present

## 2016-04-14 DIAGNOSIS — E1129 Type 2 diabetes mellitus with other diabetic kidney complication: Secondary | ICD-10-CM | POA: Diagnosis not present

## 2016-04-14 DIAGNOSIS — D689 Coagulation defect, unspecified: Secondary | ICD-10-CM | POA: Diagnosis not present

## 2016-04-14 DIAGNOSIS — T82898D Other specified complication of vascular prosthetic devices, implants and grafts, subsequent encounter: Secondary | ICD-10-CM | POA: Diagnosis not present

## 2016-04-14 DIAGNOSIS — D509 Iron deficiency anemia, unspecified: Secondary | ICD-10-CM | POA: Diagnosis not present

## 2016-04-16 DIAGNOSIS — D509 Iron deficiency anemia, unspecified: Secondary | ICD-10-CM | POA: Diagnosis not present

## 2016-04-16 DIAGNOSIS — D689 Coagulation defect, unspecified: Secondary | ICD-10-CM | POA: Diagnosis not present

## 2016-04-16 DIAGNOSIS — E1129 Type 2 diabetes mellitus with other diabetic kidney complication: Secondary | ICD-10-CM | POA: Diagnosis not present

## 2016-04-16 DIAGNOSIS — T82898D Other specified complication of vascular prosthetic devices, implants and grafts, subsequent encounter: Secondary | ICD-10-CM | POA: Diagnosis not present

## 2016-04-16 DIAGNOSIS — N186 End stage renal disease: Secondary | ICD-10-CM | POA: Diagnosis not present

## 2016-04-16 DIAGNOSIS — T814XXS Infection following a procedure, sequela: Secondary | ICD-10-CM | POA: Diagnosis not present

## 2016-04-18 DIAGNOSIS — T814XXS Infection following a procedure, sequela: Secondary | ICD-10-CM | POA: Diagnosis not present

## 2016-04-18 DIAGNOSIS — D509 Iron deficiency anemia, unspecified: Secondary | ICD-10-CM | POA: Diagnosis not present

## 2016-04-18 DIAGNOSIS — D689 Coagulation defect, unspecified: Secondary | ICD-10-CM | POA: Diagnosis not present

## 2016-04-18 DIAGNOSIS — E1129 Type 2 diabetes mellitus with other diabetic kidney complication: Secondary | ICD-10-CM | POA: Diagnosis not present

## 2016-04-18 DIAGNOSIS — T82898D Other specified complication of vascular prosthetic devices, implants and grafts, subsequent encounter: Secondary | ICD-10-CM | POA: Diagnosis not present

## 2016-04-18 DIAGNOSIS — N186 End stage renal disease: Secondary | ICD-10-CM | POA: Diagnosis not present

## 2016-04-21 DIAGNOSIS — D689 Coagulation defect, unspecified: Secondary | ICD-10-CM | POA: Diagnosis not present

## 2016-04-21 DIAGNOSIS — D509 Iron deficiency anemia, unspecified: Secondary | ICD-10-CM | POA: Diagnosis not present

## 2016-04-21 DIAGNOSIS — E1129 Type 2 diabetes mellitus with other diabetic kidney complication: Secondary | ICD-10-CM | POA: Diagnosis not present

## 2016-04-21 DIAGNOSIS — T82898D Other specified complication of vascular prosthetic devices, implants and grafts, subsequent encounter: Secondary | ICD-10-CM | POA: Diagnosis not present

## 2016-04-21 DIAGNOSIS — T814XXS Infection following a procedure, sequela: Secondary | ICD-10-CM | POA: Diagnosis not present

## 2016-04-21 DIAGNOSIS — N186 End stage renal disease: Secondary | ICD-10-CM | POA: Diagnosis not present

## 2016-04-23 DIAGNOSIS — T82898D Other specified complication of vascular prosthetic devices, implants and grafts, subsequent encounter: Secondary | ICD-10-CM | POA: Diagnosis not present

## 2016-04-23 DIAGNOSIS — D689 Coagulation defect, unspecified: Secondary | ICD-10-CM | POA: Diagnosis not present

## 2016-04-23 DIAGNOSIS — D509 Iron deficiency anemia, unspecified: Secondary | ICD-10-CM | POA: Diagnosis not present

## 2016-04-23 DIAGNOSIS — E1129 Type 2 diabetes mellitus with other diabetic kidney complication: Secondary | ICD-10-CM | POA: Diagnosis not present

## 2016-04-23 DIAGNOSIS — N186 End stage renal disease: Secondary | ICD-10-CM | POA: Diagnosis not present

## 2016-04-23 DIAGNOSIS — T814XXS Infection following a procedure, sequela: Secondary | ICD-10-CM | POA: Diagnosis not present

## 2016-04-24 ENCOUNTER — Ambulatory Visit (HOSPITAL_BASED_OUTPATIENT_CLINIC_OR_DEPARTMENT_OTHER): Payer: Medicare Other | Attending: Pulmonary Disease

## 2016-04-25 DIAGNOSIS — N186 End stage renal disease: Secondary | ICD-10-CM | POA: Diagnosis not present

## 2016-04-25 DIAGNOSIS — T82898D Other specified complication of vascular prosthetic devices, implants and grafts, subsequent encounter: Secondary | ICD-10-CM | POA: Diagnosis not present

## 2016-04-25 DIAGNOSIS — E1129 Type 2 diabetes mellitus with other diabetic kidney complication: Secondary | ICD-10-CM | POA: Diagnosis not present

## 2016-04-25 DIAGNOSIS — D689 Coagulation defect, unspecified: Secondary | ICD-10-CM | POA: Diagnosis not present

## 2016-04-25 DIAGNOSIS — T814XXS Infection following a procedure, sequela: Secondary | ICD-10-CM | POA: Diagnosis not present

## 2016-04-25 DIAGNOSIS — D509 Iron deficiency anemia, unspecified: Secondary | ICD-10-CM | POA: Diagnosis not present

## 2016-04-28 DIAGNOSIS — D689 Coagulation defect, unspecified: Secondary | ICD-10-CM | POA: Diagnosis not present

## 2016-04-28 DIAGNOSIS — D509 Iron deficiency anemia, unspecified: Secondary | ICD-10-CM | POA: Diagnosis not present

## 2016-04-28 DIAGNOSIS — T814XXS Infection following a procedure, sequela: Secondary | ICD-10-CM | POA: Diagnosis not present

## 2016-04-28 DIAGNOSIS — T82898D Other specified complication of vascular prosthetic devices, implants and grafts, subsequent encounter: Secondary | ICD-10-CM | POA: Diagnosis not present

## 2016-04-28 DIAGNOSIS — E1129 Type 2 diabetes mellitus with other diabetic kidney complication: Secondary | ICD-10-CM | POA: Diagnosis not present

## 2016-04-28 DIAGNOSIS — N186 End stage renal disease: Secondary | ICD-10-CM | POA: Diagnosis not present

## 2016-04-30 DIAGNOSIS — Z7901 Long term (current) use of anticoagulants: Secondary | ICD-10-CM | POA: Diagnosis not present

## 2016-04-30 DIAGNOSIS — T82898D Other specified complication of vascular prosthetic devices, implants and grafts, subsequent encounter: Secondary | ICD-10-CM | POA: Diagnosis not present

## 2016-04-30 DIAGNOSIS — E1129 Type 2 diabetes mellitus with other diabetic kidney complication: Secondary | ICD-10-CM | POA: Diagnosis not present

## 2016-04-30 DIAGNOSIS — T814XXS Infection following a procedure, sequela: Secondary | ICD-10-CM | POA: Diagnosis not present

## 2016-04-30 DIAGNOSIS — D689 Coagulation defect, unspecified: Secondary | ICD-10-CM | POA: Diagnosis not present

## 2016-04-30 DIAGNOSIS — Z5181 Encounter for therapeutic drug level monitoring: Secondary | ICD-10-CM | POA: Diagnosis not present

## 2016-04-30 DIAGNOSIS — D509 Iron deficiency anemia, unspecified: Secondary | ICD-10-CM | POA: Diagnosis not present

## 2016-04-30 DIAGNOSIS — I82599 Chronic embolism and thrombosis of other specified deep vein of unspecified lower extremity: Secondary | ICD-10-CM | POA: Diagnosis not present

## 2016-04-30 DIAGNOSIS — N186 End stage renal disease: Secondary | ICD-10-CM | POA: Diagnosis not present

## 2016-05-01 ENCOUNTER — Encounter: Payer: Self-pay | Admitting: Vascular Surgery

## 2016-05-01 DIAGNOSIS — E1129 Type 2 diabetes mellitus with other diabetic kidney complication: Secondary | ICD-10-CM | POA: Diagnosis not present

## 2016-05-01 DIAGNOSIS — N186 End stage renal disease: Secondary | ICD-10-CM | POA: Diagnosis not present

## 2016-05-01 DIAGNOSIS — D509 Iron deficiency anemia, unspecified: Secondary | ICD-10-CM | POA: Diagnosis not present

## 2016-05-01 DIAGNOSIS — T814XXS Infection following a procedure, sequela: Secondary | ICD-10-CM | POA: Diagnosis not present

## 2016-05-01 DIAGNOSIS — T82898D Other specified complication of vascular prosthetic devices, implants and grafts, subsequent encounter: Secondary | ICD-10-CM | POA: Diagnosis not present

## 2016-05-01 DIAGNOSIS — D689 Coagulation defect, unspecified: Secondary | ICD-10-CM | POA: Diagnosis not present

## 2016-05-02 DIAGNOSIS — D689 Coagulation defect, unspecified: Secondary | ICD-10-CM | POA: Diagnosis not present

## 2016-05-02 DIAGNOSIS — T814XXS Infection following a procedure, sequela: Secondary | ICD-10-CM | POA: Diagnosis not present

## 2016-05-02 DIAGNOSIS — D509 Iron deficiency anemia, unspecified: Secondary | ICD-10-CM | POA: Diagnosis not present

## 2016-05-02 DIAGNOSIS — E1129 Type 2 diabetes mellitus with other diabetic kidney complication: Secondary | ICD-10-CM | POA: Diagnosis not present

## 2016-05-02 DIAGNOSIS — N186 End stage renal disease: Secondary | ICD-10-CM | POA: Diagnosis not present

## 2016-05-02 DIAGNOSIS — T82898D Other specified complication of vascular prosthetic devices, implants and grafts, subsequent encounter: Secondary | ICD-10-CM | POA: Diagnosis not present

## 2016-05-04 DIAGNOSIS — E1129 Type 2 diabetes mellitus with other diabetic kidney complication: Secondary | ICD-10-CM | POA: Diagnosis not present

## 2016-05-04 DIAGNOSIS — Z992 Dependence on renal dialysis: Secondary | ICD-10-CM | POA: Diagnosis not present

## 2016-05-04 DIAGNOSIS — N186 End stage renal disease: Secondary | ICD-10-CM | POA: Diagnosis not present

## 2016-05-05 DIAGNOSIS — N186 End stage renal disease: Secondary | ICD-10-CM | POA: Diagnosis not present

## 2016-05-05 DIAGNOSIS — E1129 Type 2 diabetes mellitus with other diabetic kidney complication: Secondary | ICD-10-CM | POA: Diagnosis not present

## 2016-05-05 DIAGNOSIS — D689 Coagulation defect, unspecified: Secondary | ICD-10-CM | POA: Diagnosis not present

## 2016-05-05 DIAGNOSIS — D509 Iron deficiency anemia, unspecified: Secondary | ICD-10-CM | POA: Diagnosis not present

## 2016-05-05 DIAGNOSIS — D631 Anemia in chronic kidney disease: Secondary | ICD-10-CM | POA: Diagnosis not present

## 2016-05-07 DIAGNOSIS — N186 End stage renal disease: Secondary | ICD-10-CM | POA: Diagnosis not present

## 2016-05-07 DIAGNOSIS — D631 Anemia in chronic kidney disease: Secondary | ICD-10-CM | POA: Diagnosis not present

## 2016-05-07 DIAGNOSIS — E1129 Type 2 diabetes mellitus with other diabetic kidney complication: Secondary | ICD-10-CM | POA: Diagnosis not present

## 2016-05-07 DIAGNOSIS — D689 Coagulation defect, unspecified: Secondary | ICD-10-CM | POA: Diagnosis not present

## 2016-05-07 DIAGNOSIS — D509 Iron deficiency anemia, unspecified: Secondary | ICD-10-CM | POA: Diagnosis not present

## 2016-05-08 ENCOUNTER — Encounter: Payer: Self-pay | Admitting: Vascular Surgery

## 2016-05-08 ENCOUNTER — Other Ambulatory Visit: Payer: Self-pay

## 2016-05-08 ENCOUNTER — Ambulatory Visit (INDEPENDENT_AMBULATORY_CARE_PROVIDER_SITE_OTHER): Payer: Medicare Other | Admitting: Vascular Surgery

## 2016-05-08 DIAGNOSIS — I70213 Atherosclerosis of native arteries of extremities with intermittent claudication, bilateral legs: Secondary | ICD-10-CM | POA: Diagnosis not present

## 2016-05-08 DIAGNOSIS — N186 End stage renal disease: Secondary | ICD-10-CM

## 2016-05-08 DIAGNOSIS — Z992 Dependence on renal dialysis: Secondary | ICD-10-CM | POA: Diagnosis not present

## 2016-05-08 NOTE — Progress Notes (Signed)
  Established Dialysis Access  History of Present Illness  Shane Hamilton is a 50 y.o. (11/14/1965) male who presents for re-evaluation for permanent access.  The patient has been canceled 3 times due to pulmonary issues.  To date two different Anes teams have been reluctant to proceed due to concerns for extended intubated and ventilation.  Each time he has been referred to the ED for further medical stabilization.  Pt has required right finger amputations since I last saw this patient.  I don't have the records for this procedure.  This emphasizes the fact that this patient has been getting care at multiple sites.  Pt notes flow rates R femoral TDC are <200 cc/min   Past Medical History:  Diagnosis Date  . Abscess of right arm 06/17/2015  . Anemia   . Arteriovenous fistula infection (HCC)   . Cardiomegaly   . Chronic anticoagulation   . Chronic pancreatitis (HCC)   . DVT (deep venous thrombosis) (HCC) 2011  . ESRD (end stage renal disease) (HCC)    [facility]  . GERD (gastroesophageal reflux disease)   . Gout   . Headache    migraines  . Hypertension   . Kidney stones   . LOC (loss of consciousness)   . Morbid obesity (HCC)   . OSA (obstructive sleep apnea)   . Pneumonia   . Secondary hyperparathyroidism (HCC)   . Seizures (HCC)    pt denies  . Septic shock(785.52)   . Shortness of breath dyspnea    with exertion  . Type 2 diabetes mellitus, uncontrolled (HCC)         Past Surgical History:  Procedure Laterality Date  . AMPUTATION FINGER / THUMB Right   . APPLICATION OF WOUND VAC Left 06/20/2015   Procedure: APPLICATION OF WOUND VAC;  Surgeon: Gregory G Schnier, MD;  Location: [facility] ORS;  Service: Vascular;  Laterality: Left;  . AV FISTULA PLACEMENT    . AV FISTULA PLACEMENT Left 06/20/2015   Procedure: ARTERIOVENOUS (AV) FISTULA  ligation , excision;  Surgeon: Gregory G Schnier, MD;  Location: [facility] ORS;  Service: Vascular;  Laterality: Left;  . EXCHANGE OF  A DIALYSIS CATHETER Left 11/13/2015   Procedure: EXCHANGE OF A DIALYSIS CATHETER- LEFT INTERNAL JUGULAR;  Surgeon: Vance W Brabham, MD;  Location: [facility];  Service: Vascular;  Laterality: Left;  . I&D EXTREMITY Left 06/20/2015   Procedure: IRRIGATION AND DEBRIDEMENT EXTREMITY;  Surgeon: Gregory G Schnier, MD;  Location: [facility] ORS;  Service: Vascular;  Laterality: Left;  . INSERTION OF DIALYSIS CATHETER N/A 06/25/2015   Procedure: place a tunnelled catheter for long term use;  Surgeon: Jason S Dew, MD;  Location: [facility] ORS;  Service: Vascular;  Laterality: N/A;  . INSERTION OF DIALYSIS CATHETER Left 07/25/2015   Procedure: INSERTION OF LEFT INTERNAL JUGULAR DIALYSIS CATHETER;  Surgeon: James D Lawson, MD;  Location: [facility] OR;  Service: Vascular;  Laterality: Left;  . INSERTION OF DIALYSIS CATHETER Left 08/01/2015   Procedure: REMOVAL OF DIALYSIS CATHETER; PLACEMNET OF NEW DIALYSIS CATHETER;  Surgeon: James D Lawson, MD;  Location: [facility] OR;  Service: Vascular;  Laterality: Left;  . INSERTION OF DIALYSIS CATHETER Right 03/31/2016   Procedure: INSERTION OF DIALYSIS CATHETER RIGHT FEMORAL ;  Surgeon: Christopher S Dickson, MD;  Location: [facility];  Service: Vascular;  Laterality: Right;  . PERIPHERAL VASCULAR CATHETERIZATION N/A 06/25/2015   Procedure: Dialysis/Perma Catheter Insertion;  Surgeon: Jason S Dew, MD;  Location: [facility] INVASIVE CV LAB;    Service: Cardiovascular;  Laterality: N/A;  . PERIPHERAL VASCULAR CATHETERIZATION Bilateral 09/13/2015   Procedure: Upper Extremity Venography;  Surgeon: Sherren Kerns, MD;  Location: [facility] INVASIVE CV LAB;  Service: Cardiovascular;  Laterality: Bilateral;    Social History   Social History  . Marital status: Married    Spouse name: N/A  . Number of children: N/A  . Years of education: N/A   Occupational History  . disabled     Social History Main Topics  . Smoking status: Never Smoker  . Smokeless tobacco: Never Used  . Alcohol use No  . Drug use: No  . Sexual  activity: Not on file   Other Topics Concern  . Not on file   Social History Narrative  . No narrative on file    Family History  Problem Relation Age of Onset  . Diabetes Mother   . Hypertension Mother   . Diabetes Father   . Hypertension Father   . Hypertension Brother     Current Outpatient Prescriptions  Medication Sig Dispense Refill  . allopurinol (ZYLOPRIM) 300 MG tablet Take 300 mg by mouth 2 (two) times daily.    Marland Kitchen atorvastatin (LIPITOR) 10 MG tablet Take 10 mg by mouth daily.    . BD PEN NEEDLE NANO U/F 32G X 4 MM MISC     . calcium acetate (PHOSLO) 667 MG capsule Take 2,001 mg by mouth 3 (three) times daily with meals.     . carvedilol (COREG) 3.125 MG tablet Take 1 tablet (3.125 mg total) by mouth 2 (two) times daily with a meal. 60 tablet 0  . ceFAZolin (ANCEF) 2-4 GM/100ML-% IVPB Inject 100 mLs (2 g total) into the vein Every Tuesday,Thursday,and Saturday with dialysis. For 2 weeks, till July 11 5 each 0  . cinacalcet (SENSIPAR) 60 MG tablet Take 60 mg by mouth daily.    . clonazePAM (KLONOPIN) 1 MG tablet Take 0.5 tablets (0.5 mg total) by mouth 2 (two) times daily as needed for anxiety. 30 tablet 0  . colchicine 0.6 MG tablet Take 0.6 mg by mouth 3 (three) times daily.     Marland Kitchen gabapentin (NEURONTIN) 300 MG capsule Take 1 capsule (300 mg total) by mouth at bedtime.    . insulin glargine (LANTUS) 100 UNIT/ML injection Inject 0.35 mLs (35 Units total) into the skin at bedtime. (Patient taking differently: Inject 35 Units into the skin at bedtime. ) 10 mL 11  . lanthanum (FOSRENOL) 1000 MG chewable tablet Chew 1,000 mg by mouth 3 (three) times daily with meals.     . multivitamin (RENA-VIT) TABS tablet Take 1 tablet by mouth daily.   3  . omeprazole (PRILOSEC) 20 MG capsule Take 20 mg by mouth 2 (two) times daily before a meal.     . Oxycodone HCl 10 MG TABS Take 1 tablet (10 mg total) by mouth 2 (two) times daily as needed (pain). 20 tablet 0  . polyethylene glycol  (MIRALAX / GLYCOLAX) packet Take 17 g by mouth daily as needed for mild constipation. 14 each 0  . warfarin (COUMADIN) 10 MG tablet Take 10 mg by mouth daily.     No current facility-administered medications for this visit.      Allergies  Allergen Reactions  . Aspirin Other (See Comments)    Reaction:  GI upset     REVIEW OF SYSTEMS:  (Positives checked otherwise negative)  CARDIOVASCULAR:   [ ]  chest pain,  [ ]  chest pressure,  [ ]  palpitations,  [ ]   shortness of breath when laying flat,  [ ]  shortness of breath with exertion,   [ ]  pain in feet when walking,  [ ]  pain in feet when laying flat, [ ]  history of blood clot in veins (DVT),  [ ]  history of phlebitis,  [ ]  swelling in legs,  [ ]  varicose veins  PULMONARY:   [ ]  productive cough,  [ ]  asthma,  [ ]  wheezing  NEUROLOGIC:   [ ]  weakness in arms or legs,  [ ]  numbness in arms or legs,  [ ]  difficulty speaking or slurred speech,  [ ]  temporary loss of vision in one eye,  [ ]  dizziness  HEMATOLOGIC:   [ ]  bleeding problems,  [ ]  problems with blood clotting too easily  MUSCULOSKEL:   [ ]  joint pain, [ ]  joint swelling  GASTROINTEST:   [ ]  vomiting blood,  [ ]  blood in stool     GENITOURINARY:   [ ]  burning with urination,  [ ]  blood in urine  PSYCHIATRIC:   [ ]  history of major depression  INTEGUMENTARY:   [ ]  rashes,  [ ]  ulcers  CONSTITUTIONAL:   [ ]  fever,  [ ]  chills     Physical Examination  Vitals:   05/08/16 1112  BP: 122/64  Pulse: (!) 102  SpO2: 90%  Weight: (!) 388 lb (176 kg)  Height: 5\' 11"  (1.803 m)   Body mass index is 54.12 kg/m.  General: awake briefly then becomes drowsy, morbidly obese  Pulmonary: Sym exp, good air movt, BLL rales, mild wheezing  Cardiac: RR, tachycardia, Nl S1, S2, no Murmurs, rubs or gallops  Vascular: Vessel Right Left  Radial Not Palpable Faintly Palpable  Ulnar Not Palpable Not Palpable  Brachial Faintly Palpable Palpable    Gastrointestinal: soft, morbidly obese, no TTP, -G/R  Musculoskeletal: wheel chair bound, R hand digit amputations healed, limited hand grip, L arm incisions healed  Neurologic: Pain and light touch intact in extremities , Motor exam as listed above   Medical Decision Making  Danise MinaReginald D Woodle is a 50 y.o. 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  I suspect his super morbidity is a substantial component of this.  I will refer him to Pulmonology for further evaluation and optimization in hopes he can clear the Anesthesia hurdles  Minimally, he is going to need: fibrin sheath angioplasty and R femoral TDC exchange in the short term.  The patient prefers to have to this done on a Friday, so I will have to schedule this with one of my partners.  Eventually he is going to need: L axillary exploration, L central venogram, possible hybrid Gore AVG placement if he can clear Anesthesia.   Leonides SakeBrian Zhania Shaheen, MD, FACS [facility] [place] Office: [phone] Pager: [phone]

## 2016-05-09 DIAGNOSIS — E1129 Type 2 diabetes mellitus with other diabetic kidney complication: Secondary | ICD-10-CM | POA: Diagnosis not present

## 2016-05-09 DIAGNOSIS — D689 Coagulation defect, unspecified: Secondary | ICD-10-CM | POA: Diagnosis not present

## 2016-05-09 DIAGNOSIS — D631 Anemia in chronic kidney disease: Secondary | ICD-10-CM | POA: Diagnosis not present

## 2016-05-09 DIAGNOSIS — N186 End stage renal disease: Secondary | ICD-10-CM | POA: Diagnosis not present

## 2016-05-09 DIAGNOSIS — D509 Iron deficiency anemia, unspecified: Secondary | ICD-10-CM | POA: Diagnosis not present

## 2016-05-11 ENCOUNTER — Institutional Professional Consult (permissible substitution): Payer: Medicare Other | Admitting: Pulmonary Disease

## 2016-05-12 DIAGNOSIS — E1129 Type 2 diabetes mellitus with other diabetic kidney complication: Secondary | ICD-10-CM | POA: Diagnosis not present

## 2016-05-12 DIAGNOSIS — D689 Coagulation defect, unspecified: Secondary | ICD-10-CM | POA: Diagnosis not present

## 2016-05-12 DIAGNOSIS — N186 End stage renal disease: Secondary | ICD-10-CM | POA: Diagnosis not present

## 2016-05-12 DIAGNOSIS — D631 Anemia in chronic kidney disease: Secondary | ICD-10-CM | POA: Diagnosis not present

## 2016-05-12 DIAGNOSIS — D509 Iron deficiency anemia, unspecified: Secondary | ICD-10-CM | POA: Diagnosis not present

## 2016-05-13 ENCOUNTER — Encounter (HOSPITAL_COMMUNITY): Payer: Self-pay | Admitting: *Deleted

## 2016-05-13 NOTE — Progress Notes (Signed)
Pt has hx of cardiomegaly. Denies any chest pain or sob. Pt is diabetic, states his fasting blood sugar is usually around 80-90. Instructed pt to take 1/2 of his regular Lantus Insulin Thursday night (he should take 35 units).   If blood sugar is 70 or below, treat with 1/2 cup of clear juice (apple or cranberry) and recheck blood sugar 15 minutes after drinking juice. If blood sugar continues to be 70 or below, call the Short Stay department and ask to speak to a nurse.

## 2016-05-14 DIAGNOSIS — D509 Iron deficiency anemia, unspecified: Secondary | ICD-10-CM | POA: Diagnosis not present

## 2016-05-14 DIAGNOSIS — N186 End stage renal disease: Secondary | ICD-10-CM | POA: Diagnosis not present

## 2016-05-14 DIAGNOSIS — D631 Anemia in chronic kidney disease: Secondary | ICD-10-CM | POA: Diagnosis not present

## 2016-05-14 DIAGNOSIS — E1129 Type 2 diabetes mellitus with other diabetic kidney complication: Secondary | ICD-10-CM | POA: Diagnosis not present

## 2016-05-14 DIAGNOSIS — D689 Coagulation defect, unspecified: Secondary | ICD-10-CM | POA: Diagnosis not present

## 2016-05-14 MED ORDER — DEXTROSE 5 % IV SOLN
1.5000 g | INTRAVENOUS | Status: AC
  Administered 2016-05-15: 1.5 g via INTRAVENOUS
  Filled 2016-05-14 (×2): qty 1.5

## 2016-05-14 NOTE — Progress Notes (Signed)
Anesthesia Chart Review:  Pt is a 50 year old male scheduled for exchange of R femoral dialysis catheter, R fibrin sheath angioplasty on 05/15/2016 with Gretta Beganodd Early, MD.   Pt is a same day work up.   PMH includes:   HTN, DM, cardiomegaly, chronic pancreatitis, OSA, ESRD on dialysis, DVT, anemia, secondary hyperparathyroidism, GERD. Never smoker. BMI 54  Medications include: lipitor, carvedilol, lantus, prilosec, coumadin. Pt to continue coumadin perioperatively.   Labs will be obtained DOS.   1 view CXR 03/31/16: Central catheter tip in right atrium. No pneumothorax. Stable cardiac prominence. No edema or consolidation.  EKG 03/26/16: Sinus tachycardia (127 bpm). RBBB.   Echo 01/07/10:  - Left ventricle: The cavity size was normal. Systolic function was normal. The estimated ejection fraction was in the range of 60% to 65%. Wall motion was normal; there were no regional wall motion abnormalities. - Right ventricle: The cavity size was moderately dilated. Systolic function was reduced. - Right atrium: The atrium was mildly dilated. - Atrial septum: No defect or patent foramen ovale was identified. - Pulmonary arteries: Systolic pressure was moderately to severely increased. PA peak pressure: 65mm Hg (S). 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 pulmonary embolism). However, the moderate to severe increase in PA pressure and the absence of inferior vena cava dilatation suggest there is also a chronic component (e.g. COPD, sleep apnea, chronic thromboembolic disease, etc.).  Note by Leonides SakeBrian Chen, MD dated 05/08/16 states "The patient has been canceled 3 times due to pulmonary issues.  To date two different Anes teams have been reluctant to proceed due to concerns for extended intubated and ventilation.  Each time he has been referred to the ED for further medical stabilization." He further states pt "will not clear anesthesia concerns without further optimization"  and referred pt to pulmonology. Pt had a pulmonology appointment 05/11/16 but did not show. Pt has likely fibrin sheath obstruction of dialysis catheter and requires exchange of dialysis catheter and fibrin sheath angioplasty.   Pt will need further assessment by anesthesiologist DOS.   Rica Mastngela Jahne Krukowski, FNP-BC [facility]iology Phone: [phone] 05/14/2016 2:01 PM

## 2016-05-15 ENCOUNTER — Encounter (HOSPITAL_COMMUNITY): Payer: Self-pay | Admitting: *Deleted

## 2016-05-15 ENCOUNTER — Ambulatory Visit (HOSPITAL_COMMUNITY): Payer: Medicare Other | Admitting: Emergency Medicine

## 2016-05-15 ENCOUNTER — Encounter (HOSPITAL_COMMUNITY)
Admission: RE | Disposition: A | Discharge status: Home / Self Care | Payer: Self-pay | Source: Ambulatory Visit | Attending: Vascular Surgery

## 2016-05-15 ENCOUNTER — Ambulatory Visit (HOSPITAL_COMMUNITY)
Admission: RE | Admit: 2016-05-15 | Discharge: 2016-05-15 | Disposition: A | Discharge status: Home / Self Care | Payer: Medicare Other | Source: Ambulatory Visit | Attending: Vascular Surgery | Admitting: Vascular Surgery

## 2016-05-15 ENCOUNTER — Ambulatory Visit (HOSPITAL_COMMUNITY): Payer: Medicare Other

## 2016-05-15 DIAGNOSIS — I82509 Chronic embolism and thrombosis of unspecified deep veins of unspecified lower extremity: Secondary | ICD-10-CM | POA: Diagnosis not present

## 2016-05-15 DIAGNOSIS — D649 Anemia, unspecified: Secondary | ICD-10-CM | POA: Diagnosis not present

## 2016-05-15 DIAGNOSIS — E1122 Type 2 diabetes mellitus with diabetic chronic kidney disease: Secondary | ICD-10-CM | POA: Diagnosis not present

## 2016-05-15 DIAGNOSIS — Z992 Dependence on renal dialysis: Secondary | ICD-10-CM

## 2016-05-15 DIAGNOSIS — Z7901 Long term (current) use of anticoagulants: Secondary | ICD-10-CM | POA: Diagnosis not present

## 2016-05-15 DIAGNOSIS — R918 Other nonspecific abnormal finding of lung field: Secondary | ICD-10-CM | POA: Diagnosis not present

## 2016-05-15 DIAGNOSIS — I120 Hypertensive chronic kidney disease with stage 5 chronic kidney disease or end stage renal disease: Secondary | ICD-10-CM | POA: Insufficient documentation

## 2016-05-15 DIAGNOSIS — N2581 Secondary hyperparathyroidism of renal origin: Secondary | ICD-10-CM | POA: Diagnosis not present

## 2016-05-15 DIAGNOSIS — Z86718 Personal history of other venous thrombosis and embolism: Secondary | ICD-10-CM | POA: Diagnosis not present

## 2016-05-15 DIAGNOSIS — N186 End stage renal disease: Secondary | ICD-10-CM | POA: Insufficient documentation

## 2016-05-15 DIAGNOSIS — G4733 Obstructive sleep apnea (adult) (pediatric): Secondary | ICD-10-CM | POA: Insufficient documentation

## 2016-05-15 DIAGNOSIS — Z6841 Body Mass Index (BMI) 40.0 and over, adult: Secondary | ICD-10-CM | POA: Diagnosis not present

## 2016-05-15 DIAGNOSIS — K219 Gastro-esophageal reflux disease without esophagitis: Secondary | ICD-10-CM | POA: Diagnosis not present

## 2016-05-15 DIAGNOSIS — T82898A Other specified complication of vascular prosthetic devices, implants and grafts, initial encounter: Secondary | ICD-10-CM | POA: Diagnosis not present

## 2016-05-15 DIAGNOSIS — Z95828 Presence of other vascular implants and grafts: Secondary | ICD-10-CM

## 2016-05-15 HISTORY — DX: Anxiety disorder, unspecified: F41.9

## 2016-05-15 HISTORY — PX: EXCHANGE OF A DIALYSIS CATHETER: SHX5818

## 2016-05-15 HISTORY — PX: ANGIOPLASTY ILLIAC ARTERY: SHX5720

## 2016-05-15 LAB — GLUCOSE, CAPILLARY
GLUCOSE-CAPILLARY: 190 mg/dL — AB (ref 65–99)
GLUCOSE-CAPILLARY: 163 mg/dL — AB (ref 65–99)

## 2016-05-15 LAB — APTT: aPTT: 34 seconds (ref 24–36)

## 2016-05-15 LAB — BASIC METABOLIC PANEL
Anion gap: 12 (ref 5–15)
BUN: 64 mg/dL — AB (ref 6–20)
CHLORIDE: 99 mmol/L — AB (ref 101–111)
CO2: 23 mmol/L (ref 22–32)
Calcium: 8.9 mg/dL (ref 8.9–10.3)
Creatinine, Ser: 11.5 mg/dL — ABNORMAL HIGH (ref 0.61–1.24)
GFR calc Af Amer: 5 mL/min — ABNORMAL LOW (ref >60)
GFR, EST NON AFRICAN AMERICAN: 5 mL/min — AB (ref >60)
Glucose, Bld: 176 mg/dL — ABNORMAL HIGH (ref 65–99)
Potassium: 5.8 mmol/L — ABNORMAL HIGH (ref 3.5–5.1)
SODIUM: 134 mmol/L — AB (ref 135–145)

## 2016-05-15 LAB — POCT I-STAT 4, (NA,K, GLUC, HGB,HCT)
GLUCOSE: 177 mg/dL — AB (ref 65–99)
HEMATOCRIT: 39 % (ref 39.0–52.0)
Hemoglobin: 13.3 g/dL (ref 13.0–17.0)
POTASSIUM: 6.2 mmol/L — AB (ref 3.5–5.1)
Sodium: 138 mmol/L (ref 135–145)

## 2016-05-15 LAB — PROTIME-INR
INR: 1.37
PROTHROMBIN TIME: 17 s — AB (ref 11.4–15.2)

## 2016-05-15 SURGERY — EXCHANGE OF A DIALYSIS CATHETER
Anesthesia: Choice | Site: Groin | Laterality: Right

## 2016-05-15 MED ORDER — LIDOCAINE-EPINEPHRINE (PF) 1 %-1:200000 IJ SOLN
INTRAMUSCULAR | Status: DC | PRN
  Administered 2016-05-15: 15 mL

## 2016-05-15 MED ORDER — SODIUM CHLORIDE 0.9 % IV SOLN
INTRAVENOUS | Status: DC | PRN
  Administered 2016-05-15: 500 mL

## 2016-05-15 MED ORDER — CHLORHEXIDINE GLUCONATE CLOTH 2 % EX PADS: 6.0000 | MEDICATED_PAD | Freq: Once | CUTANEOUS | Status: DC

## 2016-05-15 MED ORDER — CARVEDILOL 3.125 MG PO TABS
ORAL_TABLET | ORAL | Status: AC
  Filled 2016-05-15: qty 1

## 2016-05-15 MED ORDER — IODIXANOL 320 MG/ML IV SOLN
INTRAVENOUS | Status: DC | PRN
  Administered 2016-05-15: 12 mL via INTRAVENOUS

## 2016-05-15 MED ORDER — SODIUM CHLORIDE 0.9 % IV SOLN
INTRAVENOUS | Status: DC
  Administered 2016-05-15: 10 mL/h via INTRAVENOUS

## 2016-05-15 MED ORDER — HEPARIN SODIUM (PORCINE) 1000 UNIT/ML IJ SOLN
INTRAMUSCULAR | Status: AC
  Filled 2016-05-15: qty 1

## 2016-05-15 MED ORDER — IOPAMIDOL (ISOVUE-300) INJECTION 61%
INTRAVENOUS | Status: AC
  Filled 2016-05-15: qty 50

## 2016-05-15 MED ORDER — HEPARIN SODIUM (PORCINE) 1000 UNIT/ML IJ SOLN
INTRAMUSCULAR | Status: DC | PRN
  Administered 2016-05-15: 6 mL

## 2016-05-15 MED ORDER — CARVEDILOL 3.125 MG PO TABS
3.1250 mg | ORAL_TABLET | Freq: Once | ORAL | Status: AC
  Administered 2016-05-15: 3.125 mg via ORAL

## 2016-05-15 SURGICAL SUPPLY — 84 items
BAG BANDED W/RUBBER/TAPE 36X54 (MISCELLANEOUS) ×3 IMPLANT
BAG DECANTER FOR FLEXI CONT (MISCELLANEOUS) ×3 IMPLANT
BAG SNAP BAND KOVER 36X36 (MISCELLANEOUS) IMPLANT
BALLN CODA OCL 2-9.0-35-120-3 (BALLOONS)
BALLN MUSTANG 12.0X40 75 (BALLOONS) ×2
BALLN MUSTANG 12.0X40 75CM (BALLOONS) ×1
BALLOON COD OCL 2-9.0-35-120-3 (BALLOONS) IMPLANT
BALLOON MUSTANG 12.0X40 75 (BALLOONS) ×1 IMPLANT
BENZOIN TINCTURE PRP APPL 2/3 (GAUZE/BANDAGES/DRESSINGS) ×3 IMPLANT
BIOPATCH RED 1 DISK 7.0 (GAUZE/BANDAGES/DRESSINGS) ×2 IMPLANT
BIOPATCH RED 1IN DISK 7.0MM (GAUZE/BANDAGES/DRESSINGS) ×1
CANISTER SUCTION 2500CC (MISCELLANEOUS) ×3 IMPLANT
CATH PALINDROME RT-P 15FX19CM (CATHETERS) IMPLANT
CATH PALINDROME RT-P 15FX23CM (CATHETERS) IMPLANT
CATH PALINDROME RT-P 15FX28CM (CATHETERS) IMPLANT
CATH PALINDROME RT-P 15FX55CM (CATHETERS) ×3 IMPLANT
CATH STRAIGHT 5FR 65CM (CATHETERS) ×3 IMPLANT
CLIP LIGATING EXTRA MED SLVR (CLIP) IMPLANT
CLIP LIGATING EXTRA SM BLUE (MISCELLANEOUS) IMPLANT
CLOSURE WOUND 1/2 X4 (GAUZE/BANDAGES/DRESSINGS)
COVER DOME SNAP 22 D (MISCELLANEOUS) ×3 IMPLANT
COVER MAYO STAND STRL (DRAPES) IMPLANT
COVER PROBE W GEL 5X96 (DRAPES) IMPLANT
DECANTER SPIKE VIAL GLASS SM (MISCELLANEOUS) IMPLANT
DEVICE INFLATION ENCORE 26 (MISCELLANEOUS) ×3 IMPLANT
DRAIN CHANNEL 10F 3/8 F FF (DRAIN) IMPLANT
DRAIN CHANNEL 10M FLAT 3/4 FLT (DRAIN) IMPLANT
DRAPE C-ARM 42X72 X-RAY (DRAPES) IMPLANT
DRAPE CHEST BREAST 15X10 FENES (DRAPES) IMPLANT
DRAPE TABLE COVER HEAVY DUTY (DRAPES) IMPLANT
DRESSING OPSITE X SMALL 2X3 (GAUZE/BANDAGES/DRESSINGS) IMPLANT
ELECT CAUTERY BLADE 6.4 (BLADE) IMPLANT
ELECT REM PT RETURN 9FT ADLT (ELECTROSURGICAL) ×3
ELECTRODE REM PT RTRN 9FT ADLT (ELECTROSURGICAL) ×1 IMPLANT
EVACUATOR 3/16  PVC DRAIN (DRAIN)
EVACUATOR 3/16 PVC DRAIN (DRAIN) IMPLANT
EVACUATOR SILICONE 100CC (DRAIN) IMPLANT
GAUZE SPONGE 2X2 8PLY STRL LF (GAUZE/BANDAGES/DRESSINGS) ×1 IMPLANT
GAUZE SPONGE 4X4 12PLY STRL (GAUZE/BANDAGES/DRESSINGS) ×3 IMPLANT
GAUZE SPONGE 4X4 16PLY XRAY LF (GAUZE/BANDAGES/DRESSINGS) ×3 IMPLANT
GLOVE SS BIOGEL STRL SZ 7.5 (GLOVE) ×1 IMPLANT
GLOVE SUPERSENSE BIOGEL SZ 7.5 (GLOVE) ×2
GOWN STRL REUS W/ TWL LRG LVL3 (GOWN DISPOSABLE) ×4 IMPLANT
GOWN STRL REUS W/TWL LRG LVL3 (GOWN DISPOSABLE) ×8
KIT BASIN OR (CUSTOM PROCEDURE TRAY) ×3 IMPLANT
KIT ROOM TURNOVER OR (KITS) ×3 IMPLANT
LIQUID BAND (GAUZE/BANDAGES/DRESSINGS) ×3 IMPLANT
NEEDLE 18GX1X1/2 (RX/OR ONLY) (NEEDLE) IMPLANT
NEEDLE 22X1 1/2 (OR ONLY) (NEEDLE) IMPLANT
NEEDLE HYPO 25GX1X1/2 BEV (NEEDLE) ×3 IMPLANT
NEEDLE PERC 18GX7CM (NEEDLE) IMPLANT
NS IRRIG 1000ML POUR BTL (IV SOLUTION) IMPLANT
PACK AORTA (CUSTOM PROCEDURE TRAY) IMPLANT
PACK GENERAL/GYN (CUSTOM PROCEDURE TRAY) ×3 IMPLANT
PACK SURGICAL SETUP 50X90 (CUSTOM PROCEDURE TRAY) IMPLANT
PAD ARMBOARD 7.5X6 YLW CONV (MISCELLANEOUS) ×6 IMPLANT
PENCIL BUTTON HOLSTER BLD 10FT (ELECTRODE) IMPLANT
PROTECTION STATION PRESSURIZED (MISCELLANEOUS)
SOAP 2 % CHG 4 OZ (WOUND CARE) ×3 IMPLANT
SPONGE GAUZE 2X2 STER 10/PKG (GAUZE/BANDAGES/DRESSINGS) ×2
STAPLER VISISTAT 35W (STAPLE) IMPLANT
STATION PROTECTION PRESSURIZED (MISCELLANEOUS) IMPLANT
STOPCOCK MORSE 400PSI 3WAY (MISCELLANEOUS) IMPLANT
STRIP CLOSURE SKIN 1/2X4 (GAUZE/BANDAGES/DRESSINGS) IMPLANT
SUT ETHILON 3 0 PS 1 (SUTURE) ×3 IMPLANT
SUT PROLENE 5 0 C 1 24 (SUTURE) IMPLANT
SUT VIC AB 2-0 CTX 36 (SUTURE) ×3 IMPLANT
SUT VIC AB 3-0 SH 18 (SUTURE) IMPLANT
SUT VIC AB 3-0 SH 27 (SUTURE)
SUT VIC AB 3-0 SH 27X BRD (SUTURE) IMPLANT
SUT VICRYL 4-0 PS2 18IN ABS (SUTURE) ×3 IMPLANT
SYR 20CC LL (SYRINGE) ×3 IMPLANT
SYR 30ML LL (SYRINGE) IMPLANT
SYR 5ML LL (SYRINGE) ×6 IMPLANT
SYR CONTROL 10ML LL (SYRINGE) ×3 IMPLANT
SYR MEDRAD MARK V 150ML (SYRINGE) IMPLANT
SYRINGE 10CC LL (SYRINGE) ×6 IMPLANT
TAPE CLOTH SURG 4X10 WHT LF (GAUZE/BANDAGES/DRESSINGS) ×3 IMPLANT
TOWEL OR 17X24 6PK STRL BLUE (TOWEL DISPOSABLE) ×3 IMPLANT
TOWEL OR 17X26 10 PK STRL BLUE (TOWEL DISPOSABLE) ×3 IMPLANT
TRAY FOLEY W/METER SILVER 16FR (SET/KITS/TRAYS/PACK) IMPLANT
TUBING HIGH PRESSURE 120CM (CONNECTOR) IMPLANT
WATER STERILE IRR 1000ML POUR (IV SOLUTION) IMPLANT
WIRE BENTSON .035X145CM (WIRE) ×6 IMPLANT

## 2016-05-15 NOTE — Interval H&P Note (Signed)
History and Physical Interval Note:  05/15/2016 9:32 AM  Shane Hamilton  has presented today for surgery, with the diagnosis of End Stage Renal Disease N18.6  The various methods of treatment have been discussed with the patient and family. After consideration of risks, benefits and other options for treatment, the patient has consented to  Procedure(s): EXCHANGE OF A FEMORAL DIALYSIS CATHETER (Right) FIBRIN SHEATH ANGIOPLASTY (Right) as a surgical intervention .  The patient's history has been reviewed, patient examined, no change in status, stable for surgery.  I have reviewed the patient's chart and labs.  Questions were answered to the patient's satisfaction.    Difficult access management situation. Having poor flow with his right femoral catheter. Severe pulmonary compromise. Have discussed with anesthesia. We'll proceed with straight local. Patient is able to lay flat for a brief period of time. He understands this will be straight local as well. Also understands this may not improve flow but hopefully this is related to fibrin sheath and positioning.  Gretta BeganEarly, Janequa Kipnis

## 2016-05-15 NOTE — Anesthesia Preprocedure Evaluation (Addendum)
Anesthesia Evaluation  Patient identified by MRN, date of birth, ID band Patient awake    Reviewed: Allergy & Precautions, NPO status , Patient's Chart, lab work & pertinent test results, reviewed documented beta blocker date and time   Airway Mallampati: III  TM Distance: >3 FB Neck ROM: Limited    Dental  (+) Teeth Intact, Dental Advisory Given   Pulmonary shortness of breath, asthma , sleep apnea , pneumonia, resolved,  Does not use his CPAP   breath sounds clear to auscultation       Cardiovascular hypertension, Pt. on medications and Pt. on home beta blockers + Peripheral Vascular Disease   Rhythm:Regular Rate:Normal  Coreg hs 11/11/2014   Neuro/Psych  Headaches, Seizures -, Well Controlled,     GI/Hepatic GERD  ,  Endo/Other  diabetes, Poorly Controlled, Type 2Morbid obesity  Renal/GU CRF, ESRF and DialysisRenal diseaseHD 11/11/2014     Musculoskeletal   Abdominal (+) + obese,   Peds  Hematology  (+) anemia ,   Anesthesia Other Findings   Reproductive/Obstetrics                           Anesthesia Physical  Anesthesia Plan  ASA: IV  Anesthesia Plan:    Post-op Pain Management:    Induction: Intravenous  Airway Management Planned:   Additional Equipment:   Intra-op Plan:   Post-operative Plan:   Informed Consent: I have reviewed the patients History and Physical, chart, labs and discussed the procedure including the risks, benefits and alternatives for the proposed anesthesia with the patient or authorized representative who has indicated his/her understanding and acceptance.   Dental advisory given  Plan Discussed with: CRNA  Anesthesia Plan Comments:         Anesthesia Quick Evaluation

## 2016-05-15 NOTE — H&P (View-Only) (Signed)
Established Dialysis Access  History of Present Illness  Shane Hamilton is a 50 y.o. (June 25, 1966) male who presents for re-evaluation for permanent access.  The patient has been canceled 3 times due to pulmonary issues.  To date two different Anes teams have been reluctant to proceed due to concerns for extended intubated and ventilation.  Each time he has been referred to the ED for further medical stabilization.  Pt has required right finger amputations since I last saw this patient.  I don't have the records for this procedure.  This emphasizes the fact that this patient has been getting care at multiple sites.  Pt notes flow rates R femoral TDC are <200 cc/min   Past Medical History:  Diagnosis Date  . Abscess of right arm 06/17/2015  . Anemia   . Arteriovenous fistula infection (HCC)   . Cardiomegaly   . Chronic anticoagulation   . Chronic pancreatitis (HCC)   . DVT (deep venous thrombosis) (HCC) 2011  . ESRD (end stage renal disease) (HCC)    [facility] [place]  . GERD (gastroesophageal reflux disease)   . Gout   . Headache    migraines  . Hypertension   . Kidney stones   . LOC (loss of consciousness)   . Morbid obesity (HCC)   . OSA (obstructive sleep apnea)   . Pneumonia   . Secondary hyperparathyroidism (HCC)   . Seizures (HCC)    pt denies  . Septic shock(785.52)   . Shortness of breath dyspnea    with exertion  . Type 2 diabetes mellitus, uncontrolled (HCC)         Past Surgical History:  Procedure Laterality Date  . AMPUTATION FINGER / THUMB Right   . APPLICATION OF WOUND VAC Left 06/20/2015   Procedure: APPLICATION OF WOUND VAC;  Surgeon: Renford Dills, MD;  Location: [facility] ORS;  Service: Vascular;  Laterality: Left;  . AV FISTULA PLACEMENT    . AV FISTULA PLACEMENT Left 06/20/2015   Procedure: ARTERIOVENOUS (AV) FISTULA  ligation , excision;  Surgeon: Renford Dills, MD;  Location: [facility] ORS;  Service: Vascular;  Laterality: Left;  . EXCHANGE OF  A DIALYSIS CATHETER Left 11/13/2015   Procedure: EXCHANGE OF A DIALYSIS CATHETER- LEFT INTERNAL JUGULAR;  Surgeon: Nada Libman, MD;  Location: [facility] OR;  Service: Vascular;  Laterality: Left;  . I&D EXTREMITY Left 06/20/2015   Procedure: IRRIGATION AND DEBRIDEMENT EXTREMITY;  Surgeon: Renford Dills, MD;  Location: [facility] ORS;  Service: Vascular;  Laterality: Left;  . INSERTION OF DIALYSIS CATHETER N/A 06/25/2015   Procedure: place a tunnelled catheter for long term use;  Surgeon: Annice Needy, MD;  Location: [facility] ORS;  Service: Vascular;  Laterality: N/A;  . INSERTION OF DIALYSIS CATHETER Left 07/25/2015   Procedure: INSERTION OF LEFT INTERNAL JUGULAR DIALYSIS CATHETER;  Surgeon: Pryor Ochoa, MD;  Location: [facility] OR;  Service: Vascular;  Laterality: Left;  . INSERTION OF DIALYSIS CATHETER Left 08/01/2015   Procedure: REMOVAL OF DIALYSIS CATHETER; PLACEMNET OF NEW DIALYSIS CATHETER;  Surgeon: Pryor Ochoa, MD;  Location: [facility] OR;  Service: Vascular;  Laterality: Left;  . INSERTION OF DIALYSIS CATHETER Right 03/31/2016   Procedure: INSERTION OF DIALYSIS CATHETER RIGHT FEMORAL ;  Surgeon: Chuck Hint, MD;  Location: [facility] OR;  Service: Vascular;  Laterality: Right;  . PERIPHERAL VASCULAR CATHETERIZATION N/A 06/25/2015   Procedure: Dialysis/Perma Catheter Insertion;  Surgeon: Annice Needy, MD;  Location: [facility] INVASIVE CV LAB;  Service: Cardiovascular;  Laterality: N/A;  . PERIPHERAL VASCULAR CATHETERIZATION Bilateral 09/13/2015   Procedure: Upper Extremity Venography;  Surgeon: Sherren Kerns, MD;  Location: [facility] INVASIVE CV LAB;  Service: Cardiovascular;  Laterality: Bilateral;    Social History   Social History  . Marital status: Married    Spouse name: N/A  . Number of children: N/A  . Years of education: N/A   Occupational History  . disabled     Social History Main Topics  . Smoking status: Never Smoker  . Smokeless tobacco: Never Used  . Alcohol use No  . Drug use: No  . Sexual  activity: Not on file   Other Topics Concern  . Not on file   Social History Narrative  . No narrative on file    Family History  Problem Relation Age of Onset  . Diabetes Mother   . Hypertension Mother   . Diabetes Father   . Hypertension Father   . Hypertension Brother     Current Outpatient Prescriptions  Medication Sig Dispense Refill  . allopurinol (ZYLOPRIM) 300 MG tablet Take 300 mg by mouth 2 (two) times daily.    Marland Kitchen atorvastatin (LIPITOR) 10 MG tablet Take 10 mg by mouth daily.    . BD PEN NEEDLE NANO U/F 32G X 4 MM MISC     . calcium acetate (PHOSLO) 667 MG capsule Take 2,001 mg by mouth 3 (three) times daily with meals.     . carvedilol (COREG) 3.125 MG tablet Take 1 tablet (3.125 mg total) by mouth 2 (two) times daily with a meal. 60 tablet 0  . ceFAZolin (ANCEF) 2-4 GM/100ML-% IVPB Inject 100 mLs (2 g total) into the vein Every Tuesday,Thursday,and Saturday with dialysis. For 2 weeks, till July 11 5 each 0  . cinacalcet (SENSIPAR) 60 MG tablet Take 60 mg by mouth daily.    . clonazePAM (KLONOPIN) 1 MG tablet Take 0.5 tablets (0.5 mg total) by mouth 2 (two) times daily as needed for anxiety. 30 tablet 0  . colchicine 0.6 MG tablet Take 0.6 mg by mouth 3 (three) times daily.     Marland Kitchen gabapentin (NEURONTIN) 300 MG capsule Take 1 capsule (300 mg total) by mouth at bedtime.    . insulin glargine (LANTUS) 100 UNIT/ML injection Inject 0.35 mLs (35 Units total) into the skin at bedtime. (Patient taking differently: Inject 35 Units into the skin at bedtime. ) 10 mL 11  . lanthanum (FOSRENOL) 1000 MG chewable tablet Chew 1,000 mg by mouth 3 (three) times daily with meals.     . multivitamin (RENA-VIT) TABS tablet Take 1 tablet by mouth daily.   3  . omeprazole (PRILOSEC) 20 MG capsule Take 20 mg by mouth 2 (two) times daily before a meal.     . Oxycodone HCl 10 MG TABS Take 1 tablet (10 mg total) by mouth 2 (two) times daily as needed (pain). 20 tablet 0  . polyethylene glycol  (MIRALAX / GLYCOLAX) packet Take 17 g by mouth daily as needed for mild constipation. 14 each 0  . warfarin (COUMADIN) 10 MG tablet Take 10 mg by mouth daily.     No current facility-administered medications for this visit.      Allergies  Allergen Reactions  . Aspirin Other (See Comments)    Reaction:  GI upset     REVIEW OF SYSTEMS:  (Positives checked otherwise negative)  CARDIOVASCULAR:   [ ]  chest pain,  [ ]  chest pressure,  [ ]  palpitations,  [ ]   shortness of breath when laying flat,  [ ]  shortness of breath with exertion,   [ ]  pain in feet when walking,  [ ]  pain in feet when laying flat, [ ]  history of blood clot in veins (DVT),  [ ]  history of phlebitis,  [ ]  swelling in legs,  [ ]  varicose veins  PULMONARY:   [ ]  productive cough,  [ ]  asthma,  [ ]  wheezing  NEUROLOGIC:   [ ]  weakness in arms or legs,  [ ]  numbness in arms or legs,  [ ]  difficulty speaking or slurred speech,  [ ]  temporary loss of vision in one eye,  [ ]  dizziness  HEMATOLOGIC:   [ ]  bleeding problems,  [ ]  problems with blood clotting too easily  MUSCULOSKEL:   [ ]  joint pain, [ ]  joint swelling  GASTROINTEST:   [ ]  vomiting blood,  [ ]  blood in stool     GENITOURINARY:   [ ]  burning with urination,  [ ]  blood in urine  PSYCHIATRIC:   [ ]  history of major depression  INTEGUMENTARY:   [ ]  rashes,  [ ]  ulcers  CONSTITUTIONAL:   [ ]  fever,  [ ]  chills     Physical Examination  Vitals:   05/08/16 1112  BP: 122/64  Pulse: (!) 102  SpO2: 90%  Weight: (!) 388 lb (176 kg)  Height: 5\' 11"  (1.803 m)   Body mass index is 54.12 kg/m.  General: awake briefly then becomes drowsy, morbidly obese  Pulmonary: Sym exp, good air movt, BLL rales, mild wheezing  Cardiac: RR, tachycardia, Nl S1, S2, no Murmurs, rubs or gallops  Vascular: Vessel Right Left  Radial Not Palpable Faintly Palpable  Ulnar Not Palpable Not Palpable  Brachial Faintly Palpable Palpable    Gastrointestinal: soft, morbidly obese, no TTP, -G/R  Musculoskeletal: wheel chair bound, R hand digit amputations healed, limited hand grip, L arm incisions healed  Neurologic: Pain and light touch intact in extremities , Motor exam as listed above   Medical Decision Making  Shane Hamilton is a 50 y.o. 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  I suspect his super morbidity is a substantial component of this.  I will refer him to Pulmonology for further evaluation and optimization in hopes he can clear the Anesthesia hurdles  Minimally, he is going to need: fibrin sheath angioplasty and R femoral TDC exchange in the short term.  The patient prefers to have to this done on a Friday, so I will have to schedule this with one of my partners.  Eventually he is going to need: L axillary exploration, L central venogram, possible hybrid Gore AVG placement if he can clear Anesthesia.   Leonides SakeBrian Chantz Montefusco, MD, FACS [facility] [place] Office: [phone] Pager: [phone]

## 2016-05-15 NOTE — Transfer of Care (Signed)
Immediate Anesthesia Transfer of Care Note  Patient: Shane Hamilton  Procedure(s) Performed: Procedure(s): EXCHANGE OF A RIGHT FEMORAL DIALYSIS CATHETER (Right) FIBRIN SHEATH ANGIOPLASTY (Right)  Patient Location: PACU  Anesthesia Type:MAC  Level of Consciousness: awake, alert , oriented and patient cooperative  Airway & Oxygen Therapy: Patient Spontanous Breathing  Post-op Assessment: Report given to RN, Post -op Vital signs reviewed and stable and Patient moving all extremities  Post vital signs: Reviewed and stable  Last Vitals:  Vitals:   05/15/16 0834 05/15/16 1155  BP: (!) 158/95   Pulse: (!) 106 (!) (P) 102  Resp: 20 (P) 17  Temp: 36.8 C     Last Pain:  Vitals:   05/15/16 0834  TempSrc: Oral      Patients Stated Pain Goal: 4 (05/15/16 0918)  Complications: No apparent anesthesia complications

## 2016-05-15 NOTE — Anesthesia Postprocedure Evaluation (Signed)
Anesthesia Post Note  Patient: Shane Hamilton  Procedure(s) Performed: Procedure(s) (LRB): EXCHANGE OF A RIGHT FEMORAL DIALYSIS CATHETER (Right) FIBRIN SHEATH ANGIOPLASTY (Right)  Patient location during evaluation: PACU Anesthesia Type: MAC Level of consciousness: awake, awake and alert, oriented and patient cooperative Pain management: pain level controlled Vital Signs Assessment: post-procedure vital signs reviewed and stable Respiratory status: spontaneous breathing and respiratory function stable Cardiovascular status: blood pressure returned to baseline, stable and tachycardic Postop Assessment: adequate PO intake and no signs of nausea or vomiting Anesthetic complications: no    Last Vitals:  Vitals:   05/15/16 0834 05/15/16 1155  BP: (!) 158/95 137/83  Pulse: (!) 106 (!) 102  Resp: 20 17  Temp: 36.8 C 36.6 C    Last Pain:  Vitals:   05/15/16 0834  TempSrc: Oral                 Neah Sporrer, Cristy FriedlanderVALERIE M

## 2016-05-15 NOTE — Op Note (Signed)
    OPERATIVE REPORT  DATE OF SURGERY: 05/15/2016  PATIENT: Shane MinaReginald D Pohlmann, 50 y.o. male MRN: [id number]  DOB: 05/23/1966  PRE-OPERATIVE DIAGNOSIS: End-stage renal disease with poorly functioning right femoral hemodialysis catheter  POST-OPERATIVE DIAGNOSIS:  Same  PROCEDURE: Exchange of right femoral hemodialysis catheter and angioplasty of fibrin sheath  SURGEON:  Gretta Beganodd Gilad Dugger, M.D.  PHYSICIAN ASSISTANT: Nurse  ANESTHESIA:  Local  EBL: Minimal ml  Total I/O In: 200 [I.V.:200] Out: 10 [Blood:10]  BLOOD ADMINISTERED: None  DRAINS: None  SPECIMEN: None  COUNTS CORRECT:  YES  PLAN OF CARE: PACU   PATIENT DISPOSITION:  PACU - hemodynamically stable  PROCEDURE DETAILS: Patient was taken to the operating placed supine position where the area of the right groin prepped and draped in usual sterile fashion the existing catheter was also prepped. The catheter was occluded with a hemostat and the catheter was transected with scissors. A guidewire was passed through the wire up to the level of the right atrium and this was confirmed with fluoroscopy. Catheter have been withdrawn back into the level of the L3-4 prior to division of this in the injection through this showed a fibrin sheath around the catheter site in the mid abdomen did not appear to be at the level of the diaphragm. The catheter was removed in its entirety and a 12 mm x 40 mm Plastic balloon was positioned over the guidewire and the sheath was dilated in the upper abdomen. This was at the level just at the diaphragm and just below the diaphragm. Next separate incision was made at the femoral vein entry site and the guidewire was identified at this level. There was some kinking in the guidewire and an endhole catheter was placed over the guidewire and the guidewire was exchanged. The guidewire was brought out through this incision at the groin and the hemostatic sheath was positioned over this. The dilator was removed and a  new 55 cm catheter was passed through the sheath and the sheath was removed. This was positioned at the level of the right atrium. Separate incision was made using local anesthesia over the anterior thigh and a tunnel was created from this entry site to the underside into the vein. The catheter was brought to the subcutaneous tunnel taking care not to kink this. The 2 lm ports were attached in both lumens flushed and aspirated easily and were locked with 1000 unit per cc heparin. The catheter was secured to the skin with 3-0 nylon stitch and the entry site was closed with a 3 or septic or Vicryl stitch.   Gretta Beganodd Teena Mangus, M.D. 05/15/2016 11:48 AM

## 2016-05-16 DIAGNOSIS — E1129 Type 2 diabetes mellitus with other diabetic kidney complication: Secondary | ICD-10-CM | POA: Diagnosis not present

## 2016-05-16 DIAGNOSIS — D631 Anemia in chronic kidney disease: Secondary | ICD-10-CM | POA: Diagnosis not present

## 2016-05-16 DIAGNOSIS — D509 Iron deficiency anemia, unspecified: Secondary | ICD-10-CM | POA: Diagnosis not present

## 2016-05-16 DIAGNOSIS — D689 Coagulation defect, unspecified: Secondary | ICD-10-CM | POA: Diagnosis not present

## 2016-05-16 DIAGNOSIS — N186 End stage renal disease: Secondary | ICD-10-CM | POA: Diagnosis not present

## 2016-05-18 ENCOUNTER — Encounter (HOSPITAL_COMMUNITY): Payer: Self-pay | Admitting: Vascular Surgery

## 2016-05-19 DIAGNOSIS — D509 Iron deficiency anemia, unspecified: Secondary | ICD-10-CM | POA: Diagnosis not present

## 2016-05-19 DIAGNOSIS — D631 Anemia in chronic kidney disease: Secondary | ICD-10-CM | POA: Diagnosis not present

## 2016-05-19 DIAGNOSIS — D689 Coagulation defect, unspecified: Secondary | ICD-10-CM | POA: Diagnosis not present

## 2016-05-19 DIAGNOSIS — N186 End stage renal disease: Secondary | ICD-10-CM | POA: Diagnosis not present

## 2016-05-19 DIAGNOSIS — E1129 Type 2 diabetes mellitus with other diabetic kidney complication: Secondary | ICD-10-CM | POA: Diagnosis not present

## 2016-05-21 DIAGNOSIS — N186 End stage renal disease: Secondary | ICD-10-CM | POA: Diagnosis not present

## 2016-05-21 DIAGNOSIS — D509 Iron deficiency anemia, unspecified: Secondary | ICD-10-CM | POA: Diagnosis not present

## 2016-05-21 DIAGNOSIS — E1129 Type 2 diabetes mellitus with other diabetic kidney complication: Secondary | ICD-10-CM | POA: Diagnosis not present

## 2016-05-21 DIAGNOSIS — D631 Anemia in chronic kidney disease: Secondary | ICD-10-CM | POA: Diagnosis not present

## 2016-05-21 DIAGNOSIS — D689 Coagulation defect, unspecified: Secondary | ICD-10-CM | POA: Diagnosis not present

## 2016-05-22 DIAGNOSIS — D689 Coagulation defect, unspecified: Secondary | ICD-10-CM | POA: Diagnosis not present

## 2016-05-22 DIAGNOSIS — D631 Anemia in chronic kidney disease: Secondary | ICD-10-CM | POA: Diagnosis not present

## 2016-05-22 DIAGNOSIS — E1129 Type 2 diabetes mellitus with other diabetic kidney complication: Secondary | ICD-10-CM | POA: Diagnosis not present

## 2016-05-22 DIAGNOSIS — N186 End stage renal disease: Secondary | ICD-10-CM | POA: Diagnosis not present

## 2016-05-22 DIAGNOSIS — D509 Iron deficiency anemia, unspecified: Secondary | ICD-10-CM | POA: Diagnosis not present

## 2016-05-23 DIAGNOSIS — D631 Anemia in chronic kidney disease: Secondary | ICD-10-CM | POA: Diagnosis not present

## 2016-05-23 DIAGNOSIS — D689 Coagulation defect, unspecified: Secondary | ICD-10-CM | POA: Diagnosis not present

## 2016-05-23 DIAGNOSIS — N186 End stage renal disease: Secondary | ICD-10-CM | POA: Diagnosis not present

## 2016-05-23 DIAGNOSIS — D509 Iron deficiency anemia, unspecified: Secondary | ICD-10-CM | POA: Diagnosis not present

## 2016-05-23 DIAGNOSIS — E1129 Type 2 diabetes mellitus with other diabetic kidney complication: Secondary | ICD-10-CM | POA: Diagnosis not present

## 2016-05-26 DIAGNOSIS — D509 Iron deficiency anemia, unspecified: Secondary | ICD-10-CM | POA: Diagnosis not present

## 2016-05-26 DIAGNOSIS — D689 Coagulation defect, unspecified: Secondary | ICD-10-CM | POA: Diagnosis not present

## 2016-05-26 DIAGNOSIS — N186 End stage renal disease: Secondary | ICD-10-CM | POA: Diagnosis not present

## 2016-05-26 DIAGNOSIS — D631 Anemia in chronic kidney disease: Secondary | ICD-10-CM | POA: Diagnosis not present

## 2016-05-26 DIAGNOSIS — E1129 Type 2 diabetes mellitus with other diabetic kidney complication: Secondary | ICD-10-CM | POA: Diagnosis not present

## 2016-05-28 ENCOUNTER — Encounter (HOSPITAL_COMMUNITY): Payer: Self-pay | Admitting: Interventional Radiology

## 2016-05-28 ENCOUNTER — Other Ambulatory Visit (HOSPITAL_COMMUNITY): Payer: Self-pay | Admitting: Nephrology

## 2016-05-28 ENCOUNTER — Ambulatory Visit (HOSPITAL_COMMUNITY)
Admission: RE | Admit: 2016-05-28 | Discharge: 2016-05-28 | Disposition: A | Discharge status: Home / Self Care | Payer: Medicare Other | Source: Ambulatory Visit | Attending: Nephrology | Admitting: Nephrology

## 2016-05-28 DIAGNOSIS — Z992 Dependence on renal dialysis: Secondary | ICD-10-CM

## 2016-05-28 DIAGNOSIS — N186 End stage renal disease: Secondary | ICD-10-CM

## 2016-05-28 DIAGNOSIS — Z7901 Long term (current) use of anticoagulants: Secondary | ICD-10-CM | POA: Diagnosis not present

## 2016-05-28 DIAGNOSIS — Z4901 Encounter for fitting and adjustment of extracorporeal dialysis catheter: Secondary | ICD-10-CM | POA: Diagnosis not present

## 2016-05-28 DIAGNOSIS — T82524A Displacement of infusion catheter, initial encounter: Secondary | ICD-10-CM | POA: Diagnosis not present

## 2016-05-28 DIAGNOSIS — Y831 Surgical operation with implant of artificial internal device as the cause of abnormal reaction of the patient, or of later complication, without mention of misadventure at the time of the procedure: Secondary | ICD-10-CM | POA: Insufficient documentation

## 2016-05-28 HISTORY — PX: IR GENERIC HISTORICAL: IMG1180011

## 2016-05-28 MED ORDER — HEPARIN SODIUM (PORCINE) 1000 UNIT/ML IJ SOLN
INTRAMUSCULAR | Status: AC
  Administered 2016-05-28: 30000 [IU]
  Filled 2016-05-28: qty 1

## 2016-05-28 MED ORDER — CEFAZOLIN IN D5W 1 GM/50ML IV SOLN
INTRAVENOUS | Status: DC
Start: 2016-05-28 — End: 2016-05-29
  Filled 2016-05-28: qty 50

## 2016-05-28 MED ORDER — LIDOCAINE HCL 1 % IJ SOLN
INTRAMUSCULAR | Status: AC
Start: 2016-05-28 — End: 2016-05-28
  Administered 2016-05-28: 20 mL
  Filled 2016-05-28: qty 20

## 2016-05-28 MED ORDER — CEFAZOLIN SODIUM-DEXTROSE 2-4 GM/100ML-% IV SOLN
INTRAVENOUS | Status: AC
  Filled 2016-05-28: qty 100

## 2016-05-28 MED ORDER — DEXTROSE 5 % IV SOLN: 3.0000 g | Freq: Once | INTRAVENOUS | Status: DC

## 2016-05-28 MED ORDER — CHLORHEXIDINE GLUCONATE 4 % EX LIQD
CUTANEOUS | Status: AC
  Administered 2016-05-28: 16:00:00
  Filled 2016-05-28: qty 15

## 2016-05-28 NOTE — Procedures (Signed)
S/P RT FEMORAL HD CATH EXCHG  NO COMP TIP SVCRA READY FOR USE FULL REPORT IN PACS

## 2016-05-28 NOTE — Progress Notes (Signed)
Referring Physician(s): Dunham,Cynthia  Supervising Physician: Ruel Favors  Patient Status:  Outpatient  Chief Complaint:  Tunneled dialysis catheter- cuff exposure  Subjective:  Noted cuff exposure while at dialysis today Sent directly to IR for exchange Last use 8/22-- no issues   Allergies: Aspirin  Medications: Prior to Admission medications   Medication Sig Start Date End Date Taking? Authorizing Provider  allopurinol (ZYLOPRIM) 300 MG tablet Take 300 mg by mouth 2 (two) times daily.    Historical Provider, MD  atorvastatin (LIPITOR) 10 MG tablet Take 10 mg by mouth daily.    Historical Provider, MD  BD PEN NEEDLE NANO U/F 32G X 4 MM MISC  07/12/15   Historical Provider, MD  calcium acetate (PHOSLO) 667 MG capsule Take 2,001 mg by mouth 3 (three) times daily with meals.     Historical Provider, MD  carvedilol (COREG) 3.125 MG tablet Take 1 tablet (3.125 mg total) by mouth 2 (two) times daily with a meal. 06/26/15   Altamese Dilling, MD  cinacalcet (SENSIPAR) 60 MG tablet Take 60 mg by mouth daily.    Historical Provider, MD  clonazePAM (KLONOPIN) 1 MG tablet Take 0.5 tablets (0.5 mg total) by mouth 2 (two) times daily as needed for anxiety. 04/01/16   Zannie Cove, MD  colchicine 0.6 MG tablet Take 0.6 mg by mouth 3 (three) times daily.     Historical Provider, MD  gabapentin (NEURONTIN) 300 MG capsule Take 1 capsule (300 mg total) by mouth at bedtime. Patient taking differently: Take 300 mg by mouth 3 (three) times daily.  04/01/16   Zannie Cove, MD  insulin glargine (LANTUS) 100 UNIT/ML injection Inject 0.35 mLs (35 Units total) into the skin at bedtime. Patient taking differently: Inject 35 Units into the skin at bedtime. Pt takes 70 units at bedtime 06/26/15   Altamese Dilling, MD  lanthanum (FOSRENOL) 1000 MG chewable tablet Chew 1,000 mg by mouth 3 (three) times daily with meals.     Historical Provider, MD  multivitamin (RENA-VIT) TABS tablet Take 1  tablet by mouth daily.  12/05/14   Historical Provider, MD  NOVOLOG FLEXPEN 100 UNIT/ML FlexPen Inject 35 Units as directed 3 (three) times daily with meals. 04/20/16   Historical Provider, MD  omeprazole (PRILOSEC) 20 MG capsule Take 20 mg by mouth 2 (two) times daily before a meal.     Historical Provider, MD  polyethylene glycol (MIRALAX / GLYCOLAX) packet Take 17 g by mouth daily as needed for mild constipation. 04/01/16   Zannie Cove, MD  warfarin (COUMADIN) 10 MG tablet Take 10 mg by mouth daily.    Historical Provider, MD     Vital Signs: There were no vitals taken for this visit.  Physical Exam  Constitutional: He is oriented to person, place, and time.  Cardiovascular: Normal rate and regular rhythm.   Neurological: He is alert and oriented to person, place, and time.  Skin: Skin is warm and dry.  Dialysis catheter cuff exposure  Psychiatric: He has a normal mood and affect. His behavior is normal. Judgment and thought content normal.  Nursing note and vitals reviewed.   Imaging: No results found.  Labs:  CBC:  Recent Labs  03/26/16 1235 03/27/16 0315 03/29/16 0444 03/31/16 1250 03/31/16 1600 05/15/16 0943  WBC 17.2* 13.5* 10.5  --  12.1*  --   HGB 10.5* 9.5* 8.7* 10.5* 8.7* 13.3  HCT 34.3* 32.0* 28.7* 31.0* 28.4* 39.0  PLT 226 205 193  --  254  --  COAGS:  Recent Labs  08/01/15 0808  03/04/16 1541 03/05/16 0521 03/27/16 0032 03/29/16 0444 05/15/16 0821  INR 1.48  < > 1.36 1.33 3.08* 1.44 1.37  APTT 33  --  32  --  63*  --  34  < > = values in this interval not displayed.  BMP:  Recent Labs  03/29/16 0444 03/30/16 0906 03/31/16 1250 03/31/16 1615 05/15/16 0846 05/15/16 0943  NA 132* 133* 133* 133* 134* 138  K 3.7 3.7 4.2 4.3 5.8* 6.2*  CL 96* 94*  --  95* 99*  --   CO2 26 24  --  25 23  --   GLUCOSE 189* 209* 144* 148* 176* 177*  BUN 27* 50*  --  71* 64*  --   CALCIUM 8.2* 8.1*  --  8.2* 8.9  --   CREATININE 6.82* 9.94*  --  13.10*  11.50*  --   GFRNONAA 8* 5*  --  4* 5*  --   GFRAA 10* 6*  --  4* 5*  --     LIVER FUNCTION TESTS:  Recent Labs  03/04/16 1541 03/26/16 1235 03/28/16 0546 03/29/16 0444 03/30/16 0906 03/31/16 1615  BILITOT 1.0 0.9  --   --   --   --   AST 42* 27  --   --   --   --   ALT 46 24  --   --   --   --   ALKPHOS 157* 138*  --   --   --   --   PROT 7.7 8.4*  --   --   --   --   ALBUMIN 3.3* 3.7 2.9* 2.8* 2.7* 2.7*    Assessment and Plan:  Scheduled for tunneled dialysis catheter exchange Cuff exposure Pt aware of procedure benefits and risks including but not limited to Infection; bleeding; vessel damage Agreeable to proceed Consent signed and in chart  Electronically Signed: Suhaan Perleberg A 05/28/2016, 1:01 PM   I spent a total of 25 Minutes at the the patient's bedside AND on the patient's hospital floor or unit, greater than 50% of which was counseling/coordinating care for dialysis catheter exchange

## 2016-05-29 DIAGNOSIS — E1129 Type 2 diabetes mellitus with other diabetic kidney complication: Secondary | ICD-10-CM | POA: Diagnosis not present

## 2016-05-29 DIAGNOSIS — D509 Iron deficiency anemia, unspecified: Secondary | ICD-10-CM | POA: Diagnosis not present

## 2016-05-29 DIAGNOSIS — N186 End stage renal disease: Secondary | ICD-10-CM | POA: Diagnosis not present

## 2016-05-29 DIAGNOSIS — D689 Coagulation defect, unspecified: Secondary | ICD-10-CM | POA: Diagnosis not present

## 2016-05-29 DIAGNOSIS — D631 Anemia in chronic kidney disease: Secondary | ICD-10-CM | POA: Diagnosis not present

## 2016-05-30 DIAGNOSIS — E1129 Type 2 diabetes mellitus with other diabetic kidney complication: Secondary | ICD-10-CM | POA: Diagnosis not present

## 2016-05-30 DIAGNOSIS — Z5181 Encounter for therapeutic drug level monitoring: Secondary | ICD-10-CM | POA: Diagnosis not present

## 2016-05-30 DIAGNOSIS — I82599 Chronic embolism and thrombosis of other specified deep vein of unspecified lower extremity: Secondary | ICD-10-CM | POA: Diagnosis not present

## 2016-05-30 DIAGNOSIS — Z7901 Long term (current) use of anticoagulants: Secondary | ICD-10-CM | POA: Diagnosis not present

## 2016-05-30 DIAGNOSIS — D631 Anemia in chronic kidney disease: Secondary | ICD-10-CM | POA: Diagnosis not present

## 2016-05-30 DIAGNOSIS — D509 Iron deficiency anemia, unspecified: Secondary | ICD-10-CM | POA: Diagnosis not present

## 2016-05-30 DIAGNOSIS — D689 Coagulation defect, unspecified: Secondary | ICD-10-CM | POA: Diagnosis not present

## 2016-05-30 DIAGNOSIS — N186 End stage renal disease: Secondary | ICD-10-CM | POA: Diagnosis not present

## 2016-06-02 DIAGNOSIS — D689 Coagulation defect, unspecified: Secondary | ICD-10-CM | POA: Diagnosis not present

## 2016-06-02 DIAGNOSIS — D631 Anemia in chronic kidney disease: Secondary | ICD-10-CM | POA: Diagnosis not present

## 2016-06-02 DIAGNOSIS — E1129 Type 2 diabetes mellitus with other diabetic kidney complication: Secondary | ICD-10-CM | POA: Diagnosis not present

## 2016-06-02 DIAGNOSIS — D509 Iron deficiency anemia, unspecified: Secondary | ICD-10-CM | POA: Diagnosis not present

## 2016-06-02 DIAGNOSIS — N186 End stage renal disease: Secondary | ICD-10-CM | POA: Diagnosis not present

## 2016-06-04 DIAGNOSIS — D689 Coagulation defect, unspecified: Secondary | ICD-10-CM | POA: Diagnosis not present

## 2016-06-04 DIAGNOSIS — D509 Iron deficiency anemia, unspecified: Secondary | ICD-10-CM | POA: Diagnosis not present

## 2016-06-04 DIAGNOSIS — N186 End stage renal disease: Secondary | ICD-10-CM | POA: Diagnosis not present

## 2016-06-04 DIAGNOSIS — Z992 Dependence on renal dialysis: Secondary | ICD-10-CM | POA: Diagnosis not present

## 2016-06-04 DIAGNOSIS — E1129 Type 2 diabetes mellitus with other diabetic kidney complication: Secondary | ICD-10-CM | POA: Diagnosis not present

## 2016-06-04 DIAGNOSIS — D631 Anemia in chronic kidney disease: Secondary | ICD-10-CM | POA: Diagnosis not present

## 2016-06-06 DIAGNOSIS — N186 End stage renal disease: Secondary | ICD-10-CM | POA: Diagnosis not present

## 2016-06-06 DIAGNOSIS — E1129 Type 2 diabetes mellitus with other diabetic kidney complication: Secondary | ICD-10-CM | POA: Diagnosis not present

## 2016-06-06 DIAGNOSIS — T8249XD Other complication of vascular dialysis catheter, subsequent encounter: Secondary | ICD-10-CM | POA: Diagnosis not present

## 2016-06-06 DIAGNOSIS — D509 Iron deficiency anemia, unspecified: Secondary | ICD-10-CM | POA: Diagnosis not present

## 2016-06-06 DIAGNOSIS — D631 Anemia in chronic kidney disease: Secondary | ICD-10-CM | POA: Diagnosis not present

## 2016-06-09 DIAGNOSIS — T8249XD Other complication of vascular dialysis catheter, subsequent encounter: Secondary | ICD-10-CM | POA: Diagnosis not present

## 2016-06-09 DIAGNOSIS — D631 Anemia in chronic kidney disease: Secondary | ICD-10-CM | POA: Diagnosis not present

## 2016-06-09 DIAGNOSIS — D509 Iron deficiency anemia, unspecified: Secondary | ICD-10-CM | POA: Diagnosis not present

## 2016-06-09 DIAGNOSIS — E1129 Type 2 diabetes mellitus with other diabetic kidney complication: Secondary | ICD-10-CM | POA: Diagnosis not present

## 2016-06-09 DIAGNOSIS — N186 End stage renal disease: Secondary | ICD-10-CM | POA: Diagnosis not present

## 2016-06-11 ENCOUNTER — Other Ambulatory Visit (HOSPITAL_COMMUNITY): Payer: Self-pay | Admitting: Nephrology

## 2016-06-11 ENCOUNTER — Ambulatory Visit (HOSPITAL_COMMUNITY)
Admission: RE | Admit: 2016-06-11 | Discharge: 2016-06-11 | Disposition: A | Discharge status: Home / Self Care | Payer: Medicare Other | Source: Ambulatory Visit | Attending: Nephrology | Admitting: Nephrology

## 2016-06-11 ENCOUNTER — Encounter (HOSPITAL_COMMUNITY): Payer: Self-pay

## 2016-06-11 DIAGNOSIS — Z86718 Personal history of other venous thrombosis and embolism: Secondary | ICD-10-CM | POA: Diagnosis not present

## 2016-06-11 DIAGNOSIS — M109 Gout, unspecified: Secondary | ICD-10-CM | POA: Diagnosis not present

## 2016-06-11 DIAGNOSIS — Z4901 Encounter for fitting and adjustment of extracorporeal dialysis catheter: Secondary | ICD-10-CM | POA: Diagnosis not present

## 2016-06-11 DIAGNOSIS — D649 Anemia, unspecified: Secondary | ICD-10-CM | POA: Diagnosis not present

## 2016-06-11 DIAGNOSIS — Z7901 Long term (current) use of anticoagulants: Secondary | ICD-10-CM | POA: Insufficient documentation

## 2016-06-11 DIAGNOSIS — K861 Other chronic pancreatitis: Secondary | ICD-10-CM | POA: Diagnosis not present

## 2016-06-11 DIAGNOSIS — D631 Anemia in chronic kidney disease: Secondary | ICD-10-CM | POA: Diagnosis not present

## 2016-06-11 DIAGNOSIS — T8249XD Other complication of vascular dialysis catheter, subsequent encounter: Secondary | ICD-10-CM | POA: Diagnosis not present

## 2016-06-11 DIAGNOSIS — G4733 Obstructive sleep apnea (adult) (pediatric): Secondary | ICD-10-CM | POA: Insufficient documentation

## 2016-06-11 DIAGNOSIS — Z833 Family history of diabetes mellitus: Secondary | ICD-10-CM | POA: Diagnosis not present

## 2016-06-11 DIAGNOSIS — E1122 Type 2 diabetes mellitus with diabetic chronic kidney disease: Secondary | ICD-10-CM | POA: Diagnosis not present

## 2016-06-11 DIAGNOSIS — F419 Anxiety disorder, unspecified: Secondary | ICD-10-CM | POA: Insufficient documentation

## 2016-06-11 DIAGNOSIS — N2581 Secondary hyperparathyroidism of renal origin: Secondary | ICD-10-CM | POA: Diagnosis not present

## 2016-06-11 DIAGNOSIS — Z8249 Family history of ischemic heart disease and other diseases of the circulatory system: Secondary | ICD-10-CM | POA: Diagnosis not present

## 2016-06-11 DIAGNOSIS — N186 End stage renal disease: Secondary | ICD-10-CM | POA: Insufficient documentation

## 2016-06-11 DIAGNOSIS — Z794 Long term (current) use of insulin: Secondary | ICD-10-CM | POA: Insufficient documentation

## 2016-06-11 DIAGNOSIS — E1129 Type 2 diabetes mellitus with other diabetic kidney complication: Secondary | ICD-10-CM | POA: Diagnosis not present

## 2016-06-11 DIAGNOSIS — I120 Hypertensive chronic kidney disease with stage 5 chronic kidney disease or end stage renal disease: Secondary | ICD-10-CM | POA: Diagnosis not present

## 2016-06-11 DIAGNOSIS — R569 Unspecified convulsions: Secondary | ICD-10-CM | POA: Diagnosis not present

## 2016-06-11 DIAGNOSIS — G43909 Migraine, unspecified, not intractable, without status migrainosus: Secondary | ICD-10-CM | POA: Diagnosis not present

## 2016-06-11 DIAGNOSIS — Z452 Encounter for adjustment and management of vascular access device: Secondary | ICD-10-CM | POA: Insufficient documentation

## 2016-06-11 DIAGNOSIS — D509 Iron deficiency anemia, unspecified: Secondary | ICD-10-CM | POA: Diagnosis not present

## 2016-06-11 DIAGNOSIS — K219 Gastro-esophageal reflux disease without esophagitis: Secondary | ICD-10-CM | POA: Insufficient documentation

## 2016-06-11 DIAGNOSIS — Z992 Dependence on renal dialysis: Secondary | ICD-10-CM

## 2016-06-11 DIAGNOSIS — I82599 Chronic embolism and thrombosis of other specified deep vein of unspecified lower extremity: Secondary | ICD-10-CM | POA: Diagnosis not present

## 2016-06-11 HISTORY — PX: IR GENERIC HISTORICAL: IMG1180011

## 2016-06-11 MED ORDER — LIDOCAINE HCL 1 % IJ SOLN
INTRAMUSCULAR | Status: DC | PRN
  Administered 2016-06-11: 10 mL

## 2016-06-11 MED ORDER — LIDOCAINE HCL 1 % IJ SOLN
INTRAMUSCULAR | Status: DC
Start: 2016-06-11 — End: 2016-06-12
  Filled 2016-06-11: qty 20

## 2016-06-11 MED ORDER — CHLORHEXIDINE GLUCONATE 4 % EX LIQD
CUTANEOUS | Status: DC
Start: 2016-06-11 — End: 2016-06-12
  Filled 2016-06-11: qty 15

## 2016-06-11 MED ORDER — CEFAZOLIN SODIUM-DEXTROSE 2-4 GM/100ML-% IV SOLN
INTRAVENOUS | Status: AC
  Administered 2016-06-11: 2000 mg
  Filled 2016-06-11: qty 100

## 2016-06-11 MED ORDER — HEPARIN SODIUM (PORCINE) 1000 UNIT/ML IJ SOLN
INTRAMUSCULAR | Status: DC | PRN
  Administered 2016-06-11: 1000 [IU] via INTRAVENOUS

## 2016-06-11 MED ORDER — CHLORHEXIDINE GLUCONATE 4 % EX LIQD
CUTANEOUS | Status: DC | PRN
  Administered 2016-06-11: 1 via TOPICAL

## 2016-06-11 MED ORDER — CEFAZOLIN (ANCEF) 1 G IV SOLR: 2.0000 g | Freq: Once | INTRAVENOUS | Status: DC

## 2016-06-11 MED ORDER — HEPARIN SODIUM (PORCINE) 1000 UNIT/ML IJ SOLN
INTRAMUSCULAR | Status: DC
Start: 2016-06-11 — End: 2016-06-12
  Filled 2016-06-11: qty 1

## 2016-06-11 NOTE — Procedures (Signed)
Exchange R fem tunneled HD cath under fluoro No complication No blood loss. [facility].

## 2016-06-11 NOTE — H&P (Signed)
Chief Complaint: ESRD on HD Tunneled HD cath with cuff exposed  Referring Physician(s): Dunham,Cynthia  Supervising Physician: Oley Balm  Patient Status: Outpatient  History of Present Illness: Shane Hamilton is a 50 y.o. male who is well known to our service for multiple HD catheter placements  He is here again today for exchange of his catheter due to the cuff being exposed.  He does take blood thinners (warfarin) for DVT. Dr. Deanne Coffer aware.  Past Medical History:  Diagnosis Date  . Abscess of right arm 06/17/2015  . Anemia   . Anxiety   . Arteriovenous fistula infection (HCC)   . Cardiomegaly   . Chronic anticoagulation   . Chronic pancreatitis (HCC)   . DVT (deep venous thrombosis) (HCC) 2011  . ESRD (end stage renal disease) (HCC)    [facility] [place]  . GERD (gastroesophageal reflux disease)   . Gout   . Headache    migraines  . Hypertension   . Kidney stones   . LOC (loss of consciousness)   . Morbid obesity (HCC)   . OSA (obstructive sleep apnea)    not using cpap  . Pneumonia   . Secondary hyperparathyroidism (HCC)   . Seizures (HCC)    pt denies  . Septic shock(785.52)   . Shortness of breath dyspnea    with exertion  . Type 2 diabetes mellitus, uncontrolled (HCC)         Past Surgical History:  Procedure Laterality Date  . AMPUTATION FINGER / THUMB Right   . ANGIOPLASTY ILLIAC ARTERY Right 05/15/2016   Procedure: FIBRIN SHEATH ANGIOPLASTY;  Surgeon: Larina Earthly, MD;  Location: [facility] OR;  Service: Vascular;  Laterality: Right;  . APPLICATION OF WOUND VAC Left 06/20/2015   Procedure: APPLICATION OF WOUND VAC;  Surgeon: Renford Dills, MD;  Location: [facility] ORS;  Service: Vascular;  Laterality: Left;  . AV FISTULA PLACEMENT    . AV FISTULA PLACEMENT Left 06/20/2015   Procedure: ARTERIOVENOUS (AV) FISTULA  ligation , excision;  Surgeon: Renford Dills, MD;  Location: [facility] ORS;  Service: Vascular;  Laterality: Left;  . EXCHANGE OF A  DIALYSIS CATHETER Left 11/13/2015   Procedure: EXCHANGE OF A DIALYSIS CATHETER- LEFT INTERNAL JUGULAR;  Surgeon: Nada Libman, MD;  Location: [facility] OR;  Service: Vascular;  Laterality: Left;  . EXCHANGE OF A DIALYSIS CATHETER Right 05/15/2016   Procedure: EXCHANGE OF A RIGHT FEMORAL DIALYSIS CATHETER;  Surgeon: Larina Earthly, MD;  Location: [facility] OR;  Service: Vascular;  Laterality: Right;  . I&D EXTREMITY Left 06/20/2015   Procedure: IRRIGATION AND DEBRIDEMENT EXTREMITY;  Surgeon: Renford Dills, MD;  Location: [facility] ORS;  Service: Vascular;  Laterality: Left;  . INSERTION OF DIALYSIS CATHETER N/A 06/25/2015   Procedure: place a tunnelled catheter for long term use;  Surgeon: Annice Needy, MD;  Location: [facility] ORS;  Service: Vascular;  Laterality: N/A;  . INSERTION OF DIALYSIS CATHETER Left 07/25/2015   Procedure: INSERTION OF LEFT INTERNAL JUGULAR DIALYSIS CATHETER;  Surgeon: Pryor Ochoa, MD;  Location: [facility] OR;  Service: Vascular;  Laterality: Left;  . INSERTION OF DIALYSIS CATHETER Left 08/01/2015   Procedure: REMOVAL OF DIALYSIS CATHETER; PLACEMNET OF NEW DIALYSIS CATHETER;  Surgeon: Pryor Ochoa, MD;  Location: [facility] OR;  Service: Vascular;  Laterality: Left;  . INSERTION OF DIALYSIS CATHETER Right 03/31/2016   Procedure: INSERTION OF DIALYSIS CATHETER RIGHT FEMORAL ;  Surgeon: Chuck Hint, MD;  Location: [facility] OR;  Service: Vascular;  Laterality: Right;  . IR GENERIC HISTORICAL  05/28/2016   IR FLUORO GUIDE CV LINE RIGHT 05/28/2016 Berdine Dance, MD [facility]-INTER[facility]  . PERIPHERAL VASCULAR CATHETERIZATION N/A 06/25/2015   Procedure: Dialysis/Perma Catheter Insertion;  Surgeon: Annice Needy, MD;  Location: [facility] INVASIVE CV LAB;  Service: Cardiovascular;  Laterality: N/A;  . PERIPHERAL VASCULAR CATHETERIZATION Bilateral 09/13/2015   Procedure: Upper Extremity Venography;  Surgeon: Sherren Kerns, MD;  Location: [facility] INVASIVE CV LAB;  Service: Cardiovascular;  Laterality: Bilateral;     Allergies: Aspirin  Medications: Prior to Admission medications   Medication Sig Start Date End Date Taking? Authorizing Provider  allopurinol (ZYLOPRIM) 300 MG tablet Take 300 mg by mouth 2 (two) times daily.    Historical Provider, MD  atorvastatin (LIPITOR) 10 MG tablet Take 10 mg by mouth daily.    Historical Provider, MD  BD PEN NEEDLE NANO U/F 32G X 4 MM MISC  07/12/15   Historical Provider, MD  calcium acetate (PHOSLO) 667 MG capsule Take 2,001 mg by mouth 3 (three) times daily with meals.     Historical Provider, MD  carvedilol (COREG) 3.125 MG tablet Take 1 tablet (3.125 mg total) by mouth 2 (two) times daily with a meal. 06/26/15   Altamese Dilling, MD  cinacalcet (SENSIPAR) 60 MG tablet Take 60 mg by mouth daily.    Historical Provider, MD  clonazePAM (KLONOPIN) 1 MG tablet Take 0.5 tablets (0.5 mg total) by mouth 2 (two) times daily as needed for anxiety. 04/01/16   Zannie Cove, MD  colchicine 0.6 MG tablet Take 0.6 mg by mouth 3 (three) times daily.     Historical Provider, MD  gabapentin (NEURONTIN) 300 MG capsule Take 1 capsule (300 mg total) by mouth at bedtime. Patient taking differently: Take 300 mg by mouth 3 (three) times daily.  04/01/16   Zannie Cove, MD  insulin glargine (LANTUS) 100 UNIT/ML injection Inject 0.35 mLs (35 Units total) into the skin at bedtime. Patient taking differently: Inject 35 Units into the skin at bedtime. Pt takes 70 units at bedtime 06/26/15   Altamese Dilling, MD  lanthanum (FOSRENOL) 1000 MG chewable tablet Chew 1,000 mg by mouth 3 (three) times daily with meals.     Historical Provider, MD  multivitamin (RENA-VIT) TABS tablet Take 1 tablet by mouth daily.  12/05/14   Historical Provider, MD  NOVOLOG FLEXPEN 100 UNIT/ML FlexPen Inject 35 Units as directed 3 (three) times daily with meals. 04/20/16   Historical Provider, MD  omeprazole (PRILOSEC) 20 MG capsule Take 20 mg by mouth 2 (two) times daily before a meal.     Historical  Provider, MD  polyethylene glycol (MIRALAX / GLYCOLAX) packet Take 17 g by mouth daily as needed for mild constipation. 04/01/16   Zannie Cove, MD  warfarin (COUMADIN) 10 MG tablet Take 10 mg by mouth daily.    Historical Provider, MD     Family History  Problem Relation Age of Onset  . Diabetes Mother   . Hypertension Mother   . Diabetes Father   . Hypertension Father   . Hypertension Brother     Social History   Social History  . Marital status: Married    Spouse name: N/A  . Number of children: N/A  . Years of education: N/A   Occupational History  . disabled     Social History Main Topics  . Smoking status: Never Smoker  . Smokeless tobacco: Never Used  . Alcohol use No  .  Drug use: No  . Sexual activity: Not Asked   Other Topics Concern  . None   Social History Narrative  . None    Review of Systems: A 12 point ROS discussed and pertinent positives are indicated in the HPI above.  All other systems are negative.  Review of Systems  Vital Signs: There were no vitals taken for this visit.  Physical Exam  Constitutional: He is oriented to person, place, and time.  Morbidly obese, NAD. Falling asleep while I talk to him, probably secondary to sleep apnea.  HENT:  Head: Normocephalic and atraumatic.  Cardiovascular: Normal rate and regular rhythm.   Pulmonary/Chest: Effort normal. No respiratory distress.  Scattered slight wheezes bilaterally  Abdominal: Soft. There is no tenderness.  Neurological: He is oriented to person, place, and time.  Skin: Skin is warm and dry.  Psychiatric: He has a normal mood and affect. His behavior is normal. Judgment and thought content normal.    Mallampati Score:  MD Evaluation Airway: WNL Heart: WNL Abdomen: WNL Chest/ Lungs: WNL Other Pertinent Findings: Morbidly obese ASA  Classification: 3 Mallampati/Airway Score: Two  Imaging: Ir Fluoro Guide Cv Line Right  Result Date: 05/28/2016 INDICATION: End-stage  renal disease, chronic right femoral HD catheter, exposed retention cuff at the skin site EXAM: IR RIGHT FLOURO GUIDE CV LINE MEDICATIONS: Ancef 3 gm IV ; The antibiotic was administered within an appropriate time interval prior to skin puncture. ANESTHESIA/SEDATION: None. The patient's level of consciousness and vital signs were monitored continuously by radiology nursing throughout the procedure under my direct supervision. FLUOROSCOPY TIME:  Fluoroscopy Time: 42 seconds (36 mGy). COMPLICATIONS: None immediate. PROCEDURE: Informed written consent was obtained from the patient after a thorough discussion of the procedural risks, benefits and alternatives. All questions were addressed. Maximal Sterile Barrier Technique was utilized including caps, mask, sterile gowns, sterile gloves, sterile drape, hand hygiene and skin antiseptic. A timeout was performed prior to the initiation of the procedure. Under sterile conditions and local anesthesia, the existing retracted right femoral HD catheter was cut and removed over a stiff Glidewire. A new 55 cm dialysis catheter was advanced over the glidewire with the tip positioned at the IVC RA junction. Images obtained for documentation. Blood aspirated easily followed by saline and heparin flushes. External caps applied. Catheter secured with a Prolene suture and a sterile dressing. No immediate complication. Access ready for use. IMPRESSION: Successful fluoroscopic exchange of the right femoral tunneled dialysis catheter. Tip IVC RA junction. Ready for use. Electronically Signed   By: Judie PetitM.  Shick M.D.   On: 05/28/2016 16:30   Dg Chest Port 1 View  Result Date: 05/15/2016 CLINICAL DATA:  50 year old male with a history of renal failure EXAM: PORTABLE CHEST 1 VIEW COMPARISON:  03/31/2016 FINDINGS: Low lung volumes accentuating the interstitium. Interlobular septal thickening. Cardiomediastinal silhouette unchanged. No pneumothorax or large pleural effusion. No confluent  airspace disease. IMPRESSION: Low lung volumes with no evidence of lobar pneumonia. Interstitial opacities may represent mild edema or chronic changes. Signed, Yvone NeuJaime S. Loreta AveWagner, DO [facility] Electronically Signed   By: Gilmer MorJaime  Wagner D.O.   On: 05/15/2016 13:31    Labs:  CBC:  Recent Labs  03/26/16 1235 03/27/16 0315 03/29/16 0444 03/31/16 1250 03/31/16 1600 05/15/16 0943  WBC 17.2* 13.5* 10.5  --  12.1*  --   HGB 10.5* 9.5* 8.7* 10.5* 8.7* 13.3  HCT 34.3* 32.0* 28.7* 31.0* 28.4* 39.0  PLT 226 205 193  --  254  --     COAGS:  Recent Labs  08/01/15 0808  03/04/16 1541 03/05/16 0521 03/27/16 0032 03/29/16 0444 05/15/16 0821  INR 1.48  < > 1.36 1.33 3.08* 1.44 1.37  APTT 33  --  32  --  63*  --  34  < > = values in this interval not displayed.  BMP:  Recent Labs  03/29/16 0444 03/30/16 0906 03/31/16 1250 03/31/16 1615 05/15/16 0846 05/15/16 0943  NA 132* 133* 133* 133* 134* 138  K 3.7 3.7 4.2 4.3 5.8* 6.2*  CL 96* 94*  --  95* 99*  --   CO2 26 24  --  25 23  --   GLUCOSE 189* 209* 144* 148* 176* 177*  BUN 27* 50*  --  71* 64*  --   CALCIUM 8.2* 8.1*  --  8.2* 8.9  --   CREATININE 6.82* 9.94*  --  13.10* 11.50*  --   GFRNONAA 8* 5*  --  4* 5*  --   GFRAA 10* 6*  --  4* 5*  --     LIVER FUNCTION TESTS:  Recent Labs  03/04/16 1541 03/26/16 1235 03/28/16 0546 03/29/16 0444 03/30/16 0906 03/31/16 1615  BILITOT 1.0 0.9  --   --   --   --   AST 42* 27  --   --   --   --   ALT 46 24  --   --   --   --   ALKPHOS 157* 138*  --   --   --   --   PROT 7.7 8.4*  --   --   --   --   ALBUMIN 3.3* 3.7 2.9* 2.8* 2.7* 2.7*    TUMOR MARKERS: No results for input(s): AFPTM, CEA, CA199, CHROMGRNA in the last 8760 hours.  Assessment and Plan:  ESRD on HD with cuff exposure on current tunneled catheter.  Will proceed with exchange of tunneled catheter today by Dr. Deanne Coffer  Risks and Benefits discussed  with the patient including, but not limited to bleeding, infection, or fibrin sheath development and need for additional procedures.  All of the patient's questions were answered, patient is agreeable to proceed. Consent signed and in chart.  Electronically Signed: Gwynneth Macleod PA-C 06/11/2016, 12:16 PM   I spent a total of  15 Minutes in face to face in clinical consultation, greater than 50% of which was counseling/coordinating care for tunneled HD catheter exchange

## 2016-06-12 DIAGNOSIS — E1129 Type 2 diabetes mellitus with other diabetic kidney complication: Secondary | ICD-10-CM | POA: Diagnosis not present

## 2016-06-12 DIAGNOSIS — N186 End stage renal disease: Secondary | ICD-10-CM | POA: Diagnosis not present

## 2016-06-12 DIAGNOSIS — D631 Anemia in chronic kidney disease: Secondary | ICD-10-CM | POA: Diagnosis not present

## 2016-06-12 DIAGNOSIS — T8249XD Other complication of vascular dialysis catheter, subsequent encounter: Secondary | ICD-10-CM | POA: Diagnosis not present

## 2016-06-12 DIAGNOSIS — D509 Iron deficiency anemia, unspecified: Secondary | ICD-10-CM | POA: Diagnosis not present

## 2016-06-13 DIAGNOSIS — T8249XD Other complication of vascular dialysis catheter, subsequent encounter: Secondary | ICD-10-CM | POA: Diagnosis not present

## 2016-06-13 DIAGNOSIS — E1129 Type 2 diabetes mellitus with other diabetic kidney complication: Secondary | ICD-10-CM | POA: Diagnosis not present

## 2016-06-13 DIAGNOSIS — D509 Iron deficiency anemia, unspecified: Secondary | ICD-10-CM | POA: Diagnosis not present

## 2016-06-13 DIAGNOSIS — D631 Anemia in chronic kidney disease: Secondary | ICD-10-CM | POA: Diagnosis not present

## 2016-06-13 DIAGNOSIS — N186 End stage renal disease: Secondary | ICD-10-CM | POA: Diagnosis not present

## 2016-06-16 DIAGNOSIS — N186 End stage renal disease: Secondary | ICD-10-CM | POA: Diagnosis not present

## 2016-06-16 DIAGNOSIS — D631 Anemia in chronic kidney disease: Secondary | ICD-10-CM | POA: Diagnosis not present

## 2016-06-16 DIAGNOSIS — E1129 Type 2 diabetes mellitus with other diabetic kidney complication: Secondary | ICD-10-CM | POA: Diagnosis not present

## 2016-06-16 DIAGNOSIS — D509 Iron deficiency anemia, unspecified: Secondary | ICD-10-CM | POA: Diagnosis not present

## 2016-06-16 DIAGNOSIS — T8249XD Other complication of vascular dialysis catheter, subsequent encounter: Secondary | ICD-10-CM | POA: Diagnosis not present

## 2016-06-18 DIAGNOSIS — T8249XD Other complication of vascular dialysis catheter, subsequent encounter: Secondary | ICD-10-CM | POA: Diagnosis not present

## 2016-06-18 DIAGNOSIS — D509 Iron deficiency anemia, unspecified: Secondary | ICD-10-CM | POA: Diagnosis not present

## 2016-06-18 DIAGNOSIS — N186 End stage renal disease: Secondary | ICD-10-CM | POA: Diagnosis not present

## 2016-06-18 DIAGNOSIS — E1129 Type 2 diabetes mellitus with other diabetic kidney complication: Secondary | ICD-10-CM | POA: Diagnosis not present

## 2016-06-18 DIAGNOSIS — D631 Anemia in chronic kidney disease: Secondary | ICD-10-CM | POA: Diagnosis not present

## 2016-06-19 DIAGNOSIS — D631 Anemia in chronic kidney disease: Secondary | ICD-10-CM | POA: Diagnosis not present

## 2016-06-19 DIAGNOSIS — D509 Iron deficiency anemia, unspecified: Secondary | ICD-10-CM | POA: Diagnosis not present

## 2016-06-19 DIAGNOSIS — N186 End stage renal disease: Secondary | ICD-10-CM | POA: Diagnosis not present

## 2016-06-19 DIAGNOSIS — T8249XD Other complication of vascular dialysis catheter, subsequent encounter: Secondary | ICD-10-CM | POA: Diagnosis not present

## 2016-06-19 DIAGNOSIS — E1129 Type 2 diabetes mellitus with other diabetic kidney complication: Secondary | ICD-10-CM | POA: Diagnosis not present

## 2016-06-20 DIAGNOSIS — N186 End stage renal disease: Secondary | ICD-10-CM | POA: Diagnosis not present

## 2016-06-20 DIAGNOSIS — D509 Iron deficiency anemia, unspecified: Secondary | ICD-10-CM | POA: Diagnosis not present

## 2016-06-20 DIAGNOSIS — T8249XD Other complication of vascular dialysis catheter, subsequent encounter: Secondary | ICD-10-CM | POA: Diagnosis not present

## 2016-06-20 DIAGNOSIS — D631 Anemia in chronic kidney disease: Secondary | ICD-10-CM | POA: Diagnosis not present

## 2016-06-20 DIAGNOSIS — E1129 Type 2 diabetes mellitus with other diabetic kidney complication: Secondary | ICD-10-CM | POA: Diagnosis not present

## 2016-06-23 DIAGNOSIS — D509 Iron deficiency anemia, unspecified: Secondary | ICD-10-CM | POA: Diagnosis not present

## 2016-06-23 DIAGNOSIS — D631 Anemia in chronic kidney disease: Secondary | ICD-10-CM | POA: Diagnosis not present

## 2016-06-23 DIAGNOSIS — E1129 Type 2 diabetes mellitus with other diabetic kidney complication: Secondary | ICD-10-CM | POA: Diagnosis not present

## 2016-06-23 DIAGNOSIS — N186 End stage renal disease: Secondary | ICD-10-CM | POA: Diagnosis not present

## 2016-06-23 DIAGNOSIS — T8249XD Other complication of vascular dialysis catheter, subsequent encounter: Secondary | ICD-10-CM | POA: Diagnosis not present

## 2016-06-24 DIAGNOSIS — N186 End stage renal disease: Secondary | ICD-10-CM | POA: Diagnosis not present

## 2016-06-24 DIAGNOSIS — H04129 Dry eye syndrome of unspecified lacrimal gland: Secondary | ICD-10-CM | POA: Diagnosis not present

## 2016-06-24 DIAGNOSIS — E1165 Type 2 diabetes mellitus with hyperglycemia: Secondary | ICD-10-CM | POA: Diagnosis not present

## 2016-06-24 DIAGNOSIS — Z794 Long term (current) use of insulin: Secondary | ICD-10-CM | POA: Diagnosis not present

## 2016-06-24 DIAGNOSIS — S81802A Unspecified open wound, left lower leg, initial encounter: Secondary | ICD-10-CM | POA: Diagnosis not present

## 2016-06-24 DIAGNOSIS — E1140 Type 2 diabetes mellitus with diabetic neuropathy, unspecified: Secondary | ICD-10-CM | POA: Diagnosis not present

## 2016-06-25 DIAGNOSIS — D509 Iron deficiency anemia, unspecified: Secondary | ICD-10-CM | POA: Diagnosis not present

## 2016-06-25 DIAGNOSIS — N186 End stage renal disease: Secondary | ICD-10-CM | POA: Diagnosis not present

## 2016-06-25 DIAGNOSIS — D631 Anemia in chronic kidney disease: Secondary | ICD-10-CM | POA: Diagnosis not present

## 2016-06-25 DIAGNOSIS — T8249XD Other complication of vascular dialysis catheter, subsequent encounter: Secondary | ICD-10-CM | POA: Diagnosis not present

## 2016-06-25 DIAGNOSIS — E1129 Type 2 diabetes mellitus with other diabetic kidney complication: Secondary | ICD-10-CM | POA: Diagnosis not present

## 2016-06-27 DIAGNOSIS — D631 Anemia in chronic kidney disease: Secondary | ICD-10-CM | POA: Diagnosis not present

## 2016-06-27 DIAGNOSIS — N186 End stage renal disease: Secondary | ICD-10-CM | POA: Diagnosis not present

## 2016-06-27 DIAGNOSIS — T8249XD Other complication of vascular dialysis catheter, subsequent encounter: Secondary | ICD-10-CM | POA: Diagnosis not present

## 2016-06-27 DIAGNOSIS — D509 Iron deficiency anemia, unspecified: Secondary | ICD-10-CM | POA: Diagnosis not present

## 2016-06-27 DIAGNOSIS — E1129 Type 2 diabetes mellitus with other diabetic kidney complication: Secondary | ICD-10-CM | POA: Diagnosis not present

## 2016-06-30 DIAGNOSIS — D631 Anemia in chronic kidney disease: Secondary | ICD-10-CM | POA: Diagnosis not present

## 2016-06-30 DIAGNOSIS — E1129 Type 2 diabetes mellitus with other diabetic kidney complication: Secondary | ICD-10-CM | POA: Diagnosis not present

## 2016-06-30 DIAGNOSIS — N186 End stage renal disease: Secondary | ICD-10-CM | POA: Diagnosis not present

## 2016-06-30 DIAGNOSIS — T8249XD Other complication of vascular dialysis catheter, subsequent encounter: Secondary | ICD-10-CM | POA: Diagnosis not present

## 2016-06-30 DIAGNOSIS — D509 Iron deficiency anemia, unspecified: Secondary | ICD-10-CM | POA: Diagnosis not present

## 2016-07-01 DIAGNOSIS — Z794 Long term (current) use of insulin: Secondary | ICD-10-CM | POA: Diagnosis not present

## 2016-07-01 DIAGNOSIS — Z5181 Encounter for therapeutic drug level monitoring: Secondary | ICD-10-CM | POA: Diagnosis not present

## 2016-07-01 DIAGNOSIS — Z89021 Acquired absence of right finger(s): Secondary | ICD-10-CM | POA: Diagnosis not present

## 2016-07-01 DIAGNOSIS — E1122 Type 2 diabetes mellitus with diabetic chronic kidney disease: Secondary | ICD-10-CM | POA: Diagnosis not present

## 2016-07-01 DIAGNOSIS — L97221 Non-pressure chronic ulcer of left calf limited to breakdown of skin: Secondary | ICD-10-CM | POA: Diagnosis not present

## 2016-07-01 DIAGNOSIS — E1140 Type 2 diabetes mellitus with diabetic neuropathy, unspecified: Secondary | ICD-10-CM | POA: Diagnosis not present

## 2016-07-01 DIAGNOSIS — Z86711 Personal history of pulmonary embolism: Secondary | ICD-10-CM | POA: Diagnosis not present

## 2016-07-01 DIAGNOSIS — G4733 Obstructive sleep apnea (adult) (pediatric): Secondary | ICD-10-CM | POA: Diagnosis not present

## 2016-07-01 DIAGNOSIS — L89322 Pressure ulcer of left buttock, stage 2: Secondary | ICD-10-CM | POA: Diagnosis not present

## 2016-07-01 DIAGNOSIS — Z89011 Acquired absence of right thumb: Secondary | ICD-10-CM | POA: Diagnosis not present

## 2016-07-01 DIAGNOSIS — Z992 Dependence on renal dialysis: Secondary | ICD-10-CM | POA: Diagnosis not present

## 2016-07-01 DIAGNOSIS — Z7901 Long term (current) use of anticoagulants: Secondary | ICD-10-CM | POA: Diagnosis not present

## 2016-07-01 DIAGNOSIS — D631 Anemia in chronic kidney disease: Secondary | ICD-10-CM | POA: Diagnosis not present

## 2016-07-01 DIAGNOSIS — N186 End stage renal disease: Secondary | ICD-10-CM | POA: Diagnosis not present

## 2016-07-01 DIAGNOSIS — Z86718 Personal history of other venous thrombosis and embolism: Secondary | ICD-10-CM | POA: Diagnosis not present

## 2016-07-02 DIAGNOSIS — D509 Iron deficiency anemia, unspecified: Secondary | ICD-10-CM | POA: Diagnosis not present

## 2016-07-02 DIAGNOSIS — L97221 Non-pressure chronic ulcer of left calf limited to breakdown of skin: Secondary | ICD-10-CM | POA: Diagnosis not present

## 2016-07-02 DIAGNOSIS — L89322 Pressure ulcer of left buttock, stage 2: Secondary | ICD-10-CM | POA: Diagnosis not present

## 2016-07-02 DIAGNOSIS — E1129 Type 2 diabetes mellitus with other diabetic kidney complication: Secondary | ICD-10-CM | POA: Diagnosis not present

## 2016-07-02 DIAGNOSIS — N186 End stage renal disease: Secondary | ICD-10-CM | POA: Diagnosis not present

## 2016-07-02 DIAGNOSIS — E1140 Type 2 diabetes mellitus with diabetic neuropathy, unspecified: Secondary | ICD-10-CM | POA: Diagnosis not present

## 2016-07-02 DIAGNOSIS — Z7901 Long term (current) use of anticoagulants: Secondary | ICD-10-CM | POA: Diagnosis not present

## 2016-07-02 DIAGNOSIS — T8249XD Other complication of vascular dialysis catheter, subsequent encounter: Secondary | ICD-10-CM | POA: Diagnosis not present

## 2016-07-02 DIAGNOSIS — Z5181 Encounter for therapeutic drug level monitoring: Secondary | ICD-10-CM | POA: Diagnosis not present

## 2016-07-02 DIAGNOSIS — D631 Anemia in chronic kidney disease: Secondary | ICD-10-CM | POA: Diagnosis not present

## 2016-07-02 DIAGNOSIS — E1122 Type 2 diabetes mellitus with diabetic chronic kidney disease: Secondary | ICD-10-CM | POA: Diagnosis not present

## 2016-07-02 DIAGNOSIS — I82599 Chronic embolism and thrombosis of other specified deep vein of unspecified lower extremity: Secondary | ICD-10-CM | POA: Diagnosis not present

## 2016-07-03 DIAGNOSIS — L97221 Non-pressure chronic ulcer of left calf limited to breakdown of skin: Secondary | ICD-10-CM | POA: Diagnosis not present

## 2016-07-03 DIAGNOSIS — E1140 Type 2 diabetes mellitus with diabetic neuropathy, unspecified: Secondary | ICD-10-CM | POA: Diagnosis not present

## 2016-07-03 DIAGNOSIS — D631 Anemia in chronic kidney disease: Secondary | ICD-10-CM | POA: Diagnosis not present

## 2016-07-03 DIAGNOSIS — L89322 Pressure ulcer of left buttock, stage 2: Secondary | ICD-10-CM | POA: Diagnosis not present

## 2016-07-03 DIAGNOSIS — E1122 Type 2 diabetes mellitus with diabetic chronic kidney disease: Secondary | ICD-10-CM | POA: Diagnosis not present

## 2016-07-03 DIAGNOSIS — N186 End stage renal disease: Secondary | ICD-10-CM | POA: Diagnosis not present

## 2016-07-04 DIAGNOSIS — N186 End stage renal disease: Secondary | ICD-10-CM | POA: Diagnosis not present

## 2016-07-04 DIAGNOSIS — Z992 Dependence on renal dialysis: Secondary | ICD-10-CM | POA: Diagnosis not present

## 2016-07-04 DIAGNOSIS — D631 Anemia in chronic kidney disease: Secondary | ICD-10-CM | POA: Diagnosis not present

## 2016-07-04 DIAGNOSIS — T8249XD Other complication of vascular dialysis catheter, subsequent encounter: Secondary | ICD-10-CM | POA: Diagnosis not present

## 2016-07-04 DIAGNOSIS — E1129 Type 2 diabetes mellitus with other diabetic kidney complication: Secondary | ICD-10-CM | POA: Diagnosis not present

## 2016-07-04 DIAGNOSIS — D509 Iron deficiency anemia, unspecified: Secondary | ICD-10-CM | POA: Diagnosis not present

## 2016-07-07 DIAGNOSIS — N186 End stage renal disease: Secondary | ICD-10-CM | POA: Diagnosis not present

## 2016-07-07 DIAGNOSIS — D509 Iron deficiency anemia, unspecified: Secondary | ICD-10-CM | POA: Diagnosis not present

## 2016-07-08 ENCOUNTER — Institutional Professional Consult (permissible substitution): Payer: Medicare Other | Admitting: Pulmonary Disease

## 2016-07-08 DIAGNOSIS — N186 End stage renal disease: Secondary | ICD-10-CM | POA: Diagnosis not present

## 2016-07-08 DIAGNOSIS — L97221 Non-pressure chronic ulcer of left calf limited to breakdown of skin: Secondary | ICD-10-CM | POA: Diagnosis not present

## 2016-07-08 DIAGNOSIS — D631 Anemia in chronic kidney disease: Secondary | ICD-10-CM | POA: Diagnosis not present

## 2016-07-08 DIAGNOSIS — L89322 Pressure ulcer of left buttock, stage 2: Secondary | ICD-10-CM | POA: Diagnosis not present

## 2016-07-08 DIAGNOSIS — E1122 Type 2 diabetes mellitus with diabetic chronic kidney disease: Secondary | ICD-10-CM | POA: Diagnosis not present

## 2016-07-08 DIAGNOSIS — E1140 Type 2 diabetes mellitus with diabetic neuropathy, unspecified: Secondary | ICD-10-CM | POA: Diagnosis not present

## 2016-07-09 DIAGNOSIS — D509 Iron deficiency anemia, unspecified: Secondary | ICD-10-CM | POA: Diagnosis not present

## 2016-07-09 DIAGNOSIS — N186 End stage renal disease: Secondary | ICD-10-CM | POA: Diagnosis not present

## 2016-07-09 DIAGNOSIS — I2782 Chronic pulmonary embolism: Secondary | ICD-10-CM | POA: Diagnosis not present

## 2016-07-11 DIAGNOSIS — D509 Iron deficiency anemia, unspecified: Secondary | ICD-10-CM | POA: Diagnosis not present

## 2016-07-11 DIAGNOSIS — N186 End stage renal disease: Secondary | ICD-10-CM | POA: Diagnosis not present

## 2016-07-14 DIAGNOSIS — N186 End stage renal disease: Secondary | ICD-10-CM | POA: Diagnosis not present

## 2016-07-14 DIAGNOSIS — D509 Iron deficiency anemia, unspecified: Secondary | ICD-10-CM | POA: Diagnosis not present

## 2016-07-16 DIAGNOSIS — N186 End stage renal disease: Secondary | ICD-10-CM | POA: Diagnosis not present

## 2016-07-16 DIAGNOSIS — I2782 Chronic pulmonary embolism: Secondary | ICD-10-CM | POA: Diagnosis not present

## 2016-07-16 DIAGNOSIS — D509 Iron deficiency anemia, unspecified: Secondary | ICD-10-CM | POA: Diagnosis not present

## 2016-07-17 DIAGNOSIS — L97221 Non-pressure chronic ulcer of left calf limited to breakdown of skin: Secondary | ICD-10-CM | POA: Diagnosis not present

## 2016-07-17 DIAGNOSIS — E1140 Type 2 diabetes mellitus with diabetic neuropathy, unspecified: Secondary | ICD-10-CM | POA: Diagnosis not present

## 2016-07-17 DIAGNOSIS — L89322 Pressure ulcer of left buttock, stage 2: Secondary | ICD-10-CM | POA: Diagnosis not present

## 2016-07-17 DIAGNOSIS — N186 End stage renal disease: Secondary | ICD-10-CM | POA: Diagnosis not present

## 2016-07-17 DIAGNOSIS — D631 Anemia in chronic kidney disease: Secondary | ICD-10-CM | POA: Diagnosis not present

## 2016-07-17 DIAGNOSIS — E1122 Type 2 diabetes mellitus with diabetic chronic kidney disease: Secondary | ICD-10-CM | POA: Diagnosis not present

## 2016-07-18 DIAGNOSIS — D509 Iron deficiency anemia, unspecified: Secondary | ICD-10-CM | POA: Diagnosis not present

## 2016-07-18 DIAGNOSIS — N186 End stage renal disease: Secondary | ICD-10-CM | POA: Diagnosis not present

## 2016-07-21 DIAGNOSIS — D509 Iron deficiency anemia, unspecified: Secondary | ICD-10-CM | POA: Diagnosis not present

## 2016-07-21 DIAGNOSIS — N186 End stage renal disease: Secondary | ICD-10-CM | POA: Diagnosis not present

## 2016-07-22 DIAGNOSIS — L97221 Non-pressure chronic ulcer of left calf limited to breakdown of skin: Secondary | ICD-10-CM | POA: Diagnosis not present

## 2016-07-22 DIAGNOSIS — E1122 Type 2 diabetes mellitus with diabetic chronic kidney disease: Secondary | ICD-10-CM | POA: Diagnosis not present

## 2016-07-22 DIAGNOSIS — D631 Anemia in chronic kidney disease: Secondary | ICD-10-CM | POA: Diagnosis not present

## 2016-07-22 DIAGNOSIS — L89322 Pressure ulcer of left buttock, stage 2: Secondary | ICD-10-CM | POA: Diagnosis not present

## 2016-07-22 DIAGNOSIS — N186 End stage renal disease: Secondary | ICD-10-CM | POA: Diagnosis not present

## 2016-07-22 DIAGNOSIS — E1140 Type 2 diabetes mellitus with diabetic neuropathy, unspecified: Secondary | ICD-10-CM | POA: Diagnosis not present

## 2016-07-23 DIAGNOSIS — D509 Iron deficiency anemia, unspecified: Secondary | ICD-10-CM | POA: Diagnosis not present

## 2016-07-23 DIAGNOSIS — N186 End stage renal disease: Secondary | ICD-10-CM | POA: Diagnosis not present

## 2016-07-24 DIAGNOSIS — D509 Iron deficiency anemia, unspecified: Secondary | ICD-10-CM | POA: Diagnosis not present

## 2016-07-24 DIAGNOSIS — N186 End stage renal disease: Secondary | ICD-10-CM | POA: Diagnosis not present

## 2016-07-25 DIAGNOSIS — N186 End stage renal disease: Secondary | ICD-10-CM | POA: Diagnosis not present

## 2016-07-25 DIAGNOSIS — D509 Iron deficiency anemia, unspecified: Secondary | ICD-10-CM | POA: Diagnosis not present

## 2016-07-28 DIAGNOSIS — N186 End stage renal disease: Secondary | ICD-10-CM | POA: Diagnosis not present

## 2016-07-28 DIAGNOSIS — D509 Iron deficiency anemia, unspecified: Secondary | ICD-10-CM | POA: Diagnosis not present

## 2016-07-29 DIAGNOSIS — E1140 Type 2 diabetes mellitus with diabetic neuropathy, unspecified: Secondary | ICD-10-CM | POA: Diagnosis not present

## 2016-07-29 DIAGNOSIS — L89322 Pressure ulcer of left buttock, stage 2: Secondary | ICD-10-CM | POA: Diagnosis not present

## 2016-07-29 DIAGNOSIS — L97221 Non-pressure chronic ulcer of left calf limited to breakdown of skin: Secondary | ICD-10-CM | POA: Diagnosis not present

## 2016-07-29 DIAGNOSIS — E1122 Type 2 diabetes mellitus with diabetic chronic kidney disease: Secondary | ICD-10-CM | POA: Diagnosis not present

## 2016-07-29 DIAGNOSIS — D631 Anemia in chronic kidney disease: Secondary | ICD-10-CM | POA: Diagnosis not present

## 2016-07-29 DIAGNOSIS — N186 End stage renal disease: Secondary | ICD-10-CM | POA: Diagnosis not present

## 2016-07-30 ENCOUNTER — Ambulatory Visit (INDEPENDENT_AMBULATORY_CARE_PROVIDER_SITE_OTHER): Payer: Medicare Other | Admitting: Pulmonary Disease

## 2016-07-30 ENCOUNTER — Encounter: Payer: Self-pay | Admitting: Pulmonary Disease

## 2016-07-30 VITALS — BP 136/72 | HR 95 | Ht 71.0 in | Wt >= 6400 oz

## 2016-07-30 DIAGNOSIS — E1129 Type 2 diabetes mellitus with other diabetic kidney complication: Secondary | ICD-10-CM | POA: Diagnosis not present

## 2016-07-30 DIAGNOSIS — G4733 Obstructive sleep apnea (adult) (pediatric): Secondary | ICD-10-CM

## 2016-07-30 DIAGNOSIS — D509 Iron deficiency anemia, unspecified: Secondary | ICD-10-CM | POA: Diagnosis not present

## 2016-07-30 DIAGNOSIS — Z5181 Encounter for therapeutic drug level monitoring: Secondary | ICD-10-CM | POA: Diagnosis not present

## 2016-07-30 DIAGNOSIS — I82599 Chronic embolism and thrombosis of other specified deep vein of unspecified lower extremity: Secondary | ICD-10-CM | POA: Diagnosis not present

## 2016-07-30 DIAGNOSIS — N186 End stage renal disease: Secondary | ICD-10-CM | POA: Diagnosis not present

## 2016-07-30 DIAGNOSIS — Z7901 Long term (current) use of anticoagulants: Secondary | ICD-10-CM | POA: Diagnosis not present

## 2016-07-30 NOTE — Patient Instructions (Addendum)
We will order a split night sleep study to evaluate for sleep apnea, hypoventilation syndrome. In the meantime we will order CPAP to prevent exacerbations. Will order CPAP 22.   Return after study for eval

## 2016-07-30 NOTE — Progress Notes (Signed)
Shane MinaReginald D Erb    [id number]    05/16/1966  Primary Care Physician:MORROW, Clifton CustardAARON, MD  Referring Physician: Farris HasAaron Morrow, MD [address] [place], [place] [address]  Chief complaint:  Consult for evaluation of dyspnea  HPI: Shane Hamilton is a 50 y.o. 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 He had admission in July 2017 for MSSA bacteremia and previous admissions for DKA, acute hypoxic, hypercarbic respiratory failure secondary to decompensated heart failure, cor pulmonale related to OSA. He was intubated for permissive increasing peak CO2. The plan was to get sleep study as an outpatient but this cannot be done as he got readmitted. He is not on any therapy right now..  Outpatient Encounter Prescriptions as of 07/30/2016  Medication Sig  . allopurinol (ZYLOPRIM) 300 MG tablet Take 300 mg by mouth 2 (two) times daily.  Marland Kitchen. atorvastatin (LIPITOR) 10 MG tablet Take 10 mg by mouth daily.  . BD PEN NEEDLE NANO U/F 32G X 4 MM MISC   . calcium acetate (PHOSLO) 667 MG capsule Take 2,001 mg by mouth 3 (three) times daily with meals.   . carvedilol (COREG) 3.125 MG tablet Take 1 tablet (3.125 mg total) by mouth 2 (two) times daily with a meal.  . cinacalcet (SENSIPAR) 60 MG tablet Take 60 mg by mouth daily.  . clonazePAM (KLONOPIN) 1 MG tablet Take 0.5 tablets (0.5 mg total) by mouth 2 (two) times daily as needed for anxiety.  . colchicine 0.6 MG tablet Take 0.6 mg by mouth 3 (three) times daily.   Marland Kitchen. gabapentin (NEURONTIN) 300 MG capsule Take 1 capsule (300 mg total) by mouth at bedtime. (Patient taking differently: Take 300 mg by mouth 3 (three) times daily. )  . insulin glargine (LANTUS) 100 UNIT/ML injection Inject 0.35 mLs (35 Units total) into the skin at bedtime. (Patient taking differently: Inject 35 Units into the  skin at bedtime. Pt takes 70 units at bedtime)  . lanthanum (FOSRENOL) 1000 MG chewable tablet Chew 1,000 mg by mouth 3 (three) times daily with meals.   . multivitamin (RENA-VIT) TABS tablet Take 1 tablet by mouth daily.   Marland Kitchen. NOVOLOG FLEXPEN 100 UNIT/ML FlexPen Inject 35 Units as directed 3 (three) times daily with meals.  Marland Kitchen. omeprazole (PRILOSEC) 20 MG capsule Take 20 mg by mouth 2 (two) times daily before a meal.   . polyethylene glycol (MIRALAX / GLYCOLAX) packet Take 17 g by mouth daily as needed for mild constipation.  Marland Kitchen. warfarin (COUMADIN) 10 MG tablet Take 10 mg by mouth daily.   No facility-administered encounter medications on file as of 07/30/2016.     Allergies as of 07/30/2016 - Review Complete 07/30/2016  Allergen Reaction Noted  . Aspirin Other (See Comments) 08/05/2011    Past Medical History:  Diagnosis Date  . Abscess of right arm 06/17/2015  . Anemia   . Anxiety   . Arteriovenous fistula infection (HCC)   . Cardiomegaly   . Chronic anticoagulation   . Chronic pancreatitis (HCC)   . DVT (deep venous thrombosis) (HCC) 2011  . ESRD (end stage renal disease) (HCC)    [facility]  . GERD (gastroesophageal reflux disease)   . Gout   . Headache    migraines  . Hypertension   . Kidney stones   . LOC (loss of consciousness) (HCC)   .  Morbid obesity (HCC)   . OSA (obstructive sleep apnea)    not using cpap  . Pneumonia   . Secondary hyperparathyroidism (HCC)   . Seizures (HCC)    pt denies  . Septic shock(785.52)   . Shortness of breath dyspnea    with exertion  . Type 2 diabetes mellitus, uncontrolled (HCC)         Past Surgical History:  Procedure Laterality Date  . AMPUTATION FINGER / THUMB Right   . ANGIOPLASTY ILLIAC ARTERY Right 05/15/2016   Procedure: FIBRIN SHEATH ANGIOPLASTY;  Surgeon: Larina Earthly, MD;  Location: [facility] OR;  Service: Vascular;  Laterality: Right;  . APPLICATION OF WOUND VAC Left 06/20/2015   Procedure: APPLICATION OF WOUND  VAC;  Surgeon: Renford Dills, MD;  Location: [facility] ORS;  Service: Vascular;  Laterality: Left;  . AV FISTULA PLACEMENT    . AV FISTULA PLACEMENT Left 06/20/2015   Procedure: ARTERIOVENOUS (AV) FISTULA  ligation , excision;  Surgeon: Renford Dills, MD;  Location: [facility] ORS;  Service: Vascular;  Laterality: Left;  . EXCHANGE OF A DIALYSIS CATHETER Left 11/13/2015   Procedure: EXCHANGE OF A DIALYSIS CATHETER- LEFT INTERNAL JUGULAR;  Surgeon: Nada Libman, MD;  Location: [facility] OR;  Service: Vascular;  Laterality: Left;  . EXCHANGE OF A DIALYSIS CATHETER Right 05/15/2016   Procedure: EXCHANGE OF A RIGHT FEMORAL DIALYSIS CATHETER;  Surgeon: Larina Earthly, MD;  Location: [facility] OR;  Service: Vascular;  Laterality: Right;  . I&D EXTREMITY Left 06/20/2015   Procedure: IRRIGATION AND DEBRIDEMENT EXTREMITY;  Surgeon: Renford Dills, MD;  Location: [facility] ORS;  Service: Vascular;  Laterality: Left;  . INSERTION OF DIALYSIS CATHETER N/A 06/25/2015   Procedure: place a tunnelled catheter for long term use;  Surgeon: Annice Needy, MD;  Location: [facility] ORS;  Service: Vascular;  Laterality: N/A;  . INSERTION OF DIALYSIS CATHETER Left 07/25/2015   Procedure: INSERTION OF LEFT INTERNAL JUGULAR DIALYSIS CATHETER;  Surgeon: Pryor Ochoa, MD;  Location: [facility] OR;  Service: Vascular;  Laterality: Left;  . INSERTION OF DIALYSIS CATHETER Left 08/01/2015   Procedure: REMOVAL OF DIALYSIS CATHETER; PLACEMNET OF NEW DIALYSIS CATHETER;  Surgeon: Pryor Ochoa, MD;  Location: [facility] OR;  Service: Vascular;  Laterality: Left;  . INSERTION OF DIALYSIS CATHETER Right 03/31/2016   Procedure: INSERTION OF DIALYSIS CATHETER RIGHT FEMORAL ;  Surgeon: Chuck Hint, MD;  Location: [facility] OR;  Service: Vascular;  Laterality: Right;  . IR GENERIC HISTORICAL  05/28/2016   IR FLUORO GUIDE CV LINE RIGHT 05/28/2016 Berdine Dance, MD [facility]INTER[facility]  . IR GENERIC HISTORICAL  06/11/2016   IR FLUORO GUIDE CV LINE RIGHT 06/11/2016 [facility]  .  PERIPHERAL VASCULAR CATHETERIZATION N/A 06/25/2015   Procedure: Dialysis/Perma Catheter Insertion;  Surgeon: Annice Needy, MD;  Location: [facility] INVASIVE CV LAB;  Service: Cardiovascular;  Laterality: N/A;  . PERIPHERAL VASCULAR CATHETERIZATION Bilateral 09/13/2015   Procedure: Upper Extremity Venography;  Surgeon: Sherren Kerns, MD;  Location: [facility] INVASIVE CV LAB;  Service: Cardiovascular;  Laterality: Bilateral;    Family History  Problem Relation Age of Onset  . Diabetes Mother   . Hypertension Mother   . Diabetes Father   . Hypertension Father   . Hypertension Brother     Social History   Social History  . Marital status: Married    Spouse name: N/A  . Number of children: N/A  . Years of education: N/A   Occupational History  . disabled  Social History Main Topics  . Smoking status: Never Smoker  . Smokeless tobacco: Never Used  . Alcohol use No  . Drug use: No  . Sexual activity: Not on file   Other Topics Concern  . Not on file   Social History Narrative  . No narrative on file     Review of systems: Review of Systems  Constitutional: Negative for fever and chills. Positive for daytime somnolence, snoring. HENT: Negative.   Eyes: Negative for blurred vision.  Respiratory: as per HPI  Cardiovascular: Negative for chest pain and palpitations.  Gastrointestinal: Negative for vomiting, diarrhea, blood per rectum. Genitourinary: Negative for dysuria, urgency, frequency and hematuria.  Musculoskeletal: Negative for myalgias, back pain and joint pain.  Skin: Negative for itching and rash.  Neurological: Negative for dizziness, tremors, focal weakness, seizures and loss of consciousness.  Endo/Heme/Allergies: Negative for environmental allergies.  Psychiatric/Behavioral: Negative for depression, suicidal ideas and hallucinations.  All other systems reviewed and are negative.   Physical Exam: Blood pressure 136/72, pulse 95, height 5\' 11"  (1.803 m), weight (!)  468 lb (212.3 kg), SpO2 93 %. Gen:      No acute distress HEENT:  EOMI, sclera anicteric Neck:     No masses; no thyromegaly Lungs:    Clear to auscultation bilaterally; normal respiratory effort CV:         Regular rate and rhythm; no murmurs Abd:      + bowel sounds; soft, non-tender; no palpable masses, no distension Ext:    No edema; adequate peripheral perfusion Skin:      Warm and dry; no rash Neuro: alert and oriented x 3 Psych: normal mood and affect  Data Reviewed: Chest x-ray 05/15/16-interstitial opacities, pulmonary edema, normal lung volumes. Images reviewed.  CT scan 01/07/10- acute PE, airspace opacity in right upper lobe.   Sleep study 08/23/11-  1. Severe obstructive sleep apnea/hypopnea syndrome, apnea/hypopnea     index 111.1 per hour with non-positional events, moderately loud     snoring and oxygen desaturation to a nadir of 54% on room air. 2. Successful continuous positive airway pressure titration to 22 cm     of water pressure, apnea/hypopnea index 0 per hour.  He wore a     medium ResMed Quattro FX full-face mask with heated humidifier.     Oxygenation was improved, but will need to review in the home environment to insure     adequate oxygenation with CPAP alone.  Snoring was prevented by     CPAP.  Assessment:  Recent hypercarbic, hypoxic respiratory failure Severe untreated OSA. Suspected OHS. Morbid obesity  Shane Hamilton was hospitalized in July 17 for acute hypoxic, hypercarbic respiratory failure, volume overload. He was diagnosed with severe OSA in 2012. But has been noncompliant with his CPAP. He was supposed to get a split-night sleep study on discharge but this cannot be completed. He appears somnolent in the clinic today and I'm afraid he needs to get started on therapy ASAP to prevent another readmission.  I will start him immediately on his previous setting of CPAP at 22. I'm not sure this will be approved by insurance since he had been  noncompliant for a while. He will require repeat split-night sleep study and assessment for NIPPV settings. He may need to get transitioned to BiPAP based on these results.  Plan/Recommendations: - Start CPAP at 22. - Split night sleep study  [name] MD  Pulmonary and Critical Care Pager [phone] 07/30/2016, 9:37 AM  CC: Farris Has, MD

## 2016-07-31 DIAGNOSIS — E1122 Type 2 diabetes mellitus with diabetic chronic kidney disease: Secondary | ICD-10-CM | POA: Diagnosis not present

## 2016-07-31 DIAGNOSIS — D509 Iron deficiency anemia, unspecified: Secondary | ICD-10-CM | POA: Diagnosis not present

## 2016-07-31 DIAGNOSIS — L97221 Non-pressure chronic ulcer of left calf limited to breakdown of skin: Secondary | ICD-10-CM | POA: Diagnosis not present

## 2016-07-31 DIAGNOSIS — N186 End stage renal disease: Secondary | ICD-10-CM | POA: Diagnosis not present

## 2016-07-31 DIAGNOSIS — E1140 Type 2 diabetes mellitus with diabetic neuropathy, unspecified: Secondary | ICD-10-CM | POA: Diagnosis not present

## 2016-07-31 DIAGNOSIS — D631 Anemia in chronic kidney disease: Secondary | ICD-10-CM | POA: Diagnosis not present

## 2016-07-31 DIAGNOSIS — L89322 Pressure ulcer of left buttock, stage 2: Secondary | ICD-10-CM | POA: Diagnosis not present

## 2016-08-01 DIAGNOSIS — D509 Iron deficiency anemia, unspecified: Secondary | ICD-10-CM | POA: Diagnosis not present

## 2016-08-01 DIAGNOSIS — N186 End stage renal disease: Secondary | ICD-10-CM | POA: Diagnosis not present

## 2016-08-03 DIAGNOSIS — G4733 Obstructive sleep apnea (adult) (pediatric): Secondary | ICD-10-CM | POA: Diagnosis not present

## 2016-08-03 DIAGNOSIS — N63 Unspecified lump in unspecified breast: Secondary | ICD-10-CM | POA: Diagnosis not present

## 2016-08-03 DIAGNOSIS — E1165 Type 2 diabetes mellitus with hyperglycemia: Secondary | ICD-10-CM | POA: Diagnosis not present

## 2016-08-03 DIAGNOSIS — N186 End stage renal disease: Secondary | ICD-10-CM | POA: Diagnosis not present

## 2016-08-03 DIAGNOSIS — K0889 Other specified disorders of teeth and supporting structures: Secondary | ICD-10-CM | POA: Diagnosis not present

## 2016-08-04 ENCOUNTER — Other Ambulatory Visit: Payer: Self-pay | Admitting: Family Medicine

## 2016-08-04 DIAGNOSIS — Z992 Dependence on renal dialysis: Secondary | ICD-10-CM | POA: Diagnosis not present

## 2016-08-04 DIAGNOSIS — E1129 Type 2 diabetes mellitus with other diabetic kidney complication: Secondary | ICD-10-CM | POA: Diagnosis not present

## 2016-08-04 DIAGNOSIS — N6312 Unspecified lump in the right breast, upper inner quadrant: Secondary | ICD-10-CM

## 2016-08-04 DIAGNOSIS — N186 End stage renal disease: Secondary | ICD-10-CM | POA: Diagnosis not present

## 2016-08-04 DIAGNOSIS — D509 Iron deficiency anemia, unspecified: Secondary | ICD-10-CM | POA: Diagnosis not present

## 2016-08-05 ENCOUNTER — Encounter: Payer: Self-pay | Admitting: Vascular Surgery

## 2016-08-05 DIAGNOSIS — L89322 Pressure ulcer of left buttock, stage 2: Secondary | ICD-10-CM | POA: Diagnosis not present

## 2016-08-05 DIAGNOSIS — N186 End stage renal disease: Secondary | ICD-10-CM | POA: Diagnosis not present

## 2016-08-05 DIAGNOSIS — E1122 Type 2 diabetes mellitus with diabetic chronic kidney disease: Secondary | ICD-10-CM | POA: Diagnosis not present

## 2016-08-05 DIAGNOSIS — D631 Anemia in chronic kidney disease: Secondary | ICD-10-CM | POA: Diagnosis not present

## 2016-08-05 DIAGNOSIS — E1140 Type 2 diabetes mellitus with diabetic neuropathy, unspecified: Secondary | ICD-10-CM | POA: Diagnosis not present

## 2016-08-05 DIAGNOSIS — L97221 Non-pressure chronic ulcer of left calf limited to breakdown of skin: Secondary | ICD-10-CM | POA: Diagnosis not present

## 2016-08-06 DIAGNOSIS — N186 End stage renal disease: Secondary | ICD-10-CM | POA: Diagnosis not present

## 2016-08-06 DIAGNOSIS — D509 Iron deficiency anemia, unspecified: Secondary | ICD-10-CM | POA: Diagnosis not present

## 2016-08-06 DIAGNOSIS — D631 Anemia in chronic kidney disease: Secondary | ICD-10-CM | POA: Diagnosis not present

## 2016-08-06 DIAGNOSIS — I2782 Chronic pulmonary embolism: Secondary | ICD-10-CM | POA: Diagnosis not present

## 2016-08-06 DIAGNOSIS — E1129 Type 2 diabetes mellitus with other diabetic kidney complication: Secondary | ICD-10-CM | POA: Diagnosis not present

## 2016-08-07 ENCOUNTER — Ambulatory Visit
Admission: RE | Admit: 2016-08-07 | Discharge: 2016-08-07 | Disposition: A | Discharge status: Home / Self Care | Payer: Medicare Other | Source: Ambulatory Visit | Attending: Family Medicine | Admitting: Family Medicine

## 2016-08-07 ENCOUNTER — Ambulatory Visit: Payer: Medicare Other | Admitting: Vascular Surgery

## 2016-08-07 DIAGNOSIS — N6312 Unspecified lump in the right breast, upper inner quadrant: Secondary | ICD-10-CM

## 2016-08-07 DIAGNOSIS — E1122 Type 2 diabetes mellitus with diabetic chronic kidney disease: Secondary | ICD-10-CM | POA: Diagnosis not present

## 2016-08-07 DIAGNOSIS — E1140 Type 2 diabetes mellitus with diabetic neuropathy, unspecified: Secondary | ICD-10-CM | POA: Diagnosis not present

## 2016-08-07 DIAGNOSIS — D631 Anemia in chronic kidney disease: Secondary | ICD-10-CM | POA: Diagnosis not present

## 2016-08-07 DIAGNOSIS — N186 End stage renal disease: Secondary | ICD-10-CM | POA: Diagnosis not present

## 2016-08-07 DIAGNOSIS — L97221 Non-pressure chronic ulcer of left calf limited to breakdown of skin: Secondary | ICD-10-CM | POA: Diagnosis not present

## 2016-08-07 DIAGNOSIS — R928 Other abnormal and inconclusive findings on diagnostic imaging of breast: Secondary | ICD-10-CM | POA: Diagnosis not present

## 2016-08-07 DIAGNOSIS — N6310 Unspecified lump in the right breast, unspecified quadrant: Secondary | ICD-10-CM | POA: Diagnosis not present

## 2016-08-07 DIAGNOSIS — L89322 Pressure ulcer of left buttock, stage 2: Secondary | ICD-10-CM | POA: Diagnosis not present

## 2016-08-08 DIAGNOSIS — N186 End stage renal disease: Secondary | ICD-10-CM | POA: Diagnosis not present

## 2016-08-08 DIAGNOSIS — D509 Iron deficiency anemia, unspecified: Secondary | ICD-10-CM | POA: Diagnosis not present

## 2016-08-08 DIAGNOSIS — E1129 Type 2 diabetes mellitus with other diabetic kidney complication: Secondary | ICD-10-CM | POA: Diagnosis not present

## 2016-08-08 DIAGNOSIS — D631 Anemia in chronic kidney disease: Secondary | ICD-10-CM | POA: Diagnosis not present

## 2016-08-11 ENCOUNTER — Ambulatory Visit (HOSPITAL_BASED_OUTPATIENT_CLINIC_OR_DEPARTMENT_OTHER): Payer: Medicare Other | Attending: Pulmonary Disease | Admitting: Pulmonary Disease

## 2016-08-11 ENCOUNTER — Telehealth: Payer: Self-pay | Admitting: Pulmonary Disease

## 2016-08-11 DIAGNOSIS — N186 End stage renal disease: Secondary | ICD-10-CM | POA: Diagnosis not present

## 2016-08-11 DIAGNOSIS — G4733 Obstructive sleep apnea (adult) (pediatric): Secondary | ICD-10-CM | POA: Insufficient documentation

## 2016-08-11 DIAGNOSIS — E1129 Type 2 diabetes mellitus with other diabetic kidney complication: Secondary | ICD-10-CM | POA: Diagnosis not present

## 2016-08-11 DIAGNOSIS — D509 Iron deficiency anemia, unspecified: Secondary | ICD-10-CM | POA: Diagnosis not present

## 2016-08-11 DIAGNOSIS — D631 Anemia in chronic kidney disease: Secondary | ICD-10-CM | POA: Diagnosis not present

## 2016-08-11 NOTE — Telephone Encounter (Signed)
Spoke with Larita FifeLynn with [facility], who states we placed an order for cpap pressure of 22cm. Larita FifeLynn states cpap pressure will only go to max pressure of 20.  PM please advise. Thanks.

## 2016-08-13 DIAGNOSIS — I2782 Chronic pulmonary embolism: Secondary | ICD-10-CM | POA: Diagnosis not present

## 2016-08-13 DIAGNOSIS — D509 Iron deficiency anemia, unspecified: Secondary | ICD-10-CM | POA: Diagnosis not present

## 2016-08-13 DIAGNOSIS — E1129 Type 2 diabetes mellitus with other diabetic kidney complication: Secondary | ICD-10-CM | POA: Diagnosis not present

## 2016-08-13 DIAGNOSIS — D631 Anemia in chronic kidney disease: Secondary | ICD-10-CM | POA: Diagnosis not present

## 2016-08-13 DIAGNOSIS — N186 End stage renal disease: Secondary | ICD-10-CM | POA: Diagnosis not present

## 2016-08-13 NOTE — Telephone Encounter (Signed)
Ok to order CPAP 20cm. We will review after he gets his repeat sleep study done.

## 2016-08-14 ENCOUNTER — Ambulatory Visit: Payer: Medicare Other | Admitting: Vascular Surgery

## 2016-08-14 ENCOUNTER — Telehealth: Payer: Self-pay | Admitting: Pulmonary Disease

## 2016-08-14 NOTE — Telephone Encounter (Signed)
I just called and spoke with Selena BattenKim with [facility] and she stated that they have to have the Sleep Study before they will get the CPAP machine. The patient was Non compliant with the CPAP machine before and that is why it was picked up in 2013. The sleep study has not been read yet. I have attempted to call the patient back and explain this to him but he didn't answer the phone and no voice mail has been set up on his phone.

## 2016-08-14 NOTE — Telephone Encounter (Signed)
New order placed. Nothing further needed. 

## 2016-08-14 NOTE — Telephone Encounter (Signed)
I finally got to talk with Mr. Shane Hamilton and explained everything to him that I have found out so basically since the sleep study has not been read yet [facility] will not get the CPAP machine for him yet. He will cancel 11/13 appt and reschedule to come back in for follow up with Dr. Isaiah Hamilton. I wasn't sure as to exactly when he should reschedule his appt

## 2016-08-15 DIAGNOSIS — E1129 Type 2 diabetes mellitus with other diabetic kidney complication: Secondary | ICD-10-CM | POA: Diagnosis not present

## 2016-08-15 DIAGNOSIS — N186 End stage renal disease: Secondary | ICD-10-CM | POA: Diagnosis not present

## 2016-08-15 DIAGNOSIS — D509 Iron deficiency anemia, unspecified: Secondary | ICD-10-CM | POA: Diagnosis not present

## 2016-08-15 DIAGNOSIS — D631 Anemia in chronic kidney disease: Secondary | ICD-10-CM | POA: Diagnosis not present

## 2016-08-17 ENCOUNTER — Ambulatory Visit: Payer: Medicare Other | Admitting: Pulmonary Disease

## 2016-08-18 DIAGNOSIS — D631 Anemia in chronic kidney disease: Secondary | ICD-10-CM | POA: Diagnosis not present

## 2016-08-18 DIAGNOSIS — E1129 Type 2 diabetes mellitus with other diabetic kidney complication: Secondary | ICD-10-CM | POA: Diagnosis not present

## 2016-08-18 DIAGNOSIS — D509 Iron deficiency anemia, unspecified: Secondary | ICD-10-CM | POA: Diagnosis not present

## 2016-08-18 DIAGNOSIS — N186 End stage renal disease: Secondary | ICD-10-CM | POA: Diagnosis not present

## 2016-08-19 ENCOUNTER — Telehealth: Payer: Self-pay | Admitting: Pulmonary Disease

## 2016-08-19 DIAGNOSIS — G4733 Obstructive sleep apnea (adult) (pediatric): Secondary | ICD-10-CM

## 2016-08-19 NOTE — Telephone Encounter (Signed)
I don't not have his sleep study in my epic in box. Please call sleep lab, and have them enter his sleep study into my epic in box.

## 2016-08-19 NOTE — Telephone Encounter (Signed)
Sleep Study being placed in EPIC box for VS.

## 2016-08-19 NOTE — Telephone Encounter (Signed)
Called and spoke to pt. Pt questioning when he will get his CPAP, order was placed on 08/14/16. Spoke with Synetta FailAnita and she stated [facility] is waiting on the sleep study to be read. Called sleep center at [phone] and spoke to Terri and was informed the sleep study has indeed not been read yet.   Dr. Craige CottaSood please advise. Thanks.

## 2016-08-20 DIAGNOSIS — D631 Anemia in chronic kidney disease: Secondary | ICD-10-CM | POA: Diagnosis not present

## 2016-08-20 DIAGNOSIS — N186 End stage renal disease: Secondary | ICD-10-CM | POA: Diagnosis not present

## 2016-08-20 DIAGNOSIS — G4733 Obstructive sleep apnea (adult) (pediatric): Secondary | ICD-10-CM | POA: Diagnosis not present

## 2016-08-20 DIAGNOSIS — D509 Iron deficiency anemia, unspecified: Secondary | ICD-10-CM | POA: Diagnosis not present

## 2016-08-20 DIAGNOSIS — E1129 Type 2 diabetes mellitus with other diabetic kidney complication: Secondary | ICD-10-CM | POA: Diagnosis not present

## 2016-08-20 DIAGNOSIS — I2782 Chronic pulmonary embolism: Secondary | ICD-10-CM | POA: Diagnosis not present

## 2016-08-20 NOTE — Progress Notes (Signed)
Patient Name: Shane Hamilton, Nasser Date: 08/11/2016 Gender: Male D.O.B: Nov 17, 1965 Age (years): 67 Referring Provider: Marshell Garfinkel Height (inches): 71 Interpreting Physician: Chesley Mires MD, ABSM Weight (lbs): 403 RPSGT: Carolin Coy BMI: 45 MRN: [id number] Neck Size: 20.00  CLINICAL INFORMATION The patient is referred for a split night study with BPAP. Most recent polysomnogram dated 08/23/2011 revealed an AHI of 111.1/h. Most recent titration study dated 08/13/2011 was optimal at 22cm H2O with an AHI of 0.0/h.  MEDICATIONS Medications self-administered by patient taken the night of the study : N/A  SLEEP STUDY TECHNIQUE As per the AASM Manual for the Scoring of Sleep and Associated Events v2.3 (April 2016) with a hypopnea requiring 4% desaturations. The channels recorded and monitored were frontal, central and occipital EEG, electrooculogram (EOG), submentalis EMG (chin), nasal and oral airflow, thoracic and abdominal wall motion, anterior tibialis EMG, snore microphone, electrocardiogram, and pulse oximetry. Bi-level positive airway pressure (BiPAP) was initiated when the patient met split night criteria and was titrated according to treat sleep-disordered breathing.  RESPIRATORY PARAMETERS Diagnostic Total AHI (/hr): 91.8 RDI (/hr): 93.2 OA Index (/hr): 68.2 CA Index (/hr): 10.8 REM AHI (/hr): N/A NREM AHI (/hr): 91.8 Supine AHI (/hr): 72.1 Non-supine AHI (/hr): 94.81 Min O2 Sat (%): 0.00 Mean O2 (%): 81.47 Time below 88% (min): 105.5   Titration Optimal IPAP Pressure (cm): 26 Optimal EPAP Pressure (cm): 22 AHI at Optimal Pressure (/hr): 8.9 Min O2 at Optimal Pressure (%): 88.0 Sleep % at Optimal (%): 100 Supine % at Optimal (%): 0     SLEEP ARCHITECTURE The study was initiated at 10:35:13 PM and terminated at 5:05:37 AM. The total recorded time was 390.4 minutes. EEG confirmed total sleep time was 320.5 minutes yielding a sleep efficiency of 82.1%. Sleep onset after lights  out was 0.3 minutes with a REM latency of 202.5 minutes. The patient spent 24.49% of the night in stage N1 sleep, 63.49% in stage N2 sleep, 0.00% in stage N3 and 12.01% in REM. Wake after sleep onset (WASO) was 69.6 minutes. The Arousal Index was 36.5/hour.  LEG MOVEMENT DATA The total Periodic Limb Movements of Sleep (PLMS) were 4. The PLMS index was 0.75 .  CARDIAC DATA The 2 lead EKG demonstrated sinus rhythm. The mean heart rate was 96.13 beats per minute. Other EKG findings include: PVCs.  IMPRESSIONS - This study showed severe obstructive sleep apnea with an AHI of 91.8 and SpO2 low of  81%. - He had persistent respiratory events in spite of high CPAP settings.  He improved after being transitioned to Bipap 26/22 cm H2O.  At this pressure setting he was observed in REM, but not supine sleep. - He did not require supplemental oxygen once he was at adequate Bipap setting.  DIAGNOSIS - Obstructive Sleep Apnea (327.23 [G47.33 ICD-10])  RECOMMENDATIONS - Trial of BiPAP therapy on 26/22 cm H2O. - He was fitted with a Small size Fisher&Paykel Full Face Mask Simplus mask and heated humidification.  [Electronically signed] 08/20/2016 04:13 PM  Chesley Mires MD, [place], American Board of Sleep Medicine   NPI: [id number]

## 2016-08-20 NOTE — Procedures (Signed)
   Patient Name: Marbin, Olshefski Date: 08/11/2016 Gender: Male D.O.B: 09-29-1966 Age (years): 81 Referring Provider: Marshell Garfinkel Height (inches): 71 Interpreting Physician: Chesley Mires MD, ABSM Weight (lbs): 403 RPSGT: Carolin Coy BMI: 3 MRN: [id number] Neck Size: 20.00  CLINICAL INFORMATION The patient is referred for a split night study with BPAP. Most recent polysomnogram dated 08/23/2011 revealed an AHI of 111.1/h. Most recent titration study dated 08/13/2011 was optimal at 22cm H2O with an AHI of 0.0/h.  MEDICATIONS Medications self-administered by patient taken the night of the study : N/A  SLEEP STUDY TECHNIQUE As per the AASM Manual for the Scoring of Sleep and Associated Events v2.3 (April 2016) with a hypopnea requiring 4% desaturations. The channels recorded and monitored were frontal, central and occipital EEG, electrooculogram (EOG), submentalis EMG (chin), nasal and oral airflow, thoracic and abdominal wall motion, anterior tibialis EMG, snore microphone, electrocardiogram, and pulse oximetry. Bi-level positive airway pressure (BiPAP) was initiated when the patient met split night criteria and was titrated according to treat sleep-disordered breathing.  RESPIRATORY PARAMETERS Diagnostic Total AHI (/hr): 91.8 RDI (/hr): 93.2 OA Index (/hr): 68.2 CA Index (/hr): 10.8 REM AHI (/hr): N/A NREM AHI (/hr): 91.8 Supine AHI (/hr): 72.1 Non-supine AHI (/hr): 94.81 Min O2 Sat (%): 0.00 Mean O2 (%): 81.47 Time below 88% (min): 105.5   Titration Optimal IPAP Pressure (cm): 26 Optimal EPAP Pressure (cm): 22 AHI at Optimal Pressure (/hr): 8.9 Min O2 at Optimal Pressure (%): 88.0 Sleep % at Optimal (%): 100 Supine % at Optimal (%): 0     SLEEP ARCHITECTURE The study was initiated at 10:35:13 PM and terminated at 5:05:37 AM. The total recorded time was 390.4 minutes. EEG confirmed total sleep time was 320.5 minutes yielding a sleep efficiency of 82.1%. Sleep onset  after lights out was 0.3 minutes with a REM latency of 202.5 minutes. The patient spent 24.49% of the night in stage N1 sleep, 63.49% in stage N2 sleep, 0.00% in stage N3 and 12.01% in REM. Wake after sleep onset (WASO) was 69.6 minutes. The Arousal Index was 36.5/hour.  LEG MOVEMENT DATA The total Periodic Limb Movements of Sleep (PLMS) were 4. The PLMS index was 0.75 .  CARDIAC DATA The 2 lead EKG demonstrated sinus rhythm. The mean heart rate was 96.13 beats per minute. Other EKG findings include: PVCs.  IMPRESSIONS - This study showed severe obstructive sleep apnea with an AHI of 91.8 and SpO2 low of  81%. - He had persistent respiratory events in spite of high CPAP settings.  He improved after being transitioned to Bipap 26/22 cm H2O.  At this pressure setting he was observed in REM, but not supine sleep. - He did not require supplemental oxygen once he was at adequate Bipap setting.  DIAGNOSIS - Obstructive Sleep Apnea (327.23 [G47.33 ICD-10])  RECOMMENDATIONS - Trial of BiPAP therapy on 26/22 cm H2O. - He was fitted with a Small size Fisher&Paykel Full Face Mask Simplus mask and heated humidification.  [Electronically signed] 08/20/2016 04:13 PM  Chesley Mires MD, [place], American Board of Sleep Medicine   NPI: [id number]

## 2016-08-20 NOTE — Telephone Encounter (Signed)
Sleep study 08/11/16 >> AHI 91.8, SpO2 low 81%.  Bipap 26/22 cm H2O >> AHI 8.9.  Had persistent apneas with CPAP.  Didn't need supplemental oxygen on Bipap.   Please inform pt this his sleep study showed severe obstructive sleep apnea, and this was controlled once he was on Bipap 26/22 cm H2O.  Will route message to Dr. Isaiah SergeMannam to arrange for orders to start Bipap and arrange for follow up visit.

## 2016-08-20 NOTE — Telephone Encounter (Signed)
Called spoke with patient, informed him VS will review results as soon as the study is available Pt voiced his understanding Routing back to VS

## 2016-08-20 NOTE — Telephone Encounter (Signed)
Pt aware of results. Please advise Dr Isaiah SergeMannam. Thanks.

## 2016-08-20 NOTE — Telephone Encounter (Signed)
Still has not been added to my Epic inbox.  Can you please let pt know we will call him as soon as study is available for review.

## 2016-08-21 DIAGNOSIS — N186 End stage renal disease: Secondary | ICD-10-CM | POA: Diagnosis not present

## 2016-08-21 DIAGNOSIS — E1140 Type 2 diabetes mellitus with diabetic neuropathy, unspecified: Secondary | ICD-10-CM | POA: Diagnosis not present

## 2016-08-21 DIAGNOSIS — L89322 Pressure ulcer of left buttock, stage 2: Secondary | ICD-10-CM | POA: Diagnosis not present

## 2016-08-21 DIAGNOSIS — L97221 Non-pressure chronic ulcer of left calf limited to breakdown of skin: Secondary | ICD-10-CM | POA: Diagnosis not present

## 2016-08-21 DIAGNOSIS — D631 Anemia in chronic kidney disease: Secondary | ICD-10-CM | POA: Diagnosis not present

## 2016-08-21 DIAGNOSIS — E1122 Type 2 diabetes mellitus with diabetic chronic kidney disease: Secondary | ICD-10-CM | POA: Diagnosis not present

## 2016-08-21 NOTE — Telephone Encounter (Signed)
Order Bipap 26/22 cm H2O as per the titration study. He will need follow up in 1 month with Bipap download to review.

## 2016-08-21 NOTE — Telephone Encounter (Signed)
Spoke with pt. He is aware that we will be placing this order for him. Order has been placed. He will call back to make his appointment once he is set up with BiPAP. Nothing further was needed at this time.

## 2016-08-22 DIAGNOSIS — N186 End stage renal disease: Secondary | ICD-10-CM | POA: Diagnosis not present

## 2016-08-22 DIAGNOSIS — D509 Iron deficiency anemia, unspecified: Secondary | ICD-10-CM | POA: Diagnosis not present

## 2016-08-22 DIAGNOSIS — E1129 Type 2 diabetes mellitus with other diabetic kidney complication: Secondary | ICD-10-CM | POA: Diagnosis not present

## 2016-08-22 DIAGNOSIS — D631 Anemia in chronic kidney disease: Secondary | ICD-10-CM | POA: Diagnosis not present

## 2016-08-24 ENCOUNTER — Telehealth: Payer: Self-pay | Admitting: Pulmonary Disease

## 2016-08-24 DIAGNOSIS — E1129 Type 2 diabetes mellitus with other diabetic kidney complication: Secondary | ICD-10-CM | POA: Diagnosis not present

## 2016-08-24 DIAGNOSIS — G4733 Obstructive sleep apnea (adult) (pediatric): Secondary | ICD-10-CM

## 2016-08-24 DIAGNOSIS — N186 End stage renal disease: Secondary | ICD-10-CM | POA: Diagnosis not present

## 2016-08-24 DIAGNOSIS — D509 Iron deficiency anemia, unspecified: Secondary | ICD-10-CM | POA: Diagnosis not present

## 2016-08-24 DIAGNOSIS — D631 Anemia in chronic kidney disease: Secondary | ICD-10-CM | POA: Diagnosis not present

## 2016-08-24 NOTE — Telephone Encounter (Signed)
Pt is calling to get the results of his sleep study.  VS please advise. thanks

## 2016-08-24 NOTE — Telephone Encounter (Signed)
PM   [facility] called just needing you to sign off on the order for this pt. BiPAP so it can be processed.

## 2016-08-25 ENCOUNTER — Emergency Department (HOSPITAL_COMMUNITY): Payer: No Typology Code available for payment source

## 2016-08-25 ENCOUNTER — Emergency Department (HOSPITAL_COMMUNITY)
Admission: EM | Admit: 2016-08-25 | Discharge: 2016-08-25 | Disposition: A | Discharge status: Home / Self Care | Payer: No Typology Code available for payment source | Attending: Emergency Medicine | Admitting: Emergency Medicine

## 2016-08-25 ENCOUNTER — Encounter (HOSPITAL_COMMUNITY): Payer: Self-pay | Admitting: Emergency Medicine

## 2016-08-25 DIAGNOSIS — D509 Iron deficiency anemia, unspecified: Secondary | ICD-10-CM | POA: Diagnosis not present

## 2016-08-25 DIAGNOSIS — I120 Hypertensive chronic kidney disease with stage 5 chronic kidney disease or end stage renal disease: Secondary | ICD-10-CM | POA: Diagnosis not present

## 2016-08-25 DIAGNOSIS — E1122 Type 2 diabetes mellitus with diabetic chronic kidney disease: Secondary | ICD-10-CM | POA: Diagnosis not present

## 2016-08-25 DIAGNOSIS — Z992 Dependence on renal dialysis: Secondary | ICD-10-CM | POA: Diagnosis not present

## 2016-08-25 DIAGNOSIS — Z794 Long term (current) use of insulin: Secondary | ICD-10-CM | POA: Diagnosis not present

## 2016-08-25 DIAGNOSIS — Y999 Unspecified external cause status: Secondary | ICD-10-CM | POA: Insufficient documentation

## 2016-08-25 DIAGNOSIS — E1129 Type 2 diabetes mellitus with other diabetic kidney complication: Secondary | ICD-10-CM | POA: Diagnosis not present

## 2016-08-25 DIAGNOSIS — S79911A Unspecified injury of right hip, initial encounter: Secondary | ICD-10-CM | POA: Insufficient documentation

## 2016-08-25 DIAGNOSIS — Y939 Activity, unspecified: Secondary | ICD-10-CM | POA: Insufficient documentation

## 2016-08-25 DIAGNOSIS — Y92410 Unspecified street and highway as the place of occurrence of the external cause: Secondary | ICD-10-CM | POA: Insufficient documentation

## 2016-08-25 DIAGNOSIS — N186 End stage renal disease: Secondary | ICD-10-CM | POA: Diagnosis not present

## 2016-08-25 DIAGNOSIS — M542 Cervicalgia: Secondary | ICD-10-CM | POA: Insufficient documentation

## 2016-08-25 DIAGNOSIS — M791 Myalgia: Secondary | ICD-10-CM | POA: Diagnosis not present

## 2016-08-25 DIAGNOSIS — D631 Anemia in chronic kidney disease: Secondary | ICD-10-CM | POA: Diagnosis not present

## 2016-08-25 DIAGNOSIS — Z7901 Long term (current) use of anticoagulants: Secondary | ICD-10-CM | POA: Diagnosis not present

## 2016-08-25 DIAGNOSIS — M25551 Pain in right hip: Secondary | ICD-10-CM | POA: Diagnosis not present

## 2016-08-25 DIAGNOSIS — T148XXA Other injury of unspecified body region, initial encounter: Secondary | ICD-10-CM | POA: Diagnosis not present

## 2016-08-25 MED ORDER — IBUPROFEN 800 MG PO TABS
800.0000 mg | ORAL_TABLET | Freq: Once | ORAL | Status: AC
  Administered 2016-08-25: 800 mg via ORAL
  Filled 2016-08-25: qty 1

## 2016-08-25 MED ORDER — OXYCODONE HCL 5 MG PO TABS
5.0000 mg | ORAL_TABLET | Freq: Once | ORAL | Status: AC
  Administered 2016-08-25: 5 mg via ORAL
  Filled 2016-08-25: qty 1

## 2016-08-25 MED ORDER — ACETAMINOPHEN 500 MG PO TABS
1000.0000 mg | ORAL_TABLET | Freq: Once | ORAL | Status: AC
  Administered 2016-08-25: 1000 mg via ORAL
  Filled 2016-08-25: qty 2

## 2016-08-25 NOTE — ED Notes (Signed)
PT remains in radiology 

## 2016-08-25 NOTE — Telephone Encounter (Signed)
PM  Please Advise  [facility] stating that they need the order changed from the 26/22 to 25/22 because that is the highest for this pt. Machine that it will go.    Chilton GreathousePraveen Mannam, MD      8:39 AM  Note    Order Bipap 26/22 cm H2O as per the titration study. He will need follow up in 1 month with Bipap download to review.        Coralyn HellingVineet Sood, MD      4:18 PM  Note    Sleep study 08/11/16 >> AHI 91.8, SpO2 low 81%.  Bipap 26/22 cm H2O >> AHI 8.9.  Had persistent apneas with CPAP.  Didn't need supplemental oxygen on Bipap.   Please inform pt this his sleep study showed severe obstructive sleep apnea, and this was controlled once he was on Bipap 26/22 cm H2O.  Will route message to Dr. Isaiah SergeMannam to arrange for orders to start Bipap and arrange for follow up visit.

## 2016-08-25 NOTE — ED Notes (Signed)
ED Provider at bedside. 
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
## 2016-08-25 NOTE — Discharge Instructions (Signed)
Take 4 over the counter ibuprofen tablets 3 times a day or 2 over-the-counter naproxen tablets twice a day for pain. Also take tylenol 1000mg (2 extra strength) four times a day.   Do this for the next week. Then follow-up with her family physician. If you have continued pain he likely need to have repeat imaging. You will hurt worse tomorrow.

## 2016-08-25 NOTE — Telephone Encounter (Signed)
This was addressed with pt on 08/19/16 by Dr. Isaiah SergeMannam.  Please refer to phone note from this date.
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
## 2016-08-25 NOTE — Telephone Encounter (Signed)
Calling stating that they are needing a corrected order on pt bipap please advise she can be reached @ [phone]

## 2016-08-26 DIAGNOSIS — D631 Anemia in chronic kidney disease: Secondary | ICD-10-CM | POA: Diagnosis not present

## 2016-08-26 DIAGNOSIS — N186 End stage renal disease: Secondary | ICD-10-CM | POA: Diagnosis not present

## 2016-08-26 DIAGNOSIS — D509 Iron deficiency anemia, unspecified: Secondary | ICD-10-CM | POA: Diagnosis not present

## 2016-08-26 DIAGNOSIS — I82599 Chronic embolism and thrombosis of other specified deep vein of unspecified lower extremity: Secondary | ICD-10-CM | POA: Diagnosis not present

## 2016-08-26 DIAGNOSIS — Z5181 Encounter for therapeutic drug level monitoring: Secondary | ICD-10-CM | POA: Diagnosis not present

## 2016-08-26 DIAGNOSIS — E1129 Type 2 diabetes mellitus with other diabetic kidney complication: Secondary | ICD-10-CM | POA: Diagnosis not present

## 2016-08-26 DIAGNOSIS — Z7901 Long term (current) use of anticoagulants: Secondary | ICD-10-CM | POA: Diagnosis not present

## 2016-08-28 DIAGNOSIS — L97221 Non-pressure chronic ulcer of left calf limited to breakdown of skin: Secondary | ICD-10-CM | POA: Diagnosis not present

## 2016-08-28 DIAGNOSIS — E1140 Type 2 diabetes mellitus with diabetic neuropathy, unspecified: Secondary | ICD-10-CM | POA: Diagnosis not present

## 2016-08-28 DIAGNOSIS — L89322 Pressure ulcer of left buttock, stage 2: Secondary | ICD-10-CM | POA: Diagnosis not present

## 2016-08-28 DIAGNOSIS — N186 End stage renal disease: Secondary | ICD-10-CM | POA: Diagnosis not present

## 2016-08-28 DIAGNOSIS — E1122 Type 2 diabetes mellitus with diabetic chronic kidney disease: Secondary | ICD-10-CM | POA: Diagnosis not present

## 2016-08-28 DIAGNOSIS — D631 Anemia in chronic kidney disease: Secondary | ICD-10-CM | POA: Diagnosis not present

## 2016-08-28 NOTE — Telephone Encounter (Signed)
Ok to order Bipap 25/22

## 2016-08-28 NOTE — Telephone Encounter (Signed)
New order placed. Nothing further needed. 

## 2016-08-29 DIAGNOSIS — D631 Anemia in chronic kidney disease: Secondary | ICD-10-CM | POA: Diagnosis not present

## 2016-08-29 DIAGNOSIS — E1129 Type 2 diabetes mellitus with other diabetic kidney complication: Secondary | ICD-10-CM | POA: Diagnosis not present

## 2016-08-29 DIAGNOSIS — N186 End stage renal disease: Secondary | ICD-10-CM | POA: Diagnosis not present

## 2016-08-29 DIAGNOSIS — D509 Iron deficiency anemia, unspecified: Secondary | ICD-10-CM | POA: Diagnosis not present

## 2016-08-30 DIAGNOSIS — Z86718 Personal history of other venous thrombosis and embolism: Secondary | ICD-10-CM | POA: Diagnosis not present

## 2016-08-30 DIAGNOSIS — D631 Anemia in chronic kidney disease: Secondary | ICD-10-CM | POA: Diagnosis not present

## 2016-08-30 DIAGNOSIS — G4733 Obstructive sleep apnea (adult) (pediatric): Secondary | ICD-10-CM | POA: Diagnosis not present

## 2016-08-30 DIAGNOSIS — Z794 Long term (current) use of insulin: Secondary | ICD-10-CM | POA: Diagnosis not present

## 2016-08-30 DIAGNOSIS — L89322 Pressure ulcer of left buttock, stage 2: Secondary | ICD-10-CM | POA: Diagnosis not present

## 2016-08-30 DIAGNOSIS — Z7901 Long term (current) use of anticoagulants: Secondary | ICD-10-CM | POA: Diagnosis not present

## 2016-08-30 DIAGNOSIS — Z5181 Encounter for therapeutic drug level monitoring: Secondary | ICD-10-CM | POA: Diagnosis not present

## 2016-08-30 DIAGNOSIS — E1140 Type 2 diabetes mellitus with diabetic neuropathy, unspecified: Secondary | ICD-10-CM | POA: Diagnosis not present

## 2016-08-30 DIAGNOSIS — E1122 Type 2 diabetes mellitus with diabetic chronic kidney disease: Secondary | ICD-10-CM | POA: Diagnosis not present

## 2016-08-30 DIAGNOSIS — Z89021 Acquired absence of right finger(s): Secondary | ICD-10-CM | POA: Diagnosis not present

## 2016-08-30 DIAGNOSIS — Z992 Dependence on renal dialysis: Secondary | ICD-10-CM | POA: Diagnosis not present

## 2016-08-30 DIAGNOSIS — Z89011 Acquired absence of right thumb: Secondary | ICD-10-CM | POA: Diagnosis not present

## 2016-08-30 DIAGNOSIS — Z86711 Personal history of pulmonary embolism: Secondary | ICD-10-CM | POA: Diagnosis not present

## 2016-08-30 DIAGNOSIS — N186 End stage renal disease: Secondary | ICD-10-CM | POA: Diagnosis not present

## 2016-09-01 DIAGNOSIS — D631 Anemia in chronic kidney disease: Secondary | ICD-10-CM | POA: Diagnosis not present

## 2016-09-01 DIAGNOSIS — N186 End stage renal disease: Secondary | ICD-10-CM | POA: Diagnosis not present

## 2016-09-01 DIAGNOSIS — D509 Iron deficiency anemia, unspecified: Secondary | ICD-10-CM | POA: Diagnosis not present

## 2016-09-01 DIAGNOSIS — E1129 Type 2 diabetes mellitus with other diabetic kidney complication: Secondary | ICD-10-CM | POA: Diagnosis not present

## 2016-09-02 DIAGNOSIS — H35033 Hypertensive retinopathy, bilateral: Secondary | ICD-10-CM | POA: Diagnosis not present

## 2016-09-02 DIAGNOSIS — H2513 Age-related nuclear cataract, bilateral: Secondary | ICD-10-CM | POA: Diagnosis not present

## 2016-09-02 DIAGNOSIS — H25013 Cortical age-related cataract, bilateral: Secondary | ICD-10-CM | POA: Diagnosis not present

## 2016-09-02 DIAGNOSIS — E113393 Type 2 diabetes mellitus with moderate nonproliferative diabetic retinopathy without macular edema, bilateral: Secondary | ICD-10-CM | POA: Diagnosis not present

## 2016-09-03 DIAGNOSIS — D631 Anemia in chronic kidney disease: Secondary | ICD-10-CM | POA: Diagnosis not present

## 2016-09-03 DIAGNOSIS — D509 Iron deficiency anemia, unspecified: Secondary | ICD-10-CM | POA: Diagnosis not present

## 2016-09-03 DIAGNOSIS — N186 End stage renal disease: Secondary | ICD-10-CM | POA: Diagnosis not present

## 2016-09-03 DIAGNOSIS — E1129 Type 2 diabetes mellitus with other diabetic kidney complication: Secondary | ICD-10-CM | POA: Diagnosis not present

## 2016-09-03 DIAGNOSIS — Z992 Dependence on renal dialysis: Secondary | ICD-10-CM | POA: Diagnosis not present

## 2016-09-04 DIAGNOSIS — E1140 Type 2 diabetes mellitus with diabetic neuropathy, unspecified: Secondary | ICD-10-CM | POA: Diagnosis not present

## 2016-09-04 DIAGNOSIS — N186 End stage renal disease: Secondary | ICD-10-CM | POA: Diagnosis not present

## 2016-09-04 DIAGNOSIS — E1122 Type 2 diabetes mellitus with diabetic chronic kidney disease: Secondary | ICD-10-CM | POA: Diagnosis not present

## 2016-09-04 DIAGNOSIS — D631 Anemia in chronic kidney disease: Secondary | ICD-10-CM | POA: Diagnosis not present

## 2016-09-04 DIAGNOSIS — L89322 Pressure ulcer of left buttock, stage 2: Secondary | ICD-10-CM | POA: Diagnosis not present

## 2016-09-04 DIAGNOSIS — G4733 Obstructive sleep apnea (adult) (pediatric): Secondary | ICD-10-CM | POA: Diagnosis not present

## 2016-09-05 DIAGNOSIS — N186 End stage renal disease: Secondary | ICD-10-CM | POA: Diagnosis not present

## 2016-09-05 DIAGNOSIS — D509 Iron deficiency anemia, unspecified: Secondary | ICD-10-CM | POA: Diagnosis not present

## 2016-09-05 DIAGNOSIS — D631 Anemia in chronic kidney disease: Secondary | ICD-10-CM | POA: Diagnosis not present

## 2016-09-08 DIAGNOSIS — I82599 Chronic embolism and thrombosis of other specified deep vein of unspecified lower extremity: Secondary | ICD-10-CM | POA: Diagnosis not present

## 2016-09-08 DIAGNOSIS — D631 Anemia in chronic kidney disease: Secondary | ICD-10-CM | POA: Diagnosis not present

## 2016-09-08 DIAGNOSIS — N186 End stage renal disease: Secondary | ICD-10-CM | POA: Diagnosis not present

## 2016-09-08 DIAGNOSIS — D509 Iron deficiency anemia, unspecified: Secondary | ICD-10-CM | POA: Diagnosis not present

## 2016-09-09 ENCOUNTER — Encounter: Payer: Self-pay | Admitting: Neurology

## 2016-09-09 ENCOUNTER — Ambulatory Visit (INDEPENDENT_AMBULATORY_CARE_PROVIDER_SITE_OTHER): Payer: Medicare Other | Admitting: Neurology

## 2016-09-09 VITALS — BP 120/80 | HR 103 | Ht 71.0 in | Wt >= 6400 oz

## 2016-09-09 DIAGNOSIS — R202 Paresthesia of skin: Secondary | ICD-10-CM | POA: Diagnosis not present

## 2016-09-09 DIAGNOSIS — I70213 Atherosclerosis of native arteries of extremities with intermittent claudication, bilateral legs: Secondary | ICD-10-CM | POA: Diagnosis not present

## 2016-09-09 DIAGNOSIS — E7800 Pure hypercholesterolemia, unspecified: Secondary | ICD-10-CM | POA: Diagnosis not present

## 2016-09-09 DIAGNOSIS — Z79899 Other long term (current) drug therapy: Secondary | ICD-10-CM

## 2016-09-09 DIAGNOSIS — N186 End stage renal disease: Secondary | ICD-10-CM | POA: Diagnosis not present

## 2016-09-09 DIAGNOSIS — M109 Gout, unspecified: Secondary | ICD-10-CM | POA: Diagnosis not present

## 2016-09-09 DIAGNOSIS — I10 Essential (primary) hypertension: Secondary | ICD-10-CM | POA: Diagnosis not present

## 2016-09-09 DIAGNOSIS — E1121 Type 2 diabetes mellitus with diabetic nephropathy: Secondary | ICD-10-CM | POA: Diagnosis not present

## 2016-09-09 DIAGNOSIS — G4733 Obstructive sleep apnea (adult) (pediatric): Secondary | ICD-10-CM | POA: Diagnosis not present

## 2016-09-09 DIAGNOSIS — E0842 Diabetes mellitus due to underlying condition with diabetic polyneuropathy: Secondary | ICD-10-CM

## 2016-09-09 DIAGNOSIS — R52 Pain, unspecified: Secondary | ICD-10-CM

## 2016-09-09 MED ORDER — LIDOCAINE 5 % EX OINT: TOPICAL_OINTMENT | CUTANEOUS | 3 refills | Status: AC

## 2016-09-09 MED ORDER — NORTRIPTYLINE HCL 10 MG PO CAPS: 10.0000 mg | ORAL_CAPSULE | Freq: Every day | ORAL | 3 refills | Status: AC

## 2016-09-09 MED ORDER — GABAPENTIN 300 MG PO CAPS: ORAL_CAPSULE | ORAL | Status: DC

## 2016-09-09 NOTE — Patient Instructions (Addendum)
1.  Take gabapentin 300mg  daily.  You can take extra dose after dialysis 2.  Start nortriptyline 10mg  at bedtime 3.  You can also try lidocaine ointment to your feet twice daily.  Use with gloves or cotton swab  Call with an update in 1 month
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
## 2016-09-10 DIAGNOSIS — I2782 Chronic pulmonary embolism: Secondary | ICD-10-CM | POA: Diagnosis not present

## 2016-09-10 DIAGNOSIS — N186 End stage renal disease: Secondary | ICD-10-CM | POA: Diagnosis not present

## 2016-09-10 DIAGNOSIS — D509 Iron deficiency anemia, unspecified: Secondary | ICD-10-CM | POA: Diagnosis not present

## 2016-09-10 DIAGNOSIS — D631 Anemia in chronic kidney disease: Secondary | ICD-10-CM | POA: Diagnosis not present

## 2016-09-12 DIAGNOSIS — D631 Anemia in chronic kidney disease: Secondary | ICD-10-CM | POA: Diagnosis not present

## 2016-09-12 DIAGNOSIS — N186 End stage renal disease: Secondary | ICD-10-CM | POA: Diagnosis not present

## 2016-09-12 DIAGNOSIS — D509 Iron deficiency anemia, unspecified: Secondary | ICD-10-CM | POA: Diagnosis not present

## 2016-09-15 ENCOUNTER — Ambulatory Visit: Payer: Medicare Other | Admitting: Pulmonary Disease

## 2016-09-15 DIAGNOSIS — D509 Iron deficiency anemia, unspecified: Secondary | ICD-10-CM | POA: Diagnosis not present

## 2016-09-15 DIAGNOSIS — D631 Anemia in chronic kidney disease: Secondary | ICD-10-CM | POA: Diagnosis not present

## 2016-09-15 DIAGNOSIS — I2782 Chronic pulmonary embolism: Secondary | ICD-10-CM | POA: Diagnosis not present

## 2016-09-15 DIAGNOSIS — N186 End stage renal disease: Secondary | ICD-10-CM | POA: Diagnosis not present

## 2016-09-17 DIAGNOSIS — D509 Iron deficiency anemia, unspecified: Secondary | ICD-10-CM | POA: Diagnosis not present

## 2016-09-17 DIAGNOSIS — I2782 Chronic pulmonary embolism: Secondary | ICD-10-CM | POA: Diagnosis not present

## 2016-09-17 DIAGNOSIS — D631 Anemia in chronic kidney disease: Secondary | ICD-10-CM | POA: Diagnosis not present

## 2016-09-17 DIAGNOSIS — N186 End stage renal disease: Secondary | ICD-10-CM | POA: Diagnosis not present

## 2016-09-19 DIAGNOSIS — D509 Iron deficiency anemia, unspecified: Secondary | ICD-10-CM | POA: Diagnosis not present

## 2016-09-19 DIAGNOSIS — N186 End stage renal disease: Secondary | ICD-10-CM | POA: Diagnosis not present

## 2016-09-19 DIAGNOSIS — D631 Anemia in chronic kidney disease: Secondary | ICD-10-CM | POA: Diagnosis not present

## 2016-09-22 DIAGNOSIS — L89322 Pressure ulcer of left buttock, stage 2: Secondary | ICD-10-CM | POA: Diagnosis not present

## 2016-09-22 DIAGNOSIS — N186 End stage renal disease: Secondary | ICD-10-CM | POA: Diagnosis not present

## 2016-09-22 DIAGNOSIS — D631 Anemia in chronic kidney disease: Secondary | ICD-10-CM | POA: Diagnosis not present

## 2016-09-22 DIAGNOSIS — E1140 Type 2 diabetes mellitus with diabetic neuropathy, unspecified: Secondary | ICD-10-CM | POA: Diagnosis not present

## 2016-09-22 DIAGNOSIS — D509 Iron deficiency anemia, unspecified: Secondary | ICD-10-CM | POA: Diagnosis not present

## 2016-09-22 DIAGNOSIS — G4733 Obstructive sleep apnea (adult) (pediatric): Secondary | ICD-10-CM | POA: Diagnosis not present

## 2016-09-22 DIAGNOSIS — E1122 Type 2 diabetes mellitus with diabetic chronic kidney disease: Secondary | ICD-10-CM | POA: Diagnosis not present

## 2016-09-24 DIAGNOSIS — D509 Iron deficiency anemia, unspecified: Secondary | ICD-10-CM | POA: Diagnosis not present

## 2016-09-24 DIAGNOSIS — I2782 Chronic pulmonary embolism: Secondary | ICD-10-CM | POA: Diagnosis not present

## 2016-09-24 DIAGNOSIS — N186 End stage renal disease: Secondary | ICD-10-CM | POA: Diagnosis not present

## 2016-09-24 DIAGNOSIS — D631 Anemia in chronic kidney disease: Secondary | ICD-10-CM | POA: Diagnosis not present

## 2016-09-26 DIAGNOSIS — N186 End stage renal disease: Secondary | ICD-10-CM | POA: Diagnosis not present

## 2016-09-26 DIAGNOSIS — Z992 Dependence on renal dialysis: Secondary | ICD-10-CM | POA: Diagnosis not present

## 2016-09-29 DIAGNOSIS — D631 Anemia in chronic kidney disease: Secondary | ICD-10-CM | POA: Diagnosis not present

## 2016-09-29 DIAGNOSIS — D509 Iron deficiency anemia, unspecified: Secondary | ICD-10-CM | POA: Diagnosis not present

## 2016-09-29 DIAGNOSIS — N186 End stage renal disease: Secondary | ICD-10-CM | POA: Diagnosis not present

## 2016-10-01 DIAGNOSIS — D631 Anemia in chronic kidney disease: Secondary | ICD-10-CM | POA: Diagnosis not present

## 2016-10-01 DIAGNOSIS — Z7901 Long term (current) use of anticoagulants: Secondary | ICD-10-CM | POA: Diagnosis not present

## 2016-10-01 DIAGNOSIS — Z5181 Encounter for therapeutic drug level monitoring: Secondary | ICD-10-CM | POA: Diagnosis not present

## 2016-10-01 DIAGNOSIS — I82599 Chronic embolism and thrombosis of other specified deep vein of unspecified lower extremity: Secondary | ICD-10-CM | POA: Diagnosis not present

## 2016-10-01 DIAGNOSIS — N186 End stage renal disease: Secondary | ICD-10-CM | POA: Diagnosis not present

## 2016-10-01 DIAGNOSIS — D509 Iron deficiency anemia, unspecified: Secondary | ICD-10-CM | POA: Diagnosis not present

## 2016-10-03 DIAGNOSIS — D631 Anemia in chronic kidney disease: Secondary | ICD-10-CM | POA: Diagnosis not present

## 2016-10-03 DIAGNOSIS — N186 End stage renal disease: Secondary | ICD-10-CM | POA: Diagnosis not present

## 2016-10-03 DIAGNOSIS — D509 Iron deficiency anemia, unspecified: Secondary | ICD-10-CM | POA: Diagnosis not present

## 2016-10-04 DIAGNOSIS — Z992 Dependence on renal dialysis: Secondary | ICD-10-CM | POA: Diagnosis not present

## 2016-10-04 DIAGNOSIS — N186 End stage renal disease: Secondary | ICD-10-CM | POA: Diagnosis not present

## 2016-10-04 DIAGNOSIS — E1129 Type 2 diabetes mellitus with other diabetic kidney complication: Secondary | ICD-10-CM | POA: Diagnosis not present

## 2016-10-06 DIAGNOSIS — E1140 Type 2 diabetes mellitus with diabetic neuropathy, unspecified: Secondary | ICD-10-CM | POA: Diagnosis not present

## 2016-10-06 DIAGNOSIS — G4733 Obstructive sleep apnea (adult) (pediatric): Secondary | ICD-10-CM | POA: Diagnosis not present

## 2016-10-06 DIAGNOSIS — D509 Iron deficiency anemia, unspecified: Secondary | ICD-10-CM | POA: Diagnosis not present

## 2016-10-06 DIAGNOSIS — L89322 Pressure ulcer of left buttock, stage 2: Secondary | ICD-10-CM | POA: Diagnosis not present

## 2016-10-06 DIAGNOSIS — Z992 Dependence on renal dialysis: Secondary | ICD-10-CM | POA: Diagnosis not present

## 2016-10-06 DIAGNOSIS — D631 Anemia in chronic kidney disease: Secondary | ICD-10-CM | POA: Diagnosis not present

## 2016-10-06 DIAGNOSIS — N186 End stage renal disease: Secondary | ICD-10-CM | POA: Diagnosis not present

## 2016-10-06 DIAGNOSIS — E1129 Type 2 diabetes mellitus with other diabetic kidney complication: Secondary | ICD-10-CM | POA: Diagnosis not present

## 2016-10-06 DIAGNOSIS — N2581 Secondary hyperparathyroidism of renal origin: Secondary | ICD-10-CM | POA: Diagnosis not present

## 2016-10-06 DIAGNOSIS — E1122 Type 2 diabetes mellitus with diabetic chronic kidney disease: Secondary | ICD-10-CM | POA: Diagnosis not present

## 2016-10-08 DIAGNOSIS — N2581 Secondary hyperparathyroidism of renal origin: Secondary | ICD-10-CM | POA: Diagnosis not present

## 2016-10-08 DIAGNOSIS — D509 Iron deficiency anemia, unspecified: Secondary | ICD-10-CM | POA: Diagnosis not present

## 2016-10-08 DIAGNOSIS — N186 End stage renal disease: Secondary | ICD-10-CM | POA: Diagnosis not present

## 2016-10-08 DIAGNOSIS — E1129 Type 2 diabetes mellitus with other diabetic kidney complication: Secondary | ICD-10-CM | POA: Diagnosis not present

## 2016-10-08 DIAGNOSIS — D631 Anemia in chronic kidney disease: Secondary | ICD-10-CM | POA: Diagnosis not present

## 2016-10-08 DIAGNOSIS — Z992 Dependence on renal dialysis: Secondary | ICD-10-CM | POA: Diagnosis not present

## 2016-10-08 DIAGNOSIS — I2782 Chronic pulmonary embolism: Secondary | ICD-10-CM | POA: Diagnosis not present

## 2016-10-10 DIAGNOSIS — N2581 Secondary hyperparathyroidism of renal origin: Secondary | ICD-10-CM | POA: Diagnosis not present

## 2016-10-10 DIAGNOSIS — I82599 Chronic embolism and thrombosis of other specified deep vein of unspecified lower extremity: Secondary | ICD-10-CM | POA: Diagnosis not present

## 2016-10-10 DIAGNOSIS — D509 Iron deficiency anemia, unspecified: Secondary | ICD-10-CM | POA: Diagnosis not present

## 2016-10-10 DIAGNOSIS — Z992 Dependence on renal dialysis: Secondary | ICD-10-CM | POA: Diagnosis not present

## 2016-10-10 DIAGNOSIS — I2782 Chronic pulmonary embolism: Secondary | ICD-10-CM | POA: Diagnosis not present

## 2016-10-10 DIAGNOSIS — N186 End stage renal disease: Secondary | ICD-10-CM | POA: Diagnosis not present

## 2016-10-10 DIAGNOSIS — E1129 Type 2 diabetes mellitus with other diabetic kidney complication: Secondary | ICD-10-CM | POA: Diagnosis not present

## 2016-10-10 DIAGNOSIS — D631 Anemia in chronic kidney disease: Secondary | ICD-10-CM | POA: Diagnosis not present

## 2016-10-13 DIAGNOSIS — N2581 Secondary hyperparathyroidism of renal origin: Secondary | ICD-10-CM | POA: Diagnosis not present

## 2016-10-13 DIAGNOSIS — Z992 Dependence on renal dialysis: Secondary | ICD-10-CM | POA: Diagnosis not present

## 2016-10-13 DIAGNOSIS — D631 Anemia in chronic kidney disease: Secondary | ICD-10-CM | POA: Diagnosis not present

## 2016-10-13 DIAGNOSIS — N186 End stage renal disease: Secondary | ICD-10-CM | POA: Diagnosis not present

## 2016-10-13 DIAGNOSIS — D509 Iron deficiency anemia, unspecified: Secondary | ICD-10-CM | POA: Diagnosis not present

## 2016-10-13 DIAGNOSIS — E1129 Type 2 diabetes mellitus with other diabetic kidney complication: Secondary | ICD-10-CM | POA: Diagnosis not present

## 2016-10-15 DIAGNOSIS — I2782 Chronic pulmonary embolism: Secondary | ICD-10-CM | POA: Diagnosis not present

## 2016-10-15 DIAGNOSIS — N2581 Secondary hyperparathyroidism of renal origin: Secondary | ICD-10-CM | POA: Diagnosis not present

## 2016-10-15 DIAGNOSIS — Z992 Dependence on renal dialysis: Secondary | ICD-10-CM | POA: Diagnosis not present

## 2016-10-15 DIAGNOSIS — D509 Iron deficiency anemia, unspecified: Secondary | ICD-10-CM | POA: Diagnosis not present

## 2016-10-15 DIAGNOSIS — D631 Anemia in chronic kidney disease: Secondary | ICD-10-CM | POA: Diagnosis not present

## 2016-10-15 DIAGNOSIS — N186 End stage renal disease: Secondary | ICD-10-CM | POA: Diagnosis not present

## 2016-10-15 DIAGNOSIS — E1129 Type 2 diabetes mellitus with other diabetic kidney complication: Secondary | ICD-10-CM | POA: Diagnosis not present

## 2016-10-16 DIAGNOSIS — G4733 Obstructive sleep apnea (adult) (pediatric): Secondary | ICD-10-CM | POA: Diagnosis not present

## 2016-10-16 DIAGNOSIS — E1122 Type 2 diabetes mellitus with diabetic chronic kidney disease: Secondary | ICD-10-CM | POA: Diagnosis not present

## 2016-10-16 DIAGNOSIS — D631 Anemia in chronic kidney disease: Secondary | ICD-10-CM | POA: Diagnosis not present

## 2016-10-16 DIAGNOSIS — E1140 Type 2 diabetes mellitus with diabetic neuropathy, unspecified: Secondary | ICD-10-CM | POA: Diagnosis not present

## 2016-10-16 DIAGNOSIS — N186 End stage renal disease: Secondary | ICD-10-CM | POA: Diagnosis not present

## 2016-10-16 DIAGNOSIS — L89322 Pressure ulcer of left buttock, stage 2: Secondary | ICD-10-CM | POA: Diagnosis not present

## 2016-10-17 DIAGNOSIS — D631 Anemia in chronic kidney disease: Secondary | ICD-10-CM | POA: Diagnosis not present

## 2016-10-17 DIAGNOSIS — N186 End stage renal disease: Secondary | ICD-10-CM | POA: Diagnosis not present

## 2016-10-17 DIAGNOSIS — N2581 Secondary hyperparathyroidism of renal origin: Secondary | ICD-10-CM | POA: Diagnosis not present

## 2016-10-17 DIAGNOSIS — Z992 Dependence on renal dialysis: Secondary | ICD-10-CM | POA: Diagnosis not present

## 2016-10-17 DIAGNOSIS — E1129 Type 2 diabetes mellitus with other diabetic kidney complication: Secondary | ICD-10-CM | POA: Diagnosis not present

## 2016-10-17 DIAGNOSIS — D509 Iron deficiency anemia, unspecified: Secondary | ICD-10-CM | POA: Diagnosis not present

## 2016-10-19 ENCOUNTER — Telehealth: Payer: Self-pay | Admitting: Pulmonary Disease

## 2016-10-19 DIAGNOSIS — R0902 Hypoxemia: Secondary | ICD-10-CM

## 2016-10-19 DIAGNOSIS — G4733 Obstructive sleep apnea (adult) (pediatric): Secondary | ICD-10-CM

## 2016-10-19 NOTE — Telephone Encounter (Signed)
Called and spoke with pt and he stated that he uses oxygen at times and wanted to see if PM would be willing to [facility] other [facility] to get the POC set up for him. PM please advise. thanks

## 2016-10-20 DIAGNOSIS — D631 Anemia in chronic kidney disease: Secondary | ICD-10-CM | POA: Diagnosis not present

## 2016-10-20 DIAGNOSIS — N186 End stage renal disease: Secondary | ICD-10-CM | POA: Diagnosis not present

## 2016-10-20 DIAGNOSIS — N2581 Secondary hyperparathyroidism of renal origin: Secondary | ICD-10-CM | POA: Diagnosis not present

## 2016-10-20 DIAGNOSIS — Z992 Dependence on renal dialysis: Secondary | ICD-10-CM | POA: Diagnosis not present

## 2016-10-20 DIAGNOSIS — D509 Iron deficiency anemia, unspecified: Secondary | ICD-10-CM | POA: Diagnosis not present

## 2016-10-20 DIAGNOSIS — E1129 Type 2 diabetes mellitus with other diabetic kidney complication: Secondary | ICD-10-CM | POA: Diagnosis not present

## 2016-10-22 DIAGNOSIS — D509 Iron deficiency anemia, unspecified: Secondary | ICD-10-CM | POA: Diagnosis not present

## 2016-10-22 DIAGNOSIS — Z992 Dependence on renal dialysis: Secondary | ICD-10-CM | POA: Diagnosis not present

## 2016-10-22 DIAGNOSIS — D631 Anemia in chronic kidney disease: Secondary | ICD-10-CM | POA: Diagnosis not present

## 2016-10-22 DIAGNOSIS — E1129 Type 2 diabetes mellitus with other diabetic kidney complication: Secondary | ICD-10-CM | POA: Diagnosis not present

## 2016-10-22 DIAGNOSIS — N2581 Secondary hyperparathyroidism of renal origin: Secondary | ICD-10-CM | POA: Diagnosis not present

## 2016-10-22 DIAGNOSIS — N186 End stage renal disease: Secondary | ICD-10-CM | POA: Diagnosis not present

## 2016-10-22 NOTE — Telephone Encounter (Signed)
OK to order but I am not sure if it would be covered as he did not require supplemental oxygen once he was at adequate Bipap setting during the sleep study.  Shane Hamilton

## 2016-10-23 NOTE — Telephone Encounter (Signed)
Spoke with pt. States that his Nephrologist and his PCP want him to have the oxygen. He is aware that he would need to testing done to qualify for oxygen use. Pt verbalized understanding. Pt has been scheduled for a walk test on 10/26/16 at 11am. Order will be placed for ONO. Nothing further was needed.

## 2016-10-23 NOTE — Telephone Encounter (Signed)
Spoke with pt. He states he is currently at dialysis and will need to call us back. Will await his call back.

## 2016-10-23 NOTE — Telephone Encounter (Signed)
Pt returning call.Shane Hamilton ° °

## 2016-10-24 DIAGNOSIS — E1129 Type 2 diabetes mellitus with other diabetic kidney complication: Secondary | ICD-10-CM | POA: Diagnosis not present

## 2016-10-24 DIAGNOSIS — D631 Anemia in chronic kidney disease: Secondary | ICD-10-CM | POA: Diagnosis not present

## 2016-10-24 DIAGNOSIS — N2581 Secondary hyperparathyroidism of renal origin: Secondary | ICD-10-CM | POA: Diagnosis not present

## 2016-10-24 DIAGNOSIS — Z992 Dependence on renal dialysis: Secondary | ICD-10-CM | POA: Diagnosis not present

## 2016-10-24 DIAGNOSIS — D509 Iron deficiency anemia, unspecified: Secondary | ICD-10-CM | POA: Diagnosis not present

## 2016-10-24 DIAGNOSIS — N186 End stage renal disease: Secondary | ICD-10-CM | POA: Diagnosis not present

## 2016-10-24 DIAGNOSIS — I2782 Chronic pulmonary embolism: Secondary | ICD-10-CM | POA: Diagnosis not present

## 2016-10-26 ENCOUNTER — Telehealth: Payer: Self-pay

## 2016-10-26 ENCOUNTER — Ambulatory Visit (INDEPENDENT_AMBULATORY_CARE_PROVIDER_SITE_OTHER): Payer: Medicare Other | Admitting: Pulmonary Disease

## 2016-10-26 DIAGNOSIS — R0902 Hypoxemia: Secondary | ICD-10-CM

## 2016-10-26 NOTE — Telephone Encounter (Signed)
Order placed. Nothing further needed. 

## 2016-10-26 NOTE — Addendum Note (Signed)
Addended by: Maxwell MarionBLANKENSHIP, MARGIE A on: 10/26/2016 02:23 PM   Modules accepted: Orders

## 2016-10-26 NOTE — Telephone Encounter (Signed)
Ok to place order for supplemental O2

## 2016-10-26 NOTE — Addendum Note (Signed)
Addended by: Maxwell MarionBLANKENSHIP, MARGIE A on: 10/26/2016 01:47 PM   Modules accepted: Orders

## 2016-10-26 NOTE — Telephone Encounter (Signed)
error 

## 2016-10-26 NOTE — Telephone Encounter (Signed)
PM please advise what dx you would like this associated with? Thanks

## 2016-10-26 NOTE — Telephone Encounter (Signed)
PM- pt required 10lpm with exertion.  Pt was adequately saturated on room air at rest.  Ok to place order for 02?  Thanks.

## 2016-10-27 DIAGNOSIS — D509 Iron deficiency anemia, unspecified: Secondary | ICD-10-CM | POA: Diagnosis not present

## 2016-10-27 DIAGNOSIS — N186 End stage renal disease: Secondary | ICD-10-CM | POA: Diagnosis not present

## 2016-10-27 DIAGNOSIS — D689 Coagulation defect, unspecified: Secondary | ICD-10-CM | POA: Diagnosis not present

## 2016-10-27 DIAGNOSIS — N2581 Secondary hyperparathyroidism of renal origin: Secondary | ICD-10-CM | POA: Diagnosis not present

## 2016-10-27 DIAGNOSIS — E1129 Type 2 diabetes mellitus with other diabetic kidney complication: Secondary | ICD-10-CM | POA: Diagnosis not present

## 2016-10-27 DIAGNOSIS — Z992 Dependence on renal dialysis: Secondary | ICD-10-CM | POA: Diagnosis not present

## 2016-10-27 DIAGNOSIS — D631 Anemia in chronic kidney disease: Secondary | ICD-10-CM | POA: Diagnosis not present

## 2016-10-28 ENCOUNTER — Telehealth: Payer: Self-pay | Admitting: Pulmonary Disease

## 2016-10-28 NOTE — Telephone Encounter (Signed)
Spoke with the pt  He is requesting POC order  Please advise if this is okay, thanks

## 2016-10-29 DIAGNOSIS — D631 Anemia in chronic kidney disease: Secondary | ICD-10-CM | POA: Diagnosis not present

## 2016-10-29 DIAGNOSIS — N186 End stage renal disease: Secondary | ICD-10-CM | POA: Diagnosis not present

## 2016-10-29 DIAGNOSIS — E1129 Type 2 diabetes mellitus with other diabetic kidney complication: Secondary | ICD-10-CM | POA: Diagnosis not present

## 2016-10-29 DIAGNOSIS — D509 Iron deficiency anemia, unspecified: Secondary | ICD-10-CM | POA: Diagnosis not present

## 2016-10-29 DIAGNOSIS — Z992 Dependence on renal dialysis: Secondary | ICD-10-CM | POA: Diagnosis not present

## 2016-10-29 DIAGNOSIS — N2581 Secondary hyperparathyroidism of renal origin: Secondary | ICD-10-CM | POA: Diagnosis not present

## 2016-10-31 DIAGNOSIS — N186 End stage renal disease: Secondary | ICD-10-CM | POA: Diagnosis not present

## 2016-10-31 DIAGNOSIS — D689 Coagulation defect, unspecified: Secondary | ICD-10-CM | POA: Diagnosis not present

## 2016-10-31 DIAGNOSIS — D509 Iron deficiency anemia, unspecified: Secondary | ICD-10-CM | POA: Diagnosis not present

## 2016-10-31 DIAGNOSIS — E1129 Type 2 diabetes mellitus with other diabetic kidney complication: Secondary | ICD-10-CM | POA: Diagnosis not present

## 2016-10-31 DIAGNOSIS — Z992 Dependence on renal dialysis: Secondary | ICD-10-CM | POA: Diagnosis not present

## 2016-10-31 DIAGNOSIS — N2581 Secondary hyperparathyroidism of renal origin: Secondary | ICD-10-CM | POA: Diagnosis not present

## 2016-10-31 DIAGNOSIS — D631 Anemia in chronic kidney disease: Secondary | ICD-10-CM | POA: Diagnosis not present

## 2016-11-02 NOTE — Telephone Encounter (Signed)
[phone] pt calling back

## 2016-11-02 NOTE — Telephone Encounter (Signed)
The order for this has already been addressed and ordered. Spoke with pt and made him aware of this. States that [facility] and not a POC. Advised him that is because he is needing 10L with exertion and a POC can't accommodate that. [facility]. Nothing further was needed at this time.

## 2016-11-03 DIAGNOSIS — D631 Anemia in chronic kidney disease: Secondary | ICD-10-CM | POA: Diagnosis not present

## 2016-11-03 DIAGNOSIS — N186 End stage renal disease: Secondary | ICD-10-CM | POA: Diagnosis not present

## 2016-11-03 DIAGNOSIS — E1129 Type 2 diabetes mellitus with other diabetic kidney complication: Secondary | ICD-10-CM | POA: Diagnosis not present

## 2016-11-03 DIAGNOSIS — Z992 Dependence on renal dialysis: Secondary | ICD-10-CM | POA: Diagnosis not present

## 2016-11-03 DIAGNOSIS — D509 Iron deficiency anemia, unspecified: Secondary | ICD-10-CM | POA: Diagnosis not present

## 2016-11-03 DIAGNOSIS — N2581 Secondary hyperparathyroidism of renal origin: Secondary | ICD-10-CM | POA: Diagnosis not present

## 2016-11-04 DIAGNOSIS — Z992 Dependence on renal dialysis: Secondary | ICD-10-CM | POA: Diagnosis not present

## 2016-11-04 DIAGNOSIS — E1129 Type 2 diabetes mellitus with other diabetic kidney complication: Secondary | ICD-10-CM | POA: Diagnosis not present

## 2016-11-04 DIAGNOSIS — N186 End stage renal disease: Secondary | ICD-10-CM | POA: Diagnosis not present

## 2016-11-05 DIAGNOSIS — I2782 Chronic pulmonary embolism: Secondary | ICD-10-CM | POA: Diagnosis not present

## 2016-11-05 DIAGNOSIS — D509 Iron deficiency anemia, unspecified: Secondary | ICD-10-CM | POA: Diagnosis not present

## 2016-11-05 DIAGNOSIS — D631 Anemia in chronic kidney disease: Secondary | ICD-10-CM | POA: Diagnosis not present

## 2016-11-05 DIAGNOSIS — N186 End stage renal disease: Secondary | ICD-10-CM | POA: Diagnosis not present

## 2016-11-05 DIAGNOSIS — N2581 Secondary hyperparathyroidism of renal origin: Secondary | ICD-10-CM | POA: Diagnosis not present

## 2016-11-07 DIAGNOSIS — D631 Anemia in chronic kidney disease: Secondary | ICD-10-CM | POA: Diagnosis not present

## 2016-11-07 DIAGNOSIS — N186 End stage renal disease: Secondary | ICD-10-CM | POA: Diagnosis not present

## 2016-11-07 DIAGNOSIS — N2581 Secondary hyperparathyroidism of renal origin: Secondary | ICD-10-CM | POA: Diagnosis not present

## 2016-11-07 DIAGNOSIS — D509 Iron deficiency anemia, unspecified: Secondary | ICD-10-CM | POA: Diagnosis not present

## 2016-11-10 DIAGNOSIS — N2581 Secondary hyperparathyroidism of renal origin: Secondary | ICD-10-CM | POA: Diagnosis not present

## 2016-11-10 DIAGNOSIS — D631 Anemia in chronic kidney disease: Secondary | ICD-10-CM | POA: Diagnosis not present

## 2016-11-10 DIAGNOSIS — D509 Iron deficiency anemia, unspecified: Secondary | ICD-10-CM | POA: Diagnosis not present

## 2016-11-10 DIAGNOSIS — N186 End stage renal disease: Secondary | ICD-10-CM | POA: Diagnosis not present

## 2016-11-12 DIAGNOSIS — D509 Iron deficiency anemia, unspecified: Secondary | ICD-10-CM | POA: Diagnosis not present

## 2016-11-12 DIAGNOSIS — I2782 Chronic pulmonary embolism: Secondary | ICD-10-CM | POA: Diagnosis not present

## 2016-11-12 DIAGNOSIS — D631 Anemia in chronic kidney disease: Secondary | ICD-10-CM | POA: Diagnosis not present

## 2016-11-12 DIAGNOSIS — N2581 Secondary hyperparathyroidism of renal origin: Secondary | ICD-10-CM | POA: Diagnosis not present

## 2016-11-12 DIAGNOSIS — N186 End stage renal disease: Secondary | ICD-10-CM | POA: Diagnosis not present

## 2016-11-14 DIAGNOSIS — D631 Anemia in chronic kidney disease: Secondary | ICD-10-CM | POA: Diagnosis not present

## 2016-11-14 DIAGNOSIS — D509 Iron deficiency anemia, unspecified: Secondary | ICD-10-CM | POA: Diagnosis not present

## 2016-11-14 DIAGNOSIS — N2581 Secondary hyperparathyroidism of renal origin: Secondary | ICD-10-CM | POA: Diagnosis not present

## 2016-11-14 DIAGNOSIS — N186 End stage renal disease: Secondary | ICD-10-CM | POA: Diagnosis not present

## 2016-11-17 DIAGNOSIS — D509 Iron deficiency anemia, unspecified: Secondary | ICD-10-CM | POA: Diagnosis not present

## 2016-11-17 DIAGNOSIS — N2581 Secondary hyperparathyroidism of renal origin: Secondary | ICD-10-CM | POA: Diagnosis not present

## 2016-11-17 DIAGNOSIS — D631 Anemia in chronic kidney disease: Secondary | ICD-10-CM | POA: Diagnosis not present

## 2016-11-17 DIAGNOSIS — N186 End stage renal disease: Secondary | ICD-10-CM | POA: Diagnosis not present

## 2016-11-19 DIAGNOSIS — D509 Iron deficiency anemia, unspecified: Secondary | ICD-10-CM | POA: Diagnosis not present

## 2016-11-19 DIAGNOSIS — N186 End stage renal disease: Secondary | ICD-10-CM | POA: Diagnosis not present

## 2016-11-19 DIAGNOSIS — N2581 Secondary hyperparathyroidism of renal origin: Secondary | ICD-10-CM | POA: Diagnosis not present

## 2016-11-19 DIAGNOSIS — I2782 Chronic pulmonary embolism: Secondary | ICD-10-CM | POA: Diagnosis not present

## 2016-11-19 DIAGNOSIS — D631 Anemia in chronic kidney disease: Secondary | ICD-10-CM | POA: Diagnosis not present

## 2016-11-21 DIAGNOSIS — N2581 Secondary hyperparathyroidism of renal origin: Secondary | ICD-10-CM | POA: Diagnosis not present

## 2016-11-21 DIAGNOSIS — D631 Anemia in chronic kidney disease: Secondary | ICD-10-CM | POA: Diagnosis not present

## 2016-11-21 DIAGNOSIS — N186 End stage renal disease: Secondary | ICD-10-CM | POA: Diagnosis not present

## 2016-11-21 DIAGNOSIS — D509 Iron deficiency anemia, unspecified: Secondary | ICD-10-CM | POA: Diagnosis not present

## 2016-11-24 DIAGNOSIS — N2581 Secondary hyperparathyroidism of renal origin: Secondary | ICD-10-CM | POA: Diagnosis not present

## 2016-11-24 DIAGNOSIS — D509 Iron deficiency anemia, unspecified: Secondary | ICD-10-CM | POA: Diagnosis not present

## 2016-11-24 DIAGNOSIS — D631 Anemia in chronic kidney disease: Secondary | ICD-10-CM | POA: Diagnosis not present

## 2016-11-24 DIAGNOSIS — N186 End stage renal disease: Secondary | ICD-10-CM | POA: Diagnosis not present

## 2016-11-25 ENCOUNTER — Other Ambulatory Visit: Payer: Self-pay

## 2016-11-25 ENCOUNTER — Ambulatory Visit (INDEPENDENT_AMBULATORY_CARE_PROVIDER_SITE_OTHER): Payer: Medicare Other | Admitting: Vascular Surgery

## 2016-11-25 ENCOUNTER — Encounter: Payer: Self-pay | Admitting: Vascular Surgery

## 2016-11-25 VITALS — BP 151/97 | HR 103 | Temp 97.2°F | Resp 20 | Ht 71.0 in | Wt >= 6400 oz

## 2016-11-25 DIAGNOSIS — N186 End stage renal disease: Secondary | ICD-10-CM | POA: Diagnosis not present

## 2016-11-25 DIAGNOSIS — Z992 Dependence on renal dialysis: Secondary | ICD-10-CM

## 2016-11-25 NOTE — Progress Notes (Addendum)
 Patient name: Shane Hamilton MRN: [id number] DOB: 04/21/1966 Sex: male  REASON FOR VISIT: Evaluate for hemodialysis access. Referred by Dr. Cynthia Dunham.  HPI: Shane Hamilton is a 51 y.o. male who presents for evaluation for new access. He currently has a right femoral tunneled dialysis catheter. He did have this exchanged and angioplasty of a fibrin sheath on 05/15/2016. He has not had access in the right arm but has unknown right central venous occlusion. He had a fistula on the left which appears to be a brachiocephalic fistula which ultimately could not be salvaged.  He denies any recent uremic symptoms. Specifically he denies nausea, vomiting, fatigue, anorexia, or palpitations.  Past Medical History:  Diagnosis Date  . Abscess of right arm 06/17/2015  . Anemia   . Anxiety   . Arteriovenous fistula infection (HCC)   . Cardiomegaly   . Chronic anticoagulation   . Chronic pancreatitis (HCC)   . DVT (deep venous thrombosis) (HCC) 2011  . ESRD (end stage renal disease) (HCC)    [facility] [place]  . GERD (gastroesophageal reflux disease)   . Gout   . Headache    migraines  . Hypertension   . Kidney stones   . LOC (loss of consciousness) (HCC)   . Morbid obesity (HCC)   . OSA (obstructive sleep apnea)    not using cpap  . Pneumonia   . Secondary hyperparathyroidism (HCC)   . Seizures (HCC)    pt denies  . Septic shock(785.52)   . Shortness of breath dyspnea    with exertion  . Type 2 diabetes mellitus, uncontrolled (HCC)         Family History  Problem Relation Age of Onset  . Diabetes Mother   . Hypertension Mother   . Diabetes Father   . Hypertension Father   . Hypertension Brother     SOCIAL HISTORY: Social History  Substance Use Topics  . Smoking status: Never Smoker  . Smokeless tobacco: Never Used  . Alcohol use No    Allergies  Allergen Reactions  . Aspirin Other (See Comments)    Reaction:  GI upset     Current Outpatient  Prescriptions  Medication Sig Dispense Refill  . allopurinol (ZYLOPRIM) 300 MG tablet Take 300 mg by mouth 2 (two) times daily.    . atorvastatin (LIPITOR) 10 MG tablet Take 10 mg by mouth daily.    . BD PEN NEEDLE NANO U/F 32G X 4 MM MISC     . calcium acetate (PHOSLO) 667 MG capsule Take 2,001 mg by mouth 3 (three) times daily with meals.     . carvedilol (COREG) 3.125 MG tablet Take 1 tablet (3.125 mg total) by mouth 2 (two) times daily with a meal. 60 tablet 0  . cinacalcet (SENSIPAR) 60 MG tablet Take 60 mg by mouth daily.    . clonazePAM (KLONOPIN) 1 MG tablet Take 0.5 tablets (0.5 mg total) by mouth 2 (two) times daily as needed for anxiety. 30 tablet 0  . colchicine 0.6 MG tablet Take 0.6 mg by mouth 3 (three) times daily.     . gabapentin (NEURONTIN) 300 MG capsule Take 1 tablet daily and extra 1 tablet after dialysis.    . ibuprofen (ADVIL,MOTRIN) 600 MG tablet TK 1 T PO TID FOR PAIN. DO NOT EXCEED 5 TABLETS IN 24 HOURS  0  . insulin glargine (LANTUS) 100 UNIT/ML injection Inject 0.35 mLs (35 Units total) into the skin at bedtime. (Patient taking differently:   Inject 35 Units into the skin at bedtime. Pt takes 70 units at bedtime) 10 mL 11  . lanthanum (FOSRENOL) 1000 MG chewable tablet Chew 1,000 mg by mouth 3 (three) times daily with meals.     . LEVEMIR FLEXTOUCH 100 UNIT/ML Pen INJECT 70 UNITS UNDER THE SKIN QHS  6  . lidocaine (XYLOCAINE) 5 % ointment Apply to feet twice daily as needed.  Use with gloves/cotton swab 50 g 3  . multivitamin (RENA-VIT) TABS tablet Take 1 tablet by mouth daily.   3  . nortriptyline (PAMELOR) 10 MG capsule Take 1 capsule (10 mg total) by mouth at bedtime. 90 capsule 3  . NOVOLOG FLEXPEN 100 UNIT/ML FlexPen Inject 35 Units as directed 3 (three) times daily with meals.    . omeprazole (PRILOSEC) 20 MG capsule Take 20 mg by mouth 2 (two) times daily before a meal.     . polyethylene glycol (MIRALAX / GLYCOLAX) packet Take 17 g by mouth daily as needed for  mild constipation. 14 each 0  . traMADol (ULTRAM) 50 MG tablet TK 1 TO 2 TS PO Q 6 H PRN P  0  . warfarin (COUMADIN) 10 MG tablet Take 10 mg by mouth daily.     No current facility-administered medications for this visit.     REVIEW OF SYSTEMS:  [X] denotes positive finding, [ ] denotes negative finding Cardiac  Comments:  Chest pain or chest pressure:    Shortness of breath upon exertion: X   Short of breath when lying flat:    Irregular heart rhythm:        Vascular    Pain in calf, thigh, or hip brought on by ambulation: X   Pain in feet at night that wakes you up from your sleep:     Blood clot in your veins:    Leg swelling:         Pulmonary    Oxygen at home:    Productive cough:     Wheezing:         Neurologic    Sudden weakness in arms or legs:     Sudden numbness in arms or legs:     Sudden onset of difficulty speaking or slurred speech:    Temporary loss of vision in one eye:     Problems with dizziness:         Gastrointestinal    Blood in stool:     Vomited blood:         Genitourinary    Burning when urinating:     Blood in urine:        Psychiatric    Major depression:         Hematologic    Bleeding problems:    Problems with blood clotting too easily:        Skin    Rashes or ulcers:        Constitutional    Fever or chills:      PHYSICAL EXAM: Vitals:   11/25/16 0921 11/25/16 0923  BP: (!) 150/110 (!) 151/97  Pulse: (!) 103   Resp: 20   Temp: 97.2 F (36.2 C)   TempSrc: Oral   SpO2: 96%   Weight: (!) 411 lb 9.6 oz (186.7 kg)   Height: 5' 11" (1.803 m)     GENERAL: The patient is a Morbidly obese male, in no acute distress. The vital signs are documented above. CARDIAC: There is a regular rate and rhythm.    VASCULAR: He has a palpable radial pulse on the left. I cannot palpate a radial pulse on the right. PULMONARY: There is good air exchange bilaterally without wheezing or rales. ABDOMEN: Soft and non-tender with normal  pitched bowel sounds.  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 NEUROLOGIC: No focal weakness or paresthesias are detected. SKIN: There are no ulcers or rashes noted. PSYCHIATRIC: The patient has a normal affect.  DATA:   VENOGRAM: This patient had a bilateral central venogram on 09/13/2015. At that time the right innominate vein and subclavian veins were occluded. The left subclavian and innominate veins were patent.  Upper extremity duplex: In 2016, he had triphasic signals in the radial and ulnar positions bilaterally.  MEDICAL ISSUES:  STAGE V CHRONIC KIDNEY DISEASE: This patient is not a candidate for access in the right arm given the right central venous occlusion. He is a poor candidate for a thigh graft given his obesity. It looks like he could potentially have a graft in his left arm. I have scheduled this for 12/04/16. We have discussed the procedure and potential complications and he is agreeable to proceed. We will hold his Coumadin 4 days prior to surgery.  Sameria Morss [facility] [phone]   

## 2016-11-26 DIAGNOSIS — Z5181 Encounter for therapeutic drug level monitoring: Secondary | ICD-10-CM | POA: Diagnosis not present

## 2016-11-26 DIAGNOSIS — N2581 Secondary hyperparathyroidism of renal origin: Secondary | ICD-10-CM | POA: Diagnosis not present

## 2016-11-26 DIAGNOSIS — I82599 Chronic embolism and thrombosis of other specified deep vein of unspecified lower extremity: Secondary | ICD-10-CM | POA: Diagnosis not present

## 2016-11-26 DIAGNOSIS — Z7901 Long term (current) use of anticoagulants: Secondary | ICD-10-CM | POA: Diagnosis not present

## 2016-11-26 DIAGNOSIS — N186 End stage renal disease: Secondary | ICD-10-CM | POA: Diagnosis not present

## 2016-11-26 DIAGNOSIS — D631 Anemia in chronic kidney disease: Secondary | ICD-10-CM | POA: Diagnosis not present

## 2016-11-26 DIAGNOSIS — D509 Iron deficiency anemia, unspecified: Secondary | ICD-10-CM | POA: Diagnosis not present

## 2016-11-28 DIAGNOSIS — D509 Iron deficiency anemia, unspecified: Secondary | ICD-10-CM | POA: Diagnosis not present

## 2016-11-28 DIAGNOSIS — N2581 Secondary hyperparathyroidism of renal origin: Secondary | ICD-10-CM | POA: Diagnosis not present

## 2016-11-28 DIAGNOSIS — D631 Anemia in chronic kidney disease: Secondary | ICD-10-CM | POA: Diagnosis not present

## 2016-11-28 DIAGNOSIS — N186 End stage renal disease: Secondary | ICD-10-CM | POA: Diagnosis not present

## 2016-12-01 DIAGNOSIS — N2581 Secondary hyperparathyroidism of renal origin: Secondary | ICD-10-CM | POA: Diagnosis not present

## 2016-12-01 DIAGNOSIS — D509 Iron deficiency anemia, unspecified: Secondary | ICD-10-CM | POA: Diagnosis not present

## 2016-12-01 DIAGNOSIS — N186 End stage renal disease: Secondary | ICD-10-CM | POA: Diagnosis not present

## 2016-12-01 DIAGNOSIS — D631 Anemia in chronic kidney disease: Secondary | ICD-10-CM | POA: Diagnosis not present

## 2016-12-02 DIAGNOSIS — E1129 Type 2 diabetes mellitus with other diabetic kidney complication: Secondary | ICD-10-CM | POA: Diagnosis not present

## 2016-12-02 DIAGNOSIS — Z992 Dependence on renal dialysis: Secondary | ICD-10-CM | POA: Diagnosis not present

## 2016-12-02 DIAGNOSIS — N186 End stage renal disease: Secondary | ICD-10-CM | POA: Diagnosis not present

## 2016-12-03 ENCOUNTER — Encounter (HOSPITAL_COMMUNITY): Payer: Self-pay | Admitting: *Deleted

## 2016-12-03 DIAGNOSIS — E1129 Type 2 diabetes mellitus with other diabetic kidney complication: Secondary | ICD-10-CM | POA: Diagnosis not present

## 2016-12-03 DIAGNOSIS — D509 Iron deficiency anemia, unspecified: Secondary | ICD-10-CM | POA: Diagnosis not present

## 2016-12-03 DIAGNOSIS — I2782 Chronic pulmonary embolism: Secondary | ICD-10-CM | POA: Diagnosis not present

## 2016-12-03 DIAGNOSIS — N2581 Secondary hyperparathyroidism of renal origin: Secondary | ICD-10-CM | POA: Diagnosis not present

## 2016-12-03 DIAGNOSIS — N186 End stage renal disease: Secondary | ICD-10-CM | POA: Diagnosis not present

## 2016-12-03 DIAGNOSIS — D631 Anemia in chronic kidney disease: Secondary | ICD-10-CM | POA: Diagnosis not present

## 2016-12-03 NOTE — Progress Notes (Signed)
Spoke with pt for pre-op call. Pt denies cardiac history, chest pain or sob. Pt is a type 2 diabetic. He is to ask the dialysis nurse what his most recent A1C was and will give us that info in the AM. States his fasting blood sugar is usually between 70-80. Pt instructed to take 1/2 of his regular dose of Levemir Insulin tonight (will take 35 units) and no Novolog in the AM. If blood sugar is 70 or below, treat with 1/2 cup of clear juice (apple or cranberry) and recheck blood sugar 15 minutes after drinking juice. If blood sugar continues to be 70 or below, call the Short Stay department and ask to speak to a nurse. Pt voiced understanding. Pt does have a hx of DVT and is on Coumadin. Last dose was 11/29/16.

## 2016-12-04 ENCOUNTER — Ambulatory Visit (HOSPITAL_COMMUNITY)
Admission: RE | Admit: 2016-12-04 | Discharge: 2016-12-04 | Disposition: A | Discharge status: Home / Self Care | Payer: Medicare Other | Source: Ambulatory Visit | Attending: Vascular Surgery | Admitting: Vascular Surgery

## 2016-12-04 ENCOUNTER — Telehealth: Payer: Self-pay | Admitting: Vascular Surgery

## 2016-12-04 ENCOUNTER — Ambulatory Visit (HOSPITAL_COMMUNITY): Payer: Medicare Other | Admitting: Certified Registered Nurse Anesthetist

## 2016-12-04 ENCOUNTER — Encounter: Payer: Self-pay | Admitting: Nurse Practitioner

## 2016-12-04 ENCOUNTER — Other Ambulatory Visit: Payer: Self-pay | Admitting: *Deleted

## 2016-12-04 ENCOUNTER — Encounter (HOSPITAL_COMMUNITY): Payer: Self-pay | Admitting: Certified Registered Nurse Anesthetist

## 2016-12-04 ENCOUNTER — Encounter (HOSPITAL_COMMUNITY)
Admission: RE | Disposition: A | Discharge status: Home / Self Care | Payer: Self-pay | Source: Ambulatory Visit | Attending: Vascular Surgery

## 2016-12-04 DIAGNOSIS — Z794 Long term (current) use of insulin: Secondary | ICD-10-CM | POA: Diagnosis not present

## 2016-12-04 DIAGNOSIS — N186 End stage renal disease: Secondary | ICD-10-CM | POA: Diagnosis not present

## 2016-12-04 DIAGNOSIS — E1122 Type 2 diabetes mellitus with diabetic chronic kidney disease: Secondary | ICD-10-CM | POA: Insufficient documentation

## 2016-12-04 DIAGNOSIS — Z6841 Body Mass Index (BMI) 40.0 and over, adult: Secondary | ICD-10-CM | POA: Insufficient documentation

## 2016-12-04 DIAGNOSIS — Z8619 Personal history of other infectious and parasitic diseases: Secondary | ICD-10-CM | POA: Insufficient documentation

## 2016-12-04 DIAGNOSIS — Z886 Allergy status to analgesic agent status: Secondary | ICD-10-CM | POA: Insufficient documentation

## 2016-12-04 DIAGNOSIS — M109 Gout, unspecified: Secondary | ICD-10-CM | POA: Diagnosis not present

## 2016-12-04 DIAGNOSIS — F419 Anxiety disorder, unspecified: Secondary | ICD-10-CM | POA: Diagnosis not present

## 2016-12-04 DIAGNOSIS — I120 Hypertensive chronic kidney disease with stage 5 chronic kidney disease or end stage renal disease: Secondary | ICD-10-CM | POA: Diagnosis not present

## 2016-12-04 DIAGNOSIS — K219 Gastro-esophageal reflux disease without esophagitis: Secondary | ICD-10-CM | POA: Diagnosis not present

## 2016-12-04 DIAGNOSIS — I82722 Chronic embolism and thrombosis of deep veins of left upper extremity: Secondary | ICD-10-CM | POA: Diagnosis not present

## 2016-12-04 DIAGNOSIS — Z79899 Other long term (current) drug therapy: Secondary | ICD-10-CM | POA: Diagnosis not present

## 2016-12-04 DIAGNOSIS — N185 Chronic kidney disease, stage 5: Secondary | ICD-10-CM | POA: Diagnosis not present

## 2016-12-04 DIAGNOSIS — Z833 Family history of diabetes mellitus: Secondary | ICD-10-CM | POA: Insufficient documentation

## 2016-12-04 DIAGNOSIS — K861 Other chronic pancreatitis: Secondary | ICD-10-CM | POA: Diagnosis not present

## 2016-12-04 DIAGNOSIS — I1311 Hypertensive heart and chronic kidney disease without heart failure, with stage 5 chronic kidney disease, or end stage renal disease: Secondary | ICD-10-CM | POA: Diagnosis not present

## 2016-12-04 DIAGNOSIS — Z7901 Long term (current) use of anticoagulants: Secondary | ICD-10-CM | POA: Diagnosis not present

## 2016-12-04 DIAGNOSIS — N2581 Secondary hyperparathyroidism of renal origin: Secondary | ICD-10-CM | POA: Diagnosis not present

## 2016-12-04 DIAGNOSIS — Z992 Dependence on renal dialysis: Secondary | ICD-10-CM | POA: Diagnosis not present

## 2016-12-04 DIAGNOSIS — Z8249 Family history of ischemic heart disease and other diseases of the circulatory system: Secondary | ICD-10-CM | POA: Diagnosis not present

## 2016-12-04 DIAGNOSIS — G4733 Obstructive sleep apnea (adult) (pediatric): Secondary | ICD-10-CM | POA: Insufficient documentation

## 2016-12-04 DIAGNOSIS — Z01810 Encounter for preprocedural cardiovascular examination: Secondary | ICD-10-CM

## 2016-12-04 DIAGNOSIS — T827XXA Infection and inflammatory reaction due to other cardiac and vascular devices, implants and grafts, initial encounter: Secondary | ICD-10-CM | POA: Diagnosis not present

## 2016-12-04 DIAGNOSIS — K8681 Exocrine pancreatic insufficiency: Secondary | ICD-10-CM | POA: Diagnosis not present

## 2016-12-04 DIAGNOSIS — Z87442 Personal history of urinary calculi: Secondary | ICD-10-CM | POA: Diagnosis not present

## 2016-12-04 HISTORY — DX: Personal history of urinary calculi: Z87.442

## 2016-12-04 HISTORY — PX: AV FISTULA PLACEMENT: SHX1204

## 2016-12-04 LAB — POCT I-STAT 4, (NA,K, GLUC, HGB,HCT)
Glucose, Bld: 126 mg/dL — ABNORMAL HIGH (ref 65–99)
HEMATOCRIT: 33 % — AB (ref 39.0–52.0)
Hemoglobin: 11.2 g/dL — ABNORMAL LOW (ref 13.0–17.0)
Potassium: 3.8 mmol/L (ref 3.5–5.1)
SODIUM: 137 mmol/L (ref 135–145)

## 2016-12-04 LAB — PROTIME-INR
INR: 1.66
PROTHROMBIN TIME: 19.8 s — AB (ref 11.4–15.2)

## 2016-12-04 LAB — GLUCOSE, CAPILLARY
Glucose-Capillary: 116 mg/dL — ABNORMAL HIGH (ref 65–99)
Glucose-Capillary: 122 mg/dL — ABNORMAL HIGH (ref 65–99)

## 2016-12-04 LAB — APTT

## 2016-12-04 SURGERY — INSERTION OF ARTERIOVENOUS (AV) GORE-TEX GRAFT ARM
Anesthesia: General | Site: Arm Upper | Laterality: Left

## 2016-12-04 MED ORDER — SUCCINYLCHOLINE CHLORIDE 20 MG/ML IJ SOLN
INTRAMUSCULAR | Status: DC | PRN
  Administered 2016-12-04: 180 mg via INTRAVENOUS

## 2016-12-04 MED ORDER — MIDAZOLAM HCL 2 MG/2ML IJ SOLN: 0.5000 mg | Freq: Once | INTRAMUSCULAR | Status: DC | PRN

## 2016-12-04 MED ORDER — PROPOFOL 10 MG/ML IV BOLUS
INTRAVENOUS | Status: AC
  Filled 2016-12-04: qty 20

## 2016-12-04 MED ORDER — LIDOCAINE-EPINEPHRINE (PF) 1 %-1:200000 IJ SOLN
INTRAMUSCULAR | Status: AC
  Filled 2016-12-04: qty 30

## 2016-12-04 MED ORDER — FENTANYL CITRATE (PF) 100 MCG/2ML IJ SOLN
INTRAMUSCULAR | Status: DC | PRN
  Administered 2016-12-04: 75 ug via INTRAVENOUS
  Administered 2016-12-04: 25 ug via INTRAVENOUS

## 2016-12-04 MED ORDER — SODIUM CHLORIDE 0.9 % IV SOLN
INTRAVENOUS | Status: DC
  Administered 2016-12-04 (×2): via INTRAVENOUS
  Administered 2016-12-04: 10 mL/h via INTRAVENOUS

## 2016-12-04 MED ORDER — PROMETHAZINE HCL 25 MG/ML IJ SOLN: 6.2500 mg | INTRAMUSCULAR | Status: DC | PRN

## 2016-12-04 MED ORDER — 0.9 % SODIUM CHLORIDE (POUR BTL) OPTIME
TOPICAL | Status: DC | PRN
  Administered 2016-12-04: 1000 mL

## 2016-12-04 MED ORDER — MIDAZOLAM HCL 2 MG/2ML IJ SOLN
INTRAMUSCULAR | Status: AC
  Filled 2016-12-04: qty 2

## 2016-12-04 MED ORDER — ALBUMIN HUMAN 5 % IV SOLN
INTRAVENOUS | Status: DC | PRN
  Administered 2016-12-04: 11:00:00 via INTRAVENOUS

## 2016-12-04 MED ORDER — GABAPENTIN 300 MG PO CAPS: 300.0000 mg | ORAL_CAPSULE | Freq: Three times a day (TID) | ORAL | Status: AC | PRN

## 2016-12-04 MED ORDER — CHLORHEXIDINE GLUCONATE CLOTH 2 % EX PADS: 6.0000 | MEDICATED_PAD | Freq: Once | CUTANEOUS | Status: DC

## 2016-12-04 MED ORDER — MEPERIDINE HCL 25 MG/ML IJ SOLN: 6.2500 mg | INTRAMUSCULAR | Status: DC | PRN

## 2016-12-04 MED ORDER — DEXTROSE 5 % IV SOLN
INTRAVENOUS | Status: DC | PRN
  Administered 2016-12-04: 20 ug/min via INTRAVENOUS

## 2016-12-04 MED ORDER — ROCURONIUM BROMIDE 50 MG/5ML IV SOSY
PREFILLED_SYRINGE | INTRAVENOUS | Status: AC
  Filled 2016-12-04: qty 5

## 2016-12-04 MED ORDER — CLONAZEPAM 1 MG PO TABS: 1.0000 mg | ORAL_TABLET | Freq: Two times a day (BID) | ORAL | Status: AC | PRN

## 2016-12-04 MED ORDER — DEXTROSE 5 % IV SOLN
1.5000 g | INTRAVENOUS | Status: AC
  Administered 2016-12-04: 1.5 g via INTRAVENOUS

## 2016-12-04 MED ORDER — LIDOCAINE-EPINEPHRINE (PF) 1 %-1:200000 IJ SOLN
INTRAMUSCULAR | Status: DC | PRN
  Administered 2016-12-04: 30 mL

## 2016-12-04 MED ORDER — FENTANYL CITRATE (PF) 100 MCG/2ML IJ SOLN: 25.0000 ug | INTRAMUSCULAR | Status: DC | PRN

## 2016-12-04 MED ORDER — FENTANYL CITRATE (PF) 100 MCG/2ML IJ SOLN
INTRAMUSCULAR | Status: AC
  Filled 2016-12-04: qty 2

## 2016-12-04 MED ORDER — MIDAZOLAM HCL 5 MG/5ML IJ SOLN
INTRAMUSCULAR | Status: DC | PRN
  Administered 2016-12-04: 1 mg via INTRAVENOUS

## 2016-12-04 MED ORDER — ONDANSETRON HCL 4 MG/2ML IJ SOLN
INTRAMUSCULAR | Status: DC | PRN
Start: 2016-12-04 — End: 2016-12-04
  Administered 2016-12-04: 4 mg via INTRAVENOUS

## 2016-12-04 MED ORDER — SODIUM CHLORIDE 0.9 % IV SOLN
INTRAVENOUS | Status: DC | PRN
  Administered 2016-12-04: 11:00:00

## 2016-12-04 MED ORDER — WARFARIN SODIUM 7.5 MG PO TABS: 7.5000 mg | ORAL_TABLET | Freq: Every day | ORAL | Status: DC

## 2016-12-04 MED ORDER — PHENYLEPHRINE HCL 10 MG/ML IJ SOLN
INTRAMUSCULAR | Status: DC | PRN
  Administered 2016-12-04: 120 ug via INTRAVENOUS

## 2016-12-04 MED ORDER — CISATRACURIUM BESYLATE 20 MG/10ML IV SOLN
INTRAVENOUS | Status: AC
  Filled 2016-12-04: qty 10

## 2016-12-04 MED ORDER — PROPOFOL 10 MG/ML IV BOLUS
INTRAVENOUS | Status: DC | PRN
  Administered 2016-12-04: 200 mg via INTRAVENOUS

## 2016-12-04 MED ORDER — EPHEDRINE 5 MG/ML INJ
INTRAVENOUS | Status: AC
  Filled 2016-12-04: qty 10

## 2016-12-04 MED ORDER — SUCCINYLCHOLINE CHLORIDE 200 MG/10ML IV SOSY
PREFILLED_SYRINGE | INTRAVENOUS | Status: AC
  Filled 2016-12-04: qty 20

## 2016-12-04 MED ORDER — EPHEDRINE SULFATE 50 MG/ML IJ SOLN
INTRAMUSCULAR | Status: DC | PRN
  Administered 2016-12-04 (×2): 10 mg via INTRAVENOUS

## 2016-12-04 MED ORDER — LIDOCAINE HCL (CARDIAC) 20 MG/ML IV SOLN
INTRAVENOUS | Status: DC | PRN
  Administered 2016-12-04: 50 mg via INTRATRACHEAL

## 2016-12-04 MED ORDER — TRAMADOL HCL 50 MG PO TABS: 50.0000 mg | ORAL_TABLET | Freq: Four times a day (QID) | ORAL | 0 refills | Status: DC | PRN

## 2016-12-04 MED ORDER — ONDANSETRON HCL 4 MG/2ML IJ SOLN
INTRAMUSCULAR | Status: AC
  Filled 2016-12-04: qty 4

## 2016-12-04 MED ORDER — PHENYLEPHRINE 40 MCG/ML (10ML) SYRINGE FOR IV PUSH (FOR BLOOD PRESSURE SUPPORT)
PREFILLED_SYRINGE | INTRAVENOUS | Status: AC
  Filled 2016-12-04: qty 30

## 2016-12-04 MED ORDER — LIDOCAINE 2% (20 MG/ML) 5 ML SYRINGE
INTRAMUSCULAR | Status: AC
  Filled 2016-12-04: qty 30

## 2016-12-04 MED ORDER — DEXTROSE 5 % IV SOLN
INTRAVENOUS | Status: AC
  Filled 2016-12-04: qty 1.5

## 2016-12-04 SURGICAL SUPPLY — 37 items
ARMBAND PINK RESTRICT EXTREMIT (MISCELLANEOUS) ×6 IMPLANT
CANISTER SUCT 3000ML PPV (MISCELLANEOUS) ×3 IMPLANT
CANNULA VESSEL 3MM 2 BLNT TIP (CANNULA) ×3 IMPLANT
CLIP TI MEDIUM 6 (CLIP) ×3 IMPLANT
CLIP TI WIDE RED SMALL 6 (CLIP) ×6 IMPLANT
DECANTER SPIKE VIAL GLASS SM (MISCELLANEOUS) ×3 IMPLANT
DERMABOND ADVANCED (GAUZE/BANDAGES/DRESSINGS) ×2
DERMABOND ADVANCED .7 DNX12 (GAUZE/BANDAGES/DRESSINGS) ×1 IMPLANT
ELECT REM PT RETURN 9FT ADLT (ELECTROSURGICAL) ×3
ELECTRODE REM PT RTRN 9FT ADLT (ELECTROSURGICAL) ×1 IMPLANT
GLOVE BIO SURGEON STRL SZ 6.5 (GLOVE) ×8 IMPLANT
GLOVE BIO SURGEON STRL SZ7.5 (GLOVE) ×3 IMPLANT
GLOVE BIO SURGEONS STRL SZ 6.5 (GLOVE) ×4
GLOVE BIOGEL PI IND STRL 6.5 (GLOVE) ×4 IMPLANT
GLOVE BIOGEL PI IND STRL 7.0 (GLOVE) ×1 IMPLANT
GLOVE BIOGEL PI IND STRL 8 (GLOVE) ×1 IMPLANT
GLOVE BIOGEL PI INDICATOR 6.5 (GLOVE) ×8
GLOVE BIOGEL PI INDICATOR 7.0 (GLOVE) ×2
GLOVE BIOGEL PI INDICATOR 8 (GLOVE) ×2
GLOVE ECLIPSE 6.5 STRL STRAW (GLOVE) ×6 IMPLANT
GLOVE ECLIPSE 7.5 STRL STRAW (GLOVE) ×3 IMPLANT
GOWN STRL REUS W/ TWL LRG LVL3 (GOWN DISPOSABLE) ×3 IMPLANT
GOWN STRL REUS W/ TWL XL LVL3 (GOWN DISPOSABLE) ×1 IMPLANT
GOWN STRL REUS W/TWL LRG LVL3 (GOWN DISPOSABLE) ×6
GOWN STRL REUS W/TWL XL LVL3 (GOWN DISPOSABLE) ×2
KIT BASIN OR (CUSTOM PROCEDURE TRAY) ×3 IMPLANT
KIT ROOM TURNOVER OR (KITS) ×3 IMPLANT
NS IRRIG 1000ML POUR BTL (IV SOLUTION) ×3 IMPLANT
PACK CV ACCESS (CUSTOM PROCEDURE TRAY) ×3 IMPLANT
PAD ARMBOARD 7.5X6 YLW CONV (MISCELLANEOUS) ×6 IMPLANT
SPONGE SURGIFOAM ABS GEL 100 (HEMOSTASIS) IMPLANT
SUT PROLENE 6 0 BV (SUTURE) ×12 IMPLANT
SUT VIC AB 3-0 SH 27 (SUTURE) ×4
SUT VIC AB 3-0 SH 27X BRD (SUTURE) ×2 IMPLANT
SUT VICRYL 4-0 PS2 18IN ABS (SUTURE) ×6 IMPLANT
UNDERPAD 30X30 (UNDERPADS AND DIAPERS) ×3 IMPLANT
WATER STERILE IRR 1000ML POUR (IV SOLUTION) ×3 IMPLANT

## 2016-12-04 NOTE — Telephone Encounter (Signed)
Sched lab 12/28/16 at 1:30 and MD 12/30/16 at 2:00. Hm# has no vm, lm on wife's # to inform pt of appt, 12/04/16 mar

## 2016-12-04 NOTE — Telephone Encounter (Signed)
-----   Message from Sharee PimpleMarilyn K McChesney, RN sent at 12/04/2016 12:55 PM EST ----- Regarding: 2-3 weeks w/ ABIs to discuss thigh graft   ----- Message ----- From: Raymond GurneyKimberly A Trinh, PA-C Sent: 12/04/2016  12:46 PM To: [facility] Charge Pool  S/p attempted left arm graft placement 12/04/16  F/u with [facility] (MD only) in next 2-3 weeks with ABIs to discuss possible thigh graft.   Thanks Selena BattenKim

## 2016-12-04 NOTE — Op Note (Signed)
    NAME: Shane Hamilton   MRN: [id number] DOB: 12/21/1965    DATE OF OPERATION: 12/04/2016  PREOP DIAGNOSIS: End-stage renal disease  POSTOP DIAGNOSIS: Same  PROCEDURE: Exploration of brachial artery and brachial veins  SURGEON: Di Kindlehristopher S. Edilia Boickson, MD, FACS  ASSIST: Karsten RoKim Trinh, [facility]  ANESTHESIA: Gen.   EBL: Minimal  INDICATIONS: Shane Hamilton is a 51 y.o. male who has a known central venous occlusion. On his last central venogram the left central veins were patent. He is morbidly obese and is felt to be high-risk breath I graft. Prior to placing a thigh graft thought it was worth attempting a graft in the left arm. He has had previous access in the left arm.  FINDINGS: The high brachial veins were occluded chronically. There were multiple large collaterals but no vein that was adequate for outflow. Therefore I graft could not be placed.  TECHNIQUE: The patient was taken to the operating room and received a general anesthetic. The left arm was prepped and draped in usual sterile fashion. Incision was made above the antecubital level and through this incision the brachial artery was dissected free. It was somewhat small. Therefore felt that an upper arm loop would be the best option. A separate longitudinal incision was made near the axilla. This was a fairly high incision in the axilla. I was able to find the brachial artery which was good size and had excellent pulse. However the adjacent brachial vein was occluded. I ligated it distally and opened it dissecting up as high as I possibly could on the vein at the vein was chronically occluded. There were multiple venous collaterals but no vein that was adequate size for outflow. After thorough exploration and felt there was no options for graft in the left arm. The axillary incision was closed with deep 3-0 Vicryl and skin closed with 4-0 Vicryl. The antecubital incision was closed with a deeper 3-0 Vicryl and the skin closed with 4-0  Vicryl.  CLINICAL NOTE: The patient will be evaluated in the office with ABIs and and could potentially have a left thigh AV graft. He does have a palpable left femoral pulse, but I cannot palpate pedal pulses.  Waverly Ferrarihristopher Everett Ricciardelli, MD, FACS [facility]  DATE OF DICTATION:   12/04/2016

## 2016-12-04 NOTE — Transfer of Care (Signed)
Immediate Anesthesia Transfer of Care Note  Patient: Shane Hamilton  Procedure(s) Performed: Procedure(s): EXPLORATION LEFT UPPER ARM (Left)  Patient Location: PACU  Anesthesia Type:General  Level of Consciousness: awake, alert , oriented and patient cooperative  Airway & Oxygen Therapy: Patient Spontanous Breathing and Patient connected to nasal cannula oxygen  Post-op Assessment: Report given to RN and Post -op Vital signs reviewed and stable  Post vital signs: Reviewed and stable  Last Vitals:  Vitals:   12/04/16 0857  BP: 135/83  Pulse: (!) 107  Resp: 20  Temp: 36.8 C    Last Pain:  Vitals:   12/04/16 0857  TempSrc: Oral      Patients Stated Pain Goal: 3 (12/04/16 0948)  Complications: No apparent anesthesia complications

## 2016-12-04 NOTE — H&P (View-Only) (Signed)
Patient name: Shane Hamilton MRN: [id number] DOB: Mar 12, 1966 Sex: male  REASON FOR VISIT: Evaluate for hemodialysis access. Referred by Dr. Camille Bal.  HPI: Shane Hamilton is a 51 y.o. male who presents for evaluation for new access. He currently has a right femoral tunneled dialysis catheter. He did have this exchanged and angioplasty of a fibrin sheath on 05/15/2016. He has not had access in the right arm but has unknown right central venous occlusion. He had a fistula on the left which appears to be a brachiocephalic fistula which ultimately could not be salvaged.  He denies any recent uremic symptoms. Specifically he denies nausea, vomiting, fatigue, anorexia, or palpitations.  Past Medical History:  Diagnosis Date  . Abscess of right arm 06/17/2015  . Anemia   . Anxiety   . Arteriovenous fistula infection (HCC)   . Cardiomegaly   . Chronic anticoagulation   . Chronic pancreatitis (HCC)   . DVT (deep venous thrombosis) (HCC) 2011  . ESRD (end stage renal disease) (HCC)    [facility]  . GERD (gastroesophageal reflux disease)   . Gout   . Headache    migraines  . Hypertension   . Kidney stones   . LOC (loss of consciousness) (HCC)   . Morbid obesity (HCC)   . OSA (obstructive sleep apnea)    not using cpap  . Pneumonia   . Secondary hyperparathyroidism (HCC)   . Seizures (HCC)    pt denies  . Septic shock(785.52)   . Shortness of breath dyspnea    with exertion  . Type 2 diabetes mellitus, uncontrolled (HCC)         Family History  Problem Relation Age of Onset  . Diabetes Mother   . Hypertension Mother   . Diabetes Father   . Hypertension Father   . Hypertension Brother     SOCIAL HISTORY: Social History  Substance Use Topics  . Smoking status: Never Smoker  . Smokeless tobacco: Never Used  . Alcohol use No    Allergies  Allergen Reactions  . Aspirin Other (See Comments)    Reaction:  GI upset     Current Outpatient  Prescriptions  Medication Sig Dispense Refill  . allopurinol (ZYLOPRIM) 300 MG tablet Take 300 mg by mouth 2 (two) times daily.    Marland Kitchen atorvastatin (LIPITOR) 10 MG tablet Take 10 mg by mouth daily.    . BD PEN NEEDLE NANO U/F 32G X 4 MM MISC     . calcium acetate (PHOSLO) 667 MG capsule Take 2,001 mg by mouth 3 (three) times daily with meals.     . carvedilol (COREG) 3.125 MG tablet Take 1 tablet (3.125 mg total) by mouth 2 (two) times daily with a meal. 60 tablet 0  . cinacalcet (SENSIPAR) 60 MG tablet Take 60 mg by mouth daily.    . clonazePAM (KLONOPIN) 1 MG tablet Take 0.5 tablets (0.5 mg total) by mouth 2 (two) times daily as needed for anxiety. 30 tablet 0  . colchicine 0.6 MG tablet Take 0.6 mg by mouth 3 (three) times daily.     Marland Kitchen gabapentin (NEURONTIN) 300 MG capsule Take 1 tablet daily and extra 1 tablet after dialysis.    Marland Kitchen ibuprofen (ADVIL,MOTRIN) 600 MG tablet TK 1 T PO TID FOR PAIN. DO NOT EXCEED 5 TABLETS IN 24 HOURS  0  . insulin glargine (LANTUS) 100 UNIT/ML injection Inject 0.35 mLs (35 Units total) into the skin at bedtime. (Patient taking differently:  Inject 35 Units into the skin at bedtime. Pt takes 70 units at bedtime) 10 mL 11  . lanthanum (FOSRENOL) 1000 MG chewable tablet Chew 1,000 mg by mouth 3 (three) times daily with meals.     Marland Kitchen LEVEMIR FLEXTOUCH 100 UNIT/ML Pen INJECT 70 UNITS UNDER THE SKIN QHS  6  . lidocaine (XYLOCAINE) 5 % ointment Apply to feet twice daily as needed.  Use with gloves/cotton swab 50 g 3  . multivitamin (RENA-VIT) TABS tablet Take 1 tablet by mouth daily.   3  . nortriptyline (PAMELOR) 10 MG capsule Take 1 capsule (10 mg total) by mouth at bedtime. 90 capsule 3  . NOVOLOG FLEXPEN 100 UNIT/ML FlexPen Inject 35 Units as directed 3 (three) times daily with meals.    Marland Kitchen omeprazole (PRILOSEC) 20 MG capsule Take 20 mg by mouth 2 (two) times daily before a meal.     . polyethylene glycol (MIRALAX / GLYCOLAX) packet Take 17 g by mouth daily as needed for  mild constipation. 14 each 0  . traMADol (ULTRAM) 50 MG tablet TK 1 TO 2 TS PO Q 6 H PRN P  0  . warfarin (COUMADIN) 10 MG tablet Take 10 mg by mouth daily.     No current facility-administered medications for this visit.     REVIEW OF SYSTEMS:  [X]  denotes positive finding, [ ]  denotes negative finding Cardiac  Comments:  Chest pain or chest pressure:    Shortness of breath upon exertion: X   Short of breath when lying flat:    Irregular heart rhythm:        Vascular    Pain in calf, thigh, or hip brought on by ambulation: X   Pain in feet at night that wakes you up from your sleep:     Blood clot in your veins:    Leg swelling:         Pulmonary    Oxygen at home:    Productive cough:     Wheezing:         Neurologic    Sudden weakness in arms or legs:     Sudden numbness in arms or legs:     Sudden onset of difficulty speaking or slurred speech:    Temporary loss of vision in one eye:     Problems with dizziness:         Gastrointestinal    Blood in stool:     Vomited blood:         Genitourinary    Burning when urinating:     Blood in urine:        Psychiatric    Major depression:         Hematologic    Bleeding problems:    Problems with blood clotting too easily:        Skin    Rashes or ulcers:        Constitutional    Fever or chills:      PHYSICAL EXAM: Vitals:   11/25/16 0921 11/25/16 0923  BP: (!) 150/110 (!) 151/97  Pulse: (!) 103   Resp: 20   Temp: 97.2 F (36.2 C)   TempSrc: Oral   SpO2: 96%   Weight: (!) 411 lb 9.6 oz (186.7 kg)   Height: 5\' 11"  (1.803 m)     GENERAL: The patient is a Morbidly obese male, in no acute distress. The vital signs are documented above. CARDIAC: There is a regular rate and rhythm.  VASCULAR: He has a palpable radial pulse on the left. I cannot palpate a radial pulse on the right. PULMONARY: There is good air exchange bilaterally without wheezing or rales. ABDOMEN: Soft and non-tender with normal  pitched bowel sounds.  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 NEUROLOGIC: No focal weakness or paresthesias are detected. SKIN: There are no ulcers or rashes noted. PSYCHIATRIC: The patient has a normal affect.  DATA:   VENOGRAM: This patient had a bilateral central venogram on 09/13/2015. At that time the right innominate vein and subclavian veins were occluded. The left subclavian and innominate veins were patent.  Upper extremity duplex: In 2016, he had triphasic signals in the radial and ulnar positions bilaterally.  MEDICAL ISSUES:  STAGE V CHRONIC KIDNEY DISEASE: This patient is not a candidate for access in the right arm given the right central venous occlusion. He is a poor candidate for a thigh graft given his obesity. It looks like he could potentially have a graft in his left arm. I have scheduled this for 12/04/16. We have discussed the procedure and potential complications and he is agreeable to proceed. We will hold his Coumadin 4 days prior to surgery.  Shane Hamilton, Shane Hamilton [facility] [phone]

## 2016-12-04 NOTE — Anesthesia Procedure Notes (Signed)
Procedure Name: Intubation Date/Time: 12/04/2016 11:10 AM Performed by: Shirlyn Goltz Pre-anesthesia Checklist: Patient identified Patient Re-evaluated:Patient Re-evaluated prior to inductionOxygen Delivery Method: Circle system utilized Preoxygenation: Pre-oxygenation with 100% oxygen Intubation Type: IV induction Ventilation: Mask ventilation without difficulty Laryngoscope Size: Mac and 4 Grade View: Grade I Tube type: Oral Tube size: 7.5 mm Number of attempts: 1 Airway Equipment and Method: Stylet Placement Confirmation: ETT inserted through vocal cords under direct vision,  positive ETCO2 and breath sounds checked- equal and bilateral Secured at: 22 cm Tube secured with: Tape Dental Injury: Teeth and Oropharynx as per pre-operative assessment

## 2016-12-04 NOTE — Interval H&P Note (Signed)
History and Physical Interval Note:  12/04/2016 10:13 AM  Shane Hamilton  has presented today for surgery, with the diagnosis of End stage renal disease N18.6  The various methods of treatment have been discussed with the patient and family. After consideration of risks, benefits and other options for treatment, the patient has consented to  Procedure(s): INSERTION OF ARTERIOVENOUS (AV) GORE-TEX GRAFT ARM (Left) as a surgical intervention .  The patient's history has been reviewed, patient examined, no change in status, stable for surgery.  I have reviewed the patient's chart and labs.  Questions were answered to the patient's satisfaction.     Waverly Ferrariickson, Christopher

## 2016-12-04 NOTE — Anesthesia Postprocedure Evaluation (Signed)
Anesthesia Post Note  Patient: Shane MinaReginald D Hamilton  Procedure(s) Performed: Procedure(s) (LRB): EXPLORATION LEFT UPPER ARM (Left)  Patient location during evaluation: PACU Anesthesia Type: General Level of consciousness: awake and alert, patient cooperative and oriented Pain management: pain level controlled Vital Signs Assessment: post-procedure vital signs reviewed and stable Respiratory status: spontaneous breathing, nonlabored ventilation and respiratory function stable Cardiovascular status: blood pressure returned to baseline and stable Postop Assessment: no signs of nausea or vomiting Anesthetic complications: no       Last Vitals:  Vitals:   12/04/16 1424 12/04/16 1435  BP:  114/72  Pulse:  99  Resp:  16  Temp: 36.4 C     Last Pain:  Vitals:   12/04/16 1435  TempSrc:   PainSc: 0-No pain                 Tami Barren,E. Curley Hogen

## 2016-12-04 NOTE — Progress Notes (Signed)
Unable to obtain iv access x2, Regulatory affairs officerHeather CRNA accessed HDcath and drew labs.

## 2016-12-04 NOTE — Anesthesia Preprocedure Evaluation (Addendum)
Anesthesia Evaluation  Patient identified by MRN, date of birth, ID band Patient awake    Reviewed: Allergy & Precautions, NPO status , Patient's Chart, lab work & pertinent test results  History of Anesthesia Complications Negative for: history of anesthetic complications  Airway Mallampati: I  TM Distance: >3 FB Neck ROM: Full    Dental  (+) Dental Advisory Given   Pulmonary shortness of breath, sleep apnea ,    breath sounds clear to auscultation       Cardiovascular hypertension (off meds since on dialysis), (-) angina+ DVT   Rhythm:Regular Rate:Normal     Neuro/Psych  Headaches,    GI/Hepatic Neg liver ROS, GERD  Medicated and Controlled,  Endo/Other  diabetes (glu 116), Insulin DependentMorbid obesity  Renal/GU ESRF and DialysisRenal disease     Musculoskeletal   Abdominal (+) + obese,   Peds  Hematology  (+) Blood dyscrasia (Coumadin: INR 1.66), ,   Anesthesia Other Findings   Reproductive/Obstetrics                            Anesthesia Physical Anesthesia Plan  ASA: III  Anesthesia Plan: General   Post-op Pain Management:    Induction: Intravenous  Airway Management Planned: Oral ETT  Additional Equipment:   Intra-op Plan:   Post-operative Plan:   Informed Consent: I have reviewed the patients History and Physical, chart, labs and discussed the procedure including the risks, benefits and alternatives for the proposed anesthesia with the patient or authorized representative who has indicated his/her understanding and acceptance.   Dental advisory given  Plan Discussed with: Surgeon and CRNA  Anesthesia Plan Comments: (Plan routine monitors, GETA )        Anesthesia Quick Evaluation

## 2016-12-05 ENCOUNTER — Encounter (HOSPITAL_COMMUNITY): Payer: Self-pay | Admitting: Vascular Surgery

## 2016-12-05 DIAGNOSIS — N2581 Secondary hyperparathyroidism of renal origin: Secondary | ICD-10-CM | POA: Diagnosis not present

## 2016-12-05 DIAGNOSIS — N186 End stage renal disease: Secondary | ICD-10-CM | POA: Diagnosis not present

## 2016-12-05 DIAGNOSIS — E1129 Type 2 diabetes mellitus with other diabetic kidney complication: Secondary | ICD-10-CM | POA: Diagnosis not present

## 2016-12-05 DIAGNOSIS — D631 Anemia in chronic kidney disease: Secondary | ICD-10-CM | POA: Diagnosis not present

## 2016-12-05 DIAGNOSIS — D509 Iron deficiency anemia, unspecified: Secondary | ICD-10-CM | POA: Diagnosis not present

## 2016-12-08 DIAGNOSIS — D631 Anemia in chronic kidney disease: Secondary | ICD-10-CM | POA: Diagnosis not present

## 2016-12-08 DIAGNOSIS — N2581 Secondary hyperparathyroidism of renal origin: Secondary | ICD-10-CM | POA: Diagnosis not present

## 2016-12-08 DIAGNOSIS — E1129 Type 2 diabetes mellitus with other diabetic kidney complication: Secondary | ICD-10-CM | POA: Diagnosis not present

## 2016-12-08 DIAGNOSIS — D509 Iron deficiency anemia, unspecified: Secondary | ICD-10-CM | POA: Diagnosis not present

## 2016-12-08 DIAGNOSIS — N186 End stage renal disease: Secondary | ICD-10-CM | POA: Diagnosis not present

## 2016-12-10 DIAGNOSIS — N186 End stage renal disease: Secondary | ICD-10-CM | POA: Diagnosis not present

## 2016-12-10 DIAGNOSIS — N2581 Secondary hyperparathyroidism of renal origin: Secondary | ICD-10-CM | POA: Diagnosis not present

## 2016-12-10 DIAGNOSIS — E1129 Type 2 diabetes mellitus with other diabetic kidney complication: Secondary | ICD-10-CM | POA: Diagnosis not present

## 2016-12-10 DIAGNOSIS — D509 Iron deficiency anemia, unspecified: Secondary | ICD-10-CM | POA: Diagnosis not present

## 2016-12-10 DIAGNOSIS — I2782 Chronic pulmonary embolism: Secondary | ICD-10-CM | POA: Diagnosis not present

## 2016-12-10 DIAGNOSIS — D631 Anemia in chronic kidney disease: Secondary | ICD-10-CM | POA: Diagnosis not present

## 2016-12-12 DIAGNOSIS — D631 Anemia in chronic kidney disease: Secondary | ICD-10-CM | POA: Diagnosis not present

## 2016-12-12 DIAGNOSIS — D509 Iron deficiency anemia, unspecified: Secondary | ICD-10-CM | POA: Diagnosis not present

## 2016-12-12 DIAGNOSIS — N2581 Secondary hyperparathyroidism of renal origin: Secondary | ICD-10-CM | POA: Diagnosis not present

## 2016-12-12 DIAGNOSIS — E1129 Type 2 diabetes mellitus with other diabetic kidney complication: Secondary | ICD-10-CM | POA: Diagnosis not present

## 2016-12-12 DIAGNOSIS — N186 End stage renal disease: Secondary | ICD-10-CM | POA: Diagnosis not present

## 2016-12-15 DIAGNOSIS — N186 End stage renal disease: Secondary | ICD-10-CM | POA: Diagnosis not present

## 2016-12-15 DIAGNOSIS — D509 Iron deficiency anemia, unspecified: Secondary | ICD-10-CM | POA: Diagnosis not present

## 2016-12-15 DIAGNOSIS — E1129 Type 2 diabetes mellitus with other diabetic kidney complication: Secondary | ICD-10-CM | POA: Diagnosis not present

## 2016-12-15 DIAGNOSIS — D631 Anemia in chronic kidney disease: Secondary | ICD-10-CM | POA: Diagnosis not present

## 2016-12-15 DIAGNOSIS — N2581 Secondary hyperparathyroidism of renal origin: Secondary | ICD-10-CM | POA: Diagnosis not present

## 2016-12-16 ENCOUNTER — Ambulatory Visit: Payer: Medicare Other | Admitting: Nurse Practitioner

## 2016-12-16 ENCOUNTER — Telehealth: Payer: Self-pay | Admitting: Nurse Practitioner

## 2016-12-17 ENCOUNTER — Encounter: Payer: Self-pay | Admitting: Vascular Surgery

## 2016-12-17 DIAGNOSIS — D631 Anemia in chronic kidney disease: Secondary | ICD-10-CM | POA: Diagnosis not present

## 2016-12-17 DIAGNOSIS — I2782 Chronic pulmonary embolism: Secondary | ICD-10-CM | POA: Diagnosis not present

## 2016-12-17 DIAGNOSIS — N186 End stage renal disease: Secondary | ICD-10-CM | POA: Diagnosis not present

## 2016-12-17 DIAGNOSIS — N2581 Secondary hyperparathyroidism of renal origin: Secondary | ICD-10-CM | POA: Diagnosis not present

## 2016-12-17 DIAGNOSIS — E1129 Type 2 diabetes mellitus with other diabetic kidney complication: Secondary | ICD-10-CM | POA: Diagnosis not present

## 2016-12-17 DIAGNOSIS — D509 Iron deficiency anemia, unspecified: Secondary | ICD-10-CM | POA: Diagnosis not present

## 2016-12-19 DIAGNOSIS — D509 Iron deficiency anemia, unspecified: Secondary | ICD-10-CM | POA: Diagnosis not present

## 2016-12-19 DIAGNOSIS — N186 End stage renal disease: Secondary | ICD-10-CM | POA: Diagnosis not present

## 2016-12-19 DIAGNOSIS — N2581 Secondary hyperparathyroidism of renal origin: Secondary | ICD-10-CM | POA: Diagnosis not present

## 2016-12-19 DIAGNOSIS — E1129 Type 2 diabetes mellitus with other diabetic kidney complication: Secondary | ICD-10-CM | POA: Diagnosis not present

## 2016-12-19 DIAGNOSIS — D631 Anemia in chronic kidney disease: Secondary | ICD-10-CM | POA: Diagnosis not present

## 2016-12-22 DIAGNOSIS — E1129 Type 2 diabetes mellitus with other diabetic kidney complication: Secondary | ICD-10-CM | POA: Diagnosis not present

## 2016-12-22 DIAGNOSIS — D631 Anemia in chronic kidney disease: Secondary | ICD-10-CM | POA: Diagnosis not present

## 2016-12-22 DIAGNOSIS — N186 End stage renal disease: Secondary | ICD-10-CM | POA: Diagnosis not present

## 2016-12-22 DIAGNOSIS — N2581 Secondary hyperparathyroidism of renal origin: Secondary | ICD-10-CM | POA: Diagnosis not present

## 2016-12-22 DIAGNOSIS — D509 Iron deficiency anemia, unspecified: Secondary | ICD-10-CM | POA: Diagnosis not present

## 2016-12-23 ENCOUNTER — Other Ambulatory Visit: Payer: Self-pay

## 2016-12-23 ENCOUNTER — Ambulatory Visit: Payer: Medicare Other | Admitting: Nurse Practitioner

## 2016-12-24 DIAGNOSIS — E1129 Type 2 diabetes mellitus with other diabetic kidney complication: Secondary | ICD-10-CM | POA: Diagnosis not present

## 2016-12-24 DIAGNOSIS — N2581 Secondary hyperparathyroidism of renal origin: Secondary | ICD-10-CM | POA: Diagnosis not present

## 2016-12-24 DIAGNOSIS — I2782 Chronic pulmonary embolism: Secondary | ICD-10-CM | POA: Diagnosis not present

## 2016-12-24 DIAGNOSIS — D631 Anemia in chronic kidney disease: Secondary | ICD-10-CM | POA: Diagnosis not present

## 2016-12-24 DIAGNOSIS — D509 Iron deficiency anemia, unspecified: Secondary | ICD-10-CM | POA: Diagnosis not present

## 2016-12-24 DIAGNOSIS — N186 End stage renal disease: Secondary | ICD-10-CM | POA: Diagnosis not present

## 2016-12-26 DIAGNOSIS — E1129 Type 2 diabetes mellitus with other diabetic kidney complication: Secondary | ICD-10-CM | POA: Diagnosis not present

## 2016-12-26 DIAGNOSIS — D509 Iron deficiency anemia, unspecified: Secondary | ICD-10-CM | POA: Diagnosis not present

## 2016-12-26 DIAGNOSIS — N186 End stage renal disease: Secondary | ICD-10-CM | POA: Diagnosis not present

## 2016-12-26 DIAGNOSIS — D631 Anemia in chronic kidney disease: Secondary | ICD-10-CM | POA: Diagnosis not present

## 2016-12-26 DIAGNOSIS — N2581 Secondary hyperparathyroidism of renal origin: Secondary | ICD-10-CM | POA: Diagnosis not present

## 2016-12-28 ENCOUNTER — Ambulatory Visit (HOSPITAL_COMMUNITY)
Admission: RE | Admit: 2016-12-28 | Discharge: 2016-12-28 | Disposition: A | Discharge status: Home / Self Care | Payer: Medicare Other | Source: Ambulatory Visit | Attending: Surgery | Admitting: Surgery

## 2016-12-28 DIAGNOSIS — E1122 Type 2 diabetes mellitus with diabetic chronic kidney disease: Secondary | ICD-10-CM | POA: Insufficient documentation

## 2016-12-28 DIAGNOSIS — Z01810 Encounter for preprocedural cardiovascular examination: Secondary | ICD-10-CM | POA: Diagnosis not present

## 2016-12-28 DIAGNOSIS — N186 End stage renal disease: Secondary | ICD-10-CM | POA: Diagnosis not present

## 2016-12-28 DIAGNOSIS — R0989 Other specified symptoms and signs involving the circulatory and respiratory systems: Secondary | ICD-10-CM | POA: Diagnosis present

## 2016-12-29 DIAGNOSIS — N2581 Secondary hyperparathyroidism of renal origin: Secondary | ICD-10-CM | POA: Diagnosis not present

## 2016-12-29 DIAGNOSIS — N186 End stage renal disease: Secondary | ICD-10-CM | POA: Diagnosis not present

## 2016-12-29 DIAGNOSIS — D509 Iron deficiency anemia, unspecified: Secondary | ICD-10-CM | POA: Diagnosis not present

## 2016-12-29 DIAGNOSIS — E1129 Type 2 diabetes mellitus with other diabetic kidney complication: Secondary | ICD-10-CM | POA: Diagnosis not present

## 2016-12-29 DIAGNOSIS — D631 Anemia in chronic kidney disease: Secondary | ICD-10-CM | POA: Diagnosis not present

## 2016-12-30 ENCOUNTER — Ambulatory Visit (INDEPENDENT_AMBULATORY_CARE_PROVIDER_SITE_OTHER): Payer: Self-pay | Admitting: Vascular Surgery

## 2016-12-30 ENCOUNTER — Other Ambulatory Visit: Payer: Self-pay

## 2016-12-30 ENCOUNTER — Encounter: Payer: Self-pay | Admitting: Vascular Surgery

## 2016-12-30 VITALS — BP 118/72 | HR 106 | Temp 97.6°F | Resp 21 | Ht 71.0 in | Wt >= 6400 oz

## 2016-12-30 DIAGNOSIS — Z992 Dependence on renal dialysis: Secondary | ICD-10-CM

## 2016-12-30 DIAGNOSIS — N186 End stage renal disease: Secondary | ICD-10-CM

## 2016-12-30 NOTE — Progress Notes (Signed)
 Patient name: Shane Hamilton MRN: [id number] DOB: 11/03/1965 Sex: male  REASON FOR VISIT: Evaluate for hemodialysis access.  HPI: Shane Hamilton is a 50 y.o. male who I saw in consultation on 11/25/2016 to evaluate for new access. He had a right femoral tunneled dialysis catheter. This was exchanged because of a fibrin sheath on 05/15/2016. He has not had access in the right arm but has a known right central venous occlusion. 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 He underwent a central venogram on 09/13/2015 which showed that the left subclavian and innominate veins were patent.  He was taken to the operating room on 3-18 and I explored the brachial veins. These were chronically occluded. Therefore he could not have access placed in the left arm. I set him up for ABIs and to come in for an office visit to discuss possibly having a thigh graft.   He dialyzes on Tuesdays Thursdays and Saturdays at [facility]. He uses a right femoral tunneled dialysis catheter.  He has been on Coumadin for a long time because of a previous history of DVT.  Past Medical History:  Diagnosis Date  . Abscess of right arm 06/17/2015  . Anemia   . Anxiety   . Arteriovenous fistula infection (HCC)   . Cardiomegaly   . Chronic anticoagulation   . Chronic pancreatitis (HCC)   . DVT (deep venous thrombosis) (HCC) 2011  . ESRD (end stage renal disease) (HCC)    [facility]  . GERD (gastroesophageal reflux disease)   . Gout   . Headache    migraines  . History of kidney stones   . Hypertension   . Kidney stones   . LOC (loss of consciousness) (HCC)   . Morbid obesity (HCC)   . OSA (obstructive sleep apnea)    not using cpap, uses O2   . Pneumonia   . Secondary hyperparathyroidism (HCC)   . Seizures (HCC)    pt denies  . Septic shock(785.52)   . Shortness of breath dyspnea    with exertion  . Type 2 diabetes mellitus,  uncontrolled (HCC)    Type 2    Family History  Problem Relation Age of Onset  . Diabetes Mother   . Hypertension Mother   . Diabetes Father   . Hypertension Father   . Hypertension Brother     SOCIAL HISTORY: Social History  Substance Use Topics  . Smoking status: Never Smoker  . Smokeless tobacco: Never Used  . Alcohol use No    Allergies  Allergen Reactions  . Aspirin Other (See Comments)    Reaction:  GI upset     Current Outpatient Prescriptions  Medication Sig Dispense Refill  . allopurinol (ZYLOPRIM) 300 MG tablet Take 300 mg by mouth 2 (two) times daily.    . BD PEN NEEDLE NANO U/F 32G X 4 MM MISC     . calcium acetate (PHOSLO) 667 MG capsule Take 2,001-2,668 mg by mouth 3 (three) times daily with meals. Also with snacks    . cinacalcet (SENSIPAR) 60 MG tablet Take 60 mg by mouth 3 (three) times a week. Tuesdays, Thursdays, and Saturdays    . clonazePAM (KLONOPIN) 1 MG tablet Take 1 tablet (1 mg total) by mouth 2 (two) times daily as needed for anxiety.    . colchicine 0.6 MG tablet Take 0.6 mg by mouth 2 (two) times daily.     .   gabapentin (NEURONTIN) 300 MG capsule Take 1 capsule (300 mg total) by mouth 3 (three) times daily as needed (pain).    . ibuprofen (ADVIL,MOTRIN) 800 MG tablet Take 800 mg by mouth every 8 (eight) hours as needed for moderate pain.    . LEVEMIR FLEXTOUCH 100 UNIT/ML Pen INJECT 70 UNITS UNDER THE SKIN QHS  6  . lidocaine (XYLOCAINE) 5 % ointment Apply to feet twice daily as needed.  Use with gloves/cotton swab 50 g 3  . multivitamin (RENA-VIT) TABS tablet Take 1 tablet by mouth daily.   3  . nortriptyline (PAMELOR) 10 MG capsule Take 1 capsule (10 mg total) by mouth at bedtime. 90 capsule 3  . NOVOLOG FLEXPEN 100 UNIT/ML FlexPen Inject 35 Units as directed 3 (three) times daily with meals.    . omeprazole (PRILOSEC) 20 MG capsule Take 20 mg by mouth 2 (two) times daily before a meal.     . Tetrahydrozoline HCl (VISINE OP) Apply 1 drop to  eye daily as needed (dry eyes).    . traMADol (ULTRAM) 50 MG tablet Take 1 tablet (50 mg total) by mouth every 6 (six) hours as needed for moderate pain. 20 tablet 0  . warfarin (COUMADIN) 7.5 MG tablet Take 1 tablet (7.5 mg total) by mouth daily.     No current facility-administered medications for this visit.     REVIEW OF SYSTEMS:  [X] denotes positive finding, [ ] denotes negative finding Cardiac  Comments:  Chest pain or chest pressure:    Shortness of breath upon exertion:    Short of breath when lying flat:    Irregular heart rhythm:        Vascular    Pain in calf, thigh, or hip brought on by ambulation:    Pain in feet at night that wakes you up from your sleep:     Blood clot in your veins:    Leg swelling:         Pulmonary    Oxygen at home:    Productive cough:     Wheezing:         Neurologic    Sudden weakness in arms or legs:     Sudden numbness in arms or legs:     Sudden onset of difficulty speaking or slurred speech:    Temporary loss of vision in one eye:     Problems with dizziness:         Gastrointestinal    Blood in stool:     Vomited blood:         Genitourinary    Burning when urinating:     Blood in urine:        Psychiatric    Major depression:         Hematologic    Bleeding problems:    Problems with blood clotting too easily:        Skin    Rashes or ulcers:        Constitutional    Fever or chills:      PHYSICAL EXAM: Vitals:   12/30/16 1407  BP: 118/72  Pulse: (!) 106  Resp: (!) 21  Temp: 97.6 F (36.4 C)  TempSrc: Oral  SpO2: 95%  Weight: (!) 411 lb 9.6 oz (186.7 kg)  Height: 5' 11" (1.803 m)    GENERAL: The patient is a well-nourished male, in no acute distress. The vital signs are documented above. CARDIAC: There is a regular rate and rhythm.    VASCULAR: I'm unable to palpate his left femoral pulse because of his obesity. PULMONARY: There is good air exchange bilaterally without wheezing or rales. ABDOMEN:  Soft and non-tender with normal pitched bowel sounds.  MUSCULOSKELETAL: There are no major deformities or cyanosis. NEUROLOGIC: No focal weakness or paresthesias are detected. SKIN: There are no ulcers or rashes noted. PSYCHIATRIC: The patient has a normal affect.  DATA:   Bilateral lower extremity Doppler study: I have reviewed his bilateral lower extremity Doppler study which was done on 12/28/2016. This shows triphasic Doppler signals in the dorsalis pedis and posterior tibial positions bilaterally.  MEDICAL ISSUES:  END-STAGE RENAL DISEASE: He is not an ideal candidate for thigh graft given his obesity. However given that he currently has a temporal tunneled dialysis catheter which is at high-risk for infection I think it is reasonable to attempt a left thigh AV graft. He had normal ABIs and therefore is at lower risk for steal problems. We'll stop his Coumadin 5 days preoperatively. Given the significantly increased risk for wound complications on Coumadin and given his obesity, I would like to hold his Coumadin at least for a week postoperatively or possibly indefinitely. His surgery has been scheduled for a nondialysis day (01/15/2017). We will keep him overnight for observation. He will try to get to his outpatient center the following morning.    Denishia Citro [facility] [place] [facility] [phone]   

## 2016-12-31 DIAGNOSIS — Z7901 Long term (current) use of anticoagulants: Secondary | ICD-10-CM | POA: Diagnosis not present

## 2016-12-31 DIAGNOSIS — I82599 Chronic embolism and thrombosis of other specified deep vein of unspecified lower extremity: Secondary | ICD-10-CM | POA: Diagnosis not present

## 2016-12-31 DIAGNOSIS — N186 End stage renal disease: Secondary | ICD-10-CM | POA: Diagnosis not present

## 2016-12-31 DIAGNOSIS — D631 Anemia in chronic kidney disease: Secondary | ICD-10-CM | POA: Diagnosis not present

## 2016-12-31 DIAGNOSIS — D509 Iron deficiency anemia, unspecified: Secondary | ICD-10-CM | POA: Diagnosis not present

## 2016-12-31 DIAGNOSIS — E1129 Type 2 diabetes mellitus with other diabetic kidney complication: Secondary | ICD-10-CM | POA: Diagnosis not present

## 2016-12-31 DIAGNOSIS — Z5181 Encounter for therapeutic drug level monitoring: Secondary | ICD-10-CM | POA: Diagnosis not present

## 2016-12-31 DIAGNOSIS — N2581 Secondary hyperparathyroidism of renal origin: Secondary | ICD-10-CM | POA: Diagnosis not present

## 2017-01-02 DIAGNOSIS — N186 End stage renal disease: Secondary | ICD-10-CM | POA: Diagnosis not present

## 2017-01-02 DIAGNOSIS — N2581 Secondary hyperparathyroidism of renal origin: Secondary | ICD-10-CM | POA: Diagnosis not present

## 2017-01-02 DIAGNOSIS — D631 Anemia in chronic kidney disease: Secondary | ICD-10-CM | POA: Diagnosis not present

## 2017-01-02 DIAGNOSIS — E1129 Type 2 diabetes mellitus with other diabetic kidney complication: Secondary | ICD-10-CM | POA: Diagnosis not present

## 2017-01-02 DIAGNOSIS — D509 Iron deficiency anemia, unspecified: Secondary | ICD-10-CM | POA: Diagnosis not present

## 2017-01-02 DIAGNOSIS — Z992 Dependence on renal dialysis: Secondary | ICD-10-CM | POA: Diagnosis not present

## 2017-01-05 DIAGNOSIS — E1129 Type 2 diabetes mellitus with other diabetic kidney complication: Secondary | ICD-10-CM | POA: Diagnosis not present

## 2017-01-05 DIAGNOSIS — T8249XD Other complication of vascular dialysis catheter, subsequent encounter: Secondary | ICD-10-CM | POA: Diagnosis not present

## 2017-01-05 DIAGNOSIS — D509 Iron deficiency anemia, unspecified: Secondary | ICD-10-CM | POA: Diagnosis not present

## 2017-01-05 DIAGNOSIS — N2581 Secondary hyperparathyroidism of renal origin: Secondary | ICD-10-CM | POA: Diagnosis not present

## 2017-01-05 DIAGNOSIS — N186 End stage renal disease: Secondary | ICD-10-CM | POA: Diagnosis not present

## 2017-01-05 DIAGNOSIS — D631 Anemia in chronic kidney disease: Secondary | ICD-10-CM | POA: Diagnosis not present

## 2017-01-07 DIAGNOSIS — D631 Anemia in chronic kidney disease: Secondary | ICD-10-CM | POA: Diagnosis not present

## 2017-01-07 DIAGNOSIS — I2782 Chronic pulmonary embolism: Secondary | ICD-10-CM | POA: Diagnosis not present

## 2017-01-07 DIAGNOSIS — D509 Iron deficiency anemia, unspecified: Secondary | ICD-10-CM | POA: Diagnosis not present

## 2017-01-07 DIAGNOSIS — N186 End stage renal disease: Secondary | ICD-10-CM | POA: Diagnosis not present

## 2017-01-07 DIAGNOSIS — N2581 Secondary hyperparathyroidism of renal origin: Secondary | ICD-10-CM | POA: Diagnosis not present

## 2017-01-07 DIAGNOSIS — T8249XD Other complication of vascular dialysis catheter, subsequent encounter: Secondary | ICD-10-CM | POA: Diagnosis not present

## 2017-01-07 DIAGNOSIS — E1129 Type 2 diabetes mellitus with other diabetic kidney complication: Secondary | ICD-10-CM | POA: Diagnosis not present

## 2017-01-09 DIAGNOSIS — T8249XD Other complication of vascular dialysis catheter, subsequent encounter: Secondary | ICD-10-CM | POA: Diagnosis not present

## 2017-01-09 DIAGNOSIS — D631 Anemia in chronic kidney disease: Secondary | ICD-10-CM | POA: Diagnosis not present

## 2017-01-09 DIAGNOSIS — E1129 Type 2 diabetes mellitus with other diabetic kidney complication: Secondary | ICD-10-CM | POA: Diagnosis not present

## 2017-01-09 DIAGNOSIS — D509 Iron deficiency anemia, unspecified: Secondary | ICD-10-CM | POA: Diagnosis not present

## 2017-01-09 DIAGNOSIS — N2581 Secondary hyperparathyroidism of renal origin: Secondary | ICD-10-CM | POA: Diagnosis not present

## 2017-01-09 DIAGNOSIS — N186 End stage renal disease: Secondary | ICD-10-CM | POA: Diagnosis not present

## 2017-01-12 ENCOUNTER — Encounter (HOSPITAL_COMMUNITY): Payer: Self-pay | Admitting: *Deleted

## 2017-01-12 DIAGNOSIS — N186 End stage renal disease: Secondary | ICD-10-CM | POA: Diagnosis not present

## 2017-01-12 DIAGNOSIS — T8249XD Other complication of vascular dialysis catheter, subsequent encounter: Secondary | ICD-10-CM | POA: Diagnosis not present

## 2017-01-12 DIAGNOSIS — E1129 Type 2 diabetes mellitus with other diabetic kidney complication: Secondary | ICD-10-CM | POA: Diagnosis not present

## 2017-01-12 DIAGNOSIS — D509 Iron deficiency anemia, unspecified: Secondary | ICD-10-CM | POA: Diagnosis not present

## 2017-01-12 DIAGNOSIS — D631 Anemia in chronic kidney disease: Secondary | ICD-10-CM | POA: Diagnosis not present

## 2017-01-12 DIAGNOSIS — N2581 Secondary hyperparathyroidism of renal origin: Secondary | ICD-10-CM | POA: Diagnosis not present

## 2017-01-12 NOTE — Progress Notes (Signed)
Pt denies SOB, chest pain, and being under the care of a cardiologist. Pt denies having a stress test, and cardiac cath. Pt made aware to stop taking vitamins, fish oil, and herbal medications. Do not take any NSAIDs ie: Ibuprofen, Advil, Naproxen BC and Goody Powder. Pt stated that last dose of Coumadin was 01/08/17. Pt stated that MD advised that he takes only a half dose of Levemir  Insulin the night before surgery. Pt made aware of diabetes protocol to check BG every 2 hours piror to arrival to hospital, treat BG< 70 with 4 glucose tabs or glucose gel, or 4 ounces of apple of cranberry juice, wait 15 minutes after intervention and recheck BG, if BG remains< 70 call the SS unit. Pt verbalized understanding of all pre-op instructions.

## 2017-01-14 DIAGNOSIS — N2581 Secondary hyperparathyroidism of renal origin: Secondary | ICD-10-CM | POA: Diagnosis not present

## 2017-01-14 DIAGNOSIS — E1129 Type 2 diabetes mellitus with other diabetic kidney complication: Secondary | ICD-10-CM | POA: Diagnosis not present

## 2017-01-14 DIAGNOSIS — D509 Iron deficiency anemia, unspecified: Secondary | ICD-10-CM | POA: Diagnosis not present

## 2017-01-14 DIAGNOSIS — D631 Anemia in chronic kidney disease: Secondary | ICD-10-CM | POA: Diagnosis not present

## 2017-01-14 DIAGNOSIS — T8249XD Other complication of vascular dialysis catheter, subsequent encounter: Secondary | ICD-10-CM | POA: Diagnosis not present

## 2017-01-14 DIAGNOSIS — N186 End stage renal disease: Secondary | ICD-10-CM | POA: Diagnosis not present

## 2017-01-14 DIAGNOSIS — I2782 Chronic pulmonary embolism: Secondary | ICD-10-CM | POA: Diagnosis not present

## 2017-01-14 MED ORDER — SODIUM CHLORIDE 0.9 % IV SOLN
INTRAVENOUS | Status: DC
  Administered 2017-01-15: 10:00:00 via INTRAVENOUS

## 2017-01-14 MED ORDER — DEXTROSE 5 % IV SOLN
1.5000 g | INTRAVENOUS | Status: AC
  Administered 2017-01-15: 1.5 g via INTRAVENOUS
  Filled 2017-01-14: qty 1.5

## 2017-01-15 ENCOUNTER — Ambulatory Visit (HOSPITAL_COMMUNITY): Payer: Medicare Other | Admitting: Anesthesiology

## 2017-01-15 ENCOUNTER — Encounter (HOSPITAL_COMMUNITY): Payer: Self-pay | Admitting: *Deleted

## 2017-01-15 ENCOUNTER — Encounter (HOSPITAL_COMMUNITY)
Admission: RE | Disposition: A | Discharge status: Home / Self Care | Payer: Self-pay | Source: Ambulatory Visit | Attending: Vascular Surgery

## 2017-01-15 ENCOUNTER — Telehealth: Payer: Self-pay | Admitting: Vascular Surgery

## 2017-01-15 ENCOUNTER — Observation Stay (HOSPITAL_COMMUNITY)
Admission: RE | Admit: 2017-01-15 | Discharge: 2017-01-16 | Disposition: A | Discharge status: Home / Self Care | Payer: Medicare Other | Source: Ambulatory Visit | Attending: Vascular Surgery | Admitting: Vascular Surgery

## 2017-01-15 DIAGNOSIS — G4733 Obstructive sleep apnea (adult) (pediatric): Secondary | ICD-10-CM | POA: Insufficient documentation

## 2017-01-15 DIAGNOSIS — Z794 Long term (current) use of insulin: Secondary | ICD-10-CM | POA: Diagnosis not present

## 2017-01-15 DIAGNOSIS — Z7901 Long term (current) use of anticoagulants: Secondary | ICD-10-CM | POA: Insufficient documentation

## 2017-01-15 DIAGNOSIS — N2581 Secondary hyperparathyroidism of renal origin: Secondary | ICD-10-CM | POA: Diagnosis not present

## 2017-01-15 DIAGNOSIS — Z6841 Body Mass Index (BMI) 40.0 and over, adult: Secondary | ICD-10-CM | POA: Diagnosis not present

## 2017-01-15 DIAGNOSIS — I1311 Hypertensive heart and chronic kidney disease without heart failure, with stage 5 chronic kidney disease, or end stage renal disease: Secondary | ICD-10-CM | POA: Insufficient documentation

## 2017-01-15 DIAGNOSIS — M109 Gout, unspecified: Secondary | ICD-10-CM | POA: Insufficient documentation

## 2017-01-15 DIAGNOSIS — I70219 Atherosclerosis of native arteries of extremities with intermittent claudication, unspecified extremity: Secondary | ICD-10-CM | POA: Diagnosis not present

## 2017-01-15 DIAGNOSIS — K8681 Exocrine pancreatic insufficiency: Secondary | ICD-10-CM | POA: Insufficient documentation

## 2017-01-15 DIAGNOSIS — F419 Anxiety disorder, unspecified: Secondary | ICD-10-CM | POA: Diagnosis not present

## 2017-01-15 DIAGNOSIS — N186 End stage renal disease: Secondary | ICD-10-CM | POA: Insufficient documentation

## 2017-01-15 DIAGNOSIS — Z79899 Other long term (current) drug therapy: Secondary | ICD-10-CM | POA: Diagnosis not present

## 2017-01-15 DIAGNOSIS — I120 Hypertensive chronic kidney disease with stage 5 chronic kidney disease or end stage renal disease: Secondary | ICD-10-CM | POA: Diagnosis not present

## 2017-01-15 DIAGNOSIS — E1151 Type 2 diabetes mellitus with diabetic peripheral angiopathy without gangrene: Secondary | ICD-10-CM | POA: Diagnosis not present

## 2017-01-15 DIAGNOSIS — Z86718 Personal history of other venous thrombosis and embolism: Secondary | ICD-10-CM | POA: Insufficient documentation

## 2017-01-15 DIAGNOSIS — Z992 Dependence on renal dialysis: Secondary | ICD-10-CM | POA: Diagnosis not present

## 2017-01-15 DIAGNOSIS — Z87442 Personal history of urinary calculi: Secondary | ICD-10-CM | POA: Insufficient documentation

## 2017-01-15 DIAGNOSIS — Z791 Long term (current) use of non-steroidal anti-inflammatories (NSAID): Secondary | ICD-10-CM | POA: Diagnosis not present

## 2017-01-15 DIAGNOSIS — K861 Other chronic pancreatitis: Secondary | ICD-10-CM | POA: Insufficient documentation

## 2017-01-15 DIAGNOSIS — E1142 Type 2 diabetes mellitus with diabetic polyneuropathy: Secondary | ICD-10-CM | POA: Diagnosis not present

## 2017-01-15 DIAGNOSIS — D631 Anemia in chronic kidney disease: Secondary | ICD-10-CM | POA: Diagnosis present

## 2017-01-15 DIAGNOSIS — Z886 Allergy status to analgesic agent status: Secondary | ICD-10-CM | POA: Diagnosis not present

## 2017-01-15 DIAGNOSIS — E1122 Type 2 diabetes mellitus with diabetic chronic kidney disease: Secondary | ICD-10-CM | POA: Diagnosis not present

## 2017-01-15 DIAGNOSIS — R569 Unspecified convulsions: Secondary | ICD-10-CM | POA: Diagnosis not present

## 2017-01-15 DIAGNOSIS — K219 Gastro-esophageal reflux disease without esophagitis: Secondary | ICD-10-CM | POA: Insufficient documentation

## 2017-01-15 DIAGNOSIS — I472 Ventricular tachycardia: Secondary | ICD-10-CM | POA: Diagnosis not present

## 2017-01-15 HISTORY — PX: AV FISTULA PLACEMENT: SHX1204

## 2017-01-15 HISTORY — PX: APPLICATION OF WOUND VAC: SHX5189

## 2017-01-15 LAB — APTT: aPTT: 35 seconds (ref 24–36)

## 2017-01-15 LAB — PROTIME-INR
INR: 1.08
Prothrombin Time: 14.1 seconds (ref 11.4–15.2)

## 2017-01-15 LAB — POCT I-STAT 4, (NA,K, GLUC, HGB,HCT)
GLUCOSE: 152 mg/dL — AB (ref 65–99)
HEMATOCRIT: 37 % — AB (ref 39.0–52.0)
Hemoglobin: 12.6 g/dL — ABNORMAL LOW (ref 13.0–17.0)
Potassium: 3.9 mmol/L (ref 3.5–5.1)
Sodium: 135 mmol/L (ref 135–145)

## 2017-01-15 LAB — GLUCOSE, CAPILLARY
GLUCOSE-CAPILLARY: 126 mg/dL — AB (ref 65–99)
GLUCOSE-CAPILLARY: 207 mg/dL — AB (ref 65–99)
GLUCOSE-CAPILLARY: 270 mg/dL — AB (ref 65–99)
GLUCOSE-CAPILLARY: 209 mg/dL — AB (ref 65–99)

## 2017-01-15 SURGERY — INSERTION OF ARTERIOVENOUS (AV) GORE-TEX GRAFT THIGH
Anesthesia: General | Laterality: Left

## 2017-01-15 MED ORDER — DEXAMETHASONE SODIUM PHOSPHATE 10 MG/ML IJ SOLN
INTRAMUSCULAR | Status: AC
  Filled 2017-01-15: qty 1

## 2017-01-15 MED ORDER — SODIUM CHLORIDE 0.9 % IV SOLN
INTRAVENOUS | Status: DC | PRN
  Administered 2017-01-15: 11:00:00

## 2017-01-15 MED ORDER — DEXAMETHASONE SODIUM PHOSPHATE 10 MG/ML IJ SOLN
INTRAMUSCULAR | Status: DC | PRN
  Administered 2017-01-15: 10 mg via INTRAVENOUS

## 2017-01-15 MED ORDER — LABETALOL HCL 5 MG/ML IV SOLN: 10.0000 mg | INTRAVENOUS | Status: DC | PRN

## 2017-01-15 MED ORDER — FENTANYL CITRATE (PF) 100 MCG/2ML IJ SOLN
25.0000 ug | INTRAMUSCULAR | Status: DC | PRN
  Administered 2017-01-15 (×2): 25 ug via INTRAVENOUS
  Administered 2017-01-15 (×2): 50 ug via INTRAVENOUS

## 2017-01-15 MED ORDER — CINACALCET HCL 30 MG PO TABS
60.0000 mg | ORAL_TABLET | ORAL | Status: DC
  Administered 2017-01-15: 60 mg via ORAL
  Filled 2017-01-15: qty 2

## 2017-01-15 MED ORDER — MIDAZOLAM HCL 2 MG/2ML IJ SOLN
INTRAMUSCULAR | Status: AC
  Filled 2017-01-15: qty 2

## 2017-01-15 MED ORDER — PROPOFOL 10 MG/ML IV BOLUS
INTRAVENOUS | Status: DC | PRN
  Administered 2017-01-15: 150 mg via INTRAVENOUS

## 2017-01-15 MED ORDER — CHLORHEXIDINE GLUCONATE CLOTH 2 % EX PADS: 6.0000 | MEDICATED_PAD | Freq: Once | CUTANEOUS | Status: DC

## 2017-01-15 MED ORDER — FENTANYL CITRATE (PF) 100 MCG/2ML IJ SOLN
INTRAMUSCULAR | Status: AC
  Filled 2017-01-15: qty 2

## 2017-01-15 MED ORDER — ALBUMIN HUMAN 5 % IV SOLN
INTRAVENOUS | Status: DC | PRN
  Administered 2017-01-15: 11:00:00 via INTRAVENOUS

## 2017-01-15 MED ORDER — NORTRIPTYLINE HCL 10 MG PO CAPS
10.0000 mg | ORAL_CAPSULE | Freq: Every day | ORAL | Status: DC
  Administered 2017-01-15: 10 mg via ORAL
  Filled 2017-01-15: qty 1

## 2017-01-15 MED ORDER — PHENYLEPHRINE HCL 10 MG/ML IJ SOLN
INTRAVENOUS | Status: DC | PRN
  Administered 2017-01-15: 75 ug/min via INTRAVENOUS

## 2017-01-15 MED ORDER — POTASSIUM CHLORIDE CRYS ER 20 MEQ PO TBCR: 20.0000 meq | EXTENDED_RELEASE_TABLET | Freq: Once | ORAL | Status: DC

## 2017-01-15 MED ORDER — CLONAZEPAM 1 MG PO TABS
1.0000 mg | ORAL_TABLET | Freq: Two times a day (BID) | ORAL | Status: DC | PRN
  Administered 2017-01-15: 1 mg via ORAL
  Filled 2017-01-15: qty 1

## 2017-01-15 MED ORDER — PHENYLEPHRINE 40 MCG/ML (10ML) SYRINGE FOR IV PUSH (FOR BLOOD PRESSURE SUPPORT)
PREFILLED_SYRINGE | INTRAVENOUS | Status: AC
  Filled 2017-01-15: qty 10

## 2017-01-15 MED ORDER — MIDAZOLAM HCL 5 MG/5ML IJ SOLN
INTRAMUSCULAR | Status: DC | PRN
  Administered 2017-01-15: 2 mg via INTRAVENOUS

## 2017-01-15 MED ORDER — MORPHINE SULFATE (PF) 2 MG/ML IV SOLN: 2.0000 mg | INTRAVENOUS | Status: DC | PRN

## 2017-01-15 MED ORDER — POLYETHYLENE GLYCOL 3350 17 G PO PACK: 17.0000 g | PACK | Freq: Every day | ORAL | Status: DC | PRN

## 2017-01-15 MED ORDER — ACETAMINOPHEN 650 MG RE SUPP: 325.0000 mg | RECTAL | Status: DC | PRN

## 2017-01-15 MED ORDER — INSULIN ASPART 100 UNIT/ML ~~LOC~~ SOLN: 0.0000 [IU] | Freq: Three times a day (TID) | SUBCUTANEOUS | Status: DC

## 2017-01-15 MED ORDER — PHENYLEPHRINE HCL 10 MG/ML IJ SOLN
INTRAMUSCULAR | Status: DC | PRN
  Administered 2017-01-15 (×3): 80 ug via INTRAVENOUS

## 2017-01-15 MED ORDER — ALLOPURINOL 300 MG PO TABS: 600.0000 mg | ORAL_TABLET | Freq: Every day | ORAL | Status: DC

## 2017-01-15 MED ORDER — LIDOCAINE 2% (20 MG/ML) 5 ML SYRINGE
INTRAMUSCULAR | Status: AC
  Filled 2017-01-15: qty 5

## 2017-01-15 MED ORDER — GABAPENTIN 300 MG PO CAPS: 300.0000 mg | ORAL_CAPSULE | Freq: Three times a day (TID) | ORAL | Status: DC | PRN

## 2017-01-15 MED ORDER — HEPARIN SODIUM (PORCINE) 1000 UNIT/ML IJ SOLN
INTRAMUSCULAR | Status: AC
  Filled 2017-01-15: qty 1

## 2017-01-15 MED ORDER — HYDRALAZINE HCL 20 MG/ML IJ SOLN: 5.0000 mg | INTRAMUSCULAR | Status: DC | PRN

## 2017-01-15 MED ORDER — THROMBIN 20000 UNITS EX SOLR
CUTANEOUS | Status: AC
  Filled 2017-01-15: qty 20000

## 2017-01-15 MED ORDER — FENTANYL CITRATE (PF) 100 MCG/2ML IJ SOLN
INTRAMUSCULAR | Status: DC | PRN
  Administered 2017-01-15 (×3): 50 ug via INTRAVENOUS

## 2017-01-15 MED ORDER — INSULIN ASPART 100 UNIT/ML ~~LOC~~ SOLN
0.0000 [IU] | Freq: Every day | SUBCUTANEOUS | Status: DC
  Administered 2017-01-15: 2 [IU] via SUBCUTANEOUS

## 2017-01-15 MED ORDER — IBUPROFEN 400 MG PO TABS
800.0000 mg | ORAL_TABLET | Freq: Three times a day (TID) | ORAL | Status: DC | PRN
  Administered 2017-01-15: 800 mg via ORAL
  Filled 2017-01-15: qty 2

## 2017-01-15 MED ORDER — SUCCINYLCHOLINE CHLORIDE 20 MG/ML IJ SOLN
INTRAMUSCULAR | Status: DC | PRN
  Administered 2017-01-15: 120 mg via INTRAVENOUS

## 2017-01-15 MED ORDER — BISACODYL 10 MG RE SUPP: 10.0000 mg | Freq: Every day | RECTAL | Status: DC | PRN

## 2017-01-15 MED ORDER — METOPROLOL TARTRATE 5 MG/5ML IV SOLN: 2.0000 mg | INTRAVENOUS | Status: DC | PRN

## 2017-01-15 MED ORDER — COLCHICINE 0.6 MG PO TABS
1.2000 mg | ORAL_TABLET | Freq: Every day | ORAL | Status: DC
Start: 2017-01-16 — End: 2017-01-16

## 2017-01-15 MED ORDER — PROTAMINE SULFATE 10 MG/ML IV SOLN
INTRAVENOUS | Status: DC | PRN
  Administered 2017-01-15 (×3): 10 mg via INTRAVENOUS
  Administered 2017-01-15: 20 mg via INTRAVENOUS
  Administered 2017-01-15: 10 mg via INTRAVENOUS

## 2017-01-15 MED ORDER — PANTOPRAZOLE SODIUM 40 MG PO TBEC
40.0000 mg | DELAYED_RELEASE_TABLET | Freq: Every day | ORAL | Status: DC
  Administered 2017-01-15: 40 mg via ORAL
  Filled 2017-01-15 (×2): qty 1

## 2017-01-15 MED ORDER — PROTAMINE SULFATE 10 MG/ML IV SOLN
INTRAVENOUS | Status: AC
  Filled 2017-01-15: qty 5

## 2017-01-15 MED ORDER — CALCIUM ACETATE (PHOS BINDER) 667 MG PO CAPS
2001.0000 mg | ORAL_CAPSULE | Freq: Three times a day (TID) | ORAL | Status: DC
  Administered 2017-01-15: 2001 mg via ORAL
  Filled 2017-01-15: qty 3

## 2017-01-15 MED ORDER — INSULIN DETEMIR 100 UNIT/ML ~~LOC~~ SOLN
70.0000 [IU] | Freq: Every day | SUBCUTANEOUS | Status: DC
Start: 2017-01-15 — End: 2017-01-16
  Administered 2017-01-15: 70 [IU] via SUBCUTANEOUS
  Filled 2017-01-15: qty 0.7

## 2017-01-15 MED ORDER — RENA-VITE PO TABS
1.0000 | ORAL_TABLET | Freq: Every day | ORAL | Status: DC
  Administered 2017-01-15: 1 via ORAL
  Filled 2017-01-15: qty 1

## 2017-01-15 MED ORDER — ONDANSETRON HCL 4 MG/2ML IJ SOLN
INTRAMUSCULAR | Status: DC | PRN
  Administered 2017-01-15 (×2): 4 mg via INTRAVENOUS

## 2017-01-15 MED ORDER — SUGAMMADEX SODIUM 200 MG/2ML IV SOLN
INTRAVENOUS | Status: AC
  Filled 2017-01-15: qty 2

## 2017-01-15 MED ORDER — LIDOCAINE HCL (CARDIAC) 20 MG/ML IV SOLN
INTRAVENOUS | Status: DC | PRN
  Administered 2017-01-15: 60 mg via INTRAVENOUS

## 2017-01-15 MED ORDER — ACETAMINOPHEN 325 MG PO TABS: 325.0000 mg | ORAL_TABLET | ORAL | Status: DC | PRN

## 2017-01-15 MED ORDER — PHENOL 1.4 % MT LIQD
1.0000 | OROMUCOSAL | Status: DC | PRN
Start: 2017-01-15 — End: 2017-01-16

## 2017-01-15 MED ORDER — INSULIN ASPART 100 UNIT/ML ~~LOC~~ SOLN
0.0000 [IU] | Freq: Three times a day (TID) | SUBCUTANEOUS | Status: DC
  Administered 2017-01-15: 5 [IU] via SUBCUTANEOUS

## 2017-01-15 MED ORDER — GUAIFENESIN-DM 100-10 MG/5ML PO SYRP: 15.0000 mL | ORAL_SOLUTION | ORAL | Status: DC | PRN

## 2017-01-15 MED ORDER — ROCURONIUM BROMIDE 50 MG/5ML IV SOSY
PREFILLED_SYRINGE | INTRAVENOUS | Status: AC
  Filled 2017-01-15: qty 5

## 2017-01-15 MED ORDER — ONDANSETRON HCL 4 MG/2ML IJ SOLN
INTRAMUSCULAR | Status: AC
  Filled 2017-01-15: qty 2

## 2017-01-15 MED ORDER — FENTANYL CITRATE (PF) 250 MCG/5ML IJ SOLN
INTRAMUSCULAR | Status: AC
  Filled 2017-01-15: qty 5

## 2017-01-15 MED ORDER — DEXTROSE 5 % IV SOLN
1.5000 g | Freq: Once | INTRAVENOUS | Status: DC
  Filled 2017-01-15: qty 1.5

## 2017-01-15 MED ORDER — ARTIFICIAL TEARS OP OINT
TOPICAL_OINTMENT | OPHTHALMIC | Status: AC
  Filled 2017-01-15: qty 3.5

## 2017-01-15 MED ORDER — VECURONIUM BROMIDE 10 MG IV SOLR
INTRAVENOUS | Status: AC
  Filled 2017-01-15: qty 10

## 2017-01-15 MED ORDER — SODIUM CHLORIDE 0.9 % IV SOLN
INTRAVENOUS | Status: DC
  Administered 2017-01-15 (×2): via INTRAVENOUS

## 2017-01-15 MED ORDER — SUGAMMADEX SODIUM 200 MG/2ML IV SOLN
INTRAVENOUS | Status: DC | PRN
  Administered 2017-01-15: 200 mg via INTRAVENOUS

## 2017-01-15 MED ORDER — TRAMADOL HCL 50 MG PO TABS: 50.0000 mg | ORAL_TABLET | Freq: Four times a day (QID) | ORAL | 0 refills | Status: AC | PRN

## 2017-01-15 MED ORDER — ONDANSETRON HCL 4 MG/2ML IJ SOLN: 4.0000 mg | Freq: Four times a day (QID) | INTRAMUSCULAR | Status: DC | PRN

## 2017-01-15 MED ORDER — TRAMADOL HCL 50 MG PO TABS: 50.0000 mg | ORAL_TABLET | Freq: Four times a day (QID) | ORAL | Status: DC | PRN

## 2017-01-15 MED ORDER — ROCURONIUM BROMIDE 100 MG/10ML IV SOLN
INTRAVENOUS | Status: DC | PRN
  Administered 2017-01-15: 50 mg via INTRAVENOUS

## 2017-01-15 MED ORDER — SUCCINYLCHOLINE CHLORIDE 200 MG/10ML IV SOSY
PREFILLED_SYRINGE | INTRAVENOUS | Status: AC
  Filled 2017-01-15: qty 10

## 2017-01-15 MED ORDER — FENTANYL CITRATE (PF) 100 MCG/2ML IJ SOLN
INTRAMUSCULAR | Status: AC
  Administered 2017-01-15: 25 ug via INTRAVENOUS
  Filled 2017-01-15: qty 2

## 2017-01-15 MED ORDER — HEPARIN SODIUM (PORCINE) 1000 UNIT/ML IJ SOLN
INTRAMUSCULAR | Status: DC | PRN
  Administered 2017-01-15: 12000 [IU] via INTRAVENOUS

## 2017-01-15 MED ORDER — 0.9 % SODIUM CHLORIDE (POUR BTL) OPTIME
TOPICAL | Status: DC | PRN
  Administered 2017-01-15: 1000 mL

## 2017-01-15 MED ORDER — ARTIFICIAL TEARS OP OINT
TOPICAL_OINTMENT | OPHTHALMIC | Status: DC | PRN
  Administered 2017-01-15: 1 via OPHTHALMIC

## 2017-01-15 SURGICAL SUPPLY — 37 items
BLADE 11 SAFETY STRL DISP (BLADE) ×2 IMPLANT
CANISTER SUCT 3000ML PPV (MISCELLANEOUS) ×2 IMPLANT
CANNULA VESSEL 3MM 2 BLNT TIP (CANNULA) ×2 IMPLANT
CLIP TI MEDIUM 6 (CLIP) ×2 IMPLANT
CLIP TI WIDE RED SMALL 24 (CLIP) ×2 IMPLANT
CLIP TI WIDE RED SMALL 6 (CLIP) ×2 IMPLANT
DERMABOND ADVANCED (GAUZE/BANDAGES/DRESSINGS) ×1
DERMABOND ADVANCED .7 DNX12 (GAUZE/BANDAGES/DRESSINGS) ×1 IMPLANT
DRAPE INCISE IOBAN 66X45 STRL (DRAPES) ×4 IMPLANT
DRAPE ORTHO SPLIT 77X108 STRL (DRAPES) ×1
DRAPE SURG ORHT 6 SPLT 77X108 (DRAPES) ×1 IMPLANT
ELECT REM PT RETURN 9FT ADLT (ELECTROSURGICAL) ×2
ELECTRODE REM PT RTRN 9FT ADLT (ELECTROSURGICAL) ×1 IMPLANT
GLOVE BIO SURGEON STRL SZ 6.5 (GLOVE) ×2 IMPLANT
GLOVE BIO SURGEON STRL SZ7.5 (GLOVE) ×2 IMPLANT
GLOVE BIOGEL PI IND STRL 8 (GLOVE) ×1 IMPLANT
GLOVE BIOGEL PI INDICATOR 8 (GLOVE) ×1
GOWN STRL REUS W/ TWL LRG LVL3 (GOWN DISPOSABLE) ×3 IMPLANT
GOWN STRL REUS W/TWL LRG LVL3 (GOWN DISPOSABLE) ×3
GRAFT GORETEX STRT 4-7X45 (Vascular Products) ×2 IMPLANT
HOVERMATT SINGLE USE (MISCELLANEOUS) ×2 IMPLANT
KIT BASIN OR (CUSTOM PROCEDURE TRAY) ×2 IMPLANT
KIT PREVENA INCISION MGT 13 (CANNISTER) ×2 IMPLANT
KIT ROOM TURNOVER OR (KITS) ×2 IMPLANT
LOOP VESSEL MAXI BLUE (MISCELLANEOUS) ×2 IMPLANT
NS IRRIG 1000ML POUR BTL (IV SOLUTION) ×2 IMPLANT
PACK CV ACCESS (CUSTOM PROCEDURE TRAY) ×2 IMPLANT
PAD ARMBOARD 7.5X6 YLW CONV (MISCELLANEOUS) ×4 IMPLANT
SPONGE SURGIFOAM ABS GEL 100 (HEMOSTASIS) IMPLANT
SUT PROLENE 6 0 BV (SUTURE) ×8 IMPLANT
SUT VIC AB 2-0 CTB1 (SUTURE) ×2 IMPLANT
SUT VIC AB 3-0 SH 27 (SUTURE) ×2
SUT VIC AB 3-0 SH 27X BRD (SUTURE) ×2 IMPLANT
SUT VICRYL 4-0 PS2 18IN ABS (SUTURE) ×4 IMPLANT
UNDERPAD 30X30 (UNDERPADS AND DIAPERS) ×2 IMPLANT
WATER STERILE IRR 1000ML POUR (IV SOLUTION) ×2 IMPLANT
WND VAC CANISTER 500ML (MISCELLANEOUS) ×2 IMPLANT

## 2017-01-15 NOTE — Telephone Encounter (Signed)
Sched appt 01/27/17 at 2:00. Pt's # had no vm; lm on wife's # to inform pt of appt.

## 2017-01-15 NOTE — Anesthesia Procedure Notes (Signed)
Procedure Name: Intubation Date/Time: 01/15/2017 10:13 AM Performed by: Jacquiline Doe A Pre-anesthesia Checklist: Patient identified, Emergency Drugs available, Suction available and Patient being monitored Patient Re-evaluated:Patient Re-evaluated prior to inductionOxygen Delivery Method: Circle System Utilized and Circle system utilized Preoxygenation: Pre-oxygenation with 100% oxygen Intubation Type: IV induction and Cricoid Pressure applied Ventilation: Mask ventilation without difficulty Laryngoscope Size: Mac and 4 Grade View: Grade I Tube type: Oral Tube size: 7.5 mm Number of attempts: 1 Airway Equipment and Method: Stylet Placement Confirmation: ETT inserted through vocal cords under direct vision,  positive ETCO2 and breath sounds checked- equal and bilateral Secured at: 23 cm Tube secured with: Tape Dental Injury: Teeth and Oropharynx as per pre-operative assessment

## 2017-01-15 NOTE — Anesthesia Postprocedure Evaluation (Signed)
Anesthesia Post Note  Patient: SHERMAINE BRIGHAM  Procedure(s) Performed: Procedure(s) (LRB): INSERTION OF ARTERIOVENOUS (AV) GORE-TEX STRETCH GRAFT THIGH (Left)  Patient location during evaluation: PACU Anesthesia Type: General Level of consciousness: awake and alert Pain management: pain level controlled Vital Signs Assessment: post-procedure vital signs reviewed and stable Respiratory status: spontaneous breathing, nonlabored ventilation, respiratory function stable and patient connected to nasal cannula oxygen Cardiovascular status: blood pressure returned to baseline and stable Postop Assessment: no signs of nausea or vomiting Anesthetic complications: no       Last Vitals:  Vitals:   01/15/17 1350 01/15/17 1357  BP:  133/80  Pulse: (!) 101 (!) 102  Resp: 16 18  Temp:      Last Pain:  Vitals:   01/15/17 1255  TempSrc:   PainSc: 6                  Mahalia Dykes,W. EDMOND

## 2017-01-15 NOTE — Progress Notes (Addendum)
Will give home dose of coumadin this evening.  Will not need to have INR checked in the morning as he will be discharged at 5am to make his outpatient dialysis center treatment.  He will have INR checked on dialysis on Tuesday, 01/19/17.   Doreatha Massed, [facility] 01/15/2017 12:27 PM   ADDENDUM:  Discussed coumadin with Dr. Edilia Bo. He does not feel the pt needs to continue on coumadin given risks vs benefits and therefore will discontinue coumadin.  Doreatha Massed, [facility] 01/15/2017 12:35 PM

## 2017-01-15 NOTE — Interval H&P Note (Signed)
History and Physical Interval Note:  01/15/2017 9:33 AM  Shane Hamilton  has presented today for surgery, with the diagnosis of End stage renal disease N18.6  The various methods of treatment have been discussed with the patient and family. After consideration of risks, benefits and other options for treatment, the patient has consented to  Procedure(s): INSERTION OF ARTERIOVENOUS (AV) GORE-TEX GRAFT THIGH (Left) as a surgical intervention .  The patient's history has been reviewed, patient examined, no change in status, stable for surgery.  I have reviewed the patient's chart and labs.  Questions were answered to the patient's satisfaction.     Waverly Ferrari

## 2017-01-15 NOTE — Discharge Summary (Signed)
Discharge Summary    Shane Hamilton 06-27-1966 51 y.o. male  [id number]  Admission Date: 01/15/2017  Discharge Date: 01/16/17  Physician: Chuck Hint, MD  Admission Diagnosis: End stage renal disease N18.6   HPI:   This is a 51 y.o. male who I saw in consultation on 11/25/2016 to evaluate for new access. He had a right femoral tunneled dialysis catheter. This was exchanged because of a fibrin sheath on 05/15/2016. He has not had access in the right arm but has a known right central venous occlusion. 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 He underwent a central venogram on 09/13/2015 which showed that the left subclavian and innominate veins were patent.  He was taken to the operating room on 3-18 and I explored the brachial veins. These were chronically occluded. Therefore he could not have access placed in the left arm. I set him up for ABIs and to come in for an office visit to discuss possibly having a thigh graft.   He dialyzes on Tuesdays Thursdays and Saturdays at [facility]. He uses a right femoral tunneled dialysis catheter.  He has been on Coumadin for a long time because of a previous history of DVT.  Hospital Course:  The patient was admitted to the hospital and taken to the operating room on 01/15/2017 and underwent: New left thigh AV graft (4-7 mm PTFE graft)    The pt tolerated the procedure well and was transported to the PACU in good condition.   By POD 1, he was discharged early so that he could make his outpatient dialysis appointment.  He did have the Pravena wound vac in place.   The remainder of the hospital course consisted of increasing mobilization and increasing intake of solids without difficulty.  CBC    Component Value Date/Time   WBC 12.1 (H) 03/31/2016 1600   RBC 2.78 (L) 03/31/2016 1600   HGB 12.6 (L) 01/15/2017 0728   HGB 8.5 (L) 04/24/2014 1434   HCT 37.0 (L)  01/15/2017 0728   HCT 26.9 (L) 04/24/2014 1434   PLT 254 03/31/2016 1600   PLT 309 04/24/2014 1434   MCV 102.2 (H) 03/31/2016 1600   MCV 111 (H) 04/24/2014 1434   MCH 31.3 03/31/2016 1600   MCHC 30.6 03/31/2016 1600   RDW 16.6 (H) 03/31/2016 1600   RDW 19.9 (H) 04/24/2014 1434   LYMPHSABS 2.1 03/26/2016 1235   MONOABS 1.0 03/26/2016 1235   EOSABS 0.0 03/26/2016 1235   BASOSABS 0.0 03/26/2016 1235    BMET    Component Value Date/Time   NA 135 01/15/2017 0728   K 3.9 01/15/2017 0728   K 3.6 04/24/2014 1434   CL 99 (L) 05/15/2016 0846   CO2 23 05/15/2016 0846   GLUCOSE 152 (H) 01/15/2017 0728   BUN 64 (H) 05/15/2016 0846   CREATININE 11.50 (H) 05/15/2016 0846   CALCIUM 8.9 05/15/2016 0846   GFRNONAA 5 (L) 05/15/2016 0846   GFRAA 5 (L) 05/15/2016 0846      Discharge Instructions    Call MD for:  redness, tenderness, or signs of infection (pain, swelling, bleeding, redness, odor or green/yellow discharge around incision site)    Complete by:  As directed    Call MD for:  severe or increased pain, loss or decreased feeling  in affected limb(s)    Complete by:  As directed    Call MD for:  temperature >100.5  Complete by:  As directed    Change dressing (specify)    Complete by:  As directed    Keep Pravena wound vac on for 7 days and then you may remove it.  Once it is removed, follow these instructions: Wash the groin wound with soap and water daily and pat completely dry.  Then put a dry gauze or washcloth there to keep this area dry to wick the moisture daily and as needed to help prevent infection.  Do not use Vaseline or neosporin on your incisions.  Only use soap and water on your incisions and then protect and keep dry.  The only time this area should be wet is if you are washing with soap and water, otherwise, keep it dry.   Driving Restrictions    Complete by:  As directed    Do not drive for 24 hours and while taking pain medication.   Lifting restrictions     Complete by:  As directed    No heavy lifting for 4 weeks   Resume previous diet    Complete by:  As directed       Discharge Diagnosis:  End stage renal disease N18.6  Secondary Diagnosis: Patient Active Problem List   Diagnosis Date Noted  . ESRD (end stage renal disease) (HCC) 01/15/2017  . Diabetic polyneuropathy associated with diabetes mellitus due to underlying condition (HCC) 09/09/2016  . NSVT (nonsustained ventricular tachycardia) (HCC) 03/29/2016  . Bacteremia 03/27/2016  . Fever 03/26/2016  . Sepsis (HCC) 03/26/2016  . End-stage renal disease on hemodialysis (HCC)   . Gout 03/04/2016  . Atherosclerosis of native arteries of extremity with intermittent claudication (HCC) 10/11/2015  . AV fistula infection (HCC) 06/18/2015  . DVT (deep venous thrombosis) (HCC) 06/17/2015  . Chronic anticoagulation   . Type 2 diabetes mellitus, uncontrolled (HCC)   . Morbid obesity (HCC)   . Seizures (HCC)   . Obstructive sleep apnea 08/06/2011   Past Medical History:  Diagnosis Date  . Abscess of right arm 06/17/2015  . Anemia   . Anxiety   . Arteriovenous fistula infection (HCC)   . Cardiomegaly   . Chronic anticoagulation   . Chronic pancreatitis (HCC)   . DVT (deep venous thrombosis) (HCC) 2011  . ESRD (end stage renal disease) (HCC)    [facility] [place]  . GERD (gastroesophageal reflux disease)   . Gout   . Headache    migraines  . History of kidney stones   . Hypertension   . Kidney stones   . LOC (loss of consciousness) (HCC)   . Morbid obesity (HCC)   . OSA (obstructive sleep apnea)    wears CPAP  . Pneumonia   . Secondary hyperparathyroidism (HCC)   . Seizures (HCC)    pt denies  . Septic shock(785.52)   . Shortness of breath dyspnea    with exertion  . Type 2 diabetes mellitus, uncontrolled (HCC)    Type 2     Allergies as of 01/15/2017      Reactions   Aspirin Nausea And Vomiting, Other (See Comments)   Reaction:  GI upset       Medication  List    STOP taking these medications   warfarin 7.5 MG tablet Commonly known as:  COUMADIN     TAKE these medications   allopurinol 300 MG tablet Commonly known as:  ZYLOPRIM Take 600 mg by mouth daily.   BD PEN NEEDLE NANO U/F 32G X 4 MM Misc  Generic drug:  Insulin Pen Needle   cinacalcet 60 MG tablet Commonly known as:  SENSIPAR Take 60 mg by mouth 3 (three) times a week. Tuesdays, Thursdays, and Saturdays   clonazePAM 1 MG tablet Commonly known as:  KLONOPIN Take 1 tablet (1 mg total) by mouth 2 (two) times daily as needed for anxiety.   colchicine 0.6 MG tablet Take 1.2 mg by mouth daily.   gabapentin 300 MG capsule Commonly known as:  NEURONTIN Take 1 capsule (300 mg total) by mouth 3 (three) times daily as needed (pain).   ibuprofen 800 MG tablet Commonly known as:  ADVIL,MOTRIN Take 800 mg by mouth every 8 (eight) hours as needed for moderate pain.   LEVEMIR FLEXTOUCH 100 UNIT/ML Pen Generic drug:  Insulin Detemir INJECT 70 UNITS UNDER THE SKIN QHS   lidocaine 5 % ointment Commonly known as:  XYLOCAINE Apply to feet twice daily as needed.  Use with gloves/cotton swab   multivitamin Tabs tablet Take 1 tablet by mouth daily.   nortriptyline 10 MG capsule Commonly known as:  PAMELOR Take 1 capsule (10 mg total) by mouth at bedtime.   NOVOLOG FLEXPEN 100 UNIT/ML FlexPen Generic drug:  insulin aspart Inject 35 Units as directed 3 (three) times daily with meals.   omeprazole 20 MG capsule Commonly known as:  PRILOSEC Take 20 mg by mouth 2 (two) times daily before a meal.   PHOSLO 667 MG capsule Generic drug:  calcium acetate Take 2,001-2,668 mg by mouth 3 (three) times daily with meals. Also with snacks   traMADol 50 MG tablet Commonly known as:  ULTRAM Take 1 tablet (50 mg total) by mouth every 6 (six) hours as needed for moderate pain.   VISINE OP Apply 1 drop to eye daily as needed (dry eyes).       Prescriptions given: Tramadol#20 No  Refill  Instructions: 1.  No driving for 24 hours and while taking pain medication 2.  No heavy lifting x 4 weeks       3. Keep Pravena wound vac on for 7 days and then you may remove it. Once it is removed, follow these instructions: Wash the groin wound with soap and water daily and pat completely dry. Then put a dry gauze or washcloth there to keep this area dry to wick the moisture daily and as needed to help prevent infection. Do not use Vaseline or neosporin on your incisions. Only use soap and water on your incisions and then protect and keep dry. The only time this area should be wet is if you are washing with soap and water, otherwise, keep it dry.   Disposition: home  Patient's condition: is Good  Follow up: 1. Dr. Fredric Dine in 2 weeks   Doreatha Massed, PA-C [facility] [phone] 01/15/2017  3:57 PM

## 2017-01-15 NOTE — Progress Notes (Signed)
Patient states that he takes novolog and 70 units of levemir at home. MD notified. MD gave verbal order to check CBG ACHS and to place order for low-sensitivity insulin aspart (novolog). Also, verbal order to give 70 units of levemir at bedtime. Orders placed. Will continue to monitor.

## 2017-01-15 NOTE — Transfer of Care (Signed)
Immediate Anesthesia Transfer of Care Note  Patient: Shane Hamilton  Procedure(s) Performed: Procedure(s): INSERTION OF ARTERIOVENOUS (AV) GORE-TEX STRETCH GRAFT THIGH (Left)  Patient Location: PACU  Anesthesia Type:General  Level of Consciousness: awake, oriented, sedated, patient cooperative and responds to stimulation  Airway & Oxygen Therapy: Patient Spontanous Breathing and Patient connected to nasal cannula oxygen  Post-op Assessment: Report given to RN, Post -op Vital signs reviewed and stable, Patient moving all extremities and Patient moving all extremities X 4  Post vital signs: Reviewed and stable  Last Vitals:  Vitals:   01/15/17 0712 01/15/17 1226  BP: 113/69 128/79  Pulse: (!) 105 (!) 106  Resp: 18 13  Temp: 36.7 C 36.1 C    Last Pain:  Vitals:   01/15/17 0712  TempSrc: Oral      Patients Stated Pain Goal: 3 (01/15/17 0719)  Complications: No apparent anesthesia complications

## 2017-01-15 NOTE — Telephone Encounter (Signed)
-----   Message from Sharee Pimple, RN sent at 01/15/2017  1:05 PM EDT ----- Regarding: 1-3 weeks for PA    ----- Message ----- From: Dara Lords, PA-C Sent: 01/15/2017  12:05 PM To: Vvs Charge Pool  s/p left thigh graft placement 01/15/17.  f/u in PA clinic in 1-3 weeks for wound check.  Please make sure it is a day that Dr. Edilia Bo is in the office.  Thanks, Lelon Mast

## 2017-01-15 NOTE — Op Note (Signed)
    NAME: CODEY BURLING   MRN: [id number] DOB: [birth year]    DATE OF OPERATION: 01/15/2017  PREOP DIAGNOSIS: End-stage renal disease  POSTOP DIAGNOSIS: Same  PROCEDURE: New left thigh AV graft (4-7 mm PTFE graft)  SURGEON: Di Kindle. Edilia Bo, MD, FACS  ASSIST: Doreatha Massed, PA  ANESTHESIA: Gen.   EBL: Minimal  INDICATIONS: FINLAY GODBEE is a 51 y.o. male who presents for new access. He has a right femoral tunneled dialysis catheter. He is morbidly obese.   FINDINGS:  I made the incision below the inguinal crease because of my concerns for wound healing problems. I also placed a Prevena dressing on the left groin.  TECHNIQUE: The patient was taken to the operative room and received a general anesthetic. The left thigh was prepped and draped in usual sterile fashion. The pannus had been taped superiorly. A transverse incision was made below the inguinal crease. The dissection was carried down to the superficial femoral artery which had a good pulse. An appendix dissected the saphenous vein free over a length of several centimeters. Using one distal counterincision, a 4-7 mm PTFE graft was tunneled in a loop fashion in the thigh with the arterial aspect of the graft along the lateral aspect of the thigh. The patient was heparinized. The superficial femoral artery was clamped proximally and distally and a longitudinal arteriotomy was made. A segment of the 4 mm end of the graft was excised, this graft slightly spatulated, and sewn end to side to the superficial femoral artery using continuous 6-0 Prolene suture. The graft was then pulled the appropriate length for anastomosis to the saphenous vein. This was ligated distally and spatulated proximal. The graft was cut to appropriate length, spatulated and sewn end to end to the vein using continuous 6-0 Prolene suture. At the completion there was an excellent thrill in the fistula. Hemostasis was obtained in the wounds. The counterincision  was closed with a deep 3-0 Vicryl and skin closed with 4-0 Vicryl. The groin incision was closed with a deep layer of 2-0 Vicryl, except tenuously of 3-0 Vicryl and the skin closely for a septic or stitch. Prevena dressing was applied to the groin incision. Dermabond was applied to the counterincision. All needle and sponge counts were correct. The patient was transferred to the recovery room in stable condition.  Waverly Ferrari, MD, FACS [facility]  DATE OF DICTATION:   01/15/2017

## 2017-01-15 NOTE — Discharge Instructions (Signed)
° ° °  01/15/2017 Shane Hamilton [id number] 07-12-1966  Surgeon(s): Chuck Hint, MD  Procedure(s): INSERTION OF ARTERIOVENOUS (AV) GORE-TEX STRETCH GRAFT THIGH-left  x Do not stick graft for 4 weeks

## 2017-01-15 NOTE — Anesthesia Preprocedure Evaluation (Signed)
Anesthesia Evaluation  Patient identified by MRN, date of birth, ID band Patient awake    Reviewed: Allergy & Precautions, H&P , NPO status , Patient's Chart, lab work & pertinent test results  Airway Mallampati: III  TM Distance: >3 FB Neck ROM: Full    Dental no notable dental hx. (+) Teeth Intact, Dental Advisory Given   Pulmonary sleep apnea and Continuous Positive Airway Pressure Ventilation ,    Pulmonary exam normal breath sounds clear to auscultation       Cardiovascular hypertension, Pt. on medications  Rhythm:Regular Rate:Normal     Neuro/Psych  Headaches, Anxiety negative psych ROS   GI/Hepatic Neg liver ROS, GERD  Medicated and Controlled,  Endo/Other  diabetes, Insulin DependentMorbid obesity  Renal/GU ESRF and DialysisRenal disease  negative genitourinary   Musculoskeletal   Abdominal   Peds  Hematology negative hematology ROS (+) anemia ,   Anesthesia Other Findings   Reproductive/Obstetrics negative OB ROS                             Anesthesia Physical Anesthesia Plan  ASA: III  Anesthesia Plan: General   Post-op Pain Management:    Induction: Intravenous  Airway Management Planned: Oral ETT  Additional Equipment:   Intra-op Plan:   Post-operative Plan: Extubation in OR  Informed Consent: I have reviewed the patients History and Physical, chart, labs and discussed the procedure including the risks, benefits and alternatives for the proposed anesthesia with the patient or authorized representative who has indicated his/her understanding and acceptance.   Dental advisory given  Plan Discussed with: CRNA  Anesthesia Plan Comments:         Anesthesia Quick Evaluation

## 2017-01-15 NOTE — Progress Notes (Signed)
New Admission Note:   Arrival Method: Bed Mental Orientation: A&O X4 Telemetry: Initiated Assessment: Completed Skin: See flowsheets IV: Clean, Dry, Intact, Infusing Pain: Denies Admission: Completed Unit Orientation: Patient has been orientated to the room, unit and staff.  Family: Wife at bedside  Orders have been reviewed and implemented. Will continue to monitor the patient. Call light has been placed within reach and bed alarm has been activated.    Britt Bolognese RN, BSN

## 2017-01-15 NOTE — Progress Notes (Signed)
Edilia Bo, MD notified regarding potassium orders. Verbal order to D/C. Also, MD stated to give three phoslo's with dinner. Orders followed. Will continue to monitor.

## 2017-01-15 NOTE — H&P (View-Only) (Signed)
Patient name: Shane Hamilton MRN: [id number] DOB: [birth year] Sex: male  REASON FOR VISIT: Evaluate for hemodialysis access.  HPI: Shane Hamilton is a 51 y.o. male who I saw in consultation on 11/25/2016 to evaluate for new access. He had a right femoral tunneled dialysis catheter. This was exchanged because of a fibrin sheath on 05/15/2016. He has not had access in the right arm but has a known right central venous occlusion. 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 He underwent a central venogram on 09/13/2015 which showed that the left subclavian and innominate veins were patent.  He was taken to the operating room on 3-18 and I explored the brachial veins. These were chronically occluded. Therefore he could not have access placed in the left arm. I set him up for ABIs and to come in for an office visit to discuss possibly having a thigh graft.   He dialyzes on Tuesdays Thursdays and Saturdays at [facility]. He uses a right femoral tunneled dialysis catheter.  He has been on Coumadin for a long time because of a previous history of DVT.  Past Medical History:  Diagnosis Date  . Abscess of right arm 06/17/2015  . Anemia   . Anxiety   . Arteriovenous fistula infection (HCC)   . Cardiomegaly   . Chronic anticoagulation   . Chronic pancreatitis (HCC)   . DVT (deep venous thrombosis) (HCC) 2011  . ESRD (end stage renal disease) (HCC)    [facility]  . GERD (gastroesophageal reflux disease)   . Gout   . Headache    migraines  . History of kidney stones   . Hypertension   . Kidney stones   . LOC (loss of consciousness) (HCC)   . Morbid obesity (HCC)   . OSA (obstructive sleep apnea)    not using cpap, uses O2   . Pneumonia   . Secondary hyperparathyroidism (HCC)   . Seizures (HCC)    pt denies  . Septic shock(785.52)   . Shortness of breath dyspnea    with exertion  . Type 2 diabetes mellitus,  uncontrolled (HCC)    Type 2    Family History  Problem Relation Age of Onset  . Diabetes Mother   . Hypertension Mother   . Diabetes Father   . Hypertension Father   . Hypertension Brother     SOCIAL HISTORY: Social History  Substance Use Topics  . Smoking status: Never Smoker  . Smokeless tobacco: Never Used  . Alcohol use No    Allergies  Allergen Reactions  . Aspirin Other (See Comments)    Reaction:  GI upset     Current Outpatient Prescriptions  Medication Sig Dispense Refill  . allopurinol (ZYLOPRIM) 300 MG tablet Take 300 mg by mouth 2 (two) times daily.    . BD PEN NEEDLE NANO U/F 32G X 4 MM MISC     . calcium acetate (PHOSLO) 667 MG capsule Take 2,001-2,668 mg by mouth 3 (three) times daily with meals. Also with snacks    . cinacalcet (SENSIPAR) 60 MG tablet Take 60 mg by mouth 3 (three) times a week. Tuesdays, Thursdays, and Saturdays    . clonazePAM (KLONOPIN) 1 MG tablet Take 1 tablet (1 mg total) by mouth 2 (two) times daily as needed for anxiety.    . colchicine 0.6 MG tablet Take 0.6 mg by mouth 2 (two) times daily.     Marland Kitchen  gabapentin (NEURONTIN) 300 MG capsule Take 1 capsule (300 mg total) by mouth 3 (three) times daily as needed (pain).    Marland Kitchen ibuprofen (ADVIL,MOTRIN) 800 MG tablet Take 800 mg by mouth every 8 (eight) hours as needed for moderate pain.    Marland Kitchen LEVEMIR FLEXTOUCH 100 UNIT/ML Pen INJECT 70 UNITS UNDER THE SKIN QHS  6  . lidocaine (XYLOCAINE) 5 % ointment Apply to feet twice daily as needed.  Use with gloves/cotton swab 50 g 3  . multivitamin (RENA-VIT) TABS tablet Take 1 tablet by mouth daily.   3  . nortriptyline (PAMELOR) 10 MG capsule Take 1 capsule (10 mg total) by mouth at bedtime. 90 capsule 3  . NOVOLOG FLEXPEN 100 UNIT/ML FlexPen Inject 35 Units as directed 3 (three) times daily with meals.    Marland Kitchen omeprazole (PRILOSEC) 20 MG capsule Take 20 mg by mouth 2 (two) times daily before a meal.     . Tetrahydrozoline HCl (VISINE OP) Apply 1 drop to  eye daily as needed (dry eyes).    . traMADol (ULTRAM) 50 MG tablet Take 1 tablet (50 mg total) by mouth every 6 (six) hours as needed for moderate pain. 20 tablet 0  . warfarin (COUMADIN) 7.5 MG tablet Take 1 tablet (7.5 mg total) by mouth daily.     No current facility-administered medications for this visit.     REVIEW OF SYSTEMS:   denotes positive finding,  denotes negative finding Cardiac  Comments:  Chest pain or chest pressure:    Shortness of breath upon exertion:    Short of breath when lying flat:    Irregular heart rhythm:        Vascular    Pain in calf, thigh, or hip brought on by ambulation:    Pain in feet at night that wakes you up from your sleep:     Blood clot in your veins:    Leg swelling:         Pulmonary    Oxygen at home:    Productive cough:     Wheezing:         Neurologic    Sudden weakness in arms or legs:     Sudden numbness in arms or legs:     Sudden onset of difficulty speaking or slurred speech:    Temporary loss of vision in one eye:     Problems with dizziness:         Gastrointestinal    Blood in stool:     Vomited blood:         Genitourinary    Burning when urinating:     Blood in urine:        Psychiatric    Major depression:         Hematologic    Bleeding problems:    Problems with blood clotting too easily:        Skin    Rashes or ulcers:        Constitutional    Fever or chills:      PHYSICAL EXAM: Vitals:   12/30/16 1407  BP: 118/72  Pulse: (!) 106  Resp: (!) 21  Temp: 97.6 F (36.4 C)  TempSrc: Oral  SpO2: 95%  Weight: (!) 411 lb 9.6 oz (186.7 kg)  Height:  (1.803 m)    GENERAL: The patient is a well-nourished male, in no acute distress. The vital signs are documented above. CARDIAC: There is a regular rate and rhythm.  VASCULAR: I'm unable to palpate his left femoral pulse because of his obesity. PULMONARY: There is good air exchange bilaterally without wheezing or rales. ABDOMEN:  Soft and non-tender with normal pitched bowel sounds.  MUSCULOSKELETAL: There are no major deformities or cyanosis. NEUROLOGIC: No focal weakness or paresthesias are detected. SKIN: There are no ulcers or rashes noted. PSYCHIATRIC: The patient has a normal affect.  DATA:   Bilateral lower extremity Doppler study: I have reviewed his bilateral lower extremity Doppler study which was done on 12/28/2016. This shows triphasic Doppler signals in the dorsalis pedis and posterior tibial positions bilaterally.  MEDICAL ISSUES:  END-STAGE RENAL DISEASE: He is not an ideal candidate for thigh graft given his obesity. However given that he currently has a temporal tunneled dialysis catheter which is at high-risk for infection I think it is reasonable to attempt a left thigh AV graft. He had normal ABIs and therefore is at lower risk for steal problems. We'll stop his Coumadin 5 days preoperatively. Given the significantly increased risk for wound complications on Coumadin and given his obesity, I would like to hold his Coumadin at least for a week postoperatively or possibly indefinitely. His surgery has been scheduled for a nondialysis day (01/15/2017). We will keep him overnight for observation. He will try to get to his outpatient center the following morning.    Waverly Ferrari [facility] [phone]

## 2017-01-15 NOTE — Progress Notes (Signed)
VASCULAR SURGERY ASSESSMENT & PLAN:   Postop has biphasic doppler signals in PT and DP position left foot.  Good thrill in Left thigh AVG. Prevena with good seal. Plan to D/C Coumadin. D/C in AM so that he can get to outpt HD.  Waverly Ferrari, MD, FACS Beeper [phone] Office: [phone]

## 2017-01-16 DIAGNOSIS — D631 Anemia in chronic kidney disease: Secondary | ICD-10-CM | POA: Diagnosis not present

## 2017-01-16 DIAGNOSIS — N186 End stage renal disease: Secondary | ICD-10-CM | POA: Diagnosis not present

## 2017-01-16 DIAGNOSIS — Z86718 Personal history of other venous thrombosis and embolism: Secondary | ICD-10-CM | POA: Diagnosis not present

## 2017-01-16 DIAGNOSIS — D509 Iron deficiency anemia, unspecified: Secondary | ICD-10-CM | POA: Diagnosis not present

## 2017-01-16 DIAGNOSIS — Z992 Dependence on renal dialysis: Secondary | ICD-10-CM | POA: Diagnosis not present

## 2017-01-16 DIAGNOSIS — I1311 Hypertensive heart and chronic kidney disease without heart failure, with stage 5 chronic kidney disease, or end stage renal disease: Secondary | ICD-10-CM | POA: Diagnosis not present

## 2017-01-16 DIAGNOSIS — E1122 Type 2 diabetes mellitus with diabetic chronic kidney disease: Secondary | ICD-10-CM | POA: Diagnosis not present

## 2017-01-16 DIAGNOSIS — T8249XD Other complication of vascular dialysis catheter, subsequent encounter: Secondary | ICD-10-CM | POA: Diagnosis not present

## 2017-01-16 DIAGNOSIS — N2581 Secondary hyperparathyroidism of renal origin: Secondary | ICD-10-CM | POA: Diagnosis not present

## 2017-01-16 DIAGNOSIS — E1151 Type 2 diabetes mellitus with diabetic peripheral angiopathy without gangrene: Secondary | ICD-10-CM | POA: Diagnosis not present

## 2017-01-16 DIAGNOSIS — E1129 Type 2 diabetes mellitus with other diabetic kidney complication: Secondary | ICD-10-CM | POA: Diagnosis not present

## 2017-01-16 LAB — HIV ANTIBODY (ROUTINE TESTING W REFLEX): HIV Screen 4th Generation wRfx: NONREACTIVE

## 2017-01-16 LAB — GLUCOSE, CAPILLARY: GLUCOSE-CAPILLARY: 225 mg/dL — AB (ref 65–99)

## 2017-01-16 NOTE — Progress Notes (Signed)
Pt alert and oriented x 4. No complaints. Vitals stable. Discharge instructions given. Verbalizes understanding.  Discharge home with wife via wheel chair.

## 2017-01-18 ENCOUNTER — Ambulatory Visit: Payer: Medicare Other | Admitting: *Deleted

## 2017-01-18 ENCOUNTER — Encounter (HOSPITAL_COMMUNITY): Payer: Self-pay | Admitting: Vascular Surgery

## 2017-01-18 DIAGNOSIS — Z4801 Encounter for change or removal of surgical wound dressing: Secondary | ICD-10-CM

## 2017-01-18 DIAGNOSIS — Z992 Dependence on renal dialysis: Principal | ICD-10-CM

## 2017-01-18 DIAGNOSIS — N186 End stage renal disease: Secondary | ICD-10-CM

## 2017-01-19 ENCOUNTER — Other Ambulatory Visit (HOSPITAL_COMMUNITY): Payer: Self-pay | Admitting: Nephrology

## 2017-01-19 ENCOUNTER — Telehealth: Payer: Self-pay | Admitting: *Deleted

## 2017-01-19 DIAGNOSIS — N2581 Secondary hyperparathyroidism of renal origin: Secondary | ICD-10-CM | POA: Diagnosis not present

## 2017-01-19 DIAGNOSIS — E1129 Type 2 diabetes mellitus with other diabetic kidney complication: Secondary | ICD-10-CM | POA: Diagnosis not present

## 2017-01-19 DIAGNOSIS — D631 Anemia in chronic kidney disease: Secondary | ICD-10-CM | POA: Diagnosis not present

## 2017-01-19 DIAGNOSIS — T8241XA Breakdown (mechanical) of vascular dialysis catheter, initial encounter: Secondary | ICD-10-CM

## 2017-01-19 DIAGNOSIS — D509 Iron deficiency anemia, unspecified: Secondary | ICD-10-CM | POA: Diagnosis not present

## 2017-01-19 DIAGNOSIS — T8249XD Other complication of vascular dialysis catheter, subsequent encounter: Secondary | ICD-10-CM | POA: Diagnosis not present

## 2017-01-19 DIAGNOSIS — N186 End stage renal disease: Secondary | ICD-10-CM | POA: Diagnosis not present

## 2017-01-19 NOTE — Progress Notes (Signed)
Shane Hamilton called to triage to report that his Prevena Wound VAC had not been running x 1 day. It was applied to his left groin in order to capture any fluids coming out of his Left femoral AVG incision done on 01-15-17 by Dr. Edilia Bo. I contacted the [employer] rep and she called him to help him cut it on or troubleshoot the problem. This was unsuccessful. After discussing this with Lelon Mast, I told Shane Hamilton to come in to the office so that I could remove the dressing.   When he came in, I removed the wound VAC from the site (the Op site was not attached to the skin and the seal was broken) without any difficulty. I went over wound care of the area and the importance of keeping the groin clean and dry. I also gave him several ABD pads to use over his incision. The patient has quite a large pannus and I showed him out the use the pads effectively. He voiced understanding and agreement. He will keep his scheduled appt on 01-27-17.

## 2017-01-19 NOTE — Telephone Encounter (Signed)
Received a call from Nicklas today that while he was at HD [phone] his right femoral TDC "came out a little" and he was supposed to go somewhere tomorrow for repair. I called Tarsha at the [facility] and she said that he was to report at 9:00am tomorrow at [facility] to have this fixed. I informed Kody of this plan. He voiced understanding.

## 2017-01-20 ENCOUNTER — Encounter: Payer: Self-pay | Admitting: Vascular Surgery

## 2017-01-20 ENCOUNTER — Encounter (HOSPITAL_COMMUNITY): Payer: Self-pay | Admitting: Interventional Radiology

## 2017-01-20 ENCOUNTER — Ambulatory Visit (HOSPITAL_COMMUNITY)
Admission: RE | Admit: 2017-01-20 | Discharge: 2017-01-20 | Disposition: A | Discharge status: Home / Self Care | Payer: Medicare Other | Source: Ambulatory Visit | Attending: Nephrology | Admitting: Nephrology

## 2017-01-20 DIAGNOSIS — Z4901 Encounter for fitting and adjustment of extracorporeal dialysis catheter: Secondary | ICD-10-CM | POA: Diagnosis not present

## 2017-01-20 DIAGNOSIS — Z992 Dependence on renal dialysis: Secondary | ICD-10-CM | POA: Diagnosis not present

## 2017-01-20 DIAGNOSIS — T82898A Other specified complication of vascular prosthetic devices, implants and grafts, initial encounter: Secondary | ICD-10-CM | POA: Diagnosis not present

## 2017-01-20 DIAGNOSIS — T8241XA Breakdown (mechanical) of vascular dialysis catheter, initial encounter: Secondary | ICD-10-CM

## 2017-01-20 DIAGNOSIS — Y831 Surgical operation with implant of artificial internal device as the cause of abnormal reaction of the patient, or of later complication, without mention of misadventure at the time of the procedure: Secondary | ICD-10-CM | POA: Diagnosis not present

## 2017-01-20 DIAGNOSIS — N186 End stage renal disease: Secondary | ICD-10-CM | POA: Insufficient documentation

## 2017-01-20 DIAGNOSIS — N19 Unspecified kidney failure: Secondary | ICD-10-CM | POA: Diagnosis not present

## 2017-01-20 HISTORY — PX: IR FLUORO GUIDE CV LINE RIGHT: IMG2283

## 2017-01-20 MED ORDER — HEPARIN SODIUM (PORCINE) 1000 UNIT/ML IJ SOLN
INTRAMUSCULAR | Status: AC
  Filled 2017-01-20: qty 1

## 2017-01-20 MED ORDER — CEFAZOLIN SODIUM-DEXTROSE 2-4 GM/100ML-% IV SOLN
2.0000 g | INTRAVENOUS | Status: AC
  Administered 2017-01-20: 2 g via INTRAVENOUS

## 2017-01-20 MED ORDER — LIDOCAINE HCL 1 % IJ SOLN
INTRAMUSCULAR | Status: DC | PRN
  Administered 2017-01-20: 10 mL

## 2017-01-20 MED ORDER — CEFAZOLIN SODIUM-DEXTROSE 2-4 GM/100ML-% IV SOLN
INTRAVENOUS | Status: AC
  Filled 2017-01-20: qty 100

## 2017-01-20 MED ORDER — LIDOCAINE HCL 1 % IJ SOLN
INTRAMUSCULAR | Status: AC
  Filled 2017-01-20: qty 20

## 2017-01-20 MED ORDER — SODIUM CHLORIDE 0.9 % IV SOLN: INTRAVENOUS | Status: DC

## 2017-01-20 MED ORDER — CHLORHEXIDINE GLUCONATE 4 % EX LIQD
CUTANEOUS | Status: AC
  Filled 2017-01-20: qty 15

## 2017-01-20 NOTE — Procedures (Signed)
R CFV HD cath exchange 50 cm EBL 0 Comp 0

## 2017-01-21 DIAGNOSIS — T8249XD Other complication of vascular dialysis catheter, subsequent encounter: Secondary | ICD-10-CM | POA: Diagnosis not present

## 2017-01-21 DIAGNOSIS — N186 End stage renal disease: Secondary | ICD-10-CM | POA: Diagnosis not present

## 2017-01-21 DIAGNOSIS — D509 Iron deficiency anemia, unspecified: Secondary | ICD-10-CM | POA: Diagnosis not present

## 2017-01-21 DIAGNOSIS — D631 Anemia in chronic kidney disease: Secondary | ICD-10-CM | POA: Diagnosis not present

## 2017-01-21 DIAGNOSIS — N2581 Secondary hyperparathyroidism of renal origin: Secondary | ICD-10-CM | POA: Diagnosis not present

## 2017-01-21 DIAGNOSIS — E1129 Type 2 diabetes mellitus with other diabetic kidney complication: Secondary | ICD-10-CM | POA: Diagnosis not present

## 2017-01-23 DIAGNOSIS — E1129 Type 2 diabetes mellitus with other diabetic kidney complication: Secondary | ICD-10-CM | POA: Diagnosis not present

## 2017-01-23 DIAGNOSIS — N186 End stage renal disease: Secondary | ICD-10-CM | POA: Diagnosis not present

## 2017-01-23 DIAGNOSIS — D631 Anemia in chronic kidney disease: Secondary | ICD-10-CM | POA: Diagnosis not present

## 2017-01-23 DIAGNOSIS — T8249XD Other complication of vascular dialysis catheter, subsequent encounter: Secondary | ICD-10-CM | POA: Diagnosis not present

## 2017-01-23 DIAGNOSIS — N2581 Secondary hyperparathyroidism of renal origin: Secondary | ICD-10-CM | POA: Diagnosis not present

## 2017-01-23 DIAGNOSIS — D509 Iron deficiency anemia, unspecified: Secondary | ICD-10-CM | POA: Diagnosis not present

## 2017-01-26 ENCOUNTER — Other Ambulatory Visit (HOSPITAL_COMMUNITY): Payer: Self-pay | Admitting: Nephrology

## 2017-01-26 ENCOUNTER — Other Ambulatory Visit: Payer: Self-pay | Admitting: Radiology

## 2017-01-26 DIAGNOSIS — D631 Anemia in chronic kidney disease: Secondary | ICD-10-CM | POA: Diagnosis not present

## 2017-01-26 DIAGNOSIS — T8249XD Other complication of vascular dialysis catheter, subsequent encounter: Secondary | ICD-10-CM | POA: Diagnosis not present

## 2017-01-26 DIAGNOSIS — D509 Iron deficiency anemia, unspecified: Secondary | ICD-10-CM | POA: Diagnosis not present

## 2017-01-26 DIAGNOSIS — N186 End stage renal disease: Secondary | ICD-10-CM

## 2017-01-26 DIAGNOSIS — E1129 Type 2 diabetes mellitus with other diabetic kidney complication: Secondary | ICD-10-CM | POA: Diagnosis not present

## 2017-01-26 DIAGNOSIS — N2581 Secondary hyperparathyroidism of renal origin: Secondary | ICD-10-CM | POA: Diagnosis not present

## 2017-01-27 ENCOUNTER — Encounter (HOSPITAL_COMMUNITY): Payer: Self-pay | Admitting: Interventional Radiology

## 2017-01-27 ENCOUNTER — Ambulatory Visit (HOSPITAL_COMMUNITY)
Admission: RE | Admit: 2017-01-27 | Discharge: 2017-01-27 | Disposition: A | Discharge status: Home / Self Care | Payer: Medicare Other | Source: Ambulatory Visit | Attending: Nephrology | Admitting: Nephrology

## 2017-01-27 DIAGNOSIS — N186 End stage renal disease: Secondary | ICD-10-CM | POA: Diagnosis not present

## 2017-01-27 DIAGNOSIS — Z4901 Encounter for fitting and adjustment of extracorporeal dialysis catheter: Secondary | ICD-10-CM | POA: Insufficient documentation

## 2017-01-27 DIAGNOSIS — T8249XA Other complication of vascular dialysis catheter, initial encounter: Secondary | ICD-10-CM | POA: Diagnosis not present

## 2017-01-27 HISTORY — PX: IR FLUORO GUIDE CV LINE RIGHT: IMG2283

## 2017-01-27 MED ORDER — CEFAZOLIN SODIUM-DEXTROSE 2-4 GM/100ML-% IV SOLN
INTRAVENOUS | Status: AC
  Filled 2017-01-27: qty 100

## 2017-01-27 MED ORDER — LIDOCAINE HCL 1 % IJ SOLN
INTRAMUSCULAR | Status: AC
  Filled 2017-01-27: qty 20

## 2017-01-27 MED ORDER — HEPARIN SODIUM (PORCINE) 1000 UNIT/ML IJ SOLN
INTRAMUSCULAR | Status: AC
  Filled 2017-01-27: qty 1

## 2017-01-27 MED ORDER — SODIUM CHLORIDE 0.9 % IV SOLN: INTRAVENOUS | Status: DC

## 2017-01-27 MED ORDER — IOPAMIDOL (ISOVUE-300) INJECTION 61%
INTRAVENOUS | Status: AC
  Filled 2017-01-27: qty 50

## 2017-01-27 MED ORDER — DEXTROSE 5 % IV SOLN
3.0000 g | INTRAVENOUS | Status: DC
  Filled 2017-01-27: qty 3000

## 2017-01-27 MED ORDER — CEFAZOLIN SODIUM-DEXTROSE 2-4 GM/100ML-% IV SOLN
INTRAVENOUS | Status: AC
  Administered 2017-01-27: 2000 mg
  Filled 2017-01-27: qty 100

## 2017-01-27 MED ORDER — CHLORHEXIDINE GLUCONATE 4 % EX LIQD
CUTANEOUS | Status: AC
  Filled 2017-01-27: qty 15

## 2017-01-27 MED ORDER — LIDOCAINE HCL (PF) 1 % IJ SOLN
INTRAMUSCULAR | Status: DC | PRN
  Administered 2017-01-27: 10 mL

## 2017-01-27 NOTE — Procedures (Signed)
Successful fluoroscopic guided exchange of existing right femoral approach dialysis catheter.   Catheter is ready for immediate use.   EBL: None No immediate post procedural complications.   Jay Anna Livers, MD Pager #: [phone]  

## 2017-01-28 DIAGNOSIS — I82599 Chronic embolism and thrombosis of other specified deep vein of unspecified lower extremity: Secondary | ICD-10-CM | POA: Diagnosis not present

## 2017-01-28 DIAGNOSIS — Z5181 Encounter for therapeutic drug level monitoring: Secondary | ICD-10-CM | POA: Diagnosis not present

## 2017-01-28 DIAGNOSIS — E1129 Type 2 diabetes mellitus with other diabetic kidney complication: Secondary | ICD-10-CM | POA: Diagnosis not present

## 2017-01-28 DIAGNOSIS — N2581 Secondary hyperparathyroidism of renal origin: Secondary | ICD-10-CM | POA: Diagnosis not present

## 2017-01-28 DIAGNOSIS — N186 End stage renal disease: Secondary | ICD-10-CM | POA: Diagnosis not present

## 2017-01-28 DIAGNOSIS — D509 Iron deficiency anemia, unspecified: Secondary | ICD-10-CM | POA: Diagnosis not present

## 2017-01-28 DIAGNOSIS — D631 Anemia in chronic kidney disease: Secondary | ICD-10-CM | POA: Diagnosis not present

## 2017-01-28 DIAGNOSIS — T8249XD Other complication of vascular dialysis catheter, subsequent encounter: Secondary | ICD-10-CM | POA: Diagnosis not present

## 2017-01-28 DIAGNOSIS — Z7901 Long term (current) use of anticoagulants: Secondary | ICD-10-CM | POA: Diagnosis not present

## 2017-01-30 DIAGNOSIS — D509 Iron deficiency anemia, unspecified: Secondary | ICD-10-CM | POA: Diagnosis not present

## 2017-01-30 DIAGNOSIS — N186 End stage renal disease: Secondary | ICD-10-CM | POA: Diagnosis not present

## 2017-01-30 DIAGNOSIS — D631 Anemia in chronic kidney disease: Secondary | ICD-10-CM | POA: Diagnosis not present

## 2017-01-30 DIAGNOSIS — N2581 Secondary hyperparathyroidism of renal origin: Secondary | ICD-10-CM | POA: Diagnosis not present

## 2017-01-30 DIAGNOSIS — E1129 Type 2 diabetes mellitus with other diabetic kidney complication: Secondary | ICD-10-CM | POA: Diagnosis not present

## 2017-01-30 DIAGNOSIS — T8249XD Other complication of vascular dialysis catheter, subsequent encounter: Secondary | ICD-10-CM | POA: Diagnosis not present

## 2017-02-01 DIAGNOSIS — E1129 Type 2 diabetes mellitus with other diabetic kidney complication: Secondary | ICD-10-CM | POA: Diagnosis not present

## 2017-02-01 DIAGNOSIS — N186 End stage renal disease: Secondary | ICD-10-CM | POA: Diagnosis not present

## 2017-02-01 DIAGNOSIS — Z992 Dependence on renal dialysis: Secondary | ICD-10-CM | POA: Diagnosis not present

## 2017-02-02 ENCOUNTER — Other Ambulatory Visit: Payer: Self-pay | Admitting: General Surgery

## 2017-02-02 ENCOUNTER — Other Ambulatory Visit (HOSPITAL_COMMUNITY): Payer: Self-pay | Admitting: Nephrology

## 2017-02-02 ENCOUNTER — Other Ambulatory Visit: Payer: Self-pay | Admitting: Radiology

## 2017-02-02 DIAGNOSIS — T8241XA Breakdown (mechanical) of vascular dialysis catheter, initial encounter: Secondary | ICD-10-CM

## 2017-02-02 DIAGNOSIS — E1129 Type 2 diabetes mellitus with other diabetic kidney complication: Secondary | ICD-10-CM | POA: Diagnosis not present

## 2017-02-02 DIAGNOSIS — N2581 Secondary hyperparathyroidism of renal origin: Secondary | ICD-10-CM | POA: Diagnosis not present

## 2017-02-02 DIAGNOSIS — N186 End stage renal disease: Secondary | ICD-10-CM | POA: Diagnosis not present

## 2017-02-02 DIAGNOSIS — D631 Anemia in chronic kidney disease: Secondary | ICD-10-CM | POA: Diagnosis not present

## 2017-02-03 ENCOUNTER — Other Ambulatory Visit (HOSPITAL_COMMUNITY): Payer: Self-pay | Admitting: Nephrology

## 2017-02-03 ENCOUNTER — Encounter (HOSPITAL_COMMUNITY): Payer: Self-pay | Admitting: Interventional Radiology

## 2017-02-03 ENCOUNTER — Ambulatory Visit (HOSPITAL_COMMUNITY)
Admission: RE | Admit: 2017-02-03 | Discharge: 2017-02-03 | Disposition: A | Discharge status: Home / Self Care | Payer: Medicare Other | Source: Ambulatory Visit | Attending: Nephrology | Admitting: Nephrology

## 2017-02-03 DIAGNOSIS — T8241XA Breakdown (mechanical) of vascular dialysis catheter, initial encounter: Secondary | ICD-10-CM

## 2017-02-03 DIAGNOSIS — N2581 Secondary hyperparathyroidism of renal origin: Secondary | ICD-10-CM | POA: Diagnosis not present

## 2017-02-03 DIAGNOSIS — T82898A Other specified complication of vascular prosthetic devices, implants and grafts, initial encounter: Secondary | ICD-10-CM | POA: Diagnosis not present

## 2017-02-03 DIAGNOSIS — Z4901 Encounter for fitting and adjustment of extracorporeal dialysis catheter: Secondary | ICD-10-CM | POA: Diagnosis not present

## 2017-02-03 DIAGNOSIS — E1129 Type 2 diabetes mellitus with other diabetic kidney complication: Secondary | ICD-10-CM | POA: Diagnosis not present

## 2017-02-03 DIAGNOSIS — N186 End stage renal disease: Secondary | ICD-10-CM | POA: Diagnosis not present

## 2017-02-03 DIAGNOSIS — D631 Anemia in chronic kidney disease: Secondary | ICD-10-CM | POA: Diagnosis not present

## 2017-02-03 HISTORY — PX: IR FLUORO GUIDE CV LINE RIGHT: IMG2283

## 2017-02-03 HISTORY — PX: IR VENOCAVAGRAM IVC: IMG678

## 2017-02-03 MED ORDER — LIDOCAINE HCL 1 % IJ SOLN
INTRAMUSCULAR | Status: DC | PRN
  Administered 2017-02-03: 10 mL

## 2017-02-03 MED ORDER — CHLORHEXIDINE GLUCONATE 4 % EX LIQD
CUTANEOUS | Status: AC
  Filled 2017-02-03: qty 15

## 2017-02-03 MED ORDER — CHLORHEXIDINE GLUCONATE 4 % EX LIQD
CUTANEOUS | Status: DC | PRN
  Administered 2017-02-03: 1 via TOPICAL

## 2017-02-03 MED ORDER — HEPARIN SODIUM (PORCINE) 1000 UNIT/ML IJ SOLN
INTRAMUSCULAR | Status: AC
  Filled 2017-02-03: qty 1

## 2017-02-03 MED ORDER — CEFAZOLIN SODIUM-DEXTROSE 2-4 GM/100ML-% IV SOLN
2.0000 g | INTRAVENOUS | Status: AC
  Administered 2017-02-03: 2 g via INTRAVENOUS

## 2017-02-03 MED ORDER — CEFAZOLIN SODIUM-DEXTROSE 2-4 GM/100ML-% IV SOLN
INTRAVENOUS | Status: AC
  Filled 2017-02-03: qty 100

## 2017-02-03 MED ORDER — IOPAMIDOL (ISOVUE-300) INJECTION 61%
INTRAVENOUS | Status: AC
  Administered 2017-02-03: 8 mL
  Filled 2017-02-03: qty 50

## 2017-02-03 MED ORDER — LIDOCAINE HCL 1 % IJ SOLN
INTRAMUSCULAR | Status: AC
  Filled 2017-02-03: qty 20

## 2017-02-03 NOTE — Procedures (Signed)
ESRD  s/p RT FEM HD CATH EXCHG  No comp Stable Tip IVC RA junction Ready for use Full report in PACS

## 2017-02-04 ENCOUNTER — Ambulatory Visit (INDEPENDENT_AMBULATORY_CARE_PROVIDER_SITE_OTHER): Payer: Self-pay | Admitting: Vascular Surgery

## 2017-02-04 VITALS — BP 123/73 | HR 105 | Resp 18 | Ht 71.0 in | Wt 395.7 lb

## 2017-02-04 DIAGNOSIS — Z992 Dependence on renal dialysis: Secondary | ICD-10-CM

## 2017-02-04 DIAGNOSIS — N186 End stage renal disease: Secondary | ICD-10-CM

## 2017-02-04 DIAGNOSIS — N2581 Secondary hyperparathyroidism of renal origin: Secondary | ICD-10-CM | POA: Diagnosis not present

## 2017-02-04 DIAGNOSIS — E1129 Type 2 diabetes mellitus with other diabetic kidney complication: Secondary | ICD-10-CM | POA: Diagnosis not present

## 2017-02-04 DIAGNOSIS — D631 Anemia in chronic kidney disease: Secondary | ICD-10-CM | POA: Diagnosis not present

## 2017-02-04 NOTE — Progress Notes (Signed)
Patient name: Shane Hamilton MRN: [id number] DOB: 11/02/1965 Sex: male  REASON FOR VISIT: Postop  HPI: Shane Hamilton is a 51 y.o. male who presents for follow-up status post left AV thigh graft. He did not have any options for upper extremity access given central venous and brachial vein occlusions. There was reluctance to placing a thigh graft given his morbid obesity. The patient has been very compliant with hygiene to his groin area. His incisions have healed. He is currently using a right femoral catheter for dialysis. He denies any pain or numbness with his left leg or foot.  Current Outpatient Prescriptions  Medication Sig Dispense Refill  . allopurinol (ZYLOPRIM) 300 MG tablet Take 600 mg by mouth daily.     . BD PEN NEEDLE NANO U/F 32G X 4 MM MISC     . calcium acetate (PHOSLO) 667 MG capsule Take 2,001-2,668 mg by mouth 3 (three) times daily with meals. Also with snacks    . cinacalcet (SENSIPAR) 60 MG tablet Take 60 mg by mouth 3 (three) times a week. Tuesdays, Thursdays, and Saturdays    . clonazePAM (KLONOPIN) 1 MG tablet Take 1 tablet (1 mg total) by mouth 2 (two) times daily as needed for anxiety.    . colchicine 0.6 MG tablet Take 1.2 mg by mouth daily.     Marland Kitchen. gabapentin (NEURONTIN) 300 MG capsule Take 1 capsule (300 mg total) by mouth 3 (three) times daily as needed (pain).    Marland Kitchen. ibuprofen (ADVIL,MOTRIN) 800 MG tablet Take 800 mg by mouth every 8 (eight) hours as needed for moderate pain.    Marland Kitchen. LEVEMIR FLEXTOUCH 100 UNIT/ML Pen INJECT 70 UNITS UNDER THE SKIN QHS  6  . lidocaine (XYLOCAINE) 5 % ointment Apply to feet twice daily as needed.  Use with gloves/cotton swab 50 g 3  . multivitamin (RENA-VIT) TABS tablet Take 1 tablet by mouth daily.   3  . nortriptyline (PAMELOR) 10 MG capsule Take 1 capsule (10 mg total) by mouth at bedtime. 90 capsule 3  . NOVOLOG FLEXPEN 100 UNIT/ML FlexPen Inject 35 Units as directed 3 (three) times daily with meals.    Marland Kitchen. omeprazole (PRILOSEC)  40 MG capsule TK 1 C PO QD  5  . Tetrahydrozoline HCl (VISINE OP) Apply 1 drop to eye daily as needed (dry eyes).    . traMADol (ULTRAM) 50 MG tablet Take 1 tablet (50 mg total) by mouth every 6 (six) hours as needed for moderate pain. 20 tablet 0  . warfarin (COUMADIN) 7.5 MG tablet TK 1 T PO  QOD  3  . omeprazole (PRILOSEC) 20 MG capsule Take 20 mg by mouth 2 (two) times daily before a meal.      No current facility-administered medications for this visit.     REVIEW OF SYSTEMS:  [X]  denotes positive finding, [ ]  denotes negative finding Cardiac  Comments:  Chest pain or chest pressure:    Shortness of breath upon exertion:    Short of breath when lying flat:    Irregular heart rhythm:    Constitutional    Fever or chills:      PHYSICAL EXAM: Vitals:   02/04/17 1448  BP: 123/73  Pulse: (!) 105  Resp: 18  Weight: (!) 395 lb 11.6 oz (179.5 kg)  Height: 5\' 11"  (1.803 m)    GENERAL: The patient is a well-nourished male, in no acute distress. The vital signs are documented above. PULMONARY: There is good air exchange bilaterally.  VASCULAR: Left groin and distal thigh incisions healed. No erythema. Faintly palpable thrill in AV thigh graft. Audible bruit throughout graft.  MEDICAL ISSUES: ESRD on HD s/p left AV thigh graft  The patient's left thigh incisions have healed. His thigh graft is patent. He denies any steal symptoms to his left foot and leg. His graft can be cannulated on 02/14/2017. After successful cannulation of this graft, his right femoral catheter can be removed. He will follow-up as needed.  Maris Berger, PA-C [facility] MD: Darrick Penna

## 2017-02-06 DIAGNOSIS — E1129 Type 2 diabetes mellitus with other diabetic kidney complication: Secondary | ICD-10-CM | POA: Diagnosis not present

## 2017-02-06 DIAGNOSIS — D631 Anemia in chronic kidney disease: Secondary | ICD-10-CM | POA: Diagnosis not present

## 2017-02-06 DIAGNOSIS — N186 End stage renal disease: Secondary | ICD-10-CM | POA: Diagnosis not present

## 2017-02-06 DIAGNOSIS — N2581 Secondary hyperparathyroidism of renal origin: Secondary | ICD-10-CM | POA: Diagnosis not present

## 2017-02-09 DIAGNOSIS — N186 End stage renal disease: Secondary | ICD-10-CM | POA: Diagnosis not present

## 2017-02-09 DIAGNOSIS — N2581 Secondary hyperparathyroidism of renal origin: Secondary | ICD-10-CM | POA: Diagnosis not present

## 2017-02-09 DIAGNOSIS — D631 Anemia in chronic kidney disease: Secondary | ICD-10-CM | POA: Diagnosis not present

## 2017-02-09 DIAGNOSIS — E1129 Type 2 diabetes mellitus with other diabetic kidney complication: Secondary | ICD-10-CM | POA: Diagnosis not present

## 2017-02-11 DIAGNOSIS — D631 Anemia in chronic kidney disease: Secondary | ICD-10-CM | POA: Diagnosis not present

## 2017-02-11 DIAGNOSIS — N2581 Secondary hyperparathyroidism of renal origin: Secondary | ICD-10-CM | POA: Diagnosis not present

## 2017-02-11 DIAGNOSIS — N186 End stage renal disease: Secondary | ICD-10-CM | POA: Diagnosis not present

## 2017-02-11 DIAGNOSIS — E1129 Type 2 diabetes mellitus with other diabetic kidney complication: Secondary | ICD-10-CM | POA: Diagnosis not present

## 2017-02-13 DIAGNOSIS — N186 End stage renal disease: Secondary | ICD-10-CM | POA: Diagnosis not present

## 2017-02-13 DIAGNOSIS — D631 Anemia in chronic kidney disease: Secondary | ICD-10-CM | POA: Diagnosis not present

## 2017-02-13 DIAGNOSIS — E1129 Type 2 diabetes mellitus with other diabetic kidney complication: Secondary | ICD-10-CM | POA: Diagnosis not present

## 2017-02-13 DIAGNOSIS — N2581 Secondary hyperparathyroidism of renal origin: Secondary | ICD-10-CM | POA: Diagnosis not present

## 2017-02-16 DIAGNOSIS — N2581 Secondary hyperparathyroidism of renal origin: Secondary | ICD-10-CM | POA: Diagnosis not present

## 2017-02-16 DIAGNOSIS — D631 Anemia in chronic kidney disease: Secondary | ICD-10-CM | POA: Diagnosis not present

## 2017-02-16 DIAGNOSIS — N186 End stage renal disease: Secondary | ICD-10-CM | POA: Diagnosis not present

## 2017-02-16 DIAGNOSIS — E1129 Type 2 diabetes mellitus with other diabetic kidney complication: Secondary | ICD-10-CM | POA: Diagnosis not present

## 2017-02-18 DIAGNOSIS — E1129 Type 2 diabetes mellitus with other diabetic kidney complication: Secondary | ICD-10-CM | POA: Diagnosis not present

## 2017-02-18 DIAGNOSIS — N186 End stage renal disease: Secondary | ICD-10-CM | POA: Diagnosis not present

## 2017-02-18 DIAGNOSIS — N2581 Secondary hyperparathyroidism of renal origin: Secondary | ICD-10-CM | POA: Diagnosis not present

## 2017-02-18 DIAGNOSIS — D631 Anemia in chronic kidney disease: Secondary | ICD-10-CM | POA: Diagnosis not present

## 2017-02-20 DIAGNOSIS — D631 Anemia in chronic kidney disease: Secondary | ICD-10-CM | POA: Diagnosis not present

## 2017-02-20 DIAGNOSIS — N186 End stage renal disease: Secondary | ICD-10-CM | POA: Diagnosis not present

## 2017-02-20 DIAGNOSIS — E1129 Type 2 diabetes mellitus with other diabetic kidney complication: Secondary | ICD-10-CM | POA: Diagnosis not present

## 2017-02-20 DIAGNOSIS — N2581 Secondary hyperparathyroidism of renal origin: Secondary | ICD-10-CM | POA: Diagnosis not present

## 2017-02-22 ENCOUNTER — Other Ambulatory Visit (HOSPITAL_COMMUNITY): Payer: Self-pay | Admitting: Nephrology

## 2017-02-22 ENCOUNTER — Other Ambulatory Visit: Payer: Self-pay | Admitting: Radiology

## 2017-02-22 ENCOUNTER — Telehealth: Payer: Self-pay | Admitting: *Deleted

## 2017-02-22 ENCOUNTER — Encounter (HOSPITAL_COMMUNITY): Payer: Self-pay

## 2017-02-22 ENCOUNTER — Ambulatory Visit (HOSPITAL_COMMUNITY)
Admission: RE | Admit: 2017-02-22 | Discharge: 2017-02-22 | Disposition: A | Discharge status: Home / Self Care | Payer: Medicare Other | Source: Ambulatory Visit | Attending: Nephrology | Admitting: Nephrology

## 2017-02-22 DIAGNOSIS — Z6841 Body Mass Index (BMI) 40.0 and over, adult: Secondary | ICD-10-CM | POA: Insufficient documentation

## 2017-02-22 DIAGNOSIS — Z794 Long term (current) use of insulin: Secondary | ICD-10-CM | POA: Diagnosis not present

## 2017-02-22 DIAGNOSIS — K219 Gastro-esophageal reflux disease without esophagitis: Secondary | ICD-10-CM | POA: Diagnosis not present

## 2017-02-22 DIAGNOSIS — M109 Gout, unspecified: Secondary | ICD-10-CM | POA: Insufficient documentation

## 2017-02-22 DIAGNOSIS — I517 Cardiomegaly: Secondary | ICD-10-CM | POA: Insufficient documentation

## 2017-02-22 DIAGNOSIS — Z992 Dependence on renal dialysis: Secondary | ICD-10-CM | POA: Diagnosis not present

## 2017-02-22 DIAGNOSIS — K861 Other chronic pancreatitis: Secondary | ICD-10-CM | POA: Diagnosis not present

## 2017-02-22 DIAGNOSIS — N2581 Secondary hyperparathyroidism of renal origin: Secondary | ICD-10-CM | POA: Diagnosis not present

## 2017-02-22 DIAGNOSIS — G4733 Obstructive sleep apnea (adult) (pediatric): Secondary | ICD-10-CM | POA: Diagnosis not present

## 2017-02-22 DIAGNOSIS — G43909 Migraine, unspecified, not intractable, without status migrainosus: Secondary | ICD-10-CM | POA: Insufficient documentation

## 2017-02-22 DIAGNOSIS — N186 End stage renal disease: Secondary | ICD-10-CM

## 2017-02-22 DIAGNOSIS — Z89011 Acquired absence of right thumb: Secondary | ICD-10-CM | POA: Diagnosis not present

## 2017-02-22 DIAGNOSIS — T85868A Thrombosis due to other internal prosthetic devices, implants and grafts, initial encounter: Secondary | ICD-10-CM | POA: Diagnosis not present

## 2017-02-22 DIAGNOSIS — T82858A Stenosis of vascular prosthetic devices, implants and grafts, initial encounter: Secondary | ICD-10-CM | POA: Diagnosis not present

## 2017-02-22 DIAGNOSIS — T82868A Thrombosis of vascular prosthetic devices, implants and grafts, initial encounter: Secondary | ICD-10-CM | POA: Insufficient documentation

## 2017-02-22 DIAGNOSIS — Y832 Surgical operation with anastomosis, bypass or graft as the cause of abnormal reaction of the patient, or of later complication, without mention of misadventure at the time of the procedure: Secondary | ICD-10-CM | POA: Diagnosis not present

## 2017-02-22 DIAGNOSIS — E1122 Type 2 diabetes mellitus with diabetic chronic kidney disease: Secondary | ICD-10-CM | POA: Insufficient documentation

## 2017-02-22 DIAGNOSIS — I120 Hypertensive chronic kidney disease with stage 5 chronic kidney disease or end stage renal disease: Secondary | ICD-10-CM | POA: Diagnosis not present

## 2017-02-22 DIAGNOSIS — F419 Anxiety disorder, unspecified: Secondary | ICD-10-CM | POA: Insufficient documentation

## 2017-02-22 DIAGNOSIS — Z7901 Long term (current) use of anticoagulants: Secondary | ICD-10-CM | POA: Diagnosis not present

## 2017-02-22 DIAGNOSIS — Z8249 Family history of ischemic heart disease and other diseases of the circulatory system: Secondary | ICD-10-CM | POA: Insufficient documentation

## 2017-02-22 DIAGNOSIS — D631 Anemia in chronic kidney disease: Secondary | ICD-10-CM | POA: Insufficient documentation

## 2017-02-22 HISTORY — PX: IR THROMBECTOMY AV FISTULA W/THROMBOLYSIS/PTA INC/SHUNT/IMG LEFT: IMG6106

## 2017-02-22 HISTORY — PX: IR US GUIDE VASC ACCESS LEFT: IMG2389

## 2017-02-22 MED ORDER — IOPAMIDOL (ISOVUE-300) INJECTION 61%
INTRAVENOUS | Status: AC
  Administered 2017-02-22: 100 mL
  Filled 2017-02-22: qty 100

## 2017-02-22 MED ORDER — HEPARIN SODIUM (PORCINE) 1000 UNIT/ML IJ SOLN
INTRAMUSCULAR | Status: AC
  Filled 2017-02-22: qty 1

## 2017-02-22 MED ORDER — LIDOCAINE HCL 1 % IJ SOLN
INTRAMUSCULAR | Status: AC | PRN
  Administered 2017-02-22: 5 mL

## 2017-02-22 MED ORDER — LIDOCAINE HCL 1 % IJ SOLN
INTRAMUSCULAR | Status: DC
Start: 2017-02-22 — End: 2017-02-23
  Filled 2017-02-22: qty 20

## 2017-02-22 MED ORDER — ALTEPLASE 2 MG IJ SOLR
INTRAMUSCULAR | Status: AC
  Administered 2017-02-22: 2 mg
  Filled 2017-02-22: qty 2

## 2017-02-22 MED ORDER — HEPARIN SODIUM (PORCINE) 1000 UNIT/ML IJ SOLN
INTRAMUSCULAR | Status: AC
  Administered 2017-02-22: 3000 [IU]
  Filled 2017-02-22: qty 1

## 2017-02-22 MED ORDER — FENTANYL CITRATE (PF) 100 MCG/2ML IJ SOLN
INTRAMUSCULAR | Status: AC
  Filled 2017-02-22: qty 4

## 2017-02-22 MED ORDER — SODIUM CHLORIDE 0.9 % IV SOLN: INTRAVENOUS | Status: DC

## 2017-02-22 MED ORDER — MIDAZOLAM HCL 2 MG/2ML IJ SOLN
INTRAMUSCULAR | Status: AC
  Filled 2017-02-22: qty 6

## 2017-02-22 NOTE — H&P (Signed)
Chief Complaint: Patient was seen in consultation today for left leg dialysis graft thrombolysis at the request of Dunham,Cynthia  Referring Physician(s): Dunham,Cynthia  Supervising Physician: Richarda OverlieHenn, Clara Herbison  [facility]  History of Present Illness: Shane MinaReginald D Dogan is a 51 y.o. male   New left thigh dialysis graft placed in OR with Dr Edilia Boickson 01/15/2017 First use 02/16/17 Ran well Tue and Thurs----Sat 5/19- clotted  Has existing functional right thigh dialysis catheter in place (just exchanged in IR 02/03/17) Used this and finished dialysis Saturday. Scheduled for thrombolysis today with possible angioplasty/stent placement   Past Medical History:  Diagnosis Date  . Abscess of right arm 06/17/2015  . Anemia   . Anxiety   . Arteriovenous fistula infection (HCC)   . Cardiomegaly   . Chronic anticoagulation   . Chronic pancreatitis (HCC)   . DVT (deep venous thrombosis) (HCC) 2011  . ESRD (end stage renal disease) (HCC)    [facility] [place]  . GERD (gastroesophageal reflux disease)   . Gout   . Headache    migraines  . History of kidney stones   . Hypertension   . Kidney stones   . LOC (loss of consciousness) (HCC)   . Morbid obesity (HCC)   . OSA (obstructive sleep apnea)    wears CPAP  . Pneumonia   . Secondary hyperparathyroidism (HCC)   . Seizures (HCC)    pt denies  . Septic shock(785.52)   . Shortness of breath dyspnea    with exertion  . Type 2 diabetes mellitus, uncontrolled (HCC)    Type 2    Past Surgical History:  Procedure Laterality Date  . AMPUTATION FINGER / THUMB Right   . ANGIOPLASTY ILLIAC ARTERY Right 05/15/2016   Procedure: FIBRIN SHEATH ANGIOPLASTY;  Surgeon: Larina Earthlyodd F Early, MD;  [facility];  Service: Vascular;  Laterality: Right;  . APPLICATION OF WOUND VAC Left 06/20/2015   Procedure: APPLICATION OF WOUND VAC;  Surgeon: Renford DillsGregory G Schnier, MD;  Location: [facility] ORS;  Service: Vascular;  Laterality: Left;  .  APPLICATION OF WOUND VAC Left 01/15/2017   AV GRAFT  . AV FISTULA PLACEMENT    . AV FISTULA PLACEMENT Left 06/20/2015   Procedure: ARTERIOVENOUS (AV) FISTULA  ligation , excision;  Surgeon: Renford DillsGregory G Schnier, MD;  Location: [facility] ORS;  Service: Vascular;  Laterality: Left;  . AV FISTULA PLACEMENT Left 12/04/2016   Procedure: EXPLORATION LEFT UPPER ARM;  Surgeon: Chuck Hinthristopher S Dickson, MD;  [facility];  Service: Vascular;  Laterality: Left;  . AV FISTULA PLACEMENT Left 01/15/2017   Procedure: INSERTION OF ARTERIOVENOUS (AV) GORE-TEX STRETCH GRAFT THIGH;  Surgeon: Chuck Hinthristopher S Dickson, MD;  [facility];  Service: Vascular;  Laterality: Left;  . EXCHANGE OF A DIALYSIS CATHETER Left 11/13/2015   Procedure: EXCHANGE OF A DIALYSIS CATHETER- LEFT INTERNAL JUGULAR;  Surgeon: Nada LibmanVance W Brabham, MD;  [facility];  Service: Vascular;  Laterality: Left;  . EXCHANGE OF A DIALYSIS CATHETER Right 05/15/2016   Procedure: EXCHANGE OF A RIGHT FEMORAL DIALYSIS CATHETER;  Surgeon: Larina Earthlyodd F Early, MD;  [facility];  Service: Vascular;  Laterality: Right;  . I&D EXTREMITY Left 06/20/2015   Procedure: IRRIGATION AND DEBRIDEMENT EXTREMITY;  Surgeon: Renford DillsGregory G Schnier, MD;  Location: [facility] ORS;  Service: Vascular;  Laterality: Left;  . INSERTION OF DIALYSIS CATHETER N/A 06/25/2015   Procedure: place a tunnelled catheter for long term use;  Surgeon: Annice NeedyJason S Dew, MD;  [facility];  Service: Vascular;  Laterality: N/A;  . INSERTION OF DIALYSIS CATHETER Left 07/25/2015   Procedure: INSERTION OF LEFT INTERNAL JUGULAR DIALYSIS CATHETER;  Surgeon: Pryor Ochoa, MD;  Location[facility] OR;  Service: Vascular;  Laterality: Left;  . INSERTION OF DIALYSIS CATHETER Left 08/01/2015   Procedure: REMOVAL OF DIALYSIS CATHETER; PLACEMNET OF NEW DIALYSIS CATHETER;  Surgeon: Pryor Ochoa, MD;  Location: [facility] OR;  Service: Vascular;  Laterality: Left;  . INSERTION OF DIALYSIS CATHETER Right 03/31/2016   Procedure: INSERTION OF  DIALYSIS CATHETER RIGHT FEMORAL ;  Surgeon: Chuck Hint, MD;  Location: [facility] OR;  Service: Vascular;  Laterality: Right;  . IR FLUORO GUIDE CV LINE RIGHT  01/20/2017  . IR FLUORO GUIDE CV LINE RIGHT  01/27/2017  . IR FLUORO GUIDE CV LINE RIGHT  02/03/2017  . IR GENERIC HISTORICAL  05/28/2016   IR FLUORO GUIDE CV LINE RIGHT 05/28/2016 Berdine Dance, MD [facility]-INTER[facility] RAD  . IR GENERIC HISTORICAL  06/11/2016   IR FLUORO GUIDE CV LINE RIGHT 06/11/2016 [facility]  . IR VENOCAVAGRAM IVC  02/03/2017  . PERIPHERAL VASCULAR CATHETERIZATION N/A 06/25/2015   Procedure: Dialysis/Perma Catheter Insertion;  Surgeon: Annice Needy, MD;  Location: [facility] INVASIVE CV LAB;  Service: Cardiovascular;  Laterality: N/A;  . PERIPHERAL VASCULAR CATHETERIZATION Bilateral 09/13/2015   Procedure: Upper Extremity Venography;  Surgeon: Sherren Kerns, MD;  Location: [facility] INVASIVE CV LAB;  Service: Cardiovascular;  Laterality: Bilateral;    Allergies: Aspirin  Medications: Prior to Admission medications   Medication Sig Start Date End Date Taking? Authorizing Provider  allopurinol (ZYLOPRIM) 300 MG tablet Take 600 mg by mouth daily.     [provider]  calcium acetate (PHOSLO) 667 MG capsule Take 2,001 mg by mouth 3 (three) times daily with meals. Also with snacks    [provider]  cinacalcet (SENSIPAR) 60 MG tablet Take 60 mg by mouth See admin instructions. Take 60 mg by mouth on Tuesday and Saturday    [provider]  clonazePAM (KLONOPIN) 1 MG tablet Take 1 tablet (1 mg total) by mouth 2 (two) times daily as needed for anxiety. Patient taking differently: Take 2 mg by mouth every evening.  12/04/16   Raymond Gurney, PA-C  colchicine 0.6 MG tablet Take 1.2 mg by mouth daily.     [provider]  gabapentin (NEURONTIN) 300 MG capsule Take 1 capsule (300 mg total) by mouth 3 (three) times daily as needed (pain). 12/04/16   Raymond Gurney, PA-C  ibuprofen (ADVIL,MOTRIN) 800 MG tablet  Take 800 mg by mouth every 8 (eight) hours as needed for moderate pain.    [provider]  LEVEMIR FLEXTOUCH 100 UNIT/ML Pen INJECT 70 UNITS UNDER THE SKIN QHS 08/06/16   [provider]  lidocaine (XYLOCAINE) 5 % ointment Apply to feet twice daily as needed.  Use with gloves/cotton swab 09/09/16   Nita Sickle K, DO  multivitamin (RENA-VIT) TABS tablet Take 1 tablet by mouth daily.  12/05/14   [provider]  nortriptyline (PAMELOR) 10 MG capsule Take 1 capsule (10 mg total) by mouth at bedtime. 09/09/16   Patel, Donika K, DO  NOVOLOG FLEXPEN 100 UNIT/ML FlexPen Inject 35 Units as directed 3 (three) times daily with meals. 04/20/16   [provider]  omeprazole (PRILOSEC) 40 MG capsule Take 40 mg by mouth daily 01/06/17   [provider]  Tetrahydrozoline HCl (VISINE OP) Apply 1 drop to eye daily as needed (dry  eyes).    [provider]  traMADol (ULTRAM) 50 MG tablet Take 1 tablet (50 mg total) by mouth every 6 (six) hours as needed for moderate pain. 01/15/17   Rhyne, Ames Coupe, PA-C  warfarin (COUMADIN) 5 MG tablet Take 5 mg by mouth daily 10/30/16   [provider]     Family History  Problem Relation Age of Onset  . Diabetes Mother   . Hypertension Mother   . Diabetes Father   . Hypertension Father   . Hypertension Brother     Social History   Social History  . Marital status: Single    Spouse name: N/A  . Number of children: N/A  . Years of education: N/A   Occupational History  . disabled     Social History Main Topics  . Smoking status: Never Smoker  . Smokeless tobacco: Never Used  . Alcohol use No  . Drug use: No  . Sexual activity: Not Asked   Other Topics Concern  . None   Social History Narrative   Lives with a friend in a one story home.  Has one child.  On disability.  Education: some college.     Review of Systems: A 12 point ROS discussed and pertinent positives are indicated in the HPI above.   All other systems are negative.  Review of Systems  Constitutional: Positive for fatigue. Negative for fever.  Respiratory: Negative for shortness of breath.   Cardiovascular: Negative for chest pain.  Musculoskeletal: Positive for gait problem.  Psychiatric/Behavioral: Negative for confusion and decreased concentration.    Vital Signs: BP (!) 167/123   Pulse (!) 110   Temp 97.6 F (36.4 C)   Resp 20   Ht 5\' 11"  (1.803 m)   Wt (!) 392 lb 6.7 oz (178 kg)   SpO2 96%   BMI 54.73 kg/m   Physical Exam  Constitutional: He is oriented to person, place, and time.  Cardiovascular: Normal rate and regular rhythm.   Pulmonary/Chest: Effort normal and breath sounds normal.  Abdominal: Soft. Bowel sounds are normal.  Musculoskeletal: Normal range of motion.  Right hip pain Left thigh dialysis graft- no pulse or thrill  Neurological: He is alert and oriented to person, place, and time.  Skin: Skin is warm and dry.  Psychiatric: He has a normal mood and affect. His behavior is normal. Judgment normal.  Nursing note and vitals reviewed.   Mallampati Score:  MD Evaluation Airway: WNL Heart: WNL Abdomen: WNL Chest/ Lungs: WNL ASA  Classification: 3 Mallampati/Airway Score: One  Imaging: Ir Jeoffrey Massed  Result Date: 02/03/2017 INDICATION: END-STAGE RENAL DISEASE, POORLY FUNCTIONING RIGHT FEMORAL HD CATHETER EXAM: UPPER IVC VENOGRAM THROUGH THE EXISTING CATHETER EXCHANGE OF THE TUNNELED FEMORAL DIALYSIS CATHETER MEDICATIONS: ANCEF 2 G; The antibiotic was administered within an appropriate time interval prior to skin puncture. ANESTHESIA/SEDATION: None. FLUOROSCOPY TIME:  Fluoroscopy Time:  42 seconds (130 mGy). COMPLICATIONS: None immediate. PROCEDURE: Informed written consent was obtained from the patient after a thorough discussion of the procedural risks, benefits and alternatives. All questions were addressed. Maximal Sterile Barrier Technique was utilized including caps, mask,  sterile gowns, sterile gloves, sterile drape, hand hygiene and skin antiseptic. A timeout was performed prior to the initiation of the procedure. Under sterile conditions, the existing right femoral tunneled catheter was injected with contrast centered at the upper IVC. Subtraction imaging performed. Injection performed through both red and blue lumens. Catheter tips are in the IVC above the renal veins. Upper  IVC and hepatic portion of the IVC are patent. IVC / right atrial junction is patent. Mixing artifact present within the IVC related to the injection. No occlusion or significant stenosis. Under sterile conditions and local anesthesia, the catheter was exchanged over a Bentson guidewire. The catheter tips were positioned more centrally at the IVC right atrial junction. Blood aspirated easily from both lumens followed by saline and heparin. Appropriate volume and strength of heparin instilled in both lumens. External caps applied. Catheter secured with Prolene sutures. Sterile dressing applied. No immediate complication. Patient tolerated the procedure well. IMPRESSION: Catheter injection confirms patency of the IVC above the renal veins to the right atrium. Successful exchange of the tunneled right femoral HD catheter, tips positioned more centrally at the IVC right atrial junction. Electronically Signed   By: Judie Petit.  Shick M.D.   On: 02/03/2017 11:19   Ir Fluoro Guide Cv Line Right  Result Date: 02/03/2017 INDICATION: END-STAGE RENAL DISEASE, POORLY FUNCTIONING RIGHT FEMORAL HD CATHETER EXAM: UPPER IVC VENOGRAM THROUGH THE EXISTING CATHETER EXCHANGE OF THE TUNNELED FEMORAL DIALYSIS CATHETER MEDICATIONS: ANCEF 2 G; The antibiotic was administered within an appropriate time interval prior to skin puncture. ANESTHESIA/SEDATION: None. FLUOROSCOPY TIME:  Fluoroscopy Time:  42 seconds (130 mGy). COMPLICATIONS: None immediate. PROCEDURE: Informed written consent was obtained from the patient after a thorough  discussion of the procedural risks, benefits and alternatives. All questions were addressed. Maximal Sterile Barrier Technique was utilized including caps, mask, sterile gowns, sterile gloves, sterile drape, hand hygiene and skin antiseptic. A timeout was performed prior to the initiation of the procedure. Under sterile conditions, the existing right femoral tunneled catheter was injected with contrast centered at the upper IVC. Subtraction imaging performed. Injection performed through both red and blue lumens. Catheter tips are in the IVC above the renal veins. Upper IVC and hepatic portion of the IVC are patent. IVC / right atrial junction is patent. Mixing artifact present within the IVC related to the injection. No occlusion or significant stenosis. Under sterile conditions and local anesthesia, the catheter was exchanged over a Bentson guidewire. The catheter tips were positioned more centrally at the IVC right atrial junction. Blood aspirated easily from both lumens followed by saline and heparin. Appropriate volume and strength of heparin instilled in both lumens. External caps applied. Catheter secured with Prolene sutures. Sterile dressing applied. No immediate complication. Patient tolerated the procedure well. IMPRESSION: Catheter injection confirms patency of the IVC above the renal veins to the right atrium. Successful exchange of the tunneled right femoral HD catheter, tips positioned more centrally at the IVC right atrial junction. Electronically Signed   By: Judie Petit.  Shick M.D.   On: 02/03/2017 11:19   Ir Fluoro Guide Cv Line Right  Result Date: 01/27/2017 INDICATION: History of end-stage renal disease, now with poorly functioning right femoral approach dialysis catheter. Please perform fluoroscopic guided exchange. EXAM: TUNNELED CENTRAL VENOUS HEMODIALYSIS CATHETER REPLACEMENT WITH FLUOROSCOPIC GUIDANCE MEDICATIONS: None FLUOROSCOPY TIME:  1 minute (51.5 mGy) COMPLICATIONS: None immediate.  PROCEDURE: Informed written consent was obtained from the patient after a discussion of the risks, benefits, and alternatives to treatment. Questions regarding the procedure were encouraged and answered. The skin and external portion of the existing hemodialysis catheter was prepped with chlorhexidine in a sterile fashion, and a sterile drape was applied covering the operative field. Maximum barrier sterile technique with sterile gowns and gloves were used for the procedure. A timeout was performed prior to the initiation of the procedure. Both lumens of the hemodialysis  catheter were cannulated with a stiff Glidewire and advanced to the level of the right atrium. Under intermittent fluoroscopic guidance, the existing dialysis catheter was exchanged for a new 55 cm tip to cuff Hemosplit dialysis catheter with tips ultimately terminating within the inferior aspect of the right atrium. Final catheter positioning was confirmed and documented with a spot radiographic image. The catheter aspirates and flushes normally. The catheter was flushed with appropriate volume heparin dwells. The catheter exit site was secured with a 0-Prolene retention sutures. Both lumens were heparinized. A dressing was placed. The patient tolerated the procedure well without immediate post procedural complication. IMPRESSION: Successful replacement of 55 cm tip to cuff tunneled hemodialysis catheter with tips terminating within the inferior aspect of the right atrium. The catheter is ready for immediate use. Electronically Signed   By: Simonne Come M.D.   On: 01/27/2017 15:55    Labs:  CBC:  Recent Labs  03/26/16 1235 03/27/16 0315 03/29/16 0444  03/31/16 1600 05/15/16 0943 12/04/16 0949 01/15/17 0728  WBC 17.2* 13.5* 10.5  --  12.1*  --   --   --   HGB 10.5* 9.5* 8.7*  < > 8.7* 13.3 11.2* 12.6*  HCT 34.3* 32.0* 28.7*  < > 28.4* 39.0 33.0* 37.0*  PLT 226 205 193  --  254  --   --   --   < > = values in this interval not  displayed.  COAGS:  Recent Labs  03/27/16 0032 03/29/16 0444 05/15/16 0821 12/04/16 0947 01/15/17 0724  INR 3.08* 1.44 1.37 1.66 1.08  APTT 63*  --  34 >200* 35    BMP:  Recent Labs  03/29/16 0444 03/30/16 0906  03/31/16 1615 05/15/16 0846 05/15/16 0943 12/04/16 0949 01/15/17 0728  NA 132* 133*  < > 133* 134* 138 137 135  K 3.7 3.7  < > 4.3 5.8* 6.2* 3.8 3.9  CL 96* 94*  --  95* 99*  --   --   --   CO2 26 24  --  25 23  --   --   --   GLUCOSE 189* 209*  < > 148* 176* 177* 126* 152*  BUN 27* 50*  --  71* 64*  --   --   --   CALCIUM 8.2* 8.1*  --  8.2* 8.9  --   --   --   CREATININE 6.82* 9.94*  --  13.10* 11.50*  --   --   --   GFRNONAA 8* 5*  --  4* 5*  --   --   --   GFRAA 10* 6*  --  4* 5*  --   --   --   < > = values in this interval not displayed.  LIVER FUNCTION TESTS:  Recent Labs  03/04/16 1541 03/26/16 1235 03/28/16 0546 03/29/16 0444 03/30/16 0906 03/31/16 1615  BILITOT 1.0 0.9  --   --   --   --   AST 42* 27  --   --   --   --   ALT 46 24  --   --   --   --   ALKPHOS 157* 138*  --   --   --   --   PROT 7.7 8.4*  --   --   --   --   ALBUMIN 3.3* 3.7 2.9* 2.8* 2.7* 2.7*    TUMOR MARKERS: No results for input(s): AFPTM, CEA, CA199, CHROMGRNA in the last 8760 hours.  Assessment and Plan:  Left thigh dialysis graft clotted Scheduled for thrombolysis with possible angioplasty/stent placement Has existing Rt thigh dialysis catheter ---functioning well Risks and Benefits discussed with the patient including, but not limited to bleeding, infection, vascular injury, pulmonary embolism, need for tunneled HD catheter placement or even death. All of the patient's questions were answered, patient is agreeable to proceed. Consent signed and in chart.   Thank you for this interesting consult.  I greatly enjoyed meeting VANDEN FAWAZ and look forward to participating in their care.  A copy of this report was sent to the requesting provider on this  date.  Electronically Signed: Robet Leu, PA-C 02/22/2017, 1:50 PM   I spent a total of 30 min    in face to face in clinical consultation, greater than 50% of which was counseling/coordinating care for right thigh dialysis graft thombolysis

## 2017-02-22 NOTE — Sedation Documentation (Signed)
D/c to home with friend.  Tolerated procedure well though unsuccessful.  Has HD catheter in place right groin.

## 2017-02-22 NOTE — Telephone Encounter (Signed)
[facility] at [facility] called to report that Shane Hamilton's Left thigh AVG has clotted (clotted as of Saturday 5-19). Patient still has his functioning TDC which was placed by IR. This Thigh AVG was placed on 01-15-17 by Dr. Edilia Boickson and was cannulated initially on 02-16-17 per Angie. [facility] will contact IR to see if they can declot this Thigh AVG. They will contact us if this should be unsuccessful.

## 2017-02-22 NOTE — Sedation Documentation (Signed)
No sedation given for procedure.  Declot unsuccessful.  Tolerated well.

## 2017-02-22 NOTE — Procedures (Signed)
Attempted declot of left thigh graft.  Able to restore partial flow thru graft but unable to clear venous end of graft.   Eventually, the arterial end of the graft thrombosed again and the declot procedure was stopped.  Minimal blood loss.  No immediate complication.  Continue to use right thigh catheter for dialysis.

## 2017-02-23 ENCOUNTER — Other Ambulatory Visit: Payer: Self-pay | Admitting: *Deleted

## 2017-02-23 DIAGNOSIS — N186 End stage renal disease: Secondary | ICD-10-CM | POA: Diagnosis not present

## 2017-02-23 DIAGNOSIS — D631 Anemia in chronic kidney disease: Secondary | ICD-10-CM | POA: Diagnosis not present

## 2017-02-23 DIAGNOSIS — E1129 Type 2 diabetes mellitus with other diabetic kidney complication: Secondary | ICD-10-CM | POA: Diagnosis not present

## 2017-02-23 DIAGNOSIS — N2581 Secondary hyperparathyroidism of renal origin: Secondary | ICD-10-CM | POA: Diagnosis not present

## 2017-02-23 NOTE — Telephone Encounter (Signed)
Angie called me today and said IR was not able to declot his left thigh AVG yesterday. Per Dr. Arbie CookeyEarly, we will schedule him for thrombectomy / revision of the AVG asap. Per Angie, patient has been taking his Coumadin 5 mg without the [facility] knowledge so he hasn't had any INRs for monitoring. She will place an order for them to draw INR on Thursday 02-25-17 at HD session. He will have to be off Coumadin for 5 days prior to surgery. Patient's right Thigh catheter is working well per [employer].

## 2017-02-25 DIAGNOSIS — N2581 Secondary hyperparathyroidism of renal origin: Secondary | ICD-10-CM | POA: Diagnosis not present

## 2017-02-25 DIAGNOSIS — E1129 Type 2 diabetes mellitus with other diabetic kidney complication: Secondary | ICD-10-CM | POA: Diagnosis not present

## 2017-02-25 DIAGNOSIS — D631 Anemia in chronic kidney disease: Secondary | ICD-10-CM | POA: Diagnosis not present

## 2017-02-25 DIAGNOSIS — N186 End stage renal disease: Secondary | ICD-10-CM | POA: Diagnosis not present

## 2017-02-27 DIAGNOSIS — E1129 Type 2 diabetes mellitus with other diabetic kidney complication: Secondary | ICD-10-CM | POA: Diagnosis not present

## 2017-02-27 DIAGNOSIS — N186 End stage renal disease: Secondary | ICD-10-CM | POA: Diagnosis not present

## 2017-02-27 DIAGNOSIS — N2581 Secondary hyperparathyroidism of renal origin: Secondary | ICD-10-CM | POA: Diagnosis not present

## 2017-02-27 DIAGNOSIS — D631 Anemia in chronic kidney disease: Secondary | ICD-10-CM | POA: Diagnosis not present

## 2017-03-02 DIAGNOSIS — M75112 Incomplete rotator cuff tear or rupture of left shoulder, not specified as traumatic: Secondary | ICD-10-CM | POA: Diagnosis not present

## 2017-03-02 DIAGNOSIS — N186 End stage renal disease: Secondary | ICD-10-CM | POA: Diagnosis not present

## 2017-03-02 DIAGNOSIS — E1129 Type 2 diabetes mellitus with other diabetic kidney complication: Secondary | ICD-10-CM | POA: Diagnosis not present

## 2017-03-02 DIAGNOSIS — N2581 Secondary hyperparathyroidism of renal origin: Secondary | ICD-10-CM | POA: Diagnosis not present

## 2017-03-02 DIAGNOSIS — D631 Anemia in chronic kidney disease: Secondary | ICD-10-CM | POA: Diagnosis not present

## 2017-03-02 DIAGNOSIS — S7001XA Contusion of right hip, initial encounter: Secondary | ICD-10-CM | POA: Diagnosis not present

## 2017-03-04 ENCOUNTER — Encounter (HOSPITAL_COMMUNITY): Payer: Self-pay | Admitting: *Deleted

## 2017-03-04 DIAGNOSIS — N2581 Secondary hyperparathyroidism of renal origin: Secondary | ICD-10-CM | POA: Diagnosis not present

## 2017-03-04 DIAGNOSIS — N186 End stage renal disease: Secondary | ICD-10-CM | POA: Diagnosis not present

## 2017-03-04 DIAGNOSIS — D631 Anemia in chronic kidney disease: Secondary | ICD-10-CM | POA: Diagnosis not present

## 2017-03-04 DIAGNOSIS — E1129 Type 2 diabetes mellitus with other diabetic kidney complication: Secondary | ICD-10-CM | POA: Diagnosis not present

## 2017-03-04 DIAGNOSIS — Z992 Dependence on renal dialysis: Secondary | ICD-10-CM | POA: Diagnosis not present

## 2017-03-04 NOTE — Progress Notes (Signed)
Pt denies SOB, chest pain, and being under the care of a cardiologist. Pt denies having a stress test, and cardiac cath. Pt made aware to stop taking vitamins, fish oil, and herbal medications. Do not take any NSAIDs ie: Ibuprofen, Advil, Naproxen BC and Goody Powder. Pt stated that last dose of Coumadin was  02/27/17. Pt stated that MD advised that he takes only a half dose of Levemir  Insulin the night before surgery ( 35 units), No Novolog insulin DOS . Pt made aware of diabetes protocol to check BG every 2 hours piror to arrival to hospital, treat BG< 70 with 4 glucose tabs or glucose gel, or 4 ounces of apple of cranberry juice, wait 15 minutes after intervention and recheck BG, if BG remains< 70 call the SS unit. Pt verbalized understanding of all pre-op instructions.

## 2017-03-05 ENCOUNTER — Ambulatory Visit (HOSPITAL_COMMUNITY): Payer: Medicare Other | Admitting: Certified Registered Nurse Anesthetist

## 2017-03-05 ENCOUNTER — Telehealth: Payer: Self-pay | Admitting: *Deleted

## 2017-03-05 ENCOUNTER — Ambulatory Visit (HOSPITAL_COMMUNITY)
Admission: RE | Admit: 2017-03-05 | Discharge: 2017-03-05 | Disposition: A | Discharge status: Home / Self Care | Payer: Medicare Other | Source: Ambulatory Visit | Attending: Vascular Surgery | Admitting: Vascular Surgery

## 2017-03-05 ENCOUNTER — Encounter (HOSPITAL_COMMUNITY): Payer: Self-pay | Admitting: *Deleted

## 2017-03-05 ENCOUNTER — Encounter (HOSPITAL_COMMUNITY)
Admission: RE | Disposition: A | Discharge status: Home / Self Care | Payer: Self-pay | Source: Ambulatory Visit | Attending: Vascular Surgery

## 2017-03-05 DIAGNOSIS — Y832 Surgical operation with anastomosis, bypass or graft as the cause of abnormal reaction of the patient, or of later complication, without mention of misadventure at the time of the procedure: Secondary | ICD-10-CM | POA: Diagnosis not present

## 2017-03-05 DIAGNOSIS — Z7901 Long term (current) use of anticoagulants: Secondary | ICD-10-CM | POA: Diagnosis not present

## 2017-03-05 DIAGNOSIS — N186 End stage renal disease: Secondary | ICD-10-CM | POA: Insufficient documentation

## 2017-03-05 DIAGNOSIS — K219 Gastro-esophageal reflux disease without esophagitis: Secondary | ICD-10-CM | POA: Insufficient documentation

## 2017-03-05 DIAGNOSIS — F419 Anxiety disorder, unspecified: Secondary | ICD-10-CM | POA: Insufficient documentation

## 2017-03-05 DIAGNOSIS — E1122 Type 2 diabetes mellitus with diabetic chronic kidney disease: Secondary | ICD-10-CM | POA: Insufficient documentation

## 2017-03-05 DIAGNOSIS — E119 Type 2 diabetes mellitus without complications: Secondary | ICD-10-CM | POA: Diagnosis not present

## 2017-03-05 DIAGNOSIS — Z6841 Body Mass Index (BMI) 40.0 and over, adult: Secondary | ICD-10-CM | POA: Insufficient documentation

## 2017-03-05 DIAGNOSIS — Z992 Dependence on renal dialysis: Secondary | ICD-10-CM | POA: Insufficient documentation

## 2017-03-05 DIAGNOSIS — Z79899 Other long term (current) drug therapy: Secondary | ICD-10-CM | POA: Diagnosis not present

## 2017-03-05 DIAGNOSIS — G4730 Sleep apnea, unspecified: Secondary | ICD-10-CM | POA: Diagnosis not present

## 2017-03-05 DIAGNOSIS — Z86711 Personal history of pulmonary embolism: Secondary | ICD-10-CM | POA: Insufficient documentation

## 2017-03-05 DIAGNOSIS — T82868A Thrombosis of vascular prosthetic devices, implants and grafts, initial encounter: Secondary | ICD-10-CM | POA: Insufficient documentation

## 2017-03-05 DIAGNOSIS — Z794 Long term (current) use of insulin: Secondary | ICD-10-CM | POA: Diagnosis not present

## 2017-03-05 DIAGNOSIS — I120 Hypertensive chronic kidney disease with stage 5 chronic kidney disease or end stage renal disease: Secondary | ICD-10-CM | POA: Insufficient documentation

## 2017-03-05 HISTORY — PX: THROMBECTOMY AND REVISION OF ARTERIOVENTOUS (AV) GORETEX  GRAFT: SHX6120

## 2017-03-05 LAB — GLUCOSE, CAPILLARY: GLUCOSE-CAPILLARY: 122 mg/dL — AB (ref 65–99)

## 2017-03-05 LAB — POCT I-STAT 4, (NA,K, GLUC, HGB,HCT)
Glucose, Bld: 129 mg/dL — ABNORMAL HIGH (ref 65–99)
HCT: 37 % — ABNORMAL LOW (ref 39.0–52.0)
Hemoglobin: 12.6 g/dL — ABNORMAL LOW (ref 13.0–17.0)
POTASSIUM: 3.9 mmol/L (ref 3.5–5.1)
Sodium: 139 mmol/L (ref 135–145)

## 2017-03-05 LAB — APTT: APTT: 33 s (ref 24–36)

## 2017-03-05 LAB — PROTIME-INR
INR: 1.39
PROTHROMBIN TIME: 17.1 s — AB (ref 11.4–15.2)

## 2017-03-05 SURGERY — THROMBECTOMY AND REVISION OF ARTERIOVENTOUS (AV) GORETEX  GRAFT
Anesthesia: General | Laterality: Left

## 2017-03-05 MED ORDER — PROPOFOL 10 MG/ML IV BOLUS
INTRAVENOUS | Status: AC
  Filled 2017-03-05: qty 20

## 2017-03-05 MED ORDER — PROTAMINE SULFATE 10 MG/ML IV SOLN
INTRAVENOUS | Status: DC | PRN
  Administered 2017-03-05: 50 mg via INTRAVENOUS

## 2017-03-05 MED ORDER — THROMBIN 20000 UNITS EX SOLR
CUTANEOUS | Status: AC
  Filled 2017-03-05: qty 20000

## 2017-03-05 MED ORDER — OXYCODONE-ACETAMINOPHEN 5-325 MG PO TABS: 1.0000 | ORAL_TABLET | ORAL | 0 refills | Status: AC | PRN

## 2017-03-05 MED ORDER — LIDOCAINE HCL (CARDIAC) 20 MG/ML IV SOLN
INTRAVENOUS | Status: DC | PRN
  Administered 2017-03-05: 60 mg via INTRAVENOUS

## 2017-03-05 MED ORDER — CHLORHEXIDINE GLUCONATE 4 % EX LIQD: 60.0000 mL | Freq: Once | CUTANEOUS | Status: DC

## 2017-03-05 MED ORDER — SUCCINYLCHOLINE CHLORIDE 200 MG/10ML IV SOSY
PREFILLED_SYRINGE | INTRAVENOUS | Status: DC | PRN
  Administered 2017-03-05: 120 mg via INTRAVENOUS

## 2017-03-05 MED ORDER — 0.9 % SODIUM CHLORIDE (POUR BTL) OPTIME
TOPICAL | Status: DC | PRN
  Administered 2017-03-05: 1000 mL

## 2017-03-05 MED ORDER — HEPARIN SODIUM (PORCINE) 1000 UNIT/ML IJ SOLN
INTRAMUSCULAR | Status: DC | PRN
  Administered 2017-03-05: 9000 [IU] via INTRAVENOUS

## 2017-03-05 MED ORDER — ROCURONIUM BROMIDE 100 MG/10ML IV SOLN
INTRAVENOUS | Status: DC | PRN
  Administered 2017-03-05: 50 mg via INTRAVENOUS

## 2017-03-05 MED ORDER — DEXTROSE 5 % IV SOLN
1.5000 g | INTRAVENOUS | Status: AC
  Administered 2017-03-05: 1.5 g via INTRAVENOUS
  Filled 2017-03-05: qty 1.5

## 2017-03-05 MED ORDER — DEXTROSE 5 % IV SOLN
INTRAVENOUS | Status: DC | PRN
  Administered 2017-03-05: 25 ug/min via INTRAVENOUS

## 2017-03-05 MED ORDER — HEPARIN SODIUM (PORCINE) 5000 UNIT/ML IJ SOLN
INTRAMUSCULAR | Status: DC | PRN
  Administered 2017-03-05: 500 mL

## 2017-03-05 MED ORDER — FENTANYL CITRATE (PF) 250 MCG/5ML IJ SOLN
INTRAMUSCULAR | Status: AC
  Filled 2017-03-05: qty 5

## 2017-03-05 MED ORDER — NEOSTIGMINE METHYLSULFATE 10 MG/10ML IV SOLN
INTRAVENOUS | Status: DC | PRN
  Administered 2017-03-05: 5 mg via INTRAVENOUS

## 2017-03-05 MED ORDER — MIDAZOLAM HCL 2 MG/2ML IJ SOLN
INTRAMUSCULAR | Status: AC
  Filled 2017-03-05: qty 2

## 2017-03-05 MED ORDER — FENTANYL CITRATE (PF) 100 MCG/2ML IJ SOLN
INTRAMUSCULAR | Status: DC | PRN
  Administered 2017-03-05 (×2): 50 ug via INTRAVENOUS

## 2017-03-05 MED ORDER — SODIUM CHLORIDE 0.9 % IV SOLN
INTRAVENOUS | Status: DC
  Administered 2017-03-05 (×2): via INTRAVENOUS

## 2017-03-05 MED ORDER — GLYCOPYRROLATE 0.2 MG/ML IJ SOLN
INTRAMUSCULAR | Status: DC | PRN
  Administered 2017-03-05: .8 mg via INTRAVENOUS

## 2017-03-05 MED ORDER — PROPOFOL 10 MG/ML IV BOLUS
INTRAVENOUS | Status: DC | PRN
  Administered 2017-03-05: 200 mg via INTRAVENOUS

## 2017-03-05 MED ORDER — ONDANSETRON HCL 4 MG/2ML IJ SOLN
INTRAMUSCULAR | Status: DC | PRN
  Administered 2017-03-05: 4 mg via INTRAVENOUS

## 2017-03-05 MED ORDER — MIDAZOLAM HCL 5 MG/5ML IJ SOLN
INTRAMUSCULAR | Status: DC | PRN
  Administered 2017-03-05 (×2): 1 mg via INTRAVENOUS

## 2017-03-05 MED ORDER — LIDOCAINE-EPINEPHRINE (PF) 1 %-1:200000 IJ SOLN
INTRAMUSCULAR | Status: AC
  Filled 2017-03-05: qty 30

## 2017-03-05 SURGICAL SUPPLY — 44 items
ARMBAND PINK RESTRICT EXTREMIT (MISCELLANEOUS) ×6 IMPLANT
CANISTER SUCT 3000ML PPV (MISCELLANEOUS) ×3 IMPLANT
CANNULA VESSEL 3MM 2 BLNT TIP (CANNULA) ×3 IMPLANT
CATH EMB 3FR 80CM (CATHETERS) ×3 IMPLANT
CATH EMB 4FR 80CM (CATHETERS) ×6 IMPLANT
CLIP TI MEDIUM 6 (CLIP) ×3 IMPLANT
CLIP TI WIDE RED SMALL 6 (CLIP) ×3 IMPLANT
DERMABOND ADVANCED (GAUZE/BANDAGES/DRESSINGS) ×2
DERMABOND ADVANCED .7 DNX12 (GAUZE/BANDAGES/DRESSINGS) ×1 IMPLANT
DRAPE X-RAY CASS 24X20 (DRAPES) IMPLANT
ELECT REM PT RETURN 9FT ADLT (ELECTROSURGICAL) ×3
ELECTRODE REM PT RTRN 9FT ADLT (ELECTROSURGICAL) ×1 IMPLANT
GAUZE SPONGE 4X4 16PLY XRAY LF (GAUZE/BANDAGES/DRESSINGS) IMPLANT
GLOVE BIO SURGEON STRL SZ7 (GLOVE) ×3 IMPLANT
GLOVE BIO SURGEON STRL SZ7.5 (GLOVE) ×3 IMPLANT
GLOVE BIOGEL PI IND STRL 6.5 (GLOVE) ×1 IMPLANT
GLOVE BIOGEL PI IND STRL 7.0 (GLOVE) ×1 IMPLANT
GLOVE BIOGEL PI IND STRL 8 (GLOVE) ×1 IMPLANT
GLOVE BIOGEL PI INDICATOR 6.5 (GLOVE) ×2
GLOVE BIOGEL PI INDICATOR 7.0 (GLOVE) ×2
GLOVE BIOGEL PI INDICATOR 8 (GLOVE) ×2
GOWN STRL REUS W/ TWL LRG LVL3 (GOWN DISPOSABLE) ×4 IMPLANT
GOWN STRL REUS W/TWL LRG LVL3 (GOWN DISPOSABLE) ×8
KIT BASIN OR (CUSTOM PROCEDURE TRAY) ×3 IMPLANT
KIT ROOM TURNOVER OR (KITS) ×3 IMPLANT
NS IRRIG 1000ML POUR BTL (IV SOLUTION) ×3 IMPLANT
PACK CV ACCESS (CUSTOM PROCEDURE TRAY) ×3 IMPLANT
PAD ARMBOARD 7.5X6 YLW CONV (MISCELLANEOUS) ×6 IMPLANT
SET COLLECT BLD 21X3/4 12 (NEEDLE) IMPLANT
SPONGE SURGIFOAM ABS GEL 100 (HEMOSTASIS) IMPLANT
STOPCOCK 4 WAY LG BORE MALE ST (IV SETS) IMPLANT
SUCTION FRAZIER HANDLE 10FR (MISCELLANEOUS) ×2
SUCTION TUBE FRAZIER 10FR DISP (MISCELLANEOUS) ×1 IMPLANT
SUT PROLENE 6 0 BV (SUTURE) ×12 IMPLANT
SUT VIC AB 3-0 SH 27 (SUTURE) ×4
SUT VIC AB 3-0 SH 27X BRD (SUTURE) ×2 IMPLANT
SUT VICRYL 4-0 PS2 18IN ABS (SUTURE) ×6 IMPLANT
SWAB COLLECTION DEVICE MRSA (MISCELLANEOUS) ×3 IMPLANT
SWAB CULTURE ESWAB REG 1ML (MISCELLANEOUS) ×3 IMPLANT
SYR 20CC LL (SYRINGE) ×3 IMPLANT
SYRINGE 1CC SLIP TB (MISCELLANEOUS) ×3 IMPLANT
TUBING EXTENTION W/L.L. (IV SETS) IMPLANT
UNDERPAD 30X30 (UNDERPADS AND DIAPERS) ×3 IMPLANT
WATER STERILE IRR 1000ML POUR (IV SOLUTION) ×3 IMPLANT

## 2017-03-05 NOTE — Interval H&P Note (Signed)
History and Physical Interval Note:  03/05/2017 12:43 PM  Shane Hamilton  has presented today for surgery, with the diagnosis of Complication of hemodialysis graft  The various methods of treatment have been discussed with the patient and family. After consideration of risks, benefits and other options for treatment, the patient has consented to  Procedure(s): THROMBECTOMY AND REVISION OF ARTERIOVENTOUS (AV) GORETEX  GRAFT LEFT THIGH (Left) as a surgical intervention .  The patient's history has been reviewed, patient examined, no change in status, stable for surgery.  I have reviewed the patient's chart and labs.  Questions were answered to the patient's satisfaction.     Waverly Ferrariickson, Christopher

## 2017-03-05 NOTE — Anesthesia Procedure Notes (Signed)
Procedure Name: Intubation Date/Time: 03/05/2017 1:36 PM Performed by: Valda Favia Pre-anesthesia Checklist: Patient identified, Emergency Drugs available, Suction available, Patient being monitored and Timeout performed Patient Re-evaluated:Patient Re-evaluated prior to inductionOxygen Delivery Method: Circle system utilized Preoxygenation: Pre-oxygenation with 100% oxygen Intubation Type: IV induction Ventilation: Two handed mask ventilation required Laryngoscope Size: Mac and 4 Grade View: Grade I Tube type: Oral Tube size: 7.5 mm Number of attempts: 1 Airway Equipment and Method: Stylet Placement Confirmation: ETT inserted through vocal cords under direct vision,  positive ETCO2 and breath sounds checked- equal and bilateral Secured at: 23 cm Tube secured with: Tape Dental Injury: Teeth and Oropharynx as per pre-operative assessment

## 2017-03-05 NOTE — H&P (View-Only) (Signed)
Patient name: Shane Hamilton Tarter MRN: [id number] DOB: 03/20/1966 Sex: male  REASON FOR VISIT: Postop  HPI: Shane Hamilton Aday is a 51 y.o. male who presents for follow-up status post left AV thigh graft. He did not have any options for upper extremity access given central venous and brachial vein occlusions. There was reluctance to placing a thigh graft given his morbid obesity. The patient has been very compliant with hygiene to his groin area. His incisions have healed. He is currently using a right femoral catheter for dialysis. He denies any pain or numbness with his left leg or foot.  Current Outpatient Prescriptions  Medication Sig Dispense Refill  . allopurinol (ZYLOPRIM) 300 MG tablet Take 600 mg by mouth daily.     . BD PEN NEEDLE NANO U/F 32G X 4 MM MISC     . calcium acetate (PHOSLO) 667 MG capsule Take 2,001-2,668 mg by mouth 3 (three) times daily with meals. Also with snacks    . cinacalcet (SENSIPAR) 60 MG tablet Take 60 mg by mouth 3 (three) times a week. Tuesdays, Thursdays, and Saturdays    . clonazePAM (KLONOPIN) 1 MG tablet Take 1 tablet (1 mg total) by mouth 2 (two) times daily as needed for anxiety.    . colchicine 0.6 MG tablet Take 1.2 mg by mouth daily.     Marland Kitchen. gabapentin (NEURONTIN) 300 MG capsule Take 1 capsule (300 mg total) by mouth 3 (three) times daily as needed (pain).    Marland Kitchen. ibuprofen (ADVIL,MOTRIN) 800 MG tablet Take 800 mg by mouth every 8 (eight) hours as needed for moderate pain.    Marland Kitchen. LEVEMIR FLEXTOUCH 100 UNIT/ML Pen INJECT 70 UNITS UNDER THE SKIN QHS  6  . lidocaine (XYLOCAINE) 5 % ointment Apply to feet twice daily as needed.  Use with gloves/cotton swab 50 g 3  . multivitamin (RENA-VIT) TABS tablet Take 1 tablet by mouth daily.   3  . nortriptyline (PAMELOR) 10 MG capsule Take 1 capsule (10 mg total) by mouth at bedtime. 90 capsule 3  . NOVOLOG FLEXPEN 100 UNIT/ML FlexPen Inject 35 Units as directed 3 (three) times daily with meals.    Marland Kitchen. omeprazole (PRILOSEC)  40 MG capsule TK 1 C PO QD  5  . Tetrahydrozoline HCl (VISINE OP) Apply 1 drop to eye daily as needed (dry eyes).    . traMADol (ULTRAM) 50 MG tablet Take 1 tablet (50 mg total) by mouth every 6 (six) hours as needed for moderate pain. 20 tablet 0  . warfarin (COUMADIN) 7.5 MG tablet TK 1 T PO  QOD  3  . omeprazole (PRILOSEC) 20 MG capsule Take 20 mg by mouth 2 (two) times daily before a meal.      No current facility-administered medications for this visit.     REVIEW OF SYSTEMS:  [X]  denotes positive finding, [ ]  denotes negative finding Cardiac  Comments:  Chest pain or chest pressure:    Shortness of breath upon exertion:    Short of breath when lying flat:    Irregular heart rhythm:    Constitutional    Fever or chills:      PHYSICAL EXAM: Vitals:   02/04/17 1448  BP: 123/73  Pulse: (!) 105  Resp: 18  Weight: (!) 395 lb 11.6 oz (179.5 kg)  Height: 5\' 11"  (1.803 m)    GENERAL: The patient is a well-nourished male, in no acute distress. The vital signs are documented above. PULMONARY: There is good air exchange bilaterally.  VASCULAR: Left groin and distal thigh incisions healed. No erythema. Faintly palpable thrill in AV thigh graft. Audible bruit throughout graft.  MEDICAL ISSUES: ESRD on HD s/p left AV thigh graft  The patient's left thigh incisions have healed. His thigh graft is patent. He denies any steal symptoms to his left foot and leg. His graft can be cannulated on 02/14/2017. After successful cannulation of this graft, his right femoral catheter can be removed. He will follow-up as needed.  Maris Berger, PA-C [facility] MD: Darrick Penna

## 2017-03-05 NOTE — Telephone Encounter (Signed)
[facility] [phone]) and told her these findings and recommendations per Dr. Edilia Boickson. She voiced understanding.

## 2017-03-05 NOTE — Anesthesia Preprocedure Evaluation (Addendum)
Anesthesia Evaluation  Patient identified by MRN, date of birth, ID band Patient awake    Reviewed: Allergy & Precautions, H&P , NPO status , Patient's Chart, lab work & pertinent test results  Airway Mallampati: III  TM Distance: >3 FB Neck ROM: Full    Dental no notable dental hx. (+) Teeth Intact, Dental Advisory Given   Pulmonary sleep apnea and Continuous Positive Airway Pressure Ventilation ,    Pulmonary exam normal breath sounds clear to auscultation       Cardiovascular hypertension, Pt. on medications  Rhythm:Regular Rate:Normal  ECG: ST, rate 127   Neuro/Psych  Headaches, Anxiety    GI/Hepatic Neg liver ROS, GERD  Medicated and Controlled,  Endo/Other  diabetes, Insulin DependentMorbid obesity  Renal/GU ESRF and DialysisRenal diseaseOn HD T, R, Sa  negative genitourinary   Musculoskeletal   Abdominal   Peds  Hematology  (+) anemia ,   Anesthesia Other Findings Super obese, BMI 54 gout  Reproductive/Obstetrics negative OB ROS                            Anesthesia Physical  Anesthesia Plan  ASA: IV  Anesthesia Plan: General   Post-op Pain Management:    Induction: Intravenous  Airway Management Planned: Oral ETT  Additional Equipment:   Intra-op Plan:   Post-operative Plan: Extubation in OR  Informed Consent: I have reviewed the patients History and Physical, chart, labs and discussed the procedure including the risks, benefits and alternatives for the proposed anesthesia with the patient or authorized representative who has indicated his/her understanding and acceptance.   Dental advisory given  Plan Discussed with: CRNA  Anesthesia Plan Comments:         Anesthesia Quick Evaluation

## 2017-03-05 NOTE — Op Note (Signed)
    NAME: Shane Hamilton    MRN: [id number] DOB: 05/03/1966    DATE OF OPERATION: 03/05/2017  PREOP DIAGNOSIS:    Clotted left thigh AV graft  POSTOP DIAGNOSIS:    Same  PROCEDURE:    THROMBECTOMY LEFT THIGH AV GRAFT   SURGEON: Di Kindlehristopher S. Edilia Boickson, MD, FACS  ASSIST: Dorcas Carrowassie Robinson RNFA  ANESTHESIA: Gen.   EBL: Minimal  INDICATIONS:   Shane Hamilton is a 51 y.o. male who had a left thigh AV graft placed on 01/15/2017. He has a functioning right femoral tunneled dialysis catheter. He was seen in the office in follow up on 02/04/2017 and the graft was working well and the incisions were healing nicely. He was felt to be very high-risk for infection because of his morbid obesity. He had been on Coumadin in the past for a pulmonary embolus but this was discontinued as it had been some time since this event. His graft reportedly clotted on 02/22/2017, 11 days ago. He was set up for thrombectomy of his graft as interventional radiology was not able to declot the graft. Notes say that he was on Coumadin but he states that he has been off of Coumadin.  FINDINGS:   The graft was easily thrombectomized and both the arterial and venous ends were widely patent. The arterial plug was retrieved. However, the graft was poorly incorporated and I have recommended that they not cannulate the graft for at least 3-4 weeks. In addition, his blood pressure runs low and this could certainly contribute to graft thrombosis. I think that he should also be considered for restarting his Coumadin to help maintain patency of his graft although this is no longer indicated for his pulmonary embolus.  TECHNIQUE:    The patient was taken to the operating room and proceeded to general anesthetic. The left thigh was prepped and draped in usual sterile fashion after his pannus was taped superiorly. Given the high-risk of groin infection I elected to try to stay out of the groin. An incision was made over the  distal loop of the graft near the graft was dissected free. It appeared that the graft to been cannulated hairs in several areas. The patient was heparinized. Graft thrombectomy was achieved using a #3 and #4 Fogarty catheter. I was able to pass the 4 Fogarty the entire length of the venous end without significant obstruction suggesting that the outflow vein was widely patent. In addition, heparinized saline flushed easily. Next the arterial limb of the graft was thrombectomized and the arterial plug was clearly retrieved. Multiple passes were made until no further clot was treated. The graft was flushed with heparinized saline and clamped. A damaged segment of the graft from cannulation sites was excised and the graft sewn back into 2 and with continuous 6-0 Prolene suture. At the completion was a good thrill in the graft. His blood pressure was running low 90-100 systolic. The wound was then closed with a deep layer 3-0 Vicryl the skin closed with 4-0 Vicryl. Dermabond was applied. The patient tolerated the procedure well and was transferred to the recovery room in stable condition. All needle and sponge counts were correct.  Waverly Ferrarihristopher Dickson, MD, FACS [facility]  DATE OF DICTATION:   03/05/2017

## 2017-03-05 NOTE — Telephone Encounter (Signed)
-----   Message from Chuck Hinthristopher S Dickson, MD sent at 03/05/2017  3:10 PM EDT ----- Regarding: charge  PROCEDURE:   THROMBECTOMY LEFT THIGH AV GRAFT   SURGEON: Di Kindlehristopher S. Edilia Boickson, MD, FACS  ASSIST: Dorcas Carrowassie Robinson RNFA  This patient's graft was poorly incorporated although there were no obvious signs of infection. I did send a culture. The graft had been clotted for 11 days but he was scheduled for thrombectomy given that he had a catheter. The patient was not on Coumadin.  Given the early graft thrombosis I have recommended to nephrology that they restart his Coumadin. I have also asked him not to cannulate the graft with 3-4 weeks given that this was poorly incorporated and continue to use his right femoral tunneled dialysis catheter. In addition his blood pressure runs low which could be a potential cause of his graft thrombosis and I have alerted nephrology about this.

## 2017-03-05 NOTE — Transfer of Care (Signed)
Immediate Anesthesia Transfer of Care Note  Patient: Danise MinaReginald D Journey  Procedure(s) Performed: Procedure(s): THROMBECTOMY AND REVISION OF ARTERIOVENTOUS (AV) GORETEX  GRAFT LEFT THIGH (Left)  Patient Location: PACU  Anesthesia Type:General  Level of Consciousness: awake, alert  and oriented  Airway & Oxygen Therapy: Patient Spontanous Breathing and Patient connected to nasal cannula oxygen  Post-op Assessment: Report given to RN and Post -op Vital signs reviewed and stable  Post vital signs: Reviewed and stable  Last Vitals:  Vitals:   03/05/17 1025  BP: (!) 156/100  Pulse: (!) 103  Resp: 20  Temp: 36.9 C    Last Pain: There were no vitals filed for this visit.    Patients Stated Pain Goal: 7 (03/05/17 1113)  Complications: No apparent anesthesia complications

## 2017-03-06 DIAGNOSIS — N186 End stage renal disease: Secondary | ICD-10-CM | POA: Diagnosis not present

## 2017-03-06 DIAGNOSIS — N2581 Secondary hyperparathyroidism of renal origin: Secondary | ICD-10-CM | POA: Diagnosis not present

## 2017-03-06 DIAGNOSIS — D631 Anemia in chronic kidney disease: Secondary | ICD-10-CM | POA: Diagnosis not present

## 2017-03-06 NOTE — Anesthesia Postprocedure Evaluation (Signed)
Anesthesia Post Note  Patient: Shane Hamilton  Procedure(s) Performed: Procedure(s) (LRB): THROMBECTOMY AND REVISION OF ARTERIOVENTOUS (AV) GORETEX  GRAFT LEFT THIGH (Left)     Patient location during evaluation: PACU Anesthesia Type: General Level of consciousness: awake and alert Pain management: pain level controlled Vital Signs Assessment: post-procedure vital signs reviewed and stable Respiratory status: spontaneous breathing, nonlabored ventilation, respiratory function stable and patient connected to nasal cannula oxygen Cardiovascular status: blood pressure returned to baseline and stable Postop Assessment: no signs of nausea or vomiting Anesthetic complications: no    Last Vitals:  Vitals:   03/05/17 1552 03/05/17 1600  BP: 137/68   Pulse: (!) 107 100  Resp: 18 14  Temp:  36.4 C    Last Pain: There were no vitals filed for this visit.               Shane Hamilton

## 2017-03-07 ENCOUNTER — Encounter (HOSPITAL_COMMUNITY): Payer: Self-pay | Admitting: Vascular Surgery

## 2017-03-08 LAB — AEROBIC CULTURE W GRAM STAIN (SUPERFICIAL SPECIMEN): Gram Stain: NONE SEEN

## 2017-03-08 LAB — AEROBIC CULTURE  (SUPERFICIAL SPECIMEN): CULTURE: NO GROWTH

## 2017-03-09 DIAGNOSIS — N2581 Secondary hyperparathyroidism of renal origin: Secondary | ICD-10-CM | POA: Diagnosis not present

## 2017-03-09 DIAGNOSIS — D631 Anemia in chronic kidney disease: Secondary | ICD-10-CM | POA: Diagnosis not present

## 2017-03-09 DIAGNOSIS — N186 End stage renal disease: Secondary | ICD-10-CM | POA: Diagnosis not present

## 2017-03-11 DIAGNOSIS — N2581 Secondary hyperparathyroidism of renal origin: Secondary | ICD-10-CM | POA: Diagnosis not present

## 2017-03-11 DIAGNOSIS — N186 End stage renal disease: Secondary | ICD-10-CM | POA: Diagnosis not present

## 2017-03-11 DIAGNOSIS — D631 Anemia in chronic kidney disease: Secondary | ICD-10-CM | POA: Diagnosis not present

## 2017-03-13 DIAGNOSIS — D631 Anemia in chronic kidney disease: Secondary | ICD-10-CM | POA: Diagnosis not present

## 2017-03-13 DIAGNOSIS — N186 End stage renal disease: Secondary | ICD-10-CM | POA: Diagnosis not present

## 2017-03-13 DIAGNOSIS — N2581 Secondary hyperparathyroidism of renal origin: Secondary | ICD-10-CM | POA: Diagnosis not present

## 2017-03-16 DIAGNOSIS — N186 End stage renal disease: Secondary | ICD-10-CM | POA: Diagnosis not present

## 2017-03-16 DIAGNOSIS — D631 Anemia in chronic kidney disease: Secondary | ICD-10-CM | POA: Diagnosis not present

## 2017-03-16 DIAGNOSIS — N2581 Secondary hyperparathyroidism of renal origin: Secondary | ICD-10-CM | POA: Diagnosis not present

## 2017-03-18 DIAGNOSIS — D631 Anemia in chronic kidney disease: Secondary | ICD-10-CM | POA: Diagnosis not present

## 2017-03-18 DIAGNOSIS — N186 End stage renal disease: Secondary | ICD-10-CM | POA: Diagnosis not present

## 2017-03-18 DIAGNOSIS — N2581 Secondary hyperparathyroidism of renal origin: Secondary | ICD-10-CM | POA: Diagnosis not present

## 2017-03-20 DIAGNOSIS — Z992 Dependence on renal dialysis: Secondary | ICD-10-CM | POA: Diagnosis not present

## 2017-03-20 DIAGNOSIS — N186 End stage renal disease: Secondary | ICD-10-CM | POA: Diagnosis not present

## 2017-03-23 DIAGNOSIS — Z992 Dependence on renal dialysis: Secondary | ICD-10-CM | POA: Diagnosis not present

## 2017-03-23 DIAGNOSIS — N186 End stage renal disease: Secondary | ICD-10-CM | POA: Diagnosis not present

## 2017-03-25 DIAGNOSIS — Z7901 Long term (current) use of anticoagulants: Secondary | ICD-10-CM | POA: Diagnosis not present

## 2017-03-25 DIAGNOSIS — D631 Anemia in chronic kidney disease: Secondary | ICD-10-CM | POA: Diagnosis not present

## 2017-03-25 DIAGNOSIS — N2581 Secondary hyperparathyroidism of renal origin: Secondary | ICD-10-CM | POA: Diagnosis not present

## 2017-03-25 DIAGNOSIS — N186 End stage renal disease: Secondary | ICD-10-CM | POA: Diagnosis not present

## 2017-03-27 DIAGNOSIS — N2581 Secondary hyperparathyroidism of renal origin: Secondary | ICD-10-CM | POA: Diagnosis not present

## 2017-03-27 DIAGNOSIS — N186 End stage renal disease: Secondary | ICD-10-CM | POA: Diagnosis not present

## 2017-03-27 DIAGNOSIS — D631 Anemia in chronic kidney disease: Secondary | ICD-10-CM | POA: Diagnosis not present

## 2017-03-30 DIAGNOSIS — Z7901 Long term (current) use of anticoagulants: Secondary | ICD-10-CM | POA: Diagnosis not present

## 2017-03-30 DIAGNOSIS — D631 Anemia in chronic kidney disease: Secondary | ICD-10-CM | POA: Diagnosis not present

## 2017-03-30 DIAGNOSIS — N2581 Secondary hyperparathyroidism of renal origin: Secondary | ICD-10-CM | POA: Diagnosis not present

## 2017-03-30 DIAGNOSIS — N186 End stage renal disease: Secondary | ICD-10-CM | POA: Diagnosis not present

## 2017-04-01 DIAGNOSIS — N186 End stage renal disease: Secondary | ICD-10-CM | POA: Diagnosis not present

## 2017-04-01 DIAGNOSIS — Z7901 Long term (current) use of anticoagulants: Secondary | ICD-10-CM | POA: Diagnosis not present

## 2017-04-01 DIAGNOSIS — N2581 Secondary hyperparathyroidism of renal origin: Secondary | ICD-10-CM | POA: Diagnosis not present

## 2017-04-01 DIAGNOSIS — D631 Anemia in chronic kidney disease: Secondary | ICD-10-CM | POA: Diagnosis not present

## 2017-04-03 DIAGNOSIS — D631 Anemia in chronic kidney disease: Secondary | ICD-10-CM | POA: Diagnosis not present

## 2017-04-03 DIAGNOSIS — N186 End stage renal disease: Secondary | ICD-10-CM | POA: Diagnosis not present

## 2017-04-03 DIAGNOSIS — N2581 Secondary hyperparathyroidism of renal origin: Secondary | ICD-10-CM | POA: Diagnosis not present

## 2017-04-03 DIAGNOSIS — Z992 Dependence on renal dialysis: Secondary | ICD-10-CM | POA: Diagnosis not present

## 2017-04-03 DIAGNOSIS — E1129 Type 2 diabetes mellitus with other diabetic kidney complication: Secondary | ICD-10-CM | POA: Diagnosis not present

## 2017-04-06 DIAGNOSIS — D631 Anemia in chronic kidney disease: Secondary | ICD-10-CM | POA: Diagnosis not present

## 2017-04-06 DIAGNOSIS — N186 End stage renal disease: Secondary | ICD-10-CM | POA: Diagnosis not present

## 2017-04-06 DIAGNOSIS — N2581 Secondary hyperparathyroidism of renal origin: Secondary | ICD-10-CM | POA: Diagnosis not present

## 2017-04-06 DIAGNOSIS — E1129 Type 2 diabetes mellitus with other diabetic kidney complication: Secondary | ICD-10-CM | POA: Diagnosis not present

## 2017-04-08 DIAGNOSIS — Z992 Dependence on renal dialysis: Secondary | ICD-10-CM | POA: Diagnosis not present

## 2017-04-08 DIAGNOSIS — N186 End stage renal disease: Secondary | ICD-10-CM | POA: Diagnosis not present

## 2017-04-10 DIAGNOSIS — Z992 Dependence on renal dialysis: Secondary | ICD-10-CM | POA: Diagnosis not present

## 2017-04-10 DIAGNOSIS — N186 End stage renal disease: Secondary | ICD-10-CM | POA: Diagnosis not present

## 2017-04-13 DIAGNOSIS — N2581 Secondary hyperparathyroidism of renal origin: Secondary | ICD-10-CM | POA: Diagnosis not present

## 2017-04-13 DIAGNOSIS — E1129 Type 2 diabetes mellitus with other diabetic kidney complication: Secondary | ICD-10-CM | POA: Diagnosis not present

## 2017-04-13 DIAGNOSIS — D631 Anemia in chronic kidney disease: Secondary | ICD-10-CM | POA: Diagnosis not present

## 2017-04-13 DIAGNOSIS — N186 End stage renal disease: Secondary | ICD-10-CM | POA: Diagnosis not present

## 2017-04-14 ENCOUNTER — Emergency Department (HOSPITAL_COMMUNITY)
Admission: EM | Admit: 2017-04-14 | Discharge: 2017-04-14 | Disposition: A | Discharge status: Home / Self Care | Payer: Medicare Other | Attending: Emergency Medicine | Admitting: Emergency Medicine

## 2017-04-14 ENCOUNTER — Emergency Department (HOSPITAL_COMMUNITY): Payer: Medicare Other

## 2017-04-14 DIAGNOSIS — S8992XA Unspecified injury of left lower leg, initial encounter: Secondary | ICD-10-CM | POA: Diagnosis not present

## 2017-04-14 DIAGNOSIS — Y9389 Activity, other specified: Secondary | ICD-10-CM | POA: Diagnosis not present

## 2017-04-14 DIAGNOSIS — E1142 Type 2 diabetes mellitus with diabetic polyneuropathy: Secondary | ICD-10-CM | POA: Diagnosis not present

## 2017-04-14 DIAGNOSIS — W010XXA Fall on same level from slipping, tripping and stumbling without subsequent striking against object, initial encounter: Secondary | ICD-10-CM | POA: Diagnosis not present

## 2017-04-14 DIAGNOSIS — Y929 Unspecified place or not applicable: Secondary | ICD-10-CM | POA: Diagnosis not present

## 2017-04-14 DIAGNOSIS — W19XXXA Unspecified fall, initial encounter: Secondary | ICD-10-CM

## 2017-04-14 DIAGNOSIS — M25551 Pain in right hip: Secondary | ICD-10-CM | POA: Diagnosis not present

## 2017-04-14 DIAGNOSIS — I120 Hypertensive chronic kidney disease with stage 5 chronic kidney disease or end stage renal disease: Secondary | ICD-10-CM | POA: Insufficient documentation

## 2017-04-14 DIAGNOSIS — Z79899 Other long term (current) drug therapy: Secondary | ICD-10-CM | POA: Insufficient documentation

## 2017-04-14 DIAGNOSIS — N186 End stage renal disease: Secondary | ICD-10-CM | POA: Diagnosis not present

## 2017-04-14 DIAGNOSIS — Z791 Long term (current) use of non-steroidal anti-inflammatories (NSAID): Secondary | ICD-10-CM | POA: Diagnosis not present

## 2017-04-14 DIAGNOSIS — S83204A Other tear of unspecified meniscus, current injury, left knee, initial encounter: Secondary | ICD-10-CM | POA: Diagnosis not present

## 2017-04-14 DIAGNOSIS — Y999 Unspecified external cause status: Secondary | ICD-10-CM | POA: Insufficient documentation

## 2017-04-14 DIAGNOSIS — Z992 Dependence on renal dialysis: Secondary | ICD-10-CM | POA: Diagnosis not present

## 2017-04-14 DIAGNOSIS — M25562 Pain in left knee: Secondary | ICD-10-CM | POA: Diagnosis not present

## 2017-04-14 MED ORDER — HYDROCODONE-ACETAMINOPHEN 5-325 MG PO TABS
1.0000 | ORAL_TABLET | Freq: Once | ORAL | Status: AC
  Administered 2017-04-14: 1 via ORAL
  Filled 2017-04-14: qty 1

## 2017-04-14 MED ORDER — HYDROCODONE-ACETAMINOPHEN 5-325 MG PO TABS: 1.0000 | ORAL_TABLET | Freq: Two times a day (BID) | ORAL | 0 refills | Status: AC | PRN

## 2017-04-14 NOTE — ED Triage Notes (Signed)
Tripped and fell yesterday hurt rt hip and left knee , did not hit his head

## 2017-04-14 NOTE — Discharge Instructions (Signed)
Please read instructions below. Apply ice to your knee for 20 minutes at a time. Keep the knee brace on until you are cleared by the orthopedic specialist. You can take 1-2 tabs of norco every 12 hours as needed for pain. Schedule an appointment with the orthopedic specialist within 1 week for follow-up on your knee injury and hip xray findings. Return to the ER for new or concerning symptoms.

## 2017-04-14 NOTE — ED Provider Notes (Signed)
[facility]-EM[facility]Y DEPT Provider Note   CSN: [id number] Arrival date & time: 04/14/17  1452  By signing my name below, I, Rosana Fret, attest that this documentation has been prepared under the direction and in the presence of non-physician practitioner, Russo, [place], PA-C. Electronically Signed: Rosana Fret, ED Scribe. 04/14/17. 6:35 PM.  History   Chief Complaint Chief Complaint  Patient presents with  . Fall   The history is provided by the patient. No language interpreter was used.   HPI Comments: Shane Hamilton is a 51 y.o. male with a PMHx of DM, ESRD on dialysis [facility], and OSA, who presents to the Emergency Department complaining of a sudden onset mechanical, ground level fall that took place yesterday afternoon. Pt states he tripped over a tree root, injuring his left knee and right hip. And he fell, he states his left knee twisted and he fell onto his right hip. He states left knee feels as if it will give way when he weight bears. No crepitus noted in right hip. No hx of similar problems. No LOC or head injury. Pt states he uses a walker intermittently at baseline. No treatments tried for pain PTA. Pt states he takes a blood thinner. Pt denies back pain, neck pain or any other complaints at this time.  Past Medical History:  Diagnosis Date  . Abscess of right arm 06/17/2015  . Anemia   . Anxiety   . Arteriovenous fistula infection (HCC)   . Cardiomegaly   . Chronic anticoagulation   . Chronic pancreatitis (HCC)   . DVT (deep venous thrombosis) (HCC) 2011  . ESRD (end stage renal disease) (HCC)    [facility] [place]  . GERD (gastroesophageal reflux disease)   . Gout   . Headache    migraines  . History of kidney stones   . Hypertension   . Kidney stones   . LOC (loss of consciousness) (HCC)   . Morbid obesity (HCC)   . OSA (obstructive sleep apnea)    wears CPAP  . Pneumonia   . Secondary hyperparathyroidism (HCC)   . Seizures (HCC)    pt denies  .  Septic shock(785.52)   . Shortness of breath dyspnea    with exertion  . Type 2 diabetes mellitus, uncontrolled (HCC)    Type 2    Patient Active Problem List   Diagnosis Date Noted  . ESRD (end stage renal disease) (HCC) 01/15/2017  . Diabetic polyneuropathy associated with diabetes mellitus due to underlying condition (HCC) 09/09/2016  . NSVT (nonsustained ventricular tachycardia) (HCC) 03/29/2016  . Bacteremia 03/27/2016  . Fever 03/26/2016  . Sepsis (HCC) 03/26/2016  . End-stage renal disease on hemodialysis (HCC)   . Gout 03/04/2016  . Atherosclerosis of native arteries of extremity with intermittent claudication (HCC) 10/11/2015  . AV fistula infection (HCC) 06/18/2015  . DVT (deep venous thrombosis) (HCC) 06/17/2015  . Chronic anticoagulation   . Type 2 diabetes mellitus, uncontrolled (HCC)   . Morbid obesity (HCC)   . Seizures (HCC)   . Obstructive sleep apnea 08/06/2011    Past Surgical History:  Procedure Laterality Date  . AMPUTATION FINGER / THUMB Right   . ANGIOPLASTY ILLIAC ARTERY Right 05/15/2016   Procedure: FIBRIN SHEATH ANGIOPLASTY;  Surgeon: Larina Earthly, MD;  Location: [facility] OR;  Service: Vascular;  Laterality: Right;  . APPLICATION OF WOUND VAC Left 06/20/2015   Procedure: APPLICATION OF WOUND VAC;  Surgeon: Renford Dills, MD;  Location: [facility] ORS;  Service: Vascular;  Laterality: Left;  . APPLICATION OF WOUND VAC Left 01/15/2017   AV GRAFT  . AV FISTULA PLACEMENT    . AV FISTULA PLACEMENT Left 06/20/2015   Procedure: ARTERIOVENOUS (AV) FISTULA  ligation , excision;  Surgeon: Renford Dills, MD;  Location: [facility] ORS;  Service: Vascular;  Laterality: Left;  . AV FISTULA PLACEMENT Left 12/04/2016   Procedure: EXPLORATION LEFT UPPER ARM;  Surgeon: Chuck Hint, MD;  Location: [facility] OR;  Service: Vascular;  Laterality: Left;  . AV FISTULA PLACEMENT Left 01/15/2017   Procedure: INSERTION OF ARTERIOVENOUS (AV) GORE-TEX STRETCH GRAFT THIGH;  Surgeon:  Chuck Hint, MD;  Location: [facility] OR;  Service: Vascular;  Laterality: Left;  . EXCHANGE OF A DIALYSIS CATHETER Left 11/13/2015   Procedure: EXCHANGE OF A DIALYSIS CATHETER- LEFT INTERNAL JUGULAR;  Surgeon: Nada Libman, MD;  Location: [facility] OR;  Service: Vascular;  Laterality: Left;  . EXCHANGE OF A DIALYSIS CATHETER Right 05/15/2016   Procedure: EXCHANGE OF A RIGHT FEMORAL DIALYSIS CATHETER;  Surgeon: Larina Earthly, MD;  Location: [facility] OR;  Service: Vascular;  Laterality: Right;  . I&D EXTREMITY Left 06/20/2015   Procedure: IRRIGATION AND DEBRIDEMENT EXTREMITY;  Surgeon: Renford Dills, MD;  Location: [facility] ORS;  Service: Vascular;  Laterality: Left;  . INSERTION OF DIALYSIS CATHETER N/A 06/25/2015   Procedure: place a tunnelled catheter for long term use;  Surgeon: Annice Needy, MD;  Location: [facility] ORS;  Service: Vascular;  Laterality: N/A;  . INSERTION OF DIALYSIS CATHETER Left 07/25/2015   Procedure: INSERTION OF LEFT INTERNAL JUGULAR DIALYSIS CATHETER;  Surgeon: Pryor Ochoa, MD;  Location: [facility] OR;  Service: Vascular;  Laterality: Left;  . INSERTION OF DIALYSIS CATHETER Left 08/01/2015   Procedure: REMOVAL OF DIALYSIS CATHETER; PLACEMNET OF NEW DIALYSIS CATHETER;  Surgeon: Pryor Ochoa, MD;  Location: [facility] OR;  Service: Vascular;  Laterality: Left;  . INSERTION OF DIALYSIS CATHETER Right 03/31/2016   Procedure: INSERTION OF DIALYSIS CATHETER RIGHT FEMORAL ;  Surgeon: Chuck Hint, MD;  Location: [facility] OR;  Service: Vascular;  Laterality: Right;  . IR FLUORO GUIDE CV LINE RIGHT  01/20/2017  . IR FLUORO GUIDE CV LINE RIGHT  01/27/2017  . IR FLUORO GUIDE CV LINE RIGHT  02/03/2017  . IR GENERIC HISTORICAL  05/28/2016   IR FLUORO GUIDE CV LINE RIGHT 05/28/2016 Berdine Dance, MD [facility]  . IR GENERIC HISTORICAL  06/11/2016   IR FLUORO GUIDE CV LINE RIGHT 06/11/2016 [facility]  . IR THROMBECTOMY AV FISTULA W/THROMBOLYSIS/PTA INC/SHUNT/IMG LEFT Left 02/22/2017  . IR US GUIDE VASC ACCESS LEFT   02/22/2017  . IR VENOCAVAGRAM IVC  02/03/2017  . PERIPHERAL VASCULAR CATHETERIZATION N/A 06/25/2015   Procedure: Dialysis/Perma Catheter Insertion;  Surgeon: Annice Needy, MD;  Location: [facility] INVASIVE CV LAB;  Service: Cardiovascular;  Laterality: N/A;  . PERIPHERAL VASCULAR CATHETERIZATION Bilateral 09/13/2015   Procedure: Upper Extremity Venography;  Surgeon: Sherren Kerns, MD;  Location: [facility] INVASIVE CV LAB;  Service: Cardiovascular;  Laterality: Bilateral;  . THROMBECTOMY AND REVISION OF ARTERIOVENTOUS (AV) GORETEX  GRAFT Left 03/05/2017   Procedure: THROMBECTOMY AND REVISION OF ARTERIOVENTOUS (AV) GORETEX  GRAFT LEFT THIGH;  Surgeon: Chuck Hint, MD;  Location: [facility] OR;  Service: Vascular;  Laterality: Left;       Home Medications    Prior to Admission medications   Medication Sig Start Date End Date Taking? Authorizing Provider  allopurinol (ZYLOPRIM) 300 MG tablet Take 600 mg by mouth daily.  [provider]  calcium acetate (PHOSLO) 667 MG capsule Take 2,001 mg by mouth 3 (three) times daily with meals. Also with snacks    [provider]  cinacalcet (SENSIPAR) 60 MG tablet Take 60 mg by mouth See admin instructions. Take 60 mg by mouth on Tuesday and Saturday    [provider]  clonazePAM (KLONOPIN) 1 MG tablet Take 1 tablet (1 mg total) by mouth 2 (two) times daily as needed for anxiety. Patient taking differently: Take 2 mg by mouth every evening.  12/04/16   Raymond Gurney, PA-C  colchicine 0.6 MG tablet Take 1.2 mg by mouth daily.     [provider]  gabapentin (NEURONTIN) 300 MG capsule Take 1 capsule (300 mg total) by mouth 3 (three) times daily as needed (pain). 12/04/16   Raymond Gurney, PA-C  HYDROcodone-acetaminophen (NORCO/VICODIN) 5-325 MG tablet Take 1-2 tablets by mouth every 12 (twelve) hours as needed for severe pain. 04/14/17   Russo, [place] N, PA-C  ibuprofen (ADVIL,MOTRIN) 800 MG tablet Take 800 mg by mouth every 8  (eight) hours as needed for moderate pain.    [provider]  lanthanum (FOSRENOL) 1000 MG chewable tablet Chew 1,000 mg by mouth 3 (three) times daily with meals. 01/27/17   [provider]  LEVEMIR FLEXTOUCH 100 UNIT/ML Pen INJECT 70 UNITS UNDER THE SKIN QHS 08/06/16   [provider]  lidocaine (XYLOCAINE) 5 % ointment Apply to feet twice daily as needed.  Use with gloves/cotton swab 09/09/16   Nita Sickle K, DO  multivitamin (RENA-VIT) TABS tablet Take 1 tablet by mouth daily.  12/05/14   [provider]  nortriptyline (PAMELOR) 10 MG capsule Take 1 capsule (10 mg total) by mouth at bedtime. 09/09/16   Patel, Donika K, DO  NOVOLOG FLEXPEN 100 UNIT/ML FlexPen Inject 35 Units as directed 3 (three) times daily with meals. 04/20/16   [provider]  omeprazole (PRILOSEC) 40 MG capsule Take 40 mg by mouth daily 01/06/17   [provider]  oxyCODONE-acetaminophen (ROXICET) 5-325 MG tablet Take 1-2 tablets by mouth every 4 (four) hours as needed. 03/05/17   Chuck Hint, MD  Tetrahydrozoline HCl (VISINE OP) Place 1 drop into both eyes daily as needed (dry eyes).     [provider]  traMADol (ULTRAM) 50 MG tablet Take 1 tablet (50 mg total) by mouth every 6 (six) hours as needed for moderate pain. 01/15/17   Rhyne, Ames Coupe, PA-C  warfarin (COUMADIN) 5 MG tablet Take 5 mg by mouth daily 10/30/16   [provider]    Family History Family History  Problem Relation Age of Onset  . Diabetes Mother   . Hypertension Mother   . Diabetes Father   . Hypertension Father   . Hypertension Brother     Social History Social History  Substance Use Topics  . Smoking status: Never Smoker  . Smokeless tobacco: Never Used  . Alcohol use No     Allergies   Aspirin   Review of Systems Review of Systems  Constitutional: Negative for fever.  Musculoskeletal: Positive for arthralgias and myalgias. Negative for back pain and neck  pain.  Skin: Negative for wound.  Neurological: Positive for numbness. Negative for syncope, weakness and headaches.  Hematological: Bruises/bleeds easily.     Physical Exam Updated Vital Signs BP (!) 167/95 (BP Location: Left Arm)   Pulse (!) 105   Temp 98.2 F (36.8 C) (Oral)   Resp 18  SpO2 93%   Physical Exam  Constitutional: He appears well-developed and well-nourished. No distress.  Morbidly obese.   HENT:  Head: Normocephalic and atraumatic.  Eyes: Conjunctivae are normal.  Neck: Normal range of motion.  Cardiovascular: Intact distal pulses.   Slightly tachycardic  Pulmonary/Chest: Effort normal.  Musculoskeletal: Normal range of motion. He exhibits tenderness.  Left knee with edema and  tenderness to medial and lateral joint lines. Normal ROM. Unable to assess stability secondary to pt's body habitus.  Right lateral hip with TTP.  No spine or paraspinal tenderness.  Neurological: He is alert.  Normal sensation in bilateral lower extremities. Gait with limp favoring left   Psychiatric: He has a normal mood and affect. His behavior is normal.  Nursing note and vitals reviewed.    ED Treatments / Results  DIAGNOSTIC STUDIES: Oxygen Saturation is 91% on RA, low by my interpretation.   COORDINATION OF CARE: 6:09 PM-Discussed next steps with pt including pain management and follow up with ortho. Pt verbalized understanding and is agreeable with the plan.   Labs (all labs ordered are listed, but only abnormal results are displayed) Labs Reviewed - No data to display  EKG  EKG Interpretation None       Radiology Dg Knee Complete 4 Views Left  Result Date: 04/14/2017 CLINICAL DATA:  Anterior left knee pain, morbid obesity EXAM: LEFT KNEE - COMPLETE 4+ VIEW COMPARISON:  03/31/2016 FINDINGS: Left knee tricompartmental osteoarthritis noted, most pronounced medially with joint space loss, sclerosis and osteophytes. Subchondral cystic change in the femoral  condyles medially. No acute osseous finding, fracture, subluxation, dislocation, or large effusion. Obese body habitus evident. IMPRESSION: Left knee tricompartmental osteoarthritis, most pronounced medially. No acute finding by plain radiography Electronically Signed   By: Judie PetitM.  Shick M.D.   On: 04/14/2017 16:30   Dg Hip Unilat With Pelvis 2-3 Views Right  Result Date: 04/14/2017 CLINICAL DATA:  Acute right hip pain without known injury. EXAM: DG HIP (WITH OR WITHOUT PELVIS) 2-3V RIGHT COMPARISON:  Radiographs of August 25, 2016. FINDINGS: No acute fracture or dislocation is noted. Moderate narrowing of the superior portion of right hip joint is noted consistent with degenerative joint disease. Sclerosis of lucency is seen in the right femoral head suggesting avascular necrosis. 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 Moderate degenerative disc disease of the right hip is noted. No acute abnormality seen. Electronically Signed   By: Lupita RaiderJames  Green Jr, M.D.   On: 04/14/2017 16:28    Procedures Procedures (including critical care time)  Medications Ordered in ED Medications  HYDROcodone-acetaminophen (NORCO/VICODIN) 5-325 MG per tablet 1 tablet (1 tablet Oral Given 04/14/17 1833)     Initial Impression / Assessment and Plan / ED Course  I have reviewed the triage vital signs and the nursing notes.  Pertinent labs & imaging results that were available during my care of the patient were reviewed by me and considered in my medical decision making (see chart for details).     Patient With left knee and right hip pain status post mechanical fall. Pt without head trauma or LOC. X-Rays negative for obvious fracture or dislocation; left knee showing osteoarthritis, right hip showing AVN, likely chronic. Unable to assess stability of the knee secondary to patient's body habitus. Knee placed in immobilizer splint. Pt advised to follow up with orthopedics. Conservative therapy  recommended and discussed. Patient given short course of Norco for pain, with dose adjustments. Patient will be discharged home & is agreeable with  above plan. Returns precautions discussed. Pt appears safe for discharge.  Patient discussed with and seen by Dr. Deretha Emory, who agrees with care plan.  [place] Controlled Substance reporting System queried  Discussed results, findings, treatment and follow up. Patient advised of return precautions. Patient verbalized understanding and agreed with plan.  Final Clinical Impressions(s) / ED Diagnoses   Final diagnoses:  Left knee injury, initial encounter  Acute right hip pain    New Prescriptions Discharge Medication List as of 04/14/2017  7:03 PM    START taking these medications   Details  HYDROcodone-acetaminophen (NORCO/VICODIN) 5-325 MG tablet Take 1-2 tablets by mouth every 12 (twelve) hours as needed for severe pain., Starting Wed 04/14/2017, Print       I personally performed the services described in this documentation, which was scribed in my presence. The recorded information has been reviewed and is accurate.     Russo, [place] N, PA-C 04/14/17 2013    Russo, [place] N, PA-C 04/14/17 2037    Vanetta Mulders, MD 04/16/17 [phone]

## 2017-04-14 NOTE — ED Notes (Signed)
Ortho informed to see patient

## 2017-04-14 NOTE — ED Provider Notes (Signed)
Medical screening examination/treatment/procedure(s) were conducted as a shared visit with non-physician practitioner(s) and myself.  I personally evaluated the patient during the encounter.   EKG Interpretation None        Results for orders placed or performed during the hospital encounter of 03/05/17  Aerobic Culture (superficial specimen)  Result Value Ref Range   Specimen Description WOUND LEFT THIGH PERI GRAFT    Special Requests NONE    Gram Stain NO WBC SEEN NO ORGANISMS SEEN     Culture NO GROWTH 2 DAYS    Report Status 03/08/2017 FINAL   APTT  Result Value Ref Range   aPTT 33 24 - 36 seconds  Protime-INR  Result Value Ref Range   Prothrombin Time 17.1 (H) 11.4 - 15.2 seconds   INR 1.39   Glucose, capillary  Result Value Ref Range   Glucose-Capillary 122 (H) 65 - 99 mg/dL   Comment 1 Notify RN    Comment 2 Document in Chart   I-STAT 4, (NA,K, GLUC, HGB,HCT)  Result Value Ref Range   Sodium 139 135 - 145 mmol/L   Potassium 3.9 3.5 - 5.1 mmol/L   Glucose, Bld 129 (H) 65 - 99 mg/dL   HCT 16.137.0 (L) 09.639.0 - 04.552.0 %   Hemoglobin 12.6 (L) 13.0 - 17.0 g/dL   Dg Knee Complete 4 Views Left  Result Date: 04/14/2017 CLINICAL DATA:  Anterior left knee pain, morbid obesity EXAM: LEFT KNEE - COMPLETE 4+ VIEW COMPARISON:  03/31/2016 FINDINGS: Left knee tricompartmental osteoarthritis noted, most pronounced medially with joint space loss, sclerosis and osteophytes. Subchondral cystic change in the femoral condyles medially. No acute osseous finding, fracture, subluxation, dislocation, or large effusion. Obese body habitus evident. IMPRESSION: Left knee tricompartmental osteoarthritis, most pronounced medially. No acute finding by plain radiography Electronically Signed   By: Judie PetitM.  Shick M.D.   On: 04/14/2017 16:30   Dg Hip Unilat With Pelvis 2-3 Views Right  Result Date: 04/14/2017 CLINICAL DATA:  Acute right hip pain without known injury. EXAM: DG HIP (WITH OR WITHOUT PELVIS) 2-3V  RIGHT COMPARISON:  Radiographs of August 25, 2016. FINDINGS: No acute fracture or dislocation is noted. Moderate narrowing of the superior portion of right hip joint is noted consistent with degenerative joint disease. Sclerosis of lucency is seen in the right femoral head suggesting avascular necrosis. 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 Moderate degenerative disc disease of the right hip is noted. No acute abnormality seen. Electronically Signed   By: Lupita RaiderJames  Green Jr, M.D.   On: 04/14/2017 16:28     Patient status post fall yesterday. Patient with complaint of right hip pain left knee pain. X-rays of both areas without any acute bony injuries. Possible ligamental injury to left knee. Will need orthopedic follow-up. Also questionable AVN to the right hip. Patient is obese. Most appropriate would be knee immobilizer for the left knee. Will have orthostatic tach sort out whether this is feasible due to his large size. Patient will be treated with short course of hydrocodone. Patient is a dialysis patient. Patient is stable for discharge home.  On exam no deformity to the right hip area. Left knee with some swelling. Limited range of motion. Patient's alert and oriented.   Vanetta MuldersZackowski, Janee Ureste, MD 04/14/17 [phone]

## 2017-04-15 DIAGNOSIS — N2581 Secondary hyperparathyroidism of renal origin: Secondary | ICD-10-CM | POA: Diagnosis not present

## 2017-04-15 DIAGNOSIS — D631 Anemia in chronic kidney disease: Secondary | ICD-10-CM | POA: Diagnosis not present

## 2017-04-15 DIAGNOSIS — E1129 Type 2 diabetes mellitus with other diabetic kidney complication: Secondary | ICD-10-CM | POA: Diagnosis not present

## 2017-04-15 DIAGNOSIS — Z7901 Long term (current) use of anticoagulants: Secondary | ICD-10-CM | POA: Diagnosis not present

## 2017-04-15 DIAGNOSIS — N186 End stage renal disease: Secondary | ICD-10-CM | POA: Diagnosis not present

## 2017-04-17 DIAGNOSIS — N2581 Secondary hyperparathyroidism of renal origin: Secondary | ICD-10-CM | POA: Diagnosis not present

## 2017-04-17 DIAGNOSIS — E1129 Type 2 diabetes mellitus with other diabetic kidney complication: Secondary | ICD-10-CM | POA: Diagnosis not present

## 2017-04-17 DIAGNOSIS — D631 Anemia in chronic kidney disease: Secondary | ICD-10-CM | POA: Diagnosis not present

## 2017-04-17 DIAGNOSIS — N186 End stage renal disease: Secondary | ICD-10-CM | POA: Diagnosis not present

## 2017-04-19 DIAGNOSIS — M1712 Unilateral primary osteoarthritis, left knee: Secondary | ICD-10-CM | POA: Diagnosis not present

## 2017-04-20 DIAGNOSIS — N186 End stage renal disease: Secondary | ICD-10-CM | POA: Diagnosis not present

## 2017-04-20 DIAGNOSIS — N2581 Secondary hyperparathyroidism of renal origin: Secondary | ICD-10-CM | POA: Diagnosis not present

## 2017-04-20 DIAGNOSIS — D631 Anemia in chronic kidney disease: Secondary | ICD-10-CM | POA: Diagnosis not present

## 2017-04-20 DIAGNOSIS — E1129 Type 2 diabetes mellitus with other diabetic kidney complication: Secondary | ICD-10-CM | POA: Diagnosis not present

## 2017-04-21 DIAGNOSIS — M25562 Pain in left knee: Secondary | ICD-10-CM | POA: Diagnosis not present

## 2017-04-21 DIAGNOSIS — M25422 Effusion, left elbow: Secondary | ICD-10-CM | POA: Diagnosis not present

## 2017-04-22 DIAGNOSIS — Z7901 Long term (current) use of anticoagulants: Secondary | ICD-10-CM | POA: Diagnosis not present

## 2017-04-22 DIAGNOSIS — N2581 Secondary hyperparathyroidism of renal origin: Secondary | ICD-10-CM | POA: Diagnosis not present

## 2017-04-22 DIAGNOSIS — N186 End stage renal disease: Secondary | ICD-10-CM | POA: Diagnosis not present

## 2017-04-22 DIAGNOSIS — E1129 Type 2 diabetes mellitus with other diabetic kidney complication: Secondary | ICD-10-CM | POA: Diagnosis not present

## 2017-04-22 DIAGNOSIS — D631 Anemia in chronic kidney disease: Secondary | ICD-10-CM | POA: Diagnosis not present

## 2017-04-24 DIAGNOSIS — N2581 Secondary hyperparathyroidism of renal origin: Secondary | ICD-10-CM | POA: Diagnosis not present

## 2017-04-24 DIAGNOSIS — N186 End stage renal disease: Secondary | ICD-10-CM | POA: Diagnosis not present

## 2017-04-24 DIAGNOSIS — D631 Anemia in chronic kidney disease: Secondary | ICD-10-CM | POA: Diagnosis not present

## 2017-04-24 DIAGNOSIS — E1129 Type 2 diabetes mellitus with other diabetic kidney complication: Secondary | ICD-10-CM | POA: Diagnosis not present

## 2017-04-27 DIAGNOSIS — N186 End stage renal disease: Secondary | ICD-10-CM | POA: Diagnosis not present

## 2017-04-27 DIAGNOSIS — N2581 Secondary hyperparathyroidism of renal origin: Secondary | ICD-10-CM | POA: Diagnosis not present

## 2017-04-27 DIAGNOSIS — D631 Anemia in chronic kidney disease: Secondary | ICD-10-CM | POA: Diagnosis not present

## 2017-04-27 DIAGNOSIS — E1129 Type 2 diabetes mellitus with other diabetic kidney complication: Secondary | ICD-10-CM | POA: Diagnosis not present

## 2017-04-28 DIAGNOSIS — M1712 Unilateral primary osteoarthritis, left knee: Secondary | ICD-10-CM | POA: Diagnosis not present

## 2017-04-29 DIAGNOSIS — N2581 Secondary hyperparathyroidism of renal origin: Secondary | ICD-10-CM | POA: Diagnosis not present

## 2017-04-29 DIAGNOSIS — Z7901 Long term (current) use of anticoagulants: Secondary | ICD-10-CM | POA: Diagnosis not present

## 2017-04-29 DIAGNOSIS — D631 Anemia in chronic kidney disease: Secondary | ICD-10-CM | POA: Diagnosis not present

## 2017-04-29 DIAGNOSIS — N186 End stage renal disease: Secondary | ICD-10-CM | POA: Diagnosis not present

## 2017-04-29 DIAGNOSIS — E1129 Type 2 diabetes mellitus with other diabetic kidney complication: Secondary | ICD-10-CM | POA: Diagnosis not present

## 2017-05-01 DIAGNOSIS — D631 Anemia in chronic kidney disease: Secondary | ICD-10-CM | POA: Diagnosis not present

## 2017-05-01 DIAGNOSIS — N2581 Secondary hyperparathyroidism of renal origin: Secondary | ICD-10-CM | POA: Diagnosis not present

## 2017-05-01 DIAGNOSIS — N186 End stage renal disease: Secondary | ICD-10-CM | POA: Diagnosis not present

## 2017-05-01 DIAGNOSIS — E1129 Type 2 diabetes mellitus with other diabetic kidney complication: Secondary | ICD-10-CM | POA: Diagnosis not present

## 2017-05-04 DIAGNOSIS — Z992 Dependence on renal dialysis: Secondary | ICD-10-CM | POA: Diagnosis not present

## 2017-05-04 DIAGNOSIS — N186 End stage renal disease: Secondary | ICD-10-CM | POA: Diagnosis not present

## 2017-05-04 DIAGNOSIS — E1129 Type 2 diabetes mellitus with other diabetic kidney complication: Secondary | ICD-10-CM | POA: Diagnosis not present

## 2017-05-04 DIAGNOSIS — D631 Anemia in chronic kidney disease: Secondary | ICD-10-CM | POA: Diagnosis not present

## 2017-05-04 DIAGNOSIS — N2581 Secondary hyperparathyroidism of renal origin: Secondary | ICD-10-CM | POA: Diagnosis not present

## 2017-05-06 DIAGNOSIS — N2581 Secondary hyperparathyroidism of renal origin: Secondary | ICD-10-CM | POA: Diagnosis not present

## 2017-05-06 DIAGNOSIS — D631 Anemia in chronic kidney disease: Secondary | ICD-10-CM | POA: Diagnosis not present

## 2017-05-06 DIAGNOSIS — Z7901 Long term (current) use of anticoagulants: Secondary | ICD-10-CM | POA: Diagnosis not present

## 2017-05-06 DIAGNOSIS — T8249XD Other complication of vascular dialysis catheter, subsequent encounter: Secondary | ICD-10-CM | POA: Diagnosis not present

## 2017-05-06 DIAGNOSIS — B958 Unspecified staphylococcus as the cause of diseases classified elsewhere: Secondary | ICD-10-CM | POA: Diagnosis not present

## 2017-05-06 DIAGNOSIS — N186 End stage renal disease: Secondary | ICD-10-CM | POA: Diagnosis not present

## 2017-05-08 DIAGNOSIS — T8249XD Other complication of vascular dialysis catheter, subsequent encounter: Secondary | ICD-10-CM | POA: Diagnosis not present

## 2017-05-08 DIAGNOSIS — N2581 Secondary hyperparathyroidism of renal origin: Secondary | ICD-10-CM | POA: Diagnosis not present

## 2017-05-08 DIAGNOSIS — D631 Anemia in chronic kidney disease: Secondary | ICD-10-CM | POA: Diagnosis not present

## 2017-05-08 DIAGNOSIS — N186 End stage renal disease: Secondary | ICD-10-CM | POA: Diagnosis not present

## 2017-05-08 DIAGNOSIS — B958 Unspecified staphylococcus as the cause of diseases classified elsewhere: Secondary | ICD-10-CM | POA: Diagnosis not present

## 2017-05-11 DIAGNOSIS — B958 Unspecified staphylococcus as the cause of diseases classified elsewhere: Secondary | ICD-10-CM | POA: Diagnosis not present

## 2017-05-11 DIAGNOSIS — N186 End stage renal disease: Secondary | ICD-10-CM | POA: Diagnosis not present

## 2017-05-11 DIAGNOSIS — T8249XD Other complication of vascular dialysis catheter, subsequent encounter: Secondary | ICD-10-CM | POA: Diagnosis not present

## 2017-05-11 DIAGNOSIS — N2581 Secondary hyperparathyroidism of renal origin: Secondary | ICD-10-CM | POA: Diagnosis not present

## 2017-05-11 DIAGNOSIS — D631 Anemia in chronic kidney disease: Secondary | ICD-10-CM | POA: Diagnosis not present

## 2017-05-13 DIAGNOSIS — D631 Anemia in chronic kidney disease: Secondary | ICD-10-CM | POA: Diagnosis not present

## 2017-05-13 DIAGNOSIS — N2581 Secondary hyperparathyroidism of renal origin: Secondary | ICD-10-CM | POA: Diagnosis not present

## 2017-05-13 DIAGNOSIS — Z7901 Long term (current) use of anticoagulants: Secondary | ICD-10-CM | POA: Diagnosis not present

## 2017-05-13 DIAGNOSIS — N186 End stage renal disease: Secondary | ICD-10-CM | POA: Diagnosis not present

## 2017-05-13 DIAGNOSIS — T8249XD Other complication of vascular dialysis catheter, subsequent encounter: Secondary | ICD-10-CM | POA: Diagnosis not present

## 2017-05-13 DIAGNOSIS — B958 Unspecified staphylococcus as the cause of diseases classified elsewhere: Secondary | ICD-10-CM | POA: Diagnosis not present

## 2017-05-15 DIAGNOSIS — N2581 Secondary hyperparathyroidism of renal origin: Secondary | ICD-10-CM | POA: Diagnosis not present

## 2017-05-15 DIAGNOSIS — B958 Unspecified staphylococcus as the cause of diseases classified elsewhere: Secondary | ICD-10-CM | POA: Diagnosis not present

## 2017-05-15 DIAGNOSIS — D631 Anemia in chronic kidney disease: Secondary | ICD-10-CM | POA: Diagnosis not present

## 2017-05-15 DIAGNOSIS — T8249XD Other complication of vascular dialysis catheter, subsequent encounter: Secondary | ICD-10-CM | POA: Diagnosis not present

## 2017-05-15 DIAGNOSIS — N186 End stage renal disease: Secondary | ICD-10-CM | POA: Diagnosis not present

## 2017-05-18 DIAGNOSIS — D631 Anemia in chronic kidney disease: Secondary | ICD-10-CM | POA: Diagnosis not present

## 2017-05-18 DIAGNOSIS — N2581 Secondary hyperparathyroidism of renal origin: Secondary | ICD-10-CM | POA: Diagnosis not present

## 2017-05-18 DIAGNOSIS — N186 End stage renal disease: Secondary | ICD-10-CM | POA: Diagnosis not present

## 2017-05-18 DIAGNOSIS — T8249XD Other complication of vascular dialysis catheter, subsequent encounter: Secondary | ICD-10-CM | POA: Diagnosis not present

## 2017-05-18 DIAGNOSIS — B958 Unspecified staphylococcus as the cause of diseases classified elsewhere: Secondary | ICD-10-CM | POA: Diagnosis not present

## 2017-05-20 DIAGNOSIS — D631 Anemia in chronic kidney disease: Secondary | ICD-10-CM | POA: Diagnosis not present

## 2017-05-20 DIAGNOSIS — N2581 Secondary hyperparathyroidism of renal origin: Secondary | ICD-10-CM | POA: Diagnosis not present

## 2017-05-20 DIAGNOSIS — N186 End stage renal disease: Secondary | ICD-10-CM | POA: Diagnosis not present

## 2017-05-20 DIAGNOSIS — T8249XD Other complication of vascular dialysis catheter, subsequent encounter: Secondary | ICD-10-CM | POA: Diagnosis not present

## 2017-05-20 DIAGNOSIS — B958 Unspecified staphylococcus as the cause of diseases classified elsewhere: Secondary | ICD-10-CM | POA: Diagnosis not present

## 2017-05-22 DIAGNOSIS — N186 End stage renal disease: Secondary | ICD-10-CM | POA: Diagnosis not present

## 2017-05-22 DIAGNOSIS — B958 Unspecified staphylococcus as the cause of diseases classified elsewhere: Secondary | ICD-10-CM | POA: Diagnosis not present

## 2017-05-22 DIAGNOSIS — N2581 Secondary hyperparathyroidism of renal origin: Secondary | ICD-10-CM | POA: Diagnosis not present

## 2017-05-22 DIAGNOSIS — D631 Anemia in chronic kidney disease: Secondary | ICD-10-CM | POA: Diagnosis not present

## 2017-05-22 DIAGNOSIS — T8249XD Other complication of vascular dialysis catheter, subsequent encounter: Secondary | ICD-10-CM | POA: Diagnosis not present

## 2017-05-25 ENCOUNTER — Emergency Department (HOSPITAL_COMMUNITY): Payer: Medicare Other

## 2017-05-25 ENCOUNTER — Encounter (HOSPITAL_COMMUNITY): Payer: Self-pay | Admitting: Emergency Medicine

## 2017-05-25 ENCOUNTER — Observation Stay (HOSPITAL_COMMUNITY)
Admission: EM | Admit: 2017-05-25 | Discharge: 2017-05-26 | Disposition: A | Discharge status: Home / Self Care | Payer: Medicare Other | Attending: Internal Medicine | Admitting: Internal Medicine

## 2017-05-25 DIAGNOSIS — N2581 Secondary hyperparathyroidism of renal origin: Secondary | ICD-10-CM | POA: Diagnosis not present

## 2017-05-25 DIAGNOSIS — E1165 Type 2 diabetes mellitus with hyperglycemia: Secondary | ICD-10-CM | POA: Insufficient documentation

## 2017-05-25 DIAGNOSIS — E1122 Type 2 diabetes mellitus with diabetic chronic kidney disease: Secondary | ICD-10-CM | POA: Insufficient documentation

## 2017-05-25 DIAGNOSIS — I517 Cardiomegaly: Secondary | ICD-10-CM | POA: Diagnosis not present

## 2017-05-25 DIAGNOSIS — F419 Anxiety disorder, unspecified: Secondary | ICD-10-CM | POA: Diagnosis not present

## 2017-05-25 DIAGNOSIS — B958 Unspecified staphylococcus as the cause of diseases classified elsewhere: Secondary | ICD-10-CM | POA: Diagnosis not present

## 2017-05-25 DIAGNOSIS — Z86718 Personal history of other venous thrombosis and embolism: Secondary | ICD-10-CM | POA: Diagnosis not present

## 2017-05-25 DIAGNOSIS — G4733 Obstructive sleep apnea (adult) (pediatric): Secondary | ICD-10-CM | POA: Diagnosis present

## 2017-05-25 DIAGNOSIS — Z992 Dependence on renal dialysis: Secondary | ICD-10-CM | POA: Diagnosis not present

## 2017-05-25 DIAGNOSIS — N179 Acute kidney failure, unspecified: Secondary | ICD-10-CM

## 2017-05-25 DIAGNOSIS — IMO0002 Reserved for concepts with insufficient information to code with codable children: Secondary | ICD-10-CM | POA: Diagnosis present

## 2017-05-25 DIAGNOSIS — Z7901 Long term (current) use of anticoagulants: Secondary | ICD-10-CM | POA: Diagnosis not present

## 2017-05-25 DIAGNOSIS — I120 Hypertensive chronic kidney disease with stage 5 chronic kidney disease or end stage renal disease: Secondary | ICD-10-CM | POA: Insufficient documentation

## 2017-05-25 DIAGNOSIS — R0602 Shortness of breath: Secondary | ICD-10-CM | POA: Diagnosis present

## 2017-05-25 DIAGNOSIS — Z79899 Other long term (current) drug therapy: Secondary | ICD-10-CM | POA: Insufficient documentation

## 2017-05-25 DIAGNOSIS — K861 Other chronic pancreatitis: Secondary | ICD-10-CM | POA: Diagnosis not present

## 2017-05-25 DIAGNOSIS — Z794 Long term (current) use of insulin: Secondary | ICD-10-CM | POA: Diagnosis not present

## 2017-05-25 DIAGNOSIS — N186 End stage renal disease: Principal | ICD-10-CM | POA: Insufficient documentation

## 2017-05-25 DIAGNOSIS — Z6841 Body Mass Index (BMI) 40.0 and over, adult: Secondary | ICD-10-CM | POA: Insufficient documentation

## 2017-05-25 DIAGNOSIS — B957 Other staphylococcus as the cause of diseases classified elsewhere: Secondary | ICD-10-CM

## 2017-05-25 DIAGNOSIS — D649 Anemia, unspecified: Secondary | ICD-10-CM | POA: Diagnosis not present

## 2017-05-25 DIAGNOSIS — K219 Gastro-esophageal reflux disease without esophagitis: Secondary | ICD-10-CM | POA: Diagnosis not present

## 2017-05-25 DIAGNOSIS — T8249XD Other complication of vascular dialysis catheter, subsequent encounter: Secondary | ICD-10-CM | POA: Diagnosis not present

## 2017-05-25 DIAGNOSIS — R509 Fever, unspecified: Secondary | ICD-10-CM | POA: Diagnosis not present

## 2017-05-25 DIAGNOSIS — M109 Gout, unspecified: Secondary | ICD-10-CM | POA: Insufficient documentation

## 2017-05-25 DIAGNOSIS — R7881 Bacteremia: Secondary | ICD-10-CM

## 2017-05-25 DIAGNOSIS — A419 Sepsis, unspecified organism: Secondary | ICD-10-CM | POA: Diagnosis present

## 2017-05-25 DIAGNOSIS — D631 Anemia in chronic kidney disease: Secondary | ICD-10-CM | POA: Diagnosis not present

## 2017-05-25 DIAGNOSIS — R0600 Dyspnea, unspecified: Secondary | ICD-10-CM | POA: Diagnosis not present

## 2017-05-25 LAB — COMPREHENSIVE METABOLIC PANEL
ALT: 24 U/L (ref 17–63)
AST: 26 U/L (ref 15–41)
Albumin: 3 g/dL — ABNORMAL LOW (ref 3.5–5.0)
Alkaline Phosphatase: 131 U/L — ABNORMAL HIGH (ref 38–126)
Anion gap: 15 (ref 5–15)
BUN: 92 mg/dL — ABNORMAL HIGH (ref 6–20)
CO2: 23 mmol/L (ref 22–32)
Calcium: 8.6 mg/dL — ABNORMAL LOW (ref 8.9–10.3)
Chloride: 97 mmol/L — ABNORMAL LOW (ref 101–111)
Creatinine, Ser: 15.29 mg/dL — ABNORMAL HIGH (ref 0.61–1.24)
GFR calc Af Amer: 4 mL/min — ABNORMAL LOW (ref >60)
GFR calc non Af Amer: 3 mL/min — ABNORMAL LOW (ref >60)
Glucose, Bld: 156 mg/dL — ABNORMAL HIGH (ref 65–99)
Potassium: 4.3 mmol/L (ref 3.5–5.1)
Sodium: 135 mmol/L (ref 135–145)
Total Bilirubin: 0.6 mg/dL (ref 0.3–1.2)
Total Protein: 8.2 g/dL — ABNORMAL HIGH (ref 6.5–8.1)

## 2017-05-25 LAB — CBC
HCT: 24.7 % — ABNORMAL LOW (ref 39.0–52.0)
Hemoglobin: 7.6 g/dL — ABNORMAL LOW (ref 13.0–17.0)
MCH: 29.2 pg (ref 26.0–34.0)
MCHC: 30.8 g/dL (ref 30.0–36.0)
MCV: 95 fL (ref 78.0–100.0)
Platelets: 341 10*3/uL (ref 150–400)
RBC: 2.6 MIL/uL — ABNORMAL LOW (ref 4.22–5.81)
RDW: 19 % — ABNORMAL HIGH (ref 11.5–15.5)
WBC: 8.9 10*3/uL (ref 4.0–10.5)

## 2017-05-25 LAB — PROTIME-INR
INR: 1.48
Prothrombin Time: 18 seconds — ABNORMAL HIGH (ref 11.4–15.2)

## 2017-05-25 LAB — HEMOGLOBIN A1C
Hgb A1c MFr Bld: 5.9 % — ABNORMAL HIGH (ref 4.8–5.6)
MEAN PLASMA GLUCOSE: 122.63 mg/dL

## 2017-05-25 LAB — LACTIC ACID, PLASMA
Lactic Acid, Venous: 0.8 mmol/L (ref 0.5–1.9)
Lactic Acid, Venous: 1.2 mmol/L (ref 0.5–1.9)

## 2017-05-25 LAB — PREPARE RBC (CROSSMATCH)

## 2017-05-25 MED ORDER — ZOLPIDEM TARTRATE 5 MG PO TABS: 5.0000 mg | ORAL_TABLET | Freq: Every evening | ORAL | Status: DC | PRN

## 2017-05-25 MED ORDER — CINACALCET HCL 30 MG PO TABS: 60.0000 mg | ORAL_TABLET | ORAL | Status: DC

## 2017-05-25 MED ORDER — NORTRIPTYLINE HCL 10 MG PO CAPS
10.0000 mg | ORAL_CAPSULE | Freq: Every day | ORAL | Status: DC
  Filled 2017-05-25: qty 1

## 2017-05-25 MED ORDER — SORBITOL 70 % SOLN: 30.0000 mL | Status: DC | PRN

## 2017-05-25 MED ORDER — PENTAFLUOROPROP-TETRAFLUOROETH EX AERO: 1.0000 "application " | INHALATION_SPRAY | CUTANEOUS | Status: DC | PRN

## 2017-05-25 MED ORDER — CAMPHOR-MENTHOL 0.5-0.5 % EX LOTN
1.0000 "application " | TOPICAL_LOTION | Freq: Three times a day (TID) | CUTANEOUS | Status: DC | PRN
  Filled 2017-05-25: qty 222

## 2017-05-25 MED ORDER — CALCIUM CARBONATE ANTACID 1250 MG/5ML PO SUSP: 500.0000 mg | Freq: Four times a day (QID) | ORAL | Status: DC | PRN

## 2017-05-25 MED ORDER — INSULIN DETEMIR 100 UNIT/ML ~~LOC~~ SOLN
70.0000 [IU] | Freq: Every day | SUBCUTANEOUS | Status: DC
  Filled 2017-05-25 (×2): qty 0.7

## 2017-05-25 MED ORDER — LANTHANUM CARBONATE 500 MG PO CHEW
1000.0000 mg | CHEWABLE_TABLET | Freq: Three times a day (TID) | ORAL | Status: DC
  Administered 2017-05-26: 1000 mg via ORAL
  Filled 2017-05-25: qty 2

## 2017-05-25 MED ORDER — SODIUM CHLORIDE 0.9 % IV SOLN: Freq: Once | INTRAVENOUS | Status: DC

## 2017-05-25 MED ORDER — ACETAMINOPHEN 325 MG PO TABS: 650.0000 mg | ORAL_TABLET | Freq: Four times a day (QID) | ORAL | Status: DC | PRN

## 2017-05-25 MED ORDER — LIDOCAINE-PRILOCAINE 2.5-2.5 % EX CREA: 1.0000 "application " | TOPICAL_CREAM | CUTANEOUS | Status: DC | PRN

## 2017-05-25 MED ORDER — TETRAHYDROZOLINE HCL 0.05 % OP SOLN: 1.0000 [drp] | Freq: Every day | OPHTHALMIC | Status: DC | PRN

## 2017-05-25 MED ORDER — CLONAZEPAM 1 MG PO TABS: 2.0000 mg | ORAL_TABLET | Freq: Every evening | ORAL | Status: DC

## 2017-05-25 MED ORDER — SODIUM CHLORIDE 0.9 % IV SOLN: 100.0000 mL | INTRAVENOUS | Status: DC | PRN

## 2017-05-25 MED ORDER — DOCUSATE SODIUM 283 MG RE ENEM
1.0000 | ENEMA | RECTAL | Status: DC | PRN
Start: 2017-05-25 — End: 2017-05-26
  Filled 2017-05-25: qty 1

## 2017-05-25 MED ORDER — ONDANSETRON HCL 4 MG/2ML IJ SOLN: 4.0000 mg | Freq: Four times a day (QID) | INTRAMUSCULAR | Status: DC | PRN

## 2017-05-25 MED ORDER — OXYCODONE-ACETAMINOPHEN 5-325 MG PO TABS: 1.0000 | ORAL_TABLET | ORAL | Status: DC | PRN

## 2017-05-25 MED ORDER — WARFARIN - PHARMACIST DOSING INPATIENT: Freq: Every day | Status: DC

## 2017-05-25 MED ORDER — HEPARIN SODIUM (PORCINE) 1000 UNIT/ML DIALYSIS: 1000.0000 [IU] | INTRAMUSCULAR | Status: DC | PRN

## 2017-05-25 MED ORDER — INSULIN ASPART 100 UNIT/ML ~~LOC~~ SOLN: 35.0000 [IU] | Freq: Three times a day (TID) | SUBCUTANEOUS | Status: DC

## 2017-05-25 MED ORDER — WARFARIN SODIUM 5 MG PO TABS
5.0000 mg | ORAL_TABLET | Freq: Every day | ORAL | Status: DC
  Filled 2017-05-25: qty 1

## 2017-05-25 MED ORDER — ONDANSETRON HCL 4 MG PO TABS: 4.0000 mg | ORAL_TABLET | Freq: Four times a day (QID) | ORAL | Status: DC | PRN

## 2017-05-25 MED ORDER — LIDOCAINE HCL (PF) 1 % IJ SOLN: 5.0000 mL | INTRAMUSCULAR | Status: DC | PRN

## 2017-05-25 MED ORDER — SODIUM CHLORIDE 0.9% FLUSH
3.0000 mL | Freq: Two times a day (BID) | INTRAVENOUS | Status: DC
  Administered 2017-05-26 (×2): 3 mL via INTRAVENOUS

## 2017-05-25 MED ORDER — CALCIUM ACETATE (PHOS BINDER) 667 MG PO CAPS
2001.0000 mg | ORAL_CAPSULE | Freq: Three times a day (TID) | ORAL | Status: DC
  Administered 2017-05-26: 2001 mg via ORAL
  Filled 2017-05-25: qty 3

## 2017-05-25 MED ORDER — TRAMADOL HCL 50 MG PO TABS: 50.0000 mg | ORAL_TABLET | Freq: Four times a day (QID) | ORAL | Status: DC | PRN

## 2017-05-25 MED ORDER — ACETAMINOPHEN 650 MG RE SUPP: 650.0000 mg | Freq: Four times a day (QID) | RECTAL | Status: DC | PRN

## 2017-05-25 MED ORDER — HYDROCODONE-ACETAMINOPHEN 5-325 MG PO TABS: 1.0000 | ORAL_TABLET | Freq: Two times a day (BID) | ORAL | Status: DC | PRN

## 2017-05-25 MED ORDER — GABAPENTIN 300 MG PO CAPS: 300.0000 mg | ORAL_CAPSULE | Freq: Three times a day (TID) | ORAL | Status: DC | PRN

## 2017-05-25 MED ORDER — PANTOPRAZOLE SODIUM 40 MG PO TBEC: 40.0000 mg | DELAYED_RELEASE_TABLET | Freq: Every day | ORAL | Status: DC

## 2017-05-25 MED ORDER — HYDROXYZINE HCL 25 MG PO TABS: 25.0000 mg | ORAL_TABLET | Freq: Three times a day (TID) | ORAL | Status: DC | PRN

## 2017-05-25 MED ORDER — ALLOPURINOL 300 MG PO TABS: 600.0000 mg | ORAL_TABLET | Freq: Every day | ORAL | Status: DC

## 2017-05-25 MED ORDER — NEPRO/CARBSTEADY PO LIQD
237.0000 mL | Freq: Three times a day (TID) | ORAL | Status: DC | PRN
  Filled 2017-05-25: qty 237

## 2017-05-25 MED ORDER — DOCUSATE SODIUM 100 MG PO CAPS
100.0000 mg | ORAL_CAPSULE | Freq: Two times a day (BID) | ORAL | Status: DC
  Administered 2017-05-26: 100 mg via ORAL
  Filled 2017-05-25: qty 1

## 2017-05-25 MED ORDER — RENA-VITE PO TABS: 1.0000 | ORAL_TABLET | Freq: Every day | ORAL | Status: DC

## 2017-05-25 MED ORDER — COLCHICINE 0.6 MG PO TABS: 1.2000 mg | ORAL_TABLET | Freq: Every day | ORAL | Status: DC

## 2017-05-25 MED ORDER — ALTEPLASE 2 MG IJ SOLR: 2.0000 mg | Freq: Once | INTRAMUSCULAR | Status: DC | PRN

## 2017-05-25 MED ORDER — HEPARIN SODIUM (PORCINE) 1000 UNIT/ML DIALYSIS
6000.0000 [IU] | Freq: Once | INTRAMUSCULAR | Status: DC
  Filled 2017-05-25: qty 6

## 2017-05-25 MED ORDER — INSULIN ASPART 100 UNIT/ML ~~LOC~~ SOLN: 0.0000 [IU] | Freq: Every day | SUBCUTANEOUS | Status: DC

## 2017-05-25 MED ORDER — INSULIN ASPART 100 UNIT/ML ~~LOC~~ SOLN
0.0000 [IU] | Freq: Three times a day (TID) | SUBCUTANEOUS | Status: DC
Start: 2017-05-26 — End: 2017-05-26
  Administered 2017-05-26: 4 [IU] via SUBCUTANEOUS
  Administered 2017-05-26: 7 [IU] via SUBCUTANEOUS

## 2017-05-25 NOTE — ED Notes (Signed)
Pt clotted per lab will redraw

## 2017-05-25 NOTE — ED Triage Notes (Signed)
Pt sent here from dialysis for issues with dialysis cath in left upper leg and low hgb

## 2017-05-25 NOTE — ED Notes (Signed)
Pt is febrile on vitals check for blood transfusion and pt is increasingly short of breath. edp notified. Told to hold off on transfusing blood.

## 2017-05-25 NOTE — ED Provider Notes (Signed)
[facility] DEPT Provider Note   CSN: [id number] Arrival date & time: 05/25/17  1315     History   Chief Complaint Chief Complaint  Patient presents with  . Abnormal Lab  . Vascular Access Problem    HPI Shane Hamilton is a 51 y.o. male.  HPI   51yM sent from dialysis because catheter nonfunctional. Was not dialyzed today. C/o generalized weakness for several day. No acute pain. No acute respiratory complaints. Subjective fever and chills. Doesn't make urine.   Past Medical History:  Diagnosis Date  . Abscess of right arm 06/17/2015  . Anemia   . Anxiety   . Arteriovenous fistula infection (HCC)   . Cardiomegaly   . Chronic anticoagulation   . Chronic pancreatitis (HCC)   . DVT (deep venous thrombosis) (HCC) 2011  . ESRD (end stage renal disease) (HCC)    [facility] [place]  . GERD (gastroesophageal reflux disease)   . Gout   . Headache    migraines  . History of kidney stones   . Hypertension   . Kidney stones   . LOC (loss of consciousness) (HCC)   . Morbid obesity (HCC)   . OSA (obstructive sleep apnea)    wears CPAP  . Pneumonia   . Secondary hyperparathyroidism (HCC)   . Seizures (HCC)    pt denies  . Septic shock(785.52)   . Shortness of breath dyspnea    with exertion  . Type 2 diabetes mellitus, uncontrolled (HCC)    Type 2    Patient Active Problem List   Diagnosis Date Noted  . ESRD (end stage renal disease) (HCC) 01/15/2017  . Diabetic polyneuropathy associated with diabetes mellitus due to underlying condition (HCC) 09/09/2016  . NSVT (nonsustained ventricular tachycardia) (HCC) 03/29/2016  . Bacteremia 03/27/2016  . Fever 03/26/2016  . Sepsis (HCC) 03/26/2016  . End-stage renal disease on hemodialysis (HCC)   . Gout 03/04/2016  . Atherosclerosis of native arteries of extremity with intermittent claudication (HCC) 10/11/2015  . AV fistula infection (HCC) 06/18/2015  . DVT (deep venous thrombosis) (HCC) 06/17/2015  . Chronic  anticoagulation   . Type 2 diabetes mellitus, uncontrolled (HCC)   . Morbid obesity (HCC)   . Seizures (HCC)   . Obstructive sleep apnea 08/06/2011    Past Surgical History:  Procedure Laterality Date  . AMPUTATION FINGER / THUMB Right   . ANGIOPLASTY ILLIAC ARTERY Right 05/15/2016   Procedure: FIBRIN SHEATH ANGIOPLASTY;  Surgeon: Larina Earthly, MD;  Location: [facility] OR;  Service: Vascular;  Laterality: Right;  . APPLICATION OF WOUND VAC Left 06/20/2015   Procedure: APPLICATION OF WOUND VAC;  Surgeon: Renford Dills, MD;  Location: [facility] ORS;  Service: Vascular;  Laterality: Left;  . APPLICATION OF WOUND VAC Left 01/15/2017   AV GRAFT  . AV FISTULA PLACEMENT    . AV FISTULA PLACEMENT Left 06/20/2015   Procedure: ARTERIOVENOUS (AV) FISTULA  ligation , excision;  Surgeon: Renford Dills, MD;  Location: [facility] ORS;  Service: Vascular;  Laterality: Left;  . AV FISTULA PLACEMENT Left 12/04/2016   Procedure: EXPLORATION LEFT UPPER ARM;  Surgeon: Chuck Hint, MD;  Location: [facility] OR;  Service: Vascular;  Laterality: Left;  . AV FISTULA PLACEMENT Left 01/15/2017   Procedure: INSERTION OF ARTERIOVENOUS (AV) GORE-TEX STRETCH GRAFT THIGH;  Surgeon: Chuck Hint, MD;  Location: [facility] OR;  Service: Vascular;  Laterality: Left;  . EXCHANGE OF A DIALYSIS CATHETER Left 11/13/2015   Procedure: EXCHANGE OF A DIALYSIS  CATHETER- LEFT INTERNAL JUGULAR;  Surgeon: Nada Libman, MD;  Location: [facility] OR;  Service: Vascular;  Laterality: Left;  . EXCHANGE OF A DIALYSIS CATHETER Right 05/15/2016   Procedure: EXCHANGE OF A RIGHT FEMORAL DIALYSIS CATHETER;  Surgeon: Larina Earthly, MD;  Location: [facility] OR;  Service: Vascular;  Laterality: Right;  . I&D EXTREMITY Left 06/20/2015   Procedure: IRRIGATION AND DEBRIDEMENT EXTREMITY;  Surgeon: Renford Dills, MD;  Location: [facility] ORS;  Service: Vascular;  Laterality: Left;  . INSERTION OF DIALYSIS CATHETER N/A 06/25/2015   Procedure: place a tunnelled catheter for long term  use;  Surgeon: Annice Needy, MD;  Location: [facility] ORS;  Service: Vascular;  Laterality: N/A;  . INSERTION OF DIALYSIS CATHETER Left 07/25/2015   Procedure: INSERTION OF LEFT INTERNAL JUGULAR DIALYSIS CATHETER;  Surgeon: Pryor Ochoa, MD;  Location: [facility] OR;  Service: Vascular;  Laterality: Left;  . INSERTION OF DIALYSIS CATHETER Left 08/01/2015   Procedure: REMOVAL OF DIALYSIS CATHETER; PLACEMNET OF NEW DIALYSIS CATHETER;  Surgeon: Pryor Ochoa, MD;  Location: [facility] OR;  Service: Vascular;  Laterality: Left;  . INSERTION OF DIALYSIS CATHETER Right 03/31/2016   Procedure: INSERTION OF DIALYSIS CATHETER RIGHT FEMORAL ;  Surgeon: Chuck Hint, MD;  Location: [facility] OR;  Service: Vascular;  Laterality: Right;  . IR FLUORO GUIDE CV LINE RIGHT  01/20/2017  . IR FLUORO GUIDE CV LINE RIGHT  01/27/2017  . IR FLUORO GUIDE CV LINE RIGHT  02/03/2017  . IR GENERIC HISTORICAL  05/28/2016   IR FLUORO GUIDE CV LINE RIGHT 05/28/2016 Berdine Dance, MD [facility]  . IR GENERIC HISTORICAL  06/11/2016   IR FLUORO GUIDE CV LINE RIGHT 06/11/2016 [facility]  . IR THROMBECTOMY AV FISTULA W/THROMBOLYSIS/PTA INC/SHUNT/IMG LEFT Left 02/22/2017  . IR US GUIDE VASC ACCESS LEFT  02/22/2017  . IR VENOCAVAGRAM IVC  02/03/2017  . PERIPHERAL VASCULAR CATHETERIZATION N/A 06/25/2015   Procedure: Dialysis/Perma Catheter Insertion;  Surgeon: Annice Needy, MD;  Location: [facility] INVASIVE CV LAB;  Service: Cardiovascular;  Laterality: N/A;  . PERIPHERAL VASCULAR CATHETERIZATION Bilateral 09/13/2015   Procedure: Upper Extremity Venography;  Surgeon: Sherren Kerns, MD;  Location: [facility] INVASIVE CV LAB;  Service: Cardiovascular;  Laterality: Bilateral;  . THROMBECTOMY AND REVISION OF ARTERIOVENTOUS (AV) GORETEX  GRAFT Left 03/05/2017   Procedure: THROMBECTOMY AND REVISION OF ARTERIOVENTOUS (AV) GORETEX  GRAFT LEFT THIGH;  Surgeon: Chuck Hint, MD;  Location: [facility] OR;  Service: Vascular;  Laterality: Left;       Home Medications     Prior to Admission medications   Medication Sig Start Date End Date Taking? Authorizing Provider  allopurinol (ZYLOPRIM) 300 MG tablet Take 600 mg by mouth daily.     [provider]  calcium acetate (PHOSLO) 667 MG capsule Take 2,001 mg by mouth 3 (three) times daily with meals. Also with snacks    [provider]  cinacalcet (SENSIPAR) 60 MG tablet Take 60 mg by mouth See admin instructions. Take 60 mg by mouth on Tuesday and Saturday    [provider]  clonazePAM (KLONOPIN) 1 MG tablet Take 1 tablet (1 mg total) by mouth 2 (two) times daily as needed for anxiety. Patient taking differently: Take 2 mg by mouth every evening.  12/04/16   Raymond Gurney, PA-C  colchicine 0.6 MG tablet Take 1.2 mg by mouth daily.     [provider]  gabapentin (NEURONTIN) 300 MG capsule Take 1 capsule (300 mg total) by mouth  3 (three) times daily as needed (pain). 12/04/16   Raymond Gurney, PA-C  HYDROcodone-acetaminophen (NORCO/VICODIN) 5-325 MG tablet Take 1-2 tablets by mouth every 12 (twelve) hours as needed for severe pain. 04/14/17   Russo, [place] N, PA-C  ibuprofen (ADVIL,MOTRIN) 800 MG tablet Take 800 mg by mouth every 8 (eight) hours as needed for moderate pain.    [provider]  lanthanum (FOSRENOL) 1000 MG chewable tablet Chew 1,000 mg by mouth 3 (three) times daily with meals. 01/27/17   [provider]  LEVEMIR FLEXTOUCH 100 UNIT/ML Pen INJECT 70 UNITS UNDER THE SKIN QHS 08/06/16   [provider]  lidocaine (XYLOCAINE) 5 % ointment Apply to feet twice daily as needed.  Use with gloves/cotton swab 09/09/16   Nita Sickle K, DO  multivitamin (RENA-VIT) TABS tablet Take 1 tablet by mouth daily.  12/05/14   [provider]  nortriptyline (PAMELOR) 10 MG capsule Take 1 capsule (10 mg total) by mouth at bedtime. 09/09/16   Patel, Donika K, DO  NOVOLOG FLEXPEN 100 UNIT/ML FlexPen Inject 35 Units as directed 3 (three) times daily with  meals. 04/20/16   [provider]  omeprazole (PRILOSEC) 40 MG capsule Take 40 mg by mouth daily 01/06/17   [provider]  oxyCODONE-acetaminophen (ROXICET) 5-325 MG tablet Take 1-2 tablets by mouth every 4 (four) hours as needed. 03/05/17   Chuck Hint, MD  Tetrahydrozoline HCl (VISINE OP) Place 1 drop into both eyes daily as needed (dry eyes).     [provider]  traMADol (ULTRAM) 50 MG tablet Take 1 tablet (50 mg total) by mouth every 6 (six) hours as needed for moderate pain. 01/15/17   Rhyne, Ames Coupe, PA-C  warfarin (COUMADIN) 5 MG tablet Take 5 mg by mouth daily 10/30/16   [provider]    Family History Family History  Problem Relation Age of Onset  . Diabetes Mother   . Hypertension Mother   . Diabetes Father   . Hypertension Father   . Hypertension Brother     Social History Social History  Substance Use Topics  . Smoking status: Never Smoker  . Smokeless tobacco: Never Used  . Alcohol use No     Allergies   Aspirin   Review of Systems Review of Systems  All systems reviewed and negative, other than as noted in HPI.  Physical Exam Updated Vital Signs BP (!) 184/110 (BP Location: Right Arm)   Pulse (!) 125   Temp 98.8 F (37.1 C) (Oral)   Resp (!) 24   SpO2 93%   Physical Exam  Constitutional:  Morbidly obese. Appears tired.   HENT:  Head: Normocephalic and atraumatic.  Eyes: Conjunctivae are normal. Right eye exhibits no discharge. Left eye exhibits no discharge.  Neck: Neck supple.  Cardiovascular: Normal rate, regular rhythm and normal heart sounds.  Exam reveals no gallop and no friction rub.   No murmur heard. Dialysis cathter R groin  Pulmonary/Chest: No respiratory distress.  Tachypnea. Decreased b/l  Abdominal: Soft. He exhibits no distension. There is no tenderness.  Musculoskeletal: He exhibits edema. He exhibits no tenderness.  Neurological: He is alert.  Skin: Skin is warm and dry.    Psychiatric: He has a normal mood and affect. His behavior is normal. Thought content normal.  Nursing note and vitals reviewed.    ED Treatments / Results  Labs (all labs ordered are listed, but only abnormal results are displayed) Labs Reviewed  COMPREHENSIVE METABOLIC PANEL -  Abnormal; Notable for the following:       Result Value   Chloride 97 (*)    Glucose, Bld 156 (*)    BUN 92 (*)    Creatinine, Ser 15.29 (*)    Calcium 8.6 (*)    Total Protein 8.2 (*)    Albumin 3.0 (*)    Alkaline Phosphatase 131 (*)    GFR calc non Af Amer 3 (*)    GFR calc Af Amer 4 (*)    All other components within normal limits  CBC - Abnormal; Notable for the following:    RBC 2.60 (*)    Hemoglobin 7.6 (*)    HCT 24.7 (*)    RDW 19.0 (*)    All other components within normal limits  OCCULT BLOOD X 1 CARD TO LAB, STOOL  PROTIME-INR  TYPE AND SCREEN  PREPARE RBC (CROSSMATCH)    EKG  EKG Interpretation None       Radiology Dg Chest Portable 1 View  Result Date: 05/25/2017 CLINICAL DATA:  Dyspnea after missing dialysis EXAM: PORTABLE CHEST 1 VIEW COMPARISON:  05/15/2016 FINDINGS: Lung volumes are low with mild prominence of the cardiac silhouette in part due to the portable technique and low lung volumes. Mild interstitial edema is suggested characterized by diffuse hazy increase in interstitial lung markings but without pneumonic consolidation, effusion or pneumothorax. No acute nor suspicious osseous abnormalities. IMPRESSION: Borderline cardiomegaly. Mild interstitial edema is suggested with low lung volumes. Electronically Signed   By: Tollie Eth M.D.   On: 05/25/2017 18:12    Procedures Procedures (including critical care time)  Medications Ordered in ED Medications  0.9 %  sodium chloride infusion (not administered)  0.9 %  sodium chloride infusion (not administered)     Initial Impression / Assessment and Plan / ED Course  I have reviewed the triage vital signs and the  nursing notes.  Pertinent labs & imaging results that were available during my care of the patient were reviewed by me and considered in my medical decision making (see chart for details).  51yM with generalized weakness since around Saturday. Initial impression that symptomatic anemia. 1u PRBS initially ordered. Seemed to have significant drop in H/H although Dr Ophelia Charter astutely pointed out to me that there is a large discrepancy between values on CBC and H/H only. Baseline hemoglobin probably in 809 range in ESRD pt.   Subsequently noted to be febrile in the ED. Infectious process could explain generalized weakness and the tachycardia. Source not clear. Bacteremia with HD? Doesn't make significant urine.   6:46 PM Discussed with nephrology, Dr Juel Burrow. Very familiar with patient. Going to be very difficult to get new access on him. Will try using current line tonight. If won't work then will need new line by [employer] tomorrow.   Final Clinical Impressions(s) / ED Diagnoses   Final diagnoses:  Anemia  Fever  New Prescriptions New Prescriptions   No medications on file     Raeford Razor, MD 06/11/17 1058

## 2017-05-25 NOTE — Consult Note (Signed)
Reason for Consult: ESRD Referring Physician: Dr. Thomes Dinning  Chief Complaint: Dyspneic  Assessment/Plan: 1. Dyspnea - certainly volume overloaded. According to the pt they couldn't run him bec of cath malfunction. Reviewing records at [place] he was only on for just over 2 hrs and likely cath not functioning as no volume was removed. His EDW is 181kg and he left at 185.9kg. He may have an element of bronchitis but no e/o infiltration on CXR.  2. ESRD - Plan on trying to HD thru the catheter tonight; if that does not work we will request a catheter exchange by [facility] in the AM. Dialysis access will be a challenge with this gentleman bec of multiple accesses in the past (including a thrombosed left fem graft) and his body habitus.  - Left thigh graft was placed 01/16/2107 with multiple declots including a surgical declot by Dr. Scot Dock on 03/05/2017 at which time the graft was noted to be poorly incorporated.  3. Renal osteodystrophy - will check a phos and manage PTH as outpt.  4. Anemia - Last Mircera on 8/16. Transfuse as needed. 5. OSA 6. DM 7. H/o DVT   HPI: JESHAWN MELUCCI is an 51 y.o. 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 He is a very difficult HD access pt followed by [facility]. He is ESRD TTS @ [place] with last full treatment on Sat and only received 2 hrs of treatment but according to the pt the rt fem catheter was not working well. His EDW is listed as 181kg but he left today at 185.9kg and is now p/w dyspnea. He also reports subj fever and chills with a cough productive of yellowish sputums which has been present for at least a month. He no longer voids and denies diarrhea, CP or abd pain.   ROS Pertinent items are noted in HPI.  Chemistry and CBC: Creatinine, Ser  Date/Time Value Ref Range Status  05/25/2017 01:48 PM 15.29 (H) 0.61 - 1.24 mg/dL Final   05/15/2016 08:46 AM 11.50 (H) 0.61 - 1.24 mg/dL Final  03/31/2016 04:15 PM 13.10 (H) 0.61 - 1.24 mg/dL Final  03/30/2016 09:06 AM 9.94 (H) 0.61 - 1.24 mg/dL Final  03/29/2016 04:44 AM 6.82 (H) 0.61 - 1.24 mg/dL Final  03/28/2016 05:46 AM 6.88 (H) 0.61 - 1.24 mg/dL Final  03/27/2016 03:15 AM 8.05 (H) 0.61 - 1.24 mg/dL Final    Comment:    DELTA CHECK NOTED  03/26/2016 12:35 PM 13.22 (H) 0.61 - 1.24 mg/dL Final  03/05/2016 05:21 AM 5.79 (H) 0.61 - 1.24 mg/dL Final    Comment:    DELTA CHECK NOTED  03/04/2016 03:41 PM 9.42 (H) 0.61 - 1.24 mg/dL Final  03/04/2016 08:51 AM 8.73 (H) 0.61 - 1.24 mg/dL Final  03/01/2016 04:34 PM 10.20 (H) 0.61 - 1.24 mg/dL Final  09/13/2015 06:19 AM 7.90 (H) 0.61 - 1.24 mg/dL Final  06/25/2015 05:19 PM 12.92 (H) 0.61 - 1.24 mg/dL Final  06/24/2015 10:30 AM 13.93 (H) 0.61 - 1.24 mg/dL Final  06/22/2015 04:47 AM 11.80 (H) 0.61 - 1.24 mg/dL Final  06/21/2015 05:48 AM 9.32 (H) 0.61 - 1.24 mg/dL Final  06/20/2015 02:12 AM 11.47 (H) 0.61 - 1.24 mg/dL Final  06/19/2015 06:22 AM 13.12 (H) 0.61 - 1.24 mg/dL Final  06/18/2015 02:20 PM 11.62 (H) 0.61 - 1.24 mg/dL Final  06/18/2015 04:18 AM 10.68 (H) 0.61 -  1.24 mg/dL Final  06/17/2015 01:30 PM 8.12 (H) 0.61 - 1.24 mg/dL Final  01/15/2015 08:00 AM 10.22 (H) 0.50 - 1.35 mg/dL Final  03/06/2014 09:55 AM 9.83 (H) 0.50 - 1.35 mg/dL Final  10/01/2013 07:00 AM 14.71 (H) 0.50 - 1.35 mg/dL Final  07/07/2010 05:38 PM 6.53 (H) 0.4 - 1.5 mg/dL Final  01/14/2010 05:26 AM 4.07 (H) 0.4 - 1.5 mg/dL Final  01/12/2010 04:48 AM 2.97 (H) 0.4 - 1.5 mg/dL Final  01/11/2010 07:43 AM 4.22 (H) 0.4 - 1.5 mg/dL Final  01/10/2010 06:15 AM 4.17 (H) 0.4 - 1.5 mg/dL Final  01/09/2010 10:13 AM 4.47 (H) 0.4 - 1.5 mg/dL Final  01/08/2010 03:40 AM 4.06 (H) 0.4 - 1.5 mg/dL Final  01/06/2010 11:33 PM 5.9 (H) 0.4 - 1.5 mg/dL Final  12/21/2009 05:56 AM 10.80 (H) 0.4 - 1.5 mg/dL Final  12/20/2009 05:35 AM 13.34 (H) 0.4 - 1.5 mg/dL Final  12/19/2009  06:45 AM 12.46 (H) 0.4 - 1.5 mg/dL Final  12/18/2009 03:51 AM 11.67 (H) 0.4 - 1.5 mg/dL Final  12/17/2009 11:27 AM 10.78 (H) 0.4 - 1.5 mg/dL Final  12/16/2009 10:22 AM 15.70 (H) 0.4 - 1.5 mg/dL Final  12/15/2009 06:37 AM 15.20 (H) 0.4 - 1.5 mg/dL Final  12/14/2009 04:26 AM 13.27 DELTA CHECK NOTED (H) 0.4 - 1.5 mg/dL Final  12/13/2009 07:49 AM 22.00 (H) 0.4 - 1.5 mg/dL Final  12/12/2009 04:58 AM 18.63 (H) 0.4 - 1.5 mg/dL Final  12/11/2009 04:15 AM 15.74 (H) 0.4 - 1.5 mg/dL Final  12/10/2009 05:15 AM 14.06 (H) 0.4 - 1.5 mg/dL Final  12/09/2009 06:10 AM 11.48 (H) 0.4 - 1.5 mg/dL Final  12/07/2009 12:10 PM 10.75 DELTA CHECK NOTED (H) 0.4 - 1.5 mg/dL Final  12/06/2009 04:15 AM 7.03 (H) 0.4 - 1.5 mg/dL Final  12/05/2009 06:15 AM 10.42 (H) 0.4 - 1.5 mg/dL Final  12/04/2009 05:00 AM 7.22 DELTA CHECK NOTED (H) 0.4 - 1.5 mg/dL Final  12/03/2009 04:45 AM 11.31 (H) 0.4 - 1.5 mg/dL Final  12/02/2009 03:40 AM 8.38 DELTA CHECK NOTED (H) 0.4 - 1.5 mg/dL Final    Recent Labs Lab 05/25/17 1348  NA 135  K 4.3  CL 97*  CO2 23  GLUCOSE 156*  BUN 92*  CREATININE 15.29*  CALCIUM 8.6*    Recent Labs Lab 05/25/17 1348  WBC 8.9  HGB 7.6*  HCT 24.7*  MCV 95.0  PLT 341   Liver Function Tests:  Recent Labs Lab 05/25/17 1348  AST 26  ALT 24  ALKPHOS 131*  BILITOT 0.6  PROT 8.2*  ALBUMIN 3.0*   No results for input(s): LIPASE, AMYLASE in the last 168 hours. No results for input(s): AMMONIA in the last 168 hours. Cardiac Enzymes: No results for input(s): CKTOTAL, CKMB, CKMBINDEX, TROPONINI in the last 168 hours. Iron Studies: No results for input(s): IRON, TIBC, TRANSFERRIN, FERRITIN in the last 72 hours. PT/INR: _0 (inr:5)  Xrays/Other Studies: ) Results for orders placed or performed during the hospital encounter of 05/25/17 (from the past 48 hour(s))  Type and screen [place]     Status: None (Preliminary result)   Collection Time: 05/25/17  1:29 PM   Result Value Ref Range   ABO/RH(D) A POS    Antibody Screen NEG    Sample Expiration 05/28/2017    Unit Number [id number]    Blood Component Type RED CELLS,LR    Unit division 00    Status of Unit ALLOCATED    Transfusion Status OK TO TRANSFUSE  Crossmatch Result Compatible   Comprehensive metabolic panel     Status: Abnormal   Collection Time: 05/25/17  1:48 PM  Result Value Ref Range   Sodium 135 135 - 145 mmol/L   Potassium 4.3 3.5 - 5.1 mmol/L   Chloride 97 (L) 101 - 111 mmol/L   CO2 23 22 - 32 mmol/L   Glucose, Bld 156 (H) 65 - 99 mg/dL   BUN 92 (H) 6 - 20 mg/dL   Creatinine, Ser 15.29 (H) 0.61 - 1.24 mg/dL   Calcium 8.6 (L) 8.9 - 10.3 mg/dL   Total Protein 8.2 (H) 6.5 - 8.1 g/dL   Albumin 3.0 (L) 3.5 - 5.0 g/dL   AST 26 15 - 41 U/L   ALT 24 17 - 63 U/L   Alkaline Phosphatase 131 (H) 38 - 126 U/L   Total Bilirubin 0.6 0.3 - 1.2 mg/dL   GFR calc non Af Amer 3 (L) >60 mL/min   GFR calc Af Amer 4 (L) >60 mL/min    Comment: (NOTE) The eGFR has been calculated using the CKD EPI equation. This calculation has not been validated in all clinical situations. eGFR's persistently <60 mL/min signify possible Chronic Kidney Disease.    Anion gap 15 5 - 15  CBC     Status: Abnormal   Collection Time: 05/25/17  1:48 PM  Result Value Ref Range   WBC 8.9 4.0 - 10.5 K/uL   RBC 2.60 (L) 4.22 - 5.81 MIL/uL   Hemoglobin 7.6 (L) 13.0 - 17.0 g/dL   HCT 24.7 (L) 39.0 - 52.0 %   MCV 95.0 78.0 - 100.0 fL   MCH 29.2 26.0 - 34.0 pg   MCHC 30.8 30.0 - 36.0 g/dL   RDW 19.0 (H) 11.5 - 15.5 %   Platelets 341 150 - 400 K/uL  Prepare RBC     Status: None   Collection Time: 05/25/17  6:30 PM  Result Value Ref Range   Order Confirmation ORDER PROCESSED BY BLOOD BANK   Lactic acid, plasma     Status: None   Collection Time: 05/25/17  6:51 PM  Result Value Ref Range   Lactic Acid, Venous 0.8 0.5 - 1.9 mmol/L  Protime-INR     Status: Abnormal   Collection Time: 05/25/17  6:57 PM   Result Value Ref Range   Prothrombin Time 18.0 (H) 11.4 - 15.2 seconds   INR 1.48    Dg Chest Portable 1 View  Result Date: 05/25/2017 CLINICAL DATA:  Dyspnea after missing dialysis EXAM: PORTABLE CHEST 1 VIEW COMPARISON:  05/15/2016 FINDINGS: Lung volumes are low with mild prominence of the cardiac silhouette in part due to the portable technique and low lung volumes. Mild interstitial edema is suggested characterized by diffuse hazy increase in interstitial lung markings but without pneumonic consolidation, effusion or pneumothorax. No acute nor suspicious osseous abnormalities. IMPRESSION: Borderline cardiomegaly. Mild interstitial edema is suggested with low lung volumes. Electronically Signed   By: Ashley Royalty M.D.   On: 05/25/2017 18:12    PMH:   Past Medical History:  Diagnosis Date  . Abscess of right arm 06/17/2015  . Anemia   . Anxiety   . Arteriovenous fistula infection ([place])   . Cardiomegaly   . Chronic anticoagulation   . Chronic pancreatitis ([place])   . DVT (deep venous thrombosis) ([place]) 2011  . ESRD (end stage renal disease) ([place])    [place]  . GERD (gastroesophageal reflux disease)   . Gout   .  Headache    migraines  . History of kidney stones   . Hypertension   . Kidney stones   . LOC (loss of consciousness) ([place])   . Morbid obesity ([place])   . OSA (obstructive sleep apnea)    wears CPAP  . Pneumonia   . Secondary hyperparathyroidism ([place])   . Septic shock(785.52)   . Shortness of breath dyspnea    with exertion  . Type 2 diabetes mellitus, uncontrolled (H[facility])    Type 2    PSH:   Past Surgical History:  Procedure Laterality Date  . AMPUTATION FINGER / THUMB Right   . ANGIOPLASTY ILLIAC ARTERY Right 05/15/2016   Procedure: FIBRIN SHEATH ANGIOPLASTY;  Surgeon: Rosetta Posner, MD;  Location: [facility];  Service: Vascular;  Laterality: Right;  . APPLICATION OF WOUND VAC Left 06/20/2015   Procedure: APPLICATION OF WOUND VAC;  Surgeon: Katha Cabal,  MD;  Location: [facility];  Service: Vascular;  Laterality: Left;  . APPLICATION OF WOUND VAC Left 01/15/2017   AV GRAFT  . AV FISTULA PLACEMENT    . AV FISTULA PLACEMENT Left 06/20/2015   Procedure: ARTERIOVENOUS (AV) FISTULA  ligation , excision;  Surgeon: Katha Cabal, MD;  Location: [facility];  Service: Vascular;  Laterality: Left;  . AV FISTULA PLACEMENT Left 12/04/2016   Procedure: EXPLORATION LEFT UPPER ARM;  Surgeon: Angelia Mould, MD;  Location: [place];  Service: Vascular;  Laterality: Left;  . AV FISTULA PLACEMENT Left 01/15/2017   Procedure: INSERTION OF ARTERIOVENOUS (AV) GORE-TEX STRETCH GRAFT THIGH;  Surgeon: Angelia Mould, MD;  Location: [place];  Service: Vascular;  Laterality: Left;  . EXCHANGE OF A DIALYSIS CATHETER Left 11/13/2015   Procedure: EXCHANGE OF A DIALYSIS CATHETER- LEFT INTERNAL JUGULAR;  Surgeon: Serafina Mitchell, MD;  Location: [place];  Service: Vascular;  Laterality: Left;  . EXCHANGE OF A DIALYSIS CATHETER Right 05/15/2016   Procedure: EXCHANGE OF A RIGHT FEMORAL DIALYSIS CATHETER;  Surgeon: Rosetta Posner, MD;  Location: [place];  Service: Vascular;  Laterality: Right;  . I&D EXTREMITY Left 06/20/2015   Procedure: IRRIGATION AND DEBRIDEMENT EXTREMITY;  Surgeon: Katha Cabal, MD;  Location: [facility];  Service: Vascular;  Laterality: Left;  . INSERTION OF DIALYSIS CATHETER N/A 06/25/2015   Procedure: place a tunnelled catheter for long term use;  Surgeon: Algernon Huxley, MD;  Location: [facility];  Service: Vascular;  Laterality: N/A;  . INSERTION OF DIALYSIS CATHETER Left 07/25/2015   Procedure: INSERTION OF LEFT INTERNAL JUGULAR DIALYSIS CATHETER;  Surgeon: Mal Misty, MD;  Location: [facility];  Service: Vascular;  Laterality: Left;  . INSERTION OF DIALYSIS CATHETER Left 08/01/2015   Procedure: REMOVAL OF DIALYSIS CATHETER; PLACEMNET OF NEW DIALYSIS CATHETER;  Surgeon: Mal Misty, MD;  Location: [place];  Service: Vascular;  Laterality: Left;  . INSERTION OF  DIALYSIS CATHETER Right 03/31/2016   Procedure: INSERTION OF DIALYSIS CATHETER RIGHT FEMORAL ;  Surgeon: Angelia Mould, MD;  Location: [place];  Service: Vascular;  Laterality: Right;  . IR FLUORO GUIDE CV LINE RIGHT  01/20/2017  . IR FLUORO GUIDE CV LINE RIGHT  01/27/2017  . IR FLUORO GUIDE CV LINE RIGHT  02/03/2017  . IR GENERIC HISTORICAL  05/28/2016   IR FLUORO GUIDE CV LINE RIGHT 05/28/2016 Greggory Keen, MD [facility]  . IR GENERIC HISTORICAL  06/11/2016   IR FLUORO GUIDE CV LINE RIGHT 06/11/2016 [facility]  . IR THROMBECTOMY AV FISTULA W/THROMBOLYSIS/PTA INC/SHUNT/IMG  LEFT Left 02/22/2017  . IR US GUIDE VASC ACCESS LEFT  02/22/2017  . IR VENOCAVAGRAM IVC  02/03/2017  . PERIPHERAL VASCULAR CATHETERIZATION N/A 06/25/2015   Procedure: Dialysis/Perma Catheter Insertion;  Surgeon: Algernon Huxley, MD;  Location: [place] [facility];  Service: Cardiovascular;  Laterality: N/A;  . PERIPHERAL VASCULAR CATHETERIZATION Bilateral 09/13/2015   Procedure: Upper Extremity Venography;  Surgeon: Elam Dutch, MD;  Location: [place] [facility];  Service: Cardiovascular;  Laterality: Bilateral;  . THROMBECTOMY AND REVISION OF ARTERIOVENTOUS (AV) GORETEX  GRAFT Left 03/05/2017   Procedure: THROMBECTOMY AND REVISION OF ARTERIOVENTOUS (AV) GORETEX  GRAFT LEFT THIGH;  Surgeon: Angelia Mould, MD;  Location: [place];  Service: Vascular;  Laterality: Left;    Allergies:  Allergies  Allergen Reactions  . Aspirin Other (See Comments)    GI upset     Medications:   Prior to Admission medications   Medication Sig Start Date End Date Taking? Authorizing Provider  allopurinol (ZYLOPRIM) 300 MG tablet Take 600 mg by mouth daily.     [provider]  calcium acetate (PHOSLO) 667 MG capsule Take 2,001 mg by mouth 3 (three) times daily with meals. Also with snacks    [provider]  cinacalcet (SENSIPAR) 60 MG tablet Take 60 mg by mouth See admin instructions. Take 60 mg by mouth on Tuesday  and Saturday    [provider]  clonazePAM (KLONOPIN) 1 MG tablet Take 1 tablet (1 mg total) by mouth 2 (two) times daily as needed for anxiety. Patient taking differently: Take 2 mg by mouth every evening.  12/04/16   Alvia Grove, PA-C  colchicine 0.6 MG tablet Take 1.2 mg by mouth daily.     [provider]  gabapentin (NEURONTIN) 300 MG capsule Take 1 capsule (300 mg total) by mouth 3 (three) times daily as needed (pain). 12/04/16   Alvia Grove, PA-C  HYDROcodone-acetaminophen (NORCO/VICODIN) 5-325 MG tablet Take 1-2 tablets by mouth every 12 (twelve) hours as needed for severe pain. 04/14/17   Russo, [place] N, PA-C  lanthanum (FOSRENOL) 1000 MG chewable tablet Chew 1,000 mg by mouth 3 (three) times daily with meals. 01/27/17   [provider]  LEVEMIR FLEXTOUCH 100 UNIT/ML Pen INJECT 70 UNITS UNDER THE SKIN QHS 08/06/16   [provider]  lidocaine (XYLOCAINE) 5 % ointment Apply to feet twice daily as needed.  Use with gloves/cotton swab 09/09/16   Narda Amber K, DO  multivitamin (RENA-VIT) TABS tablet Take 1 tablet by mouth daily.  12/05/14   [provider]  nortriptyline (PAMELOR) 10 MG capsule Take 1 capsule (10 mg total) by mouth at bedtime. 09/09/16   Patel, Donika K, DO  NOVOLOG FLEXPEN 100 UNIT/ML FlexPen Inject 35 Units as directed 3 (three) times daily with meals. 04/20/16   [provider]  omeprazole (PRILOSEC) 40 MG capsule Take 40 mg by mouth daily 01/06/17   [provider]  oxyCODONE-acetaminophen (ROXICET) 5-325 MG tablet Take 1-2 tablets by mouth every 4 (four) hours as needed. 03/05/17   Angelia Mould, MD  Tetrahydrozoline HCl (VISINE OP) Place 1 drop into both eyes daily as needed (dry eyes).     [provider]  traMADol (ULTRAM) 50 MG tablet Take 1 tablet (50 mg total) by mouth every 6 (six) hours as needed for moderate pain. 01/15/17   Rhyne, Hulen Shouts, PA-C  warfarin (COUMADIN) 5 MG tablet Take  5 mg by mouth daily 10/30/16   [provider]    Discontinued Meds:   Medications Discontinued During This Encounter  Medication Reason  . ibuprofen (ADVIL,MOTRIN) 800 MG tablet     Social History:  reports that he has never smoked. He has never used smokeless tobacco. He reports that he does not drink alcohol or use drugs.  Family History:   Family History  Problem Relation Age of Onset  . Diabetes Mother   . Hypertension Mother   . Diabetes Father   . Hypertension Father   . Hypertension Brother     Blood pressure 128/88, pulse (!) 129, temperature (!) 100.7 F (38.2 C), resp. rate (!) 21, SpO2 96 %. General appearance: alert, cooperative and appears stated age Head: Normocephalic, without obvious abnormality, atraumatic Eyes: negative Neck: no adenopathy, no carotid bruit, supple, symmetrical, trachea midline and thyroid not enlarged, symmetric, no tenderness/mass/nodules Back: symmetric, no curvature. ROM normal. No CVA tenderness. Resp: distant Chest wall: no tenderness Cardio: regular rate and rhythm, S1, S2 normal, no murmur, click, rub or gallop GI: soft, non-tender; bowel sounds normal; no masses,  no organomegaly and morbidly obese Extremities: edema 2+ Pulses: 2+ and symmetric Skin: Skin color, texture, turgor normal. No rashes or lesions Lymph nodes: Cervical, supraclavicular, and axillary nodes normal. Neurologic: Grossly normal       Tristram Milian, Hunt Oris, MD 05/25/2017, 8:19 PM

## 2017-05-25 NOTE — H&P (Addendum)
History and Physical    Shane Hamilton:[id number] DOB: 1966-05-28 DOA: 05/25/2017  PCP: Camille Bal, MD Consultants:  Dion Saucier - Orthopedics; podiatry - has not seen yet; scheduled to see pain doctor Patient coming from:  Home - lives with wife; NOK: wife, [phone]  Chief Complaint: weakness  HPI: Shane Hamilton is a 51 y.o. male with medical history significant of uncontrolled DM; ESRD on TTS HD; morbid obesity; OSA on CPAP; HTN; and DVT on anticoagulation presenting because recently he has been experiencing weakness, feels "tired and drainish" and doesn't have any strength.  He reports that "they" called and told him his hemoglobin was low.  He has been feeling weaker and weaker every day.  They told him this a couple of days ago.  He has not seen any blood in his stools.  Weakness has been happening the last couple of days.  Today he was very out if it, very weak.  He was supposed to be dialyzed today but they were unable to do it today due to a catheter issue - they used Cathflo Saturday and today but still not much success.  They also tried it Thursday and had trouble.  He had a half session on Saturday, Thursday was a full session but it was "so-so".     ED Course: Initial concern for symptomatic anemia - ordered 1u PRBC due to apparently significant decrease in H/H, but on review of prior CBC Hgb probably 8-9 range baseline and so not terribly different than prior.  Patient with fever in the ER with persistent tachycardia, uncertain source.  Patient discussed with nephrology due to inability to dialyze (catheter issue).  Dr. Juel Burrow knows patient well, will be difficult to obtain new access.  Will try dialyzing with current line tonight but if unsuccessful will need new line by IR tomorrow.  Review of Systems: As per HPI; otherwise review of systems reviewed and negative.   Ambulatory Status:  Ambulates with a wheelchair - his knee and hip have problems  Past Medical History:    Diagnosis Date  . Abscess of right arm 06/17/2015  . Anemia   . Anxiety   . Arteriovenous fistula infection (HCC)   . Cardiomegaly   . Chronic anticoagulation   . Chronic pancreatitis (HCC)   . DVT (deep venous thrombosis) (HCC) 2011  . ESRD (end stage renal disease) (HCC)    [facility] - [facility]  . GERD (gastroesophageal reflux disease)   . Gout   . Headache    migraines  . History of kidney stones   . Hypertension   . Kidney stones   . LOC (loss of consciousness) (HCC)   . Morbid obesity (HCC)   . OSA (obstructive sleep apnea)    wears CPAP  . Pneumonia   . Secondary hyperparathyroidism (HCC)   . Septic shock(785.52)   . Shortness of breath dyspnea    with exertion  . Type 2 diabetes mellitus, uncontrolled (HCC)    Type 2    Past Surgical History:  Procedure Laterality Date  . AMPUTATION FINGER / THUMB Right   . ANGIOPLASTY ILLIAC ARTERY Right 05/15/2016   Procedure: FIBRIN SHEATH ANGIOPLASTY;  Surgeon: Larina Earthly, MD;  Location: [facility] OR;  Service: Vascular;  Laterality: Right;  . APPLICATION OF WOUND VAC Left 06/20/2015   Procedure: APPLICATION OF WOUND VAC;  Surgeon: Renford Dills, MD;  Location: [facility] ORS;  Service: Vascular;  Laterality: Left;  . APPLICATION OF WOUND VAC Left 01/15/2017  AV GRAFT  . AV FISTULA PLACEMENT    . AV FISTULA PLACEMENT Left 06/20/2015   Procedure: ARTERIOVENOUS (AV) FISTULA  ligation , excision;  Surgeon: Renford Dills, MD;  Location: [facility] ORS;  Service: Vascular;  Laterality: Left;  . AV FISTULA PLACEMENT Left 12/04/2016   Procedure: EXPLORATION LEFT UPPER ARM;  Surgeon: Chuck Hint, MD;  Location: [facility] OR;  Service: Vascular;  Laterality: Left;  . AV FISTULA PLACEMENT Left 01/15/2017   Procedure: INSERTION OF ARTERIOVENOUS (AV) GORE-TEX STRETCH GRAFT THIGH;  Surgeon: Chuck Hint, MD;  Location: [facility] OR;  Service: Vascular;  Laterality: Left;  . EXCHANGE OF A DIALYSIS CATHETER Left 11/13/2015   Procedure: EXCHANGE OF  A DIALYSIS CATHETER- LEFT INTERNAL JUGULAR;  Surgeon: Nada Libman, MD;  Location: [facility] OR;  Service: Vascular;  Laterality: Left;  . EXCHANGE OF A DIALYSIS CATHETER Right 05/15/2016   Procedure: EXCHANGE OF A RIGHT FEMORAL DIALYSIS CATHETER;  Surgeon: Larina Earthly, MD;  Location: [facility] OR;  Service: Vascular;  Laterality: Right;  . I&D EXTREMITY Left 06/20/2015   Procedure: IRRIGATION AND DEBRIDEMENT EXTREMITY;  Surgeon: Renford Dills, MD;  Location: [facility] ORS;  Service: Vascular;  Laterality: Left;  . INSERTION OF DIALYSIS CATHETER N/A 06/25/2015   Procedure: place a tunnelled catheter for long term use;  Surgeon: Annice Needy, MD;  Location: [facility] ORS;  Service: Vascular;  Laterality: N/A;  . INSERTION OF DIALYSIS CATHETER Left 07/25/2015   Procedure: INSERTION OF LEFT INTERNAL JUGULAR DIALYSIS CATHETER;  Surgeon: Pryor Ochoa, MD;  Location: [facility] OR;  Service: Vascular;  Laterality: Left;  . INSERTION OF DIALYSIS CATHETER Left 08/01/2015   Procedure: REMOVAL OF DIALYSIS CATHETER; PLACEMNET OF NEW DIALYSIS CATHETER;  Surgeon: Pryor Ochoa, MD;  Location: [facility] OR;  Service: Vascular;  Laterality: Left;  . INSERTION OF DIALYSIS CATHETER Right 03/31/2016   Procedure: INSERTION OF DIALYSIS CATHETER RIGHT FEMORAL ;  Surgeon: Chuck Hint, MD;  Location: [facility] OR;  Service: Vascular;  Laterality: Right;  . IR FLUORO GUIDE CV LINE RIGHT  01/20/2017  . IR FLUORO GUIDE CV LINE RIGHT  01/27/2017  . IR FLUORO GUIDE CV LINE RIGHT  02/03/2017  . IR GENERIC HISTORICAL  05/28/2016   IR FLUORO GUIDE CV LINE RIGHT 05/28/2016 Berdine Dance, MD [facility]-INTERV RAD  . IR GENERIC HISTORICAL  06/11/2016   IR FLUORO GUIDE CV LINE RIGHT 06/11/2016 [facility]  . IR THROMBECTOMY AV FISTULA W/THROMBOLYSIS/PTA INC/SHUNT/IMG LEFT Left 02/22/2017  . IR US GUIDE VASC ACCESS LEFT  02/22/2017  . IR VENOCAVAGRAM IVC  02/03/2017  . PERIPHERAL VASCULAR CATHETERIZATION N/A 06/25/2015   Procedure: Dialysis/Perma Catheter Insertion;  Surgeon:  Annice Needy, MD;  Location: [facility] INVASIVE CV LAB;  Service: Cardiovascular;  Laterality: N/A;  . PERIPHERAL VASCULAR CATHETERIZATION Bilateral 09/13/2015   Procedure: Upper Extremity Venography;  Surgeon: Sherren Kerns, MD;  Location: [facility] INVASIVE CV LAB;  Service: Cardiovascular;  Laterality: Bilateral;  . THROMBECTOMY AND REVISION OF ARTERIOVENTOUS (AV) GORETEX  GRAFT Left 03/05/2017   Procedure: THROMBECTOMY AND REVISION OF ARTERIOVENTOUS (AV) GORETEX  GRAFT LEFT THIGH;  Surgeon: Chuck Hint, MD;  Location: [facility] OR;  Service: Vascular;  Laterality: Left;    Social History   Social History  . Marital status: Single    Spouse name: N/A  . Number of children: N/A  . Years of education: N/A   Occupational History  . disabled     Social History Main Topics  . Smoking status:  Never Smoker  . Smokeless tobacco: Never Used  . Alcohol use No  . Drug use: No  . Sexual activity: Not on file   Other Topics Concern  . Not on file   Social History Narrative   Lives with a friend in a one story home.  Has one child.  On disability.  Education: some college.     Allergies  Allergen Reactions  . Aspirin Other (See Comments)    GI upset     Family History  Problem Relation Age of Onset  . Diabetes Mother   . Hypertension Mother   . Diabetes Father   . Hypertension Father   . Hypertension Brother     Prior to Admission medications   Medication Sig Start Date End Date Taking? Authorizing Provider  allopurinol (ZYLOPRIM) 300 MG tablet Take 600 mg by mouth daily.     [provider]  calcium acetate (PHOSLO) 667 MG capsule Take 2,001 mg by mouth 3 (three) times daily with meals. Also with snacks    [provider]  cinacalcet (SENSIPAR) 60 MG tablet Take 60 mg by mouth See admin instructions. Take 60 mg by mouth on Tuesday and Saturday    [provider]  clonazePAM (KLONOPIN) 1 MG tablet Take 1 tablet (1 mg total) by mouth 2 (two) times daily as  needed for anxiety. Patient taking differently: Take 2 mg by mouth every evening.  12/04/16   Raymond Gurney, PA-C  colchicine 0.6 MG tablet Take 1.2 mg by mouth daily.     [provider]  gabapentin (NEURONTIN) 300 MG capsule Take 1 capsule (300 mg total) by mouth 3 (three) times daily as needed (pain). 12/04/16   Raymond Gurney, PA-C  HYDROcodone-acetaminophen (NORCO/VICODIN) 5-325 MG tablet Take 1-2 tablets by mouth every 12 (twelve) hours as needed for severe pain. 04/14/17   Russo, [place] N, PA-C  ibuprofen (ADVIL,MOTRIN) 800 MG tablet Take 800 mg by mouth every 8 (eight) hours as needed for moderate pain.    [provider]  lanthanum (FOSRENOL) 1000 MG chewable tablet Chew 1,000 mg by mouth 3 (three) times daily with meals. 01/27/17   [provider]  LEVEMIR FLEXTOUCH 100 UNIT/ML Pen INJECT 70 UNITS UNDER THE SKIN QHS 08/06/16   [provider]  lidocaine (XYLOCAINE) 5 % ointment Apply to feet twice daily as needed.  Use with gloves/cotton swab 09/09/16   Nita Sickle K, DO  multivitamin (RENA-VIT) TABS tablet Take 1 tablet by mouth daily.  12/05/14   [provider]  nortriptyline (PAMELOR) 10 MG capsule Take 1 capsule (10 mg total) by mouth at bedtime. 09/09/16   Patel, Donika K, DO  NOVOLOG FLEXPEN 100 UNIT/ML FlexPen Inject 35 Units as directed 3 (three) times daily with meals. 04/20/16   [provider]  omeprazole (PRILOSEC) 40 MG capsule Take 40 mg by mouth daily 01/06/17   [provider]  oxyCODONE-acetaminophen (ROXICET) 5-325 MG tablet Take 1-2 tablets by mouth every 4 (four) hours as needed. 03/05/17   Chuck Hint, MD  Tetrahydrozoline HCl (VISINE OP) Place 1 drop into both eyes daily as needed (dry eyes).     [provider]  traMADol (ULTRAM) 50 MG tablet Take 1 tablet (50 mg total) by mouth every 6 (six) hours as needed for moderate pain. 01/15/17   Rhyne, Ames Coupe, PA-C  warfarin (COUMADIN) 5 MG tablet  Take 5 mg by mouth daily 10/30/16   [provider]  Physical Exam: Vitals:   05/25/17 2126 05/25/17 2319 05/25/17 2333 05/26/17 0000  BP: (!) 157/91 (!) 147/74 (!) 165/92 (!) 178/75  Pulse: (!) 129 (!) 124 (!) 122 (!) 122  Resp: (!) 23 14    Temp: 97.6 F (36.4 C) 100.2 F (37.9 C)    TempSrc: Oral Oral    SpO2: 96% 97%    Weight:  (!) 183.7 kg (404 lb 15.8 oz)       General:  Appears extremely obese and uncomfortable Eyes:  PERRL, EOMI, normal lids, iris ENT:  grossly normal hearing, lips & tongue, mmm Neck:  no LAD, masses or thyromegaly; no carotid bruits Cardiovascular:  Tachycardia consistently in 120s, no m/r/g. 2+ LE edema.  Respiratory:   CTA bilaterally with no wheezes/rales/rhonchi.  Mildly increased respiratory effort. Abdomen: Very obese, NT Skin:  no rash or induration seen on limited exam Musculoskeletal:  grossly normal tone BUE/BLE, good ROM, no bony abnormality Lower extremity:  2+ LE edema.  Limited foot exam with no ulcerations.  2+ distal pulses. Psychiatric:  grossly normal mood and affect, speech fluent and appropriate, AOx3 Neurologic:  CN 2-12 grossly intact, moves all extremities in coordinated fashion, sensation intact    Radiological Exams on Admission: Dg Chest Portable 1 View  Result Date: 05/25/2017 CLINICAL DATA:  Dyspnea after missing dialysis EXAM: PORTABLE CHEST 1 VIEW COMPARISON:  05/15/2016 FINDINGS: Lung volumes are low with mild prominence of the cardiac silhouette in part due to the portable technique and low lung volumes. Mild interstitial edema is suggested characterized by diffuse hazy increase in interstitial lung markings but without pneumonic consolidation, effusion or pneumothorax. No acute nor suspicious osseous abnormalities. IMPRESSION: Borderline cardiomegaly. Mild interstitial edema is suggested with low lung volumes. Electronically Signed   By: Tollie Eth M.D.   On: 05/25/2017 18:12    EKG: Independently reviewed.   Sinus tachycardia with rate 126; RBBB and LAFB, rate related changes   Labs on Admission: I have personally reviewed the available labs and imaging studies at the time of the admission.  Pertinent labs:   Lactate 0.8, 1.2 INR 1.48 A1c 5.9 Glucose 156 BUN 92/Creatinine 15.29 Albumin 3.0 Hgb 7.6, baseline 8-9   Assessment/Plan Principal Problem:   End-stage renal disease on hemodialysis (HCC) Active Problems:   Obstructive sleep apnea   Chronic anticoagulation   Type 2 diabetes mellitus, uncontrolled (HCC)   Morbid obesity (HCC)   Sepsis (HCC)   Anemia due to end stage renal disease (HCC)   SOB (shortness of breath)   ESRD on HD -Patient presenting with generalized weakness and SOB that appears to be c/w volume overload in the setting of apparent catheter malfunction at recent dialysis sessions -While patient was initially thought to have symptomatic anemia, review of prior CBCs (rather than H/H, because there does appear to be a marked difference) shows baseline Hgb of roughly 8-9; as such, patient does not appear to be markedly anemia compared to his usual baseline. -According to nephrology, patient was on for just over 2 hours and no volume was removed; his EDW is 181 kg and he left at 185.9 kg -Plan is for HD through his existing catheter tonight if possible -If HD is ineffective, will request VIR catheter exchange tomorrow -Patient has h/o access issues due to vasculopathy as well as his body habitus  Sepsis -Patient had persistent tachycardia throughout our visit but he reported that this is usual for him -After our encounter, he had a fever to 100.7 -With fever  and tachycardia, there is suspicion for sepsis without known source -Sepsis protocol initiated; he will not be bolused due to ESRD and current volume overload -Uncertain source -Blood and urine cultures pending -Will admit with telemetry and continue to monitor -Treat with IV Vanc and Zosyn for undifferentiated  sepsis -Recent HIV negative -Will check lactate and add procalcitonin  DM -A1c 5.9 -Cover with resistant scale SSI -Continue Levemir  OSA -Continue CPAP -Patient does not know how setting so will use autopap/RT assistance  Anticoagulation -Patient with recurrent thrombosis of dialysis grafts -He is on Coumadin but is subtherapeutic -Will request pharmacy to dose Coumadin  Obesity -Patient is super morbidly obese -He is basically nonambulatory  -In conjunction with his other medical problems, his life expectancy is quite limited, unfortunately, and it seems unlikely that at this point he will be able to successfully lose weight given his progressive physical limitations. -Consider palliative care consult.  DVT prophylaxis: Coumadin Code Status:  Full - confirmed with patient Family Communication: None present  Disposition Plan:  Home once clinically improved Consults called: Nephrology  Admission status: It is my clinical opinion that referral for OBSERVATION is reasonable and necessary in this patient based on the above information provided. The aforementioned taken together are felt to place the patient at high risk for further clinical deterioration. However it is anticipated that the patient may be medically stable for discharge from the hospital within 24 to 48 hours. Note: Observation order was placed prior to development of fever and concern for possible sepsis.  While it is still possible that the patient will improve after HD and be ready for discharge prior to 2 MNs, the likelihood decreases with development of new problems.    Jonah Blue MD [facility]  If note is complete, please contact covering daytime or nighttime physician. [email or web address] Password [email or web address]  05/26/2017, 1:54 AM

## 2017-05-25 NOTE — Progress Notes (Signed)
ANTICOAGULATION CONSULT NOTE - Initial Consult  Pharmacy Consult for Warfarin Indication: DVT  Allergies  Allergen Reactions  . Aspirin Other (See Comments)    GI upset     Patient Measurements:    Vital Signs: Temp: 100.7 F (38.2 C) (08/21 1849) Temp Source: Oral (08/21 1329) BP: 154/89 (08/21 1900) Pulse Rate: 125 (08/21 1900)  Labs:  Recent Labs  05/25/17 1348 05/25/17 1857  HGB 7.6*  --   HCT 24.7*  --   PLT 341  --   LABPROT  --  18.0*  INR  --  1.48  CREATININE 15.29*  --     CrCl cannot be calculated (Unknown ideal weight.).   Medical History: Past Medical History:  Diagnosis Date  . Abscess of right arm 06/17/2015  . Anemia   . Anxiety   . Arteriovenous fistula infection (HCC)   . Cardiomegaly   . Chronic anticoagulation   . Chronic pancreatitis (HCC)   . DVT (deep venous thrombosis) (HCC) 2011  . ESRD (end stage renal disease) (HCC)    [facility]  . GERD (gastroesophageal reflux disease)   . Gout   . Headache    migraines  . History of kidney stones   . Hypertension   . Kidney stones   . LOC (loss of consciousness) (HCC)   . Morbid obesity (HCC)   . OSA (obstructive sleep apnea)    wears CPAP  . Pneumonia   . Secondary hyperparathyroidism (HCC)   . Septic shock(785.52)   . Shortness of breath dyspnea    with exertion  . Type 2 diabetes mellitus, uncontrolled (HCC)    Type 2    Medications:   (Not in a hospital admission)  Assessment: 51 YOM here with weakness. On coumadin at home for DVT. Pharmacy consulted to resume anticoagulation with Coumadin. Per patient, he takes Coumadin 5 mg daily. His last dose was yesterday. H/H low. Plt wnl. INR on admission is subtherapeutic at 1.48  Goal of Therapy:  INR 2-3 Monitor platelets by anticoagulation protocol: Yes   Plan:  -Warfarin 5 mg daily -Daily PT/INR  -Monitor for s/s of bleeding.   Vinnie Level, PharmD., BCPS Clinical Pharmacist Pager [phone]

## 2017-05-26 DIAGNOSIS — N186 End stage renal disease: Secondary | ICD-10-CM

## 2017-05-26 DIAGNOSIS — G4733 Obstructive sleep apnea (adult) (pediatric): Secondary | ICD-10-CM

## 2017-05-26 DIAGNOSIS — E1165 Type 2 diabetes mellitus with hyperglycemia: Secondary | ICD-10-CM

## 2017-05-26 DIAGNOSIS — E1122 Type 2 diabetes mellitus with diabetic chronic kidney disease: Secondary | ICD-10-CM | POA: Diagnosis not present

## 2017-05-26 DIAGNOSIS — Z7901 Long term (current) use of anticoagulants: Secondary | ICD-10-CM

## 2017-05-26 DIAGNOSIS — I132 Hypertensive heart and chronic kidney disease with heart failure and with stage 5 chronic kidney disease, or end stage renal disease: Secondary | ICD-10-CM | POA: Diagnosis not present

## 2017-05-26 DIAGNOSIS — Z794 Long term (current) use of insulin: Secondary | ICD-10-CM

## 2017-05-26 DIAGNOSIS — A419 Sepsis, unspecified organism: Secondary | ICD-10-CM

## 2017-05-26 DIAGNOSIS — R7881 Bacteremia: Secondary | ICD-10-CM | POA: Diagnosis not present

## 2017-05-26 DIAGNOSIS — Z992 Dependence on renal dialysis: Secondary | ICD-10-CM

## 2017-05-26 DIAGNOSIS — Z6841 Body Mass Index (BMI) 40.0 and over, adult: Secondary | ICD-10-CM | POA: Diagnosis not present

## 2017-05-26 DIAGNOSIS — B957 Other staphylococcus as the cause of diseases classified elsewhere: Secondary | ICD-10-CM

## 2017-05-26 DIAGNOSIS — I120 Hypertensive chronic kidney disease with stage 5 chronic kidney disease or end stage renal disease: Secondary | ICD-10-CM | POA: Diagnosis not present

## 2017-05-26 DIAGNOSIS — N179 Acute kidney failure, unspecified: Secondary | ICD-10-CM

## 2017-05-26 DIAGNOSIS — D631 Anemia in chronic kidney disease: Secondary | ICD-10-CM | POA: Diagnosis not present

## 2017-05-26 DIAGNOSIS — T82868A Thrombosis of vascular prosthetic devices, implants and grafts, initial encounter: Secondary | ICD-10-CM | POA: Diagnosis not present

## 2017-05-26 DIAGNOSIS — N2581 Secondary hyperparathyroidism of renal origin: Secondary | ICD-10-CM | POA: Diagnosis not present

## 2017-05-26 DIAGNOSIS — I5030 Unspecified diastolic (congestive) heart failure: Secondary | ICD-10-CM | POA: Diagnosis not present

## 2017-05-26 DIAGNOSIS — T8241XA Breakdown (mechanical) of vascular dialysis catheter, initial encounter: Secondary | ICD-10-CM | POA: Diagnosis not present

## 2017-05-26 LAB — GLUCOSE, CAPILLARY
GLUCOSE-CAPILLARY: 158 mg/dL — AB (ref 65–99)
GLUCOSE-CAPILLARY: 210 mg/dL — AB (ref 65–99)
Glucose-Capillary: 146 mg/dL — ABNORMAL HIGH (ref 65–99)

## 2017-05-26 LAB — CBC
HCT: 24.3 % — ABNORMAL LOW (ref 39.0–52.0)
Hemoglobin: 7.3 g/dL — ABNORMAL LOW (ref 13.0–17.0)
MCH: 28.7 pg (ref 26.0–34.0)
MCHC: 30 g/dL (ref 30.0–36.0)
MCV: 95.7 fL (ref 78.0–100.0)
PLATELETS: 318 10*3/uL (ref 150–400)
RBC: 2.54 MIL/uL — AB (ref 4.22–5.81)
RDW: 19.1 % — AB (ref 11.5–15.5)
WBC: 10 10*3/uL (ref 4.0–10.5)

## 2017-05-26 LAB — MRSA PCR SCREENING: MRSA BY PCR: NEGATIVE

## 2017-05-26 LAB — BLOOD CULTURE ID PANEL (REFLEXED)

## 2017-05-26 LAB — BASIC METABOLIC PANEL
ANION GAP: 14 (ref 5–15)
BUN: 75 mg/dL — ABNORMAL HIGH (ref 6–20)
CALCIUM: 8.8 mg/dL — AB (ref 8.9–10.3)
CO2: 23 mmol/L (ref 22–32)
Chloride: 97 mmol/L — ABNORMAL LOW (ref 101–111)
Creatinine, Ser: 13.18 mg/dL — ABNORMAL HIGH (ref 0.61–1.24)
GFR, EST AFRICAN AMERICAN: 4 mL/min — AB (ref >60)
GFR, EST NON AFRICAN AMERICAN: 4 mL/min — AB (ref >60)
Glucose, Bld: 154 mg/dL — ABNORMAL HIGH (ref 65–99)
POTASSIUM: 4.3 mmol/L (ref 3.5–5.1)
SODIUM: 134 mmol/L — AB (ref 135–145)

## 2017-05-26 LAB — PROCALCITONIN: Procalcitonin: 11.99 ng/mL

## 2017-05-26 LAB — IRON AND TIBC
Iron: 15 ug/dL — ABNORMAL LOW (ref 45–182)
SATURATION RATIOS: 5 % — AB (ref 17.9–39.5)
TIBC: 277 ug/dL (ref 250–450)
UIBC: 262 ug/dL

## 2017-05-26 LAB — FERRITIN: FERRITIN: 897 ng/mL — AB (ref 24–336)

## 2017-05-26 LAB — PROTIME-INR
INR: 1.47
Prothrombin Time: 18 seconds — ABNORMAL HIGH (ref 11.4–15.2)

## 2017-05-26 LAB — APTT: aPTT: 42 seconds — ABNORMAL HIGH (ref 24–36)

## 2017-05-26 LAB — PHOSPHORUS: PHOSPHORUS: 3.6 mg/dL (ref 2.5–4.6)

## 2017-05-26 MED ORDER — WARFARIN SODIUM 7.5 MG PO TABS: 7.5000 mg | ORAL_TABLET | Freq: Once | ORAL | Status: DC

## 2017-05-26 MED ORDER — AZITHROMYCIN 250 MG PO TABS: ORAL_TABLET | ORAL | 0 refills | Status: DC

## 2017-05-26 MED ORDER — ALLOPURINOL 300 MG PO TABS
600.0000 mg | ORAL_TABLET | Freq: Every day | ORAL | Status: DC
  Administered 2017-05-26: 600 mg via ORAL
  Filled 2017-05-26: qty 2

## 2017-05-26 MED ORDER — RENA-VITE PO TABS: 1.0000 | ORAL_TABLET | Freq: Every day | ORAL | Status: DC

## 2017-05-26 MED ORDER — PANTOPRAZOLE SODIUM 40 MG PO TBEC
40.0000 mg | DELAYED_RELEASE_TABLET | Freq: Every day | ORAL | Status: DC
  Administered 2017-05-26: 40 mg via ORAL
  Filled 2017-05-26: qty 1

## 2017-05-26 MED ORDER — SODIUM CHLORIDE 0.9 % IV SOLN
2500.0000 mg | Freq: Once | INTRAVENOUS | Status: DC
  Filled 2017-05-26: qty 2500

## 2017-05-26 MED ORDER — VANCOMYCIN HCL 10 G IV SOLR
2500.0000 mg | Freq: Once | INTRAVENOUS | Status: AC
  Administered 2017-05-26: 2500 mg via INTRAVENOUS
  Filled 2017-05-26: qty 2500

## 2017-05-26 MED ORDER — COLCHICINE 0.6 MG PO TABS
1.2000 mg | ORAL_TABLET | Freq: Every day | ORAL | Status: DC
  Administered 2017-05-26: 1.2 mg via ORAL
  Filled 2017-05-26: qty 2

## 2017-05-26 MED ORDER — PIPERACILLIN-TAZOBACTAM 3.375 G IVPB
3.3750 g | Freq: Two times a day (BID) | INTRAVENOUS | Status: DC
  Administered 2017-05-26: 3.375 g via INTRAVENOUS
  Filled 2017-05-26 (×2): qty 50

## 2017-05-26 NOTE — Progress Notes (Signed)
ANTICOAGULATION CONSULT NOTE  Pharmacy Consult for Warfarin Indication: DVT  Allergies  Allergen Reactions  . Aspirin Other (See Comments)    GI upset     Patient Measurements: Weight: (!) 394 lb 10 oz (179 kg) (weighed in bed )  Vital Signs: Temp: 99.7 F (37.6 C) (08/22 0455) Temp Source: Oral (08/22 0455) BP: 141/85 (08/22 0455) Pulse Rate: 129 (08/22 0455)  Labs:  Recent Labs  05/25/17 1348 05/25/17 1857 05/26/17 0522  HGB 7.6*  --  7.3*  HCT 24.7*  --  24.3*  PLT 341  --  318  APTT  --   --  42*  LABPROT  --  18.0* 18.0*  INR  --  1.48 1.47  CREATININE 15.29*  --  13.18*    Estimated Creatinine Clearance: 11.1 mL/min (A) (by C-G formula based on SCr of 13.18 mg/dL (H)).   Assessment: 51 YOM here with weakness and suspected sepsis.  Hx of DVT on warfarin at home- patient states his dose if 5mg  daily with last dose on 8/20.  INR on admission low at 1.48. Warfarin dose was ordered last evening, but not given.  INR this morning 1.47. Hgb 7.3, plts 318- no bleeding noted.  Goal of Therapy:  INR 2-3 Monitor platelets by anticoagulation protocol: Yes   Plan:  -Warfarin 7.5mg  po x1 tonight  -Daily PT/INR  -Monitor for s/s of bleeding.   Neha Waight D. Kaisei Gilbo, PharmD, BCPS Clinical Pharmacist Pager: [phone] Clinical Phone for 05/26/2017 until 3:30pm: [phone] If after 3:30pm, please call main pharmacy at [phone] 05/26/2017 7:52 AM

## 2017-05-26 NOTE — Discharge Summary (Signed)
Physician Discharge Summary  OTNIEL PHENG HQR:[id number] DOB: 05/26/1966 DOA: 05/25/2017  PCP: Camille Bal, MD  Admit date: 05/25/2017 Discharge date: 05/26/2017  Admitted From: Home Discharge disposition: Home   Recommendations for Outpatient Follow-Up:   1. Patient will need IV vancomycin at HD for 2 weeks per Dr. Blair Dolphin recommendations for 2 blood cultures positive for coagulase-negative staph. 2. Follow-up surveillance blood cultures drawn 05/26/17.   Discharge Diagnosis:   Principal Problem:   End-stage renal disease on hemodialysis (HCC) Active Problems:   Obstructive sleep apnea   Chronic anticoagulation   Type 2 diabetes mellitus, uncontrolled (HCC)   Morbid obesity (HCC)   Sepsis (HCC)   Anemia due to end stage renal disease (HCC)   SOB (shortness of breath)   Coagulase negative Staphylococcus bacteremia  Discharge Condition: Improved.  Diet recommendation: Low sodium, heart healthy.  Carbohydrate-modified.  Renal.   History of Present Illness:   Shane Hamilton is an 51 y.o. male with a PMH of uncontrolled diabetes, ESRD on HD, morbid obesity, OSA on CPAP, hypertension, and DVT on chronic anticoagulation who was admitted 05/25/17 for evaluation of generalized weakness. Of note, unable to have dialysis on the date of admission due to catheter issue. In the ED, the patient was given 1 unit of PRBCs and was noted to be febrile. Nephrology consulted to dialyze.   Hospital Course by Problem:   Principal Problem:   End-stage renal disease on hemodialysis [facility]) Presenting with generalized weakness and dyspnea Nephrology consulted for dialysis given that he missed his session and appeared to be volume overloaded on initial presentation. EDW is 181 kg. Chest x-ray negative for infiltrates. Underwent successful HD overnight.    Possible sepsis/Coagulase-negative staph bacteremia Mild fever and tachycardia noted on admission. No obvious source with a  negative chest x-ray. Was not given IV fluid boluses due to his ESRD and volume overload. Blood and urine cultures sent. Placed on empiric IV vancomycin and Zosyn. 2 of 2 blood cultures ultimately grew coagulase-negative staph bacteremia. Case discussed with Dr. Orvan Falconer of ID who recommended 2 weeks of vancomycin given at HD. Surveillance cultures drawn prior to discharge.  Active Problems:   Obstructive sleep apnea Continue CPAP at bedtime.    Chronic anticoagulation Continue Coumadin per pharmacy. Subtherapeutic on admission.    Type 2 diabetes mellitus, uncontrolled (HCC) Hemoglobin A1c 5.9%. Currently being managed with Levemir 70 units daily and insulin resistant SSI Q AC/HS. CBG range 146-158.    Anemia due to end stage renal disease (HCC) Given 1 unit of PRBCs in the ED yesterday. Hemoglobin 7.3 mg/dL.    HIV screening Screened 11/01/2009: Nonreactive.    Morbid obesity Body mass index is 55.04 kg/m.   Medical Consultants:    Nephrology   Discharge Exam:   Vitals:   05/26/17 0455 05/26/17 1000  BP: (!) 141/85 134/75  Pulse: (!) 129 (!) 129  Resp: 20 20  Temp: 99.7 F (37.6 C) 100.2 F (37.9 C)  SpO2: 98% 99%   Vitals:   05/26/17 0335 05/26/17 0355 05/26/17 0455 05/26/17 1000  BP: (!) 175/95  (!) 141/85 134/75  Pulse: (!) 124  (!) 129 (!) 129  Resp: 20  20 20   Temp: 100 F (37.8 C)  99.7 F (37.6 C) 100.2 F (37.9 C)  TempSrc: Oral  Oral Oral  SpO2:  97% 98% 99%  Weight:  (!) 179 kg (394 lb 10 oz)      General exam: Morbidly obese. Appears calm and comfortable.  Respiratory system: Clear to auscultation. Respiratory effort normal. Cardiovascular system: S1 & S2 heard, RRR. No JVD,  rubs, gallops or clicks. No murmurs. Gastrointestinal system: Abdomen is nondistended, soft and nontender. No organomegaly or masses felt. Normal bowel sounds heard. Central nervous system: Alert and oriented. No focal neurological deficits. Extremities: No clubbing,   or cyanosis. 2+ edema. Skin: No rashes, lesions or ulcers. Psychiatry: Judgement and insight appear normal. Mood & affect appropriate.    The results of significant diagnostics from this hospitalization (including imaging, microbiology, ancillary and laboratory) are listed below for reference.     Procedures and Diagnostic Studies:   Dg Chest Portable 1 View  Result Date: 05/25/2017 CLINICAL DATA:  Dyspnea after missing dialysis EXAM: PORTABLE CHEST 1 VIEW COMPARISON:  05/15/2016 FINDINGS: Lung volumes are low with mild prominence of the cardiac silhouette in part due to the portable technique and low lung volumes. Mild interstitial edema is suggested characterized by diffuse hazy increase in interstitial lung markings but without pneumonic consolidation, effusion or pneumothorax. No acute nor suspicious osseous abnormalities. IMPRESSION: Borderline cardiomegaly. Mild interstitial edema is suggested with low lung volumes. Electronically Signed   By: Tollie Eth M.D.   On: 05/25/2017 18:12     Labs:   Basic Metabolic Panel:  Recent Labs Lab 05/25/17 1348 05/26/17 0522  NA 135 134*  K 4.3 4.3  CL 97* 97*  CO2 23 23  GLUCOSE 156* 154*  BUN 92* 75*  CREATININE 15.29* 13.18*  CALCIUM 8.6* 8.8*  PHOS  --  3.6   GFR Estimated Creatinine Clearance: 11.1 mL/min (A) (by C-G formula based on SCr of 13.18 mg/dL (H)). Liver Function Tests:  Recent Labs Lab 05/25/17 1348  AST 26  ALT 24  ALKPHOS 131*  BILITOT 0.6  PROT 8.2*  ALBUMIN 3.0*   No results for input(s): LIPASE, AMYLASE in the last 168 hours. No results for input(s): AMMONIA in the last 168 hours. Coagulation profile  Recent Labs Lab 05/25/17 1857 05/26/17 0522  INR 1.48 1.47    CBC:  Recent Labs Lab 05/25/17 1348 05/26/17 0522  WBC 8.9 10.0  HGB 7.6* 7.3*  HCT 24.7* 24.3*  MCV 95.0 95.7  PLT 341 318   Cardiac Enzymes: No results for input(s): CKTOTAL, CKMB, CKMBINDEX, TROPONINI in the last 168  hours. BNP: Invalid input(s): POCBNP CBG:  Recent Labs Lab 05/26/17 0436 05/26/17 0800 05/26/17 1134  GLUCAP 146* 158* 210*   D-Dimer No results for input(s): DDIMER in the last 72 hours. Hgb A1c  Recent Labs  05/25/17 2212  HGBA1C 5.9*   Lipid Profile No results for input(s): CHOL, HDL, LDLCALC, TRIG, CHOLHDL, LDLDIRECT in the last 72 hours. Thyroid function studies No results for input(s): TSH, T4TOTAL, T3FREE, THYROIDAB in the last 72 hours.  Invalid input(s): FREET3 Anemia work up  [employer]  05/26/17 0522  FERRITIN 897*  TIBC 277  IRON 15*   Microbiology Recent Results (from the past 240 hour(s))  Blood culture (routine x 2)     Status: None (Preliminary result)   Collection Time: 05/25/17  6:52 PM  Result Value Ref Range Status   Specimen Description BLOOD LEFT HAND  Final   Special Requests IN PEDIATRIC BOTTLE Blood Culture adequate volume  Final   Culture  Setup Time   Final    GRAM POSITIVE COCCI IN CLUSTERS IN PEDIATRIC BOTTLE Organism ID to follow CRITICAL RESULT CALLED TO, READ BACK BY AND VERIFIED WITH: MLoretha Brasil.D. 15:05 05/26/17 ([email or web address])  Culture GRAM POSITIVE COCCI  Final   Report Status PENDING  Incomplete  Blood Culture ID Panel (Reflexed)     Status: Abnormal   Collection Time: 05/25/17  6:52 PM  Result Value Ref Range Status   Enterococcus species NOT DETECTED NOT DETECTED Final   Listeria monocytogenes NOT DETECTED NOT DETECTED Final   Staphylococcus species DETECTED (A) NOT DETECTED Final    Comment: Methicillin (oxacillin) resistant coagulase negative staphylococcus. Possible blood culture contaminant (unless isolated from more than one blood culture draw or clinical case suggests pathogenicity). No antibiotic treatment is indicated for blood  culture contaminants. CRITICAL RESULT CALLED TO, READ BACK BY AND VERIFIED WITH: M. Mayford Knife Pharm.D. 15:05 05/26/17 ([email or web address])    Staphylococcus aureus NOT DETECTED NOT DETECTED Final     Methicillin resistance DETECTED (A) NOT DETECTED Final    Comment: CRITICAL RESULT CALLED TO, READ BACK BY AND VERIFIED WITH: M. Mayford Knife Pharm.D. 15:05 05/26/17 ([email or web address])    Streptococcus species NOT DETECTED NOT DETECTED Final   Streptococcus agalactiae NOT DETECTED NOT DETECTED Final   Streptococcus pneumoniae NOT DETECTED NOT DETECTED Final   Streptococcus pyogenes NOT DETECTED NOT DETECTED Final   Acinetobacter baumannii NOT DETECTED NOT DETECTED Final   Enterobacteriaceae species NOT DETECTED NOT DETECTED Final   Enterobacter cloacae complex NOT DETECTED NOT DETECTED Final   Escherichia coli NOT DETECTED NOT DETECTED Final   Klebsiella oxytoca NOT DETECTED NOT DETECTED Final   Klebsiella pneumoniae NOT DETECTED NOT DETECTED Final   Proteus species NOT DETECTED NOT DETECTED Final   Serratia marcescens NOT DETECTED NOT DETECTED Final   Haemophilus influenzae NOT DETECTED NOT DETECTED Final   Neisseria meningitidis NOT DETECTED NOT DETECTED Final   Pseudomonas aeruginosa NOT DETECTED NOT DETECTED Final   Candida albicans NOT DETECTED NOT DETECTED Final   Candida glabrata NOT DETECTED NOT DETECTED Final   Candida krusei NOT DETECTED NOT DETECTED Final   Candida parapsilosis NOT DETECTED NOT DETECTED Final   Candida tropicalis NOT DETECTED NOT DETECTED Final  Blood culture (routine x 2)     Status: None (Preliminary result)   Collection Time: 05/25/17  6:57 PM  Result Value Ref Range Status   Specimen Description BLOOD LEFT ANTECUBITAL  Final   Special Requests   Final    BOTTLES DRAWN AEROBIC AND ANAEROBIC Blood Culture adequate volume   Culture  Setup Time   Final    GRAM POSITIVE COCCI IN BOTH AEROBIC AND ANAEROBIC BOTTLES IDENTIFICATION TO FOLLOW CRITICAL VALUE NOTED.  VALUE IS CONSISTENT WITH PREVIOUSLY REPORTED AND CALLED VALUE.    Culture GRAM POSITIVE COCCI  Final   Report Status PENDING  Incomplete  MRSA PCR Screening     Status: None   Collection Time: 05/26/17  6:45  AM  Result Value Ref Range Status   MRSA by PCR NEGATIVE NEGATIVE Final    Comment:        The GeneXpert MRSA Assay (FDA approved for NASAL specimens only), is one component of a comprehensive MRSA colonization surveillance program. It is not intended to diagnose MRSA infection nor to guide or monitor treatment for MRSA infections.      Discharge Instructions:   Discharge Instructions    Call MD for:  extreme fatigue    Complete by:  As directed    Call MD for:  persistant nausea and vomiting    Complete by:  As directed    Call MD for:  temperature >100.4    Complete by:  As directed  Diet - low sodium heart healthy    Complete by:  As directed    Increase activity slowly    Complete by:  As directed      Allergies as of 05/26/2017      Reactions   Aspirin Other (See Comments)   GI upset       Medication List    TAKE these medications   allopurinol 300 MG tablet Commonly known as:  ZYLOPRIM Take 600 mg by mouth daily.   cinacalcet 60 MG tablet Commonly known as:  SENSIPAR Take 60 mg by mouth daily.   clonazePAM 1 MG tablet Commonly known as:  KLONOPIN Take 1 tablet (1 mg total) by mouth 2 (two) times daily as needed for anxiety. What changed:  how much to take  when to take this   colchicine 0.6 MG tablet Take 1.2 mg by mouth daily.   gabapentin 300 MG capsule Commonly known as:  NEURONTIN Take 1 capsule (300 mg total) by mouth 3 (three) times daily as needed (pain).   HYDROcodone-acetaminophen 5-325 MG tablet Commonly known as:  NORCO/VICODIN Take 1-2 tablets by mouth every 12 (twelve) hours as needed for severe pain.   lanthanum 1000 MG chewable tablet Commonly known as:  FOSRENOL Chew 1,000 mg by mouth 3 (three) times daily with meals.   LEVEMIR FLEXTOUCH 100 UNIT/ML Pen Generic drug:  Insulin Detemir INJECT 70 UNITS UNDER THE SKIN QHS   lidocaine 5 % ointment Commonly known as:  XYLOCAINE Apply to feet twice daily as needed.  Use  with gloves/cotton swab   multivitamin Tabs tablet Take 1 tablet by mouth daily.   nortriptyline 10 MG capsule Commonly known as:  PAMELOR Take 1 capsule (10 mg total) by mouth at bedtime.   NOVOLOG FLEXPEN 100 UNIT/ML FlexPen Generic drug:  insulin aspart Inject 35 Units as directed 3 (three) times daily with meals.   omeprazole 40 MG capsule Commonly known as:  PRILOSEC Take 40 mg by mouth daily   oxyCODONE-acetaminophen 5-325 MG tablet Commonly known as:  ROXICET Take 1-2 tablets by mouth every 4 (four) hours as needed. What changed:  reasons to take this   PHOSLO 667 MG capsule Generic drug:  calcium acetate Take 2,001 mg by mouth 3 (three) times daily with meals. Also with snacks   traMADol 50 MG tablet Commonly known as:  ULTRAM Take 1 tablet (50 mg total) by mouth every 6 (six) hours as needed for moderate pain.   VISINE OP Place 1 drop into both eyes daily as needed (dry eyes).   warfarin 5 MG tablet Commonly known as:  COUMADIN Take 5 mg by mouth daily            Discharge Care Instructions        Start     Ordered   05/26/17 0000  Increase activity slowly     05/26/17 1233   05/26/17 0000  Diet - low sodium heart healthy     05/26/17 1233   05/26/17 0000  Call MD for:  temperature >100.4     05/26/17 1233   05/26/17 0000  Call MD for:  persistant nausea and vomiting     05/26/17 1233   05/26/17 0000  Call MD for:  extreme fatigue     05/26/17 1233     Follow-up Information    Camille Bal, MD Follow up on 05/27/2017.   Specialty:  Nephrology Why:  For your usual HD sessions.  Contact information: [address]  [place] [address] [phone]            Time coordinating discharge: 35 minutes.  Signed:  RAMA,CHRISTINA  Pager [phone] [facility] 05/26/2017, 7:15 PM

## 2017-05-26 NOTE — Progress Notes (Signed)
Shane Hamilton to be D/C'd Home per MD order.  Discussed prescriptions and follow up appointments with the patient. Prescriptions given to patient, medication list explained in detail. Pt verbalized understanding.  Allergies as of 05/26/2017      Reactions   Aspirin Other (See Comments)   GI upset       Medication List    TAKE these medications   allopurinol 300 MG tablet Commonly known as:  ZYLOPRIM Take 600 mg by mouth daily.   azithromycin 250 MG tablet Commonly known as:  ZITHROMAX Z-PAK Take 2 tablets (500 mg) on  Day 1,  followed by 1 tablet (250 mg) once daily on Days 2 through 5.   cinacalcet 60 MG tablet Commonly known as:  SENSIPAR Take 60 mg by mouth daily.   clonazePAM 1 MG tablet Commonly known as:  KLONOPIN Take 1 tablet (1 mg total) by mouth 2 (two) times daily as needed for anxiety. What changed:  how much to take  when to take this   colchicine 0.6 MG tablet Take 1.2 mg by mouth daily.   gabapentin 300 MG capsule Commonly known as:  NEURONTIN Take 1 capsule (300 mg total) by mouth 3 (three) times daily as needed (pain).   HYDROcodone-acetaminophen 5-325 MG tablet Commonly known as:  NORCO/VICODIN Take 1-2 tablets by mouth every 12 (twelve) hours as needed for severe pain.   lanthanum 1000 MG chewable tablet Commonly known as:  FOSRENOL Chew 1,000 mg by mouth 3 (three) times daily with meals.   LEVEMIR FLEXTOUCH 100 UNIT/ML Pen Generic drug:  Insulin Detemir INJECT 70 UNITS UNDER THE SKIN QHS   lidocaine 5 % ointment Commonly known as:  XYLOCAINE Apply to feet twice daily as needed.  Use with gloves/cotton swab   multivitamin Tabs tablet Take 1 tablet by mouth daily.   nortriptyline 10 MG capsule Commonly known as:  PAMELOR Take 1 capsule (10 mg total) by mouth at bedtime.   NOVOLOG FLEXPEN 100 UNIT/ML FlexPen Generic drug:  insulin aspart Inject 35 Units as directed 3 (three) times daily with meals.   omeprazole 40 MG  capsule Commonly known as:  PRILOSEC Take 40 mg by mouth daily   oxyCODONE-acetaminophen 5-325 MG tablet Commonly known as:  ROXICET Take 1-2 tablets by mouth every 4 (four) hours as needed. What changed:  reasons to take this   PHOSLO 667 MG capsule Generic drug:  calcium acetate Take 2,001 mg by mouth 3 (three) times daily with meals. Also with snacks   traMADol 50 MG tablet Commonly known as:  ULTRAM Take 1 tablet (50 mg total) by mouth every 6 (six) hours as needed for moderate pain.   VISINE OP Place 1 drop into both eyes daily as needed (dry eyes).   warfarin 5 MG tablet Commonly known as:  COUMADIN Take 5 mg by mouth daily            Discharge Care Instructions        Start     Ordered   05/26/17 0000  azithromycin (ZITHROMAX Z-PAK) 250 MG tablet     05/26/17 1233   05/26/17 0000  Increase activity slowly     05/26/17 1233   05/26/17 0000  Diet - low sodium heart healthy     05/26/17 1233   05/26/17 0000  Call MD for:  temperature >100.4     05/26/17 1233   05/26/17 0000  Call MD for:  persistant nausea and vomiting     05/26/17  1233   05/26/17 0000  Call MD for:  extreme fatigue     05/26/17 1233      Vitals:   05/26/17 0455 05/26/17 1000  BP: (!) 141/85 134/75  Pulse: (!) 129 (!) 129  Resp: 20 20  Temp: 99.7 F (37.6 C) 100.2 F (37.9 C)  SpO2: 98% 99%    Skin clean, dry and intact without evidence of skin break down, no evidence of skin tears noted. IV catheter discontinued intact. Site without signs and symptoms of complications. Dressing and pressure applied. Pt denies pain at this time. No complaints noted.  An After Visit Summary was printed and given to the patient. Patient escorted via WC, and D/C home via private auto.  Britt Bolognese RN, BSN

## 2017-05-26 NOTE — Progress Notes (Signed)
Pharmacy Antibiotic Note  Shane Hamilton is a 51 y.o. male admitted on 05/25/2017 with sepsis.  Pharmacy has been consulted for Vancomycin/Zosyn dosing. WBC WNL. CXR ok. Pt is having trouble with his HD access and his schedule and duration of HD has been inconsistent.   Plan: Vancomycin 2500 mg IV x 1, then f/u HD schedule for additional doses (schedule has been inconsistent) Zosyn 3.375G IV q12h to be infused over 4 hours Trend WBC, temp, HD schedule   F/U infectious work-up Drug levels as indicated   Weight: (!) 404 lb 15.8 oz (183.7 kg)  Temp (24hrs), Avg:99.3 F (37.4 C), Min:97.6 F (36.4 C), Max:100.7 F (38.2 C)   Recent Labs Lab 05/25/17 1348 05/25/17 1851 05/25/17 2212  WBC 8.9  --   --   CREATININE 15.29*  --   --   LATICACIDVEN  --  0.8 1.2    Estimated Creatinine Clearance: 9.7 mL/min (A) (by C-G formula based on SCr of 15.29 mg/dL (H)).    Allergies  Allergen Reactions  . Aspirin Other (See Comments)    GI upset     Abran Duke 05/26/2017 2:45 AM

## 2017-05-26 NOTE — Discharge Instructions (Signed)
Dialysis Diet  Dialysis is a treatment that cleans your blood. It is used when the kidneys are damaged. When you need dialysis, you should watch your diet. This is because some nutrients can build up in your blood between treatments and make you sick. These nutrients are:   Potassium.   Phosphorus.   Sodium.    Your doctor or dietitian will:   Tell you how much of these you can have.   Tell you if you need to look out for other nutrients too.   Help you plan meals.   Tell you how much to drink each day.    What do I need to know about this diet?   Limit potassium. Potassium is in milk, fruits, and vegetables.   Limit phosphorus. Phosphorus is in milk, cheese, beans, nuts, and carbonated beverages.   Limit salt (sodium). Foods that have a lot sodium include processed and cured meats, ready-made frozen meals, canned vegetables, and salty snack foods.   Do not use salt substitutes.   Try not to eat whole-grain foods and foods that have a lot of fiber.   Follow your doctor's instructions about how much to drink. You may be told to:  ? Write down what you drink.  ? Write down foods you eat that are made mostly from water, such as gelatin and soups.  ? Drink from small cups.   Ask your doctor if you should take a medicine that binds phosphorus.   Take vitamin and mineral supplements only as told by your doctor.   Eat meat, poultry, fish, and eggs. Limit nuts and beans.   Before you cook potatoes, cut them into small pieces. Then boil them in unsalted water.   Drain all fluid from cooked vegetables and canned fruits before you eat them.  What foods can I eat?  Grains  White bread. White rice. Cooked cereal. Unsalted popcorn. Tortillas. Pasta.  Vegetables  Fresh or frozen broccoli, carrots, and green beans. Cabbage. Cauliflower. Celery. Cucumbers. Eggplant. Radishes. Zucchini.  Fruits  Apples. Fresh or frozen berries. Fresh or canned pears, peaches, and pineapple. Grapes. Plums.  Meats and Other Protein  Sources  Fresh or frozen beef, pork, chicken, and fish. Eggs. Low-sodium canned tuna or salmon.  Dairy  Cream cheese. Heavy cream. Ricotta cheese.  Beverages  Apple cider. Cranberry juice. Grape juice. Lemonade. Black coffee.  Condiments  Herbs. Spices. Jam and jelly. Honey.  Sweets and Desserts  Sherbet. Cakes. Cookies.  Fats and Oils  Olive oil, canola oil, and safflower oil.  Other  Non-dairy creamer. Non-dairy whipped topping. Homemade broth without salt.  The items listed above may not be a complete list of recommended foods or beverages. Contact your dietitian for more options.  What foods are not recommended?  Grains  Whole-grain bread. Whole-grain pasta. High-fiber cereal.  Vegetables  Potatoes. Beets. Tomatoes. Winter squash and pumpkin. Asparagus. Spinach. Parsnips.  Fruits  Star fruit. Bananas. Oranges. Kiwi. Nectarines. Prunes. Melon. Dried fruit. Avocado.  Meats and Other Protein Sources  Canned, smoked, and cured meats. Packaged luncheon meat. Sardines. Nuts and seeds. Peanut butter. Beans and legumes.  Dairy  Milk. Buttermilk. Yogurt. Cheese and cottage cheese. Processed cheese spreads.  Beverages  Orange juice. Prune juice. Carbonated soft drinks.  Condiments  Salt. Salt substitutes. Soy sauce.  Sweets and Desserts  Ice cream. Chocolate. Candied nuts.  Fats and Oils  Butter. Margarine.  Other  Ready-made frozen meals. Canned soups.  The items listed above may not be a   complete list of foods and beverages to avoid. Contact your dietitian for more information.  This information is not intended to replace advice given to you by your health care provider. Make sure you discuss any questions you have with your health care provider.  Document Released: 03/22/2012 Document Revised: 02/27/2016 Document Reviewed: 04/24/2014  Elsevier Interactive Patient Education  2018 [employer]

## 2017-05-26 NOTE — Progress Notes (Signed)
MD stated that she would like patients labs to be drawn before D/C. Will continue to monitor.

## 2017-05-26 NOTE — Progress Notes (Signed)
[facility] Progress Note  Subjective: no c/o's, wants to go home   Vitals:   05/26/17 0335 05/26/17 0355 05/26/17 0455 05/26/17 1000  BP: (!) 175/95  (!) 141/85 134/75  Pulse: (!) 124  (!) 129 (!) 129  Resp: 20  20 20   Temp: 100 F (37.8 C)  99.7 F (37.6 C) 100.2 F (37.9 C)  TempSrc: Oral  Oral Oral  SpO2:  97% 98% 99%  Weight:  (!) 179 kg (394 lb 10 oz)      Inpatient medications: . allopurinol  600 mg Oral Daily  . calcium acetate  2,001 mg Oral TID WC  . [START ON 05/29/2017] cinacalcet  60 mg Oral Once per day on Tue Sat  . clonazePAM  2 mg Oral QPM  . colchicine  1.2 mg Oral Daily  . docusate sodium  100 mg Oral BID  . insulin aspart  0-20 Units Subcutaneous TID WC  . insulin aspart  0-5 Units Subcutaneous QHS  . insulin aspart  35 Units Subcutaneous TID WC  . insulin detemir  70 Units Subcutaneous Q2200  . lanthanum  1,000 mg Oral TID WC  . multivitamin  1 tablet Oral QHS  . nortriptyline  10 mg Oral QHS  . pantoprazole  40 mg Oral Daily  . sodium chloride flush  3 mL Intravenous Q12H  . warfarin  7.5 mg Oral ONCE-1800  . Warfarin - Pharmacist Dosing Inpatient   Does not apply q1800   . piperacillin-tazobactam (ZOSYN)  IV Stopped (05/26/17 0920)   acetaminophen **OR** acetaminophen, calcium carbonate (dosed in mg elemental calcium), camphor-menthol **AND** hydrOXYzine, docusate sodium, feeding supplement (NEPRO CARB STEADY), gabapentin, HYDROcodone-acetaminophen, ondansetron **OR** ondansetron (ZOFRAN) IV, oxyCODONE-acetaminophen, sorbitol, tetrahydrozoline, traMADol, zolpidem  Exam: General appearance: alert, cooperative and appears stated age Resp: no rales or wheezing Chest wall: no tenderness Cardio: regular rate and rhythm, S1, S2 normal, no murmur, click, rub or gallop GI: soft, non-tender; bowel sounds normal; no masses,  no organomegaly and morbidly obese Extremities: edema 2+ Pulses: 2+ and symmetric Skin: Skin color, texture, turgor  normal. No rashes or lesions Groin: tunneled HD cath w/o drainage Neurologic: Grossly normal  Dialysis: TTS   6h   400/800   181kg  2k/2.5 bath        Impression: 1.  SOB - dialyzed last night UF 4L,  and now is under dry wt, no SOB and lungs clear. CXR w/o edema in ED yest.   2.  ESRD - HD TTS 3.  HD access - chronic problems / near end-stage access. HD cath worked last night, no need for replacement at this time.  It is somewhat positional.   4.  Fevers - < 101, +cough, blood cx's neg.  No evidence cath infection.  Defer to primary re: this.  5.  Dispo - ok for dc from renal standpoint.   Plan - as above, have d/w PMD   Vinson Moselle MD [facility] pager [phone]   05/26/2017, 2:34 PM    Recent Labs Lab 05/25/17 1348 05/26/17 0522  NA 135 134*  K 4.3 4.3  CL 97* 97*  CO2 23 23  GLUCOSE 156* 154*  BUN 92* 75*  CREATININE 15.29* 13.18*  CALCIUM 8.6* 8.8*  PHOS  --  3.6    Recent Labs Lab 05/25/17 1348  AST 26  ALT 24  ALKPHOS 131*  BILITOT 0.6  PROT 8.2*  ALBUMIN 3.0*    Recent Labs Lab 05/25/17 1348 05/26/17 0522  WBC 8.9 10.0  HGB 7.6* 7.3*  HCT 24.7* 24.3*  MCV 95.0 95.7  PLT 341 318   Iron/TIBC/Ferritin/ %Sat    Component Value Date/Time   IRON 15 (L) 05/26/2017 0522   TIBC 277 05/26/2017 0522   FERRITIN 897 (H) 05/26/2017 0522   IRONPCTSAT 5 (L) 05/26/2017 0522

## 2017-05-26 NOTE — Progress Notes (Signed)
Blood cx's which were negative this am are now +, looks like 2/2 for coag neg staph.  Primary MD spoke w ID and they recommended 2 wks IV abx with HD.  Have d/w patient the options.  Given advanced access issues and low pathogenic bacteremia agree with empiric Rx to see if we can treat through the infection.  Patient wants to go home and agrees w / plan as well.  Will give vanc IV 2 wks with HD in outpt setting.   Vinson Moselle MD [employer] pgr [phone]   05/26/2017, 4:13 PM

## 2017-05-26 NOTE — Progress Notes (Signed)
Pt returned from HD overnight. Pt has  levemir 70 units hs that he did not receive due to being in HD and is currently NPO. Page to K. Schorr to see if dosing adjustment would be appropriate at this time. Dondra Spry, RN

## 2017-05-26 NOTE — Progress Notes (Addendum)
PHARMACY - PHYSICIAN COMMUNICATION CRITICAL VALUE ALERT - BLOOD CULTURE IDENTIFICATION (BCID)  Results for orders placed or performed during the hospital encounter of 05/25/17  Blood Culture ID Panel (Reflexed) (Collected: 05/25/2017  6:52 PM)  Result Value Ref Range   Enterococcus species NOT DETECTED NOT DETECTED   Listeria monocytogenes NOT DETECTED NOT DETECTED   Staphylococcus species DETECTED (A) NOT DETECTED   Staphylococcus aureus NOT DETECTED NOT DETECTED   Methicillin resistance DETECTED (A) NOT DETECTED   Streptococcus species NOT DETECTED NOT DETECTED   Streptococcus agalactiae NOT DETECTED NOT DETECTED   Streptococcus pneumoniae NOT DETECTED NOT DETECTED   Streptococcus pyogenes NOT DETECTED NOT DETECTED   Acinetobacter baumannii NOT DETECTED NOT DETECTED   Enterobacteriaceae species NOT DETECTED NOT DETECTED   Enterobacter cloacae complex NOT DETECTED NOT DETECTED   Escherichia coli NOT DETECTED NOT DETECTED   Klebsiella oxytoca NOT DETECTED NOT DETECTED   Klebsiella pneumoniae NOT DETECTED NOT DETECTED   Proteus species NOT DETECTED NOT DETECTED   Serratia marcescens NOT DETECTED NOT DETECTED   Haemophilus influenzae NOT DETECTED NOT DETECTED   Neisseria meningitidis NOT DETECTED NOT DETECTED   Pseudomonas aeruginosa NOT DETECTED NOT DETECTED   Candida albicans NOT DETECTED NOT DETECTED   Candida glabrata NOT DETECTED NOT DETECTED   Candida krusei NOT DETECTED NOT DETECTED   Candida parapsilosis NOT DETECTED NOT DETECTED   Candida tropicalis NOT DETECTED NOT DETECTED    Name of physician (or Provider) Contacted: Dr. Darnelle Catalan  Changes to prescribed antibiotics required: Discussed with Dr. Darnelle Catalan, she discussed with ID who recommends repeat Blood cultures prior to discharge and Vancomycin x 2 weeks. Will notify Dr. Arlean Hopping to arrange for Vanc with hemodialysis.  Sallee Provencal 05/26/2017  3:27 PM    ADDENDUM  Spoke with Dr. Arlean Hopping about outpatient HD vancomycin  orders- he stated patient will need to be evaluated catheter removal and he would call Dr. Darnelle Catalan to discuss cancelling discharge. Sakeenah Valcarcel D. Alejo Beamer, PharmD, BCPS Clinical Pharmacist 05/26/2017 4:06 PM

## 2017-05-27 DIAGNOSIS — N186 End stage renal disease: Secondary | ICD-10-CM | POA: Diagnosis not present

## 2017-05-27 DIAGNOSIS — Z7901 Long term (current) use of anticoagulants: Secondary | ICD-10-CM | POA: Diagnosis not present

## 2017-05-27 DIAGNOSIS — D631 Anemia in chronic kidney disease: Secondary | ICD-10-CM | POA: Diagnosis not present

## 2017-05-27 DIAGNOSIS — N2581 Secondary hyperparathyroidism of renal origin: Secondary | ICD-10-CM | POA: Diagnosis not present

## 2017-05-27 DIAGNOSIS — T8249XD Other complication of vascular dialysis catheter, subsequent encounter: Secondary | ICD-10-CM | POA: Diagnosis not present

## 2017-05-27 DIAGNOSIS — B958 Unspecified staphylococcus as the cause of diseases classified elsewhere: Secondary | ICD-10-CM | POA: Diagnosis not present

## 2017-05-27 LAB — TYPE AND SCREEN
ABO/RH(D): A POS
Antibody Screen: NEGATIVE
Unit division: 0

## 2017-05-27 LAB — BPAM RBC
Blood Product Expiration Date: 201809192359
ISSUE DATE / TIME: 201808211827
UNIT TYPE AND RH: 6200

## 2017-05-28 LAB — CULTURE, BLOOD (ROUTINE X 2)
SPECIAL REQUESTS: ADEQUATE
Special Requests: ADEQUATE

## 2017-05-29 ENCOUNTER — Inpatient Hospital Stay (HOSPITAL_COMMUNITY)
Admission: EM | Admit: 2017-05-29 | Discharge: 2017-06-01 | DRG: 252 | Disposition: A | Discharge status: Home / Self Care | Payer: Medicare Other | Attending: Internal Medicine | Admitting: Internal Medicine

## 2017-05-29 ENCOUNTER — Encounter (HOSPITAL_COMMUNITY): Payer: Self-pay | Admitting: Internal Medicine

## 2017-05-29 DIAGNOSIS — IMO0002 Reserved for concepts with insufficient information to code with codable children: Secondary | ICD-10-CM | POA: Diagnosis present

## 2017-05-29 DIAGNOSIS — F419 Anxiety disorder, unspecified: Secondary | ICD-10-CM | POA: Diagnosis present

## 2017-05-29 DIAGNOSIS — T82898A Other specified complication of vascular prosthetic devices, implants and grafts, initial encounter: Secondary | ICD-10-CM | POA: Diagnosis not present

## 2017-05-29 DIAGNOSIS — B957 Other staphylococcus as the cause of diseases classified elsewhere: Secondary | ICD-10-CM | POA: Diagnosis present

## 2017-05-29 DIAGNOSIS — N186 End stage renal disease: Secondary | ICD-10-CM | POA: Diagnosis not present

## 2017-05-29 DIAGNOSIS — E1122 Type 2 diabetes mellitus with diabetic chronic kidney disease: Secondary | ICD-10-CM | POA: Diagnosis present

## 2017-05-29 DIAGNOSIS — Z79899 Other long term (current) drug therapy: Secondary | ICD-10-CM

## 2017-05-29 DIAGNOSIS — T8249XD Other complication of vascular dialysis catheter, subsequent encounter: Secondary | ICD-10-CM | POA: Diagnosis not present

## 2017-05-29 DIAGNOSIS — R7881 Bacteremia: Secondary | ICD-10-CM | POA: Diagnosis not present

## 2017-05-29 DIAGNOSIS — Z794 Long term (current) use of insulin: Secondary | ICD-10-CM | POA: Diagnosis not present

## 2017-05-29 DIAGNOSIS — Z833 Family history of diabetes mellitus: Secondary | ICD-10-CM

## 2017-05-29 DIAGNOSIS — M109 Gout, unspecified: Secondary | ICD-10-CM | POA: Diagnosis present

## 2017-05-29 DIAGNOSIS — I132 Hypertensive heart and chronic kidney disease with heart failure and with stage 5 chronic kidney disease, or end stage renal disease: Secondary | ICD-10-CM | POA: Diagnosis present

## 2017-05-29 DIAGNOSIS — M1A30X0 Chronic gout due to renal impairment, unspecified site, without tophus (tophi): Secondary | ICD-10-CM | POA: Diagnosis not present

## 2017-05-29 DIAGNOSIS — I82599 Chronic embolism and thrombosis of other specified deep vein of unspecified lower extremity: Secondary | ICD-10-CM

## 2017-05-29 DIAGNOSIS — F329 Major depressive disorder, single episode, unspecified: Secondary | ICD-10-CM | POA: Diagnosis present

## 2017-05-29 DIAGNOSIS — R569 Unspecified convulsions: Secondary | ICD-10-CM

## 2017-05-29 DIAGNOSIS — Z7984 Long term (current) use of oral hypoglycemic drugs: Secondary | ICD-10-CM | POA: Diagnosis not present

## 2017-05-29 DIAGNOSIS — Z7901 Long term (current) use of anticoagulants: Secondary | ICD-10-CM

## 2017-05-29 DIAGNOSIS — Z86711 Personal history of pulmonary embolism: Secondary | ICD-10-CM

## 2017-05-29 DIAGNOSIS — E1142 Type 2 diabetes mellitus with diabetic polyneuropathy: Secondary | ICD-10-CM | POA: Diagnosis present

## 2017-05-29 DIAGNOSIS — I10 Essential (primary) hypertension: Secondary | ICD-10-CM | POA: Diagnosis not present

## 2017-05-29 DIAGNOSIS — T829XXA Unspecified complication of cardiac and vascular prosthetic device, implant and graft, initial encounter: Secondary | ICD-10-CM | POA: Diagnosis present

## 2017-05-29 DIAGNOSIS — D631 Anemia in chronic kidney disease: Secondary | ICD-10-CM | POA: Diagnosis not present

## 2017-05-29 DIAGNOSIS — T8241XA Breakdown (mechanical) of vascular dialysis catheter, initial encounter: Secondary | ICD-10-CM | POA: Diagnosis not present

## 2017-05-29 DIAGNOSIS — Z9119 Patient's noncompliance with other medical treatment and regimen: Secondary | ICD-10-CM | POA: Diagnosis not present

## 2017-05-29 DIAGNOSIS — G4730 Sleep apnea, unspecified: Secondary | ICD-10-CM | POA: Diagnosis not present

## 2017-05-29 DIAGNOSIS — I5030 Unspecified diastolic (congestive) heart failure: Secondary | ICD-10-CM | POA: Diagnosis present

## 2017-05-29 DIAGNOSIS — N2581 Secondary hyperparathyroidism of renal origin: Secondary | ICD-10-CM | POA: Diagnosis present

## 2017-05-29 DIAGNOSIS — G4733 Obstructive sleep apnea (adult) (pediatric): Secondary | ICD-10-CM | POA: Diagnosis not present

## 2017-05-29 DIAGNOSIS — B958 Unspecified staphylococcus as the cause of diseases classified elsewhere: Secondary | ICD-10-CM | POA: Diagnosis not present

## 2017-05-29 DIAGNOSIS — E0842 Diabetes mellitus due to underlying condition with diabetic polyneuropathy: Secondary | ICD-10-CM | POA: Diagnosis present

## 2017-05-29 DIAGNOSIS — E1165 Type 2 diabetes mellitus with hyperglycemia: Secondary | ICD-10-CM | POA: Diagnosis not present

## 2017-05-29 DIAGNOSIS — Z6841 Body Mass Index (BMI) 40.0 and over, adult: Secondary | ICD-10-CM | POA: Diagnosis not present

## 2017-05-29 DIAGNOSIS — Z886 Allergy status to analgesic agent status: Secondary | ICD-10-CM | POA: Diagnosis not present

## 2017-05-29 DIAGNOSIS — Z86718 Personal history of other venous thrombosis and embolism: Secondary | ICD-10-CM

## 2017-05-29 DIAGNOSIS — I120 Hypertensive chronic kidney disease with stage 5 chronic kidney disease or end stage renal disease: Secondary | ICD-10-CM | POA: Diagnosis not present

## 2017-05-29 DIAGNOSIS — K219 Gastro-esophageal reflux disease without esophagitis: Secondary | ICD-10-CM | POA: Diagnosis present

## 2017-05-29 DIAGNOSIS — Z992 Dependence on renal dialysis: Secondary | ICD-10-CM

## 2017-05-29 DIAGNOSIS — T82868A Thrombosis of vascular prosthetic devices, implants and grafts, initial encounter: Principal | ICD-10-CM | POA: Diagnosis present

## 2017-05-29 DIAGNOSIS — I82409 Acute embolism and thrombosis of unspecified deep veins of unspecified lower extremity: Secondary | ICD-10-CM | POA: Diagnosis present

## 2017-05-29 LAB — CBC WITH DIFFERENTIAL/PLATELET
Basophils Absolute: 0 10*3/uL (ref 0.0–0.1)
Basophils Relative: 0 %
EOS ABS: 0.2 10*3/uL (ref 0.0–0.7)
EOS PCT: 2 %
HCT: 23.1 % — ABNORMAL LOW (ref 39.0–52.0)
HEMOGLOBIN: 7.1 g/dL — AB (ref 13.0–17.0)
LYMPHS ABS: 2.1 10*3/uL (ref 0.7–4.0)
Lymphocytes Relative: 22 %
MCH: 29.1 pg (ref 26.0–34.0)
MCHC: 30.7 g/dL (ref 30.0–36.0)
MCV: 94.7 fL (ref 78.0–100.0)
MONOS PCT: 6 %
Monocytes Absolute: 0.6 10*3/uL (ref 0.1–1.0)
NEUTROS PCT: 70 %
Neutro Abs: 6.6 10*3/uL (ref 1.7–7.7)
Platelets: 355 10*3/uL (ref 150–400)
RBC: 2.44 MIL/uL — ABNORMAL LOW (ref 4.22–5.81)
RDW: 19.1 % — ABNORMAL HIGH (ref 11.5–15.5)
WBC: 9.4 10*3/uL (ref 4.0–10.5)

## 2017-05-29 LAB — COMPREHENSIVE METABOLIC PANEL
ALT: 22 U/L (ref 17–63)
ANION GAP: 17 — AB (ref 5–15)
AST: 21 U/L (ref 15–41)
Albumin: 2.9 g/dL — ABNORMAL LOW (ref 3.5–5.0)
Alkaline Phosphatase: 120 U/L (ref 38–126)
BILIRUBIN TOTAL: 0.7 mg/dL (ref 0.3–1.2)
BUN: 84 mg/dL — ABNORMAL HIGH (ref 6–20)
CALCIUM: 7.8 mg/dL — AB (ref 8.9–10.3)
CO2: 24 mmol/L (ref 22–32)
Chloride: 96 mmol/L — ABNORMAL LOW (ref 101–111)
Creatinine, Ser: 14.73 mg/dL — ABNORMAL HIGH (ref 0.61–1.24)
GFR calc non Af Amer: 3 mL/min — ABNORMAL LOW (ref >60)
GFR, EST AFRICAN AMERICAN: 4 mL/min — AB (ref >60)
Glucose, Bld: 100 mg/dL — ABNORMAL HIGH (ref 65–99)
Potassium: 3.8 mmol/L (ref 3.5–5.1)
SODIUM: 137 mmol/L (ref 135–145)
TOTAL PROTEIN: 8.2 g/dL — AB (ref 6.5–8.1)

## 2017-05-29 LAB — GLUCOSE, CAPILLARY: GLUCOSE-CAPILLARY: 85 mg/dL (ref 65–99)

## 2017-05-29 MED ORDER — HYDROCODONE-ACETAMINOPHEN 5-325 MG PO TABS: 1.0000 | ORAL_TABLET | Freq: Two times a day (BID) | ORAL | Status: DC | PRN

## 2017-05-29 MED ORDER — POLYETHYLENE GLYCOL 3350 17 G PO PACK: 17.0000 g | PACK | Freq: Every day | ORAL | Status: DC | PRN

## 2017-05-29 MED ORDER — VANCOMYCIN HCL IN DEXTROSE 1-5 GM/200ML-% IV SOLN
1000.0000 mg | INTRAVENOUS | Status: DC
  Filled 2017-05-29: qty 200

## 2017-05-29 MED ORDER — ALTEPLASE 2 MG IJ SOLR
INTRAMUSCULAR | Status: AC
  Administered 2017-05-29: 2 mg
  Filled 2017-05-29: qty 4

## 2017-05-29 MED ORDER — ONDANSETRON HCL 4 MG/2ML IJ SOLN: 4.0000 mg | Freq: Four times a day (QID) | INTRAMUSCULAR | Status: DC | PRN

## 2017-05-29 MED ORDER — SODIUM CHLORIDE 0.9% FLUSH
3.0000 mL | Freq: Two times a day (BID) | INTRAVENOUS | Status: DC
  Administered 2017-05-30 – 2017-06-01 (×5): 3 mL via INTRAVENOUS

## 2017-05-29 MED ORDER — ONDANSETRON HCL 4 MG PO TABS: 4.0000 mg | ORAL_TABLET | Freq: Four times a day (QID) | ORAL | Status: DC | PRN

## 2017-05-29 MED ORDER — INSULIN ASPART 100 UNIT/ML ~~LOC~~ SOLN
35.0000 [IU] | Freq: Three times a day (TID) | SUBCUTANEOUS | Status: DC
  Administered 2017-05-30 (×2): 35 [IU] via SUBCUTANEOUS

## 2017-05-29 MED ORDER — CLONAZEPAM 1 MG PO TABS
2.0000 mg | ORAL_TABLET | Freq: Every evening | ORAL | Status: DC
  Administered 2017-05-29 – 2017-05-30 (×2): 2 mg via ORAL
  Filled 2017-05-29 (×4): qty 2

## 2017-05-29 MED ORDER — INSULIN DETEMIR 100 UNIT/ML ~~LOC~~ SOLN
70.0000 [IU] | Freq: Every day | SUBCUTANEOUS | Status: DC
  Administered 2017-05-29 – 2017-05-30 (×2): 70 [IU] via SUBCUTANEOUS
  Filled 2017-05-29 (×3): qty 0.7

## 2017-05-29 MED ORDER — PANTOPRAZOLE SODIUM 40 MG PO TBEC
40.0000 mg | DELAYED_RELEASE_TABLET | Freq: Every day | ORAL | Status: DC
  Administered 2017-05-30 – 2017-06-01 (×3): 40 mg via ORAL
  Filled 2017-05-29 (×3): qty 1

## 2017-05-29 MED ORDER — ALLOPURINOL 300 MG PO TABS
600.0000 mg | ORAL_TABLET | Freq: Every day | ORAL | Status: DC
  Administered 2017-05-29 – 2017-06-01 (×4): 600 mg via ORAL
  Filled 2017-05-29 (×4): qty 2

## 2017-05-29 MED ORDER — CINACALCET HCL 30 MG PO TABS: 60.0000 mg | ORAL_TABLET | Freq: Every day | ORAL | Status: DC

## 2017-05-29 MED ORDER — WARFARIN SODIUM 5 MG PO TABS
5.0000 mg | ORAL_TABLET | Freq: Every day | ORAL | Status: AC
  Administered 2017-05-29: 5 mg via ORAL
  Filled 2017-05-29: qty 1

## 2017-05-29 MED ORDER — COLCHICINE 0.6 MG PO TABS: 1.2000 mg | ORAL_TABLET | Freq: Every day | ORAL | Status: DC

## 2017-05-29 MED ORDER — COLCHICINE 0.6 MG PO TABS
1.2000 mg | ORAL_TABLET | Freq: Every day | ORAL | Status: DC
  Administered 2017-05-30 – 2017-06-01 (×3): 1.2 mg via ORAL
  Filled 2017-05-29 (×3): qty 2

## 2017-05-29 MED ORDER — CINACALCET HCL 30 MG PO TABS
60.0000 mg | ORAL_TABLET | Freq: Every day | ORAL | Status: DC
  Administered 2017-05-30 – 2017-05-31 (×2): 60 mg via ORAL
  Filled 2017-05-29 (×3): qty 2

## 2017-05-29 MED ORDER — CALCIUM ACETATE (PHOS BINDER) 667 MG PO CAPS
2001.0000 mg | ORAL_CAPSULE | Freq: Three times a day (TID) | ORAL | Status: DC
  Administered 2017-05-30 – 2017-05-31 (×4): 2001 mg via ORAL
  Filled 2017-05-29 (×4): qty 3

## 2017-05-29 MED ORDER — WARFARIN - PHARMACIST DOSING INPATIENT: Freq: Every day | Status: DC

## 2017-05-29 MED ORDER — GABAPENTIN 300 MG PO CAPS
300.0000 mg | ORAL_CAPSULE | Freq: Three times a day (TID) | ORAL | Status: DC | PRN
  Administered 2017-05-31: 300 mg via ORAL
  Filled 2017-05-29: qty 1

## 2017-05-29 MED ORDER — LANTHANUM CARBONATE 500 MG PO CHEW
1000.0000 mg | CHEWABLE_TABLET | Freq: Three times a day (TID) | ORAL | Status: DC
  Administered 2017-05-30 (×3): 1000 mg via ORAL
  Filled 2017-05-29 (×3): qty 2

## 2017-05-29 MED ORDER — NORTRIPTYLINE HCL 10 MG PO CAPS
10.0000 mg | ORAL_CAPSULE | Freq: Every day | ORAL | Status: DC
  Administered 2017-05-29 – 2017-05-31 (×3): 10 mg via ORAL
  Filled 2017-05-29 (×4): qty 1

## 2017-05-29 NOTE — Progress Notes (Addendum)
[facility] Progress Note  Background: Shane Hamilton is an 51 y.o. 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 He is a very difficult HD access pt followed by [facility]. He was admitted here on 8/21- 05/26/17 for malfunctioning R fem HD cath and fevers.  He grew out CNSS 2/2 in blood cx's.  He tolerated HD here w/ some positional function but didn't require cath replacement.  He was dc'd to receive OP Vanc with HD for 2 wks for the CNSS bacteremia.  He returns to ED now as he did not get HD today due to malfunction again of his R fem HD cath.   Per [facility] pt has not had RUE access but has known occlusion of R central veins (brachiocephalic, etc) and is not a candidate for RUE access.  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  Dr Edilia Bo did extensive investigation of the L upper arm vessels. The brachial vein was found to be occluded.  It was opened up along its entirety up into the axilla but the entire vein was chronically occluded.  There were venous collaterals but none of adequate size for outflow. "After thorough exploration it was felt there was no options for graft in the left arm".   We are asked to see for ESRD.    Recent admits:  - April 2018 >> new left thigh AV graft (4-7 mm PTFE graft) by Dr Edilia Bo - June 2017 >> admit for MSSA bacteremia due to HD cath infection; cath removed. IR had problems placing new HD cath so new cath was replaced by [facility] in R leg. Chronic coumadin for hx PE and for chronic access patency.  - June 2017 >> acute / chron resp failure due to decomp diast CHF and OSA/ OHS - Sept 2016 >> AVF abscess/ acute on chron resp failure - pt intubated on vent due to sepsis. Wounds cx's grew MSSA.  AVF excised by  [facility].  TDC placed.  DM2.    Home meds:   -Allopurinol, colchicine, Levemir insulin , Novolog,  Coumadin, prilosec -klonopin, neurontin, norco, Pamelor, roxicet, ultram, -phoslo,  fosrenol   Subjective: patient w/o SOB, cough or abd pain, no n/v/d, no confusion  Vitals:   05/29/17 1103  BP: (!) 157/89  Pulse: (!) 106  Resp: 18  Temp: 98.4 F (36.9 C)  TempSrc: Oral  SpO2: 100%    Inpatient medications: . allopurinol  600 mg Oral Daily      Exam: Morbid obesity, no distress, calm and alert No jvd Chest clear bilat RRR no mrg Abd obese soft ntnd Ext R finger amputations (old), 1-2+ ankle edema bilat NF, ox 3 R fem HD cath, clean exit site  Dialysis: TTS   6h   400/800   181kg  2k/2.5 bath  (from last admit)      Impression: 1.  Malfunctioning HD cath - recurrent issue, couldn't get HD today.  Plan admit, TPA cath and attempt HD tonight.  Either way will ask surgery and/or  IR to see for cath replacement.   2.  ESRD on HD TTS 3.  Vol - some LE edema 1-2+ 4.  Morbid obesity 5.  MSSA bacteremia - prob TDC infection, getting IV vanc  w/ HD for this, 2wks total thru Sept 4th; cath was not replaced. 6.  Gout 7.  MBD - cont binders. Get OP records 8.  Anemia of CKD - Hb low 7's, may need transfusion while here.  Get OP records 9.  DM on insulin 10. Anxiety/ depression - cont meds 11.  Hx PE on coumadin - check INR in am    Plan - as above   Vinson Moselle MD [facility] pager [phone]   05/29/2017, 5:48 PM    Recent Labs Lab 05/25/17 1348 05/26/17 0522 05/29/17 1141  NA 135 134* 137  K 4.3 4.3 3.8  CL 97* 97* 96*  CO2 23 23 24   GLUCOSE 156* 154* 100*  BUN 92* 75* 84*  CREATININE 15.29* 13.18* 14.73*  CALCIUM 8.6* 8.8* 7.8*  PHOS  --  3.6  --     Recent Labs Lab 05/25/17 1348 05/29/17 1141  AST 26 21  ALT 24 22  ALKPHOS 131* 120  BILITOT 0.6 0.7  PROT 8.2* 8.2*  ALBUMIN 3.0* 2.9*    Recent Labs Lab 05/25/17 1348  05/26/17 0522 05/29/17 1141  WBC 8.9 10.0 9.4  NEUTROABS  --   --  6.6  HGB 7.6* 7.3* 7.1*  HCT 24.7* 24.3* 23.1*  MCV 95.0 95.7 94.7  PLT 341 318 355   Iron/TIBC/Ferritin/ %Sat    Component Value Date/Time   IRON 15 (L) 05/26/2017 0522   TIBC 277 05/26/2017 0522   FERRITIN 897 (H) 05/26/2017 0522   IRONPCTSAT 5 (L) 05/26/2017 0522

## 2017-05-29 NOTE — Progress Notes (Signed)
ANTICOAGULATION CONSULT NOTE - Initial Consult  Pharmacy Consult for warfarin Indication: history of PE  Allergies  Allergen Reactions  . Aspirin Other (See Comments)    GI upset     Patient Measurements:     Vital Signs: Temp: 98.4 F (36.9 C) (08/25 1103) Temp Source: Oral (08/25 1103) BP: 141/87 (08/25 1816) Pulse Rate: 105 (08/25 1816)  Labs:  Recent Labs  05/29/17 1141  HGB 7.1*  HCT 23.1*  PLT 355  CREATININE 14.73*    Estimated Creatinine Clearance: 9.9 mL/min (A) (by C-G formula based on SCr of 14.73 mg/dL (H)).   Medical History: Past Medical History:  Diagnosis Date  . Abscess of right arm 06/17/2015  . Anemia   . Anxiety   . Arteriovenous fistula infection (HCC)   . Cardiomegaly   . Chronic anticoagulation   . Chronic pancreatitis (HCC)   . DVT (deep venous thrombosis) (HCC) 2011  . ESRD (end stage renal disease) (HCC)    [facility] [place]  . GERD (gastroesophageal reflux disease)   . Gout   . Headache    migraines  . History of kidney stones   . Hypertension   . Kidney stones   . LOC (loss of consciousness) (HCC)   . Morbid obesity (HCC)   . OSA (obstructive sleep apnea)    wears CPAP  . Pneumonia   . Secondary hyperparathyroidism (HCC)   . Septic shock(785.52)   . Shortness of breath dyspnea    with exertion  . Type 2 diabetes mellitus, uncontrolled (HCC)    Type 2     Assessment: 51 YOM with history of PE on warfarin- home dose 5mg  daily with last dose on 8/24. INR was 1.47 on 8/22.   Goal of Therapy:  INR 2-3 Monitor platelets by anticoagulation protocol: Yes   Plan:  Warfarin 5mg  po x1 tonight Daily INR  Shalanda Brogden D. Norbert Malkin, PharmD, BCPS Clinical Pharmacist 05/29/2017 8:49 PM

## 2017-05-29 NOTE — ED Triage Notes (Signed)
[facility] d/t right thigh cath not working. He was given cathflo with no success. PT HGB 6.8. Pt was unable to have dialysis today d/t no access. Pt has hx of same.

## 2017-05-29 NOTE — ED Provider Notes (Signed)
[facility]Y DEPT Provider Note   CSN: [id number] Arrival date & time: 05/29/17  1025     History   Chief Complaint Chief Complaint  Patient presents with  . Vascular Access Problem    HPI KALETH Hamilton is a 51 y.o. male.  He presents for evaluation of nonfunctional right groin dialysis catheter.  He went to dialysis today for his usual scheduled dialysis, and was not able to be dialyzed, because the catheter did not work.  According to the patient this is been a recurrent problem for several months since it was placed.  Apparently it has been used with efforts to partially mobilize the access.  He denies fever, cough, shortness of breath, weakness or dizziness.  He has a permanent graft, left thigh, which is not currently functional.  There are no other known modifying factors.  HPI  Past Medical History:  Diagnosis Date  . Abscess of right arm 06/17/2015  . Anemia   . Anxiety   . Arteriovenous fistula infection (HCC)   . Cardiomegaly   . Chronic anticoagulation   . Chronic pancreatitis (HCC)   . DVT (deep venous thrombosis) (HCC) 2011  . ESRD (end stage renal disease) (HCC)    [facility] [place]  . GERD (gastroesophageal reflux disease)   . Gout   . Headache    migraines  . History of kidney stones   . Hypertension   . Kidney stones   . LOC (loss of consciousness) (HCC)   . Morbid obesity (HCC)   . OSA (obstructive sleep apnea)    wears CPAP  . Pneumonia   . Secondary hyperparathyroidism (HCC)   . Septic shock(785.52)   . Shortness of breath dyspnea    with exertion  . Type 2 diabetes mellitus, uncontrolled (HCC)    Type 2    Patient Active Problem List   Diagnosis Date Noted  . Coagulase negative Staphylococcus bacteremia 05/26/2017  . SOB (shortness of breath) 05/25/2017  . Anemia due to end stage renal disease (HCC) 01/15/2017  . Diabetic polyneuropathy associated with diabetes mellitus due to underlying condition (HCC) 09/09/2016  . NSVT  (nonsustained ventricular tachycardia) (HCC) 03/29/2016  . Bacteremia 03/27/2016  . Fever 03/26/2016  . Sepsis (HCC) 03/26/2016  . End-stage renal disease on hemodialysis (HCC)   . Gout 03/04/2016  . Atherosclerosis of native arteries of extremity with intermittent claudication (HCC) 10/11/2015  . AV fistula infection (HCC) 06/18/2015  . DVT (deep venous thrombosis) (HCC) 06/17/2015  . Chronic anticoagulation   . Type 2 diabetes mellitus, uncontrolled (HCC)   . Morbid obesity (HCC)   . Seizures (HCC)   . Obstructive sleep apnea 08/06/2011    Past Surgical History:  Procedure Laterality Date  . AMPUTATION FINGER / THUMB Right   . ANGIOPLASTY ILLIAC ARTERY Right 05/15/2016   Procedure: FIBRIN SHEATH ANGIOPLASTY;  Surgeon: Larina Earthly, MD;  Location: [facility] OR;  Service: Vascular;  Laterality: Right;  . APPLICATION OF WOUND VAC Left 06/20/2015   Procedure: APPLICATION OF WOUND VAC;  Surgeon: Renford Dills, MD;  Location: [facility] ORS;  Service: Vascular;  Laterality: Left;  . APPLICATION OF WOUND VAC Left 01/15/2017   AV GRAFT  . AV FISTULA PLACEMENT    . AV FISTULA PLACEMENT Left 06/20/2015   Procedure: ARTERIOVENOUS (AV) FISTULA  ligation , excision;  Surgeon: Renford Dills, MD;  Location: [facility] ORS;  Service: Vascular;  Laterality: Left;  . AV FISTULA PLACEMENT Left 12/04/2016   Procedure: EXPLORATION LEFT UPPER  ARM;  Surgeon: Chuck Hint, MD;  Location: [facility] OR;  Service: Vascular;  Laterality: Left;  . AV FISTULA PLACEMENT Left 01/15/2017   Procedure: INSERTION OF ARTERIOVENOUS (AV) GORE-TEX STRETCH GRAFT THIGH;  Surgeon: Chuck Hint, MD;  Location: [facility] OR;  Service: Vascular;  Laterality: Left;  . EXCHANGE OF A DIALYSIS CATHETER Left 11/13/2015   Procedure: EXCHANGE OF A DIALYSIS CATHETER- LEFT INTERNAL JUGULAR;  Surgeon: Nada Libman, MD;  Location: [facility] OR;  Service: Vascular;  Laterality: Left;  . EXCHANGE OF A DIALYSIS CATHETER Right 05/15/2016   Procedure: EXCHANGE  OF A RIGHT FEMORAL DIALYSIS CATHETER;  Surgeon: Larina Earthly, MD;  Location: [facility] OR;  Service: Vascular;  Laterality: Right;  . I&D EXTREMITY Left 06/20/2015   Procedure: IRRIGATION AND DEBRIDEMENT EXTREMITY;  Surgeon: Renford Dills, MD;  Location: [facility] ORS;  Service: Vascular;  Laterality: Left;  . INSERTION OF DIALYSIS CATHETER N/A 06/25/2015   Procedure: place a tunnelled catheter for long term use;  Surgeon: Annice Needy, MD;  Location: [facility] ORS;  Service: Vascular;  Laterality: N/A;  . INSERTION OF DIALYSIS CATHETER Left 07/25/2015   Procedure: INSERTION OF LEFT INTERNAL JUGULAR DIALYSIS CATHETER;  Surgeon: Pryor Ochoa, MD;  Location: [facility] OR;  Service: Vascular;  Laterality: Left;  . INSERTION OF DIALYSIS CATHETER Left 08/01/2015   Procedure: REMOVAL OF DIALYSIS CATHETER; PLACEMNET OF NEW DIALYSIS CATHETER;  Surgeon: Pryor Ochoa, MD;  Location: [facility] OR;  Service: Vascular;  Laterality: Left;  . INSERTION OF DIALYSIS CATHETER Right 03/31/2016   Procedure: INSERTION OF DIALYSIS CATHETER RIGHT FEMORAL ;  Surgeon: Chuck Hint, MD;  Location: [facility] OR;  Service: Vascular;  Laterality: Right;  . IR FLUORO GUIDE CV LINE RIGHT  01/20/2017  . IR FLUORO GUIDE CV LINE RIGHT  01/27/2017  . IR FLUORO GUIDE CV LINE RIGHT  02/03/2017  . IR GENERIC HISTORICAL  05/28/2016   IR FLUORO GUIDE CV LINE RIGHT 05/28/2016 Berdine Dance, MD [facility]  . IR GENERIC HISTORICAL  06/11/2016   IR FLUORO GUIDE CV LINE RIGHT 06/11/2016 [facility]  . IR THROMBECTOMY AV FISTULA W/THROMBOLYSIS/PTA INC/SHUNT/IMG LEFT Left 02/22/2017  . IR US GUIDE VASC ACCESS LEFT  02/22/2017  . IR VENOCAVAGRAM IVC  02/03/2017  . PERIPHERAL VASCULAR CATHETERIZATION N/A 06/25/2015   Procedure: Dialysis/Perma Catheter Insertion;  Surgeon: Annice Needy, MD;  Location: [facility] INVASIVE CV LAB;  Service: Cardiovascular;  Laterality: N/A;  . PERIPHERAL VASCULAR CATHETERIZATION Bilateral 09/13/2015   Procedure: Upper Extremity Venography;  Surgeon:  Sherren Kerns, MD;  Location: [facility] INVASIVE CV LAB;  Service: Cardiovascular;  Laterality: Bilateral;  . THROMBECTOMY AND REVISION OF ARTERIOVENTOUS (AV) GORETEX  GRAFT Left 03/05/2017   Procedure: THROMBECTOMY AND REVISION OF ARTERIOVENTOUS (AV) GORETEX  GRAFT LEFT THIGH;  Surgeon: Chuck Hint, MD;  Location: [facility] OR;  Service: Vascular;  Laterality: Left;       Home Medications    Prior to Admission medications   Medication Sig Start Date End Date Taking? Authorizing Provider  allopurinol (ZYLOPRIM) 300 MG tablet Take 600 mg by mouth daily.     [provider]  calcium acetate (PHOSLO) 667 MG capsule Take 2,001 mg by mouth 3 (three) times daily with meals. Also with snacks    [provider]  cinacalcet (SENSIPAR) 60 MG tablet Take 60 mg by mouth daily.     [provider]  clonazePAM (KLONOPIN) 1 MG tablet Take 1 tablet (1 mg total) by mouth 2 (  two) times daily as needed for anxiety. Patient taking differently: Take 2 mg by mouth every evening.  12/04/16   Raymond Gurney, PA-C  colchicine 0.6 MG tablet Take 1.2 mg by mouth daily.     [provider]  gabapentin (NEURONTIN) 300 MG capsule Take 1 capsule (300 mg total) by mouth 3 (three) times daily as needed (pain). 12/04/16   Raymond Gurney, PA-C  HYDROcodone-acetaminophen (NORCO/VICODIN) 5-325 MG tablet Take 1-2 tablets by mouth every 12 (twelve) hours as needed for severe pain. 04/14/17   Russo, [place] N, PA-C  lanthanum (FOSRENOL) 1000 MG chewable tablet Chew 1,000 mg by mouth 3 (three) times daily with meals. 01/27/17   [provider]  LEVEMIR FLEXTOUCH 100 UNIT/ML Pen INJECT 70 UNITS UNDER THE SKIN QHS 08/06/16   [provider]  lidocaine (XYLOCAINE) 5 % ointment Apply to feet twice daily as needed.  Use with gloves/cotton swab 09/09/16   Nita Sickle K, DO  multivitamin (RENA-VIT) TABS tablet Take 1 tablet by mouth daily.  12/05/14   [provider]  nortriptyline  (PAMELOR) 10 MG capsule Take 1 capsule (10 mg total) by mouth at bedtime. 09/09/16   Patel, Donika K, DO  NOVOLOG FLEXPEN 100 UNIT/ML FlexPen Inject 35 Units as directed 3 (three) times daily with meals. 04/20/16   [provider]  omeprazole (PRILOSEC) 40 MG capsule Take 40 mg by mouth daily 01/06/17   [provider]  oxyCODONE-acetaminophen (ROXICET) 5-325 MG tablet Take 1-2 tablets by mouth every 4 (four) hours as needed. Patient taking differently: Take 1-2 tablets by mouth every 4 (four) hours as needed for severe pain.  03/05/17   Chuck Hint, MD  Tetrahydrozoline HCl (VISINE OP) Place 1 drop into both eyes daily as needed (dry eyes).     [provider]  traMADol (ULTRAM) 50 MG tablet Take 1 tablet (50 mg total) by mouth every 6 (six) hours as needed for moderate pain. 01/15/17   Rhyne, Ames Coupe, PA-C  warfarin (COUMADIN) 5 MG tablet Take 5 mg by mouth daily 10/30/16   [provider]    Family History Family History  Problem Relation Age of Onset  . Diabetes Mother   . Hypertension Mother   . Diabetes Father   . Hypertension Father   . Hypertension Brother     Social History Social History  Substance Use Topics  . Smoking status: Never Smoker  . Smokeless tobacco: Never Used  . Alcohol use No     Allergies   Aspirin   Review of Systems Review of Systems  All other systems reviewed and are negative.    Physical Exam Updated Vital Signs BP (!) 157/89 (BP Location: Left Arm)   Pulse (!) 106   Temp 98.4 F (36.9 C) (Oral)   Resp 18   SpO2 100%   Physical Exam  Constitutional: He is oriented to person, place, and time. He appears well-developed.  HENT:  Head: Normocephalic and atraumatic.  Right Ear: External ear normal.  Left Ear: External ear normal.  Eyes: Pupils are equal, round, and reactive to light. Conjunctivae and EOM are normal.  Neck: Normal range of motion and phonation normal. Neck supple.    Cardiovascular: Normal rate.   Dialysis catheter right anterior proximal thigh, site implants appear normal.  Graft left anterior thigh without palpable pulse, no overlying redness or tenderness.  Pulmonary/Chest: Effort normal. He exhibits no bony tenderness.  Musculoskeletal: Normal range of motion.  Neurological: He is  alert and oriented to person, place, and time. No cranial nerve deficit or sensory deficit. He exhibits normal muscle tone. Coordination normal.  Skin: Skin is warm, dry and intact.  Psychiatric: He has a normal mood and affect. His behavior is normal. Judgment and thought content normal.  Nursing note and vitals reviewed.    ED Treatments / Results  Labs (all labs ordered are listed, but only abnormal results are displayed) Labs Reviewed  CBC WITH DIFFERENTIAL/PLATELET - Abnormal; Notable for the following:       Result Value   RBC 2.44 (*)    Hemoglobin 7.1 (*)    HCT 23.1 (*)    RDW 19.1 (*)    All other components within normal limits  COMPREHENSIVE METABOLIC PANEL - Abnormal; Notable for the following:    Chloride 96 (*)    Glucose, Bld 100 (*)    BUN 84 (*)    Creatinine, Ser 14.73 (*)    Calcium 7.8 (*)    Total Protein 8.2 (*)    Albumin 2.9 (*)    GFR calc non Af Amer 3 (*)    GFR calc Af Amer 4 (*)    Anion gap 17 (*)    All other components within normal limits  TYPE AND SCREEN    EKG  EKG Interpretation None       Radiology No results found.  Procedures Procedures (including critical care time)  Medications Ordered in ED Medications - No data to display   Initial Impression / Assessment and Plan / ED Course  I have reviewed the triage vital signs and the nursing notes.  Pertinent labs & imaging results that were available during my care of the patient were reviewed by me and considered in my medical decision making (see chart for details).      Patient Vitals for the past 24 hrs:  BP Temp Temp src Pulse Resp SpO2   05/29/17 1103 (!) 157/89 98.4 F (36.9 C) Oral (!) 106 18 100 %    15: 00-requested called back from nephrology, regarding nonfunctional dialysis catheter.  Call received back from Dr. Letitia Caul who will see the patient, and recommends admission for failure of dialysis device.  3:01 PM Reevaluation with update and discussion. After initial assessment and treatment, an updated evaluation reveals no change in clinical status.  Findings discussed with the patient and all questions answered. Wang Granada L   3: 4 5 PM-Consult complete with hospitalist. Patient case explained and discussed.  She agrees to admit patient for further evaluation and treatment. Call ended at 17: 15   Final Clinical Impressions(s) / ED Diagnoses   Final diagnoses:  End stage renal disease (HCC)  Mechanical breakdown of vascular dialysis catheter, initial encounter (HCC)   End-stage renal disease failed dialysis today because of nonfunctioning right thigh dialysis catheter.  This is a recurrent problem.  He is currently being treated for coag negative bacteremia with IV vancomycin 3 times a week, at dialysis.  Screening evaluation today indicates that he is stable and does not need immediate dialysis.  Patient has a nonfunctioning left thigh graft, as well as a nonfunctioning right dialysis catheter.  He will be admitted for obtaining access, while being monitored.  Nursing Notes Reviewed/ Care Coordinated Applicable Imaging Reviewed Interpretation of Laboratory Data incorporated into ED treatment  Plan: Admit  New Prescriptions New Prescriptions   No medications on file     Mancel Bale, MD 05/30/17 1045

## 2017-05-29 NOTE — Progress Notes (Signed)
Received to floor.  No active distress.  Settled into room and oriented to routine, staff, call bell, and safety policy.  Hemodialysis nurse in to see patient, IV team in to see patient.

## 2017-05-29 NOTE — H&P (Addendum)
History and Physical:    Shane Hamilton   NWG:[id number] DOB: April 08, 1966 DOA: 05/29/2017  Referring MD/provider: Mancel Bale, MD PCP: Camille Bal, MD   Patient coming from: Home  Chief Complaint: HD catheter not working  History of Present Illness:   Shane Hamilton is an 51 y.o. male with a PMH of uncontrolled diabetes, ESRD on HD TTHS, morbid obesity, OSA on CPAP, hypertension, and DVT on chronic anticoagulation who was admitted 05/25/17-05/26/17 for evaluation of generalized weakness. Of note, unable to have dialysis on the date of admission due to catheter issue, but catheter was functional during his hospital stay and he was successfully dialyzed. Blood cultures were positive for Staphylococcus lugdunensis (coagulase negative), and ID did recommend 2 weeks of vancomycin with HD. He subsequently was discharged home and went to his dialysis treatment today, but once again his catheter was nonfunctional and could not be dialyzed and therefore he was sent here.  ED Course:  The patient had a normal potassium, creatinine 14.73.  ROS:   Review of Systems  Constitutional: Negative for chills and fever.  HENT: Negative.   Eyes: Negative.   Respiratory: Negative.   Cardiovascular: Negative.   Gastrointestinal: Negative.   Genitourinary: Negative.   Musculoskeletal: Positive for myalgias.  Skin: Negative.   Neurological: Positive for weakness.  Endo/Heme/Allergies: Negative.   Psychiatric/Behavioral: Negative.     Past Medical History:   Past Medical History:  Diagnosis Date  . Abscess of right arm 06/17/2015  . Anemia   . Anxiety   . Arteriovenous fistula infection (HCC)   . Cardiomegaly   . Chronic anticoagulation   . Chronic pancreatitis (HCC)   . DVT (deep venous thrombosis) (HCC) 2011  . ESRD (end stage renal disease) (HCC)    [facility]S - [place]  . GERD (gastroesophageal reflux disease)   . Gout   . Headache    migraines  . History of kidney stones    . Hypertension   . Kidney stones   . LOC (loss of consciousness) (HCC)   . Morbid obesity (HCC)   . OSA (obstructive sleep apnea)    wears CPAP  . Pneumonia   . Secondary hyperparathyroidism (HCC)   . Septic shock(785.52)   . Shortness of breath dyspnea    with exertion  . Type 2 diabetes mellitus, uncontrolled (HCC)    Type 2    Past Surgical History:   Past Surgical History:  Procedure Laterality Date  . AMPUTATION FINGER / THUMB Right   . ANGIOPLASTY ILLIAC ARTERY Right 05/15/2016   Procedure: FIBRIN SHEATH ANGIOPLASTY;  Surgeon: Larina Earthly, MD;  Location: [facility] OR;  Service: Vascular;  Laterality: Right;  . APPLICATION OF WOUND VAC Left 06/20/2015   Procedure: APPLICATION OF WOUND VAC;  Surgeon: Renford Dills, MD;  Location: [facility] ORS;  Service: Vascular;  Laterality: Left;  . APPLICATION OF WOUND VAC Left 01/15/2017   AV GRAFT  . AV FISTULA PLACEMENT    . AV FISTULA PLACEMENT Left 06/20/2015   Procedure: ARTERIOVENOUS (AV) FISTULA  ligation , excision;  Surgeon: Renford Dills, MD;  Location: [facility] ORS;  Service: Vascular;  Laterality: Left;  . AV FISTULA PLACEMENT Left 12/04/2016   Procedure: EXPLORATION LEFT UPPER ARM;  Surgeon: Chuck Hint, MD;  Location: [facility] OR;  Service: Vascular;  Laterality: Left;  . AV FISTULA PLACEMENT Left 01/15/2017   Procedure: INSERTION OF ARTERIOVENOUS (AV) GORE-TEX STRETCH GRAFT THIGH;  Surgeon: Chuck Hint, MD;  Location: [facility]  OR;  Service: Vascular;  Laterality: Left;  . EXCHANGE OF A DIALYSIS CATHETER Left 11/13/2015   Procedure: EXCHANGE OF A DIALYSIS CATHETER- LEFT INTERNAL JUGULAR;  Surgeon: Nada Libman, MD;  Location: [facility] OR;  Service: Vascular;  Laterality: Left;  . EXCHANGE OF A DIALYSIS CATHETER Right 05/15/2016   Procedure: EXCHANGE OF A RIGHT FEMORAL DIALYSIS CATHETER;  Surgeon: Larina Earthly, MD;  Location: [facility] OR;  Service: Vascular;  Laterality: Right;  . I&D EXTREMITY Left 06/20/2015   Procedure: IRRIGATION AND  DEBRIDEMENT EXTREMITY;  Surgeon: Renford Dills, MD;  Location: [facility] ORS;  Service: Vascular;  Laterality: Left;  . INSERTION OF DIALYSIS CATHETER N/A 06/25/2015   Procedure: place a tunnelled catheter for long term use;  Surgeon: Annice Needy, MD;  Location: [facility] ORS;  Service: Vascular;  Laterality: N/A;  . INSERTION OF DIALYSIS CATHETER Left 07/25/2015   Procedure: INSERTION OF LEFT INTERNAL JUGULAR DIALYSIS CATHETER;  Surgeon: Pryor Ochoa, MD;  Location: [facility] OR;  Service: Vascular;  Laterality: Left;  . INSERTION OF DIALYSIS CATHETER Left 08/01/2015   Procedure: REMOVAL OF DIALYSIS CATHETER; PLACEMNET OF NEW DIALYSIS CATHETER;  Surgeon: Pryor Ochoa, MD;  Location: [facility] OR;  Service: Vascular;  Laterality: Left;  . INSERTION OF DIALYSIS CATHETER Right 03/31/2016   Procedure: INSERTION OF DIALYSIS CATHETER RIGHT FEMORAL ;  Surgeon: Chuck Hint, MD;  Location: [facility] OR;  Service: Vascular;  Laterality: Right;  . IR FLUORO GUIDE CV LINE RIGHT  01/20/2017  . IR FLUORO GUIDE CV LINE RIGHT  01/27/2017  . IR FLUORO GUIDE CV LINE RIGHT  02/03/2017  . IR GENERIC HISTORICAL  05/28/2016   IR FLUORO GUIDE CV LINE RIGHT 05/28/2016 Berdine Dance, MD [facility]-INTERV RAD  . IR GENERIC HISTORICAL  06/11/2016   IR FLUORO GUIDE CV LINE RIGHT 06/11/2016 [facility]  . IR THROMBECTOMY AV FISTULA W/THROMBOLYSIS/PTA INC/SHUNT/IMG LEFT Left 02/22/2017  . IR US GUIDE VASC ACCESS LEFT  02/22/2017  . IR VENOCAVAGRAM IVC  02/03/2017  . PERIPHERAL VASCULAR CATHETERIZATION N/A 06/25/2015   Procedure: Dialysis/Perma Catheter Insertion;  Surgeon: Annice Needy, MD;  Location: [facility] INVASIVE CV LAB;  Service: Cardiovascular;  Laterality: N/A;  . PERIPHERAL VASCULAR CATHETERIZATION Bilateral 09/13/2015   Procedure: Upper Extremity Venography;  Surgeon: Sherren Kerns, MD;  Location: [facility] INVASIVE CV LAB;  Service: Cardiovascular;  Laterality: Bilateral;  . THROMBECTOMY AND REVISION OF ARTERIOVENTOUS (AV) GORETEX  GRAFT Left 03/05/2017    Procedure: THROMBECTOMY AND REVISION OF ARTERIOVENTOUS (AV) GORETEX  GRAFT LEFT THIGH;  Surgeon: Chuck Hint, MD;  Location: [facility] OR;  Service: Vascular;  Laterality: Left;    Social History:   Social History   Social History  . Marital status: Single    Spouse name: N/A  . Number of children: N/A  . Years of education: N/A   Occupational History  . disabled     Social History Main Topics  . Smoking status: Never Smoker  . Smokeless tobacco: Never Used  . Alcohol use No  . Drug use: No  . Sexual activity: Not on file   Other Topics Concern  . Not on file   Social History Narrative   Lives with a friend in a one story home.  Has one child.  On disability.  Education: some college.     Allergies   Aspirin  Family history:   Family History  Problem Relation Age of Onset  . Diabetes Mother   . Hypertension Mother   .  Diabetes Father   . Hypertension Father   . Hypertension Brother     Current Medications:   Prior to Admission medications   Medication Sig Start Date End Date Taking? Authorizing Provider  allopurinol (ZYLOPRIM) 300 MG tablet Take 600 mg by mouth daily.    Yes [provider]  calcium acetate (PHOSLO) 667 MG capsule Take 2,001 mg by mouth 3 (three) times daily with meals. Also with snacks   Yes [provider]  cinacalcet (SENSIPAR) 60 MG tablet Take 60 mg by mouth daily.    Yes [provider]  clonazePAM (KLONOPIN) 1 MG tablet Take 1 tablet (1 mg total) by mouth 2 (two) times daily as needed for anxiety. Patient taking differently: Take 2 mg by mouth every evening.  12/04/16  Yes Maris Berger A, PA-C  colchicine 0.6 MG tablet Take 1.2 mg by mouth daily.    Yes [provider]  gabapentin (NEURONTIN) 300 MG capsule Take 1 capsule (300 mg total) by mouth 3 (three) times daily as needed (pain). 12/04/16  Yes Raymond Gurney, PA-C  HYDROcodone-acetaminophen (NORCO/VICODIN) 5-325 MG tablet Take 1-2 tablets by  mouth every 12 (twelve) hours as needed for severe pain. 04/14/17  Yes Timothy Lasso, [place] N, PA-C  lanthanum (FOSRENOL) 1000 MG chewable tablet Chew 1,000 mg by mouth 3 (three) times daily with meals. 01/27/17  Yes [provider]  LEVEMIR FLEXTOUCH 100 UNIT/ML Pen INJECT 70 UNITS UNDER THE SKIN AT BEDTIME 08/06/16  Yes [provider]  lidocaine (XYLOCAINE) 5 % ointment Apply to feet twice daily as needed.  Use with gloves/cotton swab 09/09/16  Yes Patel, Donika K, DO  multivitamin (RENA-VIT) TABS tablet Take 1 tablet by mouth daily.  12/05/14  Yes [provider]  nortriptyline (PAMELOR) 10 MG capsule Take 1 capsule (10 mg total) by mouth at bedtime. 09/09/16  Yes Patel, Donika K, DO  NOVOLOG FLEXPEN 100 UNIT/ML FlexPen Inject 35 Units into the skin 3 (three) times daily with meals.  04/20/16  Yes [provider]  omeprazole (PRILOSEC) 40 MG capsule Take 40 mg by mouth daily 01/06/17  Yes [provider]  oxyCODONE-acetaminophen (ROXICET) 5-325 MG tablet Take 1-2 tablets by mouth every 4 (four) hours as needed. Patient taking differently: Take 1-2 tablets by mouth every 4 (four) hours as needed for severe pain.  03/05/17  Yes Chuck Hint, MD  Tetrahydrozoline HCl (VISINE OP) Place 1 drop into both eyes daily as needed (dry eyes).    Yes [provider]  traMADol (ULTRAM) 50 MG tablet Take 1 tablet (50 mg total) by mouth every 6 (six) hours as needed for moderate pain. 01/15/17  Yes Rhyne, Samantha J, PA-C  warfarin (COUMADIN) 5 MG tablet Take 5 mg by mouth daily 10/30/16  Yes [provider]    Physical Exam:   Vitals:   05/29/17 1103  BP: (!) 157/89  Pulse: (!) 106  Resp: 18  Temp: 98.4 F (36.9 C)  TempSrc: Oral  SpO2: 100%     Physical Exam: Blood pressure (!) 157/89, pulse (!) 106, temperature 98.4 F (36.9 C), temperature source Oral, resp. rate 18, SpO2 100 %. Gen: No acute distress.Morbidly obese African-American  male. Head: Normocephalic, atraumatic. Eyes: Pupils equal, round and reactive to light. Extraocular movements intact.  Sclerae nonicteric. No lid lag. Mouth: Oropharynx reveals moist mucous membranes. Dentition is good. Neck: Supple, no thyromegaly, no lymphadenopathy, no jugular venous distention. Chest: Lungs are clear to auscultation with good air movement. No  rales, rhonchi or wheezes.  CV: Heart sounds are regular with an S1, S2. No murmurs, rubs, clicks, or gallops.  Abdomen: Soft, nontender, nondistended with normal active bowel sounds. No hepatosplenomegaly or palpable masses. Extremities: Extremities are without clubbing, or cyanosis. + edema. Pedal pulses 2+. Right hand contractures and missing digits. HD catheter right thigh. No signs of infection. Skin: Warm and dry. No rashes, lesions or wounds. Neuro: Alert and oriented times 3; grossly nonfocal. Moves all extremities 4 equally. Psych: Insight is good and judgment is appropriate. Mood and affect normal.   Data Review:    Labs: Basic Metabolic Panel:  Recent Labs Lab 05/25/17 1348 05/26/17 0522 05/29/17 1141  NA 135 134* 137  K 4.3 4.3 3.8  CL 97* 97* 96*  CO2 23 23 24   GLUCOSE 156* 154* 100*  BUN 92* 75* 84*  CREATININE 15.29* 13.18* 14.73*  CALCIUM 8.6* 8.8* 7.8*  PHOS  --  3.6  --    Liver Function Tests:  Recent Labs Lab 05/25/17 1348 05/29/17 1141  AST 26 21  ALT 24 22  ALKPHOS 131* 120  BILITOT 0.6 0.7  PROT 8.2* 8.2*  ALBUMIN 3.0* 2.9*   CBC:  Recent Labs Lab 05/25/17 1348 05/26/17 0522 05/29/17 1141  WBC 8.9 10.0 9.4  NEUTROABS  --   --  6.6  HGB 7.6* 7.3* 7.1*  HCT 24.7* 24.3* 23.1*  MCV 95.0 95.7 94.7  PLT 341 318 355   CBG:  Recent Labs Lab 05/26/17 0436 05/26/17 0800 05/26/17 1134  GLUCAP 146* 158* 210*    Urinalysis    Component Value Date/Time   COLORURINE YELLOW 07/07/2010 1716   APPEARANCEUR CLEAR 07/07/2010 1716   LABSPEC 1.014 07/07/2010 1716   PHURINE 7.0  07/07/2010 1716   GLUCOSEU NEGATIVE 07/07/2010 1716   HGBUR SMALL (A) 07/07/2010 1716   BILIRUBINUR NEGATIVE 07/07/2010 1716   KETONESUR NEGATIVE 07/07/2010 1716   PROTEINUR 100 (A) 07/07/2010 1716   UROBILINOGEN 0.2 07/07/2010 1716   NITRITE NEGATIVE 07/07/2010 1716   LEUKOCYTESUR NEGATIVE 07/07/2010 1716    Radiographic Studies: No results found.  EKG: None ordered.   Assessment/Plan:   Principal Problem:   Staphylococcus lugdunensis bacteremia ID initially recommended 2 weeks of vancomycin with HD treatments. Continue vancomycin. Monitor for toxicity. We'll touch base with ID to see if they would like to change therapy now that speciation is back.  Active Problems:   Obstructive sleep apnea Continue CPAP at bedtime.    DVT (deep venous thrombosis) (HCC) Coumadin per pharmacy.    Type 2 diabetes mellitus, uncontrolled (HCC) Continue home regimen of Levemir 70 units with NovoLog meal coverage.    Morbid obesity (HCC) BMI is 55.04 kg/m.    Gout Continue colchicine and allopurinol.    End-stage renal disease on hemodialysis [facility]) Nephrology consultation for access/initiation of HD.    Diabetic polyneuropathy associated with diabetes mellitus due to underlying condition (HCC) Continue Neurontin.    Anemia due to end stage renal disease (HCC) Monitor hemoglobin.   Other information:   DVT prophylaxis: Coumadin ordered. Code Status: Full code. Family Communication: Male visitor at bedside.  Disposition Plan: Home when HD access obtained. Consults called: Nephrology. Admission status: Inpatient given bacteremia.   Yena Tisby [facility] Pager [phone] Cell: [phone]   If 7PM-7AM, please contact night-coverage [email or web address] Password [email or web address] 05/29/2017, 5:15 PM

## 2017-05-29 NOTE — ED Notes (Signed)
Attempted report x1. 

## 2017-05-29 NOTE — ED Notes (Signed)
IV team at bedside 

## 2017-05-29 NOTE — ED Notes (Signed)
Bariatric bed to be ordered for pt

## 2017-05-29 NOTE — Progress Notes (Signed)
Pharmacy Antibiotic Note  Shane Hamilton is a 51 y.o. male admitted on 05/29/2017 with bacteremia.  Pharmacy has been consulted for vancomycin dosing.  Patient is ESRD on HD, normally TTSat. Was discharged on Wednesday 8/22 with plans to treat CoNC bacteremia for 2 weeks- last day of therapy 06/08/17. Patient was unable to get HD today d/t HD cath issues.  Plan: Vancomycin 1g IV x1 tonight after HD Follow HD schedule for future doses Follow any changes to above plan for vancomycin     Temp (24hrs), Avg:98.4 F (36.9 C), Min:98.4 F (36.9 C), Max:98.4 F (36.9 C)   Recent Labs Lab 05/25/17 1348 05/25/17 1851 05/25/17 2212 05/26/17 0522 05/29/17 1141  WBC 8.9  --   --  10.0 9.4  CREATININE 15.29*  --   --  13.18* 14.73*  LATICACIDVEN  --  0.8 1.2  --   --     Estimated Creatinine Clearance: 9.9 mL/min (A) (by C-G formula based on SCr of 14.73 mg/dL (H)).    Allergies  Allergen Reactions  . Aspirin Other (See Comments)    GI upset      Thank you for allowing pharmacy to be a part of this patient's care.  [name] 05/29/2017 8:46 PM

## 2017-05-29 NOTE — ED Notes (Signed)
Pt states last dialysis was thursday

## 2017-05-29 NOTE — ED Notes (Signed)
Pt called out saying he wants to leave because he cannot order what he wants outside of the heart healthy diet that was ordered for him. Rn stated he is more than welcome to order outside of the hospital and someone can bring it in to him but pt stated he would rather leave and go himself. Admitting MD paged.

## 2017-05-29 NOTE — ED Notes (Signed)
Pt transferred to a bariatric bed for comfort and dinner tray ordered

## 2017-05-29 NOTE — ED Notes (Signed)
Iv team unable to find IV access, another iv team rn will try

## 2017-05-29 NOTE — Progress Notes (Signed)
Shane Hamilton has refused his cardiac monitoring and pulse oximetry.  The need and rational for the monitoring was explained to the patient and he refused with full understanding.  He states that it gets in the way and isn't needed for his "clogged catheter".  He also demands tha both lower side rails be lowered.  He sleeps very close to the side and I expressed concern for the possibility that he may roll off the bed.  He became agitated and  told me that it would never happen and that I needed to put it down.  He assured me that he would call before getting out of bed.  He is alert, oriented and has a full understanding of safety issues.

## 2017-05-29 NOTE — ED Notes (Signed)
Pt called out stating he is ready to go, he is tired of waiting. RN informed pt that we are waiting for the hospitalist to come talk to the pt about a plan for admission. EDP aware pt ready to go home. Will follow up and talk to the pt.

## 2017-05-30 DIAGNOSIS — N186 End stage renal disease: Secondary | ICD-10-CM

## 2017-05-30 DIAGNOSIS — T82898A Other specified complication of vascular prosthetic devices, implants and grafts, initial encounter: Secondary | ICD-10-CM

## 2017-05-30 DIAGNOSIS — Z992 Dependence on renal dialysis: Secondary | ICD-10-CM

## 2017-05-30 LAB — PROTIME-INR
INR: 1.61
PROTHROMBIN TIME: 19.3 s — AB (ref 11.4–15.2)

## 2017-05-30 LAB — CBC
HCT: 21.2 % — ABNORMAL LOW (ref 39.0–52.0)
HEMATOCRIT: 27.9 % — AB (ref 39.0–52.0)
HEMOGLOBIN: 6.5 g/dL — AB (ref 13.0–17.0)
HEMOGLOBIN: 8.5 g/dL — AB (ref 13.0–17.0)
MCH: 29 pg (ref 26.0–34.0)
MCH: 28.8 pg (ref 26.0–34.0)
MCHC: 30.7 g/dL (ref 30.0–36.0)
MCHC: 30.5 g/dL (ref 30.0–36.0)
MCV: 94.6 fL (ref 78.0–100.0)
MCV: 94.6 fL (ref 78.0–100.0)
PLATELETS: 389 10*3/uL (ref 150–400)
Platelets: 311 10*3/uL (ref 150–400)
RBC: 2.24 MIL/uL — ABNORMAL LOW (ref 4.22–5.81)
RBC: 2.95 MIL/uL — AB (ref 4.22–5.81)
RDW: 19.5 % — AB (ref 11.5–15.5)
RDW: 19.4 % — ABNORMAL HIGH (ref 11.5–15.5)
WBC: 8.5 10*3/uL (ref 4.0–10.5)
WBC: 9.7 10*3/uL (ref 4.0–10.5)

## 2017-05-30 LAB — RENAL FUNCTION PANEL
ALBUMIN: 2.6 g/dL — AB (ref 3.5–5.0)
Anion gap: 16 — ABNORMAL HIGH (ref 5–15)
BUN: 92 mg/dL — AB (ref 6–20)
CALCIUM: 7.5 mg/dL — AB (ref 8.9–10.3)
CO2: 24 mmol/L (ref 22–32)
CREATININE: 15.84 mg/dL — AB (ref 0.61–1.24)
Chloride: 97 mmol/L — ABNORMAL LOW (ref 101–111)
GFR calc Af Amer: 4 mL/min — ABNORMAL LOW (ref >60)
GFR calc non Af Amer: 3 mL/min — ABNORMAL LOW (ref >60)
GLUCOSE: 199 mg/dL — AB (ref 65–99)
Phosphorus: 7.2 mg/dL — ABNORMAL HIGH (ref 2.5–4.6)
Potassium: 4.2 mmol/L (ref 3.5–5.1)
SODIUM: 137 mmol/L (ref 135–145)

## 2017-05-30 LAB — GLUCOSE, CAPILLARY
GLUCOSE-CAPILLARY: 56 mg/dL — AB (ref 65–99)
GLUCOSE-CAPILLARY: 109 mg/dL — AB (ref 65–99)
Glucose-Capillary: 152 mg/dL — ABNORMAL HIGH (ref 65–99)
Glucose-Capillary: 150 mg/dL — ABNORMAL HIGH (ref 65–99)
Glucose-Capillary: 149 mg/dL — ABNORMAL HIGH (ref 65–99)

## 2017-05-30 LAB — PREPARE RBC (CROSSMATCH)

## 2017-05-30 MED ORDER — WARFARIN SODIUM 7.5 MG PO TABS
7.5000 mg | ORAL_TABLET | Freq: Once | ORAL | Status: AC
  Administered 2017-05-30: 7.5 mg via ORAL
  Filled 2017-05-30: qty 1

## 2017-05-30 MED ORDER — LIDOCAINE-PRILOCAINE 2.5-2.5 % EX CREA: 1.0000 "application " | TOPICAL_CREAM | CUTANEOUS | Status: DC | PRN

## 2017-05-30 MED ORDER — DEXTROSE 50 % IV SOLN
INTRAVENOUS | Status: AC
  Administered 2017-05-30: 50 mL
  Filled 2017-05-30: qty 50

## 2017-05-30 MED ORDER — DARBEPOETIN ALFA 200 MCG/0.4ML IJ SOSY: 200.0000 ug | PREFILLED_SYRINGE | INTRAMUSCULAR | Status: DC

## 2017-05-30 MED ORDER — DOXERCALCIFEROL 4 MCG/2ML IV SOLN
3.0000 ug | INTRAVENOUS | Status: DC
  Administered 2017-06-01: 3 ug via INTRAVENOUS
  Filled 2017-05-30: qty 2

## 2017-05-30 MED ORDER — SODIUM CHLORIDE 0.9 % IV SOLN: 100.0000 mL | INTRAVENOUS | Status: DC | PRN

## 2017-05-30 MED ORDER — HEPARIN SODIUM (PORCINE) 1000 UNIT/ML DIALYSIS
13000.0000 [IU] | Freq: Once | INTRAMUSCULAR | Status: AC
  Administered 2017-05-30: 13000 [IU] via INTRAVENOUS_CENTRAL
  Filled 2017-05-30: qty 13

## 2017-05-30 MED ORDER — SODIUM CHLORIDE 0.9 % IV SOLN: Freq: Once | INTRAVENOUS | Status: DC

## 2017-05-30 MED ORDER — VANCOMYCIN HCL IN DEXTROSE 1-5 GM/200ML-% IV SOLN
1000.0000 mg | Freq: Once | INTRAVENOUS | Status: AC
  Administered 2017-05-30: 1000 mg via INTRAVENOUS
  Filled 2017-05-30: qty 200

## 2017-05-30 MED ORDER — LIDOCAINE HCL (PF) 1 % IJ SOLN: 5.0000 mL | INTRAMUSCULAR | Status: DC | PRN

## 2017-05-30 MED ORDER — RENA-VITE PO TABS
1.0000 | ORAL_TABLET | Freq: Every day | ORAL | Status: DC
  Administered 2017-05-30 – 2017-05-31 (×2): 1 via ORAL
  Filled 2017-05-30 (×3): qty 1

## 2017-05-30 MED ORDER — ALTEPLASE 2 MG IJ SOLR: 2.0000 mg | Freq: Once | INTRAMUSCULAR | Status: DC | PRN

## 2017-05-30 MED ORDER — HEPARIN SODIUM (PORCINE) 1000 UNIT/ML DIALYSIS
1000.0000 [IU] | INTRAMUSCULAR | Status: DC | PRN
  Administered 2017-05-30: 1000 [IU] via INTRAVENOUS_CENTRAL

## 2017-05-30 MED ORDER — HEPARIN SODIUM (PORCINE) 1000 UNIT/ML IJ SOLN
8000.0000 [IU] | Freq: Once | INTRAMUSCULAR | Status: AC
  Administered 2017-05-30: 8000 [IU] via INTRAVENOUS
  Filled 2017-05-30: qty 8

## 2017-05-30 MED ORDER — PENTAFLUOROPROP-TETRAFLUOROETH EX AERO: 1.0000 "application " | INHALATION_SPRAY | CUTANEOUS | Status: DC | PRN

## 2017-05-30 NOTE — Progress Notes (Signed)
HD tx ended 1 hr 90mn early d/t poor/non functioning catheter, could only reach highest BFR of 265 from the beginning and had to decrease it throughout tx, at [phone] cath pressures were climbing and BFR down to 200 I paused tx and tried to flush bilat cath ports, at this point both ports would barely pull and sluggishly push. Since pt hgb was 6.5 and he had received a unit of PRBC on HD tx, I didn't want to chance loosing blood in the circuit b/c it clotted in the system b/c the BFR was down to 200 and still alarming. Blood rinsed back, UF goal not met, VSS w/ high bp, report called to LEarmon Phoenix RN

## 2017-05-30 NOTE — Progress Notes (Signed)
Patient refuses CPAP for the night. RT instructed patient to call if he changes his mind. RT will continue to monitor.

## 2017-05-30 NOTE — Progress Notes (Signed)
Shane Hamilton requests that his eye drops from home be restarted.

## 2017-05-30 NOTE — Progress Notes (Signed)
HD tx initiated via HD cath w/ cath function problems, after going to assess cath function and remove TPA dwells from HD cath in pt's room before deciding to bring pt up to unit to dialyze, the arterial port pulled back very sluggishly but did pull, once pt arrived to unit and I attempted to access cath for tx, the arterial port no longer pulled at all, pt repositioned in attempts to get cath to work properly but was unsuccessful. Venous port pull/push/flush ok, lines reversed, VSS but O2sat was low on pre assess and he let me place Gratz at 4L but states he won't wear it long. Once tx started I was not able to reach prescribed BFR and could only reach 265. Dr. Arlean Hopping called and made aware and was ok w/ that. Will cont to monitor while on HD tx

## 2017-05-30 NOTE — Progress Notes (Signed)
ANTICOAGULATION CONSULT NOTE  Pharmacy Consult for warfarin Indication: history of PE  Allergies  Allergen Reactions  . Aspirin Other (See Comments)    GI upset     Patient Measurements: Height: 5\' 11"  (180.3 cm) Weight: (!) 390 lb 7 oz (177.1 kg) (question accuracy of bedscale) IBW/kg (Calculated) : 75.3   Vital Signs: Temp: 97.9 F (36.6 C) (08/26 0923) Temp Source: Oral (08/26 0923) BP: 144/100 (08/26 0923) Pulse Rate: 109 (08/26 0923)  Labs:  Recent Labs  05/29/17 1141 05/30/17 0230 05/30/17 1048  HGB 7.1* 6.5* 8.5*  HCT 23.1* 21.2* 27.9*  PLT 355 311 389  LABPROT  --   --  19.3*  INR  --   --  1.61  CREATININE 14.73* 15.84*  --     Estimated Creatinine Clearance: 9.2 mL/min (A) (by C-G formula based on SCr of 15.84 mg/dL (H)).  Assessment: 33 YOM with history of PE on warfarin- home dose 5mg  daily with last dose on 8/24. INR 1.61. Hgb 8.5 after receiving 1 unit PRBC this morning, plts 389. No bleeding noted.  Goal of Therapy:  INR 2-3 Monitor platelets by anticoagulation protocol: Yes   Plan:  Warfarin 7.5mg  po x1 tonight Daily INR Follow for s/s bleeding  Wilma Wuthrich D. Jamey Demchak, PharmD, BCPS Clinical Pharmacist Pager: [phone] Clinical Phone for 05/30/2017 until 3:30pm: [phone] If after 3:30pm, please call main pharmacy at [phone] 05/30/2017 12:01 PM

## 2017-05-30 NOTE — Consult Note (Addendum)
Established Dialysis Access   History of Present Illness   Shane Hamilton is a 51 y.o. (1966-01-08) male with difficult access situation who presents cc: occluded L thigh AVG and poorly functioning R femoral TDC.  This patient essentially has an occluded R central venous system with a R innominate vein stenosis/occlusion.  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  He then underwent a L thigh AVG which was has clotted twice already having undergone percutaneous and surgical thrombectomy, most recently 03/05/17.  During that procedure, Dr. Edilia Bo noted that the graft had not incorporated well.  The patient present to the hospital with his L thigh AVG clotted again.  Renal notes poor flow rates in the R femoral vein TDC which has been in place for since June 2017 by record review.  Past Medical History:  Diagnosis Date  . Abscess of right arm 06/17/2015  . Anemia   . Anxiety   . Arteriovenous fistula infection (HCC)   . Cardiomegaly   . Chronic anticoagulation   . Chronic pancreatitis (HCC)   . DVT (deep venous thrombosis) (HCC) 2011  . ESRD (end stage renal disease) (HCC)    [facility] [place]  . GERD (gastroesophageal reflux disease)   . Gout   . Headache    migraines  . History of kidney stones   . Hypertension   . Kidney stones   . LOC (loss of consciousness) (HCC)   . Morbid obesity (HCC)   . OSA (obstructive sleep apnea)    wears CPAP  . Pneumonia   . Secondary hyperparathyroidism (HCC)   . Septic shock(785.52)   . Shortness of breath dyspnea    with exertion  . Type 2 diabetes mellitus, uncontrolled (HCC)    Type 2    Past Surgical History:  Procedure Laterality Date  . AMPUTATION FINGER / THUMB Right   . ANGIOPLASTY ILLIAC ARTERY Right 05/15/2016   Procedure: FIBRIN SHEATH ANGIOPLASTY;  Surgeon: Larina Earthly, MD;  Location: [facility] OR;   Service: Vascular;  Laterality: Right;  . APPLICATION OF WOUND VAC Left 06/20/2015   Procedure: APPLICATION OF WOUND VAC;  Surgeon: Renford Dills, MD;  Location: [facility] ORS;  Service: Vascular;  Laterality: Left;  . APPLICATION OF WOUND VAC Left 01/15/2017   AV GRAFT  . AV FISTULA PLACEMENT    . AV FISTULA PLACEMENT Left 06/20/2015   Procedure: ARTERIOVENOUS (AV) FISTULA  ligation , excision;  Surgeon: Renford Dills, MD;  Location: [facility] ORS;  Service: Vascular;  Laterality: Left;  . AV FISTULA PLACEMENT Left 12/04/2016   Procedure: EXPLORATION LEFT UPPER ARM;  Surgeon: Chuck Hint, MD;  Location: [facility] OR;  Service: Vascular;  Laterality: Left;  . AV FISTULA PLACEMENT Left 01/15/2017   Procedure: INSERTION OF ARTERIOVENOUS (AV) GORE-TEX STRETCH GRAFT THIGH;  Surgeon: Chuck Hint, MD;  Location: [facility] OR;  Service: Vascular;  Laterality: Left;  . EXCHANGE OF A DIALYSIS CATHETER Left 11/13/2015   Procedure: EXCHANGE OF A DIALYSIS CATHETER- LEFT INTERNAL JUGULAR;  Surgeon: Nada Libman, MD;  Location: [facility] OR;  Service: Vascular;  Laterality: Left;  . EXCHANGE OF A DIALYSIS CATHETER Right 05/15/2016   Procedure: EXCHANGE OF A RIGHT FEMORAL DIALYSIS CATHETER;  Surgeon: Larina Earthly, MD;  Location: [facility] OR;  Service: Vascular;  Laterality: Right;  . I&D EXTREMITY Left 06/20/2015   Procedure: IRRIGATION  AND DEBRIDEMENT EXTREMITY;  Surgeon: Renford Dills, MD;  Location: [facility] ORS;  Service: Vascular;  Laterality: Left;  . INSERTION OF DIALYSIS CATHETER N/A 06/25/2015   Procedure: place a tunnelled catheter for long term use;  Surgeon: Annice Needy, MD;  Location: [facility] ORS;  Service: Vascular;  Laterality: N/A;  . INSERTION OF DIALYSIS CATHETER Left 07/25/2015   Procedure: INSERTION OF LEFT INTERNAL JUGULAR DIALYSIS CATHETER;  Surgeon: Pryor Ochoa, MD;  Location: [facility] OR;  Service: Vascular;  Laterality: Left;  . INSERTION OF DIALYSIS CATHETER Left 08/01/2015   Procedure: REMOVAL OF  DIALYSIS CATHETER; PLACEMNET OF NEW DIALYSIS CATHETER;  Surgeon: Pryor Ochoa, MD;  Location: [facility] OR;  Service: Vascular;  Laterality: Left;  . INSERTION OF DIALYSIS CATHETER Right 03/31/2016   Procedure: INSERTION OF DIALYSIS CATHETER RIGHT FEMORAL ;  Surgeon: Chuck Hint, MD;  Location: [facility] OR;  Service: Vascular;  Laterality: Right;  . IR FLUORO GUIDE CV LINE RIGHT  01/20/2017  . IR FLUORO GUIDE CV LINE RIGHT  01/27/2017  . IR FLUORO GUIDE CV LINE RIGHT  02/03/2017  . IR GENERIC HISTORICAL  05/28/2016   IR FLUORO GUIDE CV LINE RIGHT 05/28/2016 Berdine Dance, MD [facility]-INTERV RAD  . IR GENERIC HISTORICAL  06/11/2016   IR FLUORO GUIDE CV LINE RIGHT 06/11/2016 [facility]  . IR THROMBECTOMY AV FISTULA W/THROMBOLYSIS/PTA INC/SHUNT/IMG LEFT Left 02/22/2017  . IR US GUIDE VASC ACCESS LEFT  02/22/2017  . IR VENOCAVAGRAM IVC  02/03/2017  . PERIPHERAL VASCULAR CATHETERIZATION N/A 06/25/2015   Procedure: Dialysis/Perma Catheter Insertion;  Surgeon: Annice Needy, MD;  Location: [facility] INVASIVE CV LAB;  Service: Cardiovascular;  Laterality: N/A;  . PERIPHERAL VASCULAR CATHETERIZATION Bilateral 09/13/2015   Procedure: Upper Extremity Venography;  Surgeon: Sherren Kerns, MD;  Location: [facility] INVASIVE CV LAB;  Service: Cardiovascular;  Laterality: Bilateral;  . THROMBECTOMY AND REVISION OF ARTERIOVENTOUS (AV) GORETEX  GRAFT Left 03/05/2017   Procedure: THROMBECTOMY AND REVISION OF ARTERIOVENTOUS (AV) GORETEX  GRAFT LEFT THIGH;  Surgeon: Chuck Hint, MD;  Location: [facility] OR;  Service: Vascular;  Laterality: Left;    Social History   Social History  . Marital status: Single    Spouse name: N/A  . Number of children: N/A  . Years of education: N/A   Occupational History  . disabled     Social History Main Topics  . Smoking status: Never Smoker  . Smokeless tobacco: Never Used  . Alcohol use No  . Drug use: No  . Sexual activity: Not on file   Other Topics Concern  . Not on file   Social History  Narrative   Lives with a friend in a one story home.  Has one child.  On disability.  Education: some college.      Family History  Problem Relation Age of Onset  . Diabetes Mother   . Hypertension Mother   . Diabetes Father   . Hypertension Father   . Hypertension Brother     Current Facility-Administered Medications  Medication Dose Route Frequency Provider Last Rate Last Dose  . 0.9 %  sodium chloride infusion  100 mL Intravenous PRN Delano Metz, MD      . 0.9 %  sodium chloride infusion  100 mL Intravenous PRN Delano Metz, MD      . 0.9 %  sodium chloride infusion   Intravenous Once Opyd, Lavone Neri, MD      . 0.9 %  sodium chloride infusion   Intravenous  Once Rama, Maryruth Bun, MD      . allopurinol (ZYLOPRIM) tablet 600 mg  600 mg Oral Daily Rama, Maryruth Bun, MD   600 mg at 05/30/17 1044  . alteplase (CATHFLO ACTIVASE) injection 2 mg  2 mg Intracatheter Once PRN Delano Metz, MD      . calcium acetate (PHOSLO) capsule 2,001 mg  2,001 mg Oral TID WC Rama, Maryruth Bun, MD   2,001 mg at 05/30/17 1314  . cinacalcet (SENSIPAR) tablet 60 mg  60 mg Oral Q breakfast Rama, Maryruth Bun, MD   60 mg at 05/30/17 0827  . clonazePAM (KLONOPIN) tablet 2 mg  2 mg Oral QPM Rama, Maryruth Bun, MD   2 mg at 05/29/17 2313  . colchicine tablet 1.2 mg  1.2 mg Oral Daily Rama, Maryruth Bun, MD   1.2 mg at 05/30/17 1043  . [START ON 06/03/2017] Darbepoetin Alfa (ARANESP) injection 200 mcg  200 mcg Intravenous Q Thu-HD Weston Settle, PA-C      . [START ON 06/01/2017] doxercalciferol (HECTOROL) injection 3 mcg  3 mcg Intravenous Q T,Th,Sa-HD Weston Settle, PA-C      . gabapentin (NEURONTIN) capsule 300 mg  300 mg Oral TID PRN Rama, Maryruth Bun, MD      . heparin injection 1,000 Units  1,000 Units Dialysis PRN Delano Metz, MD   1,000 Units at 05/30/17 [id number]  . HYDROcodone-acetaminophen (NORCO/VICODIN) 5-325 MG per tablet 1-2 tablet  1-2 tablet Oral Q12H PRN Rama, Maryruth Bun, MD      . insulin  aspart (novoLOG) injection 35 Units  35 Units Subcutaneous TID WC Rama, Maryruth Bun, MD   35 Units at 05/30/17 1045  . insulin detemir (LEVEMIR) injection 70 Units  70 Units Subcutaneous Q2200 Rama, Maryruth Bun, MD   70 Units at 05/29/17 2314  . lanthanum (FOSRENOL) chewable tablet 1,000 mg  1,000 mg Oral TID WC Rama, Maryruth Bun, MD   1,000 mg at 05/30/17 1314  . lidocaine (PF) (XYLOCAINE) 1 % injection 5 mL  5 mL Intradermal PRN Delano Metz, MD      . lidocaine-prilocaine (EMLA) cream 1 application  1 application Topical PRN Delano Metz, MD      . multivitamin (RENA-VIT) tablet 1 tablet  1 tablet Oral QHS Weston Settle, PA-C      . nortriptyline (PAMELOR) capsule 10 mg  10 mg Oral QHS Rama, Maryruth Bun, MD   10 mg at 05/29/17 2313  . ondansetron (ZOFRAN) tablet 4 mg  4 mg Oral Q6H PRN Rama, Maryruth Bun, MD       Or  . ondansetron (ZOFRAN) injection 4 mg  4 mg Intravenous Q6H PRN Rama, Christina P, MD      . pantoprazole (PROTONIX) EC tablet 40 mg  40 mg Oral Daily Rama, Maryruth Bun, MD   40 mg at 05/30/17 1043  . pentafluoroprop-tetrafluoroeth (GEBAUERS) aerosol 1 application  1 application Topical PRN Delano Metz, MD      . polyethylene glycol (MIRALAX / GLYCOLAX) packet 17 g  17 g Oral Daily PRN Rama, Christina P, MD      . sodium chloride flush (NS) 0.9 % injection 3 mL  3 mL Intravenous Q12H Rama, Maryruth Bun, MD   3 mL at 05/30/17 1044  . vancomycin (VANCOCIN) IVPB 1000 mg/200 mL premix  1,000 mg Intravenous Once Bajbus, Lauren D, RPH 200 mL/hr at 05/30/17 1316 1,000 mg at 05/30/17 1316  . warfarin (COUMADIN) tablet 7.5 mg  7.5 mg Oral ONCE-1800 Bajbus, Lauren D,  RPH      . Warfarin - Pharmacist Dosing Inpatient   Does not apply q1800 Bajbus, Lauren D, RPH         Allergies  Allergen Reactions  . Aspirin Other (See Comments)    GI upset     REVIEW OF SYSTEMS (negative unless checked):   Cardiac:  []  Chest pain or chest pressure? []  Shortness of breath upon  activity? []  Shortness of breath when lying flat? []  Irregular heart rhythm?  Vascular:  []  Pain in calf, thigh, or hip brought on by walking? []  Pain in feet at night that wakes you up from your sleep? []  Blood clot in your veins? []  Leg swelling?  Pulmonary:  []  Oxygen at home? []  Productive cough? []  Wheezing?  Neurologic:  [x]  Sudden weakness in arms or legs? []  Sudden numbness in arms or legs? []  Sudden onset of difficult speaking or slurred speech? []  Temporary loss of vision in one eye? []  Problems with dizziness?  Gastrointestinal:  []  Blood in stool? []  Vomited blood?  Genitourinary:  []  Burning when urinating? []  Blood in urine?  Psychiatric:  []  Major depression  Hematologic:  []  Bleeding problems? []  Problems with blood clotting?  Dermatologic:  []  Rashes or ulcers?  Constitutional:  []  Fever or chills?  Ear/Nose/Throat:  []  Change in hearing? []  Nose bleeds? []  Sore throat?  Musculoskeletal:  []  Back pain? []  Joint pain? [x]  Muscle pain?   Physical Examination   Vitals:   05/30/17 0600 05/30/17 0615 05/30/17 0630 05/30/17 0923  BP: (!) 171/95 (!) 183/103 (!) 182/102 (!) 144/100  Pulse: (!) 106 (!) 117 (!) 113 (!) 109  Resp: (!) 23 (!) 23 (!) 29 (!) 26  Temp:   98.1 F (36.7 C) 97.9 F (36.6 C)  TempSrc:   Oral Oral  SpO2: 90% 100% 100%   Weight:   (!) 390 lb 7 oz (177.1 kg)   Height:       Body mass index is 54.45 kg/m.  General Alert, O x 3, morbidly obese, NAD  Pulmonary Sym exp, good B air movt, CTA B  Cardiac RRR, Nl S1, S2, no Murmurs, No rubs, No S3,S4  Vascular Vessel Right Left  Radial Faintly palpable Faintly palpable  Brachial Faintly palpable Faintly palpable  Ulnar Not palpable Not palpable    Musculo- skeletal M/S 5/5 throughout  , Extremities without ischemic changes, no thrill or bruit in L leg, R femoral TDC  Neurologic Pain and light touch intact in extremities , Motor exam as listed above    Laboratory    CBC CBC Latest Ref Rng & Units 05/30/2017 05/30/2017 05/29/2017  WBC 4.0 - 10.5 K/uL 9.7 8.5 9.4  Hemoglobin 13.0 - 17.0 g/dL 4.6(K) 6.5(LL) 7.1(L)  Hematocrit 39.0 - 52.0 % 27.9(L) 21.2(L) 23.1(L)  Platelets 150 - 400 K/uL 389 311 355    BMP BMP Latest Ref Rng & Units 05/30/2017 05/29/2017 05/26/2017  Glucose 65 - 99 mg/dL 599(J) 570(V) 779(T)  BUN 6 - 20 mg/dL 90(Z) 00(P) 23(R)  Creatinine 0.61 - 1.24 mg/dL 00.76(A) 26.33(H) 54.56(Y)  Sodium 135 - 145 mmol/L 137 137 134(L)  Potassium 3.5 - 5.1 mmol/L 4.2 3.8 4.3  Chloride 101 - 111 mmol/L 97(L) 96(L) 97(L)  CO2 22 - 32 mmol/L 24 24 23   Calcium 8.9 - 10.3 mg/dL 7.5(L) 7.8(L) 8.8(L)    Coagulation Lab Results  Component Value Date   INR 1.61 05/30/2017   INR 1.47 05/26/2017   INR 1.48 05/25/2017  No results found for: PTT  Lipids    Component Value Date/Time   TRIG 269 (H) 06/20/2015 0212   Blood cultures x 2 05/25/17   STAPHYLOCOCCUS LUGDUNENSIS       Medical Decision Making   TALTON DELPRIORE is a 51 y.o. male who presents with ESRD requiring hemodialysis, bacteremia,    I doubt the L thigh AVG is salvageable.  Also given the poor incorporation of L thigh AVG, concern for possible infection is raised, so I would not place another L thigh AVG immediately.  Might consider another one in the future once all concerns for graft infection eliminated.  I would plan on exchanging the R femoral TDC for a sheath and doing a iliac and central venogram to evaluate the patency of the iliac venous tract and IVC.  Once this is completed, a fibrin sheath angioplasty is likely necessary followed by placement of new femoral TDC.  Depending on the findings of the venogram, the patient might be a candidate for a R thigh AVG with Accuseal graft.    Obviously while active bacteremic, the patient is not a candidate for AVG placement.  In the past this patient has been canceled on 3 different occasions due to acute on chronic  respiratory failure, likely related to his morbid obesity.  His pulmonary status will subsequently likely impact any future procedures offered to him.  The patient has agreed to proceed with the above procedure which will be scheduled this Monday 05/31/17 with Dr. Randie Heinz.   Shane Sake, MD, FACS [facility] Office: [phone] Pager: [phone]

## 2017-05-30 NOTE — Progress Notes (Addendum)
Pharmacy Antibiotic Note  Shane Hamilton is a 51 y.o. male admitted on 05/29/2017 with bacteremia.  Pharmacy has been consulted for vancomycin dosing.  Patient is ESRD on HD, normally TTSat. Was discharged on Wednesday 8/22 with plans to treat CoNC bacteremia for 2 weeks- last day of therapy 06/08/17.  Patient was unable to get HD as an outpatient 8/25 d/t HD cath issues. Last HD as an outpatient was on 8/23- went for 6h and with BFR ~269mL/min and should have received vancomycin 1750mg  after that session  Patient went for HD overnight- vancomycin 1g was ordered to be given after HD session, but it was not charted as given. Patient only tolerated ~2.75h @ BFR 225 with HD upon admission.  Per rough kinetics, estimate patient's current level is ~27mcg/mL which is in range, but on lower end.  Plan: Vancomycin 1g IV x1 today on the floor- spoke with RN Shane Hamilton about giving Will check pre-HD vancomycin random level tomorrow (going to HD off schedule). Follow HD schedule/tolerance for future doses Follow for any changes to planned LOT  Height: 5\' 11"  (180.3 cm) Weight: (!) 390 lb 7 oz (177.1 kg) (question accuracy of bedscale) IBW/kg (Calculated) : 75.3  Temp (24hrs), Avg:98.3 F (36.8 C), Min:97.9 F (36.6 C), Max:98.5 F (36.9 C)   Recent Labs Lab 05/25/17 1348 05/25/17 1851 05/25/17 2212 05/26/17 0522 05/29/17 1141 05/30/17 0230  WBC 8.9  --   --  10.0 9.4 8.5  CREATININE 15.29*  --   --  13.18* 14.73* 15.84*  LATICACIDVEN  --  0.8 1.2  --   --   --     Estimated Creatinine Clearance: 9.2 mL/min (A) (by C-G formula based on SCr of 15.84 mg/dL (H)).    Allergies  Allergen Reactions  . Aspirin Other (See Comments)    GI upset    Vancomycin 8/22>>  8/21 BCx: 2/2 CoNS 8/22 BCx: ngtd 8/22 MRSA PCR: neg  Thank you for allowing pharmacy to be a part of this patient's care.  Shane Hamilton D. Shane Hamilton, PharmD, BCPS Clinical Pharmacist Pager: [phone] Clinical Phone for  05/30/2017 until 3:30pm: [phone] If after 3:30pm, please call main pharmacy at [phone] 05/30/2017 11:02 AM

## 2017-05-30 NOTE — Progress Notes (Signed)
Progress Note    Shane Hamilton  ZOX:[id number] DOB: 1966/09/24  DOA: 05/29/2017 PCP: Camille Bal, MD    Brief Narrative:   Chief complaint: F/U bacteremia  Medical records reviewed and are as summarized below:  Shane Hamilton is an 51 y.o. male with a PMH of uncontrolled diabetes, ESRD on HD TTHS, morbid obesity, OSA on CPAP, hypertension, and DVT on chronic anticoagulation who was admitted 05/25/17-05/26/17 for evaluation of generalized weakness. Of note, unable to have dialysis on the date of admission due to catheter issue, but catheter was functional during his hospital stay and he was successfully dialyzed. Blood cultures were positive for Staphylococcus lugdunensis (coagulase negative), and ID did recommend 2 weeks of vancomycin with HD. He subsequently was discharged home 05/26/17 and went to his dialysis treatment 05/29/17, but once again his catheter was nonfunctional and could not be dialyzed and therefore he was re-admitted.  Assessment/Plan:   Principal Problem:   Staphylococcus lugdunensis bacteremia ID initially recommended 2 weeks of vancomycin with HD treatments. Continue vancomycin. Monitor for toxicity. We'll touch base with ID to see if they would like to change therapy now that speciation is back.  Active Problems:   Hypertension Blood pressure suboptimally controlled. Not on antihypertensives at baseline, so this is likely a volume issue.    Obstructive sleep apnea Continue CPAP at bedtime.    DVT (deep venous thrombosis) (HCC) Coumadin per pharmacy.    Type 2 diabetes mellitus, uncontrolled (HCC) Continue home regimen of Levemir 70 units with NovoLog meal coverage. CBG 85.    Morbid obesity (HCC) BMI is 55.04 kg/m.    Gout Continue colchicine and allopurinol.    End-stage renal disease on hemodialysis (HCC)/metabolic bone disease/secondary hyperparathyroidism Nephrology managing. TDC catheter to be replaced 05/31/17.    Diabetic  polyneuropathy associated with diabetes mellitus due to underlying condition (HCC) Continue Neurontin.    Anemia due to end stage renal disease (HCC) Monitor hemoglobin. Last Mircera on 05/20/17. Hemoglobin 6.5 mg/dL and received 1 unit of PRBCs 05/30/17. Fecal occult blood testing ordered.  Family Communication/Anticipated D/C date and plan/Code Status   DVT prophylaxis: Coumadin ordered. Code Status: Full Code.  Family Communication: No family at the bedside. Disposition Plan: Home after HD access definitively addressed.   Medical Consultants:    Nephrology  Vascular Surgery   Anti-Infectives:    Vancomycin  Subjective:   Shane Hamilton is frustrated today. Denies fever/chills but does have occasional shortness of breath. Dry cough. No nausea or vomiting. Appetite fair.  Objective:    Vitals:   05/30/17 0530 05/30/17 0600 05/30/17 0615 05/30/17 0630  BP: (!) 196/107 (!) 171/95 (!) 183/103 (!) 182/102  Pulse: (!) 110 (!) 106 (!) 117 (!) 113  Resp: (!) 27 (!) 23 (!) 23 (!) 29  Temp:    98.1 F (36.7 C)  TempSrc: Oral   Oral  SpO2: 90% 90% 100% 100%  Weight:    (!) 177.1 kg (390 lb 7 oz)  Height:        Intake/Output Summary (Last 24 hours) at 05/30/17 0740 Last data filed at 05/30/17 0615  Gross per 24 hour  Intake              605 ml  Output             2364 ml  Net            -1759 ml   Filed Weights   05/29/17 1900 05/30/17 0314 05/30/17 0630  Weight: (!) 179.2 kg (395 lb 1 oz) (!) 179.3 kg (395 lb 4.6 oz) (!) 177.1 kg (390 lb 7 oz)    Exam: General: No acute distress. Morbidly obese. Cardiovascular: Heart sounds show a tachycardic rate, and regular rhythm. No gallops or rubs. No murmurs. No JVD. Lungs: Decreased breath sounds throughout. No rales, rhonchi or wheezes. Abdomen: Soft, nontender, nondistended with normal active bowel sounds. No masses. No hepatosplenomegaly. Neurological: Alert and oriented 3. Moves all extremities 4 with equal strength.  Cranial nerves II through XII grossly intact. Skin: Warm and dry. No rashes or lesions. Extremities: No clubbing or cyanosis. No edema. Pedal pulses 2+. Left medial thigh graft present, right femoral TDC without signs of infection. Psychiatric: Mood and affect are normal. Insight and judgment are mildly impaired.   Data Reviewed:   I have personally reviewed following labs and imaging studies:  Labs: Labs show the following: Sodium 137, potassium 4.2, chloride 97, bicarbonate 24, BUN 92, creatinine 15.84, glucose 199. Phosphorus 7.2, albumin 2.6. WBC 8.5, hemoglobin 6.5, hematocrit 21.2, platelets 311.  Microbiology  05/26/17: Repeat blood cultures: Negative to date.  Procedures and diagnostic studies:  No results found.  Medications:   . allopurinol  600 mg Oral Daily  . calcium acetate  2,001 mg Oral TID WC  . cinacalcet  60 mg Oral Q breakfast  . clonazePAM  2 mg Oral QPM  . colchicine  1.2 mg Oral Daily  . insulin aspart  35 Units Subcutaneous TID WC  . insulin detemir  70 Units Subcutaneous Q2200  . lanthanum  1,000 mg Oral TID WC  . nortriptyline  10 mg Oral QHS  . pantoprazole  40 mg Oral Daily  . sodium chloride flush  3 mL Intravenous Q12H  . Warfarin - Pharmacist Dosing Inpatient   Does not apply q1800   Continuous Infusions: . sodium chloride    . sodium chloride    . sodium chloride    . vancomycin       LOS: 1 day   RAMA,CHRISTINA  [facility] Pager [phone]. If unable to reach me by pager, please call my cell phone at [phone].  *Please refer to [email or web address], password [email or web address] to get updated schedule on who will round on this patient, as hospitalists switch teams weekly. If 7PM-7AM, please contact night-coverage at [email or web address], password [email or web address] for any overnight needs.  05/30/2017, 7:40 AM

## 2017-05-30 NOTE — Progress Notes (Signed)
[facility] Progress Note   heparin  Dialysis Orders:  NW TTS 6 hr - 400/800 EDW 181 2K 2.25 Ca heparin 15 K with 10 K mid treatment Mircera 225 q 2 weeks - last given 8/16 Hectorol 3  Assessment/Plan: 1. Clotted left thigh graft - placed 4/18 - declotted by IR 5/18 and thrombectomy and rev 6/18 by Dr. Edilia Bo - noted to be reclotted 8/21; was to be referred for declot - unclear to me if this is deemed to be a failed access. VVS to decide. 2. Poorly functionally TDC - limped through treatment last night- TDC to be replaced Monday per VVS 3. Anemia - hgb 6.5 this am - received 1 unit PRBC after blood draw; follow transfuse more as needed; refused to do outpatient stool cards; last tsat 12% getting short courses of Fe - hard to know if Fe deficit due to not getting enough Fe due to ^ ferritin or GI blood loss - on both - he is on coumadin - have ordered FOBT 4. ESRD - K 4.2 BUN 92 Cr 15 due to under dialysis from poorly functional catheter 5. Secondary hyperparathyroidism - binders/ hectorol 6. HTN/volume - yesterday at dialysis presented 1 kg BELOW edw at 180 with LE edema BP 150/100- need to titrate EDW down!- net UF 2.3 down to 177.1 7. Nutrition - renal diet/vits - liberalized to regular with fluid restriction per patient requested - admonished to make prudent selections. 8. 2/2 + Staph lugdunensis bacteremia 8/21- catheter was not removed, planned to try and treat through. Was sens to Vanc; BC redrawn 8/22 no growth pending- needs Vanc level due to inconsistent kinetics -check Monday with HD.  Cath to be removed for poor function tomorrow.  9. Morbid obesity 10. 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 DM - per primary    Sheffield Slider, PA-C [facility] Beeper [phone] 05/30/2017,8:11 AM  LOS: 1 day   Pt seen, examined, agree w assess/plan  as above with additions as indicated.  Vinson Moselle MD [employer] pager [phone]    cell [phone] 05/30/2017, 11:35 AM     Subjective:   Unhappy about diet and being here - thinks he could be if home if they are not going to do anything.  Agrees to make good choices on a regular diet.  Everything he eats goes through him.. Denies dark stools or visible blood.  Objective Vitals:   05/30/17 0530 05/30/17 0600 05/30/17 0615 05/30/17 0630  BP: (!) 196/107 (!) 171/95 (!) 183/103 (!) 182/102  Pulse: (!) 110 (!) 106 (!) 117 (!) 113  Resp: (!) 27 (!) 23 (!) 23 (!) 29  Temp:    98.1 F (36.7 C)  TempSrc: Oral   Oral  SpO2: 90% 90% 100% 100%  Weight:    (!) 177.1 kg (390 lb 7 oz)  Height:       Physical Exam General: morbid obese - NAD -sitting on side of bed Heart: tachy reg Lungs: no rales - somewhat limited due to body habitus Abdomen: obese Extremities: + brawny LE edema Dialysis Access:  Clotted left medial thigh graft and right fem TDC   Additional Objective Labs: Basic Metabolic Panel:  Recent Labs Lab 05/26/17 0522 05/29/17 1141 05/30/17 0230  NA 134* 137 137  K 4.3 3.8 4.2  CL 97* 96* 97*  CO2 23 24 24  GLUCOSE 154* 100* 199*  BUN 75* 84* 92*  CREATININE 13.18* 14.73* 15.84*  CALCIUM 8.8* 7.8* 7.5*  PHOS 3.6  --  7.2*   Liver Function Tests:  Recent Labs Lab 05/25/17 1348 05/29/17 1141 05/30/17 0230  AST 26 21  --   ALT 24 22  --   ALKPHOS 131* 120  --   BILITOT 0.6 0.7  --   PROT 8.2* 8.2*  --   ALBUMIN 3.0* 2.9* 2.6*   No results for input(s): LIPASE, AMYLASE in the last 168 hours. CBC:  Recent Labs Lab 05/25/17 1348 05/26/17 0522 05/29/17 1141 05/30/17 0230  WBC 8.9 10.0 9.4 8.5  NEUTROABS  --   --  6.6  --   HGB 7.6* 7.3* 7.1* 6.5*  HCT 24.7* 24.3* 23.1* 21.2*  MCV 95.0 95.7 94.7 94.6  PLT 341 318 355 311   Blood Culture    Component Value Date/Time   SDES BLOOD RIGHT HAND 05/26/2017 1603   SPECREQUEST   05/26/2017 1603    BOTTLES DRAWN AEROBIC ONLY Blood Culture adequate volume   CULT NO GROWTH 3 DAYS 05/26/2017 1603   REPTSTATUS PENDING 05/26/2017 1603    Cardiac Enzymes: No results for input(s): CKTOTAL, CKMB, CKMBINDEX, TROPONINI in the last 168 hours. CBG:  Recent Labs Lab 05/26/17 0436 05/26/17 0800 05/26/17 1134 05/29/17 2122  GLUCAP 146* 158* 210* 85   Iron Studies: No results for input(s): IRON, TIBC, TRANSFERRIN, FERRITIN in the last 72 hours. Lab Results  Component Value Date   INR 1.47 05/26/2017   INR 1.48 05/25/2017   INR 1.39 03/05/2017   Studies/Results: No results found. Medications: . sodium chloride    . sodium chloride    . sodium chloride    . sodium chloride    . vancomycin     . allopurinol  600 mg Oral Daily  . calcium acetate  2,001 mg Oral TID WC  . cinacalcet  60 mg Oral Q breakfast  . clonazePAM  2 mg Oral QPM  . colchicine  1.2 mg Oral Daily  . insulin aspart  35 Units Subcutaneous TID WC  . insulin detemir  70 Units Subcutaneous Q2200  . lanthanum  1,000 mg Oral TID WC  . nortriptyline  10 mg Oral QHS  . pantoprazole  40 mg Oral Daily  . sodium chloride flush  3 mL Intravenous Q12H  . Warfarin - Pharmacist Dosing Inpatient   Does not apply [phone]

## 2017-05-31 ENCOUNTER — Encounter (HOSPITAL_COMMUNITY): Payer: Self-pay | Admitting: Orthopedic Surgery

## 2017-05-31 ENCOUNTER — Inpatient Hospital Stay (HOSPITAL_COMMUNITY): Payer: Medicare Other | Admitting: Certified Registered Nurse Anesthetist

## 2017-05-31 ENCOUNTER — Encounter (HOSPITAL_COMMUNITY)
Admission: EM | Disposition: A | Discharge status: Home / Self Care | Payer: Self-pay | Source: Home / Self Care | Attending: Internal Medicine

## 2017-05-31 HISTORY — PX: VENOGRAM: SHX5497

## 2017-05-31 HISTORY — PX: PERIPHERAL VASCULAR BALLOON ANGIOPLASTY: CATH118281

## 2017-05-31 HISTORY — PX: INSERTION OF DIALYSIS CATHETER: SHX1324

## 2017-05-31 LAB — CULTURE, BLOOD (ROUTINE X 2)
CULTURE: NO GROWTH
SPECIAL REQUESTS: ADEQUATE

## 2017-05-31 LAB — GLUCOSE, CAPILLARY
GLUCOSE-CAPILLARY: 126 mg/dL — AB (ref 65–99)
GLUCOSE-CAPILLARY: 101 mg/dL — AB (ref 65–99)
GLUCOSE-CAPILLARY: 92 mg/dL (ref 65–99)
Glucose-Capillary: 72 mg/dL (ref 65–99)
Glucose-Capillary: 76 mg/dL (ref 65–99)
Glucose-Capillary: 133 mg/dL — ABNORMAL HIGH (ref 65–99)

## 2017-05-31 LAB — SURGICAL PCR SCREEN
MRSA, PCR: NEGATIVE
Staphylococcus aureus: NEGATIVE

## 2017-05-31 LAB — RENAL FUNCTION PANEL
ALBUMIN: 2.7 g/dL — AB (ref 3.5–5.0)
Anion gap: 16 — ABNORMAL HIGH (ref 5–15)
BUN: 82 mg/dL — AB (ref 6–20)
CALCIUM: 8.2 mg/dL — AB (ref 8.9–10.3)
CO2: 22 mmol/L (ref 22–32)
CREATININE: 14.49 mg/dL — AB (ref 0.61–1.24)
Chloride: 100 mmol/L — ABNORMAL LOW (ref 101–111)
GFR calc Af Amer: 4 mL/min — ABNORMAL LOW (ref >60)
GFR calc non Af Amer: 3 mL/min — ABNORMAL LOW (ref >60)
GLUCOSE: 125 mg/dL — AB (ref 65–99)
PHOSPHORUS: 5.9 mg/dL — AB (ref 2.5–4.6)
Potassium: 4.3 mmol/L (ref 3.5–5.1)
SODIUM: 138 mmol/L (ref 135–145)

## 2017-05-31 LAB — PROTIME-INR
INR: 1.55
PROTHROMBIN TIME: 18.8 s — AB (ref 11.4–15.2)

## 2017-05-31 LAB — VANCOMYCIN, RANDOM: Vancomycin Rm: 24

## 2017-05-31 SURGERY — INSERTION OF DIALYSIS CATHETER
Anesthesia: Monitor Anesthesia Care | Site: Groin | Laterality: Right

## 2017-05-31 MED ORDER — DEXTROSE 5 % IV SOLN
INTRAVENOUS | Status: DC | PRN
  Administered 2017-05-31: 1.5 g via INTRAVENOUS

## 2017-05-31 MED ORDER — 0.9 % SODIUM CHLORIDE (POUR BTL) OPTIME
TOPICAL | Status: DC | PRN
  Administered 2017-05-31: 1000 mL

## 2017-05-31 MED ORDER — PROPOFOL 10 MG/ML IV BOLUS
INTRAVENOUS | Status: DC | PRN
Start: 2017-05-31 — End: 2017-05-31
  Administered 2017-05-31: 20 mg via INTRAVENOUS

## 2017-05-31 MED ORDER — FENTANYL CITRATE (PF) 100 MCG/2ML IJ SOLN: 25.0000 ug | INTRAMUSCULAR | Status: DC | PRN

## 2017-05-31 MED ORDER — WARFARIN SODIUM 7.5 MG PO TABS
7.5000 mg | ORAL_TABLET | Freq: Once | ORAL | Status: AC
  Administered 2017-05-31: 7.5 mg via ORAL
  Filled 2017-05-31: qty 1

## 2017-05-31 MED ORDER — PROPOFOL 500 MG/50ML IV EMUL
INTRAVENOUS | Status: DC | PRN
  Administered 2017-05-31: 50 ug/kg/min via INTRAVENOUS

## 2017-05-31 MED ORDER — INSULIN DETEMIR 100 UNIT/ML ~~LOC~~ SOLN
60.0000 [IU] | Freq: Every day | SUBCUTANEOUS | Status: DC
  Administered 2017-05-31: 60 [IU] via SUBCUTANEOUS
  Filled 2017-05-31 (×2): qty 0.6

## 2017-05-31 MED ORDER — ONDANSETRON HCL 4 MG/2ML IJ SOLN
INTRAMUSCULAR | Status: AC
Start: 2017-05-31 — End: 2017-05-31
  Filled 2017-05-31: qty 2

## 2017-05-31 MED ORDER — FENTANYL CITRATE (PF) 250 MCG/5ML IJ SOLN
INTRAMUSCULAR | Status: AC
  Filled 2017-05-31: qty 5

## 2017-05-31 MED ORDER — CEFUROXIME SODIUM 750 MG IJ SOLR
INTRAMUSCULAR | Status: AC
  Filled 2017-05-31: qty 1500

## 2017-05-31 MED ORDER — LIDOCAINE-EPINEPHRINE 1 %-1:100000 IJ SOLN
INTRAMUSCULAR | Status: AC
  Filled 2017-05-31: qty 1

## 2017-05-31 MED ORDER — PROPOFOL 10 MG/ML IV BOLUS: INTRAVENOUS | Status: DC | PRN

## 2017-05-31 MED ORDER — VANCOMYCIN HCL IN DEXTROSE 1-5 GM/200ML-% IV SOLN
1000.0000 mg | INTRAVENOUS | Status: AC
  Administered 2017-06-01: 1000 mg via INTRAVENOUS
  Filled 2017-05-31: qty 200

## 2017-05-31 MED ORDER — LIDOCAINE-EPINEPHRINE (PF) 1 %-1:200000 IJ SOLN
INTRAMUSCULAR | Status: DC | PRN
  Administered 2017-05-31: 10 mL

## 2017-05-31 MED ORDER — PHENYLEPHRINE 40 MCG/ML (10ML) SYRINGE FOR IV PUSH (FOR BLOOD PRESSURE SUPPORT)
PREFILLED_SYRINGE | INTRAVENOUS | Status: AC
  Filled 2017-05-31: qty 10

## 2017-05-31 MED ORDER — SODIUM CHLORIDE 0.9 % IV SOLN
INTRAVENOUS | Status: DC | PRN
  Administered 2017-05-31: 500 mL

## 2017-05-31 MED ORDER — IODIXANOL 320 MG/ML IV SOLN
INTRAVENOUS | Status: DC | PRN
  Administered 2017-05-31: 50 mL via INTRAMUSCULAR

## 2017-05-31 MED ORDER — HEPARIN SODIUM (PORCINE) 1000 UNIT/ML IJ SOLN
INTRAMUSCULAR | Status: DC | PRN
  Administered 2017-05-31: 6 [IU]

## 2017-05-31 MED ORDER — SODIUM CHLORIDE 0.9 % IV SOLN
INTRAVENOUS | Status: DC
  Administered 2017-05-31 (×2): via INTRAVENOUS

## 2017-05-31 MED ORDER — HEPARIN SODIUM (PORCINE) 1000 UNIT/ML IJ SOLN
INTRAMUSCULAR | Status: AC
  Filled 2017-05-31: qty 1

## 2017-05-31 SURGICAL SUPPLY — 85 items
BAG DECANTER FOR FLEXI CONT (MISCELLANEOUS) ×4 IMPLANT
BAG SNAP BAND KOVER 36X36 (MISCELLANEOUS) ×4 IMPLANT
BALLN MUSTANG 8X60X75 (BALLOONS) ×4
BALLOON MUSTANG 8X60X75 (BALLOONS) ×2 IMPLANT
BANDAGE ACE 4X5 VEL STRL LF (GAUZE/BANDAGES/DRESSINGS) IMPLANT
BIOPATCH RED 1 DISK 7.0 (GAUZE/BANDAGES/DRESSINGS) ×3 IMPLANT
BIOPATCH RED 1IN DISK 7.0MM (GAUZE/BANDAGES/DRESSINGS) ×1
BLADE SURG 11 STRL SS (BLADE) ×4 IMPLANT
CANISTER SUCT 3000ML PPV (MISCELLANEOUS) ×4 IMPLANT
CATH ANGIO 5F BER2 65CM (CATHETERS) ×4 IMPLANT
CATH OMNI FLUSH .035X70CM (CATHETERS) IMPLANT
CATH PALINDROME RT-P 15FX19CM (CATHETERS) IMPLANT
CATH PALINDROME RT-P 15FX23CM (CATHETERS) IMPLANT
CATH PALINDROME RT-P 15FX28CM (CATHETERS) IMPLANT
CATH PALINDROME RT-P 15FX55CM (CATHETERS) IMPLANT
CLIP VESOCCLUDE MED 6/CT (CLIP) IMPLANT
CLIP VESOCCLUDE SM WIDE 6/CT (CLIP) IMPLANT
COVER DOME SNAP 22 D (MISCELLANEOUS) ×4 IMPLANT
COVER PROBE W GEL 5X96 (DRAPES) ×4 IMPLANT
COVER SURGICAL LIGHT HANDLE (MISCELLANEOUS) ×4 IMPLANT
DERMABOND ADVANCED (GAUZE/BANDAGES/DRESSINGS) ×2
DERMABOND ADVANCED .7 DNX12 (GAUZE/BANDAGES/DRESSINGS) ×2 IMPLANT
DRAIN CHANNEL 15F RND FF W/TCR (WOUND CARE) IMPLANT
DRAPE C-ARM 42X72 X-RAY (DRAPES) ×4 IMPLANT
DRAPE CHEST BREAST 15X10 FENES (DRAPES) ×4 IMPLANT
DRAPE X-RAY CASS 24X20 (DRAPES) IMPLANT
ELECT REM PT RETURN 9FT ADLT (ELECTROSURGICAL)
ELECTRODE REM PT RTRN 9FT ADLT (ELECTROSURGICAL) IMPLANT
EVACUATOR SILICONE 100CC (DRAIN) IMPLANT
GAUZE SPONGE 2X2 8PLY STRL LF (GAUZE/BANDAGES/DRESSINGS) ×2 IMPLANT
GAUZE SPONGE 4X4 16PLY XRAY LF (GAUZE/BANDAGES/DRESSINGS) ×4 IMPLANT
GLOVE BIO SURGEON STRL SZ7.5 (GLOVE) ×4 IMPLANT
GOWN STRL REUS W/ TWL LRG LVL3 (GOWN DISPOSABLE) ×4 IMPLANT
GOWN STRL REUS W/ TWL XL LVL3 (GOWN DISPOSABLE) ×2 IMPLANT
GOWN STRL REUS W/TWL LRG LVL3 (GOWN DISPOSABLE) ×4
GOWN STRL REUS W/TWL XL LVL3 (GOWN DISPOSABLE) ×2
HEMOSTAT SNOW SURGICEL 2X4 (HEMOSTASIS) IMPLANT
KIT BASIN OR (CUSTOM PROCEDURE TRAY) ×4 IMPLANT
KIT ENCORE 26 ADVANTAGE (KITS) ×4 IMPLANT
KIT ROOM TURNOVER OR (KITS) ×4 IMPLANT
NEEDLE 18GX1X1/2 (RX/OR ONLY) (NEEDLE) ×4 IMPLANT
NEEDLE HYPO 25GX1X1/2 BEV (NEEDLE) ×4 IMPLANT
NEEDLE PERC 18GX7CM (NEEDLE) ×4 IMPLANT
NS IRRIG 1000ML POUR BTL (IV SOLUTION) ×4 IMPLANT
PACK CV ACCESS (CUSTOM PROCEDURE TRAY) IMPLANT
PACK GENERAL/GYN (CUSTOM PROCEDURE TRAY) ×4 IMPLANT
PACK PERIPHERAL VASCULAR (CUSTOM PROCEDURE TRAY) IMPLANT
PACK SURGICAL SETUP 50X90 (CUSTOM PROCEDURE TRAY) ×4 IMPLANT
PAD ARMBOARD 7.5X6 YLW CONV (MISCELLANEOUS) ×8 IMPLANT
PADDING CAST ABS 6INX4YD NS (CAST SUPPLIES)
PADDING CAST ABS COTTON 6X4 NS (CAST SUPPLIES) IMPLANT
SET COLLECT BLD 21X3/4 12 (NEEDLE) IMPLANT
SET MICROPUNCTURE 5F STIFF (MISCELLANEOUS) IMPLANT
SHEATH AVANTI 11CM 5FR (MISCELLANEOUS) ×4 IMPLANT
SHEATH BRITE TIP 8FR 23CM (MISCELLANEOUS) ×4 IMPLANT
SHEATH PINNACLE 9F 10CM (SHEATH) ×4 IMPLANT
SOAP 2 % CHG 4 OZ (WOUND CARE) ×4 IMPLANT
SPONGE GAUZE 2X2 STER 10/PKG (GAUZE/BANDAGES/DRESSINGS) ×2
STOPCOCK 4 WAY LG BORE MALE ST (IV SETS) IMPLANT
STOPCOCK MORSE 400PSI 3WAY (MISCELLANEOUS) ×4 IMPLANT
SUT ETHILON 3 0 PS 1 (SUTURE) ×4 IMPLANT
SUT MNCRL AB 4-0 PS2 18 (SUTURE) ×4 IMPLANT
SUT PROLENE 5 0 C 1 24 (SUTURE) IMPLANT
SUT PROLENE 6 0 BV (SUTURE) IMPLANT
SUT PROLENE 7 0 BV 1 (SUTURE) IMPLANT
SUT SILK 2 0 SH (SUTURE) IMPLANT
SUT VIC AB 2-0 CT1 27 (SUTURE)
SUT VIC AB 2-0 CT1 TAPERPNT 27 (SUTURE) IMPLANT
SUT VIC AB 3-0 SH 27 (SUTURE)
SUT VIC AB 3-0 SH 27X BRD (SUTURE) IMPLANT
SYR 10ML LL (SYRINGE) ×4 IMPLANT
SYR 20CC LL (SYRINGE) ×8 IMPLANT
SYR 30ML LL (SYRINGE) ×4 IMPLANT
SYR 5ML LL (SYRINGE) ×4 IMPLANT
SYR CONTROL 10ML LL (SYRINGE) ×4 IMPLANT
SYR MEDRAD MARK V 150ML (SYRINGE) IMPLANT
TAPE CLOTH SURG 4X10 WHT LF (GAUZE/BANDAGES/DRESSINGS) ×4 IMPLANT
TOWEL GREEN STERILE FF (TOWEL DISPOSABLE) ×4 IMPLANT
TOWEL OR 17X26 4PK STRL BLUE (TOWEL DISPOSABLE) ×4 IMPLANT
TRAY FOLEY W/METER SILVER 16FR (SET/KITS/TRAYS/PACK) IMPLANT
TUBING EXTENTION W/L.L. (IV SETS) IMPLANT
TUBING HIGH PRESSURE 120CM (CONNECTOR) ×4 IMPLANT
UNDERPAD 30X30 (UNDERPADS AND DIAPERS) IMPLANT
WATER STERILE IRR 1000ML POUR (IV SOLUTION) ×4 IMPLANT
WIRE BENTSON .035X145CM (WIRE) ×4 IMPLANT

## 2017-05-31 NOTE — Anesthesia Preprocedure Evaluation (Signed)
Anesthesia Evaluation  Patient identified by MRN, date of birth, ID bandGeneral Assessment Comment:Falling asleep mid conversation, obstructive during sleeping  Reviewed: Allergy & Precautions, H&P , NPO status , Patient's Chart, lab work & pertinent test results  History of Anesthesia Complications Negative for: history of anesthetic complications  Airway Mallampati: III  TM Distance: >3 FB Neck ROM: Full    Dental no notable dental hx. (+) Teeth Intact, Dental Advisory Given   Pulmonary shortness of breath, sleep apnea and Continuous Positive Airway Pressure Ventilation ,    Pulmonary exam normal breath sounds clear to auscultation       Cardiovascular hypertension, Pt. on medications  Rhythm:Regular Rate:Normal  ECG: ST, rate 127   Neuro/Psych  Headaches, Anxiety  Neuromuscular disease    GI/Hepatic Neg liver ROS, GERD  Medicated and Controlled,  Endo/Other  diabetes, Insulin DependentMorbid obesity  Renal/GU ESRF and DialysisRenal diseaseOn HD T, R, Sa  negative genitourinary   Musculoskeletal   Abdominal   Peds  Hematology  (+) anemia ,   Anesthesia Other Findings Super obese, BMI 54 gout  Reproductive/Obstetrics negative OB ROS                             Anesthesia Physical Anesthesia Plan  ASA: III  Anesthesia Plan: MAC   Post-op Pain Management:    Induction:   PONV Risk Score and Plan: 1 and Ondansetron  Airway Management Planned: Nasal Cannula and Simple Face Mask  Additional Equipment: None  Intra-op Plan:   Post-operative Plan:   Informed Consent: I have reviewed the patients History and Physical, chart, labs and discussed the procedure including the risks, benefits and alternatives for the proposed anesthesia with the patient or authorized representative who has indicated his/her understanding and acceptance.   Dental advisory given  Plan Discussed with: CRNA  and Surgeon  Anesthesia Plan Comments:         Anesthesia Quick Evaluation

## 2017-05-31 NOTE — Op Note (Signed)
    Patient name: Shane Hamilton MRN: [id number] DOB: 1966-03-17 Sex: male  05/31/2017 Pre-operative Diagnosis: esrd, malfunctioning right femoral tunneled catheter Post-operative diagnosis:  Same Surgeon:  Eda Paschal. Donzetta Matters, MD Procedure Performed: 1.  Central venogram 2.  Balloon angioplasty of right common and external iliac veins with 8 x 40m  3.  Placement of 55cm tunneled dialysis catheter  Indications:  51year old male with history end-stage renal disease multiple upper extremity accesses and a failed left femoral graft. He has an existing right femoral tunnel catheter that is now malfunctioning and is indicated for exchange and central venography for evaluation of possible right femoral grafting.  Findings: The catheter itself would not flush easily prior to exchange. Central venogram demonstrated a fibrin sheath from the external iliac vein through the common iliac vein. His IVC was patent without any evidence of scarring. At completion venogram following balloon angioplasty there was no further evidence of fibrin sheath and tunneled catheter was placed at the terminal aspect of the IVC.   Procedure:  The patient was identified in the holding area and taken to to the operating room where he was placed supine on the operating table Mac anesthesia was induced. He was sterilely prepped and draped in the right groin for tunneled catheter exchange venogram given antibiotics and timeout called. We began with anesthetizing the right groin with 1% lidocaine with epinephrine. We then placed an Amplatz wire through the red port of the existing catheter and removed the catheter over the wire. A long 8 French sheath was then placed. Venography was performed demonstrating patent IVC with fibrin sheath of the common and external iliac veins on the right. We then selected an 8 mm balloon and performed balloon antroplasty the fibrin sheath with good resolution. Patient was having some respiratory issues  given his baseline pulmonary status as well as size. With this we elected to exchange for 55 cm tunnel catheter over the wire and the wire was removed. Catheter was trimmed to size and assembled and flushed easily. It was then affixed the skin with 3-0 nylon suture. Dry dressings were placed and the catheter was locked with heparin to the manufacturer's recommendation. Patient did tolerate procedure well without immediate complication.    Brandon C. CDonzetta Matters MD [facility] GFairmountOffice: [phone]: [phone]

## 2017-05-31 NOTE — Progress Notes (Signed)
Pt refusing CPAP tonight. Advised pt to notify RN if he changes his mind and RT will assist.

## 2017-05-31 NOTE — Progress Notes (Signed)
ANTICOAGULATION CONSULT NOTE  Pharmacy Consult for warfarin Indication: history of PE  Patient Measurements: Height: 5\' 11"  (180.3 cm) Weight: (!) 390 lb 7 oz (177.1 kg) (question accuracy of bedscale) IBW/kg (Calculated) : 75.3   Vital Signs: Temp: 98.1 F (36.7 C) (08/27 0930) Temp Source: Oral (08/27 0930) BP: 131/84 (08/27 0930) Pulse Rate: 115 (08/27 0930)  Labs:  Recent Labs  05/29/17 1141 05/30/17 0230 05/30/17 1048 05/31/17 0733  HGB 7.1* 6.5* 8.5*  --   HCT 23.1* 21.2* 27.9*  --   PLT 355 311 389  --   LABPROT  --   --  19.3* 18.8*  INR  --   --  1.61 1.55  CREATININE 14.73* 15.84*  --  14.49*    Estimated Creatinine Clearance: 10 mL/min (A) (by C-G formula based on SCr of 14.49 mg/dL (H)).  Assessment: 38 YOM admitted on 8/25 due to HD cath not working, now planning on placement of a new femoral TDC by VVS. The patient was on warfarin PTA for hx DVT/PE - pharmacy consulted to resume dosing this admission.   INR today remains SUBtherapeutic (INR 1.55 << 1.61, goal of 2-3). Per discussion with VVS - no additional procedures planned this admission after today - okay with continuing warfarin dosing. No CBC today - Hgb came up to 8.5 << 6.5 after 1 unit PRBC on 8/26. No active bleeding noted at this time.   Goal of Therapy:  INR 2-3 Monitor platelets by anticoagulation protocol: Yes   Plan:  1. Repeat Warfarin 7.5 mg x 1 dose at 1800 today 2. Will continue to monitor for any signs/symptoms of bleeding and will follow up with PT/INR in the a.m.   Thank you for allowing pharmacy to be a part of this patient's care.  Georgina Pillion, PharmD, BCPS Clinical Pharmacist Pager: [phone] Clinical phone for 05/31/2017 from 7a-3:30p: [phone] If after 3:30p, please call main pharmacy at: [phone] 05/31/2017 10:35 AM

## 2017-05-31 NOTE — Transfer of Care (Signed)
Immediate Anesthesia Transfer of Care Note  Patient: Shane Hamilton  Procedure(s) Performed: Procedure(s): RIGHT FEMORAL TDC EXCHANGE (Right) RIGHT ILIAC AND CENTRAL VENOGRAM (Right) BALLOON ANGIOPLASTY COMMON AND EXTERNAL ILIAC VEIN (Right)  Patient Location: PACU  Anesthesia Type:MAC  Level of Consciousness: awake, alert  and oriented  Airway & Oxygen Therapy: Patient Spontanous Breathing and Patient connected to face mask oxygen  Post-op Assessment: Report given to RN, Post -op Vital signs reviewed and stable and Patient moving all extremities X 4  Post vital signs: Reviewed and stable  Last Vitals:  Vitals:   05/31/17 0930 05/31/17 1811  BP: 131/84 (!) 143/67  Pulse: (!) 115 (!) 108  Resp: 18 10  Temp: 36.7 C   SpO2: 94% 100%    Last Pain:  Vitals:   05/31/17 1811  TempSrc:   PainSc: 0-No pain         Complications: No apparent anesthesia complications

## 2017-05-31 NOTE — Progress Notes (Signed)
  Progress Note    05/31/2017 4:16 PM Day of Surgery  Subjective:  No complaints at this time.   Vitals:   05/31/17 0441 05/31/17 0930  BP: 138/62 131/84  Pulse: (!) 106 (!) 115  Resp: 20 18  Temp: 98.5 F (36.9 C) 98.1 F (36.7 C)  SpO2: 92% 94%    Physical Exam: aaox3 Mildly labored breathing Right femoral tdc with bandage in place  CBC    Component Value Date/Time   WBC 9.7 05/30/2017 1048   RBC 2.95 (L) 05/30/2017 1048   HGB 8.5 (L) 05/30/2017 1048   HGB 8.5 (L) 04/24/2014 1434   HCT 27.9 (L) 05/30/2017 1048   HCT 26.9 (L) 04/24/2014 1434   PLT 389 05/30/2017 1048   PLT 309 04/24/2014 1434   MCV 94.6 05/30/2017 1048   MCV 111 (H) 04/24/2014 1434   MCH 28.8 05/30/2017 1048   MCHC 30.5 05/30/2017 1048   RDW 19.4 (H) 05/30/2017 1048   RDW 19.9 (H) 04/24/2014 1434   LYMPHSABS 2.1 05/29/2017 1141   MONOABS 0.6 05/29/2017 1141   EOSABS 0.2 05/29/2017 1141   BASOSABS 0.0 05/29/2017 1141    BMET    Component Value Date/Time   NA 138 05/31/2017 0733   K 4.3 05/31/2017 0733   K 3.6 04/24/2014 1434   CL 100 (L) 05/31/2017 0733   CO2 22 05/31/2017 0733   GLUCOSE 125 (H) 05/31/2017 0733   BUN 82 (H) 05/31/2017 0733   CREATININE 14.49 (H) 05/31/2017 0733   CALCIUM 8.2 (L) 05/31/2017 0733   GFRNONAA 3 (L) 05/31/2017 0733   GFRAA 4 (L) 05/31/2017 0733    INR    Component Value Date/Time   INR 1.55 05/31/2017 0733   INR 1.4 04/24/2014 1434     Intake/Output Summary (Last 24 hours) at 05/31/17 1616 Last data filed at 05/31/17 0900  Gross per 24 hour  Intake              243 ml  Output                0 ml  Net              243 ml     Assessment:  51 y.o. male is here with malfunctioning right femoral tdc. He has limited options for access.   Plan: OR today for right iliac vein/ivc venography with possible intervention. Placement of new right femoral tdc.    Shane Hamilton C. Randie Heinz, MD [facility] [place] Office:  [phone] Pager: [phone]  05/31/2017 4:16 PM

## 2017-05-31 NOTE — Progress Notes (Signed)
Subjective:   No current cos/ tells me "do not have to put me to sleep to exchange this HD cath , last 2 times was awake" he will dw VVS  Objective Vital signs in last 24 hours: Vitals:   05/30/17 1825 05/30/17 2113 05/31/17 0441 05/31/17 0930  BP: (!) 154/90 (!) 109/54 138/62 131/84  Pulse: 100 (!) 115 (!) 106 (!) 115  Resp:   20 18  Temp: 98.5 F (36.9 C) 98.5 F (36.9 C) 98.5 F (36.9 C) 98.1 F (36.7 C)  TempSrc: Oral Oral Oral Oral  SpO2:  90% 92% 94%  Weight:      Height:       Weight change:   Physical Exam: General: alert Morbidly obese AAM NAD sitting on side of bed  Heart: RRR distant  No rub or mur appreciated  Lungs: CTA , non labored  breathing  Abdomen: morbidly obese soft Nt Extremities:bilat 2 + pedal edema   Dialysis Access: R Fem HD Perm cath  /Clotted left medial thigh graft  Dialysis Orders:  NW TTS 6 hr - 400/800 EDW 181 2K 2.25 Ca heparin 15 K with 10 K mid treatment Mircera 225 q 2 weeks - last given 8/16 Hectorol 3  Problem/Plan: 1. Clotted left thigh graft - placed 4/18 - declotted by IR 5/18 and thrombectomy and rev 6/18 by Dr. Edilia Bo - noted to be reclotted 8/21; was to be referred for declot - noted Dr. Imogene Burn thinks Not salvageable   Consider new AVGG in future after Infect clears. 2. Poorly functionally TDC - limped through treatment  Sat  05/29/17  night- TDC to be replaced today  per VVS ( Dr Randie Heinz )and noted per Dr.Chen " iliac and central venogram to evaluate the patency of the iliac venous tract and IVC" 3. Anemia - hgb 6.5 yest  > 8.5 this am  - received 1 unit PRBC after blood draw;per MB PA-C "refused to do outpatient stool cards; last tsat 12% getting short courses of Fe - hard to know if Fe deficit due to not getting enough Fe due to ^ ferritin or GI blood loss - on both - he is on coumadin - have ordered FOBT" Mircera 225 mcg given last 05/20/17  4. ESRD - K 4.3 BUN 82 Cr 14.49  due to under dialysis from poorly functional catheter 5. Secondary  hyperparathyroidism - binders/ hectorol phos 7.2 >5.9  6. HTN/volume - Sat. 8/25 at dialysis presented 1 kg BELOW edw at 180 with LE edema BP 138/62 - need to titrate EDW down!- UF on HD tomor. 7. Nutrition - renal diet/vits - liberalized to regular with fluid restriction per patient requested - admonished to make prudent selections. 8. 2/2 + Staph lugdunensis bacteremia 8/21- catheter was not removed, planned to try and treat through. Was sens to Vanc; BC redrawn 8/22 no growth pending-  Pharm  Dosing Vanco  9. Morbid obesity 10. 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 DM - per primary   Lenny Pastel, PA-C [facility] Beeper [phone] 05/31/2017,10:57 AM  LOS: 2 days   Renal Attending: I agree with note above and plans for cath replacement today. Astrid Vides C  Labs: Basic Metabolic Panel:  Recent Labs Lab 05/26/17 0522 05/29/17 1141 05/30/17 0230 05/31/17 0733  NA 134* 137 137 138  K 4.3 3.8 4.2 4.3  CL  97* 96* 97* 100*  CO2 23 24 24 22   GLUCOSE 154* 100* 199* 125*  BUN 75* 84* 92* 82*  CREATININE 13.18* 14.73* 15.84* 14.49*  CALCIUM 8.8* 7.8* 7.5* 8.2*  PHOS 3.6  --  7.2* 5.9*   Liver Function Tests:  Recent Labs Lab 05/25/17 1348 05/29/17 1141 05/30/17 0230 05/31/17 0733  AST 26 21  --   --   ALT 24 22  --   --   ALKPHOS 131* 120  --   --   BILITOT 0.6 0.7  --   --   PROT 8.2* 8.2*  --   --   ALBUMIN 3.0* 2.9* 2.6* 2.7*   No results for input(s): LIPASE, AMYLASE in the last 168 hours. No results for input(s): AMMONIA in the last 168 hours. CBC:  Recent Labs Lab 05/25/17 1348 05/26/17 0522 05/29/17 1141 05/30/17 0230 05/30/17 1048  WBC 8.9 10.0 9.4 8.5 9.7  NEUTROABS  --   --  6.6  --   --   HGB 7.6* 7.3* 7.1* 6.5* 8.5*  HCT 24.7* 24.3* 23.1* 21.2* 27.9*  MCV 95.0 95.7 94.7 94.6 94.6  PLT  341 318 355 311 389   Cardiac Enzymes: No results for input(s): CKTOTAL, CKMB, CKMBINDEX, TROPONINI in the last 168 hours. CBG:  Recent Labs Lab 05/30/17 1145 05/30/17 1757 05/30/17 1851 05/30/17 2108 05/31/17 0752  GLUCAP 150* 56* 109* 149* 126*    Studies/Results: No results found. Medications: . sodium chloride    . sodium chloride    . sodium chloride    . sodium chloride     . allopurinol  600 mg Oral Daily  . calcium acetate  2,001 mg Oral TID WC  . cinacalcet  60 mg Oral Q breakfast  . clonazePAM  2 mg Oral QPM  . colchicine  1.2 mg Oral Daily  . [START ON 06/03/2017] darbepoetin (ARANESP) injection - DIALYSIS  200 mcg Intravenous Q Thu-HD  . [START ON 06/01/2017] doxercalciferol  3 mcg Intravenous Q T,Th,Sa-HD  . insulin aspart  35 Units Subcutaneous TID WC  . insulin detemir  70 Units Subcutaneous Q2200  . lanthanum  1,000 mg Oral TID WC  . multivitamin  1 tablet Oral QHS  . nortriptyline  10 mg Oral QHS  . pantoprazole  40 mg Oral Daily  . sodium chloride flush  3 mL Intravenous Q12H  . warfarin  7.5 mg Oral ONCE-1800  . Warfarin - Pharmacist Dosing Inpatient   Does not apply [phone]

## 2017-05-31 NOTE — Progress Notes (Signed)
Pharmacy Antibiotic Note  Shane Hamilton is a 51 y.o. male admitted on 05/29/2017 with HD catheter issues. Patient is ESRD on HD, normally TTSat. Was discharged on Wednesday 8/22 with plans to treat CoNC bacteremia for 2 weeks- last day of therapy 06/08/17. Pharmacy was consulted to continue Vancomycin dosing this admission.   The patient's Vancomycin random level today is therapeutic (VR 24 mcg/ml, goal of 15-25 mcg/ml). The patient was intended to have a [facility] exchange followed by HD today however the OR time was delayed so HD has been pushed to 8/28. Given the patient's history of poor tolerance of HD sessions (low BFR of 200) - will continue with lower 1g dose for now.   Plan: 1. Vancomycin 1g post HD scheduled for 8/28 2. Will follow-up HD plans and tolerance for additional doses  Height: 5\' 11"  (180.3 cm) Weight: (!) 390 lb 7 oz (177.1 kg) IBW/kg (Calculated) : 75.3  Temp (24hrs), Avg:98.4 F (36.9 C), Min:98.1 F (36.7 C), Max:98.5 F (36.9 C)   Recent Labs Lab 05/25/17 1348 05/25/17 1851 05/25/17 2212 05/26/17 0522 05/29/17 1141 05/30/17 0230 05/30/17 1048 05/31/17 0733 05/31/17 1212  WBC 8.9  --   --  10.0 9.4 8.5 9.7  --   --   CREATININE 15.29*  --   --  13.18* 14.73* 15.84*  --  14.49*  --   LATICACIDVEN  --  0.8 1.2  --   --   --   --   --   --   VANCORANDOM  --   --   --   --   --   --   --   --  24    Estimated Creatinine Clearance: 10 mL/min (A) (by C-G formula based on SCr of 14.49 mg/dL (H)).    Allergies  Allergen Reactions  . Aspirin Other (See Comments)    GI upset    Vancomycin 8/22>>  8/21 BCx: 2/2 CoNS 8/22 BCx: ngtd 8/22 MRSA PCR: neg  Thank you for allowing pharmacy to be a part of this patient's care.  Georgina Pillion, PharmD, BCPS Clinical Pharmacist Pager: [phone] Clinical phone for 05/31/2017 from 7a-3:30p: [phone] If after 3:30p, please call main pharmacy at: [phone] 05/31/2017 3:54 PM

## 2017-05-31 NOTE — Progress Notes (Signed)
Progress Note    Shane Hamilton  XOV:[id number] DOB: December 08, 1965  DOA: 05/29/2017 PCP: Camille Bal, MD    Brief Narrative:   Chief complaint: F/U bacteremia  Medical records reviewed and are as summarized below:  Shane Hamilton is an 51 y.o. male with a PMH of uncontrolled diabetes, ESRD on HD TTHS, morbid obesity, OSA on CPAP, hypertension, and DVT on chronic anticoagulation who was admitted 05/25/17-05/26/17 for evaluation of generalized weakness. Of note, unable to have dialysis on the date of admission due to catheter issue, but catheter was functional during his hospital stay and he was successfully dialyzed. Blood cultures were positive for Staphylococcus lugdunensis (coagulase negative), and ID did recommend 2 weeks of vancomycin with HD. He subsequently was discharged home 05/26/17 and went to his dialysis treatment 05/29/17, but once again his catheter was nonfunctional and could not be dialyzed and therefore he was re-admitted.  Assessment/Plan:   Principal Problem:   Staphylococcus lugdunensis bacteremia ID initially recommended 2 weeks of vancomycin with HD treatments. Continue vancomycin. Monitor for toxicity. Spoke with Dr. Orvan Falconer who recommends checking a 2 D echo since S. lugdunensis behaves more like S. Aureus than a coag negative staph, and if there is suspicion of endocarditis, will need 4 weeks of therapy with Vancomycin.  Active Problems:   Hypertension Blood pressure better today, status post HD.     Obstructive sleep apnea Continue CPAP at bedtime.    DVT (deep venous thrombosis) (HCC) Coumadin per pharmacy. INR 1.55.    Type 2 diabetes mellitus, uncontrolled (HCC) Continue home regimen of Levemir 70 units with NovoLog meal coverage. CBG 56-150. Decrease Levemir given low blood glucose.    Morbid obesity (HCC) BMI is 55.04 kg/m.    Gout Continue colchicine and allopurinol.    End-stage renal disease on hemodialysis (HCC)/metabolic bone  disease/secondary hyperparathyroidism Nephrology managing. TDC catheter to be replaced 05/31/17.    Diabetic polyneuropathy associated with diabetes mellitus due to underlying condition (HCC) Continue Neurontin.    Anemia due to end stage renal disease (HCC) Monitor hemoglobin. Last Mircera on 05/20/17. Hemoglobin 6.5 mg/dL and received 1 unit of PRBCs 05/30/17. Fecal occult blood testing ordered.  Family Communication/Anticipated D/C date and plan/Code Status   DVT prophylaxis: Coumadin ordered. Code Status: Full Code.  Family Communication: No family at the bedside. Disposition Plan: Home after HD access definitively addressed.   Medical Consultants:    Nephrology  Vascular Surgery   Anti-Infectives:    Vancomycin  Subjective:   Shane Hamilton is upset that he is NPO.  Feels misinformed despite the fact that the RN tells me that he specifically explained to him.   Objective:    Vitals:   05/30/17 0923 05/30/17 1825 05/30/17 2113 05/31/17 0441  BP: (!) 144/100 (!) 154/90 (!) 109/54 138/62  Pulse: (!) 109 100 (!) 115 (!) 106  Resp: (!) 26   20  Temp: 97.9 F (36.6 C) 98.5 F (36.9 C) 98.5 F (36.9 C) 98.5 F (36.9 C)  TempSrc: Oral Oral Oral Oral  SpO2:   90% 92%  Weight:      Height:        Intake/Output Summary (Last 24 hours) at 05/31/17 0736 Last data filed at 05/30/17 2240  Gross per 24 hour  Intake              843 ml  Output                1 ml  Net  842 ml   Filed Weights   05/29/17 1900 05/30/17 0314 05/30/17 0630  Weight: (!) 179.2 kg (395 lb 1 oz) (!) 179.3 kg (395 lb 4.6 oz) (!) 177.1 kg (390 lb 7 oz)    Exam: General: No acute distress.Morbidly obese. Cardiovascular: Heart sounds show a regular rate, and rhythm. No gallops or rubs. No murmurs. No JVD. Lungs: Clear to auscultation bilaterally with good air movement. No rales, rhonchi or wheezes. Abdomen: Soft, nontender, nondistended with normal active bowel sounds. No masses. No  hepatosplenomegaly. Neurological: Alert and oriented 3. Moves all extremities 4 with equal strength. Cranial nerves II through XII grossly intact. Skin: Warm and dry. No rashes or lesions. Extremities: No clubbing or cyanosis. Lower extremity edema. Pedal pulses 2+. Psychiatric: Mood and affect are depressed/irritable. Insight and judgment are reasonable.  Data Reviewed:   I have personally reviewed following labs and imaging studies:  Labs: Labs show the following: Sodium 137, potassium 4.2, chloride 97, bicarbonate 24, BUN 92, creatinine 15.84, glucose 199. Phosphorus 7.2, albumin 2.6. WBC 8.5, hemoglobin 6.5, hematocrit 21.2, platelets 311.  Microbiology  05/26/17: Repeat blood cultures: Negative to date.  Procedures and diagnostic studies:  No results found.  Medications:   . allopurinol  600 mg Oral Daily  . calcium acetate  2,001 mg Oral TID WC  . cinacalcet  60 mg Oral Q breakfast  . clonazePAM  2 mg Oral QPM  . colchicine  1.2 mg Oral Daily  . [START ON 06/03/2017] darbepoetin (ARANESP) injection - DIALYSIS  200 mcg Intravenous Q Thu-HD  . [START ON 06/01/2017] doxercalciferol  3 mcg Intravenous Q T,Th,Sa-HD  . insulin aspart  35 Units Subcutaneous TID WC  . insulin detemir  70 Units Subcutaneous Q2200  . lanthanum  1,000 mg Oral TID WC  . multivitamin  1 tablet Oral QHS  . nortriptyline  10 mg Oral QHS  . pantoprazole  40 mg Oral Daily  . sodium chloride flush  3 mL Intravenous Q12H  . Warfarin - Pharmacist Dosing Inpatient   Does not apply q1800   Continuous Infusions: . sodium chloride    . sodium chloride    . sodium chloride    . sodium chloride       LOS: 2 days   RAMA,CHRISTINA  [facility] Pager [phone]. If unable to reach me by pager, please call my cell phone at [phone].  *Please refer to [email or web address], password [email or web address] to get updated schedule on who will round on this patient, as hospitalists switch teams weekly. If 7PM-7AM, please  contact night-coverage at [email or web address], password [email or web address] for any overnight needs.  05/31/2017, 7:36 AM

## 2017-06-01 ENCOUNTER — Other Ambulatory Visit (HOSPITAL_COMMUNITY): Payer: Medicare Other

## 2017-06-01 ENCOUNTER — Encounter (HOSPITAL_COMMUNITY): Payer: Self-pay | Admitting: Vascular Surgery

## 2017-06-01 LAB — CBC
HCT: 24.4 % — ABNORMAL LOW (ref 39.0–52.0)
Hemoglobin: 7.4 g/dL — ABNORMAL LOW (ref 13.0–17.0)
MCH: 28.7 pg (ref 26.0–34.0)
MCHC: 30.3 g/dL (ref 30.0–36.0)
MCV: 94.6 fL (ref 78.0–100.0)
PLATELETS: 369 10*3/uL (ref 150–400)
RBC: 2.58 MIL/uL — ABNORMAL LOW (ref 4.22–5.81)
RDW: 19.2 % — AB (ref 11.5–15.5)
WBC: 8.6 10*3/uL (ref 4.0–10.5)

## 2017-06-01 LAB — RENAL FUNCTION PANEL
Albumin: 2.6 g/dL — ABNORMAL LOW (ref 3.5–5.0)
Anion gap: 16 — ABNORMAL HIGH (ref 5–15)
BUN: 94 mg/dL — ABNORMAL HIGH (ref 6–20)
CALCIUM: 7.5 mg/dL — AB (ref 8.9–10.3)
CHLORIDE: 99 mmol/L — AB (ref 101–111)
CO2: 23 mmol/L (ref 22–32)
CREATININE: 16.36 mg/dL — AB (ref 0.61–1.24)
GFR calc Af Amer: 3 mL/min — ABNORMAL LOW (ref >60)
GFR calc non Af Amer: 3 mL/min — ABNORMAL LOW (ref >60)
GLUCOSE: 106 mg/dL — AB (ref 65–99)
Phosphorus: 7.3 mg/dL — ABNORMAL HIGH (ref 2.5–4.6)
Potassium: 4.7 mmol/L (ref 3.5–5.1)
SODIUM: 138 mmol/L (ref 135–145)

## 2017-06-01 LAB — VANCOMYCIN, RANDOM: Vancomycin Rm: 21

## 2017-06-01 LAB — PROTIME-INR
INR: 1.76
PROTHROMBIN TIME: 20.7 s — AB (ref 11.4–15.2)

## 2017-06-01 LAB — GLUCOSE, CAPILLARY: Glucose-Capillary: 160 mg/dL — ABNORMAL HIGH (ref 65–99)

## 2017-06-01 MED ORDER — PENTAFLUOROPROP-TETRAFLUOROETH EX AERO: 1.0000 "application " | INHALATION_SPRAY | CUTANEOUS | Status: DC | PRN

## 2017-06-01 MED ORDER — WARFARIN SODIUM 7.5 MG PO TABS
7.5000 mg | ORAL_TABLET | Freq: Once | ORAL | Status: AC
  Administered 2017-06-01: 7.5 mg via ORAL
  Filled 2017-06-01: qty 1

## 2017-06-01 MED ORDER — ALTEPLASE 2 MG IJ SOLR: 2.0000 mg | Freq: Once | INTRAMUSCULAR | Status: DC | PRN

## 2017-06-01 MED ORDER — SODIUM CHLORIDE 0.9 % IV SOLN
100.0000 mL | INTRAVENOUS | Status: DC | PRN
Start: 2017-06-01 — End: 2017-06-01

## 2017-06-01 MED ORDER — SODIUM CHLORIDE 0.9 % IV SOLN: 100.0000 mL | INTRAVENOUS | Status: DC | PRN

## 2017-06-01 MED ORDER — LIDOCAINE HCL (PF) 1 % IJ SOLN: 5.0000 mL | INTRAMUSCULAR | Status: DC | PRN

## 2017-06-01 MED ORDER — LIDOCAINE-PRILOCAINE 2.5-2.5 % EX CREA: 1.0000 "application " | TOPICAL_CREAM | CUTANEOUS | Status: DC | PRN

## 2017-06-01 MED ORDER — HEPARIN SODIUM (PORCINE) 1000 UNIT/ML DIALYSIS: 20.0000 [IU]/kg | INTRAMUSCULAR | Status: DC | PRN

## 2017-06-01 MED ORDER — POLYVINYL ALCOHOL 1.4 % OP SOLN
1.0000 [drp] | Freq: Four times a day (QID) | OPHTHALMIC | Status: DC | PRN
  Filled 2017-06-01: qty 15

## 2017-06-01 MED ORDER — VANCOMYCIN HCL IN DEXTROSE 1-5 GM/200ML-% IV SOLN
INTRAVENOUS | Status: AC
  Filled 2017-06-01: qty 200

## 2017-06-01 MED ORDER — WARFARIN SODIUM 5 MG PO TABS: 5.0000 mg | ORAL_TABLET | Freq: Every day | ORAL | 3 refills | Status: AC

## 2017-06-01 MED ORDER — HEPARIN SODIUM (PORCINE) 1000 UNIT/ML DIALYSIS: 1000.0000 [IU] | INTRAMUSCULAR | Status: DC | PRN

## 2017-06-01 MED ORDER — DOXERCALCIFEROL 4 MCG/2ML IV SOLN
INTRAVENOUS | Status: AC
  Filled 2017-06-01: qty 2

## 2017-06-01 NOTE — Progress Notes (Signed)
Patient refuses bed alarm. Education provided. Will continue to monitor.

## 2017-06-01 NOTE — Discharge Summary (Signed)
Physician Discharge Summary  Shane Hamilton ZOX:[id number] DOB: [birth year] DOA: 05/29/2017  PCP: Camille Bal, MD  Admit date: 05/29/2017 Discharge date: 06/01/2017  Admitted From: Home Discharge disposition: Home   Recommendations for Outpatient Follow-Up:   1. Will need Vancomycin for 2-4 weeks (2 weeks if Echo negative for vegetations, 4 weeks if vegetations present). Echo performed 06/01/17, results pending. 2. Consider dosage reduction of Allopurinol and colchicine. 3. Needs close F/U of PT/INR and adjustment of Coumadin to maintain therapeutic INR.   Discharge Diagnosis:   Principal Problem:   Staphylococcus lugdunensis bacteremia Active Problems:   Obstructive sleep apnea   DVT (deep venous thrombosis) (HCC)   Type 2 diabetes mellitus, uncontrolled (HCC)   Morbid obesity (HCC)   Gout   End-stage renal disease on hemodialysis (HCC)   Diabetic polyneuropathy associated with diabetes mellitus due to underlying condition (HCC)   Anemia due to end stage renal disease (HCC)  Discharge Condition: Improved.  Diet recommendation: Low sodium, heart healthy.  Carbohydrate-modified.    History of Present Illness:   Shane Hamilton is an 51 y.o. male with a PMH of uncontrolled diabetes, ESRD on HD TTHS, morbid obesity, OSA on CPAP, hypertension, and DVT on chronic anticoagulation who was admitted 05/25/17-8/22/18for evaluation of generalized weakness. Of note, unable to have dialysis on the date of admission due to catheter issue, but catheter was functional during his hospital stay and he was successfully dialyzed. Blood cultures were positive for Staphylococcus lugdunensis (coagulase negative), and ID did recommend 2 weeks of vancomycin with HD. He subsequently was discharged home 05/26/17 and went to his dialysis treatment 05/29/17, but once again his catheter was nonfunctional and could not be dialyzed and therefore he was re-admitted.  Hospital Course by Problem:    Principal Problem: Staphylococcus lugdunensis bacteremia ID initially recommended 2 weeks of vancomycin with HD treatments. Continue vancomycin. Monitor for toxicity. Spoke with Dr. Orvan Falconer who recommends checking a 2 D echo since S. lugdunensis behaves more like S. Aureus than a coag negative staph, and if there is suspicion of endocarditis, will need 4 weeks of therapy with Vancomycin.2-D echo pending.  Active Problems:   Hypertension Blood pressure remains reasonable.   Obstructive sleep apnea Continue CPAP at bedtime.  DVT (deep venous thrombosis) (HCC) Coumadin per pharmacy. INR 1.76.  Type 2 diabetes mellitus, uncontrolled (HCC) Continue reduced dose Levemir of 60 units with NovoLog meal coverage. CBG 72-133.   Morbid obesity (HCC) BMI is 55.04 kg/m.  Gout Continue colchicine and allopurinol.  End-stage renal disease on hemodialysis (HCC)/metabolic bone disease/secondary hyperparathyroidism Nephrology managing. Status post central venogram, balloon angioplasty of right common and external iliac veins and placement of a tunneled dialysis catheter by Dr. Randie Heinz 05/31/17.  Diabetic polyneuropathy associated with diabetes mellitus due to underlying condition (HCC) Continue Neurontin.  Anemia due to end stage renal disease (HCC) Monitor hemoglobin. Last Mircera on 05/20/17. Hemoglobin 6.5 mg/dL on admission and received 1 unit of PRBCs 05/30/17. Fecal occult blood testing ordered. Hemoglobin 7.4 this morning.    Medical Consultants:    None.Nephrology  Vascular Surgery  Discharge Exam:   Vitals:   06/01/17 1343 06/01/17 1345  BP: 122/84 (!) 126/42  Pulse: (!) 103 99  Resp: 18   Temp: 97.7 F (36.5 C)   SpO2: 94%    Vitals:   06/01/17 1315 06/01/17 1330 06/01/17 1343 06/01/17 1345  BP: (!) 156/90 (!) 155/74 122/84 (!) 126/42  Pulse: (!) 101 (!) 105 (!) 103 99  Resp:  18   Temp:   97.7 F (36.5 C)   TempSrc:   Oral   SpO2:   94%    Weight:      Height:        General exam: Appears calm and comfortable.  Respiratory system: Clear to auscultation. Respiratory effort normal. Cardiovascular system: S1 & S2 heard, RRR. No JVD,  rubs, gallops or clicks. No murmurs. Gastrointestinal system: Abdomen is nondistended, soft and nontender. No organomegaly or masses felt. Normal bowel sounds heard. Central nervous system: Alert and oriented. No focal neurological deficits. Extremities: No clubbing,  or cyanosis. + edema. HD catheter right thigh. Skin: No rashes, lesions or ulcers. Psychiatry: Judgement and insight appear normal. Mood & affect appropriate.    The results of significant diagnostics from this hospitalization (including imaging, microbiology, ancillary and laboratory) are listed below for reference.     Procedures and Diagnostic Studies:   No results found.   Labs:   Basic Metabolic Panel:  Recent Labs Lab 05/26/17 0522 05/29/17 1141 05/30/17 0230 05/31/17 0733 06/01/17 0839  NA 134* 137 137 138 138  K 4.3 3.8 4.2 4.3 4.7  CL 97* 96* 97* 100* 99*  CO2 23 24 24 22 23   GLUCOSE 154* 100* 199* 125* 106*  BUN 75* 84* 92* 82* 94*  CREATININE 13.18* 14.73* 15.84* 14.49* 16.36*  CALCIUM 8.8* 7.8* 7.5* 8.2* 7.5*  PHOS 3.6  --  7.2* 5.9* 7.3*   GFR Estimated Creatinine Clearance: 8.9 mL/min (A) (by C-G formula based on SCr of 16.36 mg/dL (H)). Liver Function Tests:  Recent Labs Lab 05/29/17 1141 05/30/17 0230 05/31/17 0733 06/01/17 0839  AST 21  --   --   --   ALT 22  --   --   --   ALKPHOS 120  --   --   --   BILITOT 0.7  --   --   --   PROT 8.2*  --   --   --   ALBUMIN 2.9* 2.6* 2.7* 2.6*   Coagulation profile  Recent Labs Lab 05/25/17 1857 05/26/17 0522 05/30/17 1048 05/31/17 0733 06/01/17 0445  INR 1.48 1.47 1.61 1.55 1.76    CBC:  Recent Labs Lab 05/26/17 0522 05/29/17 1141 05/30/17 0230 05/30/17 1048 06/01/17 0445  WBC 10.0 9.4 8.5 9.7 8.6  NEUTROABS  --  6.6  --    --   --   HGB 7.3* 7.1* 6.5* 8.5* 7.4*  HCT 24.3* 23.1* 21.2* 27.9* 24.4*  MCV 95.7 94.7 94.6 94.6 94.6  PLT 318 355 311 389 369   CBG:  Recent Labs Lab 05/31/17 1420 05/31/17 1813 05/31/17 1851 05/31/17 2145 06/01/17 1438  GLUCAP 92 72 76 133* 160*   Microbiology Recent Results (from the past 240 hour(s))  Blood culture (routine x 2)     Status: Abnormal   Collection Time: 05/25/17  6:52 PM  Result Value Ref Range Status   Specimen Description BLOOD LEFT HAND  Final   Special Requests IN PEDIATRIC BOTTLE Blood Culture adequate volume  Final   Culture  Setup Time   Final    GRAM POSITIVE COCCI IN CLUSTERS IN PEDIATRIC BOTTLE CRITICAL RESULT CALLED TO, READ BACK BY AND VERIFIED WITH: Artelia Laroche Pharm.D. 15:05 05/26/17 ([email or web address])    Culture STAPHYLOCOCCUS LUGDUNENSIS (A)  Final   Report Status 05/28/2017 FINAL  Final   Organism ID, Bacteria STAPHYLOCOCCUS LUGDUNENSIS  Final      Susceptibility   Staphylococcus lugdunensis - MIC*  CIPROFLOXACIN <=0.5 SENSITIVE Sensitive     ERYTHROMYCIN <=0.25 SENSITIVE Sensitive     GENTAMICIN 4 SENSITIVE Sensitive     OXACILLIN >=4 RESISTANT Resistant     TETRACYCLINE >=16 RESISTANT Resistant     VANCOMYCIN <=0.5 SENSITIVE Sensitive     TRIMETH/SULFA <=10 SENSITIVE Sensitive     CLINDAMYCIN <=0.25 SENSITIVE Sensitive     RIFAMPIN <=0.5 SENSITIVE Sensitive     Inducible Clindamycin NEGATIVE Sensitive     * STAPHYLOCOCCUS LUGDUNENSIS  Blood Culture ID Panel (Reflexed)     Status: Abnormal   Collection Time: 05/25/17  6:52 PM  Result Value Ref Range Status   Enterococcus species NOT DETECTED NOT DETECTED Final   Listeria monocytogenes NOT DETECTED NOT DETECTED Final   Staphylococcus species DETECTED (A) NOT DETECTED Final    Comment: Methicillin (oxacillin) resistant coagulase negative staphylococcus. Possible blood culture contaminant (unless isolated from more than one blood culture draw or clinical case suggests pathogenicity). No  antibiotic treatment is indicated for blood  culture contaminants. CRITICAL RESULT CALLED TO, READ BACK BY AND VERIFIED WITH: M. Mayford Knife Pharm.D. 15:05 05/26/17 ([email or web address])    Staphylococcus aureus NOT DETECTED NOT DETECTED Final   Methicillin resistance DETECTED (A) NOT DETECTED Final    Comment: CRITICAL RESULT CALLED TO, READ BACK BY AND VERIFIED WITH: M. Mayford Knife Pharm.D. 15:05 05/26/17 ([email or web address])    Streptococcus species NOT DETECTED NOT DETECTED Final   Streptococcus agalactiae NOT DETECTED NOT DETECTED Final   Streptococcus pneumoniae NOT DETECTED NOT DETECTED Final   Streptococcus pyogenes NOT DETECTED NOT DETECTED Final   Acinetobacter baumannii NOT DETECTED NOT DETECTED Final   Enterobacteriaceae species NOT DETECTED NOT DETECTED Final   Enterobacter cloacae complex NOT DETECTED NOT DETECTED Final   Escherichia coli NOT DETECTED NOT DETECTED Final   Klebsiella oxytoca NOT DETECTED NOT DETECTED Final   Klebsiella pneumoniae NOT DETECTED NOT DETECTED Final   Proteus species NOT DETECTED NOT DETECTED Final   Serratia marcescens NOT DETECTED NOT DETECTED Final   Haemophilus influenzae NOT DETECTED NOT DETECTED Final   Neisseria meningitidis NOT DETECTED NOT DETECTED Final   Pseudomonas aeruginosa NOT DETECTED NOT DETECTED Final   Candida albicans NOT DETECTED NOT DETECTED Final   Candida glabrata NOT DETECTED NOT DETECTED Final   Candida krusei NOT DETECTED NOT DETECTED Final   Candida parapsilosis NOT DETECTED NOT DETECTED Final   Candida tropicalis NOT DETECTED NOT DETECTED Final  Blood culture (routine x 2)     Status: Abnormal   Collection Time: 05/25/17  6:57 PM  Result Value Ref Range Status   Specimen Description BLOOD LEFT ANTECUBITAL  Final   Special Requests   Final    BOTTLES DRAWN AEROBIC AND ANAEROBIC Blood Culture adequate volume   Culture  Setup Time   Final    GRAM POSITIVE COCCI IN BOTH AEROBIC AND ANAEROBIC BOTTLES CRITICAL VALUE NOTED.  VALUE IS CONSISTENT WITH  PREVIOUSLY REPORTED AND CALLED VALUE.    Culture (A)  Final    STAPHYLOCOCCUS LUGDUNENSIS SUSCEPTIBILITIES PERFORMED ON PREVIOUS CULTURE WITHIN THE LAST 5 DAYS.    Report Status 05/28/2017 FINAL  Final  MRSA PCR Screening     Status: None   Collection Time: 05/26/17  6:45 AM  Result Value Ref Range Status   MRSA by PCR NEGATIVE NEGATIVE Final    Comment:        The GeneXpert MRSA Assay (FDA approved for NASAL specimens only), is one component of a comprehensive MRSA colonization surveillance program. It is not intended to  diagnose MRSA infection nor to guide or monitor treatment for MRSA infections.   Culture, blood (Routine X 2) w Reflex to ID Panel     Status: None   Collection Time: 05/26/17  4:03 PM  Result Value Ref Range Status   Specimen Description BLOOD RIGHT HAND  Final   Special Requests   Final    BOTTLES DRAWN AEROBIC ONLY Blood Culture adequate volume   Culture NO GROWTH 5 DAYS  Final   Report Status 05/31/2017 FINAL  Final  Surgical pcr screen     Status: None   Collection Time: 05/31/17  6:56 AM  Result Value Ref Range Status   MRSA, PCR NEGATIVE NEGATIVE Final   Staphylococcus aureus NEGATIVE NEGATIVE Final    Comment:        The Xpert SA Assay (FDA approved for NASAL specimens in patients over 19 years of age), is one component of a comprehensive surveillance program.  Test performance has been validated by [facility] for patients greater than or equal to 39 year old. It is not intended to diagnose infection nor to guide or monitor treatment.      Discharge Instructions:   Discharge Instructions    Call MD for:  extreme fatigue    Complete by:  As directed    Call MD for:  temperature >100.4    Complete by:  As directed    Diet - low sodium heart healthy    Complete by:  As directed    Diet Carb Modified    Complete by:  As directed    Increase activity slowly    Complete by:  As directed      Allergies as of 06/01/2017       Reactions   Aspirin Other (See Comments)   GI upset       Medication List    TAKE these medications   allopurinol 300 MG tablet Commonly known as:  ZYLOPRIM Take 600 mg by mouth daily.   cinacalcet 60 MG tablet Commonly known as:  SENSIPAR Take 60 mg by mouth daily.   clonazePAM 1 MG tablet Commonly known as:  KLONOPIN Take 1 tablet (1 mg total) by mouth 2 (two) times daily as needed for anxiety. What changed:  how much to take  when to take this   colchicine 0.6 MG tablet Take 1.2 mg by mouth daily.   gabapentin 300 MG capsule Commonly known as:  NEURONTIN Take 1 capsule (300 mg total) by mouth 3 (three) times daily as needed (pain).   HYDROcodone-acetaminophen 5-325 MG tablet Commonly known as:  NORCO/VICODIN Take 1-2 tablets by mouth every 12 (twelve) hours as needed for severe pain.   lanthanum 1000 MG chewable tablet Commonly known as:  FOSRENOL Chew 1,000 mg by mouth 3 (three) times daily with meals.   LEVEMIR FLEXTOUCH 100 UNIT/ML Pen Generic drug:  Insulin Detemir INJECT 70 UNITS UNDER THE SKIN AT BEDTIME   lidocaine 5 % ointment Commonly known as:  XYLOCAINE Apply to feet twice daily as needed.  Use with gloves/cotton swab   multivitamin Tabs tablet Take 1 tablet by mouth daily.   nortriptyline 10 MG capsule Commonly known as:  PAMELOR Take 1 capsule (10 mg total) by mouth at bedtime.   NOVOLOG FLEXPEN 100 UNIT/ML FlexPen Generic drug:  insulin aspart Inject 35 Units into the skin 3 (three) times daily with meals.   omeprazole 40 MG capsule Commonly known as:  PRILOSEC Take 40 mg by mouth daily  oxyCODONE-acetaminophen 5-325 MG tablet Commonly known as:  ROXICET Take 1-2 tablets by mouth every 4 (four) hours as needed. What changed:  reasons to take this   PHOSLO 667 MG capsule Generic drug:  calcium acetate Take 2,001 mg by mouth 3 (three) times daily with meals. Also with snacks   traMADol 50 MG tablet Commonly known as:   ULTRAM Take 1 tablet (50 mg total) by mouth every 6 (six) hours as needed for moderate pain.   VISINE OP Place 1 drop into both eyes daily as needed (dry eyes).   warfarin 5 MG tablet Commonly known as:  COUMADIN Take 5 mg by mouth daily            Discharge Care Instructions        Start     Ordered   06/01/17 0000  Increase activity slowly     06/01/17 1500   06/01/17 0000  Diet - low sodium heart healthy     06/01/17 1500   06/01/17 0000  Diet Carb Modified     06/01/17 1500   06/01/17 0000  Call MD for:  temperature >100.4     06/01/17 1500   06/01/17 0000  Call MD for:  extreme fatigue     06/01/17 1500     Follow-up Information    Camille Bal, MD Follow up on 06/03/2017.   Specialty:  Nephrology Why:  At your HD appt on Thursday. Contact information: [address] [place] [address] [phone]            Time coordinating discharge: 35 minutes.  Signed:  RAMA,CHRISTINA  Pager [phone] [facility] 06/01/2017, 3:05 PM

## 2017-06-01 NOTE — Discharge Instructions (Signed)
Bacteremia °Bacteremia is the presence of bacteria in the blood. When bacteria enter the bloodstream, they can cause a life-threatening reaction called sepsis, which is a medical emergency. Bacteremia can spread to other parts of the body, including the heart, joints, and brain. °What are the causes? °This condition is caused by bacteria that get into the blood. Bacteria can enter the blood: °· From a skin infection or a cut on your skin. °· During an episode of pneumonia. °· From an infection in your stomach or intestine (gastrointestinal infection). °· From an infection in your bladder or urinary system (urinary tract infection). °· During a dental or medical procedure. °· After you brush your teeth so hard that your gums bleed. °· When a bacterial infection in another part of the body spreads to the blood. °· Through a dirty needle. ° °What increases the risk? °This condition is more likely to develop in: °· Children. °· Elderly adults. °· People who have a long-lasting (chronic) disease or medical condition. °· People who have an artificial joint or heart valve. °· People who have heart valve disease. °· People who have a tube, such as a catheter or IV tube, that has been inserted for a medical treatment. °· People who have a weak body defense system (immune system). °· People who use IV drugs. ° °What are the signs or symptoms? °Symptoms of this condition include: °· Fever. °· Chills. °· A racing heart. °· Shortness of breath. °· Dizziness. °· Weakness. °· Confusion. °· Nausea or vomiting. °· Diarrhea. ° °In some cases, there are no symptoms. Bacteremia that has spread to the other parts of the body may cause symptoms in those areas. °How is this diagnosed? °This condition may be diagnosed with a physical exam and tests, such as: °· A complete blood count (CBC). This test looks for signs of infection. °· Blood cultures. These look for bacteria in your blood. °· Tests of any tubes that you may have inserted into  your body, such as an IV tube or urinary catheter. These tests look for a source of infection. °· Urine tests including urine cultures. These look for bacteria in the urine that could be a source of infection. °· Imaging tests, such as an X-ray, CT scan, MRI, or heart ultrasound. These look for a source of infection in other parts of the body, such as the lungs, heart valves, or joints. ° °How is this treated? °This condition may be treated with: °· Antibiotic medicines given through an IV infusion. Depending on the source of infection, antibiotics may be needed for several weeks. At first, an antibiotic may be given to kill most types of blood bacteria. If your test results show that a certain kind of bacteria is causing problems, the antibiotic may be changed to kill only the bacteria that are causing problems. °· Antibiotics taken by mouth. °· IV fluids to support the body as you fight the infection. °· Removing any catheter or device that could be a source of infection. °· Blood pressure and breathing support, if you have sepsis. °· Surgery to control the source or spread of infection, such as: °? Removing an infected implanted device. °? Removing infected tissue or an abscess. ° °This condition is usually treated at a hospital. If you are treated at home, you may need to come back for medicines, blood tests, and evaluation. This is important. °Follow these instructions at home: °· Take over-the-counter and prescription medicines only as told by your health care provider. °·   If you were prescribed an antibiotic, take it as told by your health care provider. Do not stop taking the antibiotic even if you start to feel better. °· Rest until your condition is under control. °· Drink enough fluid to keep your urine clear or pale yellow. °· Do not smoke. If you need help quitting, ask your health care provider. °· Keep all follow-up visits as told by your health care provider. This is important. °How is this  prevented? °· Get the vaccinations that your health care provider recommends. °· Clean and cover any scrapes or cuts. °· Take good care of your skin. This includes regular bathing and moisturizing. °· Wash your hands often. °· Practice good oral hygiene. Brush your teeth two times a day and floss regularly. °Get help right away if: °· You have pain. °· You have a fever. °· You have trouble breathing. °· Your skin becomes blotchy, pale, or clammy. °· You develop confusion, dizziness, or weakness. °· You develop diarrhea. °· You develop any new symptoms after treatment. °Summary °· Bacteremia is the presence of bacteria in the blood. When bacteria enter the bloodstream, they can cause a life- threatening reaction called sepsis. °· Children and elderly adults are at increased risk of bacteremia. Other risk factors include having a long-lasting (chronic) disease or a weak immune system, having an artificial joint or heart valve, having heart valve disease, having tubes that were inserted in the body for medical treatment, or using IV drugs. °· Some symptoms of bacteremia include fever, chills, shortness of breath, confusion, nausea or vomiting, and diarrhea. °· Tests may be done to diagnose a source of infection that led to bacteremia. These tests may include blood tests, urine tests, and imaging tests. °· Bacteremia is usually treated with antibiotics, usually in a hospital. °This information is not intended to replace advice given to you by your health care provider. Make sure you discuss any questions you have with your health care provider. °Document Released: 07/05/2006 Document Revised: 08/18/2016 Document Reviewed: 08/18/2016 °Elsevier Interactive Patient Education © 2018 [employer] ° °

## 2017-06-01 NOTE — Progress Notes (Signed)
   Daily Progress Note  Catheter exchanged yesterday with venogram.  Looks like proximal vein still patent so patient remains a candidate for a right thigh AVG with Accuseal after removal of tunneled dialysis catheter.  - No AVG planned while bacteremic. - Will have pt follow up in 4 weeks in the office to re-evaluate for thigh AVG. - Cont abx to sterilize blood stream.   Leonides Sake, MD, FACS [facility] Office: [phone] Pager: [phone]  06/01/2017, 7:30 AM

## 2017-06-01 NOTE — Progress Notes (Signed)
Shane Hamilton to be D/C'd Home per MD order.  Discussed prescriptions and follow up appointments with the patient. Prescriptions given to patient, medication list explained in detail. Pt verbalized understanding.  Allergies as of 06/01/2017      Reactions   Aspirin Other (See Comments)   GI upset       Medication List    TAKE these medications   allopurinol 300 MG tablet Commonly known as:  ZYLOPRIM Take 600 mg by mouth daily.   cinacalcet 60 MG tablet Commonly known as:  SENSIPAR Take 60 mg by mouth daily.   clonazePAM 1 MG tablet Commonly known as:  KLONOPIN Take 1 tablet (1 mg total) by mouth 2 (two) times daily as needed for anxiety. What changed:  how much to take  when to take this   colchicine 0.6 MG tablet Take 1.2 mg by mouth daily.   gabapentin 300 MG capsule Commonly known as:  NEURONTIN Take 1 capsule (300 mg total) by mouth 3 (three) times daily as needed (pain).   HYDROcodone-acetaminophen 5-325 MG tablet Commonly known as:  NORCO/VICODIN Take 1-2 tablets by mouth every 12 (twelve) hours as needed for severe pain.   lanthanum 1000 MG chewable tablet Commonly known as:  FOSRENOL Chew 1,000 mg by mouth 3 (three) times daily with meals.   LEVEMIR FLEXTOUCH 100 UNIT/ML Pen Generic drug:  Insulin Detemir INJECT 70 UNITS UNDER THE SKIN AT BEDTIME   lidocaine 5 % ointment Commonly known as:  XYLOCAINE Apply to feet twice daily as needed.  Use with gloves/cotton swab   multivitamin Tabs tablet Take 1 tablet by mouth daily.   nortriptyline 10 MG capsule Commonly known as:  PAMELOR Take 1 capsule (10 mg total) by mouth at bedtime.   NOVOLOG FLEXPEN 100 UNIT/ML FlexPen Generic drug:  insulin aspart Inject 35 Units into the skin 3 (three) times daily with meals.   omeprazole 40 MG capsule Commonly known as:  PRILOSEC Take 40 mg by mouth daily   oxyCODONE-acetaminophen 5-325 MG tablet Commonly known as:  ROXICET Take 1-2 tablets by mouth every 4  (four) hours as needed. What changed:  reasons to take this   PHOSLO 667 MG capsule Generic drug:  calcium acetate Take 2,001 mg by mouth 3 (three) times daily with meals. Also with snacks   traMADol 50 MG tablet Commonly known as:  ULTRAM Take 1 tablet (50 mg total) by mouth every 6 (six) hours as needed for moderate pain.   VISINE OP Place 1 drop into both eyes daily as needed (dry eyes).   warfarin 5 MG tablet Commonly known as:  COUMADIN Take 1 tablet (5 mg total) by mouth daily at 6 PM. Take 7.5 mg today only. What changed:  See the new instructions.            Discharge Care Instructions        Start     Ordered   06/01/17 0000  Increase activity slowly     06/01/17 1500   06/01/17 0000  Diet - low sodium heart healthy     06/01/17 1500   06/01/17 0000  Diet Carb Modified     06/01/17 1500   06/01/17 0000  Call MD for:  temperature >100.4     06/01/17 1500   06/01/17 0000  Call MD for:  extreme fatigue     06/01/17 1500   06/01/17 0000  warfarin (COUMADIN) 5 MG tablet  Daily-1800     06/01/17 1621  Vitals:   06/01/17 1343 06/01/17 1345  BP: 122/84 (!) 126/42  Pulse: (!) 103 99  Resp: 18   Temp: 97.7 F (36.5 C)   SpO2: 94%     Skin clean, dry and intact without evidence of skin break down, no evidence of skin tears noted. IV catheter discontinued intact. Site without signs and symptoms of complications. Dressing and pressure applied. Pt denies pain at this time. No complaints noted.  An After Visit Summary was printed and given to the patient. Patient escorted via WC, and D/C home via private auto.  Britt Bolognese RN, BSN

## 2017-06-01 NOTE — Progress Notes (Signed)
ANTICOAGULATION CONSULT NOTE  Pharmacy Consult for warfarin Indication: history of PE  Patient Measurements: Height: 5\' 11"  (180.3 cm) Weight: (!) 390 lb 10.5 oz (177.2 kg) IBW/kg (Calculated) : 75.3   Vital Signs: Temp: 98.8 F (37.1 C) (08/28 0825) Temp Source: Oral (08/28 0825) BP: 153/98 (08/28 1300) Pulse Rate: 104 (08/28 1300)  Labs:  Recent Labs  05/30/17 0230 05/30/17 1048 05/31/17 0733 06/01/17 0445 06/01/17 0839  HGB 6.5* 8.5*  --  7.4*  --   HCT 21.2* 27.9*  --  24.4*  --   PLT 311 389  --  369  --   LABPROT  --  19.3* 18.8* 20.7*  --   INR  --  1.61 1.55 1.76  --   CREATININE 15.84*  --  14.49*  --  16.36*    Estimated Creatinine Clearance: 8.9 mL/min (A) (by C-G formula based on SCr of 16.36 mg/dL (H)).  Assessment: 23 YOM admitted on 8/25 due to HD cath not working, now planning on placement of a new femoral TDC by [facility]. The patient was on warfarin 5mg  daily PTA for hx DVT/PE - pharmacy consulted to resume dosing this admission.   INR today remains subtherapeutic (INR 1.76 goal of 2-3). Per discussion with [facility] - no additional procedures planned this admission after 8/27 - okay with continuing warfarin dosing. Hgb  8.5>7.4 s/p 1 unit PRBC on 8/26. To reported bleeding at this time.   Goal of Therapy:  INR 2-3 Monitor platelets by anticoagulation protocol: Yes   Plan:  Give warfarin 7.5mg  PO x1 at 1800 Daily INR/CBC, and s/s of bleeding  Daylene Posey, PharmD Pharmacy Resident Pager #: [phone] 06/01/2017 1:09 PM

## 2017-06-01 NOTE — Progress Notes (Signed)
Problem/Plan: 1. Clotted left thigh graft - placed 4/18 - declot 5/18, rev 6/18 by Dr. Edilia Bo - Not salvageable ? new AVGG in future after Infect clears. 2.Poorly functionally TDC- removed and replaced after iliac vein dilatation on 05/31/17-Dr. Randie Heinz 3. Anemia - hgb 7.4g post procedure, Mircera 225 mcg given last 05/20/17  4. ESRD- TTS under dialysis from poorly functional catheter now replaced 6.HTN/volume-  need to titrate EDW down!-  8. 2/2 + Staph lugdunensis bacteremia 8/21- catheter was removed 8/27 and replaced.  Pharm  Dosing Vanco  9.   Morbid obesity 10. Anxiety/depression 11. 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 - CKA manages - levels are sometimes subtherapeutic( pt compliance sub optimal) - running    Procedure: Currently tolerating dialysis, BFR 300cc/min via new cath with goal of 4500cc  Subjective: Interval History: Cath replaced and removed  Objective: Vital signs in last 24 hours: Temp:  [97.4 F (36.3 C)-98.2 F (36.8 C)] 98 F (36.7 C) (08/28 0504) Pulse Rate:  [100-115] 100 (08/28 0504) Resp:  [10-20] 20 (08/28 0504) BP: (128-144)/(59-84) 136/59 (08/28 0504) SpO2:  [91 %-100 %] 98 % (08/28 0504) Weight:  [177.1 kg (390 lb 7 oz)-177.2 kg (390 lb 10.5 oz)] 177.2 kg (390 lb 10.5 oz) (08/27 2148) Weight change:   Intake/Output from previous day: 08/27 0701 - 08/28 0700 In: 372 [I.V.:372] Out: 5 [Blood:5] Intake/Output this shift: No intake/output data recorded.  General appearance: hoarse voice, very sleepy, not using CPAP  Lab Results:  Recent Labs  05/30/17 1048 06/01/17 0445  WBC 9.7 8.6  HGB 8.5* 7.4*  HCT 27.9* 24.4*  PLT 389 369   BMET:  Recent Labs  05/30/17 0230 05/31/17 0733  NA 137 138  K 4.2 4.3  CL 97* 100*  CO2 24 22  GLUCOSE 199* 125*  BUN 92* 82*  CREATININE 15.84* 14.49*  CALCIUM 7.5* 8.2*   No results for input(s): PTH in the last 72 hours. Iron Studies: No results for input(s): IRON, TIBC, TRANSFERRIN, FERRITIN in  the last 72 hours. Studies/Results: No results found.  Scheduled: . allopurinol  600 mg Oral Daily  . calcium acetate  2,001 mg Oral TID WC  . cinacalcet  60 mg Oral Q breakfast  . clonazePAM  2 mg Oral QPM  . colchicine  1.2 mg Oral Daily  . [START ON 06/03/2017] darbepoetin (ARANESP) injection - DIALYSIS  200 mcg Intravenous Q Thu-HD  . doxercalciferol  3 mcg Intravenous Q T,Th,Sa-HD  . insulin aspart  35 Units Subcutaneous TID WC  . insulin detemir  60 Units Subcutaneous Q2200  . lanthanum  1,000 mg Oral TID WC  . multivitamin  1 tablet Oral QHS  . nortriptyline  10 mg Oral QHS  . pantoprazole  40 mg Oral Daily  . sodium chloride flush  3 mL Intravenous Q12H  . Warfarin - Pharmacist Dosing Inpatient   Does not apply q1800    LOS: 3 days   Shane Hamilton C 06/01/2017,7:13 AM

## 2017-06-02 ENCOUNTER — Other Ambulatory Visit (HOSPITAL_COMMUNITY): Payer: Medicare Other

## 2017-06-02 LAB — BPAM RBC
Blood Product Expiration Date: 201808292359
Blood Product Expiration Date: 201809192359
ISSUE DATE / TIME: 201808260432
Unit Type and Rh: 6200
Unit Type and Rh: 6200

## 2017-06-02 LAB — TYPE AND SCREEN
ABO/RH(D): A POS
ANTIBODY SCREEN: NEGATIVE
UNIT DIVISION: 0
Unit division: 0

## 2017-06-02 NOTE — Anesthesia Postprocedure Evaluation (Signed)
Anesthesia Post Note  Patient: Shane Hamilton  Procedure(s) Performed: Procedure(s) (LRB): RIGHT FEMORAL TDC EXCHANGE (Right) RIGHT ILIAC AND CENTRAL VENOGRAM (Right) BALLOON ANGIOPLASTY COMMON AND EXTERNAL ILIAC VEIN (Right)     Patient location during evaluation: PACU Anesthesia Type: MAC Level of consciousness: patient cooperative and sedated Pain management: pain level controlled Vital Signs Assessment: post-procedure vital signs reviewed and stable Respiratory status: spontaneous breathing, nonlabored ventilation, respiratory function stable and patient connected to nasal cannula oxygen Cardiovascular status: stable and blood pressure returned to baseline Postop Assessment: no signs of nausea or vomiting Anesthetic complications: no    Last Vitals:  Vitals:   06/01/17 1343 06/01/17 1345  BP: 122/84 (!) 126/42  Pulse: (!) 103 99  Resp: 18   Temp: 36.5 C   SpO2: 94%     Last Pain:  Vitals:   06/01/17 1343  TempSrc: Oral  PainSc: 0-No pain                 Dvon Jiles

## 2017-06-03 ENCOUNTER — Telehealth: Payer: Self-pay | Admitting: Vascular Surgery

## 2017-06-03 DIAGNOSIS — B958 Unspecified staphylococcus as the cause of diseases classified elsewhere: Secondary | ICD-10-CM | POA: Diagnosis not present

## 2017-06-03 DIAGNOSIS — T8249XD Other complication of vascular dialysis catheter, subsequent encounter: Secondary | ICD-10-CM | POA: Diagnosis not present

## 2017-06-03 DIAGNOSIS — Z5181 Encounter for therapeutic drug level monitoring: Secondary | ICD-10-CM | POA: Diagnosis not present

## 2017-06-03 DIAGNOSIS — Z7901 Long term (current) use of anticoagulants: Secondary | ICD-10-CM | POA: Diagnosis not present

## 2017-06-03 DIAGNOSIS — D631 Anemia in chronic kidney disease: Secondary | ICD-10-CM | POA: Diagnosis not present

## 2017-06-03 DIAGNOSIS — N2581 Secondary hyperparathyroidism of renal origin: Secondary | ICD-10-CM | POA: Diagnosis not present

## 2017-06-03 DIAGNOSIS — N186 End stage renal disease: Secondary | ICD-10-CM | POA: Diagnosis not present

## 2017-06-03 NOTE — Telephone Encounter (Signed)
Sched appt 07/02/17 at 11:45. Lm on hm#.

## 2017-06-03 NOTE — Telephone Encounter (Signed)
-----   Message from Sharee PimpleMarilyn K McChesney, RN sent at 06/01/2017  8:19 AM EDT ----- Regarding: 4 weeks post AVG   ----- Message ----- From: Fransisco Hertzhen, Brian L, MD Sent: 06/01/2017   7:33 AM To: [address] [address]  Danise MinaReginald D Hamilton [phone] 01/18/1966  Follow-up: 4 weeks re: re-eval for R thigh AVG

## 2017-06-04 DIAGNOSIS — Z992 Dependence on renal dialysis: Secondary | ICD-10-CM | POA: Diagnosis not present

## 2017-06-04 DIAGNOSIS — E1122 Type 2 diabetes mellitus with diabetic chronic kidney disease: Secondary | ICD-10-CM | POA: Diagnosis not present

## 2017-06-04 DIAGNOSIS — N186 End stage renal disease: Secondary | ICD-10-CM | POA: Diagnosis not present

## 2017-06-05 DIAGNOSIS — N2581 Secondary hyperparathyroidism of renal origin: Secondary | ICD-10-CM | POA: Diagnosis not present

## 2017-06-05 DIAGNOSIS — N186 End stage renal disease: Secondary | ICD-10-CM | POA: Diagnosis not present

## 2017-06-05 DIAGNOSIS — B958 Unspecified staphylococcus as the cause of diseases classified elsewhere: Secondary | ICD-10-CM | POA: Diagnosis not present

## 2017-06-05 DIAGNOSIS — D509 Iron deficiency anemia, unspecified: Secondary | ICD-10-CM | POA: Diagnosis not present

## 2017-06-05 DIAGNOSIS — E1129 Type 2 diabetes mellitus with other diabetic kidney complication: Secondary | ICD-10-CM | POA: Diagnosis not present

## 2017-06-05 DIAGNOSIS — Z992 Dependence on renal dialysis: Secondary | ICD-10-CM | POA: Diagnosis not present

## 2017-06-05 DIAGNOSIS — D631 Anemia in chronic kidney disease: Secondary | ICD-10-CM | POA: Diagnosis not present

## 2017-06-08 DIAGNOSIS — N2581 Secondary hyperparathyroidism of renal origin: Secondary | ICD-10-CM | POA: Diagnosis not present

## 2017-06-08 DIAGNOSIS — D631 Anemia in chronic kidney disease: Secondary | ICD-10-CM | POA: Diagnosis not present

## 2017-06-08 DIAGNOSIS — D509 Iron deficiency anemia, unspecified: Secondary | ICD-10-CM | POA: Diagnosis not present

## 2017-06-08 DIAGNOSIS — B958 Unspecified staphylococcus as the cause of diseases classified elsewhere: Secondary | ICD-10-CM | POA: Diagnosis not present

## 2017-06-08 DIAGNOSIS — N186 End stage renal disease: Secondary | ICD-10-CM | POA: Diagnosis not present

## 2017-06-10 DIAGNOSIS — D631 Anemia in chronic kidney disease: Secondary | ICD-10-CM | POA: Diagnosis not present

## 2017-06-10 DIAGNOSIS — Z7901 Long term (current) use of anticoagulants: Secondary | ICD-10-CM | POA: Diagnosis not present

## 2017-06-10 DIAGNOSIS — N186 End stage renal disease: Secondary | ICD-10-CM | POA: Diagnosis not present

## 2017-06-10 DIAGNOSIS — N2581 Secondary hyperparathyroidism of renal origin: Secondary | ICD-10-CM | POA: Diagnosis not present

## 2017-06-10 DIAGNOSIS — D509 Iron deficiency anemia, unspecified: Secondary | ICD-10-CM | POA: Diagnosis not present

## 2017-06-10 DIAGNOSIS — B958 Unspecified staphylococcus as the cause of diseases classified elsewhere: Secondary | ICD-10-CM | POA: Diagnosis not present

## 2017-06-12 DIAGNOSIS — D631 Anemia in chronic kidney disease: Secondary | ICD-10-CM | POA: Diagnosis not present

## 2017-06-12 DIAGNOSIS — B958 Unspecified staphylococcus as the cause of diseases classified elsewhere: Secondary | ICD-10-CM | POA: Diagnosis not present

## 2017-06-12 DIAGNOSIS — D509 Iron deficiency anemia, unspecified: Secondary | ICD-10-CM | POA: Diagnosis not present

## 2017-06-12 DIAGNOSIS — N2581 Secondary hyperparathyroidism of renal origin: Secondary | ICD-10-CM | POA: Diagnosis not present

## 2017-06-12 DIAGNOSIS — N186 End stage renal disease: Secondary | ICD-10-CM | POA: Diagnosis not present

## 2017-06-15 DIAGNOSIS — N186 End stage renal disease: Secondary | ICD-10-CM | POA: Diagnosis not present

## 2017-06-15 DIAGNOSIS — N2581 Secondary hyperparathyroidism of renal origin: Secondary | ICD-10-CM | POA: Diagnosis not present

## 2017-06-15 DIAGNOSIS — D631 Anemia in chronic kidney disease: Secondary | ICD-10-CM | POA: Diagnosis not present

## 2017-06-15 DIAGNOSIS — Z5181 Encounter for therapeutic drug level monitoring: Secondary | ICD-10-CM | POA: Diagnosis not present

## 2017-06-15 DIAGNOSIS — B958 Unspecified staphylococcus as the cause of diseases classified elsewhere: Secondary | ICD-10-CM | POA: Diagnosis not present

## 2017-06-15 DIAGNOSIS — D509 Iron deficiency anemia, unspecified: Secondary | ICD-10-CM | POA: Diagnosis not present

## 2017-06-16 DIAGNOSIS — D509 Iron deficiency anemia, unspecified: Secondary | ICD-10-CM | POA: Diagnosis not present

## 2017-06-16 DIAGNOSIS — D631 Anemia in chronic kidney disease: Secondary | ICD-10-CM | POA: Diagnosis not present

## 2017-06-16 DIAGNOSIS — N2581 Secondary hyperparathyroidism of renal origin: Secondary | ICD-10-CM | POA: Diagnosis not present

## 2017-06-16 DIAGNOSIS — N186 End stage renal disease: Secondary | ICD-10-CM | POA: Diagnosis not present

## 2017-06-16 DIAGNOSIS — B958 Unspecified staphylococcus as the cause of diseases classified elsewhere: Secondary | ICD-10-CM | POA: Diagnosis not present

## 2017-06-17 DIAGNOSIS — D509 Iron deficiency anemia, unspecified: Secondary | ICD-10-CM | POA: Diagnosis not present

## 2017-06-17 DIAGNOSIS — N186 End stage renal disease: Secondary | ICD-10-CM | POA: Diagnosis not present

## 2017-06-17 DIAGNOSIS — N2581 Secondary hyperparathyroidism of renal origin: Secondary | ICD-10-CM | POA: Diagnosis not present

## 2017-06-17 DIAGNOSIS — B958 Unspecified staphylococcus as the cause of diseases classified elsewhere: Secondary | ICD-10-CM | POA: Diagnosis not present

## 2017-06-17 DIAGNOSIS — D631 Anemia in chronic kidney disease: Secondary | ICD-10-CM | POA: Diagnosis not present

## 2017-06-19 DIAGNOSIS — N186 End stage renal disease: Secondary | ICD-10-CM | POA: Diagnosis not present

## 2017-06-19 DIAGNOSIS — D631 Anemia in chronic kidney disease: Secondary | ICD-10-CM | POA: Diagnosis not present

## 2017-06-19 DIAGNOSIS — N2581 Secondary hyperparathyroidism of renal origin: Secondary | ICD-10-CM | POA: Diagnosis not present

## 2017-06-19 DIAGNOSIS — D509 Iron deficiency anemia, unspecified: Secondary | ICD-10-CM | POA: Diagnosis not present

## 2017-06-19 DIAGNOSIS — B958 Unspecified staphylococcus as the cause of diseases classified elsewhere: Secondary | ICD-10-CM | POA: Diagnosis not present

## 2017-06-22 DIAGNOSIS — D631 Anemia in chronic kidney disease: Secondary | ICD-10-CM | POA: Diagnosis not present

## 2017-06-22 DIAGNOSIS — D509 Iron deficiency anemia, unspecified: Secondary | ICD-10-CM | POA: Diagnosis not present

## 2017-06-22 DIAGNOSIS — Z992 Dependence on renal dialysis: Secondary | ICD-10-CM | POA: Diagnosis not present

## 2017-06-22 DIAGNOSIS — N186 End stage renal disease: Secondary | ICD-10-CM | POA: Diagnosis not present

## 2017-06-24 DIAGNOSIS — Z992 Dependence on renal dialysis: Secondary | ICD-10-CM | POA: Diagnosis not present

## 2017-06-24 DIAGNOSIS — N186 End stage renal disease: Secondary | ICD-10-CM | POA: Diagnosis not present

## 2017-06-24 DIAGNOSIS — D631 Anemia in chronic kidney disease: Secondary | ICD-10-CM | POA: Diagnosis not present

## 2017-06-24 DIAGNOSIS — D509 Iron deficiency anemia, unspecified: Secondary | ICD-10-CM | POA: Diagnosis not present

## 2017-06-26 DIAGNOSIS — Z992 Dependence on renal dialysis: Secondary | ICD-10-CM | POA: Diagnosis not present

## 2017-06-26 DIAGNOSIS — D509 Iron deficiency anemia, unspecified: Secondary | ICD-10-CM | POA: Diagnosis not present

## 2017-06-26 DIAGNOSIS — N186 End stage renal disease: Secondary | ICD-10-CM | POA: Diagnosis not present

## 2017-06-26 DIAGNOSIS — D631 Anemia in chronic kidney disease: Secondary | ICD-10-CM | POA: Diagnosis not present

## 2017-06-28 DIAGNOSIS — N186 End stage renal disease: Secondary | ICD-10-CM | POA: Diagnosis not present

## 2017-06-29 DIAGNOSIS — N186 End stage renal disease: Secondary | ICD-10-CM | POA: Diagnosis not present

## 2017-06-29 DIAGNOSIS — Z992 Dependence on renal dialysis: Secondary | ICD-10-CM | POA: Diagnosis not present

## 2017-06-29 DIAGNOSIS — D509 Iron deficiency anemia, unspecified: Secondary | ICD-10-CM | POA: Diagnosis not present

## 2017-06-29 DIAGNOSIS — D631 Anemia in chronic kidney disease: Secondary | ICD-10-CM | POA: Diagnosis not present

## 2017-06-30 DIAGNOSIS — I871 Compression of vein: Secondary | ICD-10-CM | POA: Diagnosis not present

## 2017-06-30 DIAGNOSIS — N186 End stage renal disease: Secondary | ICD-10-CM | POA: Diagnosis not present

## 2017-07-01 DIAGNOSIS — Z992 Dependence on renal dialysis: Secondary | ICD-10-CM | POA: Diagnosis not present

## 2017-07-01 DIAGNOSIS — N186 End stage renal disease: Secondary | ICD-10-CM | POA: Diagnosis not present

## 2017-07-01 DIAGNOSIS — D631 Anemia in chronic kidney disease: Secondary | ICD-10-CM | POA: Diagnosis not present

## 2017-07-01 DIAGNOSIS — D509 Iron deficiency anemia, unspecified: Secondary | ICD-10-CM | POA: Diagnosis not present

## 2017-07-02 ENCOUNTER — Encounter: Payer: Medicare Other | Admitting: Vascular Surgery

## 2017-07-03 DIAGNOSIS — D509 Iron deficiency anemia, unspecified: Secondary | ICD-10-CM | POA: Diagnosis not present

## 2017-07-03 DIAGNOSIS — D631 Anemia in chronic kidney disease: Secondary | ICD-10-CM | POA: Diagnosis not present

## 2017-07-03 DIAGNOSIS — Z992 Dependence on renal dialysis: Secondary | ICD-10-CM | POA: Diagnosis not present

## 2017-07-03 DIAGNOSIS — N186 End stage renal disease: Secondary | ICD-10-CM | POA: Diagnosis not present

## 2017-07-05 DIAGNOSIS — N186 End stage renal disease: Secondary | ICD-10-CM | POA: Diagnosis not present

## 2017-07-06 DIAGNOSIS — D631 Anemia in chronic kidney disease: Secondary | ICD-10-CM | POA: Diagnosis not present

## 2017-07-06 DIAGNOSIS — E441 Mild protein-calorie malnutrition: Secondary | ICD-10-CM | POA: Diagnosis present

## 2017-07-06 DIAGNOSIS — D509 Iron deficiency anemia, unspecified: Secondary | ICD-10-CM | POA: Diagnosis present

## 2017-07-06 DIAGNOSIS — I38 Endocarditis, valve unspecified: Secondary | ICD-10-CM | POA: Diagnosis not present

## 2017-07-06 DIAGNOSIS — L97929 Non-pressure chronic ulcer of unspecified part of left lower leg with unspecified severity: Secondary | ICD-10-CM | POA: Diagnosis present

## 2017-07-06 DIAGNOSIS — Z992 Dependence on renal dialysis: Secondary | ICD-10-CM | POA: Diagnosis not present

## 2017-07-06 DIAGNOSIS — Z886 Allergy status to analgesic agent status: Secondary | ICD-10-CM | POA: Diagnosis not present

## 2017-07-06 DIAGNOSIS — E1122 Type 2 diabetes mellitus with diabetic chronic kidney disease: Secondary | ICD-10-CM | POA: Diagnosis present

## 2017-07-06 DIAGNOSIS — I120 Hypertensive chronic kidney disease with stage 5 chronic kidney disease or end stage renal disease: Secondary | ICD-10-CM | POA: Diagnosis present

## 2017-07-06 DIAGNOSIS — I10 Essential (primary) hypertension: Secondary | ICD-10-CM | POA: Diagnosis not present

## 2017-07-06 DIAGNOSIS — I872 Venous insufficiency (chronic) (peripheral): Secondary | ICD-10-CM | POA: Diagnosis not present

## 2017-07-06 DIAGNOSIS — E11622 Type 2 diabetes mellitus with other skin ulcer: Secondary | ICD-10-CM | POA: Diagnosis present

## 2017-07-06 DIAGNOSIS — G894 Chronic pain syndrome: Secondary | ICD-10-CM | POA: Diagnosis present

## 2017-07-06 DIAGNOSIS — T82868A Thrombosis of vascular prosthetic devices, implants and grafts, initial encounter: Secondary | ICD-10-CM | POA: Diagnosis present

## 2017-07-06 DIAGNOSIS — E8770 Fluid overload, unspecified: Secondary | ICD-10-CM | POA: Diagnosis not present

## 2017-07-06 DIAGNOSIS — I82503 Chronic embolism and thrombosis of unspecified deep veins of lower extremity, bilateral: Secondary | ICD-10-CM | POA: Diagnosis not present

## 2017-07-06 DIAGNOSIS — T827XXA Infection and inflammatory reaction due to other cardiac and vascular devices, implants and grafts, initial encounter: Secondary | ICD-10-CM | POA: Diagnosis present

## 2017-07-06 DIAGNOSIS — Z6841 Body Mass Index (BMI) 40.0 and over, adult: Secondary | ICD-10-CM | POA: Diagnosis not present

## 2017-07-06 DIAGNOSIS — M1990 Unspecified osteoarthritis, unspecified site: Secondary | ICD-10-CM | POA: Diagnosis present

## 2017-07-06 DIAGNOSIS — E1169 Type 2 diabetes mellitus with other specified complication: Secondary | ICD-10-CM | POA: Diagnosis not present

## 2017-07-06 DIAGNOSIS — I878 Other specified disorders of veins: Secondary | ICD-10-CM | POA: Diagnosis not present

## 2017-07-06 DIAGNOSIS — B958 Unspecified staphylococcus as the cause of diseases classified elsewhere: Secondary | ICD-10-CM | POA: Diagnosis not present

## 2017-07-06 DIAGNOSIS — K219 Gastro-esophageal reflux disease without esophagitis: Secondary | ICD-10-CM | POA: Diagnosis present

## 2017-07-06 DIAGNOSIS — E119 Type 2 diabetes mellitus without complications: Secondary | ICD-10-CM | POA: Diagnosis not present

## 2017-07-06 DIAGNOSIS — M79605 Pain in left leg: Secondary | ICD-10-CM | POA: Diagnosis not present

## 2017-07-06 DIAGNOSIS — Z794 Long term (current) use of insulin: Secondary | ICD-10-CM | POA: Diagnosis not present

## 2017-07-06 DIAGNOSIS — R509 Fever, unspecified: Secondary | ICD-10-CM | POA: Diagnosis not present

## 2017-07-06 DIAGNOSIS — M109 Gout, unspecified: Secondary | ICD-10-CM | POA: Diagnosis present

## 2017-07-06 DIAGNOSIS — R0600 Dyspnea, unspecified: Secondary | ICD-10-CM | POA: Diagnosis not present

## 2017-07-06 DIAGNOSIS — I330 Acute and subacute infective endocarditis: Secondary | ICD-10-CM | POA: Diagnosis not present

## 2017-07-06 DIAGNOSIS — N186 End stage renal disease: Secondary | ICD-10-CM | POA: Diagnosis not present

## 2017-07-06 DIAGNOSIS — G4730 Sleep apnea, unspecified: Secondary | ICD-10-CM | POA: Diagnosis not present

## 2017-07-06 DIAGNOSIS — N2581 Secondary hyperparathyroidism of renal origin: Secondary | ICD-10-CM | POA: Diagnosis present

## 2017-07-06 DIAGNOSIS — T8249XA Other complication of vascular dialysis catheter, initial encounter: Secondary | ICD-10-CM | POA: Diagnosis not present

## 2017-07-06 DIAGNOSIS — G4733 Obstructive sleep apnea (adult) (pediatric): Secondary | ICD-10-CM | POA: Diagnosis present

## 2017-07-06 DIAGNOSIS — E213 Hyperparathyroidism, unspecified: Secondary | ICD-10-CM | POA: Diagnosis not present

## 2017-07-06 DIAGNOSIS — Z7901 Long term (current) use of anticoagulants: Secondary | ICD-10-CM | POA: Diagnosis not present

## 2017-07-06 DIAGNOSIS — L03116 Cellulitis of left lower limb: Secondary | ICD-10-CM | POA: Diagnosis not present

## 2017-07-06 DIAGNOSIS — R7881 Bacteremia: Secondary | ICD-10-CM | POA: Diagnosis not present

## 2017-07-06 DIAGNOSIS — Z833 Family history of diabetes mellitus: Secondary | ICD-10-CM | POA: Diagnosis not present

## 2017-07-06 DIAGNOSIS — M7989 Other specified soft tissue disorders: Secondary | ICD-10-CM | POA: Diagnosis not present

## 2017-07-07 ENCOUNTER — Encounter: Payer: Medicare Other | Admitting: Vascular Surgery

## 2017-07-27 DIAGNOSIS — Z794 Long term (current) use of insulin: Secondary | ICD-10-CM | POA: Diagnosis not present

## 2017-07-27 DIAGNOSIS — Z86718 Personal history of other venous thrombosis and embolism: Secondary | ICD-10-CM | POA: Diagnosis not present

## 2017-07-27 DIAGNOSIS — T82868D Thrombosis of vascular prosthetic devices, implants and grafts, subsequent encounter: Secondary | ICD-10-CM | POA: Diagnosis not present

## 2017-07-27 DIAGNOSIS — E1122 Type 2 diabetes mellitus with diabetic chronic kidney disease: Secondary | ICD-10-CM | POA: Diagnosis not present

## 2017-07-27 DIAGNOSIS — Z7901 Long term (current) use of anticoagulants: Secondary | ICD-10-CM | POA: Diagnosis not present

## 2017-07-27 DIAGNOSIS — Z6841 Body Mass Index (BMI) 40.0 and over, adult: Secondary | ICD-10-CM | POA: Diagnosis not present

## 2017-07-27 DIAGNOSIS — D631 Anemia in chronic kidney disease: Secondary | ICD-10-CM | POA: Diagnosis not present

## 2017-07-27 DIAGNOSIS — I120 Hypertensive chronic kidney disease with stage 5 chronic kidney disease or end stage renal disease: Secondary | ICD-10-CM | POA: Diagnosis not present

## 2017-07-27 DIAGNOSIS — I872 Venous insufficiency (chronic) (peripheral): Secondary | ICD-10-CM | POA: Diagnosis not present

## 2017-07-27 DIAGNOSIS — M150 Primary generalized (osteo)arthritis: Secondary | ICD-10-CM | POA: Diagnosis not present

## 2017-07-27 DIAGNOSIS — N186 End stage renal disease: Secondary | ICD-10-CM | POA: Diagnosis not present

## 2017-07-27 DIAGNOSIS — E1142 Type 2 diabetes mellitus with diabetic polyneuropathy: Secondary | ICD-10-CM | POA: Diagnosis not present

## 2017-07-28 DIAGNOSIS — E1129 Type 2 diabetes mellitus with other diabetic kidney complication: Secondary | ICD-10-CM | POA: Diagnosis not present

## 2017-07-28 DIAGNOSIS — Z992 Dependence on renal dialysis: Secondary | ICD-10-CM | POA: Diagnosis not present

## 2017-07-28 DIAGNOSIS — E213 Hyperparathyroidism, unspecified: Secondary | ICD-10-CM | POA: Diagnosis not present

## 2017-07-28 DIAGNOSIS — D631 Anemia in chronic kidney disease: Secondary | ICD-10-CM | POA: Diagnosis not present

## 2017-07-28 DIAGNOSIS — D509 Iron deficiency anemia, unspecified: Secondary | ICD-10-CM | POA: Diagnosis not present

## 2017-07-28 DIAGNOSIS — N186 End stage renal disease: Secondary | ICD-10-CM | POA: Diagnosis not present

## 2017-07-29 DIAGNOSIS — E1142 Type 2 diabetes mellitus with diabetic polyneuropathy: Secondary | ICD-10-CM | POA: Diagnosis not present

## 2017-07-29 DIAGNOSIS — I872 Venous insufficiency (chronic) (peripheral): Secondary | ICD-10-CM | POA: Diagnosis not present

## 2017-07-29 DIAGNOSIS — I120 Hypertensive chronic kidney disease with stage 5 chronic kidney disease or end stage renal disease: Secondary | ICD-10-CM | POA: Diagnosis not present

## 2017-07-29 DIAGNOSIS — E1122 Type 2 diabetes mellitus with diabetic chronic kidney disease: Secondary | ICD-10-CM | POA: Diagnosis not present

## 2017-07-29 DIAGNOSIS — N186 End stage renal disease: Secondary | ICD-10-CM | POA: Diagnosis not present

## 2017-07-29 DIAGNOSIS — T82868D Thrombosis of vascular prosthetic devices, implants and grafts, subsequent encounter: Secondary | ICD-10-CM | POA: Diagnosis not present

## 2017-07-30 DIAGNOSIS — D509 Iron deficiency anemia, unspecified: Secondary | ICD-10-CM | POA: Diagnosis not present

## 2017-07-30 DIAGNOSIS — N186 End stage renal disease: Secondary | ICD-10-CM | POA: Diagnosis not present

## 2017-07-30 DIAGNOSIS — E213 Hyperparathyroidism, unspecified: Secondary | ICD-10-CM | POA: Diagnosis not present

## 2017-07-30 DIAGNOSIS — Z992 Dependence on renal dialysis: Secondary | ICD-10-CM | POA: Diagnosis not present

## 2017-07-30 DIAGNOSIS — D631 Anemia in chronic kidney disease: Secondary | ICD-10-CM | POA: Diagnosis not present

## 2017-08-02 DIAGNOSIS — E213 Hyperparathyroidism, unspecified: Secondary | ICD-10-CM | POA: Diagnosis not present

## 2017-08-02 DIAGNOSIS — N186 End stage renal disease: Secondary | ICD-10-CM | POA: Diagnosis not present

## 2017-08-02 DIAGNOSIS — Z992 Dependence on renal dialysis: Secondary | ICD-10-CM | POA: Diagnosis not present

## 2017-08-02 DIAGNOSIS — D509 Iron deficiency anemia, unspecified: Secondary | ICD-10-CM | POA: Diagnosis not present

## 2017-08-02 DIAGNOSIS — D631 Anemia in chronic kidney disease: Secondary | ICD-10-CM | POA: Diagnosis not present

## 2017-08-04 DIAGNOSIS — I120 Hypertensive chronic kidney disease with stage 5 chronic kidney disease or end stage renal disease: Secondary | ICD-10-CM | POA: Diagnosis not present

## 2017-08-04 DIAGNOSIS — D509 Iron deficiency anemia, unspecified: Secondary | ICD-10-CM | POA: Diagnosis not present

## 2017-08-04 DIAGNOSIS — E1142 Type 2 diabetes mellitus with diabetic polyneuropathy: Secondary | ICD-10-CM | POA: Diagnosis not present

## 2017-08-04 DIAGNOSIS — E1122 Type 2 diabetes mellitus with diabetic chronic kidney disease: Secondary | ICD-10-CM | POA: Diagnosis not present

## 2017-08-04 DIAGNOSIS — N186 End stage renal disease: Secondary | ICD-10-CM | POA: Diagnosis not present

## 2017-08-04 DIAGNOSIS — Z992 Dependence on renal dialysis: Secondary | ICD-10-CM | POA: Diagnosis not present

## 2017-08-04 DIAGNOSIS — E213 Hyperparathyroidism, unspecified: Secondary | ICD-10-CM | POA: Diagnosis not present

## 2017-08-04 DIAGNOSIS — T82868D Thrombosis of vascular prosthetic devices, implants and grafts, subsequent encounter: Secondary | ICD-10-CM | POA: Diagnosis not present

## 2017-08-04 DIAGNOSIS — I872 Venous insufficiency (chronic) (peripheral): Secondary | ICD-10-CM | POA: Diagnosis not present

## 2017-08-04 DIAGNOSIS — D631 Anemia in chronic kidney disease: Secondary | ICD-10-CM | POA: Diagnosis not present

## 2017-08-05 DIAGNOSIS — N186 End stage renal disease: Secondary | ICD-10-CM | POA: Diagnosis not present

## 2017-08-06 DIAGNOSIS — E213 Hyperparathyroidism, unspecified: Secondary | ICD-10-CM | POA: Diagnosis not present

## 2017-08-06 DIAGNOSIS — D631 Anemia in chronic kidney disease: Secondary | ICD-10-CM | POA: Diagnosis not present

## 2017-08-06 DIAGNOSIS — Z992 Dependence on renal dialysis: Secondary | ICD-10-CM | POA: Diagnosis not present

## 2017-08-06 DIAGNOSIS — N186 End stage renal disease: Secondary | ICD-10-CM | POA: Diagnosis not present

## 2017-08-06 DIAGNOSIS — D509 Iron deficiency anemia, unspecified: Secondary | ICD-10-CM | POA: Diagnosis not present

## 2017-08-09 DIAGNOSIS — D631 Anemia in chronic kidney disease: Secondary | ICD-10-CM | POA: Diagnosis not present

## 2017-08-09 DIAGNOSIS — R2689 Other abnormalities of gait and mobility: Secondary | ICD-10-CM | POA: Diagnosis not present

## 2017-08-09 DIAGNOSIS — E213 Hyperparathyroidism, unspecified: Secondary | ICD-10-CM | POA: Diagnosis not present

## 2017-08-09 DIAGNOSIS — N186 End stage renal disease: Secondary | ICD-10-CM | POA: Diagnosis not present

## 2017-08-09 DIAGNOSIS — D509 Iron deficiency anemia, unspecified: Secondary | ICD-10-CM | POA: Diagnosis not present

## 2017-08-09 DIAGNOSIS — Z992 Dependence on renal dialysis: Secondary | ICD-10-CM | POA: Diagnosis not present

## 2017-08-09 DIAGNOSIS — M6281 Muscle weakness (generalized): Secondary | ICD-10-CM | POA: Diagnosis not present

## 2017-08-10 DIAGNOSIS — E1142 Type 2 diabetes mellitus with diabetic polyneuropathy: Secondary | ICD-10-CM | POA: Diagnosis not present

## 2017-08-10 DIAGNOSIS — I120 Hypertensive chronic kidney disease with stage 5 chronic kidney disease or end stage renal disease: Secondary | ICD-10-CM | POA: Diagnosis not present

## 2017-08-10 DIAGNOSIS — N186 End stage renal disease: Secondary | ICD-10-CM | POA: Diagnosis not present

## 2017-08-10 DIAGNOSIS — E1122 Type 2 diabetes mellitus with diabetic chronic kidney disease: Secondary | ICD-10-CM | POA: Diagnosis not present

## 2017-08-10 DIAGNOSIS — I872 Venous insufficiency (chronic) (peripheral): Secondary | ICD-10-CM | POA: Diagnosis not present

## 2017-08-10 DIAGNOSIS — T82868D Thrombosis of vascular prosthetic devices, implants and grafts, subsequent encounter: Secondary | ICD-10-CM | POA: Diagnosis not present

## 2017-08-11 DIAGNOSIS — Z992 Dependence on renal dialysis: Secondary | ICD-10-CM | POA: Diagnosis not present

## 2017-08-11 DIAGNOSIS — R270 Ataxia, unspecified: Secondary | ICD-10-CM | POA: Diagnosis not present

## 2017-08-11 DIAGNOSIS — N186 End stage renal disease: Secondary | ICD-10-CM | POA: Diagnosis not present

## 2017-08-11 DIAGNOSIS — D509 Iron deficiency anemia, unspecified: Secondary | ICD-10-CM | POA: Diagnosis not present

## 2017-08-11 DIAGNOSIS — D631 Anemia in chronic kidney disease: Secondary | ICD-10-CM | POA: Diagnosis not present

## 2017-08-11 DIAGNOSIS — E213 Hyperparathyroidism, unspecified: Secondary | ICD-10-CM | POA: Diagnosis not present

## 2017-08-12 DIAGNOSIS — N186 End stage renal disease: Secondary | ICD-10-CM | POA: Diagnosis not present

## 2017-08-12 DIAGNOSIS — I120 Hypertensive chronic kidney disease with stage 5 chronic kidney disease or end stage renal disease: Secondary | ICD-10-CM | POA: Diagnosis not present

## 2017-08-12 DIAGNOSIS — T82868D Thrombosis of vascular prosthetic devices, implants and grafts, subsequent encounter: Secondary | ICD-10-CM | POA: Diagnosis not present

## 2017-08-12 DIAGNOSIS — I872 Venous insufficiency (chronic) (peripheral): Secondary | ICD-10-CM | POA: Diagnosis not present

## 2017-08-12 DIAGNOSIS — E1122 Type 2 diabetes mellitus with diabetic chronic kidney disease: Secondary | ICD-10-CM | POA: Diagnosis not present

## 2017-08-12 DIAGNOSIS — E1142 Type 2 diabetes mellitus with diabetic polyneuropathy: Secondary | ICD-10-CM | POA: Diagnosis not present

## 2017-08-13 DIAGNOSIS — Z992 Dependence on renal dialysis: Secondary | ICD-10-CM | POA: Diagnosis not present

## 2017-08-13 DIAGNOSIS — D631 Anemia in chronic kidney disease: Secondary | ICD-10-CM | POA: Diagnosis not present

## 2017-08-13 DIAGNOSIS — N186 End stage renal disease: Secondary | ICD-10-CM | POA: Diagnosis not present

## 2017-08-13 DIAGNOSIS — R270 Ataxia, unspecified: Secondary | ICD-10-CM | POA: Diagnosis not present

## 2017-08-13 DIAGNOSIS — D509 Iron deficiency anemia, unspecified: Secondary | ICD-10-CM | POA: Diagnosis not present

## 2017-08-13 DIAGNOSIS — E213 Hyperparathyroidism, unspecified: Secondary | ICD-10-CM | POA: Diagnosis not present

## 2017-08-16 DIAGNOSIS — D631 Anemia in chronic kidney disease: Secondary | ICD-10-CM | POA: Diagnosis not present

## 2017-08-16 DIAGNOSIS — E213 Hyperparathyroidism, unspecified: Secondary | ICD-10-CM | POA: Diagnosis not present

## 2017-08-16 DIAGNOSIS — N186 End stage renal disease: Secondary | ICD-10-CM | POA: Diagnosis not present

## 2017-08-16 DIAGNOSIS — Z992 Dependence on renal dialysis: Secondary | ICD-10-CM | POA: Diagnosis not present

## 2017-08-16 DIAGNOSIS — R270 Ataxia, unspecified: Secondary | ICD-10-CM | POA: Diagnosis not present

## 2017-08-16 DIAGNOSIS — L89899 Pressure ulcer of other site, unspecified stage: Secondary | ICD-10-CM | POA: Diagnosis not present

## 2017-08-16 DIAGNOSIS — D509 Iron deficiency anemia, unspecified: Secondary | ICD-10-CM | POA: Diagnosis not present

## 2017-08-18 DIAGNOSIS — D509 Iron deficiency anemia, unspecified: Secondary | ICD-10-CM | POA: Diagnosis not present

## 2017-08-18 DIAGNOSIS — Z992 Dependence on renal dialysis: Secondary | ICD-10-CM | POA: Diagnosis not present

## 2017-08-18 DIAGNOSIS — D631 Anemia in chronic kidney disease: Secondary | ICD-10-CM | POA: Diagnosis not present

## 2017-08-18 DIAGNOSIS — N186 End stage renal disease: Secondary | ICD-10-CM | POA: Diagnosis not present

## 2017-08-18 DIAGNOSIS — E213 Hyperparathyroidism, unspecified: Secondary | ICD-10-CM | POA: Diagnosis not present

## 2017-08-18 DIAGNOSIS — R270 Ataxia, unspecified: Secondary | ICD-10-CM | POA: Diagnosis not present

## 2017-08-19 DIAGNOSIS — D509 Iron deficiency anemia, unspecified: Secondary | ICD-10-CM | POA: Diagnosis not present

## 2017-08-19 DIAGNOSIS — R270 Ataxia, unspecified: Secondary | ICD-10-CM | POA: Diagnosis not present

## 2017-08-19 DIAGNOSIS — D649 Anemia, unspecified: Secondary | ICD-10-CM | POA: Diagnosis not present

## 2017-08-19 DIAGNOSIS — N186 End stage renal disease: Secondary | ICD-10-CM | POA: Diagnosis not present

## 2017-08-20 DIAGNOSIS — E213 Hyperparathyroidism, unspecified: Secondary | ICD-10-CM | POA: Diagnosis not present

## 2017-08-20 DIAGNOSIS — R270 Ataxia, unspecified: Secondary | ICD-10-CM | POA: Diagnosis not present

## 2017-08-20 DIAGNOSIS — N186 End stage renal disease: Secondary | ICD-10-CM | POA: Diagnosis not present

## 2017-08-20 DIAGNOSIS — D509 Iron deficiency anemia, unspecified: Secondary | ICD-10-CM | POA: Diagnosis not present

## 2017-08-20 DIAGNOSIS — D631 Anemia in chronic kidney disease: Secondary | ICD-10-CM | POA: Diagnosis not present

## 2017-08-20 DIAGNOSIS — Z992 Dependence on renal dialysis: Secondary | ICD-10-CM | POA: Diagnosis not present

## 2017-08-22 DIAGNOSIS — D509 Iron deficiency anemia, unspecified: Secondary | ICD-10-CM | POA: Diagnosis not present

## 2017-08-22 DIAGNOSIS — Z992 Dependence on renal dialysis: Secondary | ICD-10-CM | POA: Diagnosis not present

## 2017-08-22 DIAGNOSIS — R270 Ataxia, unspecified: Secondary | ICD-10-CM | POA: Diagnosis not present

## 2017-08-22 DIAGNOSIS — D631 Anemia in chronic kidney disease: Secondary | ICD-10-CM | POA: Diagnosis not present

## 2017-08-22 DIAGNOSIS — E213 Hyperparathyroidism, unspecified: Secondary | ICD-10-CM | POA: Diagnosis not present

## 2017-08-22 DIAGNOSIS — N186 End stage renal disease: Secondary | ICD-10-CM | POA: Diagnosis not present

## 2017-08-24 DIAGNOSIS — N250 Renal osteodystrophy: Secondary | ICD-10-CM | POA: Diagnosis present

## 2017-08-24 DIAGNOSIS — R0602 Shortness of breath: Secondary | ICD-10-CM | POA: Diagnosis not present

## 2017-08-24 DIAGNOSIS — R270 Ataxia, unspecified: Secondary | ICD-10-CM | POA: Diagnosis not present

## 2017-08-24 DIAGNOSIS — Z992 Dependence on renal dialysis: Secondary | ICD-10-CM | POA: Diagnosis not present

## 2017-08-24 DIAGNOSIS — R0902 Hypoxemia: Secondary | ICD-10-CM | POA: Diagnosis not present

## 2017-08-24 DIAGNOSIS — D631 Anemia in chronic kidney disease: Secondary | ICD-10-CM | POA: Diagnosis not present

## 2017-08-24 DIAGNOSIS — G4730 Sleep apnea, unspecified: Secondary | ICD-10-CM | POA: Diagnosis present

## 2017-08-24 DIAGNOSIS — T82868A Thrombosis of vascular prosthetic devices, implants and grafts, initial encounter: Secondary | ICD-10-CM | POA: Diagnosis not present

## 2017-08-24 DIAGNOSIS — D649 Anemia, unspecified: Secondary | ICD-10-CM | POA: Diagnosis not present

## 2017-08-24 DIAGNOSIS — Z8249 Family history of ischemic heart disease and other diseases of the circulatory system: Secondary | ICD-10-CM | POA: Diagnosis not present

## 2017-08-24 DIAGNOSIS — E785 Hyperlipidemia, unspecified: Secondary | ICD-10-CM | POA: Diagnosis present

## 2017-08-24 DIAGNOSIS — D509 Iron deficiency anemia, unspecified: Secondary | ICD-10-CM | POA: Diagnosis not present

## 2017-08-24 DIAGNOSIS — Z888 Allergy status to other drugs, medicaments and biological substances status: Secondary | ICD-10-CM | POA: Diagnosis not present

## 2017-08-24 DIAGNOSIS — T8249XA Other complication of vascular dialysis catheter, initial encounter: Secondary | ICD-10-CM | POA: Diagnosis not present

## 2017-08-24 DIAGNOSIS — E871 Hypo-osmolality and hyponatremia: Secondary | ICD-10-CM | POA: Diagnosis not present

## 2017-08-24 DIAGNOSIS — N186 End stage renal disease: Secondary | ICD-10-CM | POA: Diagnosis not present

## 2017-08-24 DIAGNOSIS — E213 Hyperparathyroidism, unspecified: Secondary | ICD-10-CM | POA: Diagnosis not present

## 2017-08-24 DIAGNOSIS — Z743 Need for continuous supervision: Secondary | ICD-10-CM | POA: Diagnosis not present

## 2017-08-24 DIAGNOSIS — R4182 Altered mental status, unspecified: Secondary | ICD-10-CM | POA: Diagnosis not present

## 2017-08-24 DIAGNOSIS — I120 Hypertensive chronic kidney disease with stage 5 chronic kidney disease or end stage renal disease: Secondary | ICD-10-CM | POA: Diagnosis present

## 2017-08-27 DIAGNOSIS — M6281 Muscle weakness (generalized): Secondary | ICD-10-CM | POA: Diagnosis not present

## 2017-08-27 DIAGNOSIS — R0602 Shortness of breath: Secondary | ICD-10-CM | POA: Diagnosis not present

## 2017-08-27 DIAGNOSIS — N186 End stage renal disease: Secondary | ICD-10-CM | POA: Diagnosis not present

## 2017-08-27 DIAGNOSIS — D509 Iron deficiency anemia, unspecified: Secondary | ICD-10-CM | POA: Diagnosis not present

## 2017-08-27 DIAGNOSIS — D631 Anemia in chronic kidney disease: Secondary | ICD-10-CM | POA: Diagnosis not present

## 2017-08-27 DIAGNOSIS — I120 Hypertensive chronic kidney disease with stage 5 chronic kidney disease or end stage renal disease: Secondary | ICD-10-CM | POA: Diagnosis not present

## 2017-08-27 DIAGNOSIS — Z992 Dependence on renal dialysis: Secondary | ICD-10-CM | POA: Diagnosis not present

## 2017-08-27 DIAGNOSIS — R270 Ataxia, unspecified: Secondary | ICD-10-CM | POA: Diagnosis not present

## 2017-08-27 DIAGNOSIS — E213 Hyperparathyroidism, unspecified: Secondary | ICD-10-CM | POA: Diagnosis not present

## 2017-08-27 DIAGNOSIS — T82868A Thrombosis of vascular prosthetic devices, implants and grafts, initial encounter: Secondary | ICD-10-CM | POA: Diagnosis not present

## 2017-08-30 DIAGNOSIS — E213 Hyperparathyroidism, unspecified: Secondary | ICD-10-CM | POA: Diagnosis not present

## 2017-08-30 DIAGNOSIS — N186 End stage renal disease: Secondary | ICD-10-CM | POA: Diagnosis not present

## 2017-08-30 DIAGNOSIS — D631 Anemia in chronic kidney disease: Secondary | ICD-10-CM | POA: Diagnosis not present

## 2017-08-30 DIAGNOSIS — R270 Ataxia, unspecified: Secondary | ICD-10-CM | POA: Diagnosis not present

## 2017-08-30 DIAGNOSIS — D509 Iron deficiency anemia, unspecified: Secondary | ICD-10-CM | POA: Diagnosis not present

## 2017-08-30 DIAGNOSIS — Z992 Dependence on renal dialysis: Secondary | ICD-10-CM | POA: Diagnosis not present

## 2017-08-30 DIAGNOSIS — R609 Edema, unspecified: Secondary | ICD-10-CM | POA: Diagnosis not present

## 2017-09-01 DIAGNOSIS — I129 Hypertensive chronic kidney disease with stage 1 through stage 4 chronic kidney disease, or unspecified chronic kidney disease: Secondary | ICD-10-CM | POA: Diagnosis not present

## 2017-09-01 DIAGNOSIS — D631 Anemia in chronic kidney disease: Secondary | ICD-10-CM | POA: Diagnosis not present

## 2017-09-01 DIAGNOSIS — E662 Morbid (severe) obesity with alveolar hypoventilation: Secondary | ICD-10-CM | POA: Diagnosis not present

## 2017-09-01 DIAGNOSIS — R270 Ataxia, unspecified: Secondary | ICD-10-CM | POA: Diagnosis not present

## 2017-09-01 DIAGNOSIS — E213 Hyperparathyroidism, unspecified: Secondary | ICD-10-CM | POA: Diagnosis not present

## 2017-09-01 DIAGNOSIS — D509 Iron deficiency anemia, unspecified: Secondary | ICD-10-CM | POA: Diagnosis not present

## 2017-09-01 DIAGNOSIS — M6281 Muscle weakness (generalized): Secondary | ICD-10-CM | POA: Diagnosis not present

## 2017-09-01 DIAGNOSIS — R2681 Unsteadiness on feet: Secondary | ICD-10-CM | POA: Diagnosis not present

## 2017-09-01 DIAGNOSIS — E119 Type 2 diabetes mellitus without complications: Secondary | ICD-10-CM | POA: Diagnosis not present

## 2017-09-01 DIAGNOSIS — M25562 Pain in left knee: Secondary | ICD-10-CM | POA: Diagnosis not present

## 2017-09-01 DIAGNOSIS — N186 End stage renal disease: Secondary | ICD-10-CM | POA: Diagnosis not present

## 2017-09-01 DIAGNOSIS — E782 Mixed hyperlipidemia: Secondary | ICD-10-CM | POA: Diagnosis not present

## 2017-09-01 DIAGNOSIS — M1A39X0 Chronic gout due to renal impairment, multiple sites, without tophus (tophi): Secondary | ICD-10-CM | POA: Diagnosis not present

## 2017-09-01 DIAGNOSIS — E8770 Fluid overload, unspecified: Secondary | ICD-10-CM | POA: Diagnosis not present

## 2017-09-01 DIAGNOSIS — M25551 Pain in right hip: Secondary | ICD-10-CM | POA: Diagnosis not present

## 2017-09-01 DIAGNOSIS — Z992 Dependence on renal dialysis: Secondary | ICD-10-CM | POA: Diagnosis not present

## 2017-09-03 DIAGNOSIS — N186 End stage renal disease: Secondary | ICD-10-CM | POA: Diagnosis not present

## 2017-09-03 DIAGNOSIS — R270 Ataxia, unspecified: Secondary | ICD-10-CM | POA: Diagnosis not present

## 2017-09-03 DIAGNOSIS — Z992 Dependence on renal dialysis: Secondary | ICD-10-CM | POA: Diagnosis not present

## 2017-09-03 DIAGNOSIS — D509 Iron deficiency anemia, unspecified: Secondary | ICD-10-CM | POA: Diagnosis not present

## 2017-09-03 DIAGNOSIS — E213 Hyperparathyroidism, unspecified: Secondary | ICD-10-CM | POA: Diagnosis not present

## 2017-09-03 DIAGNOSIS — D631 Anemia in chronic kidney disease: Secondary | ICD-10-CM | POA: Diagnosis not present

## 2017-09-04 DIAGNOSIS — N186 End stage renal disease: Secondary | ICD-10-CM | POA: Diagnosis not present

## 2017-09-06 DIAGNOSIS — R270 Ataxia, unspecified: Secondary | ICD-10-CM | POA: Diagnosis not present

## 2017-09-06 DIAGNOSIS — Z7901 Long term (current) use of anticoagulants: Secondary | ICD-10-CM | POA: Diagnosis not present

## 2017-09-06 DIAGNOSIS — E213 Hyperparathyroidism, unspecified: Secondary | ICD-10-CM | POA: Diagnosis not present

## 2017-09-06 DIAGNOSIS — D631 Anemia in chronic kidney disease: Secondary | ICD-10-CM | POA: Diagnosis not present

## 2017-09-06 DIAGNOSIS — Z992 Dependence on renal dialysis: Secondary | ICD-10-CM | POA: Diagnosis not present

## 2017-09-06 DIAGNOSIS — D509 Iron deficiency anemia, unspecified: Secondary | ICD-10-CM | POA: Diagnosis not present

## 2017-09-06 DIAGNOSIS — N186 End stage renal disease: Secondary | ICD-10-CM | POA: Diagnosis not present

## 2017-09-08 DIAGNOSIS — E213 Hyperparathyroidism, unspecified: Secondary | ICD-10-CM | POA: Diagnosis not present

## 2017-09-08 DIAGNOSIS — N186 End stage renal disease: Secondary | ICD-10-CM | POA: Diagnosis not present

## 2017-09-08 DIAGNOSIS — D649 Anemia, unspecified: Secondary | ICD-10-CM | POA: Diagnosis not present

## 2017-09-08 DIAGNOSIS — D631 Anemia in chronic kidney disease: Secondary | ICD-10-CM | POA: Diagnosis not present

## 2017-09-08 DIAGNOSIS — R270 Ataxia, unspecified: Secondary | ICD-10-CM | POA: Diagnosis not present

## 2017-09-08 DIAGNOSIS — Z992 Dependence on renal dialysis: Secondary | ICD-10-CM | POA: Diagnosis not present

## 2017-09-08 DIAGNOSIS — D509 Iron deficiency anemia, unspecified: Secondary | ICD-10-CM | POA: Diagnosis not present

## 2017-09-09 DIAGNOSIS — R270 Ataxia, unspecified: Secondary | ICD-10-CM | POA: Diagnosis not present

## 2017-09-09 DIAGNOSIS — N186 End stage renal disease: Secondary | ICD-10-CM | POA: Diagnosis not present

## 2017-09-09 DIAGNOSIS — D649 Anemia, unspecified: Secondary | ICD-10-CM | POA: Diagnosis not present

## 2017-09-10 DIAGNOSIS — Z992 Dependence on renal dialysis: Secondary | ICD-10-CM | POA: Diagnosis not present

## 2017-09-10 DIAGNOSIS — R270 Ataxia, unspecified: Secondary | ICD-10-CM | POA: Diagnosis not present

## 2017-09-10 DIAGNOSIS — D509 Iron deficiency anemia, unspecified: Secondary | ICD-10-CM | POA: Diagnosis not present

## 2017-09-10 DIAGNOSIS — D631 Anemia in chronic kidney disease: Secondary | ICD-10-CM | POA: Diagnosis not present

## 2017-09-10 DIAGNOSIS — E213 Hyperparathyroidism, unspecified: Secondary | ICD-10-CM | POA: Diagnosis not present

## 2017-09-10 DIAGNOSIS — N186 End stage renal disease: Secondary | ICD-10-CM | POA: Diagnosis not present

## 2017-09-13 DIAGNOSIS — D509 Iron deficiency anemia, unspecified: Secondary | ICD-10-CM | POA: Diagnosis not present

## 2017-09-13 DIAGNOSIS — D631 Anemia in chronic kidney disease: Secondary | ICD-10-CM | POA: Diagnosis not present

## 2017-09-13 DIAGNOSIS — R270 Ataxia, unspecified: Secondary | ICD-10-CM | POA: Diagnosis not present

## 2017-09-13 DIAGNOSIS — E213 Hyperparathyroidism, unspecified: Secondary | ICD-10-CM | POA: Diagnosis not present

## 2017-09-13 DIAGNOSIS — Z452 Encounter for adjustment and management of vascular access device: Secondary | ICD-10-CM | POA: Diagnosis not present

## 2017-09-13 DIAGNOSIS — N186 End stage renal disease: Secondary | ICD-10-CM | POA: Diagnosis not present

## 2017-09-13 DIAGNOSIS — Z7901 Long term (current) use of anticoagulants: Secondary | ICD-10-CM | POA: Diagnosis not present

## 2017-09-13 DIAGNOSIS — Z992 Dependence on renal dialysis: Secondary | ICD-10-CM | POA: Diagnosis not present

## 2017-09-15 DIAGNOSIS — D509 Iron deficiency anemia, unspecified: Secondary | ICD-10-CM | POA: Diagnosis not present

## 2017-09-15 DIAGNOSIS — R270 Ataxia, unspecified: Secondary | ICD-10-CM | POA: Diagnosis not present

## 2017-09-15 DIAGNOSIS — E213 Hyperparathyroidism, unspecified: Secondary | ICD-10-CM | POA: Diagnosis not present

## 2017-09-15 DIAGNOSIS — Z992 Dependence on renal dialysis: Secondary | ICD-10-CM | POA: Diagnosis not present

## 2017-09-15 DIAGNOSIS — N186 End stage renal disease: Secondary | ICD-10-CM | POA: Diagnosis not present

## 2017-09-15 DIAGNOSIS — D631 Anemia in chronic kidney disease: Secondary | ICD-10-CM | POA: Diagnosis not present

## 2017-09-17 DIAGNOSIS — D509 Iron deficiency anemia, unspecified: Secondary | ICD-10-CM | POA: Diagnosis not present

## 2017-09-17 DIAGNOSIS — D631 Anemia in chronic kidney disease: Secondary | ICD-10-CM | POA: Diagnosis not present

## 2017-09-17 DIAGNOSIS — Z992 Dependence on renal dialysis: Secondary | ICD-10-CM | POA: Diagnosis not present

## 2017-09-17 DIAGNOSIS — N186 End stage renal disease: Secondary | ICD-10-CM | POA: Diagnosis not present

## 2017-09-17 DIAGNOSIS — R270 Ataxia, unspecified: Secondary | ICD-10-CM | POA: Diagnosis not present

## 2017-09-17 DIAGNOSIS — E213 Hyperparathyroidism, unspecified: Secondary | ICD-10-CM | POA: Diagnosis not present

## 2017-09-20 DIAGNOSIS — Z7901 Long term (current) use of anticoagulants: Secondary | ICD-10-CM | POA: Diagnosis not present

## 2017-09-20 DIAGNOSIS — E213 Hyperparathyroidism, unspecified: Secondary | ICD-10-CM | POA: Diagnosis not present

## 2017-09-20 DIAGNOSIS — Z992 Dependence on renal dialysis: Secondary | ICD-10-CM | POA: Diagnosis not present

## 2017-09-20 DIAGNOSIS — D631 Anemia in chronic kidney disease: Secondary | ICD-10-CM | POA: Diagnosis not present

## 2017-09-20 DIAGNOSIS — R270 Ataxia, unspecified: Secondary | ICD-10-CM | POA: Diagnosis not present

## 2017-09-20 DIAGNOSIS — N186 End stage renal disease: Secondary | ICD-10-CM | POA: Diagnosis not present

## 2017-09-20 DIAGNOSIS — D509 Iron deficiency anemia, unspecified: Secondary | ICD-10-CM | POA: Diagnosis not present

## 2017-09-20 DIAGNOSIS — R609 Edema, unspecified: Secondary | ICD-10-CM | POA: Diagnosis not present

## 2017-09-22 DIAGNOSIS — D509 Iron deficiency anemia, unspecified: Secondary | ICD-10-CM | POA: Diagnosis not present

## 2017-09-22 DIAGNOSIS — D631 Anemia in chronic kidney disease: Secondary | ICD-10-CM | POA: Diagnosis not present

## 2017-09-22 DIAGNOSIS — Z992 Dependence on renal dialysis: Secondary | ICD-10-CM | POA: Diagnosis not present

## 2017-09-22 DIAGNOSIS — R609 Edema, unspecified: Secondary | ICD-10-CM | POA: Diagnosis not present

## 2017-09-22 DIAGNOSIS — N186 End stage renal disease: Secondary | ICD-10-CM | POA: Diagnosis not present

## 2017-09-22 DIAGNOSIS — E213 Hyperparathyroidism, unspecified: Secondary | ICD-10-CM | POA: Diagnosis not present

## 2017-09-24 DIAGNOSIS — D631 Anemia in chronic kidney disease: Secondary | ICD-10-CM | POA: Diagnosis not present

## 2017-09-24 DIAGNOSIS — N186 End stage renal disease: Secondary | ICD-10-CM | POA: Diagnosis not present

## 2017-09-24 DIAGNOSIS — E213 Hyperparathyroidism, unspecified: Secondary | ICD-10-CM | POA: Diagnosis not present

## 2017-09-24 DIAGNOSIS — R609 Edema, unspecified: Secondary | ICD-10-CM | POA: Diagnosis not present

## 2017-09-24 DIAGNOSIS — D509 Iron deficiency anemia, unspecified: Secondary | ICD-10-CM | POA: Diagnosis not present

## 2017-09-24 DIAGNOSIS — R270 Ataxia, unspecified: Secondary | ICD-10-CM | POA: Diagnosis not present

## 2017-09-24 DIAGNOSIS — Z992 Dependence on renal dialysis: Secondary | ICD-10-CM | POA: Diagnosis not present

## 2017-09-26 DIAGNOSIS — D509 Iron deficiency anemia, unspecified: Secondary | ICD-10-CM | POA: Diagnosis not present

## 2017-09-26 DIAGNOSIS — Z992 Dependence on renal dialysis: Secondary | ICD-10-CM | POA: Diagnosis not present

## 2017-09-26 DIAGNOSIS — E213 Hyperparathyroidism, unspecified: Secondary | ICD-10-CM | POA: Diagnosis not present

## 2017-09-26 DIAGNOSIS — D631 Anemia in chronic kidney disease: Secondary | ICD-10-CM | POA: Diagnosis not present

## 2017-09-26 DIAGNOSIS — R270 Ataxia, unspecified: Secondary | ICD-10-CM | POA: Diagnosis not present

## 2017-09-26 DIAGNOSIS — N186 End stage renal disease: Secondary | ICD-10-CM | POA: Diagnosis not present

## 2017-09-28 DIAGNOSIS — I120 Hypertensive chronic kidney disease with stage 5 chronic kidney disease or end stage renal disease: Secondary | ICD-10-CM | POA: Diagnosis not present

## 2017-09-28 DIAGNOSIS — T8249XA Other complication of vascular dialysis catheter, initial encounter: Secondary | ICD-10-CM | POA: Diagnosis not present

## 2017-09-28 DIAGNOSIS — T82898A Other specified complication of vascular prosthetic devices, implants and grafts, initial encounter: Secondary | ICD-10-CM | POA: Diagnosis not present

## 2017-09-28 DIAGNOSIS — Z86718 Personal history of other venous thrombosis and embolism: Secondary | ICD-10-CM | POA: Diagnosis not present

## 2017-09-28 DIAGNOSIS — G8911 Acute pain due to trauma: Secondary | ICD-10-CM | POA: Diagnosis not present

## 2017-09-28 DIAGNOSIS — E1140 Type 2 diabetes mellitus with diabetic neuropathy, unspecified: Secondary | ICD-10-CM | POA: Diagnosis not present

## 2017-09-28 DIAGNOSIS — Z992 Dependence on renal dialysis: Secondary | ICD-10-CM | POA: Diagnosis not present

## 2017-09-28 DIAGNOSIS — R7989 Other specified abnormal findings of blood chemistry: Secondary | ICD-10-CM | POA: Diagnosis not present

## 2017-09-28 DIAGNOSIS — N186 End stage renal disease: Secondary | ICD-10-CM | POA: Diagnosis not present

## 2017-09-28 DIAGNOSIS — Z7901 Long term (current) use of anticoagulants: Secondary | ICD-10-CM | POA: Diagnosis not present

## 2017-09-28 DIAGNOSIS — E1122 Type 2 diabetes mellitus with diabetic chronic kidney disease: Secondary | ICD-10-CM | POA: Diagnosis not present

## 2017-09-28 DIAGNOSIS — R531 Weakness: Secondary | ICD-10-CM | POA: Diagnosis not present

## 2017-09-29 DIAGNOSIS — R609 Edema, unspecified: Secondary | ICD-10-CM | POA: Diagnosis not present

## 2017-09-29 DIAGNOSIS — Z7901 Long term (current) use of anticoagulants: Secondary | ICD-10-CM | POA: Diagnosis not present

## 2017-09-29 DIAGNOSIS — Z992 Dependence on renal dialysis: Secondary | ICD-10-CM | POA: Diagnosis not present

## 2017-09-29 DIAGNOSIS — E213 Hyperparathyroidism, unspecified: Secondary | ICD-10-CM | POA: Diagnosis not present

## 2017-09-29 DIAGNOSIS — N186 End stage renal disease: Secondary | ICD-10-CM | POA: Diagnosis not present

## 2017-09-29 DIAGNOSIS — D631 Anemia in chronic kidney disease: Secondary | ICD-10-CM | POA: Diagnosis not present

## 2017-09-29 DIAGNOSIS — D509 Iron deficiency anemia, unspecified: Secondary | ICD-10-CM | POA: Diagnosis not present

## 2017-10-01 DIAGNOSIS — D509 Iron deficiency anemia, unspecified: Secondary | ICD-10-CM | POA: Diagnosis not present

## 2017-10-01 DIAGNOSIS — R531 Weakness: Secondary | ICD-10-CM | POA: Diagnosis not present

## 2017-10-01 DIAGNOSIS — E213 Hyperparathyroidism, unspecified: Secondary | ICD-10-CM | POA: Diagnosis not present

## 2017-10-01 DIAGNOSIS — N186 End stage renal disease: Secondary | ICD-10-CM | POA: Diagnosis not present

## 2017-10-01 DIAGNOSIS — D631 Anemia in chronic kidney disease: Secondary | ICD-10-CM | POA: Diagnosis not present

## 2017-10-01 DIAGNOSIS — R609 Edema, unspecified: Secondary | ICD-10-CM | POA: Diagnosis not present

## 2017-10-01 DIAGNOSIS — Z992 Dependence on renal dialysis: Secondary | ICD-10-CM | POA: Diagnosis not present

## 2017-10-03 DIAGNOSIS — E213 Hyperparathyroidism, unspecified: Secondary | ICD-10-CM | POA: Diagnosis not present

## 2017-10-03 DIAGNOSIS — R609 Edema, unspecified: Secondary | ICD-10-CM | POA: Diagnosis not present

## 2017-10-03 DIAGNOSIS — D509 Iron deficiency anemia, unspecified: Secondary | ICD-10-CM | POA: Diagnosis not present

## 2017-10-03 DIAGNOSIS — D631 Anemia in chronic kidney disease: Secondary | ICD-10-CM | POA: Diagnosis not present

## 2017-10-03 DIAGNOSIS — N186 End stage renal disease: Secondary | ICD-10-CM | POA: Diagnosis not present

## 2017-10-03 DIAGNOSIS — Z992 Dependence on renal dialysis: Secondary | ICD-10-CM | POA: Diagnosis not present

## 2017-10-04 DIAGNOSIS — I272 Pulmonary hypertension, unspecified: Secondary | ICD-10-CM | POA: Diagnosis not present

## 2017-10-04 DIAGNOSIS — M1990 Unspecified osteoarthritis, unspecified site: Secondary | ICD-10-CM | POA: Diagnosis present

## 2017-10-04 DIAGNOSIS — E782 Mixed hyperlipidemia: Secondary | ICD-10-CM | POA: Diagnosis not present

## 2017-10-04 DIAGNOSIS — T8249XA Other complication of vascular dialysis catheter, initial encounter: Secondary | ICD-10-CM | POA: Diagnosis not present

## 2017-10-04 DIAGNOSIS — Z7901 Long term (current) use of anticoagulants: Secondary | ICD-10-CM | POA: Diagnosis not present

## 2017-10-04 DIAGNOSIS — Z9115 Patient's noncompliance with renal dialysis: Secondary | ICD-10-CM | POA: Diagnosis not present

## 2017-10-04 DIAGNOSIS — M109 Gout, unspecified: Secondary | ICD-10-CM | POA: Diagnosis present

## 2017-10-04 DIAGNOSIS — D631 Anemia in chronic kidney disease: Secondary | ICD-10-CM | POA: Diagnosis present

## 2017-10-04 DIAGNOSIS — R079 Chest pain, unspecified: Secondary | ICD-10-CM | POA: Diagnosis not present

## 2017-10-04 DIAGNOSIS — L03116 Cellulitis of left lower limb: Secondary | ICD-10-CM | POA: Diagnosis present

## 2017-10-04 DIAGNOSIS — T82221A Breakdown (mechanical) of biological heart valve graft, initial encounter: Secondary | ICD-10-CM | POA: Diagnosis not present

## 2017-10-04 DIAGNOSIS — G4733 Obstructive sleep apnea (adult) (pediatric): Secondary | ICD-10-CM | POA: Diagnosis present

## 2017-10-04 DIAGNOSIS — Z886 Allergy status to analgesic agent status: Secondary | ICD-10-CM | POA: Diagnosis not present

## 2017-10-04 DIAGNOSIS — Z86718 Personal history of other venous thrombosis and embolism: Secondary | ICD-10-CM | POA: Diagnosis not present

## 2017-10-04 DIAGNOSIS — Z992 Dependence on renal dialysis: Secondary | ICD-10-CM | POA: Diagnosis not present

## 2017-10-04 DIAGNOSIS — I120 Hypertensive chronic kidney disease with stage 5 chronic kidney disease or end stage renal disease: Secondary | ICD-10-CM | POA: Diagnosis not present

## 2017-10-04 DIAGNOSIS — E1142 Type 2 diabetes mellitus with diabetic polyneuropathy: Secondary | ICD-10-CM | POA: Diagnosis not present

## 2017-10-04 DIAGNOSIS — Z9119 Patient's noncompliance with other medical treatment and regimen: Secondary | ICD-10-CM | POA: Diagnosis not present

## 2017-10-04 DIAGNOSIS — Z89021 Acquired absence of right finger(s): Secondary | ICD-10-CM | POA: Diagnosis not present

## 2017-10-04 DIAGNOSIS — Z794 Long term (current) use of insulin: Secondary | ICD-10-CM | POA: Diagnosis not present

## 2017-10-04 DIAGNOSIS — E1122 Type 2 diabetes mellitus with diabetic chronic kidney disease: Secondary | ICD-10-CM | POA: Diagnosis present

## 2017-10-04 DIAGNOSIS — N186 End stage renal disease: Secondary | ICD-10-CM | POA: Diagnosis not present

## 2017-10-04 DIAGNOSIS — I728 Aneurysm of other specified arteries: Secondary | ICD-10-CM | POA: Diagnosis not present

## 2017-10-04 DIAGNOSIS — Z6841 Body Mass Index (BMI) 40.0 and over, adult: Secondary | ICD-10-CM | POA: Diagnosis not present

## 2017-10-04 DIAGNOSIS — I872 Venous insufficiency (chronic) (peripheral): Secondary | ICD-10-CM | POA: Diagnosis present

## 2017-10-04 DIAGNOSIS — E8770 Fluid overload, unspecified: Secondary | ICD-10-CM | POA: Diagnosis not present

## 2017-10-04 DIAGNOSIS — G894 Chronic pain syndrome: Secondary | ICD-10-CM | POA: Diagnosis present

## 2017-10-04 DIAGNOSIS — E875 Hyperkalemia: Secondary | ICD-10-CM | POA: Diagnosis not present

## 2017-10-05 DIAGNOSIS — N186 End stage renal disease: Secondary | ICD-10-CM | POA: Diagnosis not present

## 2017-10-06 DIAGNOSIS — T8241XA Breakdown (mechanical) of vascular dialysis catheter, initial encounter: Secondary | ICD-10-CM | POA: Diagnosis present

## 2017-10-06 DIAGNOSIS — G894 Chronic pain syndrome: Secondary | ICD-10-CM | POA: Diagnosis not present

## 2017-10-06 DIAGNOSIS — R531 Weakness: Secondary | ICD-10-CM | POA: Diagnosis not present

## 2017-10-06 DIAGNOSIS — G4733 Obstructive sleep apnea (adult) (pediatric): Secondary | ICD-10-CM | POA: Diagnosis present

## 2017-10-06 DIAGNOSIS — E875 Hyperkalemia: Secondary | ICD-10-CM | POA: Diagnosis not present

## 2017-10-06 DIAGNOSIS — Z452 Encounter for adjustment and management of vascular access device: Secondary | ICD-10-CM | POA: Diagnosis not present

## 2017-10-06 DIAGNOSIS — Z8249 Family history of ischemic heart disease and other diseases of the circulatory system: Secondary | ICD-10-CM | POA: Diagnosis not present

## 2017-10-06 DIAGNOSIS — I120 Hypertensive chronic kidney disease with stage 5 chronic kidney disease or end stage renal disease: Secondary | ICD-10-CM | POA: Diagnosis present

## 2017-10-06 DIAGNOSIS — E1142 Type 2 diabetes mellitus with diabetic polyneuropathy: Secondary | ICD-10-CM | POA: Diagnosis not present

## 2017-10-06 DIAGNOSIS — E1122 Type 2 diabetes mellitus with diabetic chronic kidney disease: Secondary | ICD-10-CM | POA: Diagnosis present

## 2017-10-06 DIAGNOSIS — Z7901 Long term (current) use of anticoagulants: Secondary | ICD-10-CM | POA: Diagnosis not present

## 2017-10-06 DIAGNOSIS — R079 Chest pain, unspecified: Secondary | ICD-10-CM | POA: Diagnosis not present

## 2017-10-06 DIAGNOSIS — N186 End stage renal disease: Secondary | ICD-10-CM | POA: Diagnosis not present

## 2017-10-06 DIAGNOSIS — K219 Gastro-esophageal reflux disease without esophagitis: Secondary | ICD-10-CM | POA: Diagnosis not present

## 2017-10-06 DIAGNOSIS — T8249XA Other complication of vascular dialysis catheter, initial encounter: Secondary | ICD-10-CM | POA: Diagnosis not present

## 2017-10-06 DIAGNOSIS — Z6841 Body Mass Index (BMI) 40.0 and over, adult: Secondary | ICD-10-CM | POA: Diagnosis not present

## 2017-10-06 DIAGNOSIS — Z992 Dependence on renal dialysis: Secondary | ICD-10-CM | POA: Diagnosis not present

## 2017-10-06 DIAGNOSIS — D631 Anemia in chronic kidney disease: Secondary | ICD-10-CM | POA: Diagnosis present

## 2017-10-06 DIAGNOSIS — N250 Renal osteodystrophy: Secondary | ICD-10-CM | POA: Diagnosis present

## 2017-10-06 DIAGNOSIS — N19 Unspecified kidney failure: Secondary | ICD-10-CM | POA: Diagnosis not present

## 2017-10-06 DIAGNOSIS — E872 Acidosis: Secondary | ICD-10-CM | POA: Diagnosis not present

## 2017-10-08 DIAGNOSIS — Z7901 Long term (current) use of anticoagulants: Secondary | ICD-10-CM | POA: Diagnosis not present

## 2017-10-08 DIAGNOSIS — D631 Anemia in chronic kidney disease: Secondary | ICD-10-CM | POA: Diagnosis not present

## 2017-10-08 DIAGNOSIS — E213 Hyperparathyroidism, unspecified: Secondary | ICD-10-CM | POA: Diagnosis not present

## 2017-10-08 DIAGNOSIS — R270 Ataxia, unspecified: Secondary | ICD-10-CM | POA: Diagnosis not present

## 2017-10-08 DIAGNOSIS — Z992 Dependence on renal dialysis: Secondary | ICD-10-CM | POA: Diagnosis not present

## 2017-10-08 DIAGNOSIS — D509 Iron deficiency anemia, unspecified: Secondary | ICD-10-CM | POA: Diagnosis not present

## 2017-10-08 DIAGNOSIS — R609 Edema, unspecified: Secondary | ICD-10-CM | POA: Diagnosis not present

## 2017-10-08 DIAGNOSIS — N186 End stage renal disease: Secondary | ICD-10-CM | POA: Diagnosis not present

## 2017-10-11 DIAGNOSIS — D631 Anemia in chronic kidney disease: Secondary | ICD-10-CM | POA: Diagnosis not present

## 2017-10-11 DIAGNOSIS — D509 Iron deficiency anemia, unspecified: Secondary | ICD-10-CM | POA: Diagnosis not present

## 2017-10-11 DIAGNOSIS — N186 End stage renal disease: Secondary | ICD-10-CM | POA: Diagnosis not present

## 2017-10-11 DIAGNOSIS — Z992 Dependence on renal dialysis: Secondary | ICD-10-CM | POA: Diagnosis not present

## 2017-10-11 DIAGNOSIS — E1129 Type 2 diabetes mellitus with other diabetic kidney complication: Secondary | ICD-10-CM | POA: Diagnosis not present

## 2017-10-11 DIAGNOSIS — Z7901 Long term (current) use of anticoagulants: Secondary | ICD-10-CM | POA: Diagnosis not present

## 2017-10-11 DIAGNOSIS — E213 Hyperparathyroidism, unspecified: Secondary | ICD-10-CM | POA: Diagnosis not present

## 2017-10-11 DIAGNOSIS — R609 Edema, unspecified: Secondary | ICD-10-CM | POA: Diagnosis not present

## 2017-10-13 DIAGNOSIS — N186 End stage renal disease: Secondary | ICD-10-CM | POA: Diagnosis not present

## 2017-10-13 DIAGNOSIS — D509 Iron deficiency anemia, unspecified: Secondary | ICD-10-CM | POA: Diagnosis not present

## 2017-10-13 DIAGNOSIS — R270 Ataxia, unspecified: Secondary | ICD-10-CM | POA: Diagnosis not present

## 2017-10-13 DIAGNOSIS — Z992 Dependence on renal dialysis: Secondary | ICD-10-CM | POA: Diagnosis not present

## 2017-10-13 DIAGNOSIS — D631 Anemia in chronic kidney disease: Secondary | ICD-10-CM | POA: Diagnosis not present

## 2017-10-13 DIAGNOSIS — E213 Hyperparathyroidism, unspecified: Secondary | ICD-10-CM | POA: Diagnosis not present

## 2017-10-15 DIAGNOSIS — D509 Iron deficiency anemia, unspecified: Secondary | ICD-10-CM | POA: Diagnosis not present

## 2017-10-15 DIAGNOSIS — R600 Localized edema: Secondary | ICD-10-CM | POA: Diagnosis not present

## 2017-10-15 DIAGNOSIS — R270 Ataxia, unspecified: Secondary | ICD-10-CM | POA: Diagnosis not present

## 2017-10-15 DIAGNOSIS — Z992 Dependence on renal dialysis: Secondary | ICD-10-CM | POA: Diagnosis not present

## 2017-10-15 DIAGNOSIS — E213 Hyperparathyroidism, unspecified: Secondary | ICD-10-CM | POA: Diagnosis not present

## 2017-10-15 DIAGNOSIS — D631 Anemia in chronic kidney disease: Secondary | ICD-10-CM | POA: Diagnosis not present

## 2017-10-15 DIAGNOSIS — N186 End stage renal disease: Secondary | ICD-10-CM | POA: Diagnosis not present

## 2017-10-18 DIAGNOSIS — R270 Ataxia, unspecified: Secondary | ICD-10-CM | POA: Diagnosis not present

## 2017-10-18 DIAGNOSIS — R600 Localized edema: Secondary | ICD-10-CM | POA: Diagnosis not present

## 2017-10-18 DIAGNOSIS — Z7901 Long term (current) use of anticoagulants: Secondary | ICD-10-CM | POA: Diagnosis not present

## 2017-10-18 DIAGNOSIS — N186 End stage renal disease: Secondary | ICD-10-CM | POA: Diagnosis not present

## 2017-10-18 DIAGNOSIS — D509 Iron deficiency anemia, unspecified: Secondary | ICD-10-CM | POA: Diagnosis not present

## 2017-10-18 DIAGNOSIS — E213 Hyperparathyroidism, unspecified: Secondary | ICD-10-CM | POA: Diagnosis not present

## 2017-10-18 DIAGNOSIS — D631 Anemia in chronic kidney disease: Secondary | ICD-10-CM | POA: Diagnosis not present

## 2017-10-18 DIAGNOSIS — Z992 Dependence on renal dialysis: Secondary | ICD-10-CM | POA: Diagnosis not present

## 2017-10-20 DIAGNOSIS — E213 Hyperparathyroidism, unspecified: Secondary | ICD-10-CM | POA: Diagnosis not present

## 2017-10-20 DIAGNOSIS — N186 End stage renal disease: Secondary | ICD-10-CM | POA: Diagnosis not present

## 2017-10-20 DIAGNOSIS — D631 Anemia in chronic kidney disease: Secondary | ICD-10-CM | POA: Diagnosis not present

## 2017-10-20 DIAGNOSIS — R270 Ataxia, unspecified: Secondary | ICD-10-CM | POA: Diagnosis not present

## 2017-10-20 DIAGNOSIS — Z992 Dependence on renal dialysis: Secondary | ICD-10-CM | POA: Diagnosis not present

## 2017-10-20 DIAGNOSIS — D509 Iron deficiency anemia, unspecified: Secondary | ICD-10-CM | POA: Diagnosis not present

## 2017-10-22 DIAGNOSIS — E213 Hyperparathyroidism, unspecified: Secondary | ICD-10-CM | POA: Diagnosis not present

## 2017-10-22 DIAGNOSIS — D509 Iron deficiency anemia, unspecified: Secondary | ICD-10-CM | POA: Diagnosis not present

## 2017-10-22 DIAGNOSIS — R270 Ataxia, unspecified: Secondary | ICD-10-CM | POA: Diagnosis not present

## 2017-10-22 DIAGNOSIS — N186 End stage renal disease: Secondary | ICD-10-CM | POA: Diagnosis not present

## 2017-10-22 DIAGNOSIS — G933 Postviral fatigue syndrome: Secondary | ICD-10-CM | POA: Diagnosis not present

## 2017-10-22 DIAGNOSIS — D631 Anemia in chronic kidney disease: Secondary | ICD-10-CM | POA: Diagnosis not present

## 2017-10-22 DIAGNOSIS — Z992 Dependence on renal dialysis: Secondary | ICD-10-CM | POA: Diagnosis not present

## 2017-10-25 DIAGNOSIS — I10 Essential (primary) hypertension: Secondary | ICD-10-CM | POA: Diagnosis not present

## 2017-10-25 DIAGNOSIS — R270 Ataxia, unspecified: Secondary | ICD-10-CM | POA: Diagnosis not present

## 2017-10-25 DIAGNOSIS — T8249XA Other complication of vascular dialysis catheter, initial encounter: Secondary | ICD-10-CM | POA: Diagnosis not present

## 2017-10-25 DIAGNOSIS — E1122 Type 2 diabetes mellitus with diabetic chronic kidney disease: Secondary | ICD-10-CM | POA: Diagnosis not present

## 2017-10-25 DIAGNOSIS — D509 Iron deficiency anemia, unspecified: Secondary | ICD-10-CM | POA: Diagnosis not present

## 2017-10-25 DIAGNOSIS — D631 Anemia in chronic kidney disease: Secondary | ICD-10-CM | POA: Diagnosis not present

## 2017-10-25 DIAGNOSIS — E213 Hyperparathyroidism, unspecified: Secondary | ICD-10-CM | POA: Diagnosis not present

## 2017-10-25 DIAGNOSIS — Z992 Dependence on renal dialysis: Secondary | ICD-10-CM | POA: Diagnosis not present

## 2017-10-25 DIAGNOSIS — Z7901 Long term (current) use of anticoagulants: Secondary | ICD-10-CM | POA: Diagnosis not present

## 2017-10-25 DIAGNOSIS — N186 End stage renal disease: Secondary | ICD-10-CM | POA: Diagnosis not present

## 2017-10-25 DIAGNOSIS — I120 Hypertensive chronic kidney disease with stage 5 chronic kidney disease or end stage renal disease: Secondary | ICD-10-CM | POA: Diagnosis not present

## 2017-10-25 DIAGNOSIS — T8241XA Breakdown (mechanical) of vascular dialysis catheter, initial encounter: Secondary | ICD-10-CM | POA: Diagnosis not present

## 2017-10-25 DIAGNOSIS — T82221A Breakdown (mechanical) of biological heart valve graft, initial encounter: Secondary | ICD-10-CM | POA: Diagnosis not present

## 2017-10-27 DIAGNOSIS — E213 Hyperparathyroidism, unspecified: Secondary | ICD-10-CM | POA: Diagnosis not present

## 2017-10-27 DIAGNOSIS — D509 Iron deficiency anemia, unspecified: Secondary | ICD-10-CM | POA: Diagnosis not present

## 2017-10-27 DIAGNOSIS — R270 Ataxia, unspecified: Secondary | ICD-10-CM | POA: Diagnosis not present

## 2017-10-27 DIAGNOSIS — N186 End stage renal disease: Secondary | ICD-10-CM | POA: Diagnosis not present

## 2017-10-27 DIAGNOSIS — D631 Anemia in chronic kidney disease: Secondary | ICD-10-CM | POA: Diagnosis not present

## 2017-10-27 DIAGNOSIS — Z992 Dependence on renal dialysis: Secondary | ICD-10-CM | POA: Diagnosis not present

## 2017-10-29 DIAGNOSIS — Z992 Dependence on renal dialysis: Secondary | ICD-10-CM | POA: Diagnosis not present

## 2017-10-29 DIAGNOSIS — R609 Edema, unspecified: Secondary | ICD-10-CM | POA: Diagnosis not present

## 2017-10-29 DIAGNOSIS — E213 Hyperparathyroidism, unspecified: Secondary | ICD-10-CM | POA: Diagnosis not present

## 2017-10-29 DIAGNOSIS — R270 Ataxia, unspecified: Secondary | ICD-10-CM | POA: Diagnosis not present

## 2017-10-29 DIAGNOSIS — D509 Iron deficiency anemia, unspecified: Secondary | ICD-10-CM | POA: Diagnosis not present

## 2017-10-29 DIAGNOSIS — N186 End stage renal disease: Secondary | ICD-10-CM | POA: Diagnosis not present

## 2017-10-29 DIAGNOSIS — D631 Anemia in chronic kidney disease: Secondary | ICD-10-CM | POA: Diagnosis not present

## 2017-11-01 DIAGNOSIS — E213 Hyperparathyroidism, unspecified: Secondary | ICD-10-CM | POA: Diagnosis not present

## 2017-11-01 DIAGNOSIS — Z7901 Long term (current) use of anticoagulants: Secondary | ICD-10-CM | POA: Diagnosis not present

## 2017-11-01 DIAGNOSIS — D631 Anemia in chronic kidney disease: Secondary | ICD-10-CM | POA: Diagnosis not present

## 2017-11-01 DIAGNOSIS — Z992 Dependence on renal dialysis: Secondary | ICD-10-CM | POA: Diagnosis not present

## 2017-11-01 DIAGNOSIS — N186 End stage renal disease: Secondary | ICD-10-CM | POA: Diagnosis not present

## 2017-11-01 DIAGNOSIS — D509 Iron deficiency anemia, unspecified: Secondary | ICD-10-CM | POA: Diagnosis not present

## 2017-11-01 DIAGNOSIS — G933 Postviral fatigue syndrome: Secondary | ICD-10-CM | POA: Diagnosis not present

## 2017-11-03 DIAGNOSIS — G933 Postviral fatigue syndrome: Secondary | ICD-10-CM | POA: Diagnosis not present

## 2017-11-03 DIAGNOSIS — Z992 Dependence on renal dialysis: Secondary | ICD-10-CM | POA: Diagnosis not present

## 2017-11-03 DIAGNOSIS — D631 Anemia in chronic kidney disease: Secondary | ICD-10-CM | POA: Diagnosis not present

## 2017-11-03 DIAGNOSIS — D509 Iron deficiency anemia, unspecified: Secondary | ICD-10-CM | POA: Diagnosis not present

## 2017-11-03 DIAGNOSIS — E213 Hyperparathyroidism, unspecified: Secondary | ICD-10-CM | POA: Diagnosis not present

## 2017-11-03 DIAGNOSIS — N186 End stage renal disease: Secondary | ICD-10-CM | POA: Diagnosis not present

## 2017-11-05 DIAGNOSIS — L97519 Non-pressure chronic ulcer of other part of right foot with unspecified severity: Secondary | ICD-10-CM | POA: Diagnosis not present

## 2017-11-05 DIAGNOSIS — L89899 Pressure ulcer of other site, unspecified stage: Secondary | ICD-10-CM | POA: Diagnosis not present

## 2017-11-05 DIAGNOSIS — E1122 Type 2 diabetes mellitus with diabetic chronic kidney disease: Secondary | ICD-10-CM | POA: Diagnosis not present

## 2017-11-05 DIAGNOSIS — D509 Iron deficiency anemia, unspecified: Secondary | ICD-10-CM | POA: Diagnosis not present

## 2017-11-05 DIAGNOSIS — L03115 Cellulitis of right lower limb: Secondary | ICD-10-CM | POA: Diagnosis not present

## 2017-11-05 DIAGNOSIS — D631 Anemia in chronic kidney disease: Secondary | ICD-10-CM | POA: Diagnosis not present

## 2017-11-05 DIAGNOSIS — L97529 Non-pressure chronic ulcer of other part of left foot with unspecified severity: Secondary | ICD-10-CM | POA: Diagnosis not present

## 2017-11-05 DIAGNOSIS — M1990 Unspecified osteoarthritis, unspecified site: Secondary | ICD-10-CM | POA: Diagnosis not present

## 2017-11-05 DIAGNOSIS — N186 End stage renal disease: Secondary | ICD-10-CM | POA: Diagnosis not present

## 2017-11-05 DIAGNOSIS — Z992 Dependence on renal dialysis: Secondary | ICD-10-CM | POA: Diagnosis not present

## 2017-11-05 DIAGNOSIS — T8249XA Other complication of vascular dialysis catheter, initial encounter: Secondary | ICD-10-CM | POA: Diagnosis not present

## 2017-11-05 DIAGNOSIS — E11621 Type 2 diabetes mellitus with foot ulcer: Secondary | ICD-10-CM | POA: Diagnosis not present

## 2017-11-05 DIAGNOSIS — E213 Hyperparathyroidism, unspecified: Secondary | ICD-10-CM | POA: Diagnosis not present

## 2017-11-05 DIAGNOSIS — I82291 Chronic embolism and thrombosis of other thoracic veins: Secondary | ICD-10-CM | POA: Diagnosis not present

## 2017-11-05 DIAGNOSIS — I120 Hypertensive chronic kidney disease with stage 5 chronic kidney disease or end stage renal disease: Secondary | ICD-10-CM | POA: Diagnosis not present

## 2017-11-05 DIAGNOSIS — T82898A Other specified complication of vascular prosthetic devices, implants and grafts, initial encounter: Secondary | ICD-10-CM | POA: Diagnosis not present

## 2017-11-05 DIAGNOSIS — I8290 Acute embolism and thrombosis of unspecified vein: Secondary | ICD-10-CM | POA: Diagnosis not present

## 2017-11-05 DIAGNOSIS — R531 Weakness: Secondary | ICD-10-CM | POA: Diagnosis not present

## 2017-11-05 DIAGNOSIS — Z86718 Personal history of other venous thrombosis and embolism: Secondary | ICD-10-CM | POA: Diagnosis not present

## 2017-11-08 DIAGNOSIS — R270 Ataxia, unspecified: Secondary | ICD-10-CM | POA: Diagnosis not present

## 2017-11-08 DIAGNOSIS — T8249XA Other complication of vascular dialysis catheter, initial encounter: Secondary | ICD-10-CM | POA: Diagnosis not present

## 2017-11-08 DIAGNOSIS — I82291 Chronic embolism and thrombosis of other thoracic veins: Secondary | ICD-10-CM | POA: Diagnosis not present

## 2017-11-08 DIAGNOSIS — T82221A Breakdown (mechanical) of biological heart valve graft, initial encounter: Secondary | ICD-10-CM | POA: Diagnosis not present

## 2017-11-08 DIAGNOSIS — Z992 Dependence on renal dialysis: Secondary | ICD-10-CM | POA: Diagnosis not present

## 2017-11-08 DIAGNOSIS — I8290 Acute embolism and thrombosis of unspecified vein: Secondary | ICD-10-CM | POA: Diagnosis not present

## 2017-11-08 DIAGNOSIS — N186 End stage renal disease: Secondary | ICD-10-CM | POA: Diagnosis not present

## 2017-11-08 DIAGNOSIS — E1122 Type 2 diabetes mellitus with diabetic chronic kidney disease: Secondary | ICD-10-CM | POA: Diagnosis not present

## 2017-11-08 DIAGNOSIS — T82898A Other specified complication of vascular prosthetic devices, implants and grafts, initial encounter: Secondary | ICD-10-CM | POA: Diagnosis not present

## 2017-11-08 DIAGNOSIS — Z86718 Personal history of other venous thrombosis and embolism: Secondary | ICD-10-CM | POA: Diagnosis not present

## 2017-11-08 DIAGNOSIS — I120 Hypertensive chronic kidney disease with stage 5 chronic kidney disease or end stage renal disease: Secondary | ICD-10-CM | POA: Diagnosis not present

## 2017-11-09 DIAGNOSIS — Z992 Dependence on renal dialysis: Secondary | ICD-10-CM | POA: Diagnosis not present

## 2017-11-09 DIAGNOSIS — E213 Hyperparathyroidism, unspecified: Secondary | ICD-10-CM | POA: Diagnosis not present

## 2017-11-09 DIAGNOSIS — D631 Anemia in chronic kidney disease: Secondary | ICD-10-CM | POA: Diagnosis not present

## 2017-11-09 DIAGNOSIS — E1129 Type 2 diabetes mellitus with other diabetic kidney complication: Secondary | ICD-10-CM | POA: Diagnosis not present

## 2017-11-09 DIAGNOSIS — N186 End stage renal disease: Secondary | ICD-10-CM | POA: Diagnosis not present

## 2017-11-09 DIAGNOSIS — R531 Weakness: Secondary | ICD-10-CM | POA: Diagnosis not present

## 2017-11-09 DIAGNOSIS — D509 Iron deficiency anemia, unspecified: Secondary | ICD-10-CM | POA: Diagnosis not present

## 2017-11-10 DIAGNOSIS — Z992 Dependence on renal dialysis: Secondary | ICD-10-CM | POA: Diagnosis not present

## 2017-11-10 DIAGNOSIS — D631 Anemia in chronic kidney disease: Secondary | ICD-10-CM | POA: Diagnosis not present

## 2017-11-10 DIAGNOSIS — D509 Iron deficiency anemia, unspecified: Secondary | ICD-10-CM | POA: Diagnosis not present

## 2017-11-10 DIAGNOSIS — R270 Ataxia, unspecified: Secondary | ICD-10-CM | POA: Diagnosis not present

## 2017-11-10 DIAGNOSIS — E213 Hyperparathyroidism, unspecified: Secondary | ICD-10-CM | POA: Diagnosis not present

## 2017-11-10 DIAGNOSIS — N186 End stage renal disease: Secondary | ICD-10-CM | POA: Diagnosis not present

## 2017-11-11 DIAGNOSIS — N185 Chronic kidney disease, stage 5: Secondary | ICD-10-CM | POA: Diagnosis not present

## 2017-11-11 DIAGNOSIS — G4733 Obstructive sleep apnea (adult) (pediatric): Secondary | ICD-10-CM | POA: Diagnosis not present

## 2017-11-11 DIAGNOSIS — R0902 Hypoxemia: Secondary | ICD-10-CM | POA: Diagnosis not present

## 2017-11-11 DIAGNOSIS — R0602 Shortness of breath: Secondary | ICD-10-CM | POA: Diagnosis not present

## 2017-11-11 DIAGNOSIS — R05 Cough: Secondary | ICD-10-CM | POA: Diagnosis not present

## 2017-11-12 DIAGNOSIS — D509 Iron deficiency anemia, unspecified: Secondary | ICD-10-CM | POA: Diagnosis not present

## 2017-11-12 DIAGNOSIS — Z992 Dependence on renal dialysis: Secondary | ICD-10-CM | POA: Diagnosis not present

## 2017-11-12 DIAGNOSIS — D631 Anemia in chronic kidney disease: Secondary | ICD-10-CM | POA: Diagnosis not present

## 2017-11-12 DIAGNOSIS — E213 Hyperparathyroidism, unspecified: Secondary | ICD-10-CM | POA: Diagnosis not present

## 2017-11-12 DIAGNOSIS — D649 Anemia, unspecified: Secondary | ICD-10-CM | POA: Diagnosis not present

## 2017-11-12 DIAGNOSIS — R270 Ataxia, unspecified: Secondary | ICD-10-CM | POA: Diagnosis not present

## 2017-11-12 DIAGNOSIS — N186 End stage renal disease: Secondary | ICD-10-CM | POA: Diagnosis not present

## 2017-11-14 DIAGNOSIS — M25551 Pain in right hip: Secondary | ICD-10-CM | POA: Diagnosis not present

## 2017-11-14 DIAGNOSIS — M1A39X0 Chronic gout due to renal impairment, multiple sites, without tophus (tophi): Secondary | ICD-10-CM | POA: Diagnosis not present

## 2017-11-14 DIAGNOSIS — Z9181 History of falling: Secondary | ICD-10-CM | POA: Diagnosis not present

## 2017-11-14 DIAGNOSIS — Z6841 Body Mass Index (BMI) 40.0 and over, adult: Secondary | ICD-10-CM | POA: Diagnosis not present

## 2017-11-14 DIAGNOSIS — M25562 Pain in left knee: Secondary | ICD-10-CM | POA: Diagnosis not present

## 2017-11-14 DIAGNOSIS — R2681 Unsteadiness on feet: Secondary | ICD-10-CM | POA: Diagnosis not present

## 2017-11-14 DIAGNOSIS — N2581 Secondary hyperparathyroidism of renal origin: Secondary | ICD-10-CM | POA: Diagnosis not present

## 2017-11-14 DIAGNOSIS — Z993 Dependence on wheelchair: Secondary | ICD-10-CM | POA: Diagnosis not present

## 2017-11-14 DIAGNOSIS — E782 Mixed hyperlipidemia: Secondary | ICD-10-CM | POA: Diagnosis not present

## 2017-11-14 DIAGNOSIS — D631 Anemia in chronic kidney disease: Secondary | ICD-10-CM | POA: Diagnosis not present

## 2017-11-14 DIAGNOSIS — L03115 Cellulitis of right lower limb: Secondary | ICD-10-CM | POA: Diagnosis not present

## 2017-11-14 DIAGNOSIS — M6281 Muscle weakness (generalized): Secondary | ICD-10-CM | POA: Diagnosis not present

## 2017-11-14 DIAGNOSIS — N186 End stage renal disease: Secondary | ICD-10-CM | POA: Diagnosis not present

## 2017-11-14 DIAGNOSIS — E1151 Type 2 diabetes mellitus with diabetic peripheral angiopathy without gangrene: Secondary | ICD-10-CM | POA: Diagnosis not present

## 2017-11-14 DIAGNOSIS — I872 Venous insufficiency (chronic) (peripheral): Secondary | ICD-10-CM | POA: Diagnosis not present

## 2017-11-14 DIAGNOSIS — Z992 Dependence on renal dialysis: Secondary | ICD-10-CM | POA: Diagnosis not present

## 2017-11-14 DIAGNOSIS — Z79891 Long term (current) use of opiate analgesic: Secondary | ICD-10-CM | POA: Diagnosis not present

## 2017-11-14 DIAGNOSIS — Z794 Long term (current) use of insulin: Secondary | ICD-10-CM | POA: Diagnosis not present

## 2017-11-14 DIAGNOSIS — Z7901 Long term (current) use of anticoagulants: Secondary | ICD-10-CM | POA: Diagnosis not present

## 2017-11-14 DIAGNOSIS — E1122 Type 2 diabetes mellitus with diabetic chronic kidney disease: Secondary | ICD-10-CM | POA: Diagnosis not present

## 2017-11-14 DIAGNOSIS — E662 Morbid (severe) obesity with alveolar hypoventilation: Secondary | ICD-10-CM | POA: Diagnosis not present

## 2017-11-14 DIAGNOSIS — I1311 Hypertensive heart and chronic kidney disease without heart failure, with stage 5 chronic kidney disease, or end stage renal disease: Secondary | ICD-10-CM | POA: Diagnosis not present

## 2017-11-15 DIAGNOSIS — R079 Chest pain, unspecified: Secondary | ICD-10-CM | POA: Diagnosis not present

## 2017-11-15 DIAGNOSIS — T82520A Displacement of surgically created arteriovenous fistula, initial encounter: Secondary | ICD-10-CM | POA: Diagnosis not present

## 2017-11-15 DIAGNOSIS — I120 Hypertensive chronic kidney disease with stage 5 chronic kidney disease or end stage renal disease: Secondary | ICD-10-CM | POA: Diagnosis not present

## 2017-11-15 DIAGNOSIS — L03116 Cellulitis of left lower limb: Secondary | ICD-10-CM | POA: Diagnosis not present

## 2017-11-15 DIAGNOSIS — E875 Hyperkalemia: Secondary | ICD-10-CM | POA: Diagnosis not present

## 2017-11-15 DIAGNOSIS — T8249XA Other complication of vascular dialysis catheter, initial encounter: Secondary | ICD-10-CM | POA: Diagnosis not present

## 2017-11-15 DIAGNOSIS — T82221A Breakdown (mechanical) of biological heart valve graft, initial encounter: Secondary | ICD-10-CM | POA: Diagnosis not present

## 2017-11-15 DIAGNOSIS — N186 End stage renal disease: Secondary | ICD-10-CM | POA: Diagnosis not present

## 2017-11-15 DIAGNOSIS — I878 Other specified disorders of veins: Secondary | ICD-10-CM | POA: Diagnosis not present

## 2017-11-15 DIAGNOSIS — E1122 Type 2 diabetes mellitus with diabetic chronic kidney disease: Secondary | ICD-10-CM | POA: Diagnosis not present

## 2017-11-15 DIAGNOSIS — Z992 Dependence on renal dialysis: Secondary | ICD-10-CM | POA: Diagnosis not present

## 2017-11-15 DIAGNOSIS — R0602 Shortness of breath: Secondary | ICD-10-CM | POA: Diagnosis not present

## 2017-11-15 DIAGNOSIS — T82868A Thrombosis of vascular prosthetic devices, implants and grafts, initial encounter: Secondary | ICD-10-CM | POA: Diagnosis not present

## 2017-11-15 DIAGNOSIS — D631 Anemia in chronic kidney disease: Secondary | ICD-10-CM | POA: Diagnosis not present

## 2017-11-15 DIAGNOSIS — Z23 Encounter for immunization: Secondary | ICD-10-CM | POA: Diagnosis not present

## 2017-11-15 DIAGNOSIS — I4510 Unspecified right bundle-branch block: Secondary | ICD-10-CM | POA: Diagnosis not present

## 2017-11-16 DIAGNOSIS — T8249XA Other complication of vascular dialysis catheter, initial encounter: Secondary | ICD-10-CM | POA: Diagnosis not present

## 2017-11-16 DIAGNOSIS — T82868A Thrombosis of vascular prosthetic devices, implants and grafts, initial encounter: Secondary | ICD-10-CM | POA: Diagnosis not present

## 2017-11-16 DIAGNOSIS — Z9114 Patient's other noncompliance with medication regimen: Secondary | ICD-10-CM | POA: Diagnosis not present

## 2017-11-16 DIAGNOSIS — M6281 Muscle weakness (generalized): Secondary | ICD-10-CM | POA: Diagnosis not present

## 2017-11-16 DIAGNOSIS — I120 Hypertensive chronic kidney disease with stage 5 chronic kidney disease or end stage renal disease: Secondary | ICD-10-CM | POA: Diagnosis not present

## 2017-11-16 DIAGNOSIS — N186 End stage renal disease: Secondary | ICD-10-CM | POA: Diagnosis not present

## 2017-11-16 DIAGNOSIS — Z992 Dependence on renal dialysis: Secondary | ICD-10-CM | POA: Diagnosis not present

## 2017-11-16 DIAGNOSIS — Z794 Long term (current) use of insulin: Secondary | ICD-10-CM | POA: Diagnosis not present

## 2017-11-16 DIAGNOSIS — Z4901 Encounter for fitting and adjustment of extracorporeal dialysis catheter: Secondary | ICD-10-CM | POA: Diagnosis not present

## 2017-11-16 DIAGNOSIS — L03116 Cellulitis of left lower limb: Secondary | ICD-10-CM | POA: Diagnosis not present

## 2017-11-16 DIAGNOSIS — I1311 Hypertensive heart and chronic kidney disease without heart failure, with stage 5 chronic kidney disease, or end stage renal disease: Secondary | ICD-10-CM | POA: Diagnosis not present

## 2017-11-16 DIAGNOSIS — Z9115 Patient's noncompliance with renal dialysis: Secondary | ICD-10-CM | POA: Diagnosis not present

## 2017-11-16 DIAGNOSIS — Z6841 Body Mass Index (BMI) 40.0 and over, adult: Secondary | ICD-10-CM | POA: Diagnosis not present

## 2017-11-16 DIAGNOSIS — L03115 Cellulitis of right lower limb: Secondary | ICD-10-CM | POA: Diagnosis not present

## 2017-11-16 DIAGNOSIS — R0602 Shortness of breath: Secondary | ICD-10-CM | POA: Diagnosis not present

## 2017-11-16 DIAGNOSIS — E1122 Type 2 diabetes mellitus with diabetic chronic kidney disease: Secondary | ICD-10-CM | POA: Diagnosis not present

## 2017-11-16 DIAGNOSIS — I872 Venous insufficiency (chronic) (peripheral): Secondary | ICD-10-CM | POA: Diagnosis not present

## 2017-11-16 DIAGNOSIS — T82520A Displacement of surgically created arteriovenous fistula, initial encounter: Secondary | ICD-10-CM | POA: Diagnosis not present

## 2017-11-17 DIAGNOSIS — D509 Iron deficiency anemia, unspecified: Secondary | ICD-10-CM | POA: Diagnosis not present

## 2017-11-17 DIAGNOSIS — N186 End stage renal disease: Secondary | ICD-10-CM | POA: Diagnosis not present

## 2017-11-17 DIAGNOSIS — D631 Anemia in chronic kidney disease: Secondary | ICD-10-CM | POA: Diagnosis not present

## 2017-11-17 DIAGNOSIS — R270 Ataxia, unspecified: Secondary | ICD-10-CM | POA: Diagnosis not present

## 2017-11-17 DIAGNOSIS — Z992 Dependence on renal dialysis: Secondary | ICD-10-CM | POA: Diagnosis not present

## 2017-11-17 DIAGNOSIS — E213 Hyperparathyroidism, unspecified: Secondary | ICD-10-CM | POA: Diagnosis not present

## 2017-11-18 DIAGNOSIS — L03115 Cellulitis of right lower limb: Secondary | ICD-10-CM | POA: Diagnosis not present

## 2017-11-18 DIAGNOSIS — I1311 Hypertensive heart and chronic kidney disease without heart failure, with stage 5 chronic kidney disease, or end stage renal disease: Secondary | ICD-10-CM | POA: Diagnosis not present

## 2017-11-18 DIAGNOSIS — M6281 Muscle weakness (generalized): Secondary | ICD-10-CM | POA: Diagnosis not present

## 2017-11-18 DIAGNOSIS — N186 End stage renal disease: Secondary | ICD-10-CM | POA: Diagnosis not present

## 2017-11-18 DIAGNOSIS — E1122 Type 2 diabetes mellitus with diabetic chronic kidney disease: Secondary | ICD-10-CM | POA: Diagnosis not present

## 2017-11-18 DIAGNOSIS — I872 Venous insufficiency (chronic) (peripheral): Secondary | ICD-10-CM | POA: Diagnosis not present

## 2017-11-19 DIAGNOSIS — E213 Hyperparathyroidism, unspecified: Secondary | ICD-10-CM | POA: Diagnosis not present

## 2017-11-19 DIAGNOSIS — N186 End stage renal disease: Secondary | ICD-10-CM | POA: Diagnosis not present

## 2017-11-19 DIAGNOSIS — D631 Anemia in chronic kidney disease: Secondary | ICD-10-CM | POA: Diagnosis not present

## 2017-11-19 DIAGNOSIS — Z992 Dependence on renal dialysis: Secondary | ICD-10-CM | POA: Diagnosis not present

## 2017-11-19 DIAGNOSIS — R531 Weakness: Secondary | ICD-10-CM | POA: Diagnosis not present

## 2017-11-19 DIAGNOSIS — D509 Iron deficiency anemia, unspecified: Secondary | ICD-10-CM | POA: Diagnosis not present

## 2017-11-22 DIAGNOSIS — Z992 Dependence on renal dialysis: Secondary | ICD-10-CM | POA: Diagnosis not present

## 2017-11-22 DIAGNOSIS — N186 End stage renal disease: Secondary | ICD-10-CM | POA: Diagnosis not present

## 2017-11-22 DIAGNOSIS — D509 Iron deficiency anemia, unspecified: Secondary | ICD-10-CM | POA: Diagnosis not present

## 2017-11-22 DIAGNOSIS — E213 Hyperparathyroidism, unspecified: Secondary | ICD-10-CM | POA: Diagnosis not present

## 2017-11-22 DIAGNOSIS — R531 Weakness: Secondary | ICD-10-CM | POA: Diagnosis not present

## 2017-11-22 DIAGNOSIS — D631 Anemia in chronic kidney disease: Secondary | ICD-10-CM | POA: Diagnosis not present

## 2017-11-23 DIAGNOSIS — M6281 Muscle weakness (generalized): Secondary | ICD-10-CM | POA: Diagnosis not present

## 2017-11-23 DIAGNOSIS — E1122 Type 2 diabetes mellitus with diabetic chronic kidney disease: Secondary | ICD-10-CM | POA: Diagnosis not present

## 2017-11-23 DIAGNOSIS — I872 Venous insufficiency (chronic) (peripheral): Secondary | ICD-10-CM | POA: Diagnosis not present

## 2017-11-23 DIAGNOSIS — I1311 Hypertensive heart and chronic kidney disease without heart failure, with stage 5 chronic kidney disease, or end stage renal disease: Secondary | ICD-10-CM | POA: Diagnosis not present

## 2017-11-23 DIAGNOSIS — L03115 Cellulitis of right lower limb: Secondary | ICD-10-CM | POA: Diagnosis not present

## 2017-11-23 DIAGNOSIS — N186 End stage renal disease: Secondary | ICD-10-CM | POA: Diagnosis not present

## 2017-11-24 DIAGNOSIS — R609 Edema, unspecified: Secondary | ICD-10-CM | POA: Diagnosis not present

## 2017-11-24 DIAGNOSIS — E213 Hyperparathyroidism, unspecified: Secondary | ICD-10-CM | POA: Diagnosis not present

## 2017-11-24 DIAGNOSIS — D509 Iron deficiency anemia, unspecified: Secondary | ICD-10-CM | POA: Diagnosis not present

## 2017-11-24 DIAGNOSIS — Z992 Dependence on renal dialysis: Secondary | ICD-10-CM | POA: Diagnosis not present

## 2017-11-24 DIAGNOSIS — N186 End stage renal disease: Secondary | ICD-10-CM | POA: Diagnosis not present

## 2017-11-24 DIAGNOSIS — D631 Anemia in chronic kidney disease: Secondary | ICD-10-CM | POA: Diagnosis not present

## 2017-11-25 DIAGNOSIS — T8241XA Breakdown (mechanical) of vascular dialysis catheter, initial encounter: Secondary | ICD-10-CM | POA: Diagnosis not present

## 2017-11-25 DIAGNOSIS — N186 End stage renal disease: Secondary | ICD-10-CM | POA: Diagnosis not present

## 2017-11-25 DIAGNOSIS — I872 Venous insufficiency (chronic) (peripheral): Secondary | ICD-10-CM | POA: Diagnosis not present

## 2017-11-25 DIAGNOSIS — I120 Hypertensive chronic kidney disease with stage 5 chronic kidney disease or end stage renal disease: Secondary | ICD-10-CM | POA: Diagnosis not present

## 2017-11-25 DIAGNOSIS — I1311 Hypertensive heart and chronic kidney disease without heart failure, with stage 5 chronic kidney disease, or end stage renal disease: Secondary | ICD-10-CM | POA: Diagnosis not present

## 2017-11-25 DIAGNOSIS — Z9981 Dependence on supplemental oxygen: Secondary | ICD-10-CM | POA: Diagnosis not present

## 2017-11-25 DIAGNOSIS — E1122 Type 2 diabetes mellitus with diabetic chronic kidney disease: Secondary | ICD-10-CM | POA: Diagnosis not present

## 2017-11-25 DIAGNOSIS — D631 Anemia in chronic kidney disease: Secondary | ICD-10-CM | POA: Diagnosis not present

## 2017-11-25 DIAGNOSIS — Z886 Allergy status to analgesic agent status: Secondary | ICD-10-CM | POA: Diagnosis not present

## 2017-11-25 DIAGNOSIS — T8249XA Other complication of vascular dialysis catheter, initial encounter: Secondary | ICD-10-CM | POA: Diagnosis not present

## 2017-11-25 DIAGNOSIS — I878 Other specified disorders of veins: Secondary | ICD-10-CM | POA: Diagnosis not present

## 2017-11-25 DIAGNOSIS — Z6841 Body Mass Index (BMI) 40.0 and over, adult: Secondary | ICD-10-CM | POA: Diagnosis not present

## 2017-11-25 DIAGNOSIS — G894 Chronic pain syndrome: Secondary | ICD-10-CM | POA: Diagnosis not present

## 2017-11-25 DIAGNOSIS — Z794 Long term (current) use of insulin: Secondary | ICD-10-CM | POA: Diagnosis not present

## 2017-11-25 DIAGNOSIS — M6281 Muscle weakness (generalized): Secondary | ICD-10-CM | POA: Diagnosis not present

## 2017-11-25 DIAGNOSIS — M109 Gout, unspecified: Secondary | ICD-10-CM | POA: Diagnosis not present

## 2017-11-25 DIAGNOSIS — Z992 Dependence on renal dialysis: Secondary | ICD-10-CM | POA: Diagnosis not present

## 2017-11-25 DIAGNOSIS — G4733 Obstructive sleep apnea (adult) (pediatric): Secondary | ICD-10-CM | POA: Diagnosis not present

## 2017-11-25 DIAGNOSIS — L03115 Cellulitis of right lower limb: Secondary | ICD-10-CM | POA: Diagnosis not present

## 2017-11-25 DIAGNOSIS — R531 Weakness: Secondary | ICD-10-CM | POA: Diagnosis not present

## 2017-11-26 DIAGNOSIS — D509 Iron deficiency anemia, unspecified: Secondary | ICD-10-CM | POA: Diagnosis not present

## 2017-11-26 DIAGNOSIS — N186 End stage renal disease: Secondary | ICD-10-CM | POA: Diagnosis not present

## 2017-11-26 DIAGNOSIS — R270 Ataxia, unspecified: Secondary | ICD-10-CM | POA: Diagnosis not present

## 2017-11-26 DIAGNOSIS — Z992 Dependence on renal dialysis: Secondary | ICD-10-CM | POA: Diagnosis not present

## 2017-11-26 DIAGNOSIS — E213 Hyperparathyroidism, unspecified: Secondary | ICD-10-CM | POA: Diagnosis not present

## 2017-11-26 DIAGNOSIS — D631 Anemia in chronic kidney disease: Secondary | ICD-10-CM | POA: Diagnosis not present

## 2017-11-26 DIAGNOSIS — R531 Weakness: Secondary | ICD-10-CM | POA: Diagnosis not present

## 2017-11-29 DIAGNOSIS — N186 End stage renal disease: Secondary | ICD-10-CM | POA: Diagnosis not present

## 2017-11-29 DIAGNOSIS — D631 Anemia in chronic kidney disease: Secondary | ICD-10-CM | POA: Diagnosis not present

## 2017-11-29 DIAGNOSIS — D509 Iron deficiency anemia, unspecified: Secondary | ICD-10-CM | POA: Diagnosis not present

## 2017-11-29 DIAGNOSIS — Z992 Dependence on renal dialysis: Secondary | ICD-10-CM | POA: Diagnosis not present

## 2017-11-29 DIAGNOSIS — E213 Hyperparathyroidism, unspecified: Secondary | ICD-10-CM | POA: Diagnosis not present

## 2017-11-29 DIAGNOSIS — R531 Weakness: Secondary | ICD-10-CM | POA: Diagnosis not present

## 2017-11-30 DIAGNOSIS — L03115 Cellulitis of right lower limb: Secondary | ICD-10-CM | POA: Diagnosis not present

## 2017-11-30 DIAGNOSIS — M6281 Muscle weakness (generalized): Secondary | ICD-10-CM | POA: Diagnosis not present

## 2017-11-30 DIAGNOSIS — N186 End stage renal disease: Secondary | ICD-10-CM | POA: Diagnosis not present

## 2017-11-30 DIAGNOSIS — I1311 Hypertensive heart and chronic kidney disease without heart failure, with stage 5 chronic kidney disease, or end stage renal disease: Secondary | ICD-10-CM | POA: Diagnosis not present

## 2017-11-30 DIAGNOSIS — I872 Venous insufficiency (chronic) (peripheral): Secondary | ICD-10-CM | POA: Diagnosis not present

## 2017-11-30 DIAGNOSIS — E1122 Type 2 diabetes mellitus with diabetic chronic kidney disease: Secondary | ICD-10-CM | POA: Diagnosis not present

## 2017-12-01 DIAGNOSIS — D509 Iron deficiency anemia, unspecified: Secondary | ICD-10-CM | POA: Diagnosis not present

## 2017-12-01 DIAGNOSIS — R531 Weakness: Secondary | ICD-10-CM | POA: Diagnosis not present

## 2017-12-01 DIAGNOSIS — N186 End stage renal disease: Secondary | ICD-10-CM | POA: Diagnosis not present

## 2017-12-01 DIAGNOSIS — Z992 Dependence on renal dialysis: Secondary | ICD-10-CM | POA: Diagnosis not present

## 2017-12-01 DIAGNOSIS — D631 Anemia in chronic kidney disease: Secondary | ICD-10-CM | POA: Diagnosis not present

## 2017-12-01 DIAGNOSIS — E213 Hyperparathyroidism, unspecified: Secondary | ICD-10-CM | POA: Diagnosis not present

## 2017-12-01 DIAGNOSIS — R270 Ataxia, unspecified: Secondary | ICD-10-CM | POA: Diagnosis not present

## 2017-12-02 DIAGNOSIS — E872 Acidosis: Secondary | ICD-10-CM | POA: Diagnosis not present

## 2017-12-02 DIAGNOSIS — L97521 Non-pressure chronic ulcer of other part of left foot limited to breakdown of skin: Secondary | ICD-10-CM | POA: Diagnosis not present

## 2017-12-02 DIAGNOSIS — N186 End stage renal disease: Secondary | ICD-10-CM | POA: Diagnosis not present

## 2017-12-02 DIAGNOSIS — E875 Hyperkalemia: Secondary | ICD-10-CM | POA: Diagnosis not present

## 2017-12-02 DIAGNOSIS — G4733 Obstructive sleep apnea (adult) (pediatric): Secondary | ICD-10-CM | POA: Diagnosis not present

## 2017-12-02 DIAGNOSIS — E782 Mixed hyperlipidemia: Secondary | ICD-10-CM | POA: Diagnosis not present

## 2017-12-02 DIAGNOSIS — E1165 Type 2 diabetes mellitus with hyperglycemia: Secondary | ICD-10-CM | POA: Diagnosis not present

## 2017-12-02 DIAGNOSIS — M109 Gout, unspecified: Secondary | ICD-10-CM | POA: Diagnosis not present

## 2017-12-02 DIAGNOSIS — I10 Essential (primary) hypertension: Secondary | ICD-10-CM | POA: Diagnosis not present

## 2017-12-03 DIAGNOSIS — Z992 Dependence on renal dialysis: Secondary | ICD-10-CM | POA: Diagnosis not present

## 2017-12-03 DIAGNOSIS — D509 Iron deficiency anemia, unspecified: Secondary | ICD-10-CM | POA: Diagnosis not present

## 2017-12-03 DIAGNOSIS — E213 Hyperparathyroidism, unspecified: Secondary | ICD-10-CM | POA: Diagnosis not present

## 2017-12-03 DIAGNOSIS — R531 Weakness: Secondary | ICD-10-CM | POA: Diagnosis not present

## 2017-12-03 DIAGNOSIS — N186 End stage renal disease: Secondary | ICD-10-CM | POA: Diagnosis not present

## 2017-12-03 DIAGNOSIS — R270 Ataxia, unspecified: Secondary | ICD-10-CM | POA: Diagnosis not present

## 2017-12-03 DIAGNOSIS — E8779 Other fluid overload: Secondary | ICD-10-CM | POA: Diagnosis not present

## 2017-12-03 DIAGNOSIS — D631 Anemia in chronic kidney disease: Secondary | ICD-10-CM | POA: Diagnosis not present

## 2017-12-05 DIAGNOSIS — R918 Other nonspecific abnormal finding of lung field: Secondary | ICD-10-CM | POA: Diagnosis not present

## 2017-12-05 DIAGNOSIS — G8929 Other chronic pain: Secondary | ICD-10-CM | POA: Diagnosis present

## 2017-12-05 DIAGNOSIS — M549 Dorsalgia, unspecified: Secondary | ICD-10-CM | POA: Diagnosis present

## 2017-12-05 DIAGNOSIS — D631 Anemia in chronic kidney disease: Secondary | ICD-10-CM | POA: Diagnosis present

## 2017-12-05 DIAGNOSIS — L03116 Cellulitis of left lower limb: Secondary | ICD-10-CM | POA: Diagnosis not present

## 2017-12-05 DIAGNOSIS — R531 Weakness: Secondary | ICD-10-CM | POA: Diagnosis not present

## 2017-12-05 DIAGNOSIS — Z5181 Encounter for therapeutic drug level monitoring: Secondary | ICD-10-CM | POA: Diagnosis not present

## 2017-12-05 DIAGNOSIS — M109 Gout, unspecified: Secondary | ICD-10-CM | POA: Diagnosis present

## 2017-12-05 DIAGNOSIS — R0789 Other chest pain: Secondary | ICD-10-CM | POA: Diagnosis not present

## 2017-12-05 DIAGNOSIS — Z794 Long term (current) use of insulin: Secondary | ICD-10-CM | POA: Diagnosis not present

## 2017-12-05 DIAGNOSIS — D649 Anemia, unspecified: Secondary | ICD-10-CM | POA: Diagnosis not present

## 2017-12-05 DIAGNOSIS — Z86718 Personal history of other venous thrombosis and embolism: Secondary | ICD-10-CM | POA: Diagnosis not present

## 2017-12-05 DIAGNOSIS — Z9119 Patient's noncompliance with other medical treatment and regimen: Secondary | ICD-10-CM | POA: Diagnosis not present

## 2017-12-05 DIAGNOSIS — Z8249 Family history of ischemic heart disease and other diseases of the circulatory system: Secondary | ICD-10-CM | POA: Diagnosis not present

## 2017-12-05 DIAGNOSIS — N189 Chronic kidney disease, unspecified: Secondary | ICD-10-CM | POA: Diagnosis not present

## 2017-12-05 DIAGNOSIS — I330 Acute and subacute infective endocarditis: Secondary | ICD-10-CM | POA: Diagnosis not present

## 2017-12-05 DIAGNOSIS — M7989 Other specified soft tissue disorders: Secondary | ICD-10-CM | POA: Diagnosis not present

## 2017-12-05 DIAGNOSIS — E162 Hypoglycemia, unspecified: Secondary | ICD-10-CM | POA: Diagnosis not present

## 2017-12-05 DIAGNOSIS — J9601 Acute respiratory failure with hypoxia: Secondary | ICD-10-CM | POA: Diagnosis not present

## 2017-12-05 DIAGNOSIS — R0600 Dyspnea, unspecified: Secondary | ICD-10-CM | POA: Diagnosis not present

## 2017-12-05 DIAGNOSIS — I120 Hypertensive chronic kidney disease with stage 5 chronic kidney disease or end stage renal disease: Secondary | ICD-10-CM | POA: Diagnosis not present

## 2017-12-05 DIAGNOSIS — T82868A Thrombosis of vascular prosthetic devices, implants and grafts, initial encounter: Secondary | ICD-10-CM | POA: Diagnosis not present

## 2017-12-05 DIAGNOSIS — Z833 Family history of diabetes mellitus: Secondary | ICD-10-CM | POA: Diagnosis not present

## 2017-12-05 DIAGNOSIS — Z792 Long term (current) use of antibiotics: Secondary | ICD-10-CM | POA: Diagnosis not present

## 2017-12-05 DIAGNOSIS — N186 End stage renal disease: Secondary | ICD-10-CM | POA: Diagnosis not present

## 2017-12-05 DIAGNOSIS — R4182 Altered mental status, unspecified: Secondary | ICD-10-CM | POA: Diagnosis not present

## 2017-12-05 DIAGNOSIS — E785 Hyperlipidemia, unspecified: Secondary | ICD-10-CM | POA: Diagnosis present

## 2017-12-05 DIAGNOSIS — Z7901 Long term (current) use of anticoagulants: Secondary | ICD-10-CM | POA: Diagnosis not present

## 2017-12-05 DIAGNOSIS — R42 Dizziness and giddiness: Secondary | ICD-10-CM | POA: Diagnosis not present

## 2017-12-05 DIAGNOSIS — G4733 Obstructive sleep apnea (adult) (pediatric): Secondary | ICD-10-CM | POA: Diagnosis present

## 2017-12-05 DIAGNOSIS — Z992 Dependence on renal dialysis: Secondary | ICD-10-CM | POA: Diagnosis not present

## 2017-12-05 DIAGNOSIS — E1122 Type 2 diabetes mellitus with diabetic chronic kidney disease: Secondary | ICD-10-CM | POA: Diagnosis present

## 2017-12-05 DIAGNOSIS — I129 Hypertensive chronic kidney disease with stage 1 through stage 4 chronic kidney disease, or unspecified chronic kidney disease: Secondary | ICD-10-CM | POA: Diagnosis not present

## 2017-12-05 DIAGNOSIS — R6520 Severe sepsis without septic shock: Secondary | ICD-10-CM | POA: Diagnosis present

## 2017-12-05 DIAGNOSIS — I872 Venous insufficiency (chronic) (peripheral): Secondary | ICD-10-CM | POA: Diagnosis present

## 2017-12-05 DIAGNOSIS — A419 Sepsis, unspecified organism: Secondary | ICD-10-CM | POA: Diagnosis not present

## 2017-12-05 DIAGNOSIS — Z6841 Body Mass Index (BMI) 40.0 and over, adult: Secondary | ICD-10-CM | POA: Diagnosis not present

## 2017-12-10 DIAGNOSIS — N186 End stage renal disease: Secondary | ICD-10-CM | POA: Diagnosis not present

## 2017-12-10 DIAGNOSIS — R531 Weakness: Secondary | ICD-10-CM | POA: Diagnosis not present

## 2017-12-10 DIAGNOSIS — E8779 Other fluid overload: Secondary | ICD-10-CM | POA: Diagnosis not present

## 2017-12-10 DIAGNOSIS — E1129 Type 2 diabetes mellitus with other diabetic kidney complication: Secondary | ICD-10-CM | POA: Diagnosis not present

## 2017-12-10 DIAGNOSIS — Z992 Dependence on renal dialysis: Secondary | ICD-10-CM | POA: Diagnosis not present

## 2017-12-10 DIAGNOSIS — E213 Hyperparathyroidism, unspecified: Secondary | ICD-10-CM | POA: Diagnosis not present

## 2017-12-10 DIAGNOSIS — D509 Iron deficiency anemia, unspecified: Secondary | ICD-10-CM | POA: Diagnosis not present

## 2017-12-10 DIAGNOSIS — D631 Anemia in chronic kidney disease: Secondary | ICD-10-CM | POA: Diagnosis not present

## 2017-12-11 DIAGNOSIS — M6281 Muscle weakness (generalized): Secondary | ICD-10-CM | POA: Diagnosis not present

## 2017-12-11 DIAGNOSIS — E1122 Type 2 diabetes mellitus with diabetic chronic kidney disease: Secondary | ICD-10-CM | POA: Diagnosis not present

## 2017-12-11 DIAGNOSIS — I1311 Hypertensive heart and chronic kidney disease without heart failure, with stage 5 chronic kidney disease, or end stage renal disease: Secondary | ICD-10-CM | POA: Diagnosis not present

## 2017-12-11 DIAGNOSIS — N186 End stage renal disease: Secondary | ICD-10-CM | POA: Diagnosis not present

## 2017-12-11 DIAGNOSIS — I872 Venous insufficiency (chronic) (peripheral): Secondary | ICD-10-CM | POA: Diagnosis not present

## 2017-12-11 DIAGNOSIS — L03115 Cellulitis of right lower limb: Secondary | ICD-10-CM | POA: Diagnosis not present

## 2017-12-13 DIAGNOSIS — Z992 Dependence on renal dialysis: Secondary | ICD-10-CM | POA: Diagnosis not present

## 2017-12-13 DIAGNOSIS — N186 End stage renal disease: Secondary | ICD-10-CM | POA: Diagnosis not present

## 2017-12-13 DIAGNOSIS — R270 Ataxia, unspecified: Secondary | ICD-10-CM | POA: Diagnosis not present

## 2017-12-13 DIAGNOSIS — D631 Anemia in chronic kidney disease: Secondary | ICD-10-CM | POA: Diagnosis not present

## 2017-12-13 DIAGNOSIS — E8779 Other fluid overload: Secondary | ICD-10-CM | POA: Diagnosis not present

## 2017-12-13 DIAGNOSIS — E213 Hyperparathyroidism, unspecified: Secondary | ICD-10-CM | POA: Diagnosis not present

## 2017-12-13 DIAGNOSIS — D509 Iron deficiency anemia, unspecified: Secondary | ICD-10-CM | POA: Diagnosis not present

## 2017-12-14 DIAGNOSIS — Z794 Long term (current) use of insulin: Secondary | ICD-10-CM | POA: Diagnosis not present

## 2017-12-14 DIAGNOSIS — I120 Hypertensive chronic kidney disease with stage 5 chronic kidney disease or end stage renal disease: Secondary | ICD-10-CM | POA: Diagnosis not present

## 2017-12-14 DIAGNOSIS — T8249XA Other complication of vascular dialysis catheter, initial encounter: Secondary | ICD-10-CM | POA: Diagnosis not present

## 2017-12-14 DIAGNOSIS — M6281 Muscle weakness (generalized): Secondary | ICD-10-CM | POA: Diagnosis not present

## 2017-12-14 DIAGNOSIS — E11649 Type 2 diabetes mellitus with hypoglycemia without coma: Secondary | ICD-10-CM | POA: Diagnosis not present

## 2017-12-14 DIAGNOSIS — L03115 Cellulitis of right lower limb: Secondary | ICD-10-CM | POA: Diagnosis not present

## 2017-12-14 DIAGNOSIS — E1122 Type 2 diabetes mellitus with diabetic chronic kidney disease: Secondary | ICD-10-CM | POA: Diagnosis not present

## 2017-12-14 DIAGNOSIS — I1311 Hypertensive heart and chronic kidney disease without heart failure, with stage 5 chronic kidney disease, or end stage renal disease: Secondary | ICD-10-CM | POA: Diagnosis not present

## 2017-12-14 DIAGNOSIS — N186 End stage renal disease: Secondary | ICD-10-CM | POA: Diagnosis not present

## 2017-12-14 DIAGNOSIS — Z4901 Encounter for fitting and adjustment of extracorporeal dialysis catheter: Secondary | ICD-10-CM | POA: Diagnosis not present

## 2017-12-14 DIAGNOSIS — Z992 Dependence on renal dialysis: Secondary | ICD-10-CM | POA: Diagnosis not present

## 2017-12-14 DIAGNOSIS — I872 Venous insufficiency (chronic) (peripheral): Secondary | ICD-10-CM | POA: Diagnosis not present

## 2017-12-15 DIAGNOSIS — R270 Ataxia, unspecified: Secondary | ICD-10-CM | POA: Diagnosis not present

## 2017-12-15 DIAGNOSIS — D631 Anemia in chronic kidney disease: Secondary | ICD-10-CM | POA: Diagnosis not present

## 2017-12-15 DIAGNOSIS — D509 Iron deficiency anemia, unspecified: Secondary | ICD-10-CM | POA: Diagnosis not present

## 2017-12-15 DIAGNOSIS — E8779 Other fluid overload: Secondary | ICD-10-CM | POA: Diagnosis not present

## 2017-12-15 DIAGNOSIS — N186 End stage renal disease: Secondary | ICD-10-CM | POA: Diagnosis not present

## 2017-12-15 DIAGNOSIS — Z992 Dependence on renal dialysis: Secondary | ICD-10-CM | POA: Diagnosis not present

## 2017-12-15 DIAGNOSIS — E213 Hyperparathyroidism, unspecified: Secondary | ICD-10-CM | POA: Diagnosis not present

## 2017-12-16 DIAGNOSIS — M6281 Muscle weakness (generalized): Secondary | ICD-10-CM | POA: Diagnosis not present

## 2017-12-16 DIAGNOSIS — N186 End stage renal disease: Secondary | ICD-10-CM | POA: Diagnosis not present

## 2017-12-16 DIAGNOSIS — L03115 Cellulitis of right lower limb: Secondary | ICD-10-CM | POA: Diagnosis not present

## 2017-12-16 DIAGNOSIS — I1311 Hypertensive heart and chronic kidney disease without heart failure, with stage 5 chronic kidney disease, or end stage renal disease: Secondary | ICD-10-CM | POA: Diagnosis not present

## 2017-12-16 DIAGNOSIS — E1122 Type 2 diabetes mellitus with diabetic chronic kidney disease: Secondary | ICD-10-CM | POA: Diagnosis not present

## 2017-12-16 DIAGNOSIS — I872 Venous insufficiency (chronic) (peripheral): Secondary | ICD-10-CM | POA: Diagnosis not present

## 2017-12-17 DIAGNOSIS — Z992 Dependence on renal dialysis: Secondary | ICD-10-CM | POA: Diagnosis not present

## 2017-12-17 DIAGNOSIS — E8779 Other fluid overload: Secondary | ICD-10-CM | POA: Diagnosis not present

## 2017-12-17 DIAGNOSIS — R531 Weakness: Secondary | ICD-10-CM | POA: Diagnosis not present

## 2017-12-17 DIAGNOSIS — E213 Hyperparathyroidism, unspecified: Secondary | ICD-10-CM | POA: Diagnosis not present

## 2017-12-17 DIAGNOSIS — D509 Iron deficiency anemia, unspecified: Secondary | ICD-10-CM | POA: Diagnosis not present

## 2017-12-17 DIAGNOSIS — N186 End stage renal disease: Secondary | ICD-10-CM | POA: Diagnosis not present

## 2017-12-17 DIAGNOSIS — M6281 Muscle weakness (generalized): Secondary | ICD-10-CM | POA: Diagnosis not present

## 2017-12-17 DIAGNOSIS — D631 Anemia in chronic kidney disease: Secondary | ICD-10-CM | POA: Diagnosis not present

## 2017-12-20 DIAGNOSIS — R270 Ataxia, unspecified: Secondary | ICD-10-CM | POA: Diagnosis not present

## 2017-12-20 DIAGNOSIS — N186 End stage renal disease: Secondary | ICD-10-CM | POA: Diagnosis not present

## 2017-12-21 DIAGNOSIS — M6281 Muscle weakness (generalized): Secondary | ICD-10-CM | POA: Diagnosis not present

## 2017-12-21 DIAGNOSIS — L03115 Cellulitis of right lower limb: Secondary | ICD-10-CM | POA: Diagnosis not present

## 2017-12-21 DIAGNOSIS — N186 End stage renal disease: Secondary | ICD-10-CM | POA: Diagnosis not present

## 2017-12-21 DIAGNOSIS — I1311 Hypertensive heart and chronic kidney disease without heart failure, with stage 5 chronic kidney disease, or end stage renal disease: Secondary | ICD-10-CM | POA: Diagnosis not present

## 2017-12-21 DIAGNOSIS — I872 Venous insufficiency (chronic) (peripheral): Secondary | ICD-10-CM | POA: Diagnosis not present

## 2017-12-21 DIAGNOSIS — E1122 Type 2 diabetes mellitus with diabetic chronic kidney disease: Secondary | ICD-10-CM | POA: Diagnosis not present

## 2017-12-22 DIAGNOSIS — R270 Ataxia, unspecified: Secondary | ICD-10-CM | POA: Diagnosis not present

## 2017-12-22 DIAGNOSIS — E1122 Type 2 diabetes mellitus with diabetic chronic kidney disease: Secondary | ICD-10-CM | POA: Diagnosis not present

## 2017-12-22 DIAGNOSIS — R531 Weakness: Secondary | ICD-10-CM | POA: Diagnosis not present

## 2017-12-22 DIAGNOSIS — E8779 Other fluid overload: Secondary | ICD-10-CM | POA: Diagnosis not present

## 2017-12-22 DIAGNOSIS — I1311 Hypertensive heart and chronic kidney disease without heart failure, with stage 5 chronic kidney disease, or end stage renal disease: Secondary | ICD-10-CM | POA: Diagnosis not present

## 2017-12-22 DIAGNOSIS — I872 Venous insufficiency (chronic) (peripheral): Secondary | ICD-10-CM | POA: Diagnosis not present

## 2017-12-22 DIAGNOSIS — E213 Hyperparathyroidism, unspecified: Secondary | ICD-10-CM | POA: Diagnosis not present

## 2017-12-22 DIAGNOSIS — M6281 Muscle weakness (generalized): Secondary | ICD-10-CM | POA: Diagnosis not present

## 2017-12-22 DIAGNOSIS — D631 Anemia in chronic kidney disease: Secondary | ICD-10-CM | POA: Diagnosis not present

## 2017-12-22 DIAGNOSIS — D509 Iron deficiency anemia, unspecified: Secondary | ICD-10-CM | POA: Diagnosis not present

## 2017-12-22 DIAGNOSIS — N189 Chronic kidney disease, unspecified: Secondary | ICD-10-CM | POA: Diagnosis not present

## 2017-12-22 DIAGNOSIS — Z452 Encounter for adjustment and management of vascular access device: Secondary | ICD-10-CM | POA: Diagnosis not present

## 2017-12-22 DIAGNOSIS — N186 End stage renal disease: Secondary | ICD-10-CM | POA: Diagnosis not present

## 2017-12-22 DIAGNOSIS — T8249XA Other complication of vascular dialysis catheter, initial encounter: Secondary | ICD-10-CM | POA: Diagnosis not present

## 2017-12-22 DIAGNOSIS — Z992 Dependence on renal dialysis: Secondary | ICD-10-CM | POA: Diagnosis not present

## 2017-12-22 DIAGNOSIS — L03115 Cellulitis of right lower limb: Secondary | ICD-10-CM | POA: Diagnosis not present

## 2017-12-23 DIAGNOSIS — K219 Gastro-esophageal reflux disease without esophagitis: Secondary | ICD-10-CM | POA: Diagnosis not present

## 2017-12-23 DIAGNOSIS — I1311 Hypertensive heart and chronic kidney disease without heart failure, with stage 5 chronic kidney disease, or end stage renal disease: Secondary | ICD-10-CM | POA: Diagnosis not present

## 2017-12-23 DIAGNOSIS — D509 Iron deficiency anemia, unspecified: Secondary | ICD-10-CM | POA: Diagnosis not present

## 2017-12-23 DIAGNOSIS — Z992 Dependence on renal dialysis: Secondary | ICD-10-CM | POA: Diagnosis not present

## 2017-12-23 DIAGNOSIS — E8779 Other fluid overload: Secondary | ICD-10-CM | POA: Diagnosis not present

## 2017-12-23 DIAGNOSIS — L03115 Cellulitis of right lower limb: Secondary | ICD-10-CM | POA: Diagnosis not present

## 2017-12-23 DIAGNOSIS — G589 Mononeuropathy, unspecified: Secondary | ICD-10-CM | POA: Diagnosis not present

## 2017-12-23 DIAGNOSIS — E1122 Type 2 diabetes mellitus with diabetic chronic kidney disease: Secondary | ICD-10-CM | POA: Diagnosis not present

## 2017-12-23 DIAGNOSIS — E785 Hyperlipidemia, unspecified: Secondary | ICD-10-CM | POA: Diagnosis not present

## 2017-12-23 DIAGNOSIS — R609 Edema, unspecified: Secondary | ICD-10-CM | POA: Diagnosis not present

## 2017-12-23 DIAGNOSIS — D631 Anemia in chronic kidney disease: Secondary | ICD-10-CM | POA: Diagnosis not present

## 2017-12-23 DIAGNOSIS — M109 Gout, unspecified: Secondary | ICD-10-CM | POA: Diagnosis not present

## 2017-12-23 DIAGNOSIS — M6281 Muscle weakness (generalized): Secondary | ICD-10-CM | POA: Diagnosis not present

## 2017-12-23 DIAGNOSIS — E213 Hyperparathyroidism, unspecified: Secondary | ICD-10-CM | POA: Diagnosis not present

## 2017-12-23 DIAGNOSIS — N186 End stage renal disease: Secondary | ICD-10-CM | POA: Diagnosis not present

## 2017-12-23 DIAGNOSIS — I872 Venous insufficiency (chronic) (peripheral): Secondary | ICD-10-CM | POA: Diagnosis not present

## 2017-12-24 DIAGNOSIS — R531 Weakness: Secondary | ICD-10-CM | POA: Diagnosis not present

## 2017-12-24 DIAGNOSIS — D631 Anemia in chronic kidney disease: Secondary | ICD-10-CM | POA: Diagnosis not present

## 2017-12-24 DIAGNOSIS — E8779 Other fluid overload: Secondary | ICD-10-CM | POA: Diagnosis not present

## 2017-12-24 DIAGNOSIS — Z992 Dependence on renal dialysis: Secondary | ICD-10-CM | POA: Diagnosis not present

## 2017-12-24 DIAGNOSIS — N186 End stage renal disease: Secondary | ICD-10-CM | POA: Diagnosis not present

## 2017-12-24 DIAGNOSIS — D509 Iron deficiency anemia, unspecified: Secondary | ICD-10-CM | POA: Diagnosis not present

## 2017-12-24 DIAGNOSIS — E213 Hyperparathyroidism, unspecified: Secondary | ICD-10-CM | POA: Diagnosis not present

## 2017-12-24 DIAGNOSIS — R270 Ataxia, unspecified: Secondary | ICD-10-CM | POA: Diagnosis not present

## 2017-12-27 DIAGNOSIS — Z992 Dependence on renal dialysis: Secondary | ICD-10-CM | POA: Diagnosis not present

## 2017-12-27 DIAGNOSIS — E1122 Type 2 diabetes mellitus with diabetic chronic kidney disease: Secondary | ICD-10-CM | POA: Diagnosis not present

## 2017-12-27 DIAGNOSIS — L03115 Cellulitis of right lower limb: Secondary | ICD-10-CM | POA: Diagnosis not present

## 2017-12-27 DIAGNOSIS — N186 End stage renal disease: Secondary | ICD-10-CM | POA: Diagnosis not present

## 2017-12-27 DIAGNOSIS — E213 Hyperparathyroidism, unspecified: Secondary | ICD-10-CM | POA: Diagnosis not present

## 2017-12-27 DIAGNOSIS — M6281 Muscle weakness (generalized): Secondary | ICD-10-CM | POA: Diagnosis not present

## 2017-12-27 DIAGNOSIS — I872 Venous insufficiency (chronic) (peripheral): Secondary | ICD-10-CM | POA: Diagnosis not present

## 2017-12-27 DIAGNOSIS — D509 Iron deficiency anemia, unspecified: Secondary | ICD-10-CM | POA: Diagnosis not present

## 2017-12-27 DIAGNOSIS — D631 Anemia in chronic kidney disease: Secondary | ICD-10-CM | POA: Diagnosis not present

## 2017-12-27 DIAGNOSIS — E8779 Other fluid overload: Secondary | ICD-10-CM | POA: Diagnosis not present

## 2017-12-27 DIAGNOSIS — R531 Weakness: Secondary | ICD-10-CM | POA: Diagnosis not present

## 2017-12-27 DIAGNOSIS — I1311 Hypertensive heart and chronic kidney disease without heart failure, with stage 5 chronic kidney disease, or end stage renal disease: Secondary | ICD-10-CM | POA: Diagnosis not present

## 2017-12-28 DIAGNOSIS — I872 Venous insufficiency (chronic) (peripheral): Secondary | ICD-10-CM | POA: Diagnosis not present

## 2017-12-28 DIAGNOSIS — M6281 Muscle weakness (generalized): Secondary | ICD-10-CM | POA: Diagnosis not present

## 2017-12-28 DIAGNOSIS — L03115 Cellulitis of right lower limb: Secondary | ICD-10-CM | POA: Diagnosis not present

## 2017-12-28 DIAGNOSIS — I1311 Hypertensive heart and chronic kidney disease without heart failure, with stage 5 chronic kidney disease, or end stage renal disease: Secondary | ICD-10-CM | POA: Diagnosis not present

## 2017-12-28 DIAGNOSIS — E1122 Type 2 diabetes mellitus with diabetic chronic kidney disease: Secondary | ICD-10-CM | POA: Diagnosis not present

## 2017-12-28 DIAGNOSIS — N186 End stage renal disease: Secondary | ICD-10-CM | POA: Diagnosis not present

## 2017-12-29 DIAGNOSIS — E8779 Other fluid overload: Secondary | ICD-10-CM | POA: Diagnosis not present

## 2017-12-29 DIAGNOSIS — D631 Anemia in chronic kidney disease: Secondary | ICD-10-CM | POA: Diagnosis not present

## 2017-12-29 DIAGNOSIS — R531 Weakness: Secondary | ICD-10-CM | POA: Diagnosis not present

## 2017-12-29 DIAGNOSIS — N186 End stage renal disease: Secondary | ICD-10-CM | POA: Diagnosis not present

## 2017-12-29 DIAGNOSIS — Z992 Dependence on renal dialysis: Secondary | ICD-10-CM | POA: Diagnosis not present

## 2017-12-29 DIAGNOSIS — L89899 Pressure ulcer of other site, unspecified stage: Secondary | ICD-10-CM | POA: Diagnosis not present

## 2017-12-29 DIAGNOSIS — E213 Hyperparathyroidism, unspecified: Secondary | ICD-10-CM | POA: Diagnosis not present

## 2017-12-29 DIAGNOSIS — D509 Iron deficiency anemia, unspecified: Secondary | ICD-10-CM | POA: Diagnosis not present

## 2017-12-30 DIAGNOSIS — W19XXXA Unspecified fall, initial encounter: Secondary | ICD-10-CM | POA: Diagnosis not present

## 2017-12-30 DIAGNOSIS — R918 Other nonspecific abnormal finding of lung field: Secondary | ICD-10-CM | POA: Diagnosis not present

## 2017-12-30 DIAGNOSIS — R6521 Severe sepsis with septic shock: Secondary | ICD-10-CM | POA: Diagnosis not present

## 2017-12-30 DIAGNOSIS — I330 Acute and subacute infective endocarditis: Secondary | ICD-10-CM | POA: Diagnosis not present

## 2017-12-30 DIAGNOSIS — I872 Venous insufficiency (chronic) (peripheral): Secondary | ICD-10-CM | POA: Diagnosis not present

## 2017-12-30 DIAGNOSIS — R0789 Other chest pain: Secondary | ICD-10-CM | POA: Diagnosis not present

## 2017-12-30 DIAGNOSIS — M25559 Pain in unspecified hip: Secondary | ICD-10-CM | POA: Diagnosis not present

## 2017-12-30 DIAGNOSIS — I1311 Hypertensive heart and chronic kidney disease without heart failure, with stage 5 chronic kidney disease, or end stage renal disease: Secondary | ICD-10-CM | POA: Diagnosis not present

## 2017-12-30 DIAGNOSIS — L03115 Cellulitis of right lower limb: Secondary | ICD-10-CM | POA: Diagnosis not present

## 2017-12-30 DIAGNOSIS — E1122 Type 2 diabetes mellitus with diabetic chronic kidney disease: Secondary | ICD-10-CM | POA: Diagnosis not present

## 2017-12-30 DIAGNOSIS — R7989 Other specified abnormal findings of blood chemistry: Secondary | ICD-10-CM | POA: Diagnosis not present

## 2017-12-30 DIAGNOSIS — N186 End stage renal disease: Secondary | ICD-10-CM | POA: Diagnosis not present

## 2017-12-30 DIAGNOSIS — R6520 Severe sepsis without septic shock: Secondary | ICD-10-CM | POA: Diagnosis not present

## 2017-12-30 DIAGNOSIS — I214 Non-ST elevation (NSTEMI) myocardial infarction: Secondary | ICD-10-CM | POA: Diagnosis not present

## 2017-12-30 DIAGNOSIS — M6281 Muscle weakness (generalized): Secondary | ICD-10-CM | POA: Diagnosis not present

## 2017-12-30 DIAGNOSIS — G8911 Acute pain due to trauma: Secondary | ICD-10-CM | POA: Diagnosis not present

## 2017-12-30 DIAGNOSIS — R000 Tachycardia, unspecified: Secondary | ICD-10-CM | POA: Diagnosis not present

## 2017-12-30 DIAGNOSIS — J189 Pneumonia, unspecified organism: Secondary | ICD-10-CM | POA: Diagnosis not present

## 2017-12-30 DIAGNOSIS — A419 Sepsis, unspecified organism: Secondary | ICD-10-CM | POA: Diagnosis not present

## 2017-12-30 DIAGNOSIS — M25552 Pain in left hip: Secondary | ICD-10-CM | POA: Diagnosis not present

## 2017-12-31 DIAGNOSIS — I82442 Acute embolism and thrombosis of left tibial vein: Secondary | ICD-10-CM | POA: Diagnosis not present

## 2017-12-31 DIAGNOSIS — G4733 Obstructive sleep apnea (adult) (pediatric): Secondary | ICD-10-CM | POA: Diagnosis present

## 2017-12-31 DIAGNOSIS — G8929 Other chronic pain: Secondary | ICD-10-CM | POA: Diagnosis present

## 2017-12-31 DIAGNOSIS — I2721 Secondary pulmonary arterial hypertension: Secondary | ICD-10-CM | POA: Diagnosis not present

## 2017-12-31 DIAGNOSIS — K219 Gastro-esophageal reflux disease without esophagitis: Secondary | ICD-10-CM | POA: Diagnosis present

## 2017-12-31 DIAGNOSIS — I872 Venous insufficiency (chronic) (peripheral): Secondary | ICD-10-CM | POA: Diagnosis not present

## 2017-12-31 DIAGNOSIS — F29 Unspecified psychosis not due to a substance or known physiological condition: Secondary | ICD-10-CM | POA: Diagnosis not present

## 2017-12-31 DIAGNOSIS — Z992 Dependence on renal dialysis: Secondary | ICD-10-CM | POA: Diagnosis not present

## 2017-12-31 DIAGNOSIS — I82723 Chronic embolism and thrombosis of deep veins of upper extremity, bilateral: Secondary | ICD-10-CM | POA: Diagnosis not present

## 2017-12-31 DIAGNOSIS — R079 Chest pain, unspecified: Secondary | ICD-10-CM | POA: Diagnosis not present

## 2017-12-31 DIAGNOSIS — I10 Essential (primary) hypertension: Secondary | ICD-10-CM | POA: Diagnosis not present

## 2017-12-31 DIAGNOSIS — M1990 Unspecified osteoarthritis, unspecified site: Secondary | ICD-10-CM | POA: Diagnosis present

## 2017-12-31 DIAGNOSIS — I871 Compression of vein: Secondary | ICD-10-CM | POA: Diagnosis not present

## 2017-12-31 DIAGNOSIS — A4189 Other specified sepsis: Secondary | ICD-10-CM | POA: Diagnosis not present

## 2017-12-31 DIAGNOSIS — I959 Hypotension, unspecified: Secondary | ICD-10-CM | POA: Diagnosis not present

## 2017-12-31 DIAGNOSIS — R748 Abnormal levels of other serum enzymes: Secondary | ICD-10-CM | POA: Diagnosis not present

## 2017-12-31 DIAGNOSIS — R1310 Dysphagia, unspecified: Secondary | ICD-10-CM | POA: Diagnosis not present

## 2017-12-31 DIAGNOSIS — D631 Anemia in chronic kidney disease: Secondary | ICD-10-CM | POA: Diagnosis present

## 2017-12-31 DIAGNOSIS — I6789 Other cerebrovascular disease: Secondary | ICD-10-CM | POA: Diagnosis not present

## 2017-12-31 DIAGNOSIS — I120 Hypertensive chronic kidney disease with stage 5 chronic kidney disease or end stage renal disease: Secondary | ICD-10-CM | POA: Diagnosis present

## 2017-12-31 DIAGNOSIS — T8249XA Other complication of vascular dialysis catheter, initial encounter: Secondary | ICD-10-CM | POA: Diagnosis not present

## 2017-12-31 DIAGNOSIS — R531 Weakness: Secondary | ICD-10-CM | POA: Diagnosis not present

## 2017-12-31 DIAGNOSIS — N200 Calculus of kidney: Secondary | ICD-10-CM | POA: Diagnosis not present

## 2017-12-31 DIAGNOSIS — A412 Sepsis due to unspecified staphylococcus: Secondary | ICD-10-CM | POA: Diagnosis not present

## 2017-12-31 DIAGNOSIS — N189 Chronic kidney disease, unspecified: Secondary | ICD-10-CM | POA: Diagnosis not present

## 2017-12-31 DIAGNOSIS — I38 Endocarditis, valve unspecified: Secondary | ICD-10-CM | POA: Diagnosis not present

## 2017-12-31 DIAGNOSIS — I214 Non-ST elevation (NSTEMI) myocardial infarction: Secondary | ICD-10-CM | POA: Diagnosis present

## 2017-12-31 DIAGNOSIS — Z6841 Body Mass Index (BMI) 40.0 and over, adult: Secondary | ICD-10-CM | POA: Diagnosis not present

## 2017-12-31 DIAGNOSIS — E1122 Type 2 diabetes mellitus with diabetic chronic kidney disease: Secondary | ICD-10-CM | POA: Diagnosis present

## 2017-12-31 DIAGNOSIS — R7989 Other specified abnormal findings of blood chemistry: Secondary | ICD-10-CM | POA: Diagnosis not present

## 2017-12-31 DIAGNOSIS — E1140 Type 2 diabetes mellitus with diabetic neuropathy, unspecified: Secondary | ICD-10-CM | POA: Diagnosis present

## 2017-12-31 DIAGNOSIS — R4182 Altered mental status, unspecified: Secondary | ICD-10-CM | POA: Diagnosis not present

## 2017-12-31 DIAGNOSIS — I5032 Chronic diastolic (congestive) heart failure: Secondary | ICD-10-CM | POA: Diagnosis not present

## 2017-12-31 DIAGNOSIS — I472 Ventricular tachycardia: Secondary | ICD-10-CM | POA: Diagnosis not present

## 2017-12-31 DIAGNOSIS — R410 Disorientation, unspecified: Secondary | ICD-10-CM | POA: Diagnosis not present

## 2017-12-31 DIAGNOSIS — I509 Heart failure, unspecified: Secondary | ICD-10-CM | POA: Diagnosis not present

## 2017-12-31 DIAGNOSIS — R6521 Severe sepsis with septic shock: Secondary | ICD-10-CM | POA: Diagnosis not present

## 2017-12-31 DIAGNOSIS — R7881 Bacteremia: Secondary | ICD-10-CM | POA: Diagnosis not present

## 2017-12-31 DIAGNOSIS — R6520 Severe sepsis without septic shock: Secondary | ICD-10-CM | POA: Diagnosis not present

## 2017-12-31 DIAGNOSIS — M109 Gout, unspecified: Secondary | ICD-10-CM | POA: Diagnosis present

## 2017-12-31 DIAGNOSIS — N186 End stage renal disease: Secondary | ICD-10-CM | POA: Diagnosis not present

## 2017-12-31 DIAGNOSIS — I330 Acute and subacute infective endocarditis: Secondary | ICD-10-CM | POA: Diagnosis present

## 2017-12-31 DIAGNOSIS — B957 Other staphylococcus as the cause of diseases classified elsewhere: Secondary | ICD-10-CM | POA: Diagnosis not present

## 2017-12-31 DIAGNOSIS — I82402 Acute embolism and thrombosis of unspecified deep veins of left lower extremity: Secondary | ICD-10-CM | POA: Diagnosis not present

## 2017-12-31 DIAGNOSIS — E785 Hyperlipidemia, unspecified: Secondary | ICD-10-CM | POA: Diagnosis present

## 2017-12-31 DIAGNOSIS — G4730 Sleep apnea, unspecified: Secondary | ICD-10-CM | POA: Diagnosis not present

## 2017-12-31 DIAGNOSIS — I825Y3 Chronic embolism and thrombosis of unspecified deep veins of proximal lower extremity, bilateral: Secondary | ICD-10-CM | POA: Diagnosis not present

## 2017-12-31 DIAGNOSIS — R000 Tachycardia, unspecified: Secondary | ICD-10-CM | POA: Diagnosis not present

## 2017-12-31 DIAGNOSIS — I82503 Chronic embolism and thrombosis of unspecified deep veins of lower extremity, bilateral: Secondary | ICD-10-CM | POA: Diagnosis not present

## 2017-12-31 DIAGNOSIS — I82411 Acute embolism and thrombosis of right femoral vein: Secondary | ICD-10-CM | POA: Diagnosis not present

## 2017-12-31 DIAGNOSIS — A419 Sepsis, unspecified organism: Secondary | ICD-10-CM | POA: Diagnosis not present

## 2017-12-31 DIAGNOSIS — E119 Type 2 diabetes mellitus without complications: Secondary | ICD-10-CM | POA: Diagnosis not present

## 2017-12-31 DIAGNOSIS — J189 Pneumonia, unspecified organism: Secondary | ICD-10-CM | POA: Diagnosis not present

## 2018-01-24 DIAGNOSIS — N186 End stage renal disease: Secondary | ICD-10-CM | POA: Diagnosis not present

## 2018-01-24 DIAGNOSIS — E1129 Type 2 diabetes mellitus with other diabetic kidney complication: Secondary | ICD-10-CM | POA: Diagnosis not present

## 2018-01-24 DIAGNOSIS — R531 Weakness: Secondary | ICD-10-CM | POA: Diagnosis not present

## 2018-01-24 DIAGNOSIS — Z992 Dependence on renal dialysis: Secondary | ICD-10-CM | POA: Diagnosis not present

## 2018-01-24 DIAGNOSIS — D509 Iron deficiency anemia, unspecified: Secondary | ICD-10-CM | POA: Diagnosis not present

## 2018-01-24 DIAGNOSIS — D631 Anemia in chronic kidney disease: Secondary | ICD-10-CM | POA: Diagnosis not present

## 2018-01-24 DIAGNOSIS — M6281 Muscle weakness (generalized): Secondary | ICD-10-CM | POA: Diagnosis not present

## 2018-01-24 DIAGNOSIS — E213 Hyperparathyroidism, unspecified: Secondary | ICD-10-CM | POA: Diagnosis not present

## 2018-01-25 DIAGNOSIS — I429 Cardiomyopathy, unspecified: Secondary | ICD-10-CM | POA: Diagnosis not present

## 2018-01-25 DIAGNOSIS — E11649 Type 2 diabetes mellitus with hypoglycemia without coma: Secondary | ICD-10-CM | POA: Diagnosis not present

## 2018-01-25 DIAGNOSIS — Z6841 Body Mass Index (BMI) 40.0 and over, adult: Secondary | ICD-10-CM | POA: Diagnosis not present

## 2018-01-25 DIAGNOSIS — I132 Hypertensive heart and chronic kidney disease with heart failure and with stage 5 chronic kidney disease, or end stage renal disease: Secondary | ICD-10-CM | POA: Diagnosis not present

## 2018-01-25 DIAGNOSIS — J811 Chronic pulmonary edema: Secondary | ICD-10-CM | POA: Diagnosis not present

## 2018-01-25 DIAGNOSIS — N186 End stage renal disease: Secondary | ICD-10-CM | POA: Diagnosis not present

## 2018-01-25 DIAGNOSIS — E46 Unspecified protein-calorie malnutrition: Secondary | ICD-10-CM | POA: Diagnosis not present

## 2018-01-25 DIAGNOSIS — R0789 Other chest pain: Secondary | ICD-10-CM | POA: Diagnosis not present

## 2018-01-25 DIAGNOSIS — E860 Dehydration: Secondary | ICD-10-CM | POA: Diagnosis not present

## 2018-01-25 DIAGNOSIS — R000 Tachycardia, unspecified: Secondary | ICD-10-CM | POA: Diagnosis not present

## 2018-01-25 DIAGNOSIS — Z992 Dependence on renal dialysis: Secondary | ICD-10-CM | POA: Diagnosis not present

## 2018-01-25 DIAGNOSIS — R4182 Altered mental status, unspecified: Secondary | ICD-10-CM | POA: Diagnosis not present

## 2018-01-25 DIAGNOSIS — I120 Hypertensive chronic kidney disease with stage 5 chronic kidney disease or end stage renal disease: Secondary | ICD-10-CM | POA: Diagnosis not present

## 2018-01-25 DIAGNOSIS — E162 Hypoglycemia, unspecified: Secondary | ICD-10-CM | POA: Diagnosis not present

## 2018-01-25 DIAGNOSIS — I339 Acute and subacute endocarditis, unspecified: Secondary | ICD-10-CM | POA: Diagnosis not present

## 2018-01-26 DIAGNOSIS — Z794 Long term (current) use of insulin: Secondary | ICD-10-CM | POA: Diagnosis not present

## 2018-01-26 DIAGNOSIS — I272 Pulmonary hypertension, unspecified: Secondary | ICD-10-CM | POA: Diagnosis present

## 2018-01-26 DIAGNOSIS — I10 Essential (primary) hypertension: Secondary | ICD-10-CM | POA: Diagnosis not present

## 2018-01-26 DIAGNOSIS — I132 Hypertensive heart and chronic kidney disease with heart failure and with stage 5 chronic kidney disease, or end stage renal disease: Secondary | ICD-10-CM | POA: Diagnosis not present

## 2018-01-26 DIAGNOSIS — A4189 Other specified sepsis: Secondary | ICD-10-CM | POA: Diagnosis not present

## 2018-01-26 DIAGNOSIS — Z9119 Patient's noncompliance with other medical treatment and regimen: Secondary | ICD-10-CM | POA: Diagnosis not present

## 2018-01-26 DIAGNOSIS — R9082 White matter disease, unspecified: Secondary | ICD-10-CM | POA: Diagnosis not present

## 2018-01-26 DIAGNOSIS — E1165 Type 2 diabetes mellitus with hyperglycemia: Secondary | ICD-10-CM | POA: Diagnosis not present

## 2018-01-26 DIAGNOSIS — M1991 Primary osteoarthritis, unspecified site: Secondary | ICD-10-CM | POA: Diagnosis present

## 2018-01-26 DIAGNOSIS — I358 Other nonrheumatic aortic valve disorders: Secondary | ICD-10-CM | POA: Diagnosis present

## 2018-01-26 DIAGNOSIS — E1122 Type 2 diabetes mellitus with diabetic chronic kidney disease: Secondary | ICD-10-CM | POA: Diagnosis not present

## 2018-01-26 DIAGNOSIS — N186 End stage renal disease: Secondary | ICD-10-CM | POA: Diagnosis not present

## 2018-01-26 DIAGNOSIS — D649 Anemia, unspecified: Secondary | ICD-10-CM | POA: Diagnosis not present

## 2018-01-26 DIAGNOSIS — Z792 Long term (current) use of antibiotics: Secondary | ICD-10-CM | POA: Diagnosis not present

## 2018-01-26 DIAGNOSIS — I2721 Secondary pulmonary arterial hypertension: Secondary | ICD-10-CM | POA: Diagnosis not present

## 2018-01-26 DIAGNOSIS — I339 Acute and subacute endocarditis, unspecified: Secondary | ICD-10-CM | POA: Diagnosis not present

## 2018-01-26 DIAGNOSIS — Z5181 Encounter for therapeutic drug level monitoring: Secondary | ICD-10-CM | POA: Diagnosis not present

## 2018-01-26 DIAGNOSIS — R4182 Altered mental status, unspecified: Secondary | ICD-10-CM | POA: Diagnosis not present

## 2018-01-26 DIAGNOSIS — I129 Hypertensive chronic kidney disease with stage 1 through stage 4 chronic kidney disease, or unspecified chronic kidney disease: Secondary | ICD-10-CM | POA: Diagnosis not present

## 2018-01-26 DIAGNOSIS — I4510 Unspecified right bundle-branch block: Secondary | ICD-10-CM | POA: Diagnosis present

## 2018-01-26 DIAGNOSIS — I361 Nonrheumatic tricuspid (valve) insufficiency: Secondary | ICD-10-CM | POA: Diagnosis not present

## 2018-01-26 DIAGNOSIS — R531 Weakness: Secondary | ICD-10-CM | POA: Diagnosis not present

## 2018-01-26 DIAGNOSIS — I82723 Chronic embolism and thrombosis of deep veins of upper extremity, bilateral: Secondary | ICD-10-CM | POA: Diagnosis not present

## 2018-01-26 DIAGNOSIS — Z86718 Personal history of other venous thrombosis and embolism: Secondary | ICD-10-CM | POA: Diagnosis not present

## 2018-01-26 DIAGNOSIS — E860 Dehydration: Secondary | ICD-10-CM | POA: Diagnosis present

## 2018-01-26 DIAGNOSIS — B957 Other staphylococcus as the cause of diseases classified elsewhere: Secondary | ICD-10-CM | POA: Diagnosis not present

## 2018-01-26 DIAGNOSIS — E1142 Type 2 diabetes mellitus with diabetic polyneuropathy: Secondary | ICD-10-CM | POA: Diagnosis not present

## 2018-01-26 DIAGNOSIS — I429 Cardiomyopathy, unspecified: Secondary | ICD-10-CM | POA: Diagnosis not present

## 2018-01-26 DIAGNOSIS — R7881 Bacteremia: Secondary | ICD-10-CM | POA: Diagnosis not present

## 2018-01-26 DIAGNOSIS — Z992 Dependence on renal dialysis: Secondary | ICD-10-CM | POA: Diagnosis not present

## 2018-01-26 DIAGNOSIS — R748 Abnormal levels of other serum enzymes: Secondary | ICD-10-CM | POA: Diagnosis not present

## 2018-01-26 DIAGNOSIS — Z7401 Bed confinement status: Secondary | ICD-10-CM | POA: Diagnosis not present

## 2018-01-26 DIAGNOSIS — I351 Nonrheumatic aortic (valve) insufficiency: Secondary | ICD-10-CM | POA: Diagnosis not present

## 2018-01-26 DIAGNOSIS — Z6841 Body Mass Index (BMI) 40.0 and over, adult: Secondary | ICD-10-CM | POA: Diagnosis not present

## 2018-01-26 DIAGNOSIS — E46 Unspecified protein-calorie malnutrition: Secondary | ICD-10-CM | POA: Diagnosis not present

## 2018-01-26 DIAGNOSIS — R55 Syncope and collapse: Secondary | ICD-10-CM | POA: Diagnosis not present

## 2018-01-26 DIAGNOSIS — I340 Nonrheumatic mitral (valve) insufficiency: Secondary | ICD-10-CM | POA: Diagnosis not present

## 2018-01-26 DIAGNOSIS — R079 Chest pain, unspecified: Secondary | ICD-10-CM | POA: Diagnosis not present

## 2018-01-26 DIAGNOSIS — I2510 Atherosclerotic heart disease of native coronary artery without angina pectoris: Secondary | ICD-10-CM | POA: Diagnosis present

## 2018-01-26 DIAGNOSIS — G4733 Obstructive sleep apnea (adult) (pediatric): Secondary | ICD-10-CM | POA: Diagnosis present

## 2018-01-26 DIAGNOSIS — E162 Hypoglycemia, unspecified: Secondary | ICD-10-CM | POA: Diagnosis not present

## 2018-01-26 DIAGNOSIS — I330 Acute and subacute infective endocarditis: Secondary | ICD-10-CM | POA: Diagnosis not present

## 2018-01-26 DIAGNOSIS — E11649 Type 2 diabetes mellitus with hypoglycemia without coma: Secondary | ICD-10-CM | POA: Diagnosis not present

## 2018-01-26 DIAGNOSIS — R9089 Other abnormal findings on diagnostic imaging of central nervous system: Secondary | ICD-10-CM | POA: Diagnosis not present

## 2018-02-02 DIAGNOSIS — N186 End stage renal disease: Secondary | ICD-10-CM | POA: Diagnosis not present

## 2018-02-02 DIAGNOSIS — E213 Hyperparathyroidism, unspecified: Secondary | ICD-10-CM | POA: Diagnosis not present

## 2018-02-02 DIAGNOSIS — D509 Iron deficiency anemia, unspecified: Secondary | ICD-10-CM | POA: Diagnosis not present

## 2018-02-02 DIAGNOSIS — R7881 Bacteremia: Secondary | ICD-10-CM | POA: Diagnosis not present

## 2018-02-02 DIAGNOSIS — M6281 Muscle weakness (generalized): Secondary | ICD-10-CM | POA: Diagnosis not present

## 2018-02-02 DIAGNOSIS — Z992 Dependence on renal dialysis: Secondary | ICD-10-CM | POA: Diagnosis not present

## 2018-03-05 DIAGNOSIS — 419620001 Death: Secondary | SNOMED CT | POA: Diagnosis not present

## 2018-03-05 DEATH — deceased
